# Patient Record
Sex: Female | Born: 1945 | Race: White | Hispanic: No | Smoking: Former smoker
Health system: Southern US, Community
[De-identification: ages and names within clinical notes are randomized; demographics above are authoritative.]

## PROBLEM LIST (undated history)

## (undated) DIAGNOSIS — Z8701 Personal history of pneumonia (recurrent): Secondary | ICD-10-CM

## (undated) DIAGNOSIS — J449 Chronic obstructive pulmonary disease, unspecified: Secondary | ICD-10-CM

## (undated) DIAGNOSIS — K5790 Diverticulosis of intestine, part unspecified, without perforation or abscess without bleeding: Secondary | ICD-10-CM

## (undated) DIAGNOSIS — F419 Anxiety disorder, unspecified: Secondary | ICD-10-CM

## (undated) DIAGNOSIS — K5792 Diverticulitis of intestine, part unspecified, without perforation or abscess without bleeding: Secondary | ICD-10-CM

## (undated) DIAGNOSIS — J439 Emphysema, unspecified: Secondary | ICD-10-CM

## (undated) DIAGNOSIS — K8020 Calculus of gallbladder without cholecystitis without obstruction: Secondary | ICD-10-CM

## (undated) DIAGNOSIS — G4733 Obstructive sleep apnea (adult) (pediatric): Secondary | ICD-10-CM

## (undated) DIAGNOSIS — K635 Polyp of colon: Secondary | ICD-10-CM

## (undated) DIAGNOSIS — G8929 Other chronic pain: Secondary | ICD-10-CM

## (undated) DIAGNOSIS — E7800 Pure hypercholesterolemia, unspecified: Secondary | ICD-10-CM

## (undated) DIAGNOSIS — I10 Essential (primary) hypertension: Secondary | ICD-10-CM

## (undated) DIAGNOSIS — K219 Gastro-esophageal reflux disease without esophagitis: Secondary | ICD-10-CM

## (undated) DIAGNOSIS — J40 Bronchitis, not specified as acute or chronic: Secondary | ICD-10-CM

## (undated) HISTORY — PX: COLONOSCOPY: SHX5424

## (undated) HISTORY — PX: CARPAL TUNNEL RELEASE: SHX101

## (undated) HISTORY — DX: Personal history of pneumonia (recurrent): Z87.01

## (undated) HISTORY — PX: CHOLECYSTECTOMY: SHX55

## (undated) HISTORY — PX: OTHER SURGICAL HISTORY: SHX169

## (undated) HISTORY — PX: APPENDECTOMY: SHX54

## (undated) HISTORY — DX: Diverticulitis of intestine, part unspecified, without perforation or abscess without bleeding: K57.92

## (undated) HISTORY — DX: Emphysema, unspecified: J43.9

## (undated) HISTORY — DX: Pure hypercholesterolemia, unspecified: E78.00

## (undated) HISTORY — DX: Polyp of colon: K63.5

## (undated) HISTORY — PX: ROTATOR CUFF REPAIR: SHX139

## (undated) HISTORY — DX: Calculus of gallbladder without cholecystitis without obstruction: K80.20

## (undated) HISTORY — DX: Obstructive sleep apnea (adult) (pediatric): G47.33

## (undated) HISTORY — DX: Diverticulosis of intestine, part unspecified, without perforation or abscess without bleeding: K57.90

## (undated) HISTORY — PX: FRACTURE SURGERY: SHX138

## (undated) HISTORY — PX: CATARACT EXTRACTION: SUR2

## (undated) HISTORY — PX: ESOPHAGOGASTRODUODENOSCOPY: SHX1529

## (undated) HISTORY — PX: ABDOMINAL HYSTERECTOMY: SHX81

## (undated) HISTORY — DX: Gastro-esophageal reflux disease without esophagitis: K21.9

---

## 1998-06-09 ENCOUNTER — Emergency Department (HOSPITAL_COMMUNITY): Admission: EM | Admit: 1998-06-09 | Discharge: 1998-06-09 | Payer: Self-pay | Admitting: Emergency Medicine

## 1998-08-10 ENCOUNTER — Ambulatory Visit (HOSPITAL_COMMUNITY): Admission: RE | Admit: 1998-08-10 | Discharge: 1998-08-10 | Payer: Self-pay | Admitting: Sports Medicine

## 1998-08-10 ENCOUNTER — Encounter: Payer: Self-pay | Admitting: Sports Medicine

## 1998-12-19 ENCOUNTER — Ambulatory Visit (HOSPITAL_COMMUNITY): Admission: RE | Admit: 1998-12-19 | Discharge: 1998-12-19 | Payer: Self-pay | Admitting: Sports Medicine

## 1998-12-20 ENCOUNTER — Ambulatory Visit (HOSPITAL_COMMUNITY): Admission: RE | Admit: 1998-12-20 | Discharge: 1998-12-20 | Payer: Self-pay | Admitting: Sports Medicine

## 1998-12-20 ENCOUNTER — Encounter: Payer: Self-pay | Admitting: Sports Medicine

## 1999-05-03 ENCOUNTER — Ambulatory Visit (HOSPITAL_BASED_OUTPATIENT_CLINIC_OR_DEPARTMENT_OTHER): Admission: RE | Admit: 1999-05-03 | Discharge: 1999-05-03 | Payer: Self-pay | Admitting: Orthopedic Surgery

## 1999-05-10 ENCOUNTER — Encounter: Admission: RE | Admit: 1999-05-10 | Discharge: 1999-08-08 | Payer: Self-pay | Admitting: Orthopedic Surgery

## 1999-08-05 ENCOUNTER — Encounter: Admission: RE | Admit: 1999-08-05 | Discharge: 1999-11-03 | Payer: Self-pay | Admitting: Orthopedic Surgery

## 1999-08-22 ENCOUNTER — Encounter: Payer: Self-pay | Admitting: Orthopedic Surgery

## 1999-08-22 ENCOUNTER — Ambulatory Visit (HOSPITAL_COMMUNITY): Admission: RE | Admit: 1999-08-22 | Discharge: 1999-08-22 | Payer: Self-pay | Admitting: Orthopedic Surgery

## 1999-11-22 ENCOUNTER — Encounter: Admission: RE | Admit: 1999-11-22 | Discharge: 1999-12-20 | Payer: Self-pay | Admitting: Orthopedic Surgery

## 2000-09-01 ENCOUNTER — Emergency Department (HOSPITAL_COMMUNITY): Admission: EM | Admit: 2000-09-01 | Discharge: 2000-09-01 | Payer: Self-pay | Admitting: Emergency Medicine

## 2000-09-04 ENCOUNTER — Emergency Department (HOSPITAL_COMMUNITY): Admission: EM | Admit: 2000-09-04 | Discharge: 2000-09-04 | Payer: Self-pay | Admitting: Emergency Medicine

## 2002-04-12 ENCOUNTER — Encounter: Payer: Self-pay | Admitting: Emergency Medicine

## 2002-04-12 ENCOUNTER — Inpatient Hospital Stay (HOSPITAL_COMMUNITY): Admission: EM | Admit: 2002-04-12 | Discharge: 2002-04-17 | Payer: Self-pay | Admitting: Emergency Medicine

## 2002-04-15 ENCOUNTER — Encounter: Payer: Self-pay | Admitting: Internal Medicine

## 2002-04-16 ENCOUNTER — Encounter: Payer: Self-pay | Admitting: Internal Medicine

## 2002-04-24 ENCOUNTER — Encounter: Payer: Self-pay | Admitting: Emergency Medicine

## 2002-04-24 ENCOUNTER — Emergency Department (HOSPITAL_COMMUNITY): Admission: EM | Admit: 2002-04-24 | Discharge: 2002-04-25 | Payer: Self-pay | Admitting: Emergency Medicine

## 2008-10-02 DIAGNOSIS — G4733 Obstructive sleep apnea (adult) (pediatric): Secondary | ICD-10-CM | POA: Insufficient documentation

## 2011-12-14 ENCOUNTER — Inpatient Hospital Stay (HOSPITAL_COMMUNITY)
Admission: EM | Admit: 2011-12-14 | Discharge: 2011-12-18 | DRG: 190 | Disposition: A | Discharge status: Home / Self Care | Payer: Medicaid Other | Attending: Internal Medicine | Admitting: Internal Medicine

## 2011-12-14 ENCOUNTER — Emergency Department (HOSPITAL_COMMUNITY): Payer: Medicaid Other

## 2011-12-14 ENCOUNTER — Encounter (HOSPITAL_COMMUNITY): Payer: Self-pay

## 2011-12-14 ENCOUNTER — Other Ambulatory Visit: Payer: Self-pay

## 2011-12-14 DIAGNOSIS — J9600 Acute respiratory failure, unspecified whether with hypoxia or hypercapnia: Secondary | ICD-10-CM | POA: Diagnosis present

## 2011-12-14 DIAGNOSIS — J40 Bronchitis, not specified as acute or chronic: Secondary | ICD-10-CM | POA: Diagnosis present

## 2011-12-14 DIAGNOSIS — Z9660 Presence of unspecified orthopedic joint implant: Secondary | ICD-10-CM

## 2011-12-14 DIAGNOSIS — J441 Chronic obstructive pulmonary disease with (acute) exacerbation: Secondary | ICD-10-CM

## 2011-12-14 DIAGNOSIS — R0902 Hypoxemia: Secondary | ICD-10-CM

## 2011-12-14 DIAGNOSIS — F411 Generalized anxiety disorder: Secondary | ICD-10-CM | POA: Diagnosis present

## 2011-12-14 DIAGNOSIS — I10 Essential (primary) hypertension: Secondary | ICD-10-CM | POA: Diagnosis present

## 2011-12-14 DIAGNOSIS — D72829 Elevated white blood cell count, unspecified: Secondary | ICD-10-CM | POA: Diagnosis present

## 2011-12-14 DIAGNOSIS — E119 Type 2 diabetes mellitus without complications: Secondary | ICD-10-CM | POA: Diagnosis present

## 2011-12-14 DIAGNOSIS — F17200 Nicotine dependence, unspecified, uncomplicated: Secondary | ICD-10-CM | POA: Diagnosis present

## 2011-12-14 DIAGNOSIS — J479 Bronchiectasis, uncomplicated: Secondary | ICD-10-CM | POA: Diagnosis present

## 2011-12-14 DIAGNOSIS — G8929 Other chronic pain: Secondary | ICD-10-CM | POA: Diagnosis present

## 2011-12-14 HISTORY — DX: Other chronic pain: G89.29

## 2011-12-14 HISTORY — DX: Anxiety disorder, unspecified: F41.9

## 2011-12-14 HISTORY — DX: Chronic obstructive pulmonary disease, unspecified: J44.9

## 2011-12-14 HISTORY — DX: Essential (primary) hypertension: I10

## 2011-12-14 LAB — BASIC METABOLIC PANEL
BUN: 11 mg/dL (ref 6–23)
CO2: 27 mEq/L (ref 19–32)
Calcium: 9.1 mg/dL (ref 8.4–10.5)
Creatinine, Ser: 0.81 mg/dL (ref 0.50–1.10)
Glucose, Bld: 100 mg/dL — ABNORMAL HIGH (ref 70–99)

## 2011-12-14 LAB — CBC
MCH: 29.9 pg (ref 26.0–34.0)
MCHC: 33.8 g/dL (ref 30.0–36.0)
MCV: 88.7 fL (ref 78.0–100.0)
Platelets: 201 10*3/uL (ref 150–400)
RBC: 5.31 MIL/uL — ABNORMAL HIGH (ref 3.87–5.11)
RDW: 13 % (ref 11.5–15.5)

## 2011-12-14 LAB — CARDIAC PANEL(CRET KIN+CKTOT+MB+TROPI): Total CK: 57 U/L (ref 7–177)

## 2011-12-14 LAB — DIFFERENTIAL
Basophils Relative: 0 % (ref 0–1)
Eosinophils Absolute: 0.3 10*3/uL (ref 0.0–0.7)
Eosinophils Relative: 2 % (ref 0–5)
Lymphs Abs: 2.6 10*3/uL (ref 0.7–4.0)

## 2011-12-14 MED ORDER — ACETAMINOPHEN 650 MG RE SUPP: 650.0000 mg | Freq: Four times a day (QID) | RECTAL | Status: DC | PRN

## 2011-12-14 MED ORDER — ALBUTEROL SULFATE HFA 108 (90 BASE) MCG/ACT IN AERS
2.0000 | INHALATION_SPRAY | Freq: Four times a day (QID) | RESPIRATORY_TRACT | Status: DC | PRN
  Filled 2011-12-14: qty 6.7

## 2011-12-14 MED ORDER — ONDANSETRON HCL 4 MG/2ML IJ SOLN: 4.0000 mg | Freq: Four times a day (QID) | INTRAMUSCULAR | Status: DC | PRN

## 2011-12-14 MED ORDER — ACETAMINOPHEN 325 MG PO TABS: 650.0000 mg | ORAL_TABLET | Freq: Four times a day (QID) | ORAL | Status: DC | PRN

## 2011-12-14 MED ORDER — ALBUTEROL SULFATE (5 MG/ML) 0.5% IN NEBU
10.0000 mg | INHALATION_SOLUTION | Freq: Once | RESPIRATORY_TRACT | Status: AC
  Administered 2011-12-14: 10 mg via RESPIRATORY_TRACT
  Filled 2011-12-14: qty 2

## 2011-12-14 MED ORDER — SODIUM CHLORIDE 0.9 % IV BOLUS (SEPSIS)
1000.0000 mL | Freq: Once | INTRAVENOUS | Status: AC
  Administered 2011-12-14: 1000 mL via INTRAVENOUS

## 2011-12-14 MED ORDER — SODIUM CHLORIDE 0.9 % IV SOLN
INTRAVENOUS | Status: DC
  Administered 2011-12-14: 19:00:00 via INTRAVENOUS

## 2011-12-14 MED ORDER — PNEUMOCOCCAL VAC POLYVALENT 25 MCG/0.5ML IJ INJ
0.5000 mL | INJECTION | INTRAMUSCULAR | Status: AC
  Administered 2011-12-15: 0.5 mL via INTRAMUSCULAR
  Filled 2011-12-14 (×2): qty 0.5

## 2011-12-14 MED ORDER — INSULIN ASPART 100 UNIT/ML ~~LOC~~ SOLN
0.0000 [IU] | Freq: Three times a day (TID) | SUBCUTANEOUS | Status: DC
  Administered 2011-12-15: 5 [IU] via SUBCUTANEOUS
  Administered 2011-12-15: 8 [IU] via SUBCUTANEOUS
  Administered 2011-12-15: 5 [IU] via SUBCUTANEOUS
  Administered 2011-12-16: 3 [IU] via SUBCUTANEOUS
  Administered 2011-12-16: 5 [IU] via SUBCUTANEOUS
  Administered 2011-12-16: 3 [IU] via SUBCUTANEOUS
  Administered 2011-12-17: 5 [IU] via SUBCUTANEOUS
  Administered 2011-12-17: 12 [IU] via SUBCUTANEOUS
  Administered 2011-12-18: 2 [IU] via SUBCUTANEOUS
  Administered 2011-12-18: 3 [IU] via SUBCUTANEOUS

## 2011-12-14 MED ORDER — HYDROCODONE-ACETAMINOPHEN 5-325 MG PO TABS
1.0000 | ORAL_TABLET | ORAL | Status: DC | PRN
  Administered 2011-12-15 (×2): 2 via ORAL
  Administered 2011-12-15: 1 via ORAL
  Filled 2011-12-14: qty 2
  Filled 2011-12-14: qty 1
  Filled 2011-12-14: qty 2

## 2011-12-14 MED ORDER — HYDROMORPHONE HCL PF 1 MG/ML IJ SOLN
1.0000 mg | Freq: Once | INTRAMUSCULAR | Status: AC
  Administered 2011-12-14: 1 mg via INTRAVENOUS
  Filled 2011-12-14: qty 1

## 2011-12-14 MED ORDER — INFLUENZA VIRUS VACC SPLIT PF IM SUSP
0.5000 mL | INTRAMUSCULAR | Status: AC
  Administered 2011-12-15: 0.5 mL via INTRAMUSCULAR
  Filled 2011-12-14 (×2): qty 0.5

## 2011-12-14 MED ORDER — ONDANSETRON HCL 4 MG/2ML IJ SOLN
4.0000 mg | Freq: Once | INTRAMUSCULAR | Status: AC
  Administered 2011-12-14: 4 mg via INTRAVENOUS
  Filled 2011-12-14: qty 2

## 2011-12-14 MED ORDER — MOXIFLOXACIN HCL IN NACL 400 MG/250ML IV SOLN
400.0000 mg | INTRAVENOUS | Status: DC
  Administered 2011-12-14 – 2011-12-15 (×2): 400 mg via INTRAVENOUS
  Filled 2011-12-14 (×3): qty 250

## 2011-12-14 MED ORDER — LISINOPRIL 20 MG PO TABS
20.0000 mg | ORAL_TABLET | Freq: Every day | ORAL | Status: DC
  Administered 2011-12-14 – 2011-12-18 (×5): 20 mg via ORAL
  Filled 2011-12-14 (×5): qty 1

## 2011-12-14 MED ORDER — ATORVASTATIN CALCIUM 10 MG PO TABS
10.0000 mg | ORAL_TABLET | Freq: Every day | ORAL | Status: DC
  Administered 2011-12-15 – 2011-12-17 (×3): 10 mg via ORAL
  Filled 2011-12-14 (×4): qty 1

## 2011-12-14 MED ORDER — TIOTROPIUM BROMIDE MONOHYDRATE 18 MCG IN CAPS
18.0000 ug | ORAL_CAPSULE | Freq: Every day | RESPIRATORY_TRACT | Status: DC
  Administered 2011-12-15 – 2011-12-17 (×3): 18 ug via RESPIRATORY_TRACT
  Filled 2011-12-14: qty 5

## 2011-12-14 MED ORDER — HEPARIN SODIUM (PORCINE) 5000 UNIT/ML IJ SOLN
5000.0000 [IU] | Freq: Three times a day (TID) | INTRAMUSCULAR | Status: DC
  Administered 2011-12-14 – 2011-12-18 (×9): 5000 [IU] via SUBCUTANEOUS
  Filled 2011-12-14 (×14): qty 1

## 2011-12-14 MED ORDER — METHYLPREDNISOLONE SODIUM SUCC 125 MG IJ SOLR
125.0000 mg | Freq: Once | INTRAMUSCULAR | Status: AC
  Administered 2011-12-14: 125 mg via INTRAVENOUS
  Filled 2011-12-14: qty 2

## 2011-12-14 MED ORDER — ONDANSETRON HCL 4 MG PO TABS: 4.0000 mg | ORAL_TABLET | Freq: Four times a day (QID) | ORAL | Status: DC | PRN

## 2011-12-14 MED ORDER — INSULIN ASPART 100 UNIT/ML ~~LOC~~ SOLN
0.0000 [IU] | Freq: Every day | SUBCUTANEOUS | Status: DC
  Administered 2011-12-14 – 2011-12-16 (×2): 3 [IU] via SUBCUTANEOUS
  Administered 2011-12-17: 4 [IU] via SUBCUTANEOUS

## 2011-12-14 MED ORDER — SODIUM CHLORIDE 0.9 % IV SOLN
INTRAVENOUS | Status: DC
  Administered 2011-12-14 – 2011-12-16 (×3): via INTRAVENOUS

## 2011-12-14 MED ORDER — LISINOPRIL-HYDROCHLOROTHIAZIDE 20-25 MG PO TABS: 1.0000 | ORAL_TABLET | Freq: Every day | ORAL | Status: DC

## 2011-12-14 MED ORDER — ALBUTEROL SULFATE (5 MG/ML) 0.5% IN NEBU
2.5000 mg | INHALATION_SOLUTION | RESPIRATORY_TRACT | Status: DC | PRN
  Administered 2011-12-15: 2.5 mg via RESPIRATORY_TRACT
  Filled 2011-12-14: qty 0.5

## 2011-12-14 MED ORDER — HYDROCHLOROTHIAZIDE 25 MG PO TABS
25.0000 mg | ORAL_TABLET | Freq: Every day | ORAL | Status: DC
  Administered 2011-12-14 – 2011-12-18 (×5): 25 mg via ORAL
  Filled 2011-12-14 (×5): qty 1

## 2011-12-14 MED ORDER — BUDESONIDE-FORMOTEROL FUMARATE 160-4.5 MCG/ACT IN AERO
2.0000 | INHALATION_SPRAY | Freq: Two times a day (BID) | RESPIRATORY_TRACT | Status: DC
  Administered 2011-12-14 – 2011-12-18 (×8): 2 via RESPIRATORY_TRACT
  Filled 2011-12-14: qty 6

## 2011-12-14 MED ORDER — POLYETHYLENE GLYCOL 3350 17 G PO PACK
17.0000 g | PACK | Freq: Every day | ORAL | Status: DC | PRN
  Filled 2011-12-14: qty 1

## 2011-12-14 MED ORDER — INSULIN ASPART 100 UNIT/ML ~~LOC~~ SOLN
4.0000 [IU] | Freq: Three times a day (TID) | SUBCUTANEOUS | Status: DC
  Administered 2011-12-15 – 2011-12-18 (×11): 4 [IU] via SUBCUTANEOUS

## 2011-12-14 MED ORDER — INSULIN GLARGINE 100 UNIT/ML ~~LOC~~ SOLN
10.0000 [IU] | Freq: Every day | SUBCUTANEOUS | Status: DC
  Administered 2011-12-14 – 2011-12-17 (×4): 10 [IU] via SUBCUTANEOUS

## 2011-12-14 MED ORDER — ALPRAZOLAM 0.5 MG PO TABS
0.5000 mg | ORAL_TABLET | Freq: Three times a day (TID) | ORAL | Status: DC | PRN
  Administered 2011-12-14 – 2011-12-17 (×3): 0.5 mg via ORAL
  Filled 2011-12-14 (×3): qty 1

## 2011-12-14 MED ORDER — GUAIFENESIN-DM 100-10 MG/5ML PO SYRP
5.0000 mL | ORAL_SOLUTION | ORAL | Status: DC | PRN
  Filled 2011-12-14: qty 10

## 2011-12-14 MED ORDER — METHYLPREDNISOLONE SODIUM SUCC 125 MG IJ SOLR
80.0000 mg | Freq: Three times a day (TID) | INTRAMUSCULAR | Status: DC
  Administered 2011-12-14 – 2011-12-15 (×3): 80 mg via INTRAVENOUS
  Filled 2011-12-14 (×5): qty 1.28

## 2011-12-14 NOTE — ED Notes (Signed)
EKG done in [place]

## 2011-12-14 NOTE — H&P (Signed)
Hospital Admission Note Date: 12/14/2011  PCP: No primary provider on file.  Chief Complaint: Cough and SOB  History of Present Illness: This is a 66 year old female with past medical history of diabetes, hypertension, COPD and ongoing tobacco abuse that comes in for 1 day of cough and fever. She relates that her grandson was sick which was visiting over the weekend. Then she started feeling short of breath 1-2 days prior to admission. She also started developing a productive cough that is not changing color. She had no coughing up blood. And then developed cough and fever has progressively gotten worse. She did not take her temperature at home. She had to use more blankets than usual. She felt really cold. She denies any chest pain, nausea, vomiting, diarrhea. He in the ED she was satting 90% breathing 32 times per minute on room air so we were asked to admit and further evaluate.  Allergies: Codeine; Penicillins; Prednisone; and Sulfa antibiotics Past Medical History  Diagnosis Date  . Diabetes mellitus   . Hypertension   . COPD (chronic obstructive pulmonary disease)   . Bronchiectasis   . Chronic pain   . Anxiety    Prior to Admission medications   Medication Sig Start Date End Date Taking? Authorizing Provider  albuterol (PROVENTIL HFA;VENTOLIN HFA) 108 (90 BASE) MCG/ACT inhaler Inhale 2 puffs into the lungs every 6 (six) hours as needed. For shortness of breath.   Yes Historical Provider, MD  ALPRAZolam Prudy Feeler) 0.5 MG tablet Take 0.5 mg by mouth 3 (three) times daily as needed. For anxiety.   Yes Historical Provider, MD  budesonide-formoterol (SYMBICORT) 160-4.5 MCG/ACT inhaler Inhale 2 puffs into the lungs 2 (two) times daily.   Yes Historical Provider, MD  lisinopril-hydrochlorothiazide (PRINZIDE,ZESTORETIC) 20-25 MG per tablet Take 1 tablet by mouth daily.   Yes Historical Provider, MD  metFORMIN (GLUCOPHAGE) 500 MG tablet Take 1,000 mg by mouth 2 (two) times daily with a meal.    Yes Historical Provider, MD  rosuvastatin (CRESTOR) 10 MG tablet Take 10 mg by mouth daily.   Yes Historical Provider, MD  tiotropium (SPIRIVA) 18 MCG inhalation capsule Place 18 mcg into inhaler and inhale daily.   Yes Historical Provider, MD   Past Surgical History  Procedure Date  . Appendectomy   . Cholecystectomy   . Abdominal hysterectomy   . Joint replacement   . Fracture surgery    Family History  Problem Relation Age of Onset  . Heart attack Mother    History   Social History  . Marital Status: Divorced    Spouse Name: N/A    Number of Children: N/A  . Years of Education: N/A   Occupational History  . Not on file.   Social History Main Topics  . Smoking status: Current Everyday Smoker -- 1.5 packs/day for 39 years    Types: Cigarettes  . Smokeless tobacco: Never Used  . Alcohol Use: No  . Drug Use: No  . Sexually Active: No   Other Topics Concern  . Not on file   Social History Narrative  . No narrative on file    REVIEW OF SYSTEMS:  Constitutional:  No weight loss, night sweats, Fevers, chills, fatigue.  HEENT:  No headaches, Difficulty swallowing,Tooth/dental problems,Sore throat,  No sneezing, itching, ear ache, nasal congestion, post nasal drip,  Cardio-vascular:  No chest pain, Orthopnea, PND, swelling in lower extremities, anasarca, dizziness, palpitations  GI:  No heartburn, indigestion, abdominal pain, nausea, vomiting, diarrhea, change in bowel habits, loss  of appetite  Resp:  .No change in color of mucus.No wheezing.No chest wall deformity  Skin:  no rash or lesions.  GU:  no dysuria, change in color of urine, no urgency or frequency. No flank pain.  Musculoskeletal:  No joint pain or swelling. No decreased range of motion. No back pain.  Psych:  No change in mood or affect. No depression or anxiety. No memory loss.   Physical Exam: Filed Vitals:   12/14/11 1553 12/14/11 1557 12/14/11 1722  BP: 138/60    Pulse: 91    Temp: 98.3 F  (36.8 C)    Resp: 32    SpO2: 90% 96% 97%   No intake or output data in the 24 hours ending 12/14/11 1822 BP 138/60  Pulse 91  Temp 98.3 F (36.8 C)  Resp 32  SpO2 97%  General Appearance:    Alert, cooperative, no distress, appears stated age  Head:    Normocephalic, without obvious abnormality, atraumatic  Eyes:    PERRL, conjunctiva/corneas clear, EOM's intact, fundi    benign, both eyes  Ears:    Normal TM's and external ear canals, both ears  Nose:   Nares normal, septum midline, mucosa normal, no drainage    or sinus tenderness  Throat:   Lips, mucosa, and tongue normal; teeth and gums normal  Neck:   Supple, symmetrical, trachea midline, no adenopathy;    thyroid:  no enlargement/tenderness/nodules; no carotid   bruit or JVD  Back:     Symmetric, no curvature, ROM normal, no CVA tenderness  Lungs:     poor air movement movement, with bilateral wheezing.   Chest Wall:    No tenderness or deformity   Heart:    Regular rate and rhythm, S1 and S2 normal, no murmur, rub   or gallop     Abdomen:     Soft, non-tender, bowel sounds active all four quadrants,    no masses, no organomegaly        Extremities:   Extremities normal, atraumatic, no cyanosis or edema  Pulses:   2+ and symmetric all extremities  Skin:   Skin color, texture, turgor normal, no rashes or lesions  Lymph nodes:   Cervical, supraclavicular, and axillary nodes normal  Neurologic:   CNII-XII intact, normal strength, sensation and reflexes    throughout   Lab results:  Basename 12/14/11 1650  NA 139  K 4.1  CL 105  CO2 27  GLUCOSE 100*  BUN 11  CREATININE 0.81  CALCIUM 9.1  MG --  PHOS --   No results found for this basename: AST:2,ALT:2,ALKPHOS:2,BILITOT:2,PROT:2,ALBUMIN:2 in the last 72 hours No results found for this basename: LIPASE:2,AMYLASE:2 in the last 72 hours  Basename 12/14/11 1650  WBC 13.3*  NEUTROABS 9.4*  HGB 15.9*  HCT 47.1*  MCV 88.7  PLT 201    Basename 12/14/11 1656    CKTOTAL 57  CKMB 1.9  CKMBINDEX --  TROPONINI <0.30   No components found with this basename: POCBNP:3 No results found for this basename: DDIMER:2 in the last 72 hours No results found for this basename: HGBA1C:2 in the last 72 hours No results found for this basename: CHOL:2,HDL:2,LDLCALC:2,TRIG:2,CHOLHDL:2,LDLDIRECT:2 in the last 72 hours No results found for this basename: TSH,T4TOTAL,FREET3,T3FREE,THYROIDAB in the last 72 hours No results found for this basename: VITAMINB12:2,FOLATE:2,FERRITIN:2,TIBC:2,IRON:2,RETICCTPCT:2 in the last 72 hours Imaging results:  Dg Chest 2 View  12/14/2011  *RADIOLOGY REPORT*  Clinical Data: Cough, shortness of breath.  CHEST - 2 VIEW  Comparison: None.  Findings: There is hyperinflation of the lungs compatible with COPD.  Peribronchial thickening.  Heart is normal size.  No confluent opacities or effusions.  No acute bony abnormality.  IMPRESSION: COPD/chronic bronchitic changes.  Original Report Authenticated By: Cyndie Chime, M.D.   Other results: EKG: normal EKG, normal sinus rhythm.   Patient Active Hospital Problem List: -Respiratory failure, acute (12/14/2011)  we'll continue her on oxygen. Here in the ED after putting her on 4 L of oxygen her respirations slowed down to 16 and her saturation has remained stable above 90%. We'll continue her on oxygen and continue inhalers as tolerated. This probably secondary to her COPD exacerbation.  -COPD with acute exacerbation (12/14/2011)  she has never been intubated, her last exacerbation was more than 2 years ago. She does have a white count. We'll start her on IV Avelox, IV steroids, continue her Spiriva from home, continue her Symbicort in at all. We'll schedule and when necessary. Her chest x-ray does not show any infiltrates. We'll hydrate her with IV fluids and continue to monitor her.   -Leukocytosis (12/14/2011)  is most likely secondary to an infectious process questionable bronchitis as she  does not have an infiltrate on chest x-ray she did report a fever with chills. We'll cover empirically Avelox.  -Diabetes mellitus (12/14/2011)  will DC her metformin and start her on Lantus and sliding scale. Her sugars are going to go up because she is on steroids. We'll also check a hemoglobin A1c.   -Hypertension (12/14/2011)  continue her home medications no changes were made her blood pressures currently well controlled.    Code Status: Full code Family Communication:  Daughter [phone]   Marinda Elk M.D. [facility] [phone] 12/14/2011, 6:22 PM

## 2011-12-14 NOTE — ED Notes (Signed)
Pt c/o increase SOB in the past wk, states has been coughing until she vomits; pts sats 92 on ra

## 2011-12-14 NOTE — Plan of Care (Signed)
Problem: Consults Goal: Nutrition Consult-if indicated Outcome: Not Met (add Reason) Pt given some education on diabetic diet

## 2011-12-14 NOTE — Plan of Care (Signed)
Problem: Consults Goal: General Medical Patient Education See Patient Education Module for specific education. Outcome: Progressing Copd/sob/bronchitis

## 2011-12-14 NOTE — ED Provider Notes (Signed)
History     CSN: [id number]  Arrival date & time 12/14/11  1545   First MD Initiated Contact with Patient 12/14/11 [phone]      Chief Complaint  Patient presents with  . Shortness of Breath  cough, SOB  PCP Misty Buchanan in [place]  (Consider location/radiation/quality/duration/timing/severity/associated sxs/prior treatment) HPI This 66 year old female has a few days of cough nasal congestion and generalized body aches shortness breath and wheezing not responsive to her inhalers at home, she is not on oxygen at home, she does have generalized weakness and generalized body aches, she coughs so hard she has had multiple episodes of posttussive emesis which is nonbloody, she is in no confusion no fever no exertional chest pain his chest pain when she coughs twists her torso, she is no edema no abdominal pain no diarrhea no confusion. Her treatment prior to arrival has been albuterol at home without improvement. She is mildly hypoxic on room arrival at 89%. She states she has not had a COPD exacerbation in over 2 years. Past Medical History  Diagnosis Date  . Diabetes mellitus   . Hypertension   . COPD (chronic obstructive pulmonary disease)   . Bronchiectasis   . Chronic pain   . Anxiety    COPD, no home O2 Past Surgical History  Procedure Date  . Appendectomy   . Cholecystectomy   . Abdominal hysterectomy   . Joint replacement   . Fracture surgery     Family History  Problem Relation Age of Onset  . Heart attack Mother     History  Substance Use Topics  . Smoking status: Current Everyday Smoker -- 1.5 packs/day for 39 years    Types: Cigarettes  . Smokeless tobacco: Never Used  . Alcohol Use: No  still smokes  OB History    Grav Para Term Preterm Abortions TAB SAB Ect Mult Living                  Review of Systems  Constitutional: Positive for fatigue. Negative for fever.       10 Systems reviewed and are negative for acute change except as noted in the HPI.    HENT: Negative for congestion.   Eyes: Negative for discharge and redness.  Respiratory: Positive for cough, shortness of breath and wheezing.   Cardiovascular: Negative for chest pain and leg swelling.  Gastrointestinal: Positive for vomiting. Negative for abdominal pain.  Musculoskeletal: Positive for myalgias and arthralgias. Negative for back pain.  Skin: Negative for rash.  Neurological: Positive for weakness. Negative for syncope, numbness and headaches.  Psychiatric/Behavioral:       No behavior change.    Allergies  Codeine; Penicillins; Prednisone; and Sulfa antibiotics  Home Medications  No current outpatient prescriptions on file.  BP 145/61  Pulse 70  Temp(Src) 98.3 F (36.8 C) (Oral)  Resp 20  Ht 5\' 3"  (1.6 m)  Wt 165 lb 14.4 oz (75.252 kg)  BMI 29.39 kg/m2  SpO2 92%  Physical Exam  Nursing note and vitals reviewed. Constitutional:       Awake, alert, nontoxic appearance.  HENT:  Head: Atraumatic.  Eyes: Pupils are equal, round, and reactive to light. Right eye exhibits no discharge. Left eye exhibits no discharge.  Neck: Neck supple.  Cardiovascular: Normal rate and regular rhythm.   No murmur heard. Pulmonary/Chest: She is in respiratory distress. She has wheezes. She has no rales. She exhibits no tenderness.       Tachypnea, minimal retractions, diffuse wheezes,  no accessory muscle usage, subjective fatigue per the patient with minimal exertion, speaks phrases in short sentences at rest with mild-moderate respiratory distress upon arrival  Abdominal: Soft. There is no tenderness. There is no rebound.  Musculoskeletal: She exhibits no edema and no tenderness.       Baseline ROM, no obvious new focal weakness.  Neurological:       Mental status and motor strength appears baseline for patient and situation.  Skin: No rash noted.  Psychiatric: She has a normal mood and affect.    ED Course  Procedures (including critical care time) Pt stable in ED with  no significant deterioration in condition.Patient / Family / Caregiver informed of clinical course, understand medical decision-making process, and agree with plan. Labs Reviewed  BASIC METABOLIC PANEL - Abnormal; Notable for the following:    Glucose, Bld 100 (*)    GFR calc non Af Amer 75 (*)    GFR calc Af Amer 87 (*)    All other components within normal limits  CBC - Abnormal; Notable for the following:    WBC 13.3 (*)    RBC 5.31 (*)    Hemoglobin 15.9 (*)    HCT 47.1 (*)    All other components within normal limits  DIFFERENTIAL - Abnormal; Notable for the following:    Neutro Abs 9.4 (*)    All other components within normal limits  COMPREHENSIVE METABOLIC PANEL - Abnormal; Notable for the following:    Glucose, Bld 215 (*)    Albumin 3.0 (*)    GFR calc non Af Amer 56 (*)    GFR calc Af Amer 64 (*)    All other components within normal limits  HEMOGLOBIN A1C - Abnormal; Notable for the following:    Hemoglobin A1C 5.7 (*)    Mean Plasma Glucose 117 (*)    All other components within normal limits  GLUCOSE, CAPILLARY - Abnormal; Notable for the following:    Glucose-Capillary 263 (*)    All other components within normal limits  GLUCOSE, CAPILLARY - Abnormal; Notable for the following:    Glucose-Capillary 201 (*)    All other components within normal limits  GLUCOSE, CAPILLARY - Abnormal; Notable for the following:    Glucose-Capillary 203 (*)    All other components within normal limits  GLUCOSE, CAPILLARY - Abnormal; Notable for the following:    Glucose-Capillary 290 (*)    All other components within normal limits  GLUCOSE, CAPILLARY - Abnormal; Notable for the following:    Glucose-Capillary 170 (*)    All other components within normal limits  CARDIAC PANEL(CRET KIN+CKTOT+MB+TROPI)  CBC   Dg Chest 2 View  12/14/2011  *RADIOLOGY REPORT*  Clinical Data: Cough, shortness of breath.  CHEST - 2 VIEW  Comparison: None.  Findings: There is hyperinflation of the  lungs compatible with COPD.  Peribronchial thickening.  Heart is normal size.  No confluent opacities or effusions.  No acute bony abnormality.  IMPRESSION: COPD/chronic bronchitic changes.  Original Report Authenticated By: Cyndie Chime, M.D.     1. COPD exacerbation   2. Hypoxia       MDM  The patient appears reasonably stabilized for admission considering the current resources, flow, and capabilities available in the ED at this time, and I doubt any other [facility] requiring further screening and/or treatment in the ED prior to admission.        Hurman Horn, MD 12/15/11 [phone]

## 2011-12-14 NOTE — ED Notes (Signed)
Pt started having shortness of breath/cough/vomiting yesterday am, chest pain started today "because I have been coughing so much" per the family member.  Pt c/o chest, rt arm and gen pain, pt states that she is "tired of being in pain."

## 2011-12-15 LAB — COMPREHENSIVE METABOLIC PANEL
Albumin: 3 g/dL — ABNORMAL LOW (ref 3.5–5.2)
Alkaline Phosphatase: 65 U/L (ref 39–117)
BUN: 13 mg/dL (ref 6–23)
Potassium: 4 mEq/L (ref 3.5–5.1)
Sodium: 139 mEq/L (ref 135–145)
Total Protein: 6.6 g/dL (ref 6.0–8.3)

## 2011-12-15 LAB — CBC
HCT: 43 % (ref 36.0–46.0)
MCV: 89 fL (ref 78.0–100.0)
RDW: 12.9 % (ref 11.5–15.5)
WBC: 10.3 10*3/uL (ref 4.0–10.5)

## 2011-12-15 LAB — GLUCOSE, CAPILLARY
Glucose-Capillary: 263 mg/dL — ABNORMAL HIGH (ref 70–99)
Glucose-Capillary: 203 mg/dL — ABNORMAL HIGH (ref 70–99)
Glucose-Capillary: 290 mg/dL — ABNORMAL HIGH (ref 70–99)

## 2011-12-15 MED ORDER — ENSURE CLINICAL ST REVIGOR PO LIQD
237.0000 mL | Freq: Two times a day (BID) | ORAL | Status: DC
  Administered 2011-12-15 – 2011-12-18 (×5): 237 mL via ORAL

## 2011-12-15 MED ORDER — PREDNISONE 50 MG PO TABS
50.0000 mg | ORAL_TABLET | Freq: Every day | ORAL | Status: DC
  Administered 2011-12-16 – 2011-12-18 (×3): 50 mg via ORAL
  Filled 2011-12-15 (×3): qty 1

## 2011-12-15 NOTE — Progress Notes (Signed)
INITIAL ADULT NUTRITION ASSESSMENT Date: 12/15/2011   Time: 2:30 PM Reason for Assessment: Nutrition risk   ASSESSMENT: Female 66 y.o.  Dx: Cough and shortness of breath  Food/Nutrition Related Hx: Pt reports that for the past 2 months she has had poor appetite which has been worsening in the past 2 weeks with coughing and vomiting. Pt states that every time she tried to eat or drink anything she would vomit it up within 30 minutes. Pt reports 10 pound unintended weight loss in the past 2 months. Pt also reports history of dysphagia which she has had for years and has been followed by a "digestive doctor" for this problem. Pt states she occasionally gets choked on food or liquids but states she finds it more embarrassing than anything else. Pt states she is not on a special diet for this issue or thickened liquids. Pt states she vomits anytime she eats or drinks anything with artificial sweeteners. Pt states her blood sugars have been good at home. Pt states when she does eat it is 1 small meal at night. Pt states if she eats a large meal then she will have diarrhea the next day. Pt denies any vomiting or diarrhea today. Pt still with cough.   Hx: Past Medical History  Diagnosis Date  . Diabetes mellitus   . Hypertension   . COPD (chronic obstructive pulmonary disease)   . Bronchiectasis   . Chronic pain   . Anxiety    Related Meds:  Scheduled Meds:   . albuterol  10 mg Nebulization Once  . atorvastatin  10 mg Oral q1800  . budesonide-formoterol  2 puff Inhalation BID  . heparin  5,000 Units Subcutaneous Q8H  . lisinopril  20 mg Oral Daily   And  . hydrochlorothiazide  25 mg Oral Daily  .  HYDROmorphone (DILAUDID) injection  1 mg Intravenous Once  . influenza  inactive virus vaccine  0.5 mL Intramuscular Tomorrow-1000  . insulin aspart  0-15 Units Subcutaneous TID WC  . insulin aspart  0-5 Units Subcutaneous QHS  . insulin aspart  4 Units Subcutaneous TID WC  . insulin glargine   10 Units Subcutaneous QHS  . methylPREDNISolone (SOLU-MEDROL) injection  125 mg Intravenous Once  . moxifloxacin  400 mg Intravenous Q24H  . ondansetron (ZOFRAN) IV  4 mg Intravenous Once  . pneumococcal 23 valent vaccine  0.5 mL Intramuscular Tomorrow-1000  . predniSONE  50 mg Oral Q breakfast  . sodium chloride  1,000 mL Intravenous Once  . tiotropium  18 mcg Inhalation Daily  . DISCONTD: lisinopril-hydrochlorothiazide  1 tablet Oral Daily  . DISCONTD: methylPREDNISolone (SOLU-MEDROL) injection  80 mg Intravenous Q8H   Continuous Infusions:   . sodium chloride 100 mL/hr at 12/15/11 0532  . DISCONTD: sodium chloride 125 mL/hr at 12/14/11 1900   PRN Meds:.acetaminophen, acetaminophen, albuterol, albuterol, ALPRAZolam, guaiFENesin-dextromethorphan, HYDROcodone-acetaminophen, ondansetron (ZOFRAN) IV, ondansetron, polyethylene glycol  Ht: 5\' 3"  (160 cm)  Wt: 165 lb 14.4 oz (75.252 kg)  Ideal Wt: 115 lb % Ideal Wt: 143  Usual Wt: 175 lb % Usual Wt: 94  Body mass index is 29.39 kg/(m^2).  Labs:  CMP     Component Value Date/Time   NA 139 12/15/2011 0431   K 4.0 12/15/2011 0431   CL 105 12/15/2011 0431   CO2 24 12/15/2011 0431   GLUCOSE 215* 12/15/2011 0431   BUN 13 12/15/2011 0431   CREATININE 1.04 12/15/2011 0431   CALCIUM 8.6 12/15/2011 0431   PROT 6.6 12/15/2011  0431   ALBUMIN 3.0* 12/15/2011 0431   AST 11 12/15/2011 0431   ALT 14 12/15/2011 0431   ALKPHOS 65 12/15/2011 0431   BILITOT 0.5 12/15/2011 0431   GFRNONAA 56* 12/15/2011 0431   GFRAA 64* 12/15/2011 0431   CBG (last 3)   Basename 12/14/11 2143  GLUCAP 263*   Lab Results  Component Value Date   HGBA1C 5.7* 12/14/2011    Intake/Output Summary (Last 24 hours) at 12/15/11 1438 Last data filed at 12/14/11 2318  Gross per 24 hour  Intake    365 ml  Output    200 ml  Net    165 ml   Last BM -  Likely PTA as none recorded   Diet Order: Carb Control   IVF:    sodium chloride Last Rate: 100 mL/hr at 12/15/11 0532   DISCONTD: sodium chloride Last Rate: 125 mL/hr at 12/14/11 1900    Estimated Nutritional Needs:   Kcal:1900-2250 Protein:90-115g Fluid:1.9-2.2L  NUTRITION DIAGNOSIS: -Inadequate oral intake (NI-2.1).  Status: Ongoing -Pt meets criteria for severe PCM of acute illness AEB 5.7% weight loss in the past 2 months with <50% energy intake in the past 2 weeks and pt verbalized significant decreased muscle strength   RELATED TO: coughing, vomiting, poor appetite  AS EVIDENCE BY: pt statement, weight loss PTA  MONITORING/EVALUATION(Goals): Pt to consume >90% of meal/supplements to prevent further weight loss.   EDUCATION NEEDS: -Education needs addressed - discussed high calorie/protein rich foods and beverages, encouraged small frequent meals.   INTERVENTION: Chocolate Ensure Clinical Strength BID. Will monitor.   Dietitian #: [phone]  DOCUMENTATION CODES Per approved criteria  -Severe malnutrition in the context of acute illness or injury    Marshall Cork 12/15/2011, 2:30 PM

## 2011-12-15 NOTE — Progress Notes (Addendum)
Inpatient Diabetes Program Recommendations  AACE/ADA: New Consensus Statement on Inpatient Glycemic Control (2009)  Target Ranges:  Prepandial:   less than 140 mg/dL      Peak postprandial:   less than 180 mg/dL (1-2 hours)      Critically ill patients:  140 - 180 mg/dL   Reason for visit: Hyperglycemia  Inpatient Diabetes Program Recommendations HgbA1C: A1C 5.7% (12/14/11)- Good control at home Diet: Please change diet to Carbohydrate Modified Medium diet (currently on Regular diet with no carb restrictions)  Noted Lantus started last evening.  Also noted SSI and meal coverage started this morning.  Patient on IV Solumedrol.   Will follow. Ambrose Finland RN, MSN, CDE Diabetes Coordinator Inpatient Diabetes Program [phone]

## 2011-12-15 NOTE — Progress Notes (Signed)
Victoria Bird [id number] is a 66 y.o. female,  Outpatient Primary MD for the patient is No primary provider on file.  Chief Complaint  Patient presents with  . Shortness of Breath        Subjective:   Victoria Bird today has, No headache, No chest pain, No abdominal pain - No Nausea, No new weakness tingling or numbness, much improved Cough - SOB.    Objective:   Filed Vitals:   12/14/11 2146 12/15/11 0501 12/15/11 0546 12/15/11 0953  BP:  132/64    Pulse:  75    Temp:  97.4 F (36.3 C)    TempSrc:  Oral    Resp:  20    Height:      Weight:      SpO2: 92% 95% 94% 94%    Wt Readings from Last 3 Encounters:  12/14/11 75.252 kg (165 lb 14.4 oz)     Intake/Output Summary (Last 24 hours) at 12/15/11 1403 Last data filed at 12/14/11 2318  Gross per 24 hour  Intake    365 ml  Output    200 ml  Net    165 ml    Exam Awake Alert, Oriented *3, No new F.N deficits, Normal affect [place].AT,PERRAL Supple Neck,No JVD, No cervical lymphadenopathy appriciated.  Symmetrical Chest wall movement, Good air movement bilaterally, minimal wheezing RRR,No Gallops,Rubs or new Murmurs, No Parasternal Heave +ve B.Sounds, Abd Soft, Non tender, No organomegaly appriciated, No rebound -guarding or rigidity. No Cyanosis, Clubbing or edema, No new Rash or bruise      Data Review  CBC  Lab 12/15/11 0431 12/14/11 1650  WBC 10.3 13.3*  HGB 14.5 15.9*  HCT 43.0 47.1*  PLT 182 201  MCV 89.0 88.7  MCH 30.0 29.9  MCHC 33.7 33.8  RDW 12.9 13.0  LYMPHSABS -- 2.6  MONOABS -- 1.0  EOSABS -- 0.3  BASOSABS -- 0.0  BANDABS -- --    Chemistries   Lab 12/15/11 0431 12/14/11 1650  NA 139 139  K 4.0 4.1  CL 105 105  CO2 24 27  GLUCOSE 215* 100*  BUN 13 11  CREATININE 1.04 0.81  CALCIUM 8.6 9.1  MG -- --  AST 11 --  ALT 14 --  ALKPHOS 65 --  BILITOT 0.5 --    ------------------------------------------------------------------------------------------------------------------ estimated creatinine clearance is 53.1 ml/min (by C-G formula based on Cr of 1.04). ------------------------------------------------------------------------------------------------------------------  [name] 12/14/11 2102  HGBA1C 5.7*   ------------------------------------------------------------------------------------------------------------------ No results found for this basename: CHOL:2,HDL:2,LDLCALC:2,TRIG:2,CHOLHDL:2,LDLDIRECT:2 in the last 72 hours ------------------------------------------------------------------------------------------------------------------ No results found for this basename: TSH,T4TOTAL,FREET3,T3FREE,THYROIDAB in the last 72 hours ------------------------------------------------------------------------------------------------------------------ No results found for this basename: VITAMINB12:2,FOLATE:2,FERRITIN:2,TIBC:2,IRON:2,RETICCTPCT:2 in the last 72 hours  Coagulation profile No results found for this basename: INR:5,PROTIME:5 in the last 168 hours  No results found for this basename: DDIMER:2 in the last 72 hours  Cardiac Enzymes  Lab 12/14/11 1656  CKMB 1.9  TROPONINI <0.30  MYOGLOBIN --   ------------------------------------------------------------------------------------------------------------------ No components found with this basename: POCBNP:3  Micro Results No results found for this or any previous visit (from the past 240 hour(s)).  Radiology Reports Dg Chest 2 View  12/14/2011  *RADIOLOGY REPORT*  Clinical Data: Cough, shortness of breath.  CHEST - 2 VIEW  Comparison: None.  Findings: There is hyperinflation of the lungs compatible with COPD.  Peribronchial thickening.  Heart is normal size.  No confluent opacities or effusions.  No acute bony abnormality.  IMPRESSION: COPD/chronic bronchitic changes.  Original Report  Authenticated By:  Cyndie Chime, M.D.    Scheduled Meds:   . albuterol  10 mg Nebulization Once  . atorvastatin  10 mg Oral q1800  . budesonide-formoterol  2 puff Inhalation BID  . heparin  5,000 Units Subcutaneous Q8H  . lisinopril  20 mg Oral Daily   And  . hydrochlorothiazide  25 mg Oral Daily  .  HYDROmorphone (DILAUDID) injection  1 mg Intravenous Once  . influenza  inactive virus vaccine  0.5 mL Intramuscular Tomorrow-1000  . insulin aspart  0-15 Units Subcutaneous TID WC  . insulin aspart  0-5 Units Subcutaneous QHS  . insulin aspart  4 Units Subcutaneous TID WC  . insulin glargine  10 Units Subcutaneous QHS  . methylPREDNISolone (SOLU-MEDROL) injection  125 mg Intravenous Once  . moxifloxacin  400 mg Intravenous Q24H  . ondansetron (ZOFRAN) IV  4 mg Intravenous Once  . pneumococcal 23 valent vaccine  0.5 mL Intramuscular Tomorrow-1000  . predniSONE  50 mg Oral Q breakfast  . sodium chloride  1,000 mL Intravenous Once  . tiotropium  18 mcg Inhalation Daily  . DISCONTD: lisinopril-hydrochlorothiazide  1 tablet Oral Daily  . DISCONTD: methylPREDNISolone (SOLU-MEDROL) injection  80 mg Intravenous Q8H   Continuous Infusions:   . sodium chloride 100 mL/hr at 12/15/11 0532  . DISCONTD: sodium chloride 125 mL/hr at 12/14/11 1900   PRN Meds:.acetaminophen, acetaminophen, albuterol, albuterol, ALPRAZolam, guaiFENesin-dextromethorphan, HYDROcodone-acetaminophen, ondansetron (ZOFRAN) IV, ondansetron, polyethylene glycol  Assessment & Plan   -Respiratory failure, acute due to COPD with acute exacerbation (12/14/2011) Pt much improved, will switch to PO steroids, continue PRN Nebs-O2, she is down to 1lit/min, monitor on Avelox.   -Leukocytosis (12/14/2011) Likely mild Bronchitis, resolved.   -Diabetes mellitus (12/14/2011)  a1c 5.7, CBGs high due to IV steroid, steroid changed to PO, change to Carb Mod diet and monitor.  Lab Results  Component Value Date   HGBA1C 5.7*  12/14/2011     CBG (last 3)   Basename 12/14/11 2143  GLUCAP 263*      -Hypertension (12/14/2011) continue her home medications no changes were made her blood pressures currently well controlled.   DVT Prophylaxis   Heparin        Leroy Sea M.D on 12/15/2011 at 2:03 PM  [facility] Office  [phone]

## 2011-12-16 LAB — GLUCOSE, CAPILLARY
Glucose-Capillary: 189 mg/dL — ABNORMAL HIGH (ref 70–99)
Glucose-Capillary: 239 mg/dL — ABNORMAL HIGH (ref 70–99)
Glucose-Capillary: 284 mg/dL — ABNORMAL HIGH (ref 70–99)

## 2011-12-16 LAB — BASIC METABOLIC PANEL
BUN: 19 mg/dL (ref 6–23)
CO2: 27 mEq/L (ref 19–32)
Chloride: 103 mEq/L (ref 96–112)
Creatinine, Ser: 0.96 mg/dL (ref 0.50–1.10)

## 2011-12-16 MED ORDER — POTASSIUM CHLORIDE CRYS ER 20 MEQ PO TBCR: 20.0000 meq | EXTENDED_RELEASE_TABLET | Freq: Once | ORAL | Status: DC

## 2011-12-16 MED ORDER — FUROSEMIDE 10 MG/ML IJ SOLN
20.0000 mg | Freq: Once | INTRAMUSCULAR | Status: AC
  Administered 2011-12-16: 20 mg via INTRAVENOUS
  Filled 2011-12-16 (×2): qty 2

## 2011-12-16 MED ORDER — IBUPROFEN 600 MG PO TABS
600.0000 mg | ORAL_TABLET | Freq: Four times a day (QID) | ORAL | Status: DC | PRN
  Administered 2011-12-16 – 2011-12-17 (×3): 600 mg via ORAL
  Filled 2011-12-16: qty 1

## 2011-12-16 MED ORDER — MOXIFLOXACIN HCL 400 MG PO TABS
400.0000 mg | ORAL_TABLET | Freq: Every day | ORAL | Status: DC
  Administered 2011-12-16 – 2011-12-17 (×2): 400 mg via ORAL
  Filled 2011-12-16 (×3): qty 1

## 2011-12-16 NOTE — Progress Notes (Signed)
Victoria Bird [id number] is a 66 y.o. female,  Outpatient Primary MD for the patient is No primary provider on file.  Chief Complaint  Patient presents with  . Shortness of Breath        Subjective:   Victoria Bird today has, No headache, No chest pain, No abdominal pain - No Nausea, No new weakness tingling or numbness, much improved Cough - SOB.    Objective:   Filed Vitals:   12/15/11 2138 12/15/11 2245 12/16/11 0540 12/16/11 0913  BP:  145/61 112/59   Pulse:  70 67   Temp:  98.3 F (36.8 C) 97.9 F (36.6 C)   TempSrc:  Oral Oral   Resp:  20 20   Height:      Weight:      SpO2: 95% 92% 93% 94%    Wt Readings from Last 3 Encounters:  12/14/11 75.252 kg (165 lb 14.4 oz)     Intake/Output Summary (Last 24 hours) at 12/16/11 1206 Last data filed at 12/16/11 1123  Gross per 24 hour  Intake 3191.8 ml  Output      0 ml  Net 3191.8 ml    Exam Awake Alert, Oriented *3, No new F.N deficits, Normal affect [place].AT,PERRAL Supple Neck,No JVD, No cervical lymphadenopathy appriciated.  Symmetrical Chest wall movement, Good air movement bilaterally, minimal wheezing but +ve rales RRR,No Gallops,Rubs or new Murmurs, No Parasternal Heave +ve B.Sounds, Abd Soft, Non tender, No organomegaly appriciated, No rebound -guarding or rigidity. No Cyanosis, Clubbing , trace edema, No new Rash or bruise      Data Review  CBC  Lab 12/15/11 0431 12/14/11 1650  WBC 10.3 13.3*  HGB 14.5 15.9*  HCT 43.0 47.1*  PLT 182 201  MCV 89.0 88.7  MCH 30.0 29.9  MCHC 33.7 33.8  RDW 12.9 13.0  LYMPHSABS -- 2.6  MONOABS -- 1.0  EOSABS -- 0.3  BASOSABS -- 0.0  BANDABS -- --    Chemistries   Lab 12/15/11 0431 12/14/11 1650  NA 139 139  K 4.0 4.1  CL 105 105  CO2 24 27  GLUCOSE 215* 100*  BUN 13 11  CREATININE 1.04 0.81  CALCIUM 8.6 9.1  MG -- --  AST 11 --  ALT 14 --  ALKPHOS 65 --  BILITOT 0.5 --    ------------------------------------------------------------------------------------------------------------------ estimated creatinine clearance is 53.1 ml/min (by C-G formula based on Cr of 1.04). ------------------------------------------------------------------------------------------------------------------  [name] 12/14/11 2102  HGBA1C 5.7*   ------------------------------------------------------------------------------------------------------------------ No results found for this basename: CHOL:2,HDL:2,LDLCALC:2,TRIG:2,CHOLHDL:2,LDLDIRECT:2 in the last 72 hours ------------------------------------------------------------------------------------------------------------------ No results found for this basename: TSH,T4TOTAL,FREET3,T3FREE,THYROIDAB in the last 72 hours ------------------------------------------------------------------------------------------------------------------ No results found for this basename: VITAMINB12:2,FOLATE:2,FERRITIN:2,TIBC:2,IRON:2,RETICCTPCT:2 in the last 72 hours  Coagulation profile No results found for this basename: INR:5,PROTIME:5 in the last 168 hours  No results found for this basename: DDIMER:2 in the last 72 hours  Cardiac Enzymes  Lab 12/14/11 1656  CKMB 1.9  TROPONINI <0.30  MYOGLOBIN --   ------------------------------------------------------------------------------------------------------------------ No components found with this basename: POCBNP:3  Micro Results No results found for this or any previous visit (from the past 240 hour(s)).  Radiology Reports Dg Chest 2 View  12/14/2011  *RADIOLOGY REPORT*  Clinical Data: Cough, shortness of breath.  CHEST - 2 VIEW  Comparison: None.  Findings: There is hyperinflation of the lungs compatible with COPD.  Peribronchial thickening.  Heart is normal size.  No confluent opacities or effusions.  No acute bony abnormality.  IMPRESSION: COPD/chronic bronchitic changes.  Original Report  Authenticated By: Cyndie Chime, M.D.    Scheduled Meds:    . atorvastatin  10 mg Oral q1800  . budesonide-formoterol  2 puff Inhalation BID  . feeding supplement  237 mL Oral BID BM  . furosemide  20 mg Intravenous Once  . heparin  5,000 Units Subcutaneous Q8H  . lisinopril  20 mg Oral Daily   And  . hydrochlorothiazide  25 mg Oral Daily  . insulin aspart  0-15 Units Subcutaneous TID WC  . insulin aspart  0-5 Units Subcutaneous QHS  . insulin aspart  4 Units Subcutaneous TID WC  . insulin glargine  10 Units Subcutaneous QHS  . moxifloxacin  400 mg Intravenous Q24H  . predniSONE  50 mg Oral Q breakfast  . tiotropium  18 mcg Inhalation Daily  . DISCONTD: methylPREDNISolone (SOLU-MEDROL) injection  80 mg Intravenous Q8H  . DISCONTD: potassium chloride  20 mEq Oral Once   Continuous Infusions:    . DISCONTD: sodium chloride 100 mL/hr at 12/16/11 0102   PRN Meds:.acetaminophen, acetaminophen, albuterol, albuterol, ALPRAZolam, guaiFENesin-dextromethorphan, ibuprofen, ondansetron (ZOFRAN) IV, ondansetron, polyethylene glycol, DISCONTD: HYDROcodone-acetaminophen  Assessment & Plan   -Respiratory failure, acute due to COPD with acute exacerbation (12/14/2011) Pt much improved, will switch to PO steroids, continue PRN Nebs-O2, she is down to 1-2 lit/min, monitor on Avelox PO.  3.3lit +ve on fluids + rales, DC IVF, Lasix gentle, try to titrate off o2, likely am DC if better.   -Leukocytosis (12/14/2011) Likely mild Bronchitis, resolved.   -Diabetes mellitus (12/14/2011)  a1c 5.7, CBGs high due to IV steroid, steroid changed to PO, change to Carb Mod diet and monitor.  Lab Results  Component Value Date   HGBA1C 5.7* 12/14/2011     CBG (last 3)   Basename 12/16/11 1143 12/16/11 0834 12/15/11 2146  GLUCAP 177* 189* 170*      -Hypertension (12/14/2011) continue her home medications no changes were made her blood pressures currently well controlled.   DVT Prophylaxis    Heparin        Leroy Sea M.D on 12/16/2011 at 12:06 PM  [facility] Office  [phone]

## 2011-12-16 NOTE — Progress Notes (Signed)
Patient ambulated in hallway.  O2 sats 94-95%. While walking patient coughing some and O2 sats dropped to 90% but then back up to 95%.

## 2011-12-17 ENCOUNTER — Inpatient Hospital Stay (HOSPITAL_COMMUNITY): Payer: Medicaid Other

## 2011-12-17 LAB — BASIC METABOLIC PANEL
BUN: 20 mg/dL (ref 6–23)
Calcium: 8.9 mg/dL (ref 8.4–10.5)
GFR calc Af Amer: 72 mL/min — ABNORMAL LOW (ref >90)
GFR calc non Af Amer: 62 mL/min — ABNORMAL LOW (ref >90)
Potassium: 4.2 mEq/L (ref 3.5–5.1)

## 2011-12-17 LAB — GLUCOSE, CAPILLARY: Glucose-Capillary: 115 mg/dL — ABNORMAL HIGH (ref 70–99)

## 2011-12-17 MED ORDER — FUROSEMIDE 40 MG PO TABS
40.0000 mg | ORAL_TABLET | Freq: Once | ORAL | Status: AC
  Administered 2011-12-17: 40 mg via ORAL
  Filled 2011-12-17: qty 1

## 2011-12-17 MED ORDER — BENZONATATE 100 MG PO CAPS
100.0000 mg | ORAL_CAPSULE | Freq: Three times a day (TID) | ORAL | Status: DC | PRN
  Administered 2011-12-17 (×2): 100 mg via ORAL
  Filled 2011-12-17: qty 1

## 2011-12-17 NOTE — Progress Notes (Signed)
Victoria Bird:[id number],Victoria Bird:[id number] is a 66 y.o. female,  Outpatient Primary MD for the patient is No primary provider on file.  Chief Complaint  Patient presents with  . Shortness of Breath        Subjective:   Victoria Bird today has, No headache, No chest pain, No abdominal pain - No Nausea, No new weakness tingling or numbness, much improved Cough - SOB.    Objective:   Filed Vitals:   12/16/11 2043 12/16/11 2205 12/17/11 0720 12/17/11 0900  BP:  136/75 143/85   Pulse:  64 54   Temp:  98.4 F (36.9 C) 97.7 F (36.5 C)   TempSrc:  Oral Oral   Resp:  19 20   Height:      Weight:      SpO2: 93% 92% 99% 96%    Wt Readings from Last 3 Encounters:  12/14/11 75.252 kg (165 lb 14.4 oz)     Intake/Output Summary (Last 24 hours) at 12/17/11 1211 Last data filed at 12/17/11 1208  Gross per 24 hour  Intake   1280 ml  Output      0 ml  Net   1280 ml    Exam Awake Alert, Oriented *3, No new F.N deficits, Normal affect [place].AT,PERRAL Supple Neck,No JVD, No cervical lymphadenopathy appriciated.  Symmetrical Chest wall movement, Good air movement bilaterally, minimal wheezing but +ve rales RRR,No Gallops,Rubs or new Murmurs, No Parasternal Heave +ve B.Sounds, Abd Soft, Non tender, No organomegaly appriciated, No rebound -guarding or rigidity. No Cyanosis, Clubbing , trace edema, No new Rash or bruise      Data Review  CBC  Lab 12/15/11 0431 12/14/11 1650  WBC 10.3 13.3*  HGB 14.5 15.9*  HCT 43.0 47.1*  PLT 182 201  MCV 89.0 88.7  MCH 30.0 29.9  MCHC 33.7 33.8  RDW 12.9 13.0  LYMPHSABS -- 2.6  MONOABS -- 1.0  EOSABS -- 0.3  BASOSABS -- 0.0  BANDABS -- --    Chemistries   Lab 12/16/11 1305 12/15/11 0431 12/14/11 1650  NA 140 139 139  K 4.1 4.0 4.1  CL 103 105 105  CO2 27 24 27   GLUCOSE 256* 215* 100*  BUN 19 13 11   CREATININE 0.96 1.04 0.81  CALCIUM 8.7 8.6 9.1  MG -- -- --  AST -- 11 --  ALT -- 14 --  ALKPHOS -- 65 --  BILITOT -- 0.5 --    ------------------------------------------------------------------------------------------------------------------ estimated creatinine clearance is 57.6 ml/min (by C-G formula based on Cr of 0.96). ------------------------------------------------------------------------------------------------------------------  [facility] 12/14/11 2102  HGBA1C 5.7*   ------------------------------------------------------------------------------------------------------------------ No results found for this basename: CHOL:2,HDL:2,LDLCALC:2,TRIG:2,CHOLHDL:2,LDLDIRECT:2 in the last 72 hours ------------------------------------------------------------------------------------------------------------------ No results found for this basename: TSH,T4TOTAL,FREET3,T3FREE,THYROIDAB in the last 72 hours ------------------------------------------------------------------------------------------------------------------ No results found for this basename: VITAMINB12:2,FOLATE:2,FERRITIN:2,TIBC:2,IRON:2,RETICCTPCT:2 in the last 72 hours  Coagulation profile No results found for this basename: INR:5,PROTIME:5 in the last 168 hours  No results found for this basename: DDIMER:2 in the last 72 hours  Cardiac Enzymes  Lab 12/14/11 1656  CKMB 1.9  TROPONINI <0.30  MYOGLOBIN --   ------------------------------------------------------------------------------------------------------------------ No components found with this basename: POCBNP:3  Micro Results No results found for this or any previous visit (from the past 240 hour(s)).  Radiology Reports Dg Chest 2 View  12/14/2011  *RADIOLOGY REPORT*  Clinical Data: Cough, shortness of breath.  CHEST - 2 VIEW  Comparison: None.  Findings: There is hyperinflation of the lungs compatible with COPD.  Peribronchial thickening.  Heart is normal size.  No confluent opacities or effusions.  No acute bony abnormality.  IMPRESSION: COPD/chronic bronchitic changes.  Original Report  Authenticated By: Cyndie Chime, M.D.    Scheduled Meds:    . atorvastatin  10 mg Oral q1800  . budesonide-formoterol  2 puff Inhalation BID  . feeding supplement  237 mL Oral BID BM  . furosemide  40 mg Oral Once  . heparin  5,000 Units Subcutaneous Q8H  . lisinopril  20 mg Oral Victoria   And  . hydrochlorothiazide  25 mg Oral Victoria  . insulin aspart  0-15 Units Subcutaneous TID WC  . insulin aspart  0-5 Units Subcutaneous QHS  . insulin aspart  4 Units Subcutaneous TID WC  . insulin glargine  10 Units Subcutaneous QHS  . moxifloxacin  400 mg Oral q1800  . predniSONE  50 mg Oral Q breakfast  . tiotropium  18 mcg Inhalation Victoria   Continuous Infusions:   PRN Meds:.acetaminophen, acetaminophen, albuterol, albuterol, ALPRAZolam, benzonatate, guaiFENesin-dextromethorphan, ibuprofen, ondansetron (ZOFRAN) IV, ondansetron, polyethylene glycol  Assessment & Plan   -Respiratory failure, acute due to COPD with acute exacerbation (12/14/2011) Pt much improved, will switch to PO steroids, continue PRN Nebs-O2, she is down to 1-2 lit/min, monitor on Avelox PO.  3.3lit +ve on fluids + rales, DC'd IVF, Lasix gentle, try to titrate off o2, CXR repeat as still some SOB and needing o2. Try titrating off o2 if Pox>90%. Counseled to stop smoking.   -Leukocytosis (12/14/2011) Likely mild Bronchitis, resolved.   -Diabetes mellitus (12/14/2011)  a1c 5.7, CBGs high due to IV steroid, steroid changed to PO, change to Carb Mod diet and monitor.  Lab Results  Component Value Date   HGBA1C 5.7* 12/14/2011     CBG (last 3)   Basename 12/17/11 0812 12/16/11 2136 12/16/11 1646  GLUCAP 115* 284* 239*      -Hypertension (12/14/2011) continue her home medications no changes were made her blood pressures currently well controlled.   DVT Prophylaxis   Heparin        Leroy Sea M.D on 12/17/2011 at 12:11 PM  [facility] Office  [phone]

## 2011-12-18 LAB — GLUCOSE, CAPILLARY
Glucose-Capillary: 122 mg/dL — ABNORMAL HIGH (ref 70–99)
Glucose-Capillary: 196 mg/dL — ABNORMAL HIGH (ref 70–99)

## 2011-12-18 MED ORDER — ALBUTEROL SULFATE HFA 108 (90 BASE) MCG/ACT IN AERS: 2.0000 | INHALATION_SPRAY | Freq: Four times a day (QID) | RESPIRATORY_TRACT | Status: DC | PRN

## 2011-12-18 MED ORDER — BENZONATATE 100 MG PO CAPS: 100.0000 mg | ORAL_CAPSULE | Freq: Three times a day (TID) | ORAL | Status: AC | PRN

## 2011-12-18 MED ORDER — PREDNISONE 5 MG PO TABS: 5.0000 mg | ORAL_TABLET | Freq: Every day | ORAL | Status: AC

## 2011-12-18 MED ORDER — MOXIFLOXACIN HCL 400 MG PO TABS: 400.0000 mg | ORAL_TABLET | Freq: Every day | ORAL | Status: DC

## 2011-12-18 NOTE — Discharge Summary (Signed)
Victoria Bird, 65 y.o., DOB 11/18/1945, MRN [id number]. Admission date: 12/14/2011 Discharge Date 12/18/2011 Primary MD No primary provider on file. Admitting Physician Abraham Feliz Ortiz, MD  Admission Diagnosis  Hypoxia [799.02] COPD exacerbation [491.21] SOB  Discharge Diagnosis   Active Problems:  Respiratory failure, acute  COPD with acute exacerbation  Leukocytosis  Diabetes mellitus  Hypertension    Past Medical History  Diagnosis Date  . Diabetes mellitus   . Hypertension   . COPD (chronic obstructive pulmonary disease)   . Bronchiectasis   . Chronic pain   . Anxiety     Past Surgical History  Procedure Date  . Appendectomy   . Cholecystectomy   . Abdominal hysterectomy   . Joint replacement   . Fracture surgery      Hospital Course See H&P, Labs, Consult and Test reports for all details in brief, patient was admitted for     - Acute Respiratory failure, acute due to COPD with acute exacerbation (12/14/2011) Pt much improved, on taper of PO steroids, continue PRN Inhaler along with Spiriva and Symbicort at home, not needing -O2, Pox 90% on ambulation on RA.  will place on 3 more doses of Avelox PO. CXR stable, counseled to stop smoking, exam stable, in no distress, no wheezing.    -Leukocytosis (12/14/2011) Likely mild Bronchitis, resolved.     -Diabetes mellitus (12/14/2011)   a1c 5.7, CBGs better, rapid taper of steroid  PO, DC on DM diet and Glucophage home dose.   Lab Results  Component Value Date   HGBA1C 5.7* 12/14/2011      -Hypertension (12/14/2011) continue her home medications no changes were made her blood pressures currently well controlled.    Low Back pain - on 2nd day of admission -  musculoskeletal, no point tenderness, good ROM in spine, No F.N Deficits, no incontinence, already better, was due to bad posture in Hospital, bed.    Significant Tests:  See full reports for all details     Dg Chest 2 View  12/17/2011   *RADIOLOGY REPORT*  Clinical Data: Shortness of breath.  Back and chest pain.  History of COPD.  CHEST - 2 VIEW  Comparison: 12/14/2011  Findings: There is slight chronic accentuation of the interstitial markings.  The heart size and vascularity are normal.  No infiltrates or effusions.  No acute osseous abnormalities.  Diffuse osteopenia.  IMPRESSION: No acute abnormality.  Chronic accentuation of the interstitial markings.  Markings are less prominent and vascularity is less prominent than on the prior exam.  Original Report Authenticated By: JAMES H. MAXWELL, M.D.   Dg Chest 2 View  12/14/2011  *RADIOLOGY REPORT*  Clinical Data: Cough, shortness of breath.  CHEST - 2 VIEW  Comparison: None.  Findings: There is hyperinflation of the lungs compatible with COPD.  Peribronchial thickening.  Heart is normal size.  No confluent opacities or effusions.  No acute bony abnormality.  IMPRESSION: COPD/chronic bronchitic changes.  Original Report Authenticated By: KEVIN G. DOVER, M.D.     Today   Subjective:   Victoria Bird today has no headache,no chest abdominal pain,no new weakness tingling or numbness, feels much better wants to go home today.    Objective:   Blood pressure 109/61, pulse 73, temperature 97.8 F (36.6 C), temperature source Oral, resp. rate 18, height 5' 3" (1.6 m), weight 75.252 kg (165 lb 14.4 oz), SpO2 90.00%.  Intake/Output Summary (Last 24 hours) at 12/18/11 1327 Last data filed at 12/17/11 1700  Gross per   24 hour  Intake    240 ml  Output      0 ml  Net    240 ml    Exam Awake Alert, Oriented *3, No new F.N deficits, Normal affect [place].AT,PERRAL Supple Neck,No JVD, No cervical lymphadenopathy appriciated.  Symmetrical Chest wall movement, Good air movement bilaterally, CTAB RRR,No Gallops,Rubs or new Murmurs, No Parasternal Heave +ve B.Sounds, Abd Soft, Non tender, No organomegaly appriciated, No rebound -guarding or rigidity. No Cyanosis, Clubbing or edema, No new  Rash or bruise  Data Review      CBC w Diff: Lab Results  Component Value Date   WBC 10.3 12/15/2011   HGB 14.5 12/15/2011   HCT 43.0 12/15/2011   PLT 182 12/15/2011   LYMPHOPCT 20 12/14/2011   MONOPCT 8 12/14/2011   EOSPCT 2 12/14/2011   BASOPCT 0 12/14/2011   CMP: Lab Results  Component Value Date   NA 134* 12/17/2011   K 4.2 12/17/2011   CL 96 12/17/2011   CO2 28 12/17/2011   BUN 20 12/17/2011   CREATININE 0.95 12/17/2011   PROT 6.6 12/15/2011   ALBUMIN 3.0* 12/15/2011   BILITOT 0.5 12/15/2011   ALKPHOS 65 12/15/2011   AST 11 12/15/2011   ALT 14 12/15/2011  .  Micro Results No results found for this or any previous visit (from the past 240 hour(s)).   Discharge Instructions     Follow with Primary MD  in 4 days   Get CBC, CMP, checked 4 days by Primary MD and again as instructed by your Primary MD. Get a 2 view Chest X ray done next visit.  Get Medicines reviewed and adjusted.  Please request your Prim.MD to go over all Hospital Tests and Procedure/Radiological results at the follow up, please get all Hospital records sent to your Prim MD by signing hospital release before you go home.  Follow-up Information    Follow up with Mitsy, [place]. Schedule an appointment as soon as possible for a visit in 4 days.         Activity: Fall precautions use walker/cane & assistance as needed  Diet: Heart Healthy-Diabetic, Aspiration precautions.  For Heart failure patients - Check your Weight same time everyday, if you gain over 2 pounds, or you develop in leg swelling, experience more shortness of breath or chest pain, call your Primary MD immediately. Follow Cardiac Low Salt Diet and 1.8 lit/day fluid restriction.  Disposition Home  If you experience worsening of your admission symptoms, develop shortness of breath, life threatening emergency, suicidal or homicidal thoughts you must seek medical attention immediately by calling 911 or calling your MD immediately  if symptoms less  severe.  You Must read complete instructions/literature along with all the possible adverse reactions/side effects for all the Medicines you take and that have been prescribed to you. Take any new Medicines after you have completely understood and accpet all the possible adverse reactions/side effects.   Do not drive if your were admitted for syncope or siezures until you have seen by Primary MD or a Neurologist and advised to drive.  Do not drive when taking Pain medications.    Do not take more than prescribed Pain, Sleep and Anxiety Medications  Special Instructions: If you have smoked or chewed Tobacco  in the last 2 yrs please stop smoking, stop any regular Alcohol  and or any Recreational drug use.  Wear Seat belts while driving.  Follow-up Information    Follow up with Mitsy, [place]. Schedule   an appointment as soon as possible for a visit in 4 days.         Discharge Medications   Medication List  As of 12/18/2011  1:36 PM   START taking these medications         benzonatate 100 MG capsule   Commonly known as: TESSALON   Take 1 capsule (100 mg total) by mouth 3 (three) times daily as needed for cough.      moxifloxacin 400 MG tablet   Commonly known as: AVELOX   Take 1 tablet (400 mg total) by mouth daily at 6 PM.      predniSONE 5 MG tablet   Commonly known as: DELTASONE   Take 1 tablet (5 mg total) by mouth daily. Label  & dispense according to the schedule below. 8 Pills PO for 3 days, 6 Pills PO for 3 days, 4 Pills PO for 3 days, 2 Pills PO for 3 days, 1 Pills PO for 3 days, 1/2 Pill  PO for 3 days then STOP.         CONTINUE taking these medications         albuterol 108 (90 BASE) MCG/ACT inhaler   Commonly known as: PROVENTIL HFA;VENTOLIN HFA   Inhale 2 puffs into the lungs every 6 (six) hours as needed. For shortness of breath.      ALPRAZolam 0.5 MG tablet   Commonly known as: XANAX      budesonide-formoterol 160-4.5 MCG/ACT inhaler   Commonly known  as: SYMBICORT      lisinopril-hydrochlorothiazide 20-25 MG per tablet   Commonly known as: PRINZIDE,ZESTORETIC      metFORMIN 500 MG tablet   Commonly known as: GLUCOPHAGE      rosuvastatin 10 MG tablet   Commonly known as: CRESTOR      tiotropium 18 MCG inhalation capsule   Commonly known as: SPIRIVA          Where to get your medications    These are the prescriptions that you need to pick up.   You may get these medications from any pharmacy.         albuterol 108 (90 BASE) MCG/ACT inhaler   benzonatate 100 MG capsule   moxifloxacin 400 MG tablet   predniSONE 5 MG tablet            Total Time in preparing paper work, data evaluation and todays exam - 35 minutes  Chetan Mehring K M.D on 12/18/2011 at 1:27 PM  [facility]  [phone]      rosuvastatin 10 MG tablet     Commonly known as: CRESTOR           tiotropium 18 MCG inhalation capsule     Commonly known as: SPIRIVA                 Where to get your medications      These are the prescriptions that you need to pick up.     You may get these medications from any pharmacy.              albuterol 108 (90 BASE) MCG/ACT inhaler     benzonatate 100 MG capsule     moxifloxacin 400 MG tablet     predniSONE 5 MG tablet                    Total Time in preparing paper work, data evaluation and todays exam - 35 minutes   Leroy Sea M.D on 12/18/2011 at 1:27 PM   [facility] Office  [phone]

## 2011-12-18 NOTE — Discharge Instructions (Signed)
Follow with Primary MD  in 4 days   Get CBC, CMP, checked 4 days by Primary MD and again as instructed by your Primary MD. Get a 2 view Chest X ray done next visit.  Get Medicines reviewed and adjusted.  Please request your Prim.MD to go over all Hospital Tests and Procedure/Radiological results at the follow up, please get all Hospital records sent to your Prim MD by signing hospital release before you go home.  Follow-up Information    Follow up with Mitsy, Wynona Neat. Schedule an appointment as soon as possible for a visit in 4 days.         Activity: Fall precautions use walker/cane & assistance as needed  Diet: Heart Healthy-Diabetic, Aspiration precautions.  For Heart failure patients - Check your Weight same time everyday, if you gain over 2 pounds, or you develop in leg swelling, experience more shortness of breath or chest pain, call your Primary MD immediately. Follow Cardiac Low Salt Diet and 1.8 lit/day fluid restriction.  Disposition Home  If you experience worsening of your admission symptoms, develop shortness of breath, life threatening emergency, suicidal or homicidal thoughts you must seek medical attention immediately by calling 911 or calling your MD immediately  if symptoms less severe.  You Must read complete instructions/literature along with all the possible adverse reactions/side effects for all the Medicines you take and that have been prescribed to you. Take any new Medicines after you have completely understood and accpet all the possible adverse reactions/side effects.   Do not drive if your were admitted for syncope or siezures until you have seen by Primary MD or a Neurologist and advised to drive.  Do not drive when taking Pain medications.    Do not take more than prescribed Pain, Sleep and Anxiety Medications  Special Instructions: If you have smoked or chewed Tobacco  in the last 2 yrs please stop smoking, stop any regular Alcohol  and or any  Recreational drug use.  Wear Seat belts while driving.

## 2011-12-18 NOTE — Progress Notes (Addendum)
Victoria Bird, 66 y.o., DOB 1946-07-14, MRN [id number]. Admission date: 12/14/2011 Discharge Date 12/18/2011 Primary MD No primary provider on file. Admitting Physician Marinda Elk, MD  Admission Diagnosis  Hypoxia [799.02] COPD exacerbation [491.21] SOB  Discharge Diagnosis   Active Problems:  Respiratory failure, acute  COPD with acute exacerbation  Leukocytosis  Diabetes mellitus  Hypertension    Past Medical History  Diagnosis Date  . Diabetes mellitus   . Hypertension   . COPD (chronic obstructive pulmonary disease)   . Bronchiectasis   . Chronic pain   . Anxiety     Past Surgical History  Procedure Date  . Appendectomy   . Cholecystectomy   . Abdominal hysterectomy   . Joint replacement   . Fracture surgery      Hospital Course See H&P, Labs, Consult and Test reports for all details in brief, patient was admitted for     - Acute Respiratory failure, acute due to COPD with acute exacerbation (12/14/2011) Pt much improved, on taper of PO steroids, continue PRN Inhaler along with Spiriva and Symbicort at home, not needing -O2, Pox 90% on ambulation on RA.  will place on 3 more doses of Avelox PO. CXR stable, counseled to stop smoking, exam stable, in no distress, no wheezing.    -Leukocytosis (12/14/2011) Likely mild Bronchitis, resolved.     -Diabetes mellitus (12/14/2011)   a1c 5.7, CBGs better, rapid taper of steroid  PO, DC on DM diet and Glucophage home dose.   Lab Results  Component Value Date   HGBA1C 5.7* 12/14/2011      -Hypertension (12/14/2011) continue her home medications no changes were made her blood pressures currently well controlled.    Low Back pain - on 2nd day of admission -  musculoskeletal, no point tenderness, good ROM in spine, No F.N Deficits, no incontinence, already better, was due to bad posture in Hospital, bed.    Significant Tests:  See full reports for all details     Dg Chest 2 View  12/17/2011   *RADIOLOGY REPORT*  Clinical Data: Shortness of breath.  Back and chest pain.  History of COPD.  CHEST - 2 VIEW  Comparison: 12/14/2011  Findings: There is slight chronic accentuation of the interstitial markings.  The heart size and vascularity are normal.  No infiltrates or effusions.  No acute osseous abnormalities.  Diffuse osteopenia.  IMPRESSION: No acute abnormality.  Chronic accentuation of the interstitial markings.  Markings are less prominent and vascularity is less prominent than on the prior exam.  Original Report Authenticated By: Gwynn Burly, M.D.   Dg Chest 2 View  12/14/2011  *RADIOLOGY REPORT*  Clinical Data: Cough, shortness of breath.  CHEST - 2 VIEW  Comparison: None.  Findings: There is hyperinflation of the lungs compatible with COPD.  Peribronchial thickening.  Heart is normal size.  No confluent opacities or effusions.  No acute bony abnormality.  IMPRESSION: COPD/chronic bronchitic changes.  Original Report Authenticated By: Cyndie Chime, M.D.     Today   Subjective:   Victoria Bird today has no headache,no chest abdominal pain,no new weakness tingling or numbness, feels much better wants to go home today.    Objective:   Blood pressure 109/61, pulse 73, temperature 97.8 F (36.6 C), temperature source Oral, resp. rate 18, height 5\' 3"  (1.6 m), weight 75.252 kg (165 lb 14.4 oz), SpO2 90.00%.  Intake/Output Summary (Last 24 hours) at 12/18/11 1327 Last data filed at 12/17/11 1700  Gross per  24 hour  Intake    240 ml  Output      0 ml  Net    240 ml    Exam Awake Alert, Oriented *3, No new F.N deficits, Normal affect [place].AT,PERRAL Supple Neck,No JVD, No cervical lymphadenopathy appriciated.  Symmetrical Chest wall movement, Good air movement bilaterally, CTAB RRR,No Gallops,Rubs or new Murmurs, No Parasternal Heave +ve B.Sounds, Abd Soft, Non tender, No organomegaly appriciated, No rebound -guarding or rigidity. No Cyanosis, Clubbing or edema, No new  Rash or bruise  Data Review      CBC w Diff: Lab Results  Component Value Date   WBC 10.3 12/15/2011   HGB 14.5 12/15/2011   HCT 43.0 12/15/2011   PLT 182 12/15/2011   LYMPHOPCT 20 12/14/2011   MONOPCT 8 12/14/2011   EOSPCT 2 12/14/2011   BASOPCT 0 12/14/2011   CMP: Lab Results  Component Value Date   NA 134* 12/17/2011   K 4.2 12/17/2011   CL 96 12/17/2011   CO2 28 12/17/2011   BUN 20 12/17/2011   CREATININE 0.95 12/17/2011   PROT 6.6 12/15/2011   ALBUMIN 3.0* 12/15/2011   BILITOT 0.5 12/15/2011   ALKPHOS 65 12/15/2011   AST 11 12/15/2011   ALT 14 12/15/2011  .  Micro Results No results found for this or any previous visit (from the past 240 hour(s)).   Discharge Instructions     Follow with Primary MD  in 4 days   Get CBC, CMP, checked 4 days by Primary MD and again as instructed by your Primary MD. Get a 2 view Chest X ray done next visit.  Get Medicines reviewed and adjusted.  Please request your Prim.MD to go over all Hospital Tests and Procedure/Radiological results at the follow up, please get all Hospital records sent to your Prim MD by signing hospital release before you go home.  Follow-up Information    Follow up with Mitsy, Wynona Neat. Schedule an appointment as soon as possible for a visit in 4 days.         Activity: Fall precautions use walker/cane & assistance as needed  Diet: Heart Healthy-Diabetic, Aspiration precautions.  For Heart failure patients - Check your Weight same time everyday, if you gain over 2 pounds, or you develop in leg swelling, experience more shortness of breath or chest pain, call your Primary MD immediately. Follow Cardiac Low Salt Diet and 1.8 lit/day fluid restriction.  Disposition Home  If you experience worsening of your admission symptoms, develop shortness of breath, life threatening emergency, suicidal or homicidal thoughts you must seek medical attention immediately by calling 911 or calling your MD immediately  if symptoms less  severe.  You Must read complete instructions/literature along with all the possible adverse reactions/side effects for all the Medicines you take and that have been prescribed to you. Take any new Medicines after you have completely understood and accpet all the possible adverse reactions/side effects.   Do not drive if your were admitted for syncope or siezures until you have seen by Primary MD or a Neurologist and advised to drive.  Do not drive when taking Pain medications.    Do not take more than prescribed Pain, Sleep and Anxiety Medications  Special Instructions: If you have smoked or chewed Tobacco  in the last 2 yrs please stop smoking, stop any regular Alcohol  and or any Recreational drug use.  Wear Seat belts while driving.  Follow-up Information    Follow up with Mitsy, Wynona Neat. Schedule  an appointment as soon as possible for a visit in 4 days.         Discharge Medications   Medication List  As of 12/18/2011  1:36 PM   START taking these medications         benzonatate 100 MG capsule   Commonly known as: TESSALON   Take 1 capsule (100 mg total) by mouth 3 (three) times daily as needed for cough.      moxifloxacin 400 MG tablet   Commonly known as: AVELOX   Take 1 tablet (400 mg total) by mouth daily at 6 PM.      predniSONE 5 MG tablet   Commonly known as: DELTASONE   Take 1 tablet (5 mg total) by mouth daily. Label  & dispense according to the schedule below. 8 Pills PO for 3 days, 6 Pills PO for 3 days, 4 Pills PO for 3 days, 2 Pills PO for 3 days, 1 Pills PO for 3 days, 1/2 Pill  PO for 3 days then STOP.         CONTINUE taking these medications         albuterol 108 (90 BASE) MCG/ACT inhaler   Commonly known as: PROVENTIL HFA;VENTOLIN HFA   Inhale 2 puffs into the lungs every 6 (six) hours as needed. For shortness of breath.      ALPRAZolam 0.5 MG tablet   Commonly known as: XANAX      budesonide-formoterol 160-4.5 MCG/ACT inhaler   Commonly known  as: SYMBICORT      lisinopril-hydrochlorothiazide 20-25 MG per tablet   Commonly known as: PRINZIDE,ZESTORETIC      metFORMIN 500 MG tablet   Commonly known as: GLUCOPHAGE      rosuvastatin 10 MG tablet   Commonly known as: CRESTOR      tiotropium 18 MCG inhalation capsule   Commonly known as: SPIRIVA          Where to get your medications    These are the prescriptions that you need to pick up.   You may get these medications from any pharmacy.         albuterol 108 (90 BASE) MCG/ACT inhaler   benzonatate 100 MG capsule   moxifloxacin 400 MG tablet   predniSONE 5 MG tablet            Total Time in preparing paper work, data evaluation and todays exam - 35 minutes  Leroy Sea M.D on 12/18/2011 at 1:27 PM  [facility] Office  [phone]

## 2011-12-21 ENCOUNTER — Encounter (HOSPITAL_COMMUNITY): Payer: Self-pay | Admitting: *Deleted

## 2011-12-21 ENCOUNTER — Emergency Department (HOSPITAL_COMMUNITY)
Admission: EM | Admit: 2011-12-21 | Discharge: 2011-12-21 | Discharge status: Against Medical Advice | Payer: Medicaid Other | Attending: Emergency Medicine | Admitting: Emergency Medicine

## 2011-12-21 DIAGNOSIS — D72829 Elevated white blood cell count, unspecified: Secondary | ICD-10-CM | POA: Insufficient documentation

## 2011-12-21 NOTE — ED Notes (Signed)
Pt in s/p being discharged from hospital on Monday afternoon, states she was called and told to return to due elevated WBC, states she has not been taking medication as directed at home, states she has been unable to get all medications

## 2011-12-24 ENCOUNTER — Emergency Department (HOSPITAL_COMMUNITY): Payer: Medicaid Other

## 2011-12-24 ENCOUNTER — Encounter (HOSPITAL_COMMUNITY): Payer: Self-pay | Admitting: *Deleted

## 2011-12-24 ENCOUNTER — Inpatient Hospital Stay (HOSPITAL_COMMUNITY)
Admission: EM | Admit: 2011-12-24 | Discharge: 2012-01-01 | DRG: 191 | Disposition: A | Discharge status: Home / Self Care | Payer: Medicaid Other | Attending: Internal Medicine | Admitting: Internal Medicine

## 2011-12-24 ENCOUNTER — Other Ambulatory Visit: Payer: Self-pay

## 2011-12-24 DIAGNOSIS — J441 Chronic obstructive pulmonary disease with (acute) exacerbation: Principal | ICD-10-CM | POA: Diagnosis present

## 2011-12-24 DIAGNOSIS — I10 Essential (primary) hypertension: Secondary | ICD-10-CM | POA: Diagnosis present

## 2011-12-24 DIAGNOSIS — K219 Gastro-esophageal reflux disease without esophagitis: Secondary | ICD-10-CM

## 2011-12-24 DIAGNOSIS — J40 Bronchitis, not specified as acute or chronic: Secondary | ICD-10-CM | POA: Diagnosis present

## 2011-12-24 DIAGNOSIS — T380X5A Adverse effect of glucocorticoids and synthetic analogues, initial encounter: Secondary | ICD-10-CM | POA: Diagnosis present

## 2011-12-24 DIAGNOSIS — IMO0002 Reserved for concepts with insufficient information to code with codable children: Secondary | ICD-10-CM

## 2011-12-24 DIAGNOSIS — R110 Nausea: Secondary | ICD-10-CM

## 2011-12-24 DIAGNOSIS — K5289 Other specified noninfective gastroenteritis and colitis: Secondary | ICD-10-CM | POA: Diagnosis present

## 2011-12-24 DIAGNOSIS — E860 Dehydration: Secondary | ICD-10-CM

## 2011-12-24 DIAGNOSIS — F17200 Nicotine dependence, unspecified, uncomplicated: Secondary | ICD-10-CM | POA: Diagnosis present

## 2011-12-24 DIAGNOSIS — E871 Hypo-osmolality and hyponatremia: Secondary | ICD-10-CM | POA: Diagnosis present

## 2011-12-24 DIAGNOSIS — E119 Type 2 diabetes mellitus without complications: Secondary | ICD-10-CM | POA: Diagnosis present

## 2011-12-24 DIAGNOSIS — J449 Chronic obstructive pulmonary disease, unspecified: Secondary | ICD-10-CM | POA: Diagnosis present

## 2011-12-24 DIAGNOSIS — R5381 Other malaise: Secondary | ICD-10-CM | POA: Diagnosis present

## 2011-12-24 DIAGNOSIS — J9600 Acute respiratory failure, unspecified whether with hypoxia or hypercapnia: Secondary | ICD-10-CM

## 2011-12-24 DIAGNOSIS — D72829 Elevated white blood cell count, unspecified: Secondary | ICD-10-CM | POA: Diagnosis present

## 2011-12-24 HISTORY — DX: Bronchitis, not specified as acute or chronic: J40

## 2011-12-24 LAB — DIFFERENTIAL
Eosinophils Absolute: 0.2 10*3/uL (ref 0.0–0.7)
Eosinophils Relative: 2 % (ref 0–5)
Lymphocytes Relative: 26 % (ref 12–46)
Lymphs Abs: 3.6 10*3/uL (ref 0.7–4.0)
Monocytes Absolute: 1 10*3/uL (ref 0.1–1.0)

## 2011-12-24 LAB — CBC
HCT: 47.7 % — ABNORMAL HIGH (ref 36.0–46.0)
MCH: 30.2 pg (ref 26.0–34.0)
MCV: 88.8 fL (ref 78.0–100.0)
Platelets: 179 10*3/uL (ref 150–400)
RBC: 5.37 MIL/uL — ABNORMAL HIGH (ref 3.87–5.11)

## 2011-12-24 MED ORDER — IPRATROPIUM BROMIDE 0.02 % IN SOLN
0.5000 mg | Freq: Once | RESPIRATORY_TRACT | Status: AC
  Administered 2011-12-24: 0.5 mg via RESPIRATORY_TRACT
  Filled 2011-12-24: qty 2.5

## 2011-12-24 MED ORDER — ALBUTEROL SULFATE (5 MG/ML) 0.5% IN NEBU
5.0000 mg | INHALATION_SOLUTION | Freq: Once | RESPIRATORY_TRACT | Status: AC
  Administered 2011-12-24: 5 mg via RESPIRATORY_TRACT
  Filled 2011-12-24: qty 1

## 2011-12-24 MED ORDER — METHYLPREDNISOLONE SODIUM SUCC 125 MG IJ SOLR
125.0000 mg | Freq: Once | INTRAMUSCULAR | Status: AC
  Administered 2011-12-25: 125 mg via INTRAVENOUS
  Filled 2011-12-24: qty 2

## 2011-12-24 NOTE — ED Notes (Signed)
Pt unable to get rx filled. Pt was told to return to ED for treatment.

## 2011-12-24 NOTE — ED Provider Notes (Signed)
History     CSN: [id number]  Arrival date & time 12/24/11  2050   First MD Initiated Contact with Patient 12/24/11 2239      Chief Complaint  Patient presents with  . Shortness of Breath    in hospital from 3/14 to 3/18,  for chronic bronchitis, diabetes, lung failure,  released and given medication to fill,  pt unable to fill medications,  . Cough  . Pleurisy    (Consider location/radiation/quality/duration/timing/severity/associated sxs/prior treatment) Patient is a 66 y.o. female presenting with shortness of breath. The history is provided by the patient.  Shortness of Breath  Associated symptoms include chest pain, cough and shortness of breath. Pertinent negatives include no fever.  Pt states she was discharged from the hospital about a week ago where she was treated for COPD/respiratory problems. States she was unable to fill her antibiotic and did not take prednisone. States symptoms have been worsening. States having severe cough, shortness of breath, pain all over. In addition, since discharge, developed nausea, vomiting, diarrhea, which is now improving. Was here on Thursday, but left prior to being seen. Was unable to return due to no ride.  Pt denies fever. Admits to chest pain, mainly with cough, abdominal pain "all over."   Past Medical History  Diagnosis Date  . Diabetes mellitus   . Hypertension   . COPD (chronic obstructive pulmonary disease)   . Bronchiectasis   . Chronic pain   . Anxiety     Past Surgical History  Procedure Date  . Appendectomy   . Cholecystectomy   . Abdominal hysterectomy   . Joint replacement   . Fracture surgery     Family History  Problem Relation Age of Onset  . Heart attack Mother     History  Substance Use Topics  . Smoking status: Current Everyday Smoker -- 1.0 packs/day for 39 years    Types: Cigarettes  . Smokeless tobacco: Never Used  . Alcohol Use: No    OB History    Grav Para Term Preterm Abortions TAB SAB Ect  Mult Living                  Review of Systems  Constitutional: Positive for chills, activity change, appetite change and fatigue. Negative for fever.  HENT: Positive for congestion and neck pain.   Eyes: Negative.   Respiratory: Positive for cough and shortness of breath.   Cardiovascular: Positive for chest pain.  Gastrointestinal: Positive for nausea, vomiting, abdominal pain and diarrhea.  Genitourinary: Negative.   Skin: Negative.   Neurological: Positive for weakness. Negative for dizziness, light-headedness and numbness.  Psychiatric/Behavioral: Negative.     Allergies  Codeine; Penicillins; Prednisone; and Sulfa antibiotics  Home Medications   Current Outpatient Rx  Name Route Sig Dispense Refill  . ALBUTEROL SULFATE HFA 108 (90 BASE) MCG/ACT IN AERS Inhalation Inhale 2 puffs into the lungs every 6 (six) hours as needed. For shortness of breath. 1 Inhaler 1  . ALPRAZOLAM 0.5 MG PO TABS Oral Take 0.5 mg by mouth 3 (three) times daily as needed. For anxiety.    Marland Kitchen BENZONATATE 100 MG PO CAPS Oral Take 1 capsule (100 mg total) by mouth 3 (three) times daily as needed for cough. 20 capsule 0  . BUDESONIDE-FORMOTEROL FUMARATE 160-4.5 MCG/ACT IN AERO Inhalation Inhale 2 puffs into the lungs 2 (two) times daily.    Marland Kitchen LISINOPRIL-HYDROCHLOROTHIAZIDE 20-25 MG PO TABS Oral Take 1 tablet by mouth daily.    Marland Kitchen METFORMIN HCL  500 MG PO TABS Oral Take 1,000 mg by mouth 2 (two) times daily with a meal.    . PREDNISONE 5 MG PO TABS Oral Take 1 tablet (5 mg total) by mouth daily. Label  & dispense according to the schedule below. 8 Pills PO for 3 days, 6 Pills PO for 3 days, 4 Pills PO for 3 days, 2 Pills PO for 3 days, 1 Pills PO for 3 days, 1/2 Pill  PO for 3 days then STOP. 80 tablet 0    Dispense as written.  Marland Kitchen ROSUVASTATIN CALCIUM 10 MG PO TABS Oral Take 10 mg by mouth daily.    Marland Kitchen TIOTROPIUM BROMIDE MONOHYDRATE 18 MCG IN CAPS Inhalation Place 18 mcg into inhaler and inhale daily.      BP  135/58  Pulse 100  Temp(Src) 98.4 F (36.9 C) (Oral)  Resp 20  SpO2 94%  Physical Exam  Nursing note and vitals reviewed. Constitutional: She is oriented to person, place, and time. She appears well-developed and well-nourished. No distress.       Smells of alcohol  HENT:  Head: Normocephalic.       Oral mucosa dry  Eyes: Conjunctivae are normal.  Neck: Neck supple.  Cardiovascular: Normal rate and normal heart sounds.        tachycardic  Pulmonary/Chest: Effort normal. She has wheezes. She has no rales.       Expiratory wheezes bilaterally, decreased air movement bilaterally  Abdominal: Soft. Bowel sounds are normal. She exhibits no distension. There is no tenderness.  Musculoskeletal: Normal range of motion. She exhibits no edema.  Neurological: She is alert and oriented to person, place, and time.  Skin: Skin is warm and dry.  Psychiatric: She has a normal mood and affect.    ED Course  Procedures (including critical care time)   Date: 12/25/2011  Rate: 90  Rhythm: normal sinus rhythm  QRS Axis: normal  Intervals: normal  ST/T Wave abnormalities: normal  Conduction Disutrbances: none  Narrative Interpretation:   Old EKG Reviewed: No significant changes noted  PT just discharted 10 days ago. Not taking prednisone or avelox that was discharged on. Pt is tachycardic on exam, hypoxic, techepnic, coughing. She appears dry. Will start fluids. Nebs. Antibiotics. Labs and x-ray pending.   Results for orders placed during the hospital encounter of 12/24/11  CBC      Component Value Range   WBC 13.6 (*) 4.0 - 10.5 (K/uL)   RBC 5.37 (*) 3.87 - 5.11 (MIL/uL)   Hemoglobin 16.2 (*) 12.0 - 15.0 (g/dL)   HCT 40.9 (*) 81.1 - 46.0 (%)   MCV 88.8  78.0 - 100.0 (fL)   MCH 30.2  26.0 - 34.0 (pg)   MCHC 34.0  30.0 - 36.0 (g/dL)   RDW 91.4  78.2 - 95.6 (%)   Platelets 179  150 - 400 (K/uL)  DIFFERENTIAL      Component Value Range   Neutrophils Relative 65  43 - 77 (%)   Neutro  Abs 8.8 (*) 1.7 - 7.7 (K/uL)   Lymphocytes Relative 26  12 - 46 (%)   Lymphs Abs 3.6  0.7 - 4.0 (K/uL)   Monocytes Relative 7  3 - 12 (%)   Monocytes Absolute 1.0  0.1 - 1.0 (K/uL)   Eosinophils Relative 2  0 - 5 (%)   Eosinophils Absolute 0.2  0.0 - 0.7 (K/uL)   Basophils Relative 0  0 - 1 (%)   Basophils Absolute 0.0  0.0 -  0.1 (K/uL)  BASIC METABOLIC PANEL      Component Value Range   Sodium 134 (*) 135 - 145 (mEq/L)   Potassium 4.2  3.5 - 5.1 (mEq/L)   Chloride 98  96 - 112 (mEq/L)   CO2 27  19 - 32 (mEq/L)   Glucose, Bld 103 (*) 70 - 99 (mg/dL)   BUN 20  6 - 23 (mg/dL)   Creatinine, Ser 5.40  0.50 - 1.10 (mg/dL)   Calcium 9.1  8.4 - 98.1 (mg/dL)   GFR calc non Af Amer 61 (*) >90 (mL/min)   GFR calc Af Amer 71 (*) >90 (mL/min)  URINALYSIS, WITH MICROSCOPIC      Component Value Range   Color, Urine YELLOW  YELLOW    APPearance CLOUDY (*) CLEAR    Specific Gravity, Urine 1.020  1.005 - 1.030    pH 5.5  5.0 - 8.0    Glucose, UA NEGATIVE  NEGATIVE (mg/dL)   Hgb urine dipstick NEGATIVE  NEGATIVE    Bilirubin Urine NEGATIVE  NEGATIVE    Ketones, ur NEGATIVE  NEGATIVE (mg/dL)   Protein, ur NEGATIVE  NEGATIVE (mg/dL)   Urobilinogen, UA 0.2  0.0 - 1.0 (mg/dL)   Nitrite NEGATIVE  NEGATIVE    Leukocytes, UA TRACE (*) NEGATIVE    WBC, UA 0-2  <3 (WBC/hpf)   Bacteria, UA FEW (*) RARE    Squamous Epithelial / LPF FEW (*) RARE   CK TOTAL AND CKMB      Component Value Range   Total CK 51  7 - 177 (U/L)   CK, MB 1.9  0.3 - 4.0 (ng/mL)   Relative Index RELATIVE INDEX IS INVALID  0.0 - 2.5   POCT I-STAT TROPONIN I      Component Value Range   Troponin i, poc 0.01  0.00 - 0.08 (ng/mL)   Comment 3            Dg Chest 2 View  12/24/2011  *RADIOLOGY REPORT*  Clinical Data: 66 year old female with shortness of breath and cough.  CHEST - 2 VIEW  Comparison: 12/17/2011  Findings: The cardiomediastinal silhouette is unremarkable. 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 There is no evidence of focal  airspace disease, pulmonary edema, suspicious pulmonary nodule/mass, pleural effusion, or pneumothorax. No acute bony abnormalities are identified.  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  Original Report Authenticated By: Rosendo Gros, M.D.   1:35 AM Pt continues to have oxygen sat of low 90s on RA, desats when moving around at one pt deSATed to 80%. Pt continues to cough, Dry. Will admit.  Spoke with [facility], will come see pt.      No diagnosis found.    MDM          Lottie Mussel, PA 12/25/11 [phone]

## 2011-12-24 NOTE — ED Notes (Signed)
66 year old female with a history of COPD and recent evaluation at the hospital at which time she was prescribed Avelox and prednisone. She did not get these medications filled for a variety of reasons and over the last week has continued to wax and wane with shortness of breath wheezing and dyspnea on exertion. In addition after discharge from the hospital she developed some watery diarrhea which is gradually improving though she does feel dehydrated as her oral intake has decreased significantly.  Physical exam:  Diffuse expiratory wheezing, prolonged expiratory phase, speaks in short sentences, decreased lung sounds bilaterally. Abdomen soft, heart rate without murmur or arrhythmia, no peripheral edema, no rash.  Assessment:  Patient appears ill with COPD-type exacerbation, chest x-ray reveals no focal infiltrates or pneumothorax, has some hypoxia and will require  bronchodilator therapy, potential admission  Medical screening examination/treatment/procedure(s) were conducted as a shared visit with non-physician practitioner(s) and myself.  I personally evaluated the patient during the encounter   Vida Roller, MD 12/25/11 [phone]

## 2011-12-25 ENCOUNTER — Encounter (HOSPITAL_COMMUNITY): Payer: Self-pay | Admitting: Internal Medicine

## 2011-12-25 DIAGNOSIS — J449 Chronic obstructive pulmonary disease, unspecified: Secondary | ICD-10-CM | POA: Diagnosis present

## 2011-12-25 DIAGNOSIS — E860 Dehydration: Secondary | ICD-10-CM | POA: Diagnosis present

## 2011-12-25 LAB — URINALYSIS, MICROSCOPIC ONLY
Bilirubin Urine: NEGATIVE
Glucose, UA: NEGATIVE mg/dL
Hgb urine dipstick: NEGATIVE
Ketones, ur: NEGATIVE mg/dL
Protein, ur: NEGATIVE mg/dL
Urobilinogen, UA: 0.2 mg/dL (ref 0.0–1.0)

## 2011-12-25 LAB — BASIC METABOLIC PANEL
BUN: 20 mg/dL (ref 6–23)
CO2: 23 mEq/L (ref 19–32)
CO2: 27 mEq/L (ref 19–32)
Calcium: 9.1 mg/dL (ref 8.4–10.5)
Chloride: 101 mEq/L (ref 96–112)
Creatinine, Ser: 0.96 mg/dL (ref 0.50–1.10)
GFR calc non Af Amer: 61 mL/min — ABNORMAL LOW (ref >90)
Glucose, Bld: 103 mg/dL — ABNORMAL HIGH (ref 70–99)
Glucose, Bld: 268 mg/dL — ABNORMAL HIGH (ref 70–99)
Potassium: 4.5 mEq/L (ref 3.5–5.1)
Sodium: 134 mEq/L — ABNORMAL LOW (ref 135–145)
Sodium: 136 mEq/L (ref 135–145)

## 2011-12-25 LAB — CBC
Hemoglobin: 14.4 g/dL (ref 12.0–15.0)
RBC: 4.84 MIL/uL (ref 3.87–5.11)
WBC: 11.7 10*3/uL — ABNORMAL HIGH (ref 4.0–10.5)

## 2011-12-25 LAB — CK TOTAL AND CKMB (NOT AT ARMC)
CK, MB: 1.9 ng/mL (ref 0.3–4.0)
Relative Index: INVALID (ref 0.0–2.5)
Total CK: 51 U/L (ref 7–177)

## 2011-12-25 LAB — GLUCOSE, CAPILLARY
Glucose-Capillary: 174 mg/dL — ABNORMAL HIGH (ref 70–99)
Glucose-Capillary: 285 mg/dL — ABNORMAL HIGH (ref 70–99)

## 2011-12-25 LAB — POCT I-STAT TROPONIN I

## 2011-12-25 MED ORDER — BUDESONIDE-FORMOTEROL FUMARATE 160-4.5 MCG/ACT IN AERO
2.0000 | INHALATION_SPRAY | Freq: Two times a day (BID) | RESPIRATORY_TRACT | Status: DC
  Administered 2011-12-25 – 2012-01-01 (×14): 2 via RESPIRATORY_TRACT
  Filled 2011-12-25: qty 6

## 2011-12-25 MED ORDER — BENZONATATE 100 MG PO CAPS
100.0000 mg | ORAL_CAPSULE | Freq: Three times a day (TID) | ORAL | Status: DC | PRN
  Administered 2011-12-25 – 2011-12-26 (×2): 100 mg via ORAL
  Filled 2011-12-25 (×2): qty 1

## 2011-12-25 MED ORDER — HEPARIN SODIUM (PORCINE) 5000 UNIT/ML IJ SOLN
5000.0000 [IU] | Freq: Three times a day (TID) | INTRAMUSCULAR | Status: DC
  Administered 2011-12-25 – 2012-01-01 (×23): 5000 [IU] via SUBCUTANEOUS
  Filled 2011-12-25 (×28): qty 1

## 2011-12-25 MED ORDER — MOXIFLOXACIN HCL 400 MG PO TABS
400.0000 mg | ORAL_TABLET | Freq: Every day | ORAL | Status: DC
  Administered 2011-12-25 – 2011-12-31 (×7): 400 mg via ORAL
  Filled 2011-12-25 (×9): qty 1

## 2011-12-25 MED ORDER — ATORVASTATIN CALCIUM 20 MG PO TABS
20.0000 mg | ORAL_TABLET | Freq: Every day | ORAL | Status: DC
  Administered 2011-12-25 – 2011-12-31 (×7): 20 mg via ORAL
  Filled 2011-12-25 (×9): qty 1

## 2011-12-25 MED ORDER — ALBUTEROL SULFATE (5 MG/ML) 0.5% IN NEBU
2.5000 mg | INHALATION_SOLUTION | Freq: Once | RESPIRATORY_TRACT | Status: AC
  Administered 2011-12-25: 2.5 mg via RESPIRATORY_TRACT
  Filled 2011-12-25: qty 0.5

## 2011-12-25 MED ORDER — ALBUTEROL SULFATE (5 MG/ML) 0.5% IN NEBU
2.5000 mg | INHALATION_SOLUTION | Freq: Four times a day (QID) | RESPIRATORY_TRACT | Status: DC
  Administered 2011-12-25 (×3): 2.5 mg via RESPIRATORY_TRACT
  Filled 2011-12-25 (×3): qty 0.5

## 2011-12-25 MED ORDER — GUAIFENESIN ER 600 MG PO TB12
600.0000 mg | ORAL_TABLET | Freq: Two times a day (BID) | ORAL | Status: DC
Start: 2011-12-25 — End: 2011-12-26
  Administered 2011-12-25 (×2): 600 mg via ORAL
  Filled 2011-12-25 (×4): qty 1

## 2011-12-25 MED ORDER — SODIUM CHLORIDE 0.9 % IV SOLN
INTRAVENOUS | Status: DC
  Administered 2011-12-25: via INTRAVENOUS

## 2011-12-25 MED ORDER — ALBUTEROL SULFATE (5 MG/ML) 0.5% IN NEBU: 5.0000 mg | INHALATION_SOLUTION | RESPIRATORY_TRACT | Status: DC

## 2011-12-25 MED ORDER — SODIUM CHLORIDE 0.9 % IV SOLN
Freq: Once | INTRAVENOUS | Status: AC
  Administered 2011-12-25: via INTRAVENOUS

## 2011-12-25 MED ORDER — MOXIFLOXACIN HCL IN NACL 400 MG/250ML IV SOLN
400.0000 mg | Freq: Once | INTRAVENOUS | Status: AC
  Administered 2011-12-25: 400 mg via INTRAVENOUS
  Filled 2011-12-25: qty 250

## 2011-12-25 MED ORDER — HYDROCHLOROTHIAZIDE 25 MG PO TABS
25.0000 mg | ORAL_TABLET | Freq: Every day | ORAL | Status: DC
  Administered 2011-12-25 – 2011-12-26 (×2): 25 mg via ORAL
  Filled 2011-12-25 (×4): qty 1

## 2011-12-25 MED ORDER — ONDANSETRON HCL 4 MG PO TABS
4.0000 mg | ORAL_TABLET | Freq: Four times a day (QID) | ORAL | Status: DC | PRN
  Administered 2011-12-26 – 2011-12-29 (×2): 4 mg via ORAL
  Filled 2011-12-25 (×2): qty 1

## 2011-12-25 MED ORDER — ALBUTEROL SULFATE (5 MG/ML) 0.5% IN NEBU: 2.5000 mg | INHALATION_SOLUTION | RESPIRATORY_TRACT | Status: DC | PRN

## 2011-12-25 MED ORDER — ONDANSETRON HCL 4 MG/2ML IJ SOLN: 4.0000 mg | Freq: Three times a day (TID) | INTRAMUSCULAR | Status: DC | PRN

## 2011-12-25 MED ORDER — ACETAMINOPHEN 650 MG RE SUPP: 650.0000 mg | Freq: Four times a day (QID) | RECTAL | Status: DC | PRN

## 2011-12-25 MED ORDER — LISINOPRIL 20 MG PO TABS
20.0000 mg | ORAL_TABLET | Freq: Every day | ORAL | Status: DC
  Administered 2011-12-25 – 2011-12-26 (×2): 20 mg via ORAL
  Filled 2011-12-25 (×4): qty 1

## 2011-12-25 MED ORDER — LISINOPRIL-HYDROCHLOROTHIAZIDE 20-25 MG PO TABS: 1.0000 | ORAL_TABLET | Freq: Every day | ORAL | Status: DC

## 2011-12-25 MED ORDER — SODIUM CHLORIDE 0.9 % IV SOLN
Freq: Once | INTRAVENOUS | Status: AC
  Administered 2011-12-25: 06:00:00 via INTRAVENOUS

## 2011-12-25 MED ORDER — HYDROCOD POLST-CHLORPHEN POLST 10-8 MG/5ML PO LQCR
5.0000 mL | Freq: Once | ORAL | Status: AC
  Administered 2011-12-25: 5 mL via ORAL
  Filled 2011-12-25: qty 5

## 2011-12-25 MED ORDER — ONDANSETRON HCL 4 MG/2ML IJ SOLN: 4.0000 mg | Freq: Four times a day (QID) | INTRAMUSCULAR | Status: DC | PRN

## 2011-12-25 MED ORDER — ACETAMINOPHEN 325 MG PO TABS
650.0000 mg | ORAL_TABLET | Freq: Four times a day (QID) | ORAL | Status: DC | PRN
  Administered 2011-12-25: 650 mg via ORAL
  Filled 2011-12-25: qty 2

## 2011-12-25 MED ORDER — IPRATROPIUM BROMIDE 0.02 % IN SOLN
0.5000 mg | Freq: Four times a day (QID) | RESPIRATORY_TRACT | Status: DC
  Administered 2011-12-25 (×3): 0.5 mg via RESPIRATORY_TRACT
  Filled 2011-12-25 (×3): qty 2.5

## 2011-12-25 NOTE — Evaluation (Signed)
Physical Therapy Evaluation Patient Details Name: Victoria Bird MRN: [id number] DOB: 01-21-1946 Today's Date: 12/25/2011  Problem List:  Patient Active Problem List  Diagnoses  . Respiratory failure, acute  . Leukocytosis  . Diabetes mellitus  . Hypertension  . Dehydration  . COPD (chronic obstructive pulmonary disease)    Past Medical History:  Past Medical History  Diagnosis Date  . Diabetes mellitus   . Hypertension   . COPD (chronic obstructive pulmonary disease)   . Bronchiectasis   . Chronic pain   . Anxiety    Past Surgical History:  Past Surgical History  Procedure Date  . Appendectomy   . Cholecystectomy   . Abdominal hysterectomy   . Joint replacement   . Fracture surgery     PT Assessment/Plan/Recommendation PT Assessment Clinical Impression Statement: Pt presents with dehydration and decreased mobility and endurance.  Pt tolerates ambulation well, however with noted SOB upon ambulation.  O2 sats remained in the 90's throughout ambulation.  Pt will benefit from skilled PT in acute venue to address deficits.  PT recommends HHPT vs no follow up pending pt progress.   PT Recommendation/Assessment: Patient will need skilled PT in the acute care venue PT Problem List: Decreased activity tolerance;Decreased balance;Decreased mobility;Pain;Decreased knowledge of use of DME (May need to trial amb with cane for increased stability. ) PT Therapy Diagnosis : Difficulty walking;Acute pain PT Plan PT Frequency: Min 3X/week PT Treatment/Interventions: Gait training;Stair training;DME instruction;Functional mobility training;Therapeutic activities;Therapeutic exercise;Balance training;Patient/family education PT Recommendation Follow Up Recommendations: Home health PT;No PT follow up Equipment Recommended: None recommended by PT (May need cane, pending progress) PT Goals  Acute Rehab PT Goals PT Goal Formulation: With patient Time For Goal Achievement: 2 weeks Pt  will Ambulate: >150 feet;with modified independence;with least restrictive assistive device;Other (comment) (with O2 sats >90% on room air) PT Goal: Ambulate - Progress: Goal set today Pt will Go Up / Down Stairs: 6-9 stairs;with modified independence;with least restrictive assistive device PT Goal: Up/Down Stairs - Progress: Goal set today Pt will Perform Home Exercise Program: Independently PT Goal: Perform Home Exercise Program - Progress: Goal set today  PT Evaluation Precautions/Restrictions    Prior Functioning  Home Living Lives With: Other (Comment) Lucila Maine, youngest daughter) Dolores Lory Help From: Family Type of Home: Mobile home Home Layout: One level Home Access: Stairs to enter Entrance Stairs-Rails: Right Entrance Stairs-Number of Steps: 7 Home Adaptive Equipment: None Prior Function Level of Independence: Independent with basic ADLs;Independent with gait;Independent with transfers Driving: Yes Vocation: Retired Financial risk analyst Arousal/Alertness: Awake/alert Overall Cognitive Status: Appears within functional limits for tasks assessed Sensation/Coordination Sensation Light Touch: Appears Intact Coordination Gross Motor Movements are Fluid and Coordinated: Yes Extremity Assessment RLE Assessment RLE Assessment: Within Functional Limits LLE Assessment LLE Assessment: Within Functional Limits Mobility (including Balance) Bed Mobility Bed Mobility: Yes Supine to Sit: 6: Modified independent (Device/Increase time) Transfers Transfers: Yes Sit to Stand: 6: Modified independent (Device/Increase time);With upper extremity assist;From chair/3-in-1 Stand to Sit: 6: Modified independent (Device/Increase time);With upper extremity assist;With armrests;To chair/3-in-1 Ambulation/Gait Ambulation/Gait: Yes Ambulation/Gait Assistance: 4: Min assist Ambulation/Gait Assistance Details (indicate cue type and reason): Min/guard for slight unsteadiness.  Noted pt with SOB  while ambulating, attempted to take O2 sats on room air, however sensor not working.  Obtained new sensor and ambulated pt again.  O2 sats remained in 90's throughout ambulation.   Ambulation Distance (Feet): 200 Feet (then another 200') Assistive device: 1 person hand held assist Gait Pattern: Within Functional Limits Gait velocity:  WFL Stairs: No Naval architect Mobility: No    Exercise    End of Session PT - End of Session Activity Tolerance: Patient limited by fatigue Patient left: in chair;with call bell in reach Nurse Communication: Mobility status for transfers;Mobility status for ambulation General Behavior During Session: [facility] for tasks performed Cognition: [facility] for tasks performed  Page, Meribeth Mattes 12/25/2011, 3:03 PM

## 2011-12-25 NOTE — H&P (Signed)
PCP:  PCP in [place] that she has just established with     Chief Complaint:  Nausea, vomiting, diarrhea  HPI: 65yoF with h/o HTN, DM, COPD presents as readmission for recent COPD exacerbation with  inability to fill meds, nausea, vomiting, diarrhea, abd pain.   Pt was last admitted to [facility] on 3/14 with COPD exacerbation, leukocytosis. Started  avelox, IV steroids. CXR did not show any infiltrate. She was discharged 3/18 on steroid  taper and 3 more doses of PO avelox. Now   She went home and took Prednisone for another day, however stopped taking it thereafter because  of "whole body pain" in her "muscles and bones." She also developed Tuesday nausea and  vomiting, and some diarrhea, with associated abdominal pain. However, she also relates her  abdominal pain may be due to coughing so hard, as she notes that she'll eat something and it  will cause her to cough so hard that her abdomen and ribs will start hurting. The diarrhea  persisted through the week but has improved through today. She feels warm and notes an episode  of sweating at night but hasn't measured temp. Overall, her dyspnea doesn't seem to be as bad  and is not as big a concern to her, but the cough is severe and her main complaint. No chest  pain. No sick contacts. She has not smoked since leaving the hospital last.   She presented to the ED because she went to pharmacy to have the avelox filled and wasn't able  to due to Medicaid issues, and tried for a couple days but not able. She got a call from a  nurse in the hospital who referred her to the ED. In the ED labs significant for hypoNa 134,  negative cardiac enzymes. WBC 13.6 up from 10.3 previously. UA negative for infection. CXR with  COPD/emphysema but nothing acute.   ROS as above. No dysuria. Otherwise negative.   Past Medical History  Diagnosis Date  . Diabetes mellitus   . Hypertension   . COPD (chronic obstructive pulmonary disease)   .  Bronchiectasis   . Chronic pain   . Anxiety     Past Surgical History  Procedure Date  . Appendectomy   . Cholecystectomy   . Abdominal hysterectomy   . Joint replacement   . Fracture surgery     Medications:  HOME MEDS: Reconciled by name  Prior to Admission medications   Medication Sig Start Date End Date Taking? Authorizing Provider  albuterol (PROVENTIL HFA;VENTOLIN HFA) 108 (90 BASE) MCG/ACT inhaler Inhale 2 puffs into the lungs every 6 (six) hours as needed. For shortness of breath. 12/18/11  Yes Leroy Sea, MD  ALPRAZolam Prudy Feeler) 0.5 MG tablet Take 0.5 mg by mouth 3 (three) times daily as needed. For anxiety.   Yes Historical Provider, MD  benzonatate (TESSALON) 100 MG capsule Take 1 capsule (100 mg total) by mouth 3 (three) times daily as needed for cough. 12/18/11 12/25/11 Yes Leroy Sea, MD  budesonide-formoterol (SYMBICORT) 160-4.5 MCG/ACT inhaler Inhale 2 puffs into the lungs 2 (two) times daily.   Yes Historical Provider, MD  lisinopril-hydrochlorothiazide (PRINZIDE,ZESTORETIC) 20-25 MG per tablet Take 1 tablet by mouth daily.   Yes Historical Provider, MD  metFORMIN (GLUCOPHAGE) 500 MG tablet Take 1,000 mg by mouth 2 (two) times daily with a meal.   Yes Historical Provider, MD  predniSONE (DELTASONE) 5 MG tablet Take 1 tablet (5 mg total) by mouth daily. Label  & dispense  according to the schedule below. 8 Pills PO for 3 days, 6 Pills PO for 3 days, 4 Pills PO for 3 days, 2 Pills PO for 3 days, 1 Pills PO for 3 days, 1/2 Pill  PO for 3 days then STOP. 12/18/11 12/28/11 Yes Leroy Sea, MD  rosuvastatin (CRESTOR) 10 MG tablet Take 10 mg by mouth daily.   Yes Historical Provider, MD  tiotropium (SPIRIVA) 18 MCG inhalation capsule Place 18 mcg into inhaler and inhale daily.   Yes Historical Provider, MD    Allergies:  Allergies  Allergen Reactions  . Aspartame And Phenylalanine Nausea And Vomiting    Patient says she is allergic to all artificial sweeteners    . Codeine   . Penicillins   . Prednisone   . Sulfa Antibiotics     Social History:   reports that she has been smoking Cigarettes.  She has a 39 pack-year smoking history. She has never used smokeless tobacco. She reports that she does not drink alcohol or use illicit drugs.  Family History: Family History  Problem Relation Age of Onset  . Heart attack Mother     Physical Exam: Filed Vitals:   12/25/11 0116 12/25/11 0130 12/25/11 0145 12/25/11 0225  BP:  111/43  124/69  Pulse:  103 109 108  Temp:    98.3 F (36.8 C)  TempSrc:    Oral  Resp:  25 26 20   Height:    5' 3.5" (1.613 m)  Weight:    75.615 kg (166 lb 11.2 oz)  SpO2: 95% 93% 92% 89%   Blood pressure 124/69, pulse 108, temperature 98.3 F (36.8 C), temperature source Oral, resp. rate 20, height 5' 3.5" (1.613 m), weight 75.615 kg (166 lb 11.2 oz), SpO2 89.00%. Gen: Middle aged appearing F in no distress, no increased WOB, able to speak full sentences,  appears well. Able to relate history well but moderately meandering HEENT: Pupils round, EOMI, sclera clear and normal. Mouth normal appearing  Lungs: Fair but not good air movement, grossly quite clear without typical COPD type sounds Heart: Regular, not tachycardic, no m/g Abd: Overweight a bit but soft, non distended, minimally subjective tender to palpation, but  frank facial grimacing, fairly underwhelming Extrem: Warm, perfusing normally, no BLE edema noted, normal bulk and tone Neuro: Alert, attentive, Cn 2-12 intact, moves extremities on her own, able to sit up on her  own, grossly non-focal   Labs & Imaging Results for orders placed during the hospital encounter of 12/24/11 (from the past 48 hour(s))  CBC     Status: Abnormal   Collection Time   12/24/11 11:31 PM      Component Value Range Comment   WBC 13.6 (*) 4.0 - 10.5 (K/uL)    RBC 5.37 (*) 3.87 - 5.11 (MIL/uL)    Hemoglobin 16.2 (*) 12.0 - 15.0 (g/dL)    HCT 16.1 (*) 09.6 - 46.0 (%)    MCV 88.8   78.0 - 100.0 (fL)    MCH 30.2  26.0 - 34.0 (pg)    MCHC 34.0  30.0 - 36.0 (g/dL)    RDW 04.5  40.9 - 81.1 (%)    Platelets 179  150 - 400 (K/uL)   DIFFERENTIAL     Status: Abnormal   Collection Time   12/24/11 11:31 PM      Component Value Range Comment   Neutrophils Relative 65  43 - 77 (%)    Neutro Abs 8.8 (*) 1.7 - 7.7 (K/uL)  Lymphocytes Relative 26  12 - 46 (%)    Lymphs Abs 3.6  0.7 - 4.0 (K/uL)    Monocytes Relative 7  3 - 12 (%)    Monocytes Absolute 1.0  0.1 - 1.0 (K/uL)    Eosinophils Relative 2  0 - 5 (%)    Eosinophils Absolute 0.2  0.0 - 0.7 (K/uL)    Basophils Relative 0  0 - 1 (%)    Basophils Absolute 0.0  0.0 - 0.1 (K/uL)   BASIC METABOLIC PANEL     Status: Abnormal   Collection Time   12/24/11 11:31 PM      Component Value Range Comment   Sodium 134 (*) 135 - 145 (mEq/L)    Potassium 4.2  3.5 - 5.1 (mEq/L)    Chloride 98  96 - 112 (mEq/L)    CO2 27  19 - 32 (mEq/L)    Glucose, Bld 103 (*) 70 - 99 (mg/dL)    BUN 20  6 - 23 (mg/dL)    Creatinine, Ser 9.60  0.50 - 1.10 (mg/dL)    Calcium 9.1  8.4 - 10.5 (mg/dL)    GFR calc non Af Amer 61 (*) >90 (mL/min)    GFR calc Af Amer 71 (*) >90 (mL/min)   CK TOTAL AND CKMB     Status: Normal   Collection Time   12/24/11 11:31 PM      Component Value Range Comment   Total CK 51  7 - 177 (U/L)    CK, MB 1.9  0.3 - 4.0 (ng/mL)    Relative Index RELATIVE INDEX IS INVALID  0.0 - 2.5    POCT I-STAT TROPONIN I     Status: Normal   Collection Time   12/24/11 11:42 PM      Component Value Range Comment   Troponin i, poc 0.01  0.00 - 0.08 (ng/mL)    Comment 3            URINALYSIS, WITH MICROSCOPIC     Status: Abnormal   Collection Time   12/25/11 12:43 AM      Component Value Range Comment   Color, Urine YELLOW  YELLOW     APPearance CLOUDY (*) CLEAR     Specific Gravity, Urine 1.020  1.005 - 1.030     pH 5.5  5.0 - 8.0     Glucose, UA NEGATIVE  NEGATIVE (mg/dL)    Hgb urine dipstick NEGATIVE  NEGATIVE     Bilirubin  Urine NEGATIVE  NEGATIVE     Ketones, ur NEGATIVE  NEGATIVE (mg/dL)    Protein, ur NEGATIVE  NEGATIVE (mg/dL)    Urobilinogen, UA 0.2  0.0 - 1.0 (mg/dL)    Nitrite NEGATIVE  NEGATIVE     Leukocytes, UA TRACE (*) NEGATIVE     WBC, UA 0-2  <3 (WBC/hpf)    Bacteria, UA FEW (*) RARE     Squamous Epithelial / LPF FEW (*) RARE     Dg Chest 2 View  12/24/2011  *RADIOLOGY REPORT*  Clinical Data: 66 year old female with shortness of breath and cough.  CHEST - 2 VIEW  Comparison: 12/17/2011  Findings: The cardiomediastinal silhouette is unremarkable. 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 There is no evidence of focal airspace disease, pulmonary edema, suspicious pulmonary nodule/mass, pleural effusion, or pneumothorax. No acute bony abnormalities are identified.  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  Original Report Authenticated By: Rosendo Gros, M.D.   ECG: NSR 97 bpm, normal axis, overall  this is a very normal ECG.     Impression Present on Admission:  .Dehydration .COPD with acute exacerbation .COPD (chronic obstructive pulmonary disease) .Diabetes mellitus .Hypertension .Leukocytosis  65yoF with h/o HTN, DM, COPD presents as readmission for recent COPD exacerbation with  inability to fill meds, nausea, vomiting, diarrhea, abd pain.   1. COPD exacerbation: This doesn't seem to be overwhelmingly the reason for her presentation as  her dyspnea seems stable and not worse. Given her reported intolerance to prednisone, I'm  hesitant to restart this and will restart the avelox to finish the course, and give scheduled  aggressive nebs. Her priority seems to be getting the cough under control.  - Nebs, avelox, mucinex, tessalon. Continue home symbicort  2. Nausea, vomiting, diarrhea, abdominal pain: Frankly I'm not sure the etiology of this as her  history is hard to get specifically. Question prednisone side effect given "whole body pain"  with this.  Gastroenteritis possible. She also relates she's having rib and abdomen pain from  coughing so hard and I wonder if her coughing fits are leading to the vomiting.   Her abdomen is benign, but she does have WBC 13, however this was present on her first  admission when she didn't have abdominal pain. Therefore the plan we came up with was to try  and control her cough and monitor for now, try to advance PO intake, and see how she does, and  if does poorly then would likely proceed with CT/contrast of her belly.  - Symptom control, IVF's at maintenance. Clear liquid diet, advance as tolerated   3. Diabetes: Metformin controlled. Controlled at present, will give SSI while admitted in case  need IV contrast. Requests not to get DM diet as she says the artifical sweeteners make her  nauseated.   4. Leukocytosis: UA and CXR negative. Abdomen a potential source, but as above she had WBC 13  previously as well. Could be steroid effect. MOnitoring.   5. HypoNa: Likely dehydration, vomiting/diarrhea. Minimal. Giving IVF's.   6. HTN: continue home lisinopril/HCTZ  SubQ heparin Regular bed, [facility] team 4 Presumed full code   Other plans as per orders.   Matthew Pais 12/25/2011, 3:36 AM

## 2011-12-25 NOTE — ED Provider Notes (Signed)
Medical screening examination/treatment/procedure(s) were conducted as a shared visit with non-physician practitioner(s) and myself.  I personally evaluated the patient during the encounter  Please see my separate respective documentation pertaining to this patient encounter   Vida Roller, MD 12/25/11 [phone]

## 2011-12-25 NOTE — Progress Notes (Signed)
Ms. Budzinski was admitted this morning. She was recently discharged from the hospital with COPD exacerbation. She was unable to get her Avelox. She came in with nausea, vomiting and diarrhea that has since resolved. She states that she feels fine now and will try some soup and sandwich for lunch. She states that the cough medicine is working and she is not coughing as much. No abdominal pain. No chest pain. Will monitor for another 24 hours. If she is able to tolerate her diet and no other symptoms she should be able to be discharged tomorrow. Molli Posey MD. [phone]

## 2011-12-26 LAB — BASIC METABOLIC PANEL
CO2: 27 mEq/L (ref 19–32)
GFR calc non Af Amer: 56 mL/min — ABNORMAL LOW (ref >90)
Glucose, Bld: 258 mg/dL — ABNORMAL HIGH (ref 70–99)
Potassium: 4 mEq/L (ref 3.5–5.1)
Sodium: 137 mEq/L (ref 135–145)

## 2011-12-26 LAB — GLUCOSE, CAPILLARY
Glucose-Capillary: 157 mg/dL — ABNORMAL HIGH (ref 70–99)
Glucose-Capillary: 143 mg/dL — ABNORMAL HIGH (ref 70–99)
Glucose-Capillary: 104 mg/dL — ABNORMAL HIGH (ref 70–99)
Glucose-Capillary: 91 mg/dL (ref 70–99)

## 2011-12-26 LAB — CBC
Hemoglobin: 13.3 g/dL (ref 12.0–15.0)
Platelets: 172 10*3/uL (ref 150–400)
RBC: 4.44 MIL/uL (ref 3.87–5.11)

## 2011-12-26 MED ORDER — METFORMIN HCL 500 MG PO TABS
1000.0000 mg | ORAL_TABLET | Freq: Two times a day (BID) | ORAL | Status: DC
  Administered 2011-12-26 – 2011-12-27 (×3): 1000 mg via ORAL
  Filled 2011-12-26 (×6): qty 2

## 2011-12-26 MED ORDER — HYDROCOD POLST-CHLORPHEN POLST 10-8 MG/5ML PO LQCR
5.0000 mL | Freq: Two times a day (BID) | ORAL | Status: DC
Start: 2011-12-26 — End: 2012-01-01
  Administered 2011-12-27 – 2012-01-01 (×12): 5 mL via ORAL
  Filled 2011-12-26 (×13): qty 5

## 2011-12-26 MED ORDER — OXYCODONE-ACETAMINOPHEN 5-325 MG PO TABS
1.0000 | ORAL_TABLET | ORAL | Status: DC | PRN
  Administered 2011-12-26 – 2011-12-31 (×4): 1 via ORAL
  Administered 2011-12-31 – 2012-01-01 (×2): 2 via ORAL
  Administered 2012-01-01: 1 via ORAL
  Filled 2011-12-26 (×2): qty 2
  Filled 2011-12-26 (×3): qty 1
  Filled 2011-12-26: qty 2
  Filled 2011-12-26: qty 1

## 2011-12-26 MED ORDER — GUAIFENESIN ER 600 MG PO TB12
1200.0000 mg | ORAL_TABLET | Freq: Two times a day (BID) | ORAL | Status: DC
  Administered 2011-12-26 – 2012-01-01 (×13): 1200 mg via ORAL
  Filled 2011-12-26 (×16): qty 2

## 2011-12-26 MED ORDER — ALBUTEROL SULFATE HFA 108 (90 BASE) MCG/ACT IN AERS
2.0000 | INHALATION_SPRAY | Freq: Four times a day (QID) | RESPIRATORY_TRACT | Status: DC
  Administered 2011-12-26 – 2011-12-28 (×8): 2 via RESPIRATORY_TRACT
  Filled 2011-12-26: qty 6.7

## 2011-12-26 MED ORDER — TIOTROPIUM BROMIDE MONOHYDRATE 18 MCG IN CAPS
18.0000 ug | ORAL_CAPSULE | Freq: Every day | RESPIRATORY_TRACT | Status: DC
  Administered 2011-12-26 – 2012-01-01 (×6): 18 ug via RESPIRATORY_TRACT
  Filled 2011-12-26 (×2): qty 5

## 2011-12-26 MED ORDER — BIOTENE DRY MOUTH MT LIQD
15.0000 mL | Freq: Two times a day (BID) | OROMUCOSAL | Status: DC
  Administered 2011-12-26 – 2012-01-01 (×12): 15 mL via OROMUCOSAL

## 2011-12-26 MED ORDER — ALBUTEROL SULFATE HFA 108 (90 BASE) MCG/ACT IN AERS
2.0000 | INHALATION_SPRAY | RESPIRATORY_TRACT | Status: DC | PRN
  Filled 2011-12-26: qty 6.7

## 2011-12-26 MED ORDER — BENZONATATE 100 MG PO CAPS
200.0000 mg | ORAL_CAPSULE | Freq: Three times a day (TID) | ORAL | Status: DC | PRN
  Administered 2011-12-26: 100 mg via ORAL
  Filled 2011-12-26: qty 2

## 2011-12-26 MED ORDER — BENZONATATE 100 MG PO CAPS
200.0000 mg | ORAL_CAPSULE | Freq: Three times a day (TID) | ORAL | Status: DC
Start: 2011-12-26 — End: 2012-01-01
  Administered 2011-12-26 – 2012-01-01 (×20): 200 mg via ORAL
  Filled 2011-12-26 (×24): qty 2

## 2011-12-26 NOTE — Progress Notes (Signed)
Subjective: States cough kept her up all night long and she is exhausted this morning, per nursing cough paroxysm this a.m. denies nausea vomiting, no diarrhea. Objective: Vital signs in last 24 hours: Temp:  [98.1 F (36.7 C)-98.2 F (36.8 C)] 98.2 F (36.8 C) (03/26 0445) Pulse Rate:  [63-73] 63  (03/26 0445) Resp:  [18-20] 18  (03/26 0445) BP: (108-109)/(61-65) 108/65 mmHg (03/26 0445) SpO2:  [91 %-96 %] 91 % (03/26 0819) Last BM Date: 12/24/11 Intake/Output from previous day: 03/25 0701 - 03/26 0700 In: 480 [P.O.:480] Out: 1600 [Urine:1600] Intake/Output this shift:      General Appearance:    Alert, cooperative, coughing continuously, appears stated age  Lungs:     decreased air entry bilaterally, no wheezes. respirations unlabored   Heart:    Regular rate and rhythm, S1 and S2 normal, no murmur, rub   or gallop  Abdomen:     Soft, non-tender, bowel sounds active all four quadrants,    no masses, no organomegaly  Extremities:   Extremities normal, atraumatic, no cyanosis or edema  Neurologic:   CNII-XII intact, normal strength, sensation and reflexes    throughout    Weight change:   Intake/Output Summary (Last 24 hours) at 12/26/11 1034 Last data filed at 12/26/11 0700  Gross per 24 hour  Intake    480 ml  Output   1600 ml  Net  -1120 ml    Lab Results:   Basename 12/26/11 0348 12/25/11 0500  NA 137 136  K 4.0 4.5  CL 102 101  CO2 27 23  GLUCOSE 258* 268*  BUN 29* 22  CREATININE 1.04 0.96  CALCIUM 9.2 8.6    Basename 12/26/11 0348 12/25/11 0500  WBC 13.1* 11.7*  HGB 13.3 14.4  HCT 40.6 43.3  PLT 172 164  MCV 91.4 89.5   PT/INR No results found for this basename: LABPROT:2,INR:2 in the last 72 hours ABG No results found for this basename: PHART:2,PCO2:2,PO2:2,HCO3:2 in the last 72 hours  Micro Results: No results found for this or any previous visit (from the past 240 hour(s)). Studies/Results: Dg Chest 2 View  12/24/2011  *RADIOLOGY  REPORT*  Clinical Data: 66 year old female with shortness of breath and cough.  CHEST - 2 VIEW  Comparison: 12/17/2011  Findings: The cardiomediastinal silhouette is unremarkable. 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 There is no evidence of focal airspace disease, pulmonary edema, suspicious pulmonary nodule/mass, pleural effusion, or pneumothorax. No acute bony abnormalities are identified.  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  Original Report Authenticated By: Rosendo Gros, M.D.   Medications: Scheduled Meds:   . albuterol  2 puff Inhalation Q6H WA  . antiseptic oral rinse  15 mL Mouth Rinse BID  . atorvastatin  20 mg Oral q1800  . benzonatate  200 mg Oral TID  . budesonide-formoterol  2 puff Inhalation BID  . guaiFENesin  1,200 mg Oral BID  . heparin  5,000 Units Subcutaneous Q8H  . hydrochlorothiazide  25 mg Oral Daily  . lisinopril  20 mg Oral Daily  . metFORMIN  1,000 mg Oral BID WC  . moxifloxacin  400 mg Oral q1800  . tiotropium  18 mcg Inhalation Daily  . DISCONTD: sodium chloride   Intravenous STAT  . DISCONTD: albuterol  2.5 mg Nebulization Q6H  . DISCONTD: guaiFENesin  600 mg Oral BID  . DISCONTD: ipratropium  0.5 mg Nebulization Q6H   Continuous Infusions:  PRN Meds:.acetaminophen, acetaminophen, albuterol, albuterol, ondansetron (ZOFRAN) IV, ondansetron, oxyCODONE-acetaminophen, DISCONTD:  benzonatate, DISCONTD: benzonatate Assessment/Plan: Patient Active Hospital Problem List: .COPD exacerbation (chronic obstructive pulmonary disease) -Will continue nebs, antibiotics Mucinex and Tessalon.  -We'll also add Tussionex given her cough paroxysms  -Holding off prednisone as patient reported intolerance -she had been discharged on it prior to this admission.  .Diabetes mellitus (12/14/2011) -Will resume metformin, continue Accu-Cheks.    Hypertension (12/14/2011) -continue outpatient medications.  .Nausea, vomiting, diarrhea, abdominal  pain-likely gastroenteritis -Resolved with supportive care. Leukocytosis -Likely steroid effect from her recent steroid use as noted above.  LOS: 2 days   Victoria Bird C 12/26/2011, 10:34 AM

## 2011-12-27 DIAGNOSIS — R110 Nausea: Secondary | ICD-10-CM | POA: Diagnosis present

## 2011-12-27 LAB — GLUCOSE, CAPILLARY
Glucose-Capillary: 83 mg/dL (ref 70–99)
Glucose-Capillary: 97 mg/dL (ref 70–99)
Glucose-Capillary: 88 mg/dL (ref 70–99)

## 2011-12-27 LAB — CBC
HCT: 43.7 % (ref 36.0–46.0)
Hemoglobin: 14.1 g/dL (ref 12.0–15.0)
MCH: 29.4 pg (ref 26.0–34.0)
MCHC: 32.3 g/dL (ref 30.0–36.0)
RBC: 4.79 MIL/uL (ref 3.87–5.11)

## 2011-12-27 MED ORDER — INSULIN ASPART 100 UNIT/ML ~~LOC~~ SOLN
0.0000 [IU] | Freq: Three times a day (TID) | SUBCUTANEOUS | Status: DC
  Administered 2011-12-28 – 2011-12-30 (×4): 1 [IU] via SUBCUTANEOUS
  Administered 2011-12-30: 2 [IU] via SUBCUTANEOUS
  Administered 2011-12-31: 1 [IU] via SUBCUTANEOUS

## 2011-12-27 MED ORDER — PANTOPRAZOLE SODIUM 40 MG PO TBEC
40.0000 mg | DELAYED_RELEASE_TABLET | Freq: Every day | ORAL | Status: DC
  Administered 2011-12-27 – 2011-12-30 (×4): 40 mg via ORAL
  Filled 2011-12-27 (×5): qty 1

## 2011-12-27 MED ORDER — SODIUM CHLORIDE 0.9 % IV SOLN
INTRAVENOUS | Status: AC
  Administered 2011-12-27: 23:00:00 via INTRAVENOUS
  Administered 2011-12-27: 1000 mL via INTRAVENOUS

## 2011-12-27 NOTE — Progress Notes (Signed)
Subjective: States cough with some improvement from yesterday. Patient also c/o nausea usually when she eats and feels full. Objective: Vital signs in last 24 hours: Temp:  [98.8 F (37.1 C)-99.3 F (37.4 C)] 99 F (37.2 C) (03/27 1425) Pulse Rate:  [70-85] 74  (03/27 1425) Resp:  [20] 20  (03/27 1425) BP: (92-116)/(45-64) 93/53 mmHg (03/27 1425) SpO2:  [84 %-97 %] 88 % (03/27 1449) Weight:  [76.885 kg (169 lb 8 oz)] 76.885 kg (169 lb 8 oz) (03/27 0530) Last BM Date: 12/24/11 Intake/Output from previous day: 03/26 0701 - 03/27 0700 In: 720 [P.O.:720] Out: 2150 [Urine:2150] Intake/Output this shift: Total I/O In: 480 [P.O.:480] Out: -     General Appearance:    Alert, cooperative, coughing continuously, appears stated age  Lungs:     decreased air entry bilaterally, no wheezes. respirations unlabored   Heart:    Regular rate and rhythm, S1 and S2 normal, no murmur, rub   or gallop  Abdomen:     Soft, non-tender, bowel sounds active all four quadrants,    no masses, no organomegaly  Extremities:   Extremities normal, atraumatic, no cyanosis or edema  Neurologic:   CNII-XII intact, normal strength, sensation and reflexes    throughout    Weight change:   Intake/Output Summary (Last 24 hours) at 12/27/11 1726 Last data filed at 12/27/11 1239  Gross per 24 hour  Intake    960 ml  Output   2150 ml  Net  -1190 ml    Lab Results:   Basename 12/26/11 0348 12/25/11 0500  NA 137 136  K 4.0 4.5  CL 102 101  CO2 27 23  GLUCOSE 258* 268*  BUN 29* 22  CREATININE 1.04 0.96  CALCIUM 9.2 8.6    Basename 12/27/11 0350 12/26/11 0348  WBC 10.8* 13.1*  HGB 14.1 13.3  HCT 43.7 40.6  PLT 164 172  MCV 91.2 91.4   PT/INR No results found for this basename: LABPROT:2,INR:2 in the last 72 hours ABG No results found for this basename: PHART:2,PCO2:2,PO2:2,HCO3:2 in the last 72 hours  Micro Results: No results found for this or any previous visit (from the past 240  hour(s)). Studies/Results: No results found. Medications: Scheduled Meds:    . albuterol  2 puff Inhalation Q6H WA  . antiseptic oral rinse  15 mL Mouth Rinse BID  . atorvastatin  20 mg Oral q1800  . benzonatate  200 mg Oral TID  . budesonide-formoterol  2 puff Inhalation BID  . chlorpheniramine-HYDROcodone  5 mL Oral Q12H  . guaiFENesin  1,200 mg Oral BID  . heparin  5,000 Units Subcutaneous Q8H  . moxifloxacin  400 mg Oral q1800  . pantoprazole  40 mg Oral Q1200  . tiotropium  18 mcg Inhalation Daily  . DISCONTD: hydrochlorothiazide  25 mg Oral Daily  . DISCONTD: lisinopril  20 mg Oral Daily  . DISCONTD: metFORMIN  1,000 mg Oral BID WC   Continuous Infusions:    . sodium chloride 1,000 mL (12/27/11 1700)   PRN Meds:.acetaminophen, acetaminophen, albuterol, albuterol, ondansetron (ZOFRAN) IV, ondansetron, oxyCODONE-acetaminophen Assessment/Plan: Patient Active Hospital Problem List: .COPD exacerbation (chronic obstructive pulmonary disease) -Will continue nebs, antibiotics Mucinex and Tessalon.  - Tussionex  Added yesterday, given her cough paroxysms. Will also place on PPI. Follow -Holding off prednisone as patient reported intolerance -she had been discharged on it prior to this admission.  Nausea Associated with meals and feeling full. Patient with hx of DM will get a gastric  emptying study to r/o gastroparesis. Place on PPI. Symptomatic treatment. .Diabetes mellitus (12/14/2011) - d/c  metformin, continue Accu-Cheks. SSI   Hypertension (12/14/2011) - borderline BP likely secondary to dehydration. Will d/c BP meds and place on IVF. Follow. .Nausea, vomiting, diarrhea, abdominal pain-likely gastroenteritis -Resolved with supportive care.check a gastric emptying study to r/o gastroparesis. Leukocytosis -Likely steroid effect from her recent steroid use as noted above.  LOS: 3 days   [facility] 12/27/2011, 5:26 PM

## 2011-12-27 NOTE — Progress Notes (Signed)
PT Cancellation Note  Treatment cancelled today due to patient's refusal to participate.  Pt reports not feeling well today and would like to remain in bed.  Gina Costilla,[name] 12/27/2011, 3:44 PM Pager: [phone]

## 2011-12-28 ENCOUNTER — Inpatient Hospital Stay (HOSPITAL_COMMUNITY): Payer: Medicaid Other

## 2011-12-28 ENCOUNTER — Encounter (HOSPITAL_COMMUNITY): Payer: Self-pay

## 2011-12-28 LAB — BASIC METABOLIC PANEL
CO2: 26 mEq/L (ref 19–32)
Calcium: 8.3 mg/dL — ABNORMAL LOW (ref 8.4–10.5)
Chloride: 105 mEq/L (ref 96–112)
Creatinine, Ser: 0.92 mg/dL (ref 0.50–1.10)
GFR calc Af Amer: 75 mL/min — ABNORMAL LOW (ref >90)
Sodium: 136 mEq/L (ref 135–145)

## 2011-12-28 LAB — HEMOGLOBIN A1C: Mean Plasma Glucose: 131 mg/dL — ABNORMAL HIGH (ref <117)

## 2011-12-28 LAB — CBC
Platelets: 140 10*3/uL — ABNORMAL LOW (ref 150–400)
RBC: 4.53 MIL/uL (ref 3.87–5.11)
RDW: 13.8 % (ref 11.5–15.5)
WBC: 8.3 10*3/uL (ref 4.0–10.5)

## 2011-12-28 MED ORDER — TECHNETIUM TC 99M SULFUR COLLOID
2.1000 | Freq: Once | INTRAVENOUS | Status: AC | PRN
  Administered 2011-12-28: 2.1 via INTRAVENOUS

## 2011-12-28 MED ORDER — SODIUM CHLORIDE 0.9 % IV SOLN
INTRAVENOUS | Status: DC
  Administered 2011-12-28 – 2011-12-29 (×4): via INTRAVENOUS

## 2011-12-28 NOTE — Progress Notes (Signed)
Subjective: States cough with some improvement from yesterday and okay on cough medications.  Objective: Vital signs in last 24 hours: Temp:  [98.4 F (36.9 C)-98.6 F (37 C)] 98.4 F (36.9 C) (03/28 1342) Pulse Rate:  [69-88] 88  (03/28 1342) Resp:  [17-18] 18  (03/28 1342) BP: (102-128)/(53-72) 122/59 mmHg (03/28 1342) SpO2:  [92 %-100 %] 100 % (03/28 1342) Weight:  [77.5 kg (170 lb 13.7 oz)] 77.5 kg (170 lb 13.7 oz) (03/28 0555) Last BM Date: 12/27/11 Intake/Output from previous day: 03/27 0701 - 03/28 0700 In: 1242.9 [P.O.:720; I.V.:522.9] Out: 2200 [Urine:2200] Intake/Output this shift: Total I/O In: 89.6 [I.V.:89.6] Out: -     General Appearance:    Alert, cooperative, coughing continuously, appears stated age  Lungs:     decreased air entry bilaterally, no wheezes. respirations unlabored. Coarse BS   Heart:    Regular rate and rhythm, S1 and S2 normal, no murmur, rub   or gallop  Abdomen:     Soft, non-tender, bowel sounds active all four quadrants,    no masses, no organomegaly  Extremities:   Extremities normal, atraumatic, no cyanosis or edema  Neurologic:   CNII-XII intact, normal strength, sensation and reflexes    throughout    Weight change: 0.615 kg (1 lb 5.7 oz)  Intake/Output Summary (Last 24 hours) at 12/28/11 1510 Last data filed at 12/28/11 1312  Gross per 24 hour  Intake  852.5 ml  Output   2200 ml  Net -1347.5 ml    Lab Results:   Basename 12/28/11 0413 12/26/11 0348  NA 136 137  K 4.5 4.0  CL 105 102  CO2 26 27  GLUCOSE 100* 258*  BUN 23 29*  CREATININE 0.92 1.04  CALCIUM 8.3* 9.2    Basename 12/28/11 0413 12/27/11 0350  WBC 8.3 10.8*  HGB 13.2 14.1  HCT 41.2 43.7  PLT 140* 164  MCV 90.9 91.2   PT/INR No results found for this basename: LABPROT:2,INR:2 in the last 72 hours ABG No results found for this basename: PHART:2,PCO2:2,PO2:2,HCO3:2 in the last 72 hours  Micro Results: No results found for this or any previous  visit (from the past 240 hour(s)). Studies/Results: Nm Gastric Emptying  12/28/2011  *RADIOLOGY REPORT*  Clinical data: Nausea, vomiting, choking with eating, history of diabetes, hiatal hernia  NUCLEAR MEDICINE GASTRIC EMPTYING EXAM:  Radiopharmaceutical:  221 mCi Tc-70m sulfur colloid labeled egg whites  Technique: The patient ingested a standardized meal containing radiolabeled egg whites. Imaging was performed in the anterior projection for 120 minutes. Gastric emptying is calculated from the obtained images.  Findings: Subjectively normal emptying of tracer from stomach during 2 hours of imaging. At 2 hours minimal residual gastric tracer is identified. Quantitative analysis reveals 69% gastric emptying at 1 hour and 96% gastric emptying at 2 hours.  Findings represent normal gastric emptying.  IMPRESSION: Normal exam.  Original Report Authenticated By: Lollie Marrow, M.D.   Medications: Scheduled Meds:    . albuterol  2 puff Inhalation Q6H WA  . antiseptic oral rinse  15 mL Mouth Rinse BID  . atorvastatin  20 mg Oral q1800  . benzonatate  200 mg Oral TID  . budesonide-formoterol  2 puff Inhalation BID  . chlorpheniramine-HYDROcodone  5 mL Oral Q12H  . guaiFENesin  1,200 mg Oral BID  . heparin  5,000 Units Subcutaneous Q8H  . insulin aspart  0-9 Units Subcutaneous TID WC  . moxifloxacin  400 mg Oral q1800  . pantoprazole  40 mg Oral Q1200  . tiotropium  18 mcg Inhalation Daily  . DISCONTD: hydrochlorothiazide  25 mg Oral Daily  . DISCONTD: lisinopril  20 mg Oral Daily  . DISCONTD: metFORMIN  1,000 mg Oral BID WC   Continuous Infusions:    . sodium chloride 125 mL/hr at 12/27/11 2235  . sodium chloride 125 mL/hr at 12/28/11 1229   PRN Meds:.acetaminophen, acetaminophen, albuterol, albuterol, ondansetron (ZOFRAN) IV, ondansetron, oxyCODONE-acetaminophen, technetium sulfur colloid Assessment/Plan: Patient Active Hospital Problem List: .COPD exacerbation (chronic obstructive  pulmonary disease) -Will continue nebs, antibiotics Mucinex and Tessalon.  - Tussionex  Added , given her cough paroxysms. Continue PPI. Follow -Holding off prednisone as patient reported intolerance -she had been discharged on it prior to this admission.  Nausea Associated with meals and feeling full. Patient with hx of DM. Gastric emptying study negative. Continue PPI. Symptomatic treatment. .Diabetes mellitus (12/14/2011) - SSI   Hypertension (12/14/2011) - borderline BP likely secondary to dehydration. IVF. Follow. .Nausea, vomiting, diarrhea, abdominal pain-likely gastroenteritis -Resolved with supportive care.Gastric emptying study negative for  gastroparesis. Leukocytosis -Likely steroid effect from her recent steroid use as noted above.  LOS: 4 days   Victoria Bird 12/28/2011, 3:10 PM

## 2011-12-29 LAB — BASIC METABOLIC PANEL
CO2: 27 mEq/L (ref 19–32)
Calcium: 8.4 mg/dL (ref 8.4–10.5)
Chloride: 106 mEq/L (ref 96–112)
Glucose, Bld: 128 mg/dL — ABNORMAL HIGH (ref 70–99)
Sodium: 137 mEq/L (ref 135–145)

## 2011-12-29 LAB — GLUCOSE, CAPILLARY
Glucose-Capillary: 103 mg/dL — ABNORMAL HIGH (ref 70–99)
Glucose-Capillary: 111 mg/dL — ABNORMAL HIGH (ref 70–99)
Glucose-Capillary: 121 mg/dL — ABNORMAL HIGH (ref 70–99)

## 2011-12-29 LAB — CBC
Hemoglobin: 12.3 g/dL (ref 12.0–15.0)
MCH: 29.6 pg (ref 26.0–34.0)
MCV: 90.6 fL (ref 78.0–100.0)
Platelets: 144 10*3/uL — ABNORMAL LOW (ref 150–400)
RBC: 4.16 MIL/uL (ref 3.87–5.11)
RDW: 13.5 % (ref 11.5–15.5)
WBC: 6.7 10*3/uL (ref 4.0–10.5)

## 2011-12-29 MED ORDER — ALBUTEROL SULFATE HFA 108 (90 BASE) MCG/ACT IN AERS
2.0000 | INHALATION_SPRAY | Freq: Four times a day (QID) | RESPIRATORY_TRACT | Status: DC | PRN
  Filled 2011-12-29: qty 6.7

## 2011-12-29 NOTE — Progress Notes (Signed)
Physical Therapy Treatment Patient Details Name: Victoria Bird MRN: [id number] DOB: September 06, 1946 Today's Date: 12/29/2011  COPD    ? Bronchitis 9:35 - 10:00 1 gt  1 ta   PT Assessment/Plan  PT - Assessment/Plan Comments on Treatment Session: Pt required MAX encouragement to participate.  Assisted pt OOB to BR then amb in hallway using [facility] for trial.  Pt did NOT like it and prefers to hold to walls/rails/furniture.  Amb on RA lowest recorded 80% but quick recovery to 92% with rest and cueing for purse lip breathing. PT Plan: Discharge plan remains appropriate Follow Up Recommendations: Home health PT Equipment Recommended: None recommended by PT;Other (comment) (Pt does not want a cane) PT Goals  Acute Rehab PT Goals PT Goal Formulation: With patient Pt will Ambulate: >150 feet;with modified independence;with least restrictive assistive device PT Goal: Ambulate - Progress: Progressing toward goal  PT Treatment Precautions/Restrictions  Precautions Precautions: Other (comment) (O2) Precaution Comments: Hx COPD currently on 2 lts O2 Required Braces or Orthoses: No Restrictions Weight Bearing Restrictions: No Mobility (including Balance) Bed Mobility Bed Mobility: Yes Supine to Sit: 6: Modified independent (Device/Increase time) Transfers Transfers: Yes Sit to Stand: 6: Modified independent (Device/Increase time) Stand to Sit: 6: Modified independent (Device/Increase time) Ambulation/Gait Ambulation/Gait: Yes Ambulation/Gait Assistance: Other (comment) (MinGuard Assist) Ambulation/Gait Assistance Details (indicate cue type and reason): Amb pt with SPC on RA.  Pt demon 2/4 DOE and lowest RA stats 80% with quick recovery to low 90's after rest.  Pt required cueing for purse lip breathing.  Pt demon unsteady slightly drunken gait and stated she did not like using the cane, she prefers to hold to wall/rail/furniture. Ambulation Distance (Feet): 250 Feet Assistive device: Straight  cane Gait Pattern: Step-through pattern Stairs: No Wheelchair Mobility Wheelchair Mobility: No    Exercise    End of Session PT - End of Session Equipment Utilized During Treatment: Gait belt Activity Tolerance: Patient limited by fatigue Patient left: in bed;with call bell in reach;Other (comment) (back on 2 lts O2) General Behavior During Session: [facility] for tasks performed Cognition: [facility] for tasks performed  Felecia Shelling  PTA [name]  Acute  Rehab Pager     [phone]

## 2011-12-29 NOTE — Progress Notes (Signed)
Subjective: Still with cough with some improvement from yesterday and okay on cough medications.  Objective: Vital signs in last 24 hours: Temp:  [97.5 F (36.4 C)-98.2 F (36.8 C)] 98.1 F (36.7 C) (03/29 1438) Pulse Rate:  [54-63] 63  (03/29 1438) Resp:  [15-16] 16  (03/29 1438) BP: (108-124)/(45-63) 110/45 mmHg (03/29 1438) SpO2:  [94 %-100 %] 99 % (03/29 1438) Weight:  [79.062 kg (174 lb 4.8 oz)] 79.062 kg (174 lb 4.8 oz) (03/29 0515) Last BM Date: 12/28/11 Intake/Output from previous day: 03/28 0701 - 03/29 0700 In: 3079.3 [P.O.:940; I.V.:2139.3] Out: 3000 [Urine:3000] Intake/Output this shift: Total I/O In: 1456.3 [P.O.:600; I.V.:856.3] Out: 800 [Urine:800]    General Appearance:    Alert, cooperative, coughing continuously, appears stated age  Lungs:     decreased air entry bilaterally, no wheezes. respirations unlabored. Coarse BS   Heart:    Regular rate and rhythm, S1 and S2 normal, no murmur, rub   or gallop  Abdomen:     Soft, non-tender, bowel sounds active all four quadrants,    no masses, no organomegaly  Extremities:   Extremities normal, atraumatic, no cyanosis or edema  Neurologic:   CNII-XII intact, normal strength, sensation and reflexes    throughout    Weight change: 1.562 kg (3 lb 7.1 oz)  Intake/Output Summary (Last 24 hours) at 12/29/11 1720 Last data filed at 12/29/11 1300  Gross per 24 hour  Intake   4446 ml  Output   3000 ml  Net   1446 ml    Lab Results:   Basename 12/29/11 0410 12/28/11 0413  NA 137 136  K 4.6 4.5  CL 106 105  CO2 27 26  GLUCOSE 128* 100*  BUN 20 23  CREATININE 0.95 0.92  CALCIUM 8.4 8.3*    Basename 12/29/11 0410 12/28/11 0413  WBC 6.7 8.3  HGB 12.3 13.2  HCT 37.7 41.2  PLT 144* 140*  MCV 90.6 90.9   PT/INR No results found for this basename: LABPROT:2,INR:2 in the last 72 hours ABG No results found for this basename: PHART:2,PCO2:2,PO2:2,HCO3:2 in the last 72 hours  Micro Results: No results  found for this or any previous visit (from the past 240 hour(s)). Studies/Results: Nm Gastric Emptying  12/28/2011  *RADIOLOGY REPORT*  Clinical data: Nausea, vomiting, choking with eating, history of diabetes, hiatal hernia  NUCLEAR MEDICINE GASTRIC EMPTYING EXAM:  Radiopharmaceutical:  221 mCi Tc-86m sulfur colloid labeled egg whites  Technique: The patient ingested a standardized meal containing radiolabeled egg whites. Imaging was performed in the anterior projection for 120 minutes. Gastric emptying is calculated from the obtained images.  Findings: Subjectively normal emptying of tracer from stomach during 2 hours of imaging. At 2 hours minimal residual gastric tracer is identified. Quantitative analysis reveals 69% gastric emptying at 1 hour and 96% gastric emptying at 2 hours.  Findings represent normal gastric emptying.  IMPRESSION: Normal exam.  Original Report Authenticated By: Lollie Marrow, M.D.   Medications: Scheduled Meds:    . antiseptic oral rinse  15 mL Mouth Rinse BID  . atorvastatin  20 mg Oral q1800  . benzonatate  200 mg Oral TID  . budesonide-formoterol  2 puff Inhalation BID  . chlorpheniramine-HYDROcodone  5 mL Oral Q12H  . guaiFENesin  1,200 mg Oral BID  . heparin  5,000 Units Subcutaneous Q8H  . insulin aspart  0-9 Units Subcutaneous TID WC  . moxifloxacin  400 mg Oral q1800  . pantoprazole  40 mg Oral Q1200  .  tiotropium  18 mcg Inhalation Daily  . DISCONTD: albuterol  2 puff Inhalation Q6H WA   Continuous Infusions:    . sodium chloride 125 mL/hr at 12/29/11 1228   PRN Meds:.acetaminophen, acetaminophen, albuterol, albuterol, albuterol, ondansetron (ZOFRAN) IV, ondansetron, oxyCODONE-acetaminophen Assessment/Plan: Patient Active Hospital Problem List: .COPD exacerbation (chronic obstructive pulmonary disease) -Will continue nebs, antibiotics Mucinex and Tessalon.  - Tussionex  Added , given her cough paroxysms. Continue PPI. Follow -Holding off  prednisone as patient reported intolerance -she had been discharged on it prior to this admission.  Nausea Associated with meals and feeling full. Improving. Patient with hx of DM. Gastric emptying study negative. Continue PPI. Symptomatic treatment. .Diabetes mellitus (12/14/2011) - SSI   Hypertension (12/14/2011) - borderline BP likely secondary to dehydration. Improved on  IVF.  NSL IVF.Follow. .Nausea, vomiting, diarrhea, abdominal pain-likely gastroenteritis -Resolved with supportive care.Gastric emptying study negative for  gastroparesis. Leukocytosis -Likely steroid effect from her recent steroid use as noted above.  LOS: 5 days   Victoria Bird 12/29/2011, 5:20 PM

## 2011-12-30 DIAGNOSIS — J441 Chronic obstructive pulmonary disease with (acute) exacerbation: Principal | ICD-10-CM

## 2011-12-30 DIAGNOSIS — J9600 Acute respiratory failure, unspecified whether with hypoxia or hypercapnia: Secondary | ICD-10-CM

## 2011-12-30 DIAGNOSIS — R110 Nausea: Secondary | ICD-10-CM

## 2011-12-30 LAB — GLUCOSE, CAPILLARY
Glucose-Capillary: 102 mg/dL — ABNORMAL HIGH (ref 70–99)
Glucose-Capillary: 177 mg/dL — ABNORMAL HIGH (ref 70–99)
Glucose-Capillary: 88 mg/dL (ref 70–99)

## 2011-12-30 MED ORDER — PANTOPRAZOLE SODIUM 40 MG PO TBEC
40.0000 mg | DELAYED_RELEASE_TABLET | Freq: Two times a day (BID) | ORAL | Status: DC
  Administered 2011-12-30 – 2012-01-01 (×4): 40 mg via ORAL
  Filled 2011-12-30 (×7): qty 1

## 2011-12-30 MED ORDER — BUDESONIDE 0.5 MG/2ML IN SUSP
0.5000 mg | Freq: Two times a day (BID) | RESPIRATORY_TRACT | Status: DC
  Administered 2011-12-30 – 2012-01-01 (×3): 0.5 mg via RESPIRATORY_TRACT
  Filled 2011-12-30 (×7): qty 2

## 2011-12-30 NOTE — Consult Note (Signed)
Reason for Consult: 19 yoF smoker readmitted with exerbation of COPD Referring Physician: [name] is an 65 y.o. female.  HPI: 66 yo F who smoked until recent hosp. 3/14. Admitted then for COPD exacerbation. Discharged on prednisone taper and completion of a course of Avelox. Couldn't afford Avelox. Had diffuse aching that she associates with prednisone withdrawal based on past experience. Hard, nonproductive coughing. No fever, blood or purulent discharge. Feels that she can't eat because stomach feels full. 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 She coughs occasonally w/ meals, but not often any sense of aspiration. No severe nocturnal choke/ strangle to suggest aspiration. Non sense of esophageal hangup or reflux. Came back to this hosp because of cough, malaise, inability to eat.   Past Medical History  Diagnosis Date  . Diabetes mellitus   . Hypertension   . COPD (chronic obstructive pulmonary disease)   . Bronchiectasis   . Chronic pain   . Anxiety     Past Surgical History  Procedure Date  . Appendectomy   . Cholecystectomy   . Abdominal hysterectomy   . Joint replacement   . Fracture surgery     Family History  Problem Relation Age of Onset  . Heart attack Mother     Social History:  reports that she has been smoking Cigarettes.  She has a 39 pack-year smoking history. She has never used smokeless tobacco. She reports that she does not drink alcohol or use illicit drugs.  Allergies:  Allergies  Allergen Reactions  . Aspartame And Phenylalanine Nausea And Vomiting    Patient says she is allergic to all artificial sweeteners  . Codeine   . Penicillins   . Prednisone   . Sulfa Antibiotics     Medications: reviewed  Results for orders placed during the hospital encounter of 12/24/11 (from the past 48 hour(s))  CBC     Status: Abnormal   Collection Time   12/29/11   4:10 AM      Component Value Range Comment   WBC 6.7  4.0 - 10.5 (K/uL)    RBC 4.16  3.87 - 5.11 (MIL/uL)    Hemoglobin 12.3  12.0 - 15.0 (g/dL)    HCT 96.0  45.4 - 09.8 (%)    MCV 90.6  78.0 - 100.0 (fL)    MCH 29.6  26.0 - 34.0 (pg)    MCHC 32.6  30.0 - 36.0 (g/dL)    RDW 11.9  14.7 - 82.9 (%)    Platelets 144 (*) 150 - 400 (K/uL)   BASIC METABOLIC PANEL     Status: Abnormal   Collection Time   12/29/11  4:10 AM      Component Value Range Comment   Sodium 137  135 - 145 (mEq/L)    Potassium 4.6  3.5 - 5.1 (mEq/L)    Chloride 106  96 - 112 (mEq/L)    CO2 27  19 - 32 (mEq/L)    Glucose, Bld 128 (*) 70 - 99 (mg/dL)    BUN 20  6 - 23 (mg/dL)    Creatinine, Ser 5.62  0.50 - 1.10 (mg/dL)    Calcium 8.4  8.4 - 10.5 (mg/dL)    GFR calc non Af Amer 62 (*) >90 (mL/min)    GFR calc Af Amer 72 (*) >90 (mL/min)   GLUCOSE, CAPILLARY     Status: Abnormal   Collection Time  12/29/11  7:15 AM      Component Value Range Comment   Glucose-Capillary 103 (*) 70 - 99 (mg/dL)    Comment 1 Notify RN      Comment 2 Documented in Chart     GLUCOSE, CAPILLARY     Status: Abnormal   Collection Time   12/29/11  5:09 PM      Component Value Range Comment   Glucose-Capillary 111 (*) 70 - 99 (mg/dL)   GLUCOSE, CAPILLARY     Status: Abnormal   Collection Time   12/29/11  9:43 PM      Component Value Range Comment   Glucose-Capillary 121 (*) 70 - 99 (mg/dL)   GLUCOSE, CAPILLARY     Status: Abnormal   Collection Time   12/30/11  7:36 AM      Component Value Range Comment   Glucose-Capillary 102 (*) 70 - 99 (mg/dL)   GLUCOSE, CAPILLARY     Status: Abnormal   Collection Time   12/30/11 11:35 AM      Component Value Range Comment   Glucose-Capillary 127 (*) 70 - 99 (mg/dL)     *RADIOLOGY REPORT- 12/24/11- images reviewed  Clinical Data: 66 year old female with shortness of breath and  cough.  CHEST - 2 VIEW  Comparison: 12/17/2011  Findings: The cardiomediastinal silhouette is unremarkable.    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  There is no evidence of focal airspace disease, pulmonary edema,  suspicious pulmonary nodule/mass, pleural effusion, or  pneumothorax.  No acute bony abnormalities are identified.  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  Original Report Authenticated By: Rosendo Gros, M.D.    ROS-see HPI Constitutional:   No-   weight loss, night sweats, fevers, chills, fatigue, lassitude + malaise HEENT:   +few headaches,  No-difficulty swallowing, tooth/dental problems, sore throat,       No-  sneezing, itching, ear ache, nasal congestion, post nasal drip,  CV:  No-  chest pain, orthopnea, PND, swelling in lower extremities, anasarca,  dizziness, palpitations Resp: +shortness of breath with exertion or at rest.              No-   productive cough,  + non-productive cough,  No- coughing up of blood.              No-   change in color of mucus.  + wheezing.   Skin: No-   rash or lesions. GI:    See HPI GU: No-  dysuria, change in color of urine, no urgency or frequency.  No- flank pain. MS:    Diffuse myalgias     No-  joint pain or swelling.  No- decreased range of motion.  No- back pain. Neuro-     nothing unusual Psych:  No- change in mood or affect. + depression or anxiety.  No memory loss.   PHYS EXAM: Blood pressure 122/71, pulse 52, temperature 97.8 F (36.6 C), temperature source Oral, resp. rate 18, height 5' 3.5" (1.613 m), weight 79.062 kg (174 lb 4.8 oz), SpO2 96.00%. O2 3L OBJ- Physical Exam General- Alert, Oriented, Affect-appropriate, Distress- none acute Skin- rash-none, lesions- none, excoriation- none Lymphadenopathy- none Head- atraumatic            Eyes- Gross vision intact, PERRLA, conjunctivae and secretions clear            Ears- Hearing normal            Nose- Clear, no-Septal dev, mucus, polyps, erosion,  Throat- mucosa clear Neck- flexible , trachea midline, no stridor , thyroid nl,  carotid no bruit Chest - symmetrical excursion , unlabored           Heart/CV- RRR , no murmur , no gallop  , no rub, nl s1 s2                           - JVD- none , edema- none, stasis changes- none, varices- none           Lung- bilateral wheeze, very harsh cough ,   dullness-none, rub- none           Chest wall-  Abd- tender-no, distended-no, bowel sounds-present, HSM- no Br/ Gen/ Rectal- Not done, not indicated Extrem- cyanosis- none, clubbing, none, atrophy- none, strength- nl Neuro- grossly intact to observation   Assessment/Plan: 1) COPD exacerbation-- Agree with staying off systemic steroids.  Rec- Nebulized pulmicort, then a maintenance steroid controller inhaler 2) Suspect GERD Rec- Suggest you look for reflux/ aspiration contributing to chtonic cough- barium swallow and speech therapy swallowing eval. 3) Debilitation Rec- Early PT/OT and consider if rehab candidate. Could she be qualified for funding assistance for Pulmonary Rehab?    , D 12/30/2011, 2:03 PM

## 2011-12-30 NOTE — Progress Notes (Signed)
Subjective: Still with cough with NO SIGNIFICANT IMPROVEMENT. PATIENT STATES DOESNOT FEEL TOO WELL AT THIS TIME. Objective: Vital signs in last 24 hours: Temp:  [97.5 F (36.4 C)-98.2 F (36.8 C)] 98 F (36.7 C) (03/30 0540) Pulse Rate:  [54-63] 59  (03/30 0540) Resp:  [16-18] 18  (03/30 0540) BP: (105-124)/(45-73) 105/66 mmHg (03/30 0540) SpO2:  [95 %-99 %] 96 % (03/30 0815) Last BM Date: 12/28/11 Intake/Output from previous day: 03/29 0701 - 03/30 0700 In: 1456.3 [P.O.:600; I.V.:856.3] Out: 2500 [Urine:2500] Intake/Output this shift:      General Appearance:    Alert, cooperative, coughing continuously, appears stated age  Lungs:     decreased air entry bilaterally, no wheezes. respirations unlabored. Coarse DIFFUSE BS/RHONCHI   Heart:    Regular rate and rhythm, S1 and S2 normal, no murmur, rub   or gallop  Abdomen:     Soft, non-tender, bowel sounds active all four quadrants,    no masses, no organomegaly  Extremities:   Extremities normal, atraumatic, no cyanosis or edema  Neurologic:   CNII-XII intact, normal strength, sensation and reflexes    throughout    Weight change:   Intake/Output Summary (Last 24 hours) at 12/30/11 1110 Last data filed at 12/30/11 0500  Gross per 24 hour  Intake 1216.25 ml  Output   1700 ml  Net -483.75 ml    Lab Results:   Basename 12/29/11 0410 12/28/11 0413  NA 137 136  K 4.6 4.5  CL 106 105  CO2 27 26  GLUCOSE 128* 100*  BUN 20 23  CREATININE 0.95 0.92  CALCIUM 8.4 8.3*    Basename 12/29/11 0410 12/28/11 0413  WBC 6.7 8.3  HGB 12.3 13.2  HCT 37.7 41.2  PLT 144* 140*  MCV 90.6 90.9   PT/INR No results found for this basename: LABPROT:2,INR:2 in the last 72 hours ABG No results found for this basename: PHART:2,PCO2:2,PO2:2,HCO3:2 in the last 72 hours  Micro Results: No results found for this or any previous visit (from the past 240 hour(s)). Studies/Results: No results found. Medications: Scheduled Meds:     . antiseptic oral rinse  15 mL Mouth Rinse BID  . atorvastatin  20 mg Oral q1800  . benzonatate  200 mg Oral TID  . budesonide-formoterol  2 puff Inhalation BID  . chlorpheniramine-HYDROcodone  5 mL Oral Q12H  . guaiFENesin  1,200 mg Oral BID  . heparin  5,000 Units Subcutaneous Q8H  . insulin aspart  0-9 Units Subcutaneous TID WC  . moxifloxacin  400 mg Oral q1800  . pantoprazole  40 mg Oral Q1200  . tiotropium  18 mcg Inhalation Daily   Continuous Infusions:    . DISCONTD: sodium chloride 125 mL/hr at 12/29/11 1228   PRN Meds:.acetaminophen, acetaminophen, albuterol, albuterol, albuterol, ondansetron (ZOFRAN) IV, ondansetron, oxyCODONE-acetaminophen Assessment/Plan: Patient Active Hospital Problem List: .COPD exacerbation (chronic obstructive pulmonary disease) -Will continue nebs, antibiotics Mucinex and Tessalon.  - Tussionex  Added , given her cough paroxysms. Continue PPI. Follow -Holding off prednisone as patient reported intolerance -she had been discharged on it prior to this admission. PATIENT STILL WITH RHONCHORUS PRODUCTIVE COUGH DESPITE ANTIBIOTICS AND COUGH REGIMEN. WILL CONSULT WITH PULMONARY FOR FURTHER EVAL AND RXCS. Nausea Associated with meals and feeling full. Improving. Patient with hx of DM. Gastric emptying study negative. Continue PPI. Symptomatic treatment. .Diabetes mellitus (12/14/2011) - SSI   Hypertension (12/14/2011) - borderline BP likely secondary to dehydration. Improved on  IVF.  NSL IVF.Follow. .Nausea, vomiting, diarrhea, abdominal  pain-likely gastroenteritis -Resolved with supportive care.Gastric emptying study negative for  gastroparesis. Leukocytosis -Likely steroid effect from her recent steroid use as noted above.  LOS: 6 days   Xin Klawitter 12/30/2011, 11:10 AM

## 2011-12-31 LAB — BASIC METABOLIC PANEL
CO2: 30 mEq/L (ref 19–32)
Calcium: 8.8 mg/dL (ref 8.4–10.5)
Creatinine, Ser: 1 mg/dL (ref 0.50–1.10)
GFR calc non Af Amer: 58 mL/min — ABNORMAL LOW (ref >90)
Sodium: 138 mEq/L (ref 135–145)

## 2011-12-31 LAB — CBC
MCH: 29.3 pg (ref 26.0–34.0)
MCHC: 32.9 g/dL (ref 30.0–36.0)
MCV: 89.2 fL (ref 78.0–100.0)
Platelets: 162 10*3/uL (ref 150–400)
RDW: 13.1 % (ref 11.5–15.5)

## 2011-12-31 LAB — GLUCOSE, CAPILLARY: Glucose-Capillary: 115 mg/dL — ABNORMAL HIGH (ref 70–99)

## 2011-12-31 NOTE — Progress Notes (Signed)
PCCM progress note  Subjective: HPI: 66 yo F who smoked until recent hosp. 3/14. Admitted then for COPD exacerbation. Discharged on prednisone taper and completion of a course of Avelox. Couldn't afford Avelox. Had diffuse aching that she associates with prednisone withdrawal based on past experience. Hard, nonproductive coughing. No fever, blood or purulent discharge. Feels that she can't eat because stomach feels full. 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 She coughs occasonally w/ meals, but not often any sense of aspiration. No severe nocturnal choke/ strangle to suggest aspiration. Non sense of esophageal hangup or reflux. Came back to this hosp because of cough, malaise, inability to eat.  Subjective / Today-  Slept poorly, C/O "muscles twitch" possibly related to myalgias she noted with steroid withdrawal. Cough may be a little better.   Objective: Vital signs in last 24 hours: Temp:  [97.8 F (36.6 C)-98.4 F (36.9 C)] 98.4 F (36.9 C) (03/31 0415) Pulse Rate:  [52-72] 72  (03/31 0415) Resp:  [18-19] 18  (03/31 0415) BP: (115-137)/(66-72) 115/66 mmHg (03/31 0415) SpO2:  [94 %-98 %] 98 % (03/31 0832)  PHYS EXAM:  General- Alert, Oriented, Affect-appropriate, Distress- none acute  Skin- rash-none, lesions- none, excoriation- none  Lymphadenopathy- none  Head- atraumatic  Eyes- Gross vision intact, PERRLA, conjunctivae and secretions clear  Ears- Hearing normal  Nose- Clear,  Throat- mucosa clear  Neck- flexible , trachea midline, no stridor , thyroid nl, carotid no bruit  Chest - symmetrical excursion , unlabored  Heart/CV- RRR , no murmur , no gallop , no rub, nl s1 s2  - JVD- none , edema- none, stasis changes- none, varices- none  Lung- less wheeze, still harsh cough ,  Chest wall-  Abd- tender-no,, bowel sounds-present/ faint, HSM- no. Old RUQ scar Br/ Gen/ Rectal- Not done, not indicated    Extrem- cyanosis- none, clubbing, none, atrophy- none, strength- nl  Neuro- grossly intact to observation. No visible tremor   Lab Results:  Basename 12/31/11 0406 12/29/11 0410  WBC 6.6 6.7  HGB 13.0 12.3  HCT 39.5 37.7  PLT 162 144*   BMET  Basename 12/31/11 0406 12/29/11 0410  NA 138 137  K 4.4 4.6  CL 104 106  CO2 30 27  GLUCOSE 110* 128*  BUN 19 20  CREATININE 1.00 0.95  CALCIUM 8.8 8.4    Studies/Results: No results found.  Medications: I have reviewed the patient's current medications.  Assessment/Plan: Assessment/Plan:  1) COPD exacerbation-- Agree with staying off systemic steroids. Tracheobronchitis. Rec- Nebulized pulmicort, then a maintenance steroid controller inhaler  2) Suspect GERD. She doesn't recognize it , but with tracheitis/ cough, need to be suspicious. Rec- Suggest you look for reflux/ aspiration contributing to chronic cough- barium swallow and speech therapy swallowing eval.  3) Debilitation  Rec- Early PT/OT and consider if rehab candidate. Could she be qualified for funding assistance for Pulmonary Rehab? May be able to address c/o muscle jumpiness with low dose BZO.   LOS: 7 days   Curtis Cain D 12/31/2011, 1:01 PM

## 2011-12-31 NOTE — Progress Notes (Signed)
Subjective: Patient states cough not as frequent. Objective: Vital signs in last 24 hours: Temp:  [98.2 F (36.8 C)-98.4 F (36.9 C)] 98.4 F (36.9 C) (03/31 0415) Pulse Rate:  [65-72] 72  (03/31 0415) Resp:  [18-19] 18  (03/31 0415) BP: (115-137)/(66-72) 115/66 mmHg (03/31 0415) SpO2:  [94 %-98 %] 98 % (03/31 0832) Last BM Date: 12/28/11 Intake/Output from previous day: 03/30 0701 - 03/31 0700 In: 1040 [P.O.:1040] Out: 2550 [Urine:2550] Intake/Output this shift: Total I/O In: 120 [P.O.:120] Out: 800 [Urine:800]    General Appearance:    Alert, cooperative, coughing continuously, appears stated age  Lungs:     decreased air entry bilaterally, no wheezes. respirations unlabored. Coarse BS   Heart:    Regular rate and rhythm, S1 and S2 normal, no murmur, rub   or gallop  Abdomen:     Soft, non-tender, bowel sounds active all four quadrants,    no masses, no organomegaly  Extremities:   Extremities normal, atraumatic, no cyanosis or edema  Neurologic:   CNII-XII intact, normal strength, sensation and reflexes    throughout    Weight change:   Intake/Output Summary (Last 24 hours) at 12/31/11 1434 Last data filed at 12/31/11 0930  Gross per 24 hour  Intake    800 ml  Output   2450 ml  Net  -1650 ml    Lab Results:   Basename 12/31/11 0406 12/29/11 0410  NA 138 137  K 4.4 4.6  CL 104 106  CO2 30 27  GLUCOSE 110* 128*  BUN 19 20  CREATININE 1.00 0.95  CALCIUM 8.8 8.4    Basename 12/31/11 0406 12/29/11 0410  WBC 6.6 6.7  HGB 13.0 12.3  HCT 39.5 37.7  PLT 162 144*  MCV 89.2 90.6   PT/INR No results found for this basename: LABPROT:2,INR:2 in the last 72 hours ABG No results found for this basename: PHART:2,PCO2:2,PO2:2,HCO3:2 in the last 72 hours  Micro Results: No results found for this or any previous visit (from the past 240 hour(s)). Studies/Results: No results found. Medications: Scheduled Meds:    . antiseptic oral rinse  15 mL Mouth Rinse  BID  . atorvastatin  20 mg Oral q1800  . benzonatate  200 mg Oral TID  . budesonide  0.5 mg Nebulization BID  . budesonide-formoterol  2 puff Inhalation BID  . chlorpheniramine-HYDROcodone  5 mL Oral Q12H  . guaiFENesin  1,200 mg Oral BID  . heparin  5,000 Units Subcutaneous Q8H  . insulin aspart  0-9 Units Subcutaneous TID WC  . moxifloxacin  400 mg Oral q1800  . pantoprazole  40 mg Oral BID AC  . tiotropium  18 mcg Inhalation Daily  . DISCONTD: pantoprazole  40 mg Oral Q1200   Continuous Infusions:   PRN Meds:.acetaminophen, acetaminophen, albuterol, albuterol, albuterol, ondansetron (ZOFRAN) IV, ondansetron, oxyCODONE-acetaminophen Assessment/Plan: Patient Active Hospital Problem List: .COPD exacerbation (chronic obstructive pulmonary disease) -Will continue nebs, antibiotics Mucinex and Tessalon.  - Tussionex  Added , given her cough paroxysms. Continue PPI. Follow -Holding off prednisone as patient reported intolerance -she had been discharged on it prior to this admission. Patient started on pulmicort nebs and PPI increased to BID per pulm rxcs. ST swallow eval pending. Appreciate pulm input  And rxcs. Nausea Associated with meals and feeling full. Improving. Patient with hx of DM. Gastric emptying study negative. Continue PPI. Symptomatic treatment. .Diabetes mellitus (12/14/2011) - SSI   Hypertension (12/14/2011) - borderline BP likely secondary to dehydration. Improved on  IVF.  NSL IVF.Follow. .Nausea, vomiting, diarrhea, abdominal pain-likely gastroenteritis -Resolved with supportive care.Gastric emptying study negative for  gastroparesis. Leukocytosis -Likely steroid effect from her recent steroid use as noted above.  LOS: 7 days   [facility] 12/31/2011, 2:34 PM

## 2012-01-01 ENCOUNTER — Encounter (HOSPITAL_COMMUNITY): Payer: Self-pay | Admitting: Internal Medicine

## 2012-01-01 DIAGNOSIS — J441 Chronic obstructive pulmonary disease with (acute) exacerbation: Secondary | ICD-10-CM

## 2012-01-01 DIAGNOSIS — R05 Cough: Secondary | ICD-10-CM

## 2012-01-01 DIAGNOSIS — K219 Gastro-esophageal reflux disease without esophagitis: Secondary | ICD-10-CM

## 2012-01-01 DIAGNOSIS — J40 Bronchitis, not specified as acute or chronic: Secondary | ICD-10-CM | POA: Diagnosis present

## 2012-01-01 HISTORY — DX: Bronchitis, not specified as acute or chronic: J40

## 2012-01-01 LAB — BASIC METABOLIC PANEL
BUN: 18 mg/dL (ref 6–23)
CO2: 32 mEq/L (ref 19–32)
Calcium: 9.1 mg/dL (ref 8.4–10.5)
Glucose, Bld: 87 mg/dL (ref 70–99)
Sodium: 139 mEq/L (ref 135–145)

## 2012-01-01 LAB — GLUCOSE, CAPILLARY
Glucose-Capillary: 102 mg/dL — ABNORMAL HIGH (ref 70–99)
Glucose-Capillary: 110 mg/dL — ABNORMAL HIGH (ref 70–99)
Glucose-Capillary: 92 mg/dL (ref 70–99)

## 2012-01-01 MED ORDER — HYDROCOD POLST-CHLORPHEN POLST 10-8 MG/5ML PO LQCR: 5.0000 mL | Freq: Two times a day (BID) | ORAL | Status: DC

## 2012-01-01 MED ORDER — GUAIFENESIN ER 600 MG PO TB12: 1200.0000 mg | ORAL_TABLET | Freq: Two times a day (BID) | ORAL | Status: AC

## 2012-01-01 MED ORDER — OXYCODONE-ACETAMINOPHEN 5-325 MG PO TABS: 1.0000 | ORAL_TABLET | ORAL | Status: AC | PRN

## 2012-01-01 MED ORDER — BUDESONIDE-FORMOTEROL FUMARATE 160-4.5 MCG/ACT IN AERO: 2.0000 | INHALATION_SPRAY | Freq: Two times a day (BID) | RESPIRATORY_TRACT | Status: DC

## 2012-01-01 MED ORDER — BENZONATATE 200 MG PO CAPS: 200.0000 mg | ORAL_CAPSULE | Freq: Three times a day (TID) | ORAL | Status: AC

## 2012-01-01 MED ORDER — TIOTROPIUM BROMIDE MONOHYDRATE 18 MCG IN CAPS: 18.0000 ug | ORAL_CAPSULE | Freq: Every day | RESPIRATORY_TRACT | Status: DC

## 2012-01-01 MED ORDER — PANTOPRAZOLE SODIUM 40 MG PO TBEC: 40.0000 mg | DELAYED_RELEASE_TABLET | Freq: Two times a day (BID) | ORAL | Status: DC

## 2012-01-01 NOTE — Progress Notes (Signed)
Physical Therapy Treatment Patient Details Name: Victoria Bird MRN: [id number] DOB: 08-Oct-1945 Today's Date: 01/01/2012 1135-1151PT Assessment/Plan  PT - Assessment/Plan Comments on Treatment Session: pt tolerated inspite of stating that she was in pain, gait is unsteady, pt holds to objects, rail, bed, is up ad lib in room. PT Plan: Discharge plan remains appropriate PT Frequency: Min 3X/week Follow Up Recommendations: Home health PT Equipment Recommended: None recommended by PT;Other (comment) PT Goals  Acute Rehab PT Goals Pt will Ambulate: >150 feet;with modified independence;with least restrictive assistive device PT Goal: Ambulate - Progress: Progressing toward goal  PT Treatment Precautions/Restrictions  Precautions Precautions: Other (comment) (O2) Precaution Comments: Hx COPD currently on 2 lts O2 Required Braces or Orthoses: No Restrictions Weight Bearing Restrictions: No Mobility (including Balance) Bed Mobility Supine to Sit: 6: Modified independent (Device/Increase time) Sit to Supine: 6: Modified independent (Device/Increase time) Transfers Sit to Stand: 6: Modified independent (Device/Increase time);From bed;From chair/3-in-1 Stand to Sit: To bed Ambulation/Gait Ambulation/Gait: Yes Ambulation/Gait Assistance: 4: Min assist Ambulation/Gait Assistance Details (indicate cue type and reason): min guard for staedyiness while amb in hall.cane did not provide the neccessary support. Ambulation Distance (Feet): 200 Feet Assistive device: Straight cane Gait Pattern: Step-through pattern Gait velocity: WFL    Exercise    End of Session PT - End of Session Activity Tolerance: Patient limited by fatigue Patient left: in bed;with call bell in reach General Behavior During Session: [facility] Hights for tasks performed  Rada Hay 01/01/2012, 12:45 PM

## 2012-01-01 NOTE — Progress Notes (Signed)
   CARE MANAGEMENT NOTE 01/01/2012  Patient:  Victoria Bird, Victoria Bird   Account Number:  [id number]  Date Initiated:  12/25/2011  Documentation initiated by:  PEARSON,COOKIE  Subjective/Objective Assessment:   pt admitted with cco N,V,D     Action/Plan:   from home   Anticipated DC Date:  01/02/2012   Anticipated DC Plan:  HOME W HOME HEALTH SERVICES         Choice offered to / List presented to:             Status of service:  In process, will continue to follow Medicare Important Message given?  NO (If response is "NO", the following Medicare IM given date fields will be blank) Date Medicare IM given:   Date Additional Medicare IM given:    Discharge Disposition:  HOME W HOME HEALTH SERVICES  Per UR Regulation:  Reviewed for med. necessity/level of care/duration of stay  If discussed at Long Length of Stay Meetings, dates discussed:    Comments:  01/01/12 Chloee Tena RN,BSN NCM [phone] PT-HH.ST-THIN LIQ,ORAL CARE BID.  12/25/11 MPearson, RN, BSN Pt plan to dc home with no needs.mp

## 2012-01-01 NOTE — Progress Notes (Signed)
Length of Stay =  8   Subjective: HPI: 66 yo F who smoked until recent hosp. 3/14. Admitted then for COPD exacerbation. Discharged on prednisone taper and completion of a course of Avelox. Couldn't afford Avelox. Had diffuse aching that she associates with prednisone withdrawal based on past experience. Hard, nonproductive coughing. No fever, blood or purulent discharge. Feels that she can't eat because stomach feels full. 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 She coughs occasonally w/ meals, but not often any sense of aspiration. No severe nocturnal choke/ strangle to suggest aspiration. Non sense of esophageal hangup or reflux. Readmit 3/25  because of cough, malaise, inability to eat.  Subjective     Sitting up in bed , +cough and muscle pain    Objective: Vital signs in last 24 hours: Temp:  [97.9 F (36.6 C)-98.1 F (36.7 C)] 98.1 F (36.7 C) (04/01 0420) Pulse Rate:  [53-65] 62  (04/01 0420) Resp:  [18] 18  (04/01 0420) BP: (104-120)/(61-75) 104/61 mmHg (04/01 0420) SpO2:  [90 %-97 %] 97 % (04/01 0756) 2lpm  PHYS EXAM:   GEN: A/Ox3; pleasant , NAD  HEENT:  [place]/AT,  EACs-clear, TMs-wnl, NOSE-clear, THROAT-clear, no lesions, no postnasal drip or exudate noted.   NECK:  Supple w/ fair ROM; no JVD; normal carotid impulses w/o bruits; no thyromegaly or nodules palpated; no lymphadenopathy.  RESP  Coarse BS w/ no wheezing  no accessory muscle use, no dullness to percussion  CARD:  RRR, no m/r/g  , no peripheral edema, pulses intact, no cyanosis or clubbing.  GI:   Soft & nt; nml bowel sounds; no organomegaly or masses detected.  Musco: Warm bil, no deformities or joint swelling noted.   Neuro: alert, no focal deficits noted.    Skin: Warm, no lesions or rashes   Lab Results:  Basename 12/31/11 0406  WBC 6.6  HGB 13.0  HCT 39.5  PLT 162   BMET  Basename 01/01/12 0400 12/31/11 0406  NA 139 138    K 5.1 4.4  CL 102 104  CO2 32 30  GLUCOSE 87 110*  BUN 18 19  CREATININE 1.04 1.00  CALCIUM 9.1 8.8      s.  Assessment/Plan: Assessment/Plan:  1) COPD exacerbation-- Agree with staying off systemic steroids. Tracheobronchitis. Rec- cont on pulmonary regimen. ? Need for both pulmicort and symbicort     2) Suspect GERD. She doesn't recognize it , but with tracheitis/ cough max rx then regroup in office in a couple of weeks (ppi bid and h2hs)   Cont PPI  3) Debilitation  ?  Pulmonary Rehab?     LOS: 8 days   PARRETT,TAMMY NP -C  01/01/2012, 12:16 PM  Pt independently  seen and examined and available cxr's reviewed and I agree with above findings/ imp/ plan   Sandrea Hughs, MD Pulmonary and Critical Care Medicine [facility] [phone]

## 2012-01-01 NOTE — Progress Notes (Signed)
   CARE MANAGEMENT NOTE 01/01/2012  Patient:  Victoria Bird, Victoria Bird   Account Number:  [id number]  Date Initiated:  12/25/2011  Documentation initiated by:  PEARSON,COOKIE  Subjective/Objective Assessment:   pt admitted with cco N,V,D     Action/Plan:   from home   Anticipated DC Date:  01/02/2012   Anticipated DC Plan:  HOME W HOME HEALTH SERVICES         Choice offered to / List presented to:  C-1 Patient        HH arranged  HH-1 RN  HH-2 PT      [facility] agency  [facility]   Status of service:  Completed, signed off Medicare Important Message given?  NO (If response is "NO", the following Medicare IM given date fields will be blank) Date Medicare IM given:   Date Additional Medicare IM given:    Discharge Disposition:  HOME W HOME HEALTH SERVICES  Per UR Regulation:  Reviewed for med. necessity/level of care/duration of stay  If discussed at Long Length of Stay Meetings, dates discussed:    Comments:  01/01/12 Alycia Cooperwood RN,BSN NCM [phone] [facility] HHRN/PT CONTACTED WILL FAX D/C SUMMARY,& ORDERS TO [phone].PCP APPT SET. PT-HH.ST-THIN LIQ,ORAL CARE BID.  12/25/11 MPearson, RN, BSN Pt plan to dc home with no needs.mp
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
## 2012-01-01 NOTE — Discharge Summary (Signed)
Discharge Summary  Victoria Bird MR#: [id number]  DOB:05/01/1946  Date of Admission: 12/24/2011 Date of Discharge: 01/01/2012  Patient's PCP: Nada Boozer  Attending Physician:Miia Blanks  Consults:   #1 pulmonary: Dr. Maple Hudson  Discharge Diagnoses: COPD (chronic obstructive pulmonary disease)/Tracheobronchitis Present on Admission:  .Dehydration .COPD with acute exacerbation .COPD (chronic obstructive pulmonary disease) .Diabetes mellitus .Hypertension .Leukocytosis .Nausea .Tracheobronchitis   Brief Admitting History and Physical 65yoF with h/o HTN, DM, COPD presents as readmission for recent COPD exacerbation with  inability to fill meds, nausea, vomiting, diarrhea, abd pain.  Pt was last admitted to [facility] on 3/14 with COPD exacerbation, leukocytosis. Started  avelox, IV steroids. CXR did not show any infiltrate. She was discharged 3/18 on steroid  taper and 3 more doses of PO avelox. Now  She went home and took Prednisone for another day, however stopped taking it thereafter because  of "whole body pain" in her "muscles and bones." She also developed Tuesday nausea and  vomiting, and some diarrhea, with associated abdominal pain. However, she also relates her  abdominal pain may be due to coughing so hard, as she notes that she'll eat something and it  will cause her to cough so hard that her abdomen and ribs will start hurting. The diarrhea  persisted through the week but has improved through today. She feels warm and notes an episode  of sweating at night but hasn't measured temp. Overall, her dyspnea doesn't seem to be as bad  and is not as big a concern to her, but the cough is severe and her main complaint. No chest  pain. No sick contacts. She has not smoked since leaving the hospital last.  She presented to the ED because she went to pharmacy to have the avelox filled and wasn't able  to due to Medicaid issues, and tried for a couple days but not able. She  got a call from a  nurse in the hospital who referred her to the ED. In the ED labs significant for hypoNa 134,  negative cardiac enzymes. WBC 13.6 up from 10.3 previously. UA negative for infection. CXR with  COPD/emphysema but nothing acute.  ROS as above. No dysuria. Otherwise negative.  For the rest of history and physical see H and P dictated by Dr Kaylyn Layer   Discharge Medications Medication List  As of 01/01/2012  4:33 PM   START taking these medications         chlorpheniramine-HYDROcodone 10-8 MG/5ML Lqcr   Commonly known as: TUSSIONEX   Take 5 mLs by mouth every 12 (twelve) hours. Use for 3 days then as needed.      guaiFENesin 600 MG 12 hr tablet   Commonly known as: MUCINEX   Take 2 tablets (1,200 mg total) by mouth 2 (two) times daily. Use for 3 days  Then use as needed.      oxyCODONE-acetaminophen 5-325 MG per tablet   Commonly known as: PERCOCET   Take 1-2 tablets by mouth every 4 (four) hours as needed.      pantoprazole 40 MG tablet   Commonly known as: PROTONIX   Take 1 tablet (40 mg total) by mouth 2 (two) times daily before a meal.         CHANGE how you take these medications         benzonatate 200 MG capsule   Commonly known as: TESSALON   Take 1 capsule (200 mg total) by mouth 3 (three) times daily. Take for 3 days then use  as needed.   What changed: - medication strength - dose - how often to take the med - reasons to take the med - doctor's instructions         CONTINUE taking these medications         albuterol 108 (90 BASE) MCG/ACT inhaler   Commonly known as: PROVENTIL HFA;VENTOLIN HFA   Inhale 2 puffs into the lungs every 6 (six) hours as needed. For shortness of breath.      ALPRAZolam 0.5 MG tablet   Commonly known as: XANAX      budesonide-formoterol 160-4.5 MCG/ACT inhaler   Commonly known as: SYMBICORT   Inhale 2 puffs into the lungs 2 (two) times daily.      metFORMIN 500 MG tablet   Commonly known as: GLUCOPHAGE       rosuvastatin 10 MG tablet   Commonly known as: CRESTOR      tiotropium 18 MCG inhalation capsule   Commonly known as: SPIRIVA   Place 1 capsule (18 mcg total) into inhaler and inhale daily.         STOP taking these medications         lisinopril-hydrochlorothiazide 20-25 MG per tablet      predniSONE 5 MG tablet          Where to get your medications    These are the prescriptions that you need to pick up.   You may get these medications from any pharmacy.         benzonatate 200 MG capsule   budesonide-formoterol 160-4.5 MCG/ACT inhaler   chlorpheniramine-HYDROcodone 10-8 MG/5ML Lqcr   guaiFENesin 600 MG 12 hr tablet   oxyCODONE-acetaminophen 5-325 MG per tablet   pantoprazole 40 MG tablet   tiotropium 18 MCG inhalation capsule           Hospital Course: COPD (chronic obstructive pulmonary disease) Patient was admitted with acute COPD exacerbation after being recently been discharged. Patient had an intolerance to the prednisone and a such on admission was not placed back on prednisone. Patient was placed on Avelox antibiotics, nebulizer treatments, Symbicort, oxygen and antitussives medications. Patient continued to experience unrelenting cough and a such Tessalon Perles was also added to her cough regimen. Patient was also placed on a PPI. Patient was monitored patient improved slowly did have some occasions of nausea which are related to food. Patient continued to complain of a cough whereby she had coughs so much her body hurt. A gastric emptying study was done to rule out gastroparesis which was negative. Patient was monitored and followed and do too her continued cough a pulmonary consultation was obtained. Patient was seen in consultation by Dr. Maple Hudson of [facility] who  felt patient likely had a component of reflux associated with this. It was recommended to place patient on nebulizer Pulmicort as then a maintenance steroid controlled inhaler. A speech therapy  evaluation was obtained and patient was seen in evaluation by speech therapy who felt that patient's symptoms were likely related to GERD. Patient was pulled on reflux precautions as well as aspiration precautions. Patient was placed on twice daily proton pump inhibitor. Patient cough improved somewhat however continued to still have a chronic cough. Patient remained afebrile throughout the hospitalization. Patient was also seen by physical therapy. Patient will be discharged home in stable condition on cough medication, Symbicort twice daily, Spiriva, proton pump inhibitor. Pulmonary rehabilitation will call patient with appointment for outpatient followup. Patient has been adequately treated with antibiotics and a such  requires no further antibiotic treatment. Patient be discharged home in stable and improved condition will need to followup with her PCP one week post discharge.  #2 hypertension On admission patient was noted to be on antihypertensive medications. However during the hospitalization patient's blood pressure remained in the low 100s to 90s patient had to be placed on IV fluids for hydration and her antihypertensive medications were discontinued. Patient will be discharged home off of her antihypertensive medications and will followup with PCP one week post discharge at which point in time it will be determined per PCP as to whether to resume patient's antihypertensive medications.  The rest of patient's chronic medical issues remained stable throughout the hospitalization the patient be discharged in stable and improved condition.  Present on Admission:  .Dehydration .COPD with acute exacerbation .COPD (chronic obstructive pulmonary disease) .Diabetes mellitus .Hypertension .Leukocytosis .Nausea .Tracheobronchitis   Day of Discharge BP 101/63  Pulse 60  Temp(Src) 98.8 F (37.1 C) (Oral)  Resp 18  Ht 5' 3.5" (1.613 m)  Wt 79.062 kg (174 lb 4.8 oz)  BMI 30.39 kg/m2  SpO2  96% Subjective: Patient states feels okay however still has a cough. Patient is complaining of diffuse pain all over. General: Alert, awake, oriented x3, in no acute distress. Heart: Regular rate and rhythm, without murmurs, rubs, gallops. Lungs: Clear to auscultation bilaterally. Abdomen: Soft, nontender, nondistended, positive bowel sounds. Extremities: No clubbing cyanosis or edema with positive pedal pulses.   Results for orders placed during the hospital encounter of 12/24/11 (from the past 48 hour(s))  GLUCOSE, CAPILLARY     Status: Abnormal   Collection Time   12/30/11  4:59 PM      Component Value Range Comment   Glucose-Capillary 177 (*) 70 - 99 (mg/dL)   GLUCOSE, CAPILLARY     Status: Normal   Collection Time   12/30/11  8:45 PM      Component Value Range Comment   Glucose-Capillary 88  70 - 99 (mg/dL)    Comment 1 Notify RN     CBC     Status: Normal   Collection Time   12/31/11  4:06 AM      Component Value Range Comment   WBC 6.6  4.0 - 10.5 (K/uL)    RBC 4.43  3.87 - 5.11 (MIL/uL)    Hemoglobin 13.0  12.0 - 15.0 (g/dL)    HCT 98.1  19.1 - 47.8 (%)    MCV 89.2  78.0 - 100.0 (fL)    MCH 29.3  26.0 - 34.0 (pg)    MCHC 32.9  30.0 - 36.0 (g/dL)    RDW 29.5  62.1 - 30.8 (%)    Platelets 162  150 - 400 (K/uL)   BASIC METABOLIC PANEL     Status: Abnormal   Collection Time   12/31/11  4:06 AM      Component Value Range Comment   Sodium 138  135 - 145 (mEq/L)    Potassium 4.4  3.5 - 5.1 (mEq/L)    Chloride 104  96 - 112 (mEq/L)    CO2 30  19 - 32 (mEq/L)    Glucose, Bld 110 (*) 70 - 99 (mg/dL)    BUN 19  6 - 23 (mg/dL)    Creatinine, Ser 6.57  0.50 - 1.10 (mg/dL)    Calcium 8.8  8.4 - 10.5 (mg/dL)    GFR calc non Af Amer 58 (*) >90 (mL/min)    GFR calc Af Amer 67 (*) >90 (  mL/min)   GLUCOSE, CAPILLARY     Status: Abnormal   Collection Time   12/31/11 11:51 AM      Component Value Range Comment   Glucose-Capillary 102 (*) 70 - 99 (mg/dL)   GLUCOSE, CAPILLARY      Status: Abnormal   Collection Time   12/31/11  5:11 PM      Component Value Range Comment   Glucose-Capillary 128 (*) 70 - 99 (mg/dL)   GLUCOSE, CAPILLARY     Status: Abnormal   Collection Time   12/31/11  9:01 PM      Component Value Range Comment   Glucose-Capillary 115 (*) 70 - 99 (mg/dL)    Comment 1 Notify RN     BASIC METABOLIC PANEL     Status: Abnormal   Collection Time   01/01/12  4:00 AM      Component Value Range Comment   Sodium 139  135 - 145 (mEq/L)    Potassium 5.1  3.5 - 5.1 (mEq/L)    Chloride 102  96 - 112 (mEq/L)    CO2 32  19 - 32 (mEq/L)    Glucose, Bld 87  70 - 99 (mg/dL)    BUN 18  6 - 23 (mg/dL)    Creatinine, Ser 4.09  0.50 - 1.10 (mg/dL)    Calcium 9.1  8.4 - 10.5 (mg/dL)    GFR calc non Af Amer 56 (*) >90 (mL/min)    GFR calc Af Amer 64 (*) >90 (mL/min)   GLUCOSE, CAPILLARY     Status: Abnormal   Collection Time   01/01/12  7:17 AM      Component Value Range Comment   Glucose-Capillary 102 (*) 70 - 99 (mg/dL)   GLUCOSE, CAPILLARY     Status: Abnormal   Collection Time   01/01/12 11:54 AM      Component Value Range Comment   Glucose-Capillary 110 (*) 70 - 99 (mg/dL)   GLUCOSE, CAPILLARY     Status: Normal   Collection Time   01/01/12  3:57 PM      Component Value Range Comment   Glucose-Capillary 92  70 - 99 (mg/dL)     Dg Chest 2 View  05/12/9146  *RADIOLOGY REPORT*  Clinical Data: 66 year old female with shortness of breath and cough.  CHEST - 2 VIEW  Comparison: 12/17/2011  Findings: The cardiomediastinal silhouette is unremarkable. 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 There is no evidence of focal airspace disease, pulmonary edema, suspicious pulmonary nodule/mass, pleural effusion, or pneumothorax. No acute bony abnormalities are identified.  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  Original Report Authenticated By: Rosendo Gros, M.D.   Dg Chest 2 View  12/17/2011  *RADIOLOGY REPORT*  Clinical Data: Shortness of  breath.  Back and chest pain.  History of COPD.  CHEST - 2 VIEW  Comparison: 12/14/2011  Findings: There is slight chronic accentuation of the interstitial markings.  The heart size and vascularity are normal.  No infiltrates or effusions.  No acute osseous abnormalities.  Diffuse osteopenia.  IMPRESSION: No acute abnormality.  Chronic accentuation of the interstitial markings.  Markings are less prominent and vascularity is less prominent than on the prior exam.  Original Report Authenticated By: Gwynn Burly, M.D.   Dg Chest 2 View  12/14/2011  *RADIOLOGY REPORT*  Clinical Data: Cough, shortness of breath.  CHEST - 2 VIEW  Comparison: None.  Findings: There is hyperinflation of the lungs compatible with COPD.  Peribronchial thickening.  Heart is  normal size.  No confluent opacities or effusions.  No acute bony abnormality.  IMPRESSION: COPD/chronic bronchitic changes.  Original Report Authenticated By: Cyndie Chime, M.D.   Nm Gastric Emptying  12/28/2011  *RADIOLOGY REPORT*  Clinical data: Nausea, vomiting, choking with eating, history of diabetes, hiatal hernia  NUCLEAR MEDICINE GASTRIC EMPTYING EXAM:  Radiopharmaceutical:  221 mCi Tc-76m sulfur colloid labeled egg whites  Technique: The patient ingested a standardized meal containing radiolabeled egg whites. Imaging was performed in the anterior projection for 120 minutes. Gastric emptying is calculated from the obtained images.  Findings: Subjectively normal emptying of tracer from stomach during 2 hours of imaging. At 2 hours minimal residual gastric tracer is identified. Quantitative analysis reveals 69% gastric emptying at 1 hour and 96% gastric emptying at 2 hours.  Findings represent normal gastric emptying.  IMPRESSION: Normal exam.  Original Report Authenticated By: Lollie Marrow, M.D.     Disposition: Home  Diet: Diabetic /carbmodified diet  Activity: Increase activity slowly.   Follow-up Appts: Discharge Orders    Future Orders  Please Complete By Expires   Diet general      Diet Carb Modified      Increase activity slowly      Discharge instructions      Comments:   Follow up with pcp in 1 week.   Increase activity slowly         TESTS THAT NEED FOLLOW-UP   Time spent on discharge, talking to the patient, and coordinating care: 60 mins.   SignedRamiro Harvest 01/01/2012, 4:33 PM

## 2012-01-01 NOTE — Progress Notes (Signed)
Patient in stable condition, discharged home. Patient expressed understanding of current medication regimen, follow-up appointments, and received education. Prescriptions given to patient.

## 2013-01-07 ENCOUNTER — Observation Stay (HOSPITAL_COMMUNITY)
Admission: EM | Admit: 2013-01-07 | Discharge: 2013-01-08 | Disposition: A | Discharge status: Home / Self Care | Payer: Medicare Other | Attending: Internal Medicine | Admitting: Internal Medicine

## 2013-01-07 ENCOUNTER — Emergency Department (HOSPITAL_COMMUNITY): Payer: Medicare Other

## 2013-01-07 ENCOUNTER — Encounter (HOSPITAL_COMMUNITY): Payer: Self-pay | Admitting: *Deleted

## 2013-01-07 DIAGNOSIS — J4489 Other specified chronic obstructive pulmonary disease: Secondary | ICD-10-CM | POA: Insufficient documentation

## 2013-01-07 DIAGNOSIS — R079 Chest pain, unspecified: Secondary | ICD-10-CM

## 2013-01-07 DIAGNOSIS — Z79899 Other long term (current) drug therapy: Secondary | ICD-10-CM | POA: Insufficient documentation

## 2013-01-07 DIAGNOSIS — R0789 Other chest pain: Secondary | ICD-10-CM | POA: Insufficient documentation

## 2013-01-07 DIAGNOSIS — R1011 Right upper quadrant pain: Secondary | ICD-10-CM | POA: Insufficient documentation

## 2013-01-07 DIAGNOSIS — R0602 Shortness of breath: Secondary | ICD-10-CM | POA: Insufficient documentation

## 2013-01-07 DIAGNOSIS — J449 Chronic obstructive pulmonary disease, unspecified: Secondary | ICD-10-CM

## 2013-01-07 DIAGNOSIS — F17200 Nicotine dependence, unspecified, uncomplicated: Secondary | ICD-10-CM | POA: Insufficient documentation

## 2013-01-07 DIAGNOSIS — I10 Essential (primary) hypertension: Secondary | ICD-10-CM | POA: Insufficient documentation

## 2013-01-07 DIAGNOSIS — E119 Type 2 diabetes mellitus without complications: Secondary | ICD-10-CM | POA: Insufficient documentation

## 2013-01-07 DIAGNOSIS — R55 Syncope and collapse: Principal | ICD-10-CM | POA: Insufficient documentation

## 2013-01-07 DIAGNOSIS — Z90710 Acquired absence of both cervix and uterus: Secondary | ICD-10-CM | POA: Insufficient documentation

## 2013-01-07 DIAGNOSIS — R0989 Other specified symptoms and signs involving the circulatory and respiratory systems: Secondary | ICD-10-CM | POA: Insufficient documentation

## 2013-01-07 DIAGNOSIS — E785 Hyperlipidemia, unspecified: Secondary | ICD-10-CM | POA: Insufficient documentation

## 2013-01-07 DIAGNOSIS — R0609 Other forms of dyspnea: Secondary | ICD-10-CM | POA: Insufficient documentation

## 2013-01-07 DIAGNOSIS — Z9089 Acquired absence of other organs: Secondary | ICD-10-CM | POA: Insufficient documentation

## 2013-01-07 LAB — POCT I-STAT TROPONIN I: Troponin i, poc: 0.01 ng/mL (ref 0.00–0.08)

## 2013-01-07 LAB — GLUCOSE, CAPILLARY: Glucose-Capillary: 157 mg/dL — ABNORMAL HIGH (ref 70–99)

## 2013-01-07 LAB — URINALYSIS, ROUTINE W REFLEX MICROSCOPIC
Glucose, UA: NEGATIVE mg/dL
Ketones, ur: NEGATIVE mg/dL
Leukocytes, UA: NEGATIVE
Nitrite: NEGATIVE
Protein, ur: NEGATIVE mg/dL

## 2013-01-07 LAB — COMPREHENSIVE METABOLIC PANEL
AST: 12 U/L (ref 0–37)
Albumin: 3.7 g/dL (ref 3.5–5.2)
Calcium: 9.1 mg/dL (ref 8.4–10.5)
Chloride: 100 mEq/L (ref 96–112)
Creatinine, Ser: 0.98 mg/dL (ref 0.50–1.10)
Total Bilirubin: 0.8 mg/dL (ref 0.3–1.2)
Total Protein: 7.1 g/dL (ref 6.0–8.3)

## 2013-01-07 LAB — CBC WITH DIFFERENTIAL/PLATELET
Basophils Absolute: 0 10*3/uL (ref 0.0–0.1)
Basophils Relative: 0 % (ref 0–1)
HCT: 49.8 % — ABNORMAL HIGH (ref 36.0–46.0)
MCHC: 35.3 g/dL (ref 30.0–36.0)
Monocytes Absolute: 0.5 10*3/uL (ref 0.1–1.0)
Neutro Abs: 4.9 10*3/uL (ref 1.7–7.7)
RDW: 13.4 % (ref 11.5–15.5)

## 2013-01-07 LAB — POCT I-STAT, CHEM 8
Creatinine, Ser: 1.1 mg/dL (ref 0.50–1.10)
HCT: 53 % — ABNORMAL HIGH (ref 36.0–46.0)
Hemoglobin: 18 g/dL — ABNORMAL HIGH (ref 12.0–15.0)
Potassium: 3.8 mEq/L (ref 3.5–5.1)
Sodium: 139 mEq/L (ref 135–145)
TCO2: 28 mmol/L (ref 0–100)

## 2013-01-07 LAB — LIPASE, BLOOD: Lipase: 39 U/L (ref 11–59)

## 2013-01-07 MED ORDER — HYDROCHLOROTHIAZIDE 25 MG PO TABS
25.0000 mg | ORAL_TABLET | Freq: Every day | ORAL | Status: DC
  Administered 2013-01-07 – 2013-01-08 (×2): 25 mg via ORAL
  Filled 2013-01-07 (×2): qty 1

## 2013-01-07 MED ORDER — SODIUM CHLORIDE 0.9 % IV SOLN
INTRAVENOUS | Status: DC
  Administered 2013-01-07 – 2013-01-08 (×2): via INTRAVENOUS

## 2013-01-07 MED ORDER — ONDANSETRON HCL 4 MG PO TABS: 4.0000 mg | ORAL_TABLET | Freq: Four times a day (QID) | ORAL | Status: DC | PRN

## 2013-01-07 MED ORDER — SODIUM CHLORIDE 0.9 % IV BOLUS (SEPSIS)
1000.0000 mL | Freq: Once | INTRAVENOUS | Status: AC
  Administered 2013-01-07: 1000 mL via INTRAVENOUS

## 2013-01-07 MED ORDER — SODIUM CHLORIDE 0.9 % IJ SOLN
3.0000 mL | Freq: Two times a day (BID) | INTRAMUSCULAR | Status: DC
  Administered 2013-01-07: 3 mL via INTRAVENOUS

## 2013-01-07 MED ORDER — BUDESONIDE-FORMOTEROL FUMARATE 160-4.5 MCG/ACT IN AERO
2.0000 | INHALATION_SPRAY | Freq: Two times a day (BID) | RESPIRATORY_TRACT | Status: DC
  Administered 2013-01-07 – 2013-01-08 (×2): 2 via RESPIRATORY_TRACT
  Filled 2013-01-07: qty 6

## 2013-01-07 MED ORDER — HYDROCODONE-ACETAMINOPHEN 5-325 MG PO TABS: 1.0000 | ORAL_TABLET | ORAL | Status: DC | PRN

## 2013-01-07 MED ORDER — AMITRIPTYLINE HCL 25 MG PO TABS
25.0000 mg | ORAL_TABLET | Freq: Every day | ORAL | Status: DC
  Administered 2013-01-07: 25 mg via ORAL
  Filled 2013-01-07 (×2): qty 1

## 2013-01-07 MED ORDER — LISINOPRIL 20 MG PO TABS
20.0000 mg | ORAL_TABLET | Freq: Every day | ORAL | Status: DC
  Administered 2013-01-07 – 2013-01-08 (×2): 20 mg via ORAL
  Filled 2013-01-07 (×2): qty 1

## 2013-01-07 MED ORDER — MORPHINE SULFATE 2 MG/ML IJ SOLN
1.0000 mg | INTRAMUSCULAR | Status: DC | PRN
  Administered 2013-01-07: 1 mg via INTRAVENOUS
  Filled 2013-01-07: qty 1

## 2013-01-07 MED ORDER — ACETAMINOPHEN 650 MG RE SUPP: 650.0000 mg | Freq: Four times a day (QID) | RECTAL | Status: DC | PRN

## 2013-01-07 MED ORDER — TIOTROPIUM BROMIDE MONOHYDRATE 18 MCG IN CAPS
18.0000 ug | ORAL_CAPSULE | Freq: Every day | RESPIRATORY_TRACT | Status: DC
  Administered 2013-01-07 – 2013-01-08 (×2): 18 ug via RESPIRATORY_TRACT
  Filled 2013-01-07: qty 5

## 2013-01-07 MED ORDER — ALBUTEROL SULFATE HFA 108 (90 BASE) MCG/ACT IN AERS
2.0000 | INHALATION_SPRAY | Freq: Four times a day (QID) | RESPIRATORY_TRACT | Status: DC | PRN
  Filled 2013-01-07: qty 6.7

## 2013-01-07 MED ORDER — ATORVASTATIN CALCIUM 20 MG PO TABS
20.0000 mg | ORAL_TABLET | Freq: Every day | ORAL | Status: DC
  Administered 2013-01-07: 20 mg via ORAL
  Filled 2013-01-07 (×2): qty 1

## 2013-01-07 MED ORDER — ONDANSETRON HCL 4 MG/2ML IJ SOLN: 4.0000 mg | Freq: Four times a day (QID) | INTRAMUSCULAR | Status: DC | PRN

## 2013-01-07 MED ORDER — ACETAMINOPHEN 325 MG PO TABS: 650.0000 mg | ORAL_TABLET | Freq: Four times a day (QID) | ORAL | Status: DC | PRN

## 2013-01-07 MED ORDER — LISINOPRIL-HYDROCHLOROTHIAZIDE 20-25 MG PO TABS: 1.0000 | ORAL_TABLET | Freq: Every day | ORAL | Status: DC

## 2013-01-07 MED ORDER — METFORMIN HCL 500 MG PO TABS
500.0000 mg | ORAL_TABLET | Freq: Two times a day (BID) | ORAL | Status: DC
  Administered 2013-01-08: 500 mg via ORAL
  Filled 2013-01-07 (×3): qty 1

## 2013-01-07 NOTE — ED Provider Notes (Signed)
History     CSN: [id number]  Arrival date & time 01/07/13  1313   First MD Initiated Contact with Patient 01/07/13 1420      Chief Complaint  Patient presents with  . Dizziness    (Consider location/radiation/quality/duration/timing/severity/associated sxs/prior treatment) HPI Comments: 67 year old female who is a hypertensive, diabetic, COPD patient presents with a complaint of near syncope and nausea and vomiting that occurred this morning when she tried to sit up on the side of the bed. When she was perfectly still or lays down she does not have symptoms, when she gets to standing or sitting position she becomes severely near syncopal. She denies any syncopal event, denies palpitations or chest pain this morning. She states that she had associated right upper quadrant pain that had been radiating to her back. She reports a history over the last several months of having exertional pain and dyspnea stating that she gets a left of lower sternum pain radiating to her left shoulder when she exerts herself for a period of time that requires her to rest for the pain to go away. She does not have that pain this morning but it has been coming more frequent almost daily. Her right upper quadrant pain is still present, it is mild, not associated with changes in bowel habits or urinary frequency or dysuria. She has a history of cholecystitis in the past status post surgery.  She still smokes cigarettes  The patient also notes that she has been losing her hair over the last several months  The history is provided by the patient.    Past Medical History  Diagnosis Date  . Diabetes mellitus   . Hypertension   . COPD (chronic obstructive pulmonary disease)   . Bronchiectasis   . Chronic pain   . Anxiety   . Tracheobronchitis 01/01/2012    Past Surgical History  Procedure Laterality Date  . Appendectomy    . Cholecystectomy    . Abdominal hysterectomy    . Joint replacement    . Fracture  surgery      Family History  Problem Relation Age of Onset  . Heart attack Mother     History  Substance Use Topics  . Smoking status: Current Every Day Smoker -- 1.00 packs/day for 39 years    Types: Cigarettes  . Smokeless tobacco: Never Used  . Alcohol Use: No    OB History   Grav Para Term Preterm Abortions TAB SAB Ect Mult Living                  Review of Systems  All other systems reviewed and are negative.    Allergies  Aspartame and phenylalanine; Codeine; Penicillins; Prednisone; and Sulfa antibiotics  Home Medications   Current Outpatient Rx  Name  Route  Sig  Dispense  Refill  . albuterol (PROVENTIL HFA;VENTOLIN HFA) 108 (90 BASE) MCG/ACT inhaler   Inhalation   Inhale 2 puffs into the lungs every 6 (six) hours as needed. For shortness of breath.   1 Inhaler   1   . amitriptyline (ELAVIL) 25 MG tablet   Oral   Take 25 mg by mouth at bedtime.         . budesonide-formoterol (SYMBICORT) 160-4.5 MCG/ACT inhaler   Inhalation   Inhale 2 puffs into the lungs 2 (two) times daily.   1 Inhaler   0   . lisinopril-hydrochlorothiazide (PRINZIDE,ZESTORETIC) 20-25 MG per tablet   Oral   Take 1 tablet by mouth daily.         Marland Kitchen  metFORMIN (GLUCOPHAGE) 500 MG tablet   Oral   Take 500 mg by mouth 2 (two) times daily with a meal.          . rosuvastatin (CRESTOR) 10 MG tablet   Oral   Take 10 mg by mouth daily.         Marland Kitchen tiotropium (SPIRIVA) 18 MCG inhalation capsule   Inhalation   Place 1 capsule (18 mcg total) into inhaler and inhale daily.   30 capsule   0     BP 135/55  Pulse 69  Temp(Src) 98.3 F (36.8 C) (Oral)  Resp 16  SpO2 92%  Physical Exam  Nursing note and vitals reviewed. Constitutional: She appears well-developed and well-nourished. No distress.  HENT:  Head: Normocephalic and atraumatic.  Mouth/Throat: Oropharynx is clear and moist. No oropharyngeal exudate.  Eyes: Conjunctivae and EOM are normal. Pupils are equal,  round, and reactive to light. Right eye exhibits no discharge. Left eye exhibits no discharge. No scleral icterus.  Neck: Normal range of motion. Neck supple. No JVD present. No thyromegaly present.  Cardiovascular: Normal rate, regular rhythm, normal heart sounds and intact distal pulses.  Exam reveals no gallop and no friction rub.   No murmur heard. Pulmonary/Chest: Effort normal and breath sounds normal. No respiratory distress. She has no wheezes. She has no rales.  Abdominal: Soft. Bowel sounds are normal. She exhibits no distension and no mass. There is tenderness.  Right upper quadrant and epigastric tenderness to palpation, no guarding, no masses, no peritoneal signs  Musculoskeletal: Normal range of motion. She exhibits no edema and no tenderness.  Lymphadenopathy:    She has no cervical adenopathy.  Neurological: She is alert. Coordination normal.  Skin: Skin is warm and dry. No rash noted. No erythema.  Hair loss on arms and legs, severe thinning of the scalp hair.  Psychiatric: She has a normal mood and affect. Her behavior is normal.    ED Course  Procedures (including critical care time)  Labs Reviewed  CBC WITH DIFFERENTIAL - Abnormal; Notable for the following:    RBC 5.91 (*)    Hemoglobin 17.6 (*)    HCT 49.8 (*)    All other components within normal limits  POCT I-STAT, CHEM 8 - Abnormal; Notable for the following:    Hemoglobin 18.0 (*)    HCT 53.0 (*)    All other components within normal limits  URINALYSIS, ROUTINE W REFLEX MICROSCOPIC  COMPREHENSIVE METABOLIC PANEL  LIPASE, BLOOD  TSH  T4  POCT I-STAT TROPONIN I   Dg Chest Port 1 View  01/07/2013  *RADIOLOGY REPORT*  Clinical Data: Chest pain  PORTABLE CHEST - 1 VIEW  Comparison: 12/24/2011  Findings: Cardiomediastinal silhouette is stable.  No acute infiltrate or pleural effusion.  No pulmonary edema.  Mild hyperinflation again noted.  IMPRESSION: No active disease.  No significant change.   Original  Report Authenticated By: Natasha Mead, M.D.      1. Near syncope   2. Chest pain       MDM  The patient has a history and a story that is concerning for anginal symptoms but her acute exacerbation of symptoms this morning is vague and nonspecific. She has arty had cholecystitis status post removal of the gallbladder, would also consider that this is atypical anginal symptoms, transaminitis or hepatitis, transient arrhythmia, possible vertigo however when I have the patient sit up in bed and move her head from side to side in all directions I cannot  induce any of her prior symptoms. Her vital signs are normal at this time, workup to ensue for chest pain and abdominal pain.  ED ECG REPORT  I personally interpreted this EKG   Date: 01/07/2013   Rate: 64  Rhythm: normal sinus rhythm  QRS Axis: normal  Intervals: normal  ST/T Wave abnormalities: normal  Conduction Disutrbances:none  Narrative Interpretation:   Old EKG Reviewed: Compared with 12/24/2011, rate slowed, no other changes   Patient has negative workup for acute findings, I discussed her care with the [facility] hospitalist will admit for ongoing care and evaluation of the patient's symptoms and her apparent anginal symptoms. There is no evidence of elevated troponin or ischemia on EKG.      Vida Roller, MD 01/07/13 [phone]

## 2013-01-07 NOTE — ED Notes (Signed)
Report called to the floor rm [id number]  Needs zapped nurse will call me back

## 2013-01-07 NOTE — ED Notes (Signed)
Pt's room still not zapped per Floor Unit RN.

## 2013-01-07 NOTE — ED Notes (Signed)
Pt woke this am and felt dizzy and vomited, c/o of right sided abdominal pain that radiates to her back

## 2013-01-07 NOTE — H&P (Signed)
[facility] History and Physical  Victoria Bird UJW:[id number] DOB: 1946-09-13 DOA: 01/07/2013  Referring physician: ER physician PCP: No primary provider on file.   Chief Complaint: near syncope  HPI:  67 year old female with past medical history of HTN, dyslipidemia, DM who has presented to [facility] ED 01/07/13 with near syncopal event while sitting in a chair. This occurred the in the morning of this admission. Patient reported shortness of breath prior to this event and mild chest discomfort which is at this time resolved. She also had right upper quadrant pain that lasted for about 30 minutes and subsequently resolved on its own. She reports that while in ED her abdominal pain started radiating across mid abdomen and it is 2-3/10 in intensity. No reports of vomiting, no diarrhea or constipation. No reports of fever or chills. No loss of consciousness. In ED, evaluation included CXR which was normal. Her CBC and BMP was essentially unremarkable. Her 12-lead EKG showed NSR. The first set of cardic enzymes was WNL. BP remains stable in ED and on the arrival to the floor unit, 135/55.  Assessment and Plan:  Principal Problem:   Near syncope - unclear etiology, possible dehydration - cardiac enzymes x 1 set normal, 12 lead EKG with NSR - we will continue supportive care with IV fluids  - cycle cardiac enzymes and follow up 2 D ECHO results - adm for 24 hours observation Active Problems:   Diabetes mellitus - continue metformin   Hypertension - continue prinizide   COPD (chronic obstructive pulmonary disease) - continue symbicort, Spiriva and albuterol rescue inhaler   Manson Passey [facility] [phone]  Review of Systems:  Constitutional: Negative for fever, chills and malaise/fatigue. Negative for diaphoresis.  HENT: Negative for hearing loss, ear pain, nosebleeds, congestion, sore throat, neck pain, tinnitus and ear discharge.  Eyes: Negative for blurred vision, double vision,  photophobia, pain, discharge and redness.  Respiratory: Negative for cough, hemoptysis, sputum production, mild shortness of breath, no wheezing and stridor.   Cardiovascular: Negative for chest pain, palpitations, orthopnea, claudication and leg swelling.  Gastrointestinal: right upper abdominal pain, nausea but no vomiting Genitourinary: Negative for dysuria, urgency, frequency, hematuria and flank pain.  Musculoskeletal: Negative for myalgias, back pain, joint pain and falls.  Skin: Negative for itching and rash.  Neurological: positive for dizziness and weakness. Negative for tingling, tremors, sensory change, speech change, focal weakness, loss of consciousness and headaches.  Endo/Heme/Allergies: Negative for environmental allergies and polydipsia. Does not bruise/bleed easily.  Psychiatric/Behavioral: Negative for suicidal ideas. The patient is not nervous/anxious.      Past Medical History  Diagnosis Date  . Diabetes mellitus   . Hypertension   . COPD (chronic obstructive pulmonary disease)   . Bronchiectasis   . Chronic pain   . Anxiety   . Tracheobronchitis 01/01/2012   Past Surgical History  Procedure Laterality Date  . Appendectomy    . Cholecystectomy    . Abdominal hysterectomy    . Joint replacement    . Fracture surgery     Social History:  reports that she has been smoking Cigarettes.  She has a 39 pack-year smoking history. She has never used smokeless tobacco. She reports that she does not drink alcohol or use illicit drugs.  Allergies  Allergen Reactions  . Aspartame And Phenylalanine Nausea And Vomiting    Patient says she is allergic to all artificial sweeteners  . Codeine   . Penicillins   . Prednisone   . Sulfa Antibiotics  Family History:  Family History  Problem Relation Age of Onset  . Heart attack Mother      Prior to Admission medications   Medication Sig Start Date End Date Taking? Authorizing Provider  albuterol (PROVENTIL  HFA;VENTOLIN HFA) 108 (90 BASE) MCG/ACT inhaler Inhale 2 puffs into the lungs every 6 (six) hours as needed. For shortness of breath. 12/18/11  Yes Leroy Sea, MD  amitriptyline (ELAVIL) 25 MG tablet Take 25 mg by mouth at bedtime.   Yes Historical Provider, MD  budesonide-formoterol (SYMBICORT) 160-4.5 MCG/ACT inhaler Inhale 2 puffs into the lungs 2 (two) times daily. 01/01/12  Yes Rodolph Bong, MD  lisinopril-hydrochlorothiazide (PRINZIDE,ZESTORETIC) 20-25 MG per tablet Take 1 tablet by mouth daily.   Yes Historical Provider, MD  metFORMIN (GLUCOPHAGE) 500 MG tablet Take 500 mg by mouth 2 (two) times daily with a meal.    Yes Historical Provider, MD  rosuvastatin (CRESTOR) 10 MG tablet Take 10 mg by mouth daily.   Yes Historical Provider, MD  tiotropium (SPIRIVA) 18 MCG inhalation capsule Place 1 capsule (18 mcg total) into inhaler and inhale daily. 01/01/12  Yes Rodolph Bong, MD   Physical Exam: Filed Vitals:   01/07/13 1333  BP: 135/55  Pulse: 69  Temp: 98.3 F (36.8 C)  TempSrc: Oral  Resp: 16  SpO2: 92%    Physical Exam  Constitutional: Appears well-developed and well-nourished. No distress.  HENT: Normocephalic. External right and left ear normal. Oropharynx is clear and moist.  Eyes: Conjunctivae and EOM are normal. PERRLA, no scleral icterus.  Neck: Normal ROM. Neck supple. No JVD. No tracheal deviation. No thyromegaly.  CVS: RRR, S1/S2 +, no murmurs, no gallops, no carotid bruit.  Pulmonary: Effort and breath sounds normal, no stridor, rhonchi, wheezes, rales.  Abdominal: Soft. BS +,  no distension, mild tenderness in mid abdomen but no rebound or guarding.  Musculoskeletal: Normal range of motion. No edema and no tenderness.  Lymphadenopathy: No lymphadenopathy noted, cervical, inguinal. Neuro: Alert. Normal reflexes, muscle tone coordination. No cranial nerve deficit. Skin: Skin is warm and dry. No rash noted. Not diaphoretic. No erythema. No pallor.   Psychiatric: Normal mood and affect. Behavior, judgment, thought content normal.   Labs on Admission:  Basic Metabolic Panel:  Recent Labs Lab 01/07/13 1438 01/07/13 1519  NA 137 139  K 3.9 3.8  CL 100 103  CO2 26  --   GLUCOSE 89 89  BUN 15 16  CREATININE 0.98 1.10  CALCIUM 9.1  --    Liver Function Tests:  Recent Labs Lab 01/07/13 1438  AST 12  ALT 10  ALKPHOS 95  BILITOT 0.8  PROT 7.1  ALBUMIN 3.7    Recent Labs Lab 01/07/13 1438  LIPASE 39   No results found for this basename: AMMONIA,  in the last 168 hours CBC:  Recent Labs Lab 01/07/13 1438 01/07/13 1519  WBC 8.3  --   NEUTROABS 4.9  --   HGB 17.6* 18.0*  HCT 49.8* 53.0*  MCV 84.3  --   PLT 191  --    Cardiac Enzymes: No results found for this basename: CKTOTAL, CKMB, CKMBINDEX, TROPONINI,  in the last 168 hours BNP: No components found with this basename: POCBNP,  CBG: No results found for this basename: GLUCAP,  in the last 168 hours  Radiological Exams on Admission: Dg Chest Port 1 View  01/07/2013  *RADIOLOGY REPORT*  Clinical Data: Chest pain  PORTABLE CHEST - 1 VIEW  Comparison: 12/24/2011  Findings: Cardiomediastinal silhouette is stable.  No acute infiltrate or pleural effusion.  No pulmonary edema.  Mild hyperinflation again noted.  IMPRESSION: No active disease.  No significant change.   Original Report Authenticated By: Natasha Mead, M.D.     EKG: Normal sinus rhythm, no ST/T wave changes  Code Status: Full Family Communication: Pt at bedside Disposition Plan: Admit for further evaluation  Manson Passey, MD  [facility] Pager [phone]  If 7PM-7AM, please contact night-coverage [email or web address] Password [facility] 01/07/2013, 3:59 PM

## 2013-01-08 DIAGNOSIS — R072 Precordial pain: Secondary | ICD-10-CM

## 2013-01-08 DIAGNOSIS — R55 Syncope and collapse: Secondary | ICD-10-CM

## 2013-01-08 DIAGNOSIS — E119 Type 2 diabetes mellitus without complications: Secondary | ICD-10-CM

## 2013-01-08 DIAGNOSIS — I10 Essential (primary) hypertension: Secondary | ICD-10-CM

## 2013-01-08 LAB — COMPREHENSIVE METABOLIC PANEL
AST: 10 U/L (ref 0–37)
BUN: 14 mg/dL (ref 6–23)
CO2: 28 mEq/L (ref 19–32)
Calcium: 8.3 mg/dL — ABNORMAL LOW (ref 8.4–10.5)
Chloride: 105 mEq/L (ref 96–112)
Creatinine, Ser: 1.01 mg/dL (ref 0.50–1.10)
GFR calc Af Amer: 66 mL/min — ABNORMAL LOW (ref >90)
GFR calc non Af Amer: 57 mL/min — ABNORMAL LOW (ref >90)
Glucose, Bld: 80 mg/dL (ref 70–99)
Total Bilirubin: 0.5 mg/dL (ref 0.3–1.2)

## 2013-01-08 LAB — GLUCOSE, CAPILLARY: Glucose-Capillary: 80 mg/dL (ref 70–99)

## 2013-01-08 LAB — TROPONIN I: Troponin I: <0.3 ng/mL (ref <0.30)

## 2013-01-08 LAB — CBC
MCH: 28.7 pg (ref 26.0–34.0)
Platelets: 164 10*3/uL (ref 150–400)
RBC: 5.06 MIL/uL (ref 3.87–5.11)

## 2013-01-08 LAB — T4: T4, Total: 10.4 ug/dL (ref 5.0–12.5)

## 2013-01-08 LAB — TSH: TSH: 2.133 u[IU]/mL (ref 0.350–4.500)

## 2013-01-08 NOTE — Progress Notes (Signed)
  Echocardiogram 2D Echocardiogram has been performed.  Victoria Bird 01/08/2013, 9:00 AM

## 2013-01-08 NOTE — Discharge Summary (Signed)
Physician Discharge Summary  Victoria Bird ZOX:[id number] DOB: [birth year] DOA: 01/07/2013  PCP: No primary provider on file.  Admit date: 01/07/2013 Discharge date: 01/08/2013  Time spent: 35 minutes  Recommendations for Outpatient Follow-up:  1. Follow with PCP as instructed   Discharge Diagnoses:  Principal Problem:   Near syncope Active Problems:   Diabetes mellitus   Hypertension   COPD (chronic obstructive pulmonary disease)  Discharge Condition: stable  Diet recommendation: heart healthy, diabetic.   Filed Weights   01/07/13 2039 01/08/13 0523  Weight: 74.208 kg (163 lb 9.6 oz) 73.664 kg (162 lb 6.4 oz)    History of present illness:  67 year old female with past medical history of HTN, dyslipidemia, DM who has presented to [facility] ED 01/07/13 with near syncopal event while sitting in a chair. This occurred the in the morning of this admission. Patient reported shortness of breath prior to this event and mild chest discomfort which is at this time resolved. She also had right upper quadrant pain that lasted for about 30 minutes and subsequently resolved on its own. She reports that while in ED her abdominal pain started radiating across mid abdomen and it is 2-3/10 in intensity. No reports of vomiting, no diarrhea or constipation. No reports of fever or chills. No loss of consciousness.  In ED, evaluation included CXR which was normal. Her CBC and BMP was essentially unremarkable. Her 12-lead EKG showed NSR. The first set of cardic enzymes was WNL. BP remains stable in ED and on the arrival to the floor unit, 135/55.  Hospital Course:  Near syncope - unclear etiology, possible dehydration, the symptoms completely resolved with IV hydration. Cardiac enzymes normal, 12 lead EKG with NSR and without events on telemetry. 2D echo (read below) showed normal systolic function without wall motion abnormalities. Diabetes mellitus - continue metformin  Hypertension - continue prinizide  COPD  (chronic obstructive pulmonary disease) - continue symbicort, Spiriva and albuterol rescue inhaler  Procedures:  2D echo Study Conclusions - Left ventricle: The cavity size was normal. Wall thickness was normal. Systolic function was normal. The estimated ejection fraction was in the range of 60% to 65%. Wall motion was normal; there were no regional wall motion abnormalities. - Aortic valve: Trivial regurgitation. - Pulmonary arteries: Systolic pressure was mildly increased. PA peak pressure: 43mm Hg (S).  Consultations:  none  Discharge Exam: Filed Vitals:   01/07/13 2125 01/08/13 0523 01/08/13 0846 01/08/13 1025  BP:  129/52  123/102  Pulse: 77 50    Temp:  97.8 F (36.6 C)    TempSrc:  Oral    Resp: 18 20    Height:      Weight:  73.664 kg (162 lb 6.4 oz)    SpO2: 94% 90% 93%     General: NAD Cardiovascular: RRR Respiratory: CTA biL  Discharge Instructions     Medication List    TAKE these medications       albuterol 108 (90 BASE) MCG/ACT inhaler  Commonly known as:  PROVENTIL HFA;VENTOLIN HFA  Inhale 2 puffs into the lungs every 6 (six) hours as needed. For shortness of breath.     amitriptyline 25 MG tablet  Commonly known as:  ELAVIL  Take 25 mg by mouth at bedtime.     budesonide-formoterol 160-4.5 MCG/ACT inhaler  Commonly known as:  SYMBICORT  Inhale 2 puffs into the lungs 2 (two) times daily.     lisinopril-hydrochlorothiazide 20-25 MG per tablet  Commonly known as:  PRINZIDE,ZESTORETIC  Take  1 tablet by mouth daily.     metFORMIN 500 MG tablet  Commonly known as:  GLUCOPHAGE  Take 500 mg by mouth 2 (two) times daily with a meal.     rosuvastatin 10 MG tablet  Commonly known as:  CRESTOR  Take 10 mg by mouth daily.     tiotropium 18 MCG inhalation capsule  Commonly known as:  SPIRIVA  Place 1 capsule (18 mcg total) into inhaler and inhale daily.           Follow-up Information   Follow up with Dr. Wynona Neat. Schedule an  appointment as soon as possible for a visit in 2 weeks. (Follow up with Primary Care Physicians in 1-2 weeks)    Contact information:   [address] [place], [place] [address] [phone]        The results of significant diagnostics from this hospitalization (including imaging, microbiology, ancillary and laboratory) are listed below for reference.    Significant Diagnostic Studies: Dg Chest Port 1 View  01/07/2013  *RADIOLOGY REPORT*  Clinical Data: Chest pain  PORTABLE CHEST - 1 VIEW  Comparison: 12/24/2011  Findings: Cardiomediastinal silhouette is stable.  No acute infiltrate or pleural effusion.  No pulmonary edema.  Mild hyperinflation again noted.  IMPRESSION: No active disease.  No significant change.   Original Report Authenticated By: Natasha Mead, M.D.    Labs: Basic Metabolic Panel:  Recent Labs Lab 01/07/13 1438 01/07/13 1519 01/08/13 0445  NA 137 139 140  K 3.9 3.8 3.6  CL 100 103 105  CO2 26  --  28  GLUCOSE 89 89 80  BUN 15 16 14   CREATININE 0.98 1.10 1.01  CALCIUM 9.1  --  8.3*   Liver Function Tests:  Recent Labs Lab 01/07/13 1438 01/08/13 0445  AST 12 10  ALT 10 7  ALKPHOS 95 71  BILITOT 0.8 0.5  PROT 7.1 5.5*  ALBUMIN 3.7 2.9*    Recent Labs Lab 01/07/13 1438  LIPASE 39   CBC:  Recent Labs Lab 01/07/13 1438 01/07/13 1519 01/08/13 0445  WBC 8.3  --  6.9  NEUTROABS 4.9  --   --   HGB 17.6* 18.0* 14.5  HCT 49.8* 53.0* 43.3  MCV 84.3  --  85.6  PLT 191  --  164   Cardiac Enzymes:  Recent Labs Lab 01/08/13 1145  TROPONINI <0.30   CBG:  Recent Labs Lab 01/07/13 2012 01/08/13 0746  GLUCAP 157* 80   Signed:  GHERGHE, COSTIN  [facility] 01/08/2013, 6:15 PM

## 2013-04-09 ENCOUNTER — Inpatient Hospital Stay (HOSPITAL_COMMUNITY)
Admission: EM | Admit: 2013-04-09 | Discharge: 2013-04-12 | DRG: 193 | Disposition: A | Discharge status: Home / Self Care | Payer: Medicare Other | Attending: Internal Medicine | Admitting: Internal Medicine

## 2013-04-09 ENCOUNTER — Encounter (HOSPITAL_COMMUNITY): Payer: Self-pay | Admitting: Emergency Medicine

## 2013-04-09 ENCOUNTER — Emergency Department (HOSPITAL_COMMUNITY): Payer: Medicare Other

## 2013-04-09 DIAGNOSIS — F17200 Nicotine dependence, unspecified, uncomplicated: Secondary | ICD-10-CM | POA: Diagnosis present

## 2013-04-09 DIAGNOSIS — J441 Chronic obstructive pulmonary disease with (acute) exacerbation: Secondary | ICD-10-CM | POA: Diagnosis present

## 2013-04-09 DIAGNOSIS — N179 Acute kidney failure, unspecified: Secondary | ICD-10-CM | POA: Diagnosis present

## 2013-04-09 DIAGNOSIS — I10 Essential (primary) hypertension: Secondary | ICD-10-CM | POA: Diagnosis present

## 2013-04-09 DIAGNOSIS — J9601 Acute respiratory failure with hypoxia: Secondary | ICD-10-CM

## 2013-04-09 DIAGNOSIS — J9600 Acute respiratory failure, unspecified whether with hypoxia or hypercapnia: Secondary | ICD-10-CM | POA: Diagnosis present

## 2013-04-09 DIAGNOSIS — G8929 Other chronic pain: Secondary | ICD-10-CM | POA: Diagnosis present

## 2013-04-09 DIAGNOSIS — E119 Type 2 diabetes mellitus without complications: Secondary | ICD-10-CM | POA: Diagnosis present

## 2013-04-09 DIAGNOSIS — J189 Pneumonia, unspecified organism: Principal | ICD-10-CM | POA: Diagnosis present

## 2013-04-09 DIAGNOSIS — F411 Generalized anxiety disorder: Secondary | ICD-10-CM | POA: Diagnosis present

## 2013-04-09 LAB — POCT I-STAT TROPONIN I: Troponin i, poc: 0 ng/mL (ref 0.00–0.08)

## 2013-04-09 LAB — CBC
HCT: 47.2 % — ABNORMAL HIGH (ref 36.0–46.0)
MCHC: 33.9 g/dL (ref 30.0–36.0)
Platelets: 133 10*3/uL — ABNORMAL LOW (ref 150–400)
RDW: 13.4 % (ref 11.5–15.5)
WBC: 7.7 10*3/uL (ref 4.0–10.5)

## 2013-04-09 MED ORDER — IPRATROPIUM BROMIDE 0.02 % IN SOLN
0.5000 mg | Freq: Once | RESPIRATORY_TRACT | Status: AC
  Administered 2013-04-09: 0.5 mg via RESPIRATORY_TRACT
  Filled 2013-04-09: qty 2.5

## 2013-04-09 MED ORDER — ALBUTEROL SULFATE (5 MG/ML) 0.5% IN NEBU
5.0000 mg | INHALATION_SOLUTION | Freq: Once | RESPIRATORY_TRACT | Status: AC
  Administered 2013-04-09: 5 mg via RESPIRATORY_TRACT
  Filled 2013-04-09: qty 1

## 2013-04-09 NOTE — ED Notes (Signed)
Pt states that Sunday and Monday she was very tired and laid around in bed all day  Pt states Monday she started to have a cough  Tues and today the cough has been worse  Pt states she is sore from coughing so much and that she just feels very run down  Pt has COPD  Pt has dry nonproductive cough in triage

## 2013-04-09 NOTE — ED Notes (Signed)
Patient transported to X-ray 

## 2013-04-10 ENCOUNTER — Encounter (HOSPITAL_COMMUNITY): Payer: Self-pay | Admitting: Internal Medicine

## 2013-04-10 DIAGNOSIS — J441 Chronic obstructive pulmonary disease with (acute) exacerbation: Secondary | ICD-10-CM | POA: Diagnosis present

## 2013-04-10 DIAGNOSIS — J9600 Acute respiratory failure, unspecified whether with hypoxia or hypercapnia: Secondary | ICD-10-CM

## 2013-04-10 DIAGNOSIS — E119 Type 2 diabetes mellitus without complications: Secondary | ICD-10-CM

## 2013-04-10 DIAGNOSIS — I10 Essential (primary) hypertension: Secondary | ICD-10-CM

## 2013-04-10 DIAGNOSIS — J189 Pneumonia, unspecified organism: Secondary | ICD-10-CM | POA: Diagnosis present

## 2013-04-10 DIAGNOSIS — J9601 Acute respiratory failure with hypoxia: Secondary | ICD-10-CM | POA: Diagnosis present

## 2013-04-10 LAB — BASIC METABOLIC PANEL
BUN: 14 mg/dL (ref 6–23)
BUN: 18 mg/dL (ref 6–23)
Calcium: 9.2 mg/dL (ref 8.4–10.5)
Chloride: 102 mEq/L (ref 96–112)
Creatinine, Ser: 0.93 mg/dL (ref 0.50–1.10)
Creatinine, Ser: 0.95 mg/dL (ref 0.50–1.10)
GFR calc Af Amer: 71 mL/min — ABNORMAL LOW (ref >90)
GFR calc Af Amer: 73 mL/min — ABNORMAL LOW (ref >90)
GFR calc non Af Amer: 63 mL/min — ABNORMAL LOW (ref >90)

## 2013-04-10 LAB — CBC
HCT: 44.9 % (ref 36.0–46.0)
MCHC: 33.2 g/dL (ref 30.0–36.0)
MCV: 86.8 fL (ref 78.0–100.0)
RDW: 13.7 % (ref 11.5–15.5)

## 2013-04-10 LAB — GLUCOSE, CAPILLARY
Glucose-Capillary: 167 mg/dL — ABNORMAL HIGH (ref 70–99)
Glucose-Capillary: 177 mg/dL — ABNORMAL HIGH (ref 70–99)

## 2013-04-10 MED ORDER — IPRATROPIUM BROMIDE 0.02 % IN SOLN
0.5000 mg | RESPIRATORY_TRACT | Status: DC | PRN
  Administered 2013-04-10: 0.5 mg via RESPIRATORY_TRACT
  Filled 2013-04-10: qty 2.5

## 2013-04-10 MED ORDER — ACETAMINOPHEN 325 MG PO TABS
650.0000 mg | ORAL_TABLET | Freq: Four times a day (QID) | ORAL | Status: DC | PRN
  Administered 2013-04-10: 650 mg via ORAL
  Filled 2013-04-10: qty 2

## 2013-04-10 MED ORDER — TIOTROPIUM BROMIDE MONOHYDRATE 18 MCG IN CAPS
18.0000 ug | ORAL_CAPSULE | Freq: Every day | RESPIRATORY_TRACT | Status: DC
  Administered 2013-04-11: 18 ug via RESPIRATORY_TRACT
  Filled 2013-04-10: qty 5

## 2013-04-10 MED ORDER — HYDROMORPHONE HCL PF 1 MG/ML IJ SOLN
1.0000 mg | Freq: Once | INTRAMUSCULAR | Status: AC
  Administered 2013-04-10: 1 mg via INTRAVENOUS
  Filled 2013-04-10: qty 1

## 2013-04-10 MED ORDER — ALBUTEROL SULFATE (5 MG/ML) 0.5% IN NEBU
2.5000 mg | INHALATION_SOLUTION | RESPIRATORY_TRACT | Status: DC
  Administered 2013-04-10 – 2013-04-12 (×6): 2.5 mg via RESPIRATORY_TRACT
  Filled 2013-04-10 (×5): qty 0.5

## 2013-04-10 MED ORDER — BENZONATATE 100 MG PO CAPS
200.0000 mg | ORAL_CAPSULE | Freq: Once | ORAL | Status: AC
  Administered 2013-04-10: 200 mg via ORAL
  Filled 2013-04-10: qty 2

## 2013-04-10 MED ORDER — ALBUTEROL SULFATE (5 MG/ML) 0.5% IN NEBU
2.5000 mg | INHALATION_SOLUTION | RESPIRATORY_TRACT | Status: DC | PRN
  Administered 2013-04-10: 2.5 mg via RESPIRATORY_TRACT
  Filled 2013-04-10 (×3): qty 0.5

## 2013-04-10 MED ORDER — IPRATROPIUM BROMIDE 0.02 % IN SOLN
0.5000 mg | RESPIRATORY_TRACT | Status: DC
  Administered 2013-04-10 (×3): 0.5 mg via RESPIRATORY_TRACT
  Filled 2013-04-10 (×3): qty 2.5

## 2013-04-10 MED ORDER — ONDANSETRON HCL 4 MG/2ML IJ SOLN: 4.0000 mg | Freq: Four times a day (QID) | INTRAMUSCULAR | Status: DC | PRN

## 2013-04-10 MED ORDER — HYDROCHLOROTHIAZIDE 25 MG PO TABS
25.0000 mg | ORAL_TABLET | Freq: Every day | ORAL | Status: DC
  Administered 2013-04-10 – 2013-04-11 (×2): 25 mg via ORAL
  Filled 2013-04-10 (×2): qty 1

## 2013-04-10 MED ORDER — HYDROMORPHONE HCL PF 1 MG/ML IJ SOLN
0.5000 mg | Freq: Four times a day (QID) | INTRAMUSCULAR | Status: DC | PRN
  Administered 2013-04-10: 0.5 mg via INTRAVENOUS
  Administered 2013-04-10 – 2013-04-11 (×2): 1 mg via INTRAVENOUS
  Filled 2013-04-10 (×3): qty 1

## 2013-04-10 MED ORDER — LEVOFLOXACIN IN D5W 750 MG/150ML IV SOLN
750.0000 mg | Freq: Every day | INTRAVENOUS | Status: DC
  Administered 2013-04-10 – 2013-04-12 (×3): 750 mg via INTRAVENOUS
  Filled 2013-04-10 (×3): qty 150

## 2013-04-10 MED ORDER — LISINOPRIL 20 MG PO TABS
20.0000 mg | ORAL_TABLET | Freq: Every day | ORAL | Status: DC
  Administered 2013-04-10 – 2013-04-11 (×2): 20 mg via ORAL
  Filled 2013-04-10 (×2): qty 1

## 2013-04-10 MED ORDER — SODIUM CHLORIDE 0.9 % IJ SOLN
3.0000 mL | Freq: Two times a day (BID) | INTRAMUSCULAR | Status: DC
  Administered 2013-04-10 – 2013-04-12 (×2): 3 mL via INTRAVENOUS

## 2013-04-10 MED ORDER — ENSURE COMPLETE PO LIQD
237.0000 mL | Freq: Two times a day (BID) | ORAL | Status: DC
  Administered 2013-04-10 – 2013-04-12 (×4): 237 mL via ORAL

## 2013-04-10 MED ORDER — ACETAMINOPHEN 650 MG RE SUPP: 650.0000 mg | Freq: Four times a day (QID) | RECTAL | Status: DC | PRN

## 2013-04-10 MED ORDER — BUDESONIDE 0.25 MG/2ML IN SUSP
0.2500 mg | Freq: Two times a day (BID) | RESPIRATORY_TRACT | Status: DC
  Administered 2013-04-10 – 2013-04-11 (×3): 0.25 mg via RESPIRATORY_TRACT
  Filled 2013-04-10 (×7): qty 2

## 2013-04-10 MED ORDER — SODIUM CHLORIDE 0.9 % IJ SOLN
3.0000 mL | Freq: Two times a day (BID) | INTRAMUSCULAR | Status: DC
  Administered 2013-04-10 – 2013-04-12 (×3): 3 mL via INTRAVENOUS

## 2013-04-10 MED ORDER — METHYLPREDNISOLONE SODIUM SUCC 40 MG IJ SOLR
40.0000 mg | Freq: Every day | INTRAMUSCULAR | Status: DC
  Administered 2013-04-10: 40 mg via INTRAVENOUS
  Filled 2013-04-10: qty 1

## 2013-04-10 MED ORDER — GUAIFENESIN ER 600 MG PO TB12
1200.0000 mg | ORAL_TABLET | Freq: Two times a day (BID) | ORAL | Status: DC
  Administered 2013-04-10 – 2013-04-12 (×6): 1200 mg via ORAL
  Filled 2013-04-10 (×7): qty 2

## 2013-04-10 MED ORDER — ALBUTEROL SULFATE (5 MG/ML) 0.5% IN NEBU: 2.5000 mg | INHALATION_SOLUTION | Freq: Four times a day (QID) | RESPIRATORY_TRACT | Status: DC

## 2013-04-10 MED ORDER — LISINOPRIL-HYDROCHLOROTHIAZIDE 20-25 MG PO TABS: 1.0000 | ORAL_TABLET | Freq: Every day | ORAL | Status: DC

## 2013-04-10 MED ORDER — ALPRAZOLAM 0.5 MG PO TABS: 0.5000 mg | ORAL_TABLET | Freq: Every evening | ORAL | Status: DC | PRN

## 2013-04-10 MED ORDER — ALBUTEROL SULFATE (5 MG/ML) 0.5% IN NEBU
5.0000 mg | INHALATION_SOLUTION | RESPIRATORY_TRACT | Status: DC
  Administered 2013-04-10 (×3): 5 mg via RESPIRATORY_TRACT
  Filled 2013-04-10: qty 1
  Filled 2013-04-10 (×3): qty 0.5

## 2013-04-10 MED ORDER — METFORMIN HCL 500 MG PO TABS
500.0000 mg | ORAL_TABLET | Freq: Two times a day (BID) | ORAL | Status: DC
  Administered 2013-04-10 – 2013-04-11 (×3): 500 mg via ORAL
  Filled 2013-04-10 (×5): qty 1

## 2013-04-10 MED ORDER — ONDANSETRON HCL 4 MG PO TABS: 4.0000 mg | ORAL_TABLET | Freq: Four times a day (QID) | ORAL | Status: DC | PRN

## 2013-04-10 MED ORDER — METHYLPREDNISOLONE SODIUM SUCC 125 MG IJ SOLR
60.0000 mg | Freq: Every day | INTRAMUSCULAR | Status: DC
  Filled 2013-04-10: qty 0.96

## 2013-04-10 MED ORDER — INSULIN ASPART 100 UNIT/ML ~~LOC~~ SOLN: 0.0000 [IU] | Freq: Three times a day (TID) | SUBCUTANEOUS | Status: DC

## 2013-04-10 MED ORDER — ENOXAPARIN SODIUM 40 MG/0.4ML ~~LOC~~ SOLN
40.0000 mg | SUBCUTANEOUS | Status: DC
  Administered 2013-04-10 – 2013-04-12 (×3): 40 mg via SUBCUTANEOUS
  Filled 2013-04-10 (×3): qty 0.4

## 2013-04-10 MED ORDER — METHYLPREDNISOLONE SODIUM SUCC 125 MG IJ SOLR
125.0000 mg | Freq: Once | INTRAMUSCULAR | Status: AC
  Administered 2013-04-10: 125 mg via INTRAVENOUS
  Filled 2013-04-10: qty 2

## 2013-04-10 MED ORDER — ALBUTEROL SULFATE (5 MG/ML) 0.5% IN NEBU: 5.0000 mg | INHALATION_SOLUTION | RESPIRATORY_TRACT | Status: DC | PRN

## 2013-04-10 NOTE — H&P (Signed)
[facility] History and Physical  KYNZLEY DOWSON ZOX:[id number] DOB: [birth year] DOA: 04/09/2013  Referring physician: ER physician. PCP: No primary provider on file.   Chief Complaint: Shortness of breath.  HPI: Victoria Bird is a 67 y.o. female with known history of COPD, hypertension, diabetes mellitus type 2 presented with complaints of increasing shortness of breath over the last 3 days. Patient also has been having productive cough but denies any fever chills. Denies any chest pain nausea vomiting abdominal pain. Patient's shortness of breath is present even at rest. In the ER patient was found to be wheezing and was started on IV steroids and nebulizer and has been admitted for COPD exacerbation.  Review of Systems: As presented in the history of presenting illness, rest negative.  Past Medical History  Diagnosis Date  . Diabetes mellitus   . Hypertension   . COPD (chronic obstructive pulmonary disease)   . Bronchiectasis   . Chronic pain   . Anxiety   . Tracheobronchitis 01/01/2012   Past Surgical History  Procedure Laterality Date  . Appendectomy    . Cholecystectomy    . Abdominal hysterectomy    . Joint replacement    . Fracture surgery     Social History:  reports that she has been smoking Cigarettes.  She has a 39 pack-year smoking history. She has never used smokeless tobacco. She reports that she does not drink alcohol or use illicit drugs. Home. where does patient live-- Can do ADLs. Can patient participate in ADLs?  Allergies  Allergen Reactions  . Aspartame And Phenylalanine Nausea And Vomiting    Patient says she is allergic to all artificial sweeteners  . Codeine Itching  . Penicillins Other (See Comments)    unknown  . Prednisone Other (See Comments)    Unknown   . Sulfa Antibiotics Other (See Comments)    Kidney problem     Family History  Problem Relation Age of Onset  . Heart attack Mother       Prior to Admission medications    Medication Sig Start Date End Date Taking? Authorizing Provider  albuterol (PROVENTIL HFA;VENTOLIN HFA) 108 (90 BASE) MCG/ACT inhaler Inhale 2 puffs into the lungs every 6 (six) hours as needed. For shortness of breath. 12/18/11  Yes Leroy Sea, MD  ALPRAZolam Prudy Feeler) 0.5 MG tablet Take 0.5 mg by mouth at bedtime as needed for sleep (sleep).   Yes Historical Provider, MD  budesonide-formoterol (SYMBICORT) 160-4.5 MCG/ACT inhaler Inhale 2 puffs into the lungs 2 (two) times daily. 01/01/12  Yes Rodolph Bong, MD  lisinopril-hydrochlorothiazide (PRINZIDE,ZESTORETIC) 20-25 MG per tablet Take 1 tablet by mouth daily.   Yes Historical Provider, MD  metFORMIN (GLUCOPHAGE) 500 MG tablet Take 500 mg by mouth 2 (two) times daily with a meal.    Yes Historical Provider, MD  tiotropium (SPIRIVA) 18 MCG inhalation capsule Place 1 capsule (18 mcg total) into inhaler and inhale daily. 01/01/12  Yes Rodolph Bong, MD   Physical Exam: Filed Vitals:   04/09/13 2332 04/10/13 0026 04/10/13 0130 04/10/13 0311  BP:   137/45 123/57  Pulse:   105 93  Temp:      TempSrc:      Resp:   27 24  SpO2: 99% 92% 89% 93%     General:  Well-developed and nourished.  Eyes: Anicteric no pallor.  ENT: No discharge from ears eyes nose mouth.  Neck: No mass felt.  Cardiovascular: S1-S2 heard.  Respiratory: Bilateral expiratory wheeze  heard no crepitations.  Abdomen: Soft nontender bowel sounds present.  Skin: No rash.  Musculoskeletal: No edema.  Psychiatric: Appears normal.  Neurologic: Alert awake oriented to time place and person. Moves all extremities.  Labs on Admission:  Basic Metabolic Panel:  Recent Labs Lab 04/09/13 2333  NA 137  K 4.3  CL 101  CO2 27  GLUCOSE 122*  BUN 14  CREATININE 0.93  CALCIUM 9.2   Liver Function Tests: No results found for this basename: AST, ALT, ALKPHOS, BILITOT, PROT, ALBUMIN,  in the last 168 hours No results found for this basename: LIPASE, AMYLASE,   in the last 168 hours No results found for this basename: AMMONIA,  in the last 168 hours CBC:  Recent Labs Lab 04/09/13 2333  WBC 7.7  HGB 16.0*  HCT 47.2*  MCV 86.6  PLT 133*   Cardiac Enzymes: No results found for this basename: CKTOTAL, CKMB, CKMBINDEX, TROPONINI,  in the last 168 hours  BNP (last 3 results) No results found for this basename: PROBNP,  in the last 8760 hours CBG: No results found for this basename: GLUCAP,  in the last 168 hours  Radiological Exams on Admission: Dg Chest 2 View  04/09/2013   *RADIOLOGY REPORT*  Clinical Data: Chest pain, shortness of breath, cough.  CHEST - 2 VIEW  Comparison: 01/07/2013  Findings: There is hyperinflation of the lungs compatible with COPD.  Peribronchial thickening.  Heart is normal size.  Mild chronic interstitial prominence.  No confluent opacities or effusions.  No acute bony abnormality.  IMPRESSION: COPD/chronic changes.  No acute findings.   Original Report Authenticated By: Charlett Nose, M.D.    EKG: Independently reviewed. Sinus tachycardia.  Assessment/Plan Principal Problem:   COPD exacerbation Active Problems:   Diabetes mellitus   Hypertension   1. COPD exacerbation - continue with nebulizer Pulmicort and have placed patient on Levaquin. Patient cannot tolerate prednisone but okay with IV Solu-Medrol. 2. Diabetes mellitus type 2 - patient has been placed on sliding-scale coverage. Since patient is on status closely follow CBGs. Patient has specifically advised not to place her on diabetic diet. 3. Hypertension - continue home medications. 4. Tobacco cessation counseling requested.    Code Status: Full code.  Family Communication: None.  Disposition Plan: Admit to inpatient.    Anniemae Haberkorn N. [facility] Pager [phone].  If 7PM-7AM, please contact night-coverage [email or web address] Password [email or web address] 04/10/2013, 4:06 AM

## 2013-04-10 NOTE — Progress Notes (Signed)
INITIAL NUTRITION ASSESSMENT  DOCUMENTATION CODES Per approved criteria  -Not Applicable   INTERVENTION: - Ensure Complete BID - Assisted pt with ordering meals. Encouraged small frequent meals.  - Will continue to monitor  NUTRITION DIAGNOSIS: Unintended weight loss related to poor appetite, pain when eating large meals as evidenced by pt report.   Goal: Pt to consume >90% of meals/supplements  Monitor:  Weights, labs, intake  Reason for Assessment: Nutrition risk   68 y.o. female  Admitting Dx: COPD exacerbation  ASSESSMENT: Pt c/o having no appetite for a long time. States she eats only 1 meal/day and snacks throughout the rest of the day. Says if she eats a regular sized meal she gets pain in her abdomen and ribs, so she has to eat small frequent meals. Pt reports weighing 218 pounds 1 year ago, now weighs 156 pounds, however weight trend shows pt was weighing only as much as 174 pounds last year. Pt reports history of sometimes choking on food PTA but states she has been tested for this issue in the past and was told they are unsure what is causing this. Assisted pt with ordering lunch.  Height: Ht Readings from Last 1 Encounters:  04/10/13 5\' 3"  (1.6 m)    Weight: Wt Readings from Last 1 Encounters:  04/10/13 156 lb 12 oz (71.1 kg)    Ideal Body Weight: 115 lb  % Ideal Body Weight: 136%  Wt Readings from Last 10 Encounters:  04/10/13 156 lb 12 oz (71.1 kg)  01/08/13 162 lb 6.4 oz (73.664 kg)  12/29/11 174 lb 4.8 oz (79.062 kg)  12/14/11 165 lb 14.4 oz (75.252 kg)    Usual Body Weight: 218 lb last year per pt  % Usual Body Weight: 71%  BMI:  Body mass index is 27.77 kg/(m^2).  Estimated Nutritional Needs: Kcal: 1450-1750 Protein: 70-85g Fluid: 1.4-1.7L/day  Skin: Intact   Diet Order: General  EDUCATION NEEDS: -No education needs identified at this time   Intake/Output Summary (Last 24 hours) at 04/10/13 1232 Last data filed at 04/10/13 0929  Gross per 24 hour  Intake    240 ml  Output      0 ml  Net    240 ml    Last BM: 7/9  Labs:   Recent Labs Lab 04/09/13 2333 04/10/13 0630  NA 137 136  K 4.3 3.9  CL 101 102  CO2 27 24  BUN 14 18  CREATININE 0.93 0.95  CALCIUM 9.2 8.9  GLUCOSE 122* 185*    CBG (last 3)   Recent Labs  04/10/13 0635  GLUCAP 168*    Scheduled Meds: . budesonide (PULMICORT) nebulizer solution  0.25 mg Nebulization BID  . enoxaparin (LOVENOX) injection  40 mg Subcutaneous Q24H  . guaiFENesin  1,200 mg Oral BID  . hydrochlorothiazide  25 mg Oral Daily  . levofloxacin (LEVAQUIN) IV  750 mg Intravenous Q0600  . lisinopril  20 mg Oral Daily  . metFORMIN  500 mg Oral BID WC  . [START ON 04/11/2013] methylPREDNISolone (SOLU-MEDROL) injection  60 mg Intravenous Daily  . sodium chloride  3 mL Intravenous Q12H  . sodium chloride  3 mL Intravenous Q12H    Continuous Infusions:   Past Medical History  Diagnosis Date  . Diabetes mellitus   . Hypertension   . COPD (chronic obstructive pulmonary disease)   . Bronchiectasis   . Chronic pain   . Anxiety   . Tracheobronchitis 01/01/2012    Past Surgical History  Procedure Laterality Date  . Appendectomy    . Cholecystectomy    . Abdominal hysterectomy    . Joint replacement    . Fracture surgery       Levon Hedger MS, RD, LDN [phone] Pager [phone] After Hours Pager

## 2013-04-10 NOTE — Progress Notes (Signed)
ANTIBIOTIC CONSULT NOTE - INITIAL  Pharmacy Consult for levofloxacin Indication: rule out pneumonia  Allergies  Allergen Reactions  . Aspartame And Phenylalanine Nausea And Vomiting    Patient says she is allergic to all artificial sweeteners  . Codeine Itching  . Penicillins Other (See Comments)    unknown  . Prednisone Other (See Comments)    Unknown   . Sulfa Antibiotics Other (See Comments)    Kidney problem     Patient Measurements:   Adjusted Body Weight:   Vital Signs: Temp: 99.3 F (37.4 C) (07/09 2233) Temp src: Oral (07/09 2233) BP: 113/48 mmHg (07/10 0500) Pulse Rate: 86 (07/10 0500) Intake/Output from previous day:   Intake/Output from this shift:    Labs:  Recent Labs  04/09/13 2333  WBC 7.7  HGB 16.0*  PLT 133*  CREATININE 0.93   The CrCl is unknown because both a height and weight (above a minimum accepted value) are required for this calculation. No results found for this basename: VANCOTROUGH, VANCOPEAK, VANCORANDOM, GENTTROUGH, GENTPEAK, GENTRANDOM, TOBRATROUGH, TOBRAPEAK, TOBRARND, AMIKACINPEAK, AMIKACINTROU, AMIKACIN,  in the last 72 hours   Microbiology: No results found for this or any previous visit (from the past 720 hour(s)).  Medical History: Past Medical History  Diagnosis Date  . Diabetes mellitus   . Hypertension   . COPD (chronic obstructive pulmonary disease)   . Bronchiectasis   . Chronic pain   . Anxiety   . Tracheobronchitis 01/01/2012    Medications:  Anti-infectives   Start     Dose/Rate Route Frequency Ordered Stop   04/10/13 0615  levofloxacin (LEVAQUIN) IVPB 750 mg     750 mg 100 mL/hr over 90 Minutes Intravenous Daily 04/10/13 0613       Assessment: Patient with r/o PNA, copd.  Goal of Therapy:  Levofloxacin dosed based on patient weight and renal function     Plan: Levofloxacin 750mg  iv q24hr Follow up culture results  Aleene Davidson Crowford 04/10/2013,6:16 AM

## 2013-04-10 NOTE — ED Notes (Addendum)
Report called to floor RN, Leotis Shames

## 2013-04-10 NOTE — ED Provider Notes (Signed)
History    CSN: [id number] Arrival date & time 04/09/13  2222  First MD Initiated Contact with Patient 04/09/13 2322     Chief Complaint  Patient presents with  . Shortness of Breath  . Cough   HPI patient with history of COPD who continues to smoke cigarettes presents with cough, rib pain, cough is occasionally productive of white to yellow sputum. No hemoptysis. Patient also feels short of breath. Patient's symptoms started Sunday it continued she says she has gotten worse. Subjective fevers. Patient has rib pain underneath the right breast. Patient is occasionally dizzy after coughing and has had post tussive nausea. No history of PE or DVT, no recent immobilization, no history of cancer, no trauma recently.  Past Medical History  Diagnosis Date  . Diabetes mellitus   . Hypertension   . COPD (chronic obstructive pulmonary disease)   . Bronchiectasis   . Chronic pain   . Anxiety   . Tracheobronchitis 01/01/2012   Past Surgical History  Procedure Laterality Date  . Appendectomy    . Cholecystectomy    . Abdominal hysterectomy    . Joint replacement    . Fracture surgery     Family History  Problem Relation Age of Onset  . Heart attack Mother    History  Substance Use Topics  . Smoking status: Current Every Day Smoker -- 1.00 packs/day for 39 years    Types: Cigarettes  . Smokeless tobacco: Never Used  . Alcohol Use: No   OB History   Grav Para Term Preterm Abortions TAB SAB Ect Mult Living                 Review of Systems At least 10pt or greater review of systems completed and are negative except where specified in the HPI.  Allergies  Aspartame and phenylalanine; Codeine; Penicillins; Prednisone; and Sulfa antibiotics  Home Medications   Current Outpatient Rx  Name  Route  Sig  Dispense  Refill  . albuterol (PROVENTIL HFA;VENTOLIN HFA) 108 (90 BASE) MCG/ACT inhaler   Inhalation   Inhale 2 puffs into the lungs every 6 (six) hours as needed. For shortness  of breath.   1 Inhaler   1   . ALPRAZolam (XANAX) 0.5 MG tablet   Oral   Take 0.5 mg by mouth at bedtime as needed for sleep (sleep).         . budesonide-formoterol (SYMBICORT) 160-4.5 MCG/ACT inhaler   Inhalation   Inhale 2 puffs into the lungs 2 (two) times daily.   1 Inhaler   0   . lisinopril-hydrochlorothiazide (PRINZIDE,ZESTORETIC) 20-25 MG per tablet   Oral   Take 1 tablet by mouth daily.         . metFORMIN (GLUCOPHAGE) 500 MG tablet   Oral   Take 500 mg by mouth 2 (two) times daily with a meal.          . tiotropium (SPIRIVA) 18 MCG inhalation capsule   Inhalation   Place 1 capsule (18 mcg total) into inhaler and inhale daily.   30 capsule   0    BP 148/68  Pulse 98  Temp(Src) 99.3 F (37.4 C) (Oral)  Resp 24  SpO2 99% Physical Exam  Nursing notes reviewed.  Electronic medical record reviewed. VITAL SIGNS:   Filed Vitals:   04/10/13 1405 04/10/13 2008 04/10/13 2118 04/11/13 0501  BP: 123/59  148/72 99/37  Pulse: 81  98 88  Temp: 98.1 F (36.7 C)  98 F (  36.7 C) 98 F (36.7 C)  TempSrc: Oral  Oral Oral  Resp: 24  26 22   Height:      Weight:      SpO2: 95% 92% 95% 95%   CONSTITUTIONAL: Awake, oriented, appears non-toxic, appears tired and ill HENT: Atraumatic, normocephalic, oral mucosa pink and moist, airway patent. Nares patent without drainage. External ears normal. EYES: Conjunctiva clear, EOMI, PERRLA NECK: Trachea midline, non-tender, supple CARDIOVASCULAR: Normal heart rate, Normal rhythm, No murmurs, rubs, gallops PULMONARY/CHEST: Tachypneic on exam, diminished breath sounds bilaterally with wheezing bilaterally. Non-tender. ABDOMINAL: Non-distended, soft, non-tender - no rebound or guarding.  BS normal. NEUROLOGIC: Non-focal, moving all four extremities, no gross sensory or motor deficits. EXTREMITIES: No clubbing, cyanosis, or edema SKIN: Warm, Dry, No erythema, No rash  ED Course  Procedures (including critical care time)   Date: 04/11/2013  Rate: 104  Rhythm: Sinus tach  QRS Axis: normal  Intervals: normal  ST/T Wave abnormalities: normal  Conduction Disutrbances: none  Narrative Interpretation: Sinus tachycardia   Labs Reviewed  CBC - Abnormal; Notable for the following:    RBC 5.45 (*)    Hemoglobin 16.0 (*)    HCT 47.2 (*)    Platelets 133 (*)    All other components within normal limits  BASIC METABOLIC PANEL  POCT I-STAT TROPONIN I   Dg Chest 2 View  04/09/2013   *RADIOLOGY REPORT*  Clinical Data: Chest pain, shortness of breath, cough.  CHEST - 2 VIEW  Comparison: 01/07/2013  Findings: There is hyperinflation of the lungs compatible with COPD.  Peribronchial thickening.  Heart is normal size.  Mild chronic interstitial prominence.  No confluent opacities or effusions.  No acute bony abnormality.  IMPRESSION: COPD/chronic changes.  No acute findings.   Original Report Authenticated By: Charlett Nose, M.D.   1. COPD exacerbation   2. Diabetes mellitus   3. Hypertension   4. Acute respiratory failure with hypoxia   5. CAP (community acquired pneumonia)     MDM  Patient presenting with likely COPD exacerbation, no indications for pneumonia on chest x-ray. Patient treated symptomatically in the emergency department with steroids, multiple breathing treatments. Patient has required oxygen in the emergency department to maintain saturations from 93-95%, on ambulation without oxygen, the patient drops her oxygen saturations into the high 80s. Will admit patient for a COPD exacerbation, do not think she needs antibiotics at this time. Patient has a Wells score of 1.5 her tachycardia and therefore low risk for PE, do not think she's got acute coronary syndrome or pulmonary embolism or any other acute intrathoracic emergency at this time. Discussed with Dr. Toniann Fail for admission.  Jones Skene, MD 04/11/13 [phone]

## 2013-04-10 NOTE — Progress Notes (Signed)
[facility] PROGRESS NOTE  Victoria Bird ZOX:[id number] DOB: 07-22-1946 DOA: 04/09/2013 PCP: No primary provider on file.  Brief narrative: Addendum to admission note 67 y.o. female with past medical history of COPD, hypertension, diabetes mellitus type 2 who presented to [facility] ED with worsening shortness of breath over the past few days prior to this admission associated with fever and chills as well as productive cough.  In ED, her O2 saturation was 85% while breathing ambient air. BP was 111/41 and HR 77 - 105, T 98.4 F. Her CXR revealed chronic COPD changes. CBC revealed platelet count of 133. BMP was essentially unremarkable.  Assessment/Plan:  Principal Problem:   Acute respiratory failure with hypoxia - likely due to acute COPD exacerbation and pneumonia - manage COPD with pulmicort nebulizer BID and albuterol and atrovent every 4 hours PRN shortness of breath or wheezing - solumedrol 60 mg IV daily - Levaquin for possible pneumonia - oxygen support via nasal canula to keep O2 saturation above 90% - COPD order set in place   Acute COPD exacerbation - management as above with nebulizer treatments, steroids and antibiotic   CAP (community acquired pneumonia) - continue Levaquin started in ED - please note that the blood cultures were not obtained at the time of the admission to guide the antibiotic treatment regimen. Since pt is already on Levaquin we will obtain blood cultures only if pt spikes fever  Active Problems:   Diabetes mellitus - pt does not want insulin sliding scale and wants to be put on metformin; I have stopped sliding scale and started metformin home dosage 500 mg BID   Hypertension   - continue Hctz and Lisniopril     Code Status: full code Family Communication: no family at the bedside Disposition Plan: home when stable  Manson Passey, MD  [facility] Pager [phone]  If 7PM-7AM, please contact night-coverage [email or web address] Password [facility] 04/10/2013, 5:31  PM   LOS: 1 day   Consultants:  None   Procedures:  None   Antibiotics:  Levaquin 04/10/2013 -->  HPI/Subjective: Feels short of breath but able to complete full sentences.  Objective: Filed Vitals:   04/10/13 0500 04/10/13 0613 04/10/13 0914 04/10/13 1405  BP: 113/48 111/41  123/59  Pulse: 86 77  81  Temp:  98.4 F (36.9 C)  98.1 F (36.7 C)  TempSrc:  Oral  Oral  Resp: 28 24  24   Height:  5\' 3"  (1.6 m)    Weight:  71.1 kg (156 lb 12 oz)    SpO2: 93% 100% 93% 95%    Intake/Output Summary (Last 24 hours) at 04/10/13 1731 Last data filed at 04/10/13 1255  Gross per 24 hour  Intake    480 ml  Output      0 ml  Net    480 ml    Exam:   General:  Pt is alert, follows commands appropriately, not in acute distress  Cardiovascular: Regular rate and rhythm, S1/S2, no murmurs, no rubs, no gallops  Respiratory: wheezing in upper lung lobes, no crackles  Abdomen: Soft, non tender, non distended, bowel sounds present, no guarding  Extremities: No edema, pulses DP and PT palpable bilaterally  Neuro: Grossly nonfocal  Data Reviewed: Basic Metabolic Panel:  Recent Labs Lab 04/09/13 2333 04/10/13 0630  NA 137 136  K 4.3 3.9  CL 101 102  CO2 27 24  GLUCOSE 122* 185*  BUN 14 18  CREATININE 0.93 0.95  CALCIUM 9.2 8.9   Liver Function  Tests: No results found for this basename: AST, ALT, ALKPHOS, BILITOT, PROT, ALBUMIN,  in the last 168 hours No results found for this basename: LIPASE, AMYLASE,  in the last 168 hours No results found for this basename: AMMONIA,  in the last 168 hours CBC:  Recent Labs Lab 04/09/13 2333 04/10/13 0630  WBC 7.7 7.1  HGB 16.0* 14.9  HCT 47.2* 44.9  MCV 86.6 86.8  PLT 133* 139*   Cardiac Enzymes: No results found for this basename: CKTOTAL, CKMB, CKMBINDEX, TROPONINI,  in the last 168 hours BNP: No components found with this basename: POCBNP,  CBG:  Recent Labs Lab 04/10/13 0635 04/10/13 1151 04/10/13 1655   GLUCAP 168* 204* 167*    No results found for this or any previous visit (from the past 240 hour(s)).   Studies: Dg Chest 2 View  04/09/2013   *RADIOLOGY REPORT*  Clinical Data: Chest pain, shortness of breath, cough.  CHEST - 2 VIEW  Comparison: 01/07/2013  Findings: There is hyperinflation of the lungs compatible with COPD.  Peribronchial thickening.  Heart is normal size.  Mild chronic interstitial prominence.  No confluent opacities or effusions.  No acute bony abnormality.  IMPRESSION: COPD/chronic changes.  No acute findings.   Original Report Authenticated By: Charlett Nose, M.D.    Scheduled Meds: . budesonide (PULMICORT) nebulizer solution  0.25 mg Nebulization BID  . enoxaparin (LOVENOX) injection  40 mg Subcutaneous Q24H  . feeding supplement  237 mL Oral BID BM  . guaiFENesin  1,200 mg Oral BID  . hydrochlorothiazide  25 mg Oral Daily  . levofloxacin (LEVAQUIN) IV  750 mg Intravenous Q0600  . lisinopril  20 mg Oral Daily  . metFORMIN  500 mg Oral BID WC  . [START ON 04/11/2013] methylPREDNISolone (SOLU-MEDROL) injection  60 mg Intravenous Daily  . sodium chloride  3 mL Intravenous Q12H  . sodium chloride  3 mL Intravenous Q12H   Continuous Infusions:

## 2013-04-11 DIAGNOSIS — M7989 Other specified soft tissue disorders: Secondary | ICD-10-CM

## 2013-04-11 LAB — BASIC METABOLIC PANEL
CO2: 28 mEq/L (ref 19–32)
Chloride: 99 mEq/L (ref 96–112)
GFR calc Af Amer: 51 mL/min — ABNORMAL LOW (ref >90)
Potassium: 4.1 mEq/L (ref 3.5–5.1)

## 2013-04-11 LAB — CBC
HCT: 44.2 % (ref 36.0–46.0)
Hemoglobin: 14.6 g/dL (ref 12.0–15.0)
MCV: 88 fL (ref 78.0–100.0)
RBC: 5.02 MIL/uL (ref 3.87–5.11)
RDW: 13.8 % (ref 11.5–15.5)
WBC: 11.3 10*3/uL — ABNORMAL HIGH (ref 4.0–10.5)

## 2013-04-11 LAB — GLUCOSE, CAPILLARY
Glucose-Capillary: 116 mg/dL — ABNORMAL HIGH (ref 70–99)
Glucose-Capillary: 150 mg/dL — ABNORMAL HIGH (ref 70–99)

## 2013-04-11 LAB — PRO B NATRIURETIC PEPTIDE: Pro B Natriuretic peptide (BNP): 238.5 pg/mL — ABNORMAL HIGH (ref 0–125)

## 2013-04-11 MED ORDER — ACETAMINOPHEN 500 MG PO TABS
1000.0000 mg | ORAL_TABLET | Freq: Three times a day (TID) | ORAL | Status: AC
  Administered 2013-04-11 – 2013-04-12 (×2): 1000 mg via ORAL
  Filled 2013-04-11 (×2): qty 2

## 2013-04-11 MED ORDER — ALPRAZOLAM 0.5 MG PO TABS
0.5000 mg | ORAL_TABLET | Freq: Two times a day (BID) | ORAL | Status: DC | PRN
  Administered 2013-04-11 – 2013-04-12 (×3): 0.5 mg via ORAL
  Filled 2013-04-11 (×3): qty 1

## 2013-04-11 MED ORDER — METHYLPREDNISOLONE SODIUM SUCC 125 MG IJ SOLR
60.0000 mg | Freq: Two times a day (BID) | INTRAMUSCULAR | Status: DC
  Administered 2013-04-11 – 2013-04-12 (×3): 60 mg via INTRAVENOUS
  Filled 2013-04-11 (×4): qty 0.96

## 2013-04-11 MED ORDER — IPRATROPIUM BROMIDE 0.02 % IN SOLN
0.5000 mg | RESPIRATORY_TRACT | Status: DC
  Administered 2013-04-11 – 2013-04-12 (×3): 0.5 mg via RESPIRATORY_TRACT
  Filled 2013-04-11 (×3): qty 2.5

## 2013-04-11 MED ORDER — SODIUM CHLORIDE 0.9 % IV SOLN
INTRAVENOUS | Status: DC
  Administered 2013-04-11 – 2013-04-12 (×2): via INTRAVENOUS

## 2013-04-11 MED ORDER — HYDROCOD POLST-CHLORPHEN POLST 10-8 MG/5ML PO LQCR
5.0000 mL | Freq: Two times a day (BID) | ORAL | Status: DC | PRN
  Administered 2013-04-12: 5 mL via ORAL
  Filled 2013-04-11: qty 5

## 2013-04-11 MED ORDER — INSULIN ASPART 100 UNIT/ML ~~LOC~~ SOLN: 0.0000 [IU] | Freq: Three times a day (TID) | SUBCUTANEOUS | Status: DC

## 2013-04-11 NOTE — Progress Notes (Signed)
CSW received consult that patient is on COPD Gold Protol - though per charge RN, Ashok Cordia - that is an inappropriate consult as patient has only been admitted to the hospital 2 times in 6 months and does not meet criteria for COPD Gold Protocol. CSW signing off.   Unice Bailey, LCSW [facility] Clinical Social Worker cell #: [phone]

## 2013-04-11 NOTE — Progress Notes (Signed)
OT Cancellation Note  Patient Details Name: Victoria Bird MRN: [id number] DOB: 1946-08-28   Cancelled Treatment:    Reason Eval/Treat Not Completed: Other (comment)  Have checked on pt twice and she is asleep.   Pt isn't feeling well and may do better tomorrow per nursing.   Hasn't been sleeping well so I won't wake her at this time. Pt tolerated a little with PT earlier.    [name] 04/11/2013, 12:42 PM Marica Otter, OTR/L [phone] 04/11/2013

## 2013-04-11 NOTE — Evaluation (Signed)
Physical Therapy Evaluation Patient Details Name: Victoria Bird MRN: [id number] DOB: [birth year] Today's Date: 04/11/2013 Time: 7829-5621 PT Time Calculation (min): 12 min  PT Assessment / Plan / Recommendation History of Present Illness  67 y.o. female with past medical history of COPD, hypertension, diabetes mellitus type 2 who presented to [facility] ED with worsening shortness of breath over the past few days prior to this admission associated with fever and chills as well as productive cough.    Clinical Impression  Limited eval due to pt not feeling well. Pt eventually agreeable to ambulate in room with therapist. Min guard-Min assist for mobility-ambulated ~15 feet without device but pt reaching out for objects in environment to hold onto. Will follow to hopefully better assess pt's mobility on next session.     PT Assessment  Patient needs continued PT services    Follow Up Recommendations  No PT follow up vs Home health PT (depending on progress)    Does the patient have the potential to tolerate intense rehabilitation      Barriers to Discharge        Equipment Recommendations  None recommended by PT    Recommendations for Other Services OT consult   Frequency Min 3X/week    Precautions / Restrictions Precautions Precautions: Fall Restrictions Weight Bearing Restrictions: No   Pertinent Vitals/Pain "all over"-unrated generalized pain from excessive coughing per pt      Mobility  Bed Mobility Bed Mobility: Supine to Sit Supine to Sit: 6: Modified independent (Device/Increase time) Transfers Transfers: Sit to Stand;Stand to Sit Sit to Stand: 4: Min guard;From bed Stand to Sit: 4: Min guard;To bed Ambulation/Gait Ambulation/Gait Assistance: 4: Min guard;4: Min Environmental consultant (Feet): 15 Feet Assistive device: None Ambulation/Gait Assistance Details: pt reaching out for objects/envirnoment to hold onto. fatigues easily.  Gait Pattern: Step-through  pattern;Decreased stride length    Exercises     PT Diagnosis: Difficulty walking;Generalized weakness  PT Problem List: Decreased strength;Decreased activity tolerance;Decreased mobility;Decreased balance;Pain PT Treatment Interventions: Gait training;Stair training;Functional mobility training;Therapeutic activities;Therapeutic exercise;Balance training;Patient/family education     PT Goals(Current goals can be found in the care plan section) Acute Rehab PT Goals Patient Stated Goal: feel better.  PT Goal Formulation: With patient Time For Goal Achievement: 04/25/13 Potential to Achieve Goals: Good  Visit Information  Last PT Received On: 04/11/13 Assistance Needed: +1 History of Present Illness: 67 y.o. female with past medical history of COPD, hypertension, diabetes mellitus type 2 who presented to [facility] ED with worsening shortness of breath over the past few days prior to this admission associated with fever and chills as well as productive cough.         Prior Functioning  Home Living Family/patient expects to be discharged to:: Private residence Living Arrangements: Children Type of Home:  (mobile home) Home Access: Stairs to enter Entrance Stairs-Rails: Right Home Layout: One level Home Equipment: None    Cognition  Cognition Arousal/Alertness: Awake/alert Behavior During Therapy: WFL for tasks assessed/performed Overall Cognitive Status: Within Functional Limits for tasks assessed    Extremity/Trunk Assessment Upper Extremity Assessment Upper Extremity Assessment: Defer to OT evaluation Lower Extremity Assessment Lower Extremity Assessment: Generalized weakness Cervical / Trunk Assessment Cervical / Trunk Assessment: Normal   Balance    End of Session PT - End of Session Activity Tolerance: Patient limited by fatigue Patient left: in bed;with call bell/phone within reach  GP     Rebeca Alert, MPT Pager: [phone]

## 2013-04-11 NOTE — Progress Notes (Signed)
*  PRELIMINARY RESULTS* Vascular Ultrasound Lower extremity venous duplex has been completed.  Preliminary findings: negative for DVT.   Farrel Demark, RDMS, RVT  04/11/2013, 2:58 PM

## 2013-04-11 NOTE — Progress Notes (Signed)
PATIENT DETAILS Name: Victoria Bird Age: 67 y.o. Sex: female Date of Birth: [birth year] Admit Date: 04/09/2013 Admitting Physician Eduard Clos, MD PCP:No primary provider on file.  Subjective: Claims to have minimal improvement-chest pain worse with coughing  Assessment/Plan: Principal Problem:   Acute respiratory failure with hypoxia -2/2 to Acute exac of COPD -c/w O2, nebs, steroids and empiric Levaquin  Active Problems: Acute exac of COPD -c/w O2, nebs, and empiric Levaquin. Change Solumedrol to BID -Incentive spirometry  Chest Pain -pleuritic and reproducible-suspect from heavy coughing-musculo-skeletal -scheduled tylenol for 1 day -repeated Troponin negative today -Echo-preserved EF  ARF -hold Diuretics and Lisinopril -gently hydrate   Diabetes mellitus -CBG's stable -SSI    Hypertension -hold BP meds and monitor  Disposition: Remain inpatient  DVT Prophylaxis: Prophylactic Lovenox   Code Status: Full code   Family Communication None at bedside  Procedures:  None  CONSULTS:  None   MEDICATIONS: Scheduled Meds: . acetaminophen  1,000 mg Oral TID  . albuterol  2.5 mg Nebulization Q4H  . budesonide (PULMICORT) nebulizer solution  0.25 mg Nebulization BID  . enoxaparin (LOVENOX) injection  40 mg Subcutaneous Q24H  . feeding supplement  237 mL Oral BID BM  . guaiFENesin  1,200 mg Oral BID  . ipratropium  0.5 mg Nebulization Q4H  . levofloxacin (LEVAQUIN) IV  750 mg Intravenous Q0600  . lisinopril  20 mg Oral Daily  . methylPREDNISolone (SOLU-MEDROL) injection  60 mg Intravenous BID  . sodium chloride  3 mL Intravenous Q12H  . sodium chloride  3 mL Intravenous Q12H   Continuous Infusions: . sodium chloride     PRN Meds:.acetaminophen, acetaminophen, albuterol, ALPRAZolam, chlorpheniramine-HYDROcodone, HYDROmorphone (DILAUDID) injection, ondansetron (ZOFRAN) IV, ondansetron  Antibiotics: Anti-infectives   Start     Dose/Rate  Route Frequency Ordered Stop   04/10/13 0615  levofloxacin (LEVAQUIN) IVPB 750 mg     750 mg 100 mL/hr over 90 Minutes Intravenous Daily 04/10/13 0613         PHYSICAL EXAM: Vital signs in last 24 hours: Filed Vitals:   04/10/13 2118 04/11/13 0501 04/11/13 0800 04/11/13 1350  BP: 148/72 99/37 105/62 112/51  Pulse: 98 88 82 88  Temp: 98 F (36.7 C) 98 F (36.7 C) 98.4 F (36.9 C) 97.7 F (36.5 C)  TempSrc: Oral Oral Oral Oral  Resp: 26 22 20 20   Height:      Weight:      SpO2: 95% 95%  92%    Weight change:  Filed Weights   04/10/13 0613  Weight: 71.1 kg (156 lb 12 oz)   Body mass index is 27.77 kg/(m^2).   Gen Exam: Awake and alert with clear speech.   Neck: Supple, No JVD.   Chest: Good air entry bilaterally-rhonchi in all lung zones CVS: S1 S2 Regular, no murmurs.  Abdomen: soft, BS +, non tender, non distended.  Extremities: no edema, lower extremities warm to touch. Neurologic: Non Focal.   Skin: No Rash.   Wounds: N/A.   Intake/Output from previous day:  Intake/Output Summary (Last 24 hours) at 04/11/13 1355 Last data filed at 04/11/13 1300  Gross per 24 hour  Intake    420 ml  Output      0 ml  Net    420 ml     LAB RESULTS: CBC  Recent Labs Lab 04/09/13 2333 04/10/13 0630 04/11/13 1040  WBC 7.7 7.1 11.3*  HGB 16.0* 14.9 14.6  HCT 47.2* 44.9 44.2  PLT 133* 139* 153  MCV 86.6 86.8 88.0  MCH 29.4 28.8 29.1  MCHC 33.9 33.2 33.0  RDW 13.4 13.7 13.8    Chemistries   Recent Labs Lab 04/09/13 2333 04/10/13 0630 04/11/13 1040  NA 137 136 135  K 4.3 3.9 4.1  CL 101 102 99  CO2 27 24 28   GLUCOSE 122* 185* 122*  BUN 14 18 31*  CREATININE 0.93 0.95 1.24*  CALCIUM 9.2 8.9 9.2    CBG:  Recent Labs Lab 04/10/13 0635 04/10/13 1151 04/10/13 1655 04/10/13 2115  GLUCAP 168* 204* 167* 177*    GFR Estimated Creatinine Clearance: 42.2 ml/min (by C-G formula based on Cr of 1.24).  Coagulation profile No results found for this  basename: INR, PROTIME,  in the last 168 hours  Cardiac Enzymes  Recent Labs Lab 04/11/13 1040  TROPONINI <0.30    No components found with this basename: POCBNP,  No results found for this basename: DDIMER,  in the last 72 hours No results found for this basename: HGBA1C,  in the last 72 hours No results found for this basename: CHOL, HDL, LDLCALC, TRIG, CHOLHDL, LDLDIRECT,  in the last 72 hours No results found for this basename: TSH, T4TOTAL, FREET3, T3FREE, THYROIDAB,  in the last 72 hours No results found for this basename: VITAMINB12, FOLATE, FERRITIN, TIBC, IRON, RETICCTPCT,  in the last 72 hours No results found for this basename: LIPASE, AMYLASE,  in the last 72 hours  Urine Studies No results found for this basename: UACOL, UAPR, USPG, UPH, UTP, UGL, UKET, UBIL, UHGB, UNIT, UROB, ULEU, UEPI, UWBC, URBC, UBAC, CAST, CRYS, UCOM, BILUA,  in the last 72 hours  MICROBIOLOGY: No results found for this or any previous visit (from the past 240 hour(s)).  RADIOLOGY STUDIES/RESULTS: Dg Chest 2 View  04/09/2013   *RADIOLOGY REPORT*  Clinical Data: Chest pain, shortness of breath, cough.  CHEST - 2 VIEW  Comparison: 01/07/2013  Findings: There is hyperinflation of the lungs compatible with COPD.  Peribronchial thickening.  Heart is normal size.  Mild chronic interstitial prominence.  No confluent opacities or effusions.  No acute bony abnormality.  IMPRESSION: COPD/chronic changes.  No acute findings.   Original Report Authenticated By: Charlett Nose, M.D.    Jeoffrey Massed, MD  [facility] Pager:[phone]  If 7PM-7AM, please contact night-coverage [email or web address] Password [email or web address] 04/11/2013, 1:55 PM   LOS: 2 days

## 2013-04-12 LAB — GLUCOSE, CAPILLARY
Glucose-Capillary: 165 mg/dL — ABNORMAL HIGH (ref 70–99)
Glucose-Capillary: 225 mg/dL — ABNORMAL HIGH (ref 70–99)

## 2013-04-12 MED ORDER — ALPRAZOLAM 0.5 MG PO TABS: 0.5000 mg | ORAL_TABLET | Freq: Every evening | ORAL | Status: DC | PRN

## 2013-04-12 MED ORDER — TIOTROPIUM BROMIDE MONOHYDRATE 18 MCG IN CAPS: 18.0000 ug | ORAL_CAPSULE | Freq: Every day | RESPIRATORY_TRACT | Status: DC

## 2013-04-12 MED ORDER — BUDESONIDE-FORMOTEROL FUMARATE 160-4.5 MCG/ACT IN AERO: 2.0000 | INHALATION_SPRAY | Freq: Two times a day (BID) | RESPIRATORY_TRACT | Status: DC

## 2013-04-12 MED ORDER — LEVOFLOXACIN 500 MG PO TABS: 500.0000 mg | ORAL_TABLET | Freq: Every day | ORAL | Status: DC

## 2013-04-12 MED ORDER — ALBUTEROL SULFATE (5 MG/ML) 0.5% IN NEBU: 2.5000 mg | INHALATION_SOLUTION | RESPIRATORY_TRACT | Status: DC | PRN

## 2013-04-12 MED ORDER — SODIUM CHLORIDE 0.9 % IV SOLN: INTRAVENOUS | Status: AC

## 2013-04-12 MED ORDER — AMLODIPINE BESYLATE 5 MG PO TABS: 5.0000 mg | ORAL_TABLET | Freq: Every day | ORAL | Status: DC

## 2013-04-12 MED ORDER — GUAIFENESIN ER 600 MG PO TB12: 1200.0000 mg | ORAL_TABLET | Freq: Two times a day (BID) | ORAL | Status: DC | PRN

## 2013-04-12 MED ORDER — LEVOFLOXACIN 750 MG PO TABS: 750.0000 mg | ORAL_TABLET | Freq: Every day | ORAL | Status: DC

## 2013-04-12 MED ORDER — HYDROCODONE-ACETAMINOPHEN 5-325 MG PO TABS: 1.0000 | ORAL_TABLET | Freq: Four times a day (QID) | ORAL | Status: DC | PRN

## 2013-04-12 MED ORDER — IPRATROPIUM BROMIDE 0.02 % IN SOLN: 0.5000 mg | RESPIRATORY_TRACT | Status: DC | PRN

## 2013-04-12 NOTE — Discharge Summary (Signed)
Physician Discharge Summary  Victoria Bird:[id number] DOB: 06-Jun-1946 DOA: 04/09/2013  PCP: No primary provider on file.  Admit date: 04/09/2013 Discharge date: 04/12/2013  Recommendations for Outpatient Follow-up:  1. Continue Levaquin for 5 more days after discharge 2. Hold prinizide as discussed due to acute renal failure. You may continue metformin but please follow up with your primary care physician as soon as possible to recheck kidney function. If it is stable you may continue metformin but it is not improving your PCP may switch you to another medication for diabetes. 3. For you blood pressure we have switched you to Norvasc 5 mg daily. 4. Order was placed for DME nebulizer machine so you can refill prescriptions for nebulizer treatments and use it for shortness of breath or wheezing if inhalers are not providing adequate control. 5. As you have mentioned you are not tolerating PO prednisone and since you have been on IV steroids for less than a week we will not prescribe PO steroids. Your respiratory status is stable therefore continue taking you inhalers for COPD control which includes spiriva as well as inhalers and nebulizer treatments as mentioned above 6. Prescription provided for pain med as needed 7. Please follow up with PCP in about 1 week or sooner from discharge  Discharge Diagnoses:  Principal Problem:   Acute respiratory failure with hypoxia Active Problems:   Diabetes mellitus   Hypertension   COPD exacerbation   CAP (community acquired pneumonia)  Discharge Condition: medically stable for discharge home today  Diet recommendation: as tolerated  History of present illness:  67 y.o. female with past medical history of COPD, hypertension, diabetes mellitus type 2 who presented to [facility] ED with worsening shortness of breath over the past few days prior to this admission associated with fever and chills as well as productive cough.  In ED, her O2 saturation was  85% while breathing ambient air. BP was 111/41 and HR 77 - 105, T 98.4 F. Her CXR revealed chronic COPD changes. CBC revealed platelet count of 133. BMP was essentially unremarkable.   Assessment/Plan:   Principal Problem:  Acute respiratory failure with hypoxia  - likely due to acute COPD exacerbation and pneumonia  - we have managed COPD with pulmicort nebulizer BID and albuterol and atrovent every 4 hours PRN shortness of breath or wheezing  - solumedrol 60 mg IV daily since admission  - Levaquin for possible pneumonia  - oxygen support via nasal canula to keep O2 saturation above 90%  - COPD order set was in place  - on the admission this will be the regimen for COPD management: spiriva daily, albuterol and pulmicort inhalers for shortness of breath as needed per prescription below; if this is not providing significant relief than pt can use nebulizer treatments prn as indicated below - pt does not tolerate PO steroids and it is fine we stop solumedrol since it has been less than 1 week of steroids she does not need PO taper Acute COPD exacerbation  - management as above with nebulizer treatments, steroids and antibiotic Levquin CAP (community acquired pneumonia)  - continue Levaquin for 5 days on discahrge  - please note that the blood cultures were not obtained at the time of the admission to guide the antibiotic treatment regimen. No other episodes of fever while in hospital  Active Problems:  Diabetes mellitus  - pt does not want insulin sliding scale and wants to be put on metformin; I have stopped sliding scale and started metformin  home dosage 500 mg BID which she will continue for now. If her renal function fails to improve than her metformin may be switched to other oral hypoglycemics but this can be done on outpatient follow up. This has been discussed with the pt extensively Hypertension  - discontinue Prinizide due to acute renal failure; substitute for Norvasc 5 mg daily;  this was discussed with the pt expensively and she will follow up with PCP in after discharge to recheck renal function  Code Status: full code  Family Communication: no family at the bedside   Manson Passey, MD  [facility]  Pager [phone]   Consultants:  None  Procedures:  None  Antibiotics:  Levaquin 04/10/2013 --> for 5 more days on discahrge   Discharge Exam: Filed Vitals:   04/12/13 0508  BP: 113/65  Pulse: 90  Temp: 98.2 F (36.8 C)  Resp: 18   Filed Vitals:   04/11/13 0800 04/11/13 1350 04/11/13 2159 04/12/13 0508  BP: 105/62 112/51 111/63 113/65  Pulse: 82 88 89 90  Temp: 98.4 F (36.9 C) 97.7 F (36.5 C) 98 F (36.7 C) 98.2 F (36.8 C)  TempSrc: Oral Oral Oral Oral  Resp: 20 20 19 18   Height:      Weight:      SpO2:  92% 98% 98%    General: Pt is alert, follows commands appropriately, not in acute distress Cardiovascular: Regular rate and rhythm, S1/S2 +, no murmurs, no rubs, no gallops Respiratory: Clear to auscultation bilaterally, no wheezing, no crackles, no rhonchi Abdominal: Soft, non tender, non distended, bowel sounds +, no guarding Extremities: no edema, no cyanosis, pulses palpable bilaterally DP and PT Neuro: Grossly nonfocal  Discharge Instructions  Discharge Orders   Future Orders Complete By Expires     Call MD for:  difficulty breathing, headache or visual disturbances  As directed     Call MD for:  persistant dizziness or light-headedness  As directed     Call MD for:  persistant nausea and vomiting  As directed     Call MD for:  severe uncontrolled pain  As directed     Diet - low sodium heart healthy  As directed     Discharge instructions  As directed     Comments:      1. Continue Levaquin for 5 more days after discharge 2. Hold prinizide as discussed due to acute renal failure. You may continue metformin but please follow up with your primary care physician as soon as possible to recheck kidney function. If it is stable you may  continue metformin but it is not improving your PCP may switch you to another medication for diabetes. 3. For you blood pressure we have switched you to Norvasc 5 mg daily. 4. Order was placed for DME nebulizer machine so you can refill prescriptions for nebulizer treatments and use it for shortness of breath or wheezing if inhalers are not providing adequate control. 5. As you have mentioned you are not tolerating PO prednisone and since you have been on IV steroids for less than a week we will not prescribe PO steroids. Your respiratory status is stable therefore continue taking you inhalers for COPD control which includes spiriva as well as inhalers and nebulizer treatments as mentioned above 5. Prescription provided for pain med as needed 6. Please follow up with PCP in about 1 week or sooner from discharge    Increase activity slowly  As directed  Medication List    STOP taking these medications       lisinopril-hydrochlorothiazide 20-25 MG per tablet  Commonly known as:  PRINZIDE,ZESTORETIC      TAKE these medications       albuterol 108 (90 BASE) MCG/ACT inhaler  Commonly known as:  PROVENTIL HFA;VENTOLIN HFA  Inhale 2 puffs into the lungs every 6 (six) hours as needed. For shortness of breath.     albuterol (5 MG/ML) 0.5% nebulizer solution  Commonly known as:  PROVENTIL  Take 0.5 mLs (2.5 mg total) by nebulization every 4 (four) hours as needed for wheezing or shortness of breath.     ALPRAZolam 0.5 MG tablet  Commonly known as:  XANAX  Take 1 tablet (0.5 mg total) by mouth at bedtime as needed for sleep (sleep).     amLODipine 5 MG tablet  Commonly known as:  NORVASC  Take 1 tablet (5 mg total) by mouth daily.     budesonide-formoterol 160-4.5 MCG/ACT inhaler  Commonly known as:  SYMBICORT  Inhale 2 puffs into the lungs 2 (two) times daily.     guaiFENesin 600 MG 12 hr tablet  Commonly known as:  MUCINEX  Take 2 tablets (1,200 mg total) by mouth 2 (two)  times daily as needed for congestion.     HYDROcodone-acetaminophen 5-325 MG per tablet  Commonly known as:  NORCO  Take 1 tablet by mouth every 6 (six) hours as needed for pain.     ipratropium 0.02 % nebulizer solution  Commonly known as:  ATROVENT  Take 2.5 mLs (0.5 mg total) by nebulization every 4 (four) hours as needed for wheezing.     levofloxacin 500 MG tablet  Commonly known as:  LEVAQUIN  Take 1 tablet (500 mg total) by mouth daily.     metFORMIN 500 MG tablet  Commonly known as:  GLUCOPHAGE  Take 500 mg by mouth 2 (two) times daily with a meal.     tiotropium 18 MCG inhalation capsule  Commonly known as:  SPIRIVA  Place 1 capsule (18 mcg total) into inhaler and inhale daily.          The results of significant diagnostics from this hospitalization (including imaging, microbiology, ancillary and laboratory) are listed below for reference.    Significant Diagnostic Studies: Dg Chest 2 View  04/09/2013   *RADIOLOGY REPORT*  Clinical Data: Chest pain, shortness of breath, cough.  CHEST - 2 VIEW  Comparison: 01/07/2013  Findings: There is hyperinflation of the lungs compatible with COPD.  Peribronchial thickening.  Heart is normal size.  Mild chronic interstitial prominence.  No confluent opacities or effusions.  No acute bony abnormality.  IMPRESSION: COPD/chronic changes.  No acute findings.   Original Report Authenticated By: Charlett Nose, M.D.    Microbiology: No results found for this or any previous visit (from the past 240 hour(s)).   Labs: Basic Metabolic Panel:  Recent Labs Lab 04/09/13 2333 04/10/13 0630 04/11/13 1040  NA 137 136 135  K 4.3 3.9 4.1  CL 101 102 99  CO2 27 24 28   GLUCOSE 122* 185* 122*  BUN 14 18 31*  CREATININE 0.93 0.95 1.24*  CALCIUM 9.2 8.9 9.2   Liver Function Tests: No results found for this basename: AST, ALT, ALKPHOS, BILITOT, PROT, ALBUMIN,  in the last 168 hours No results found for this basename: LIPASE, AMYLASE,  in the  last 168 hours No results found for this basename: AMMONIA,  in the last 168 hours CBC:  Recent Labs Lab 04/09/13 2333 04/10/13 0630 04/11/13 1040  WBC 7.7 7.1 11.3*  HGB 16.0* 14.9 14.6  HCT 47.2* 44.9 44.2  MCV 86.6 86.8 88.0  PLT 133* 139* 153   Cardiac Enzymes:  Recent Labs Lab 04/11/13 1040  TROPONINI <0.30   BNP: BNP (last 3 results)  Recent Labs  04/11/13 1040  PROBNP 238.5*   CBG:  Recent Labs Lab 04/10/13 2115 04/11/13 0749 04/11/13 1142 04/11/13 1655 04/11/13 2158  GLUCAP 177* 104* 116* 150* 185*    Time coordinating discharge: Over 30 minutes  Signed:  Manson Passey, MD  [facility]  04/12/2013, 11:28 AM  Pager #: [phone]

## 2013-04-12 NOTE — Care Management Note (Signed)
Cm spoke with patient concerning dc planning. MD orders for nebulizer and home oxygen therapy. [facility] DME rep Jermaine notified of referral. DME delivery scheduled to room prior to discharge. Pt expressed concerns of affording medications. Pt informed ineligible for medication assistance due to government insurance. Pt offered choice of HH agencies for disease management. Pt declined. No other barriers identified.   Victoria Bird [phone]

## 2013-04-12 NOTE — Progress Notes (Signed)
PHARMACIST - PHYSICIAN COMMUNICATION DR:   Geanie Kenning CONCERNING: Antibiotic IV to Oral Route Change Policy  RECOMMENDATION: This patient is on day #3 Levaquin 750mg  IV q24h for CAP vs COPD exacerbation. Based on criteria approved by the Pharmacy and Therapeutics Committee, the antibiotic(s) is/are being converted to the equivalent oral dose form(s).   DESCRIPTION: These criteria include:  Patient being treated for a respiratory tract infection, urinary tract infection, cellulitis or clostridium difficile associated diarrhea if on metronidazole  The patient is not neutropenic and does not exhibit a GI malabsorption state  The patient is eating (either orally or via tube) and/or has been taking other orally administered medications for a least 24 hours  The patient is improving clinically and has a Tmax < 100.5  Note: SCr increased 1.24 yesterday, CrCl ~42 ml/min. Recommended Levaquin dose for CrCl < 97ml/min is 750mg  q48h. No repeat labs done today. Has already received dose this morning, so will f/u SCr tomorrow and adjust interval if needed.  If you have questions about this conversion, please contact the Pharmacy Department [phone]  Loralee Pacas, PharmD, BCPS Pager: [phone] 04/12/2013 9:05 AM

## 2013-04-12 NOTE — Progress Notes (Signed)
SATURATION QUALIFICATIONS: (This note is used to comply with regulatory documentation for home oxygen)  Patient Saturations on Room Air at Rest =86 %  Patient Saturations on Room Air while Ambulating =84%  Patient Saturations on 3 Liters of oxygen while Ambulating = 92%  Please briefly explain why patient needs home oxygen:  Desaturation

## 2013-11-10 DIAGNOSIS — E119 Type 2 diabetes mellitus without complications: Secondary | ICD-10-CM | POA: Diagnosis not present

## 2013-11-10 DIAGNOSIS — I10 Essential (primary) hypertension: Secondary | ICD-10-CM | POA: Diagnosis not present

## 2013-11-10 DIAGNOSIS — E1169 Type 2 diabetes mellitus with other specified complication: Secondary | ICD-10-CM | POA: Diagnosis not present

## 2013-11-10 DIAGNOSIS — E782 Mixed hyperlipidemia: Secondary | ICD-10-CM | POA: Diagnosis not present

## 2013-11-13 DIAGNOSIS — E119 Type 2 diabetes mellitus without complications: Secondary | ICD-10-CM | POA: Diagnosis not present

## 2014-06-13 ENCOUNTER — Encounter (HOSPITAL_COMMUNITY): Payer: Self-pay | Admitting: Emergency Medicine

## 2014-06-13 ENCOUNTER — Emergency Department (HOSPITAL_COMMUNITY): Payer: Medicare Other

## 2014-06-13 ENCOUNTER — Emergency Department (HOSPITAL_COMMUNITY)
Admission: EM | Admit: 2014-06-13 | Discharge: 2014-06-14 | Disposition: A | Discharge status: Home / Self Care | Payer: Medicare Other | Attending: Emergency Medicine | Admitting: Emergency Medicine

## 2014-06-13 DIAGNOSIS — M25519 Pain in unspecified shoulder: Secondary | ICD-10-CM | POA: Diagnosis not present

## 2014-06-13 DIAGNOSIS — F411 Generalized anxiety disorder: Secondary | ICD-10-CM | POA: Diagnosis not present

## 2014-06-13 DIAGNOSIS — W19XXXA Unspecified fall, initial encounter: Secondary | ICD-10-CM

## 2014-06-13 DIAGNOSIS — J449 Chronic obstructive pulmonary disease, unspecified: Secondary | ICD-10-CM | POA: Diagnosis not present

## 2014-06-13 DIAGNOSIS — S199XXA Unspecified injury of neck, initial encounter: Secondary | ICD-10-CM

## 2014-06-13 DIAGNOSIS — S0990XA Unspecified injury of head, initial encounter: Secondary | ICD-10-CM | POA: Diagnosis not present

## 2014-06-13 DIAGNOSIS — Z792 Long term (current) use of antibiotics: Secondary | ICD-10-CM | POA: Insufficient documentation

## 2014-06-13 DIAGNOSIS — G8929 Other chronic pain: Secondary | ICD-10-CM | POA: Insufficient documentation

## 2014-06-13 DIAGNOSIS — S0993XA Unspecified injury of face, initial encounter: Secondary | ICD-10-CM | POA: Diagnosis present

## 2014-06-13 DIAGNOSIS — Y9389 Activity, other specified: Secondary | ICD-10-CM | POA: Diagnosis not present

## 2014-06-13 DIAGNOSIS — S4980XA Other specified injuries of shoulder and upper arm, unspecified arm, initial encounter: Secondary | ICD-10-CM | POA: Diagnosis not present

## 2014-06-13 DIAGNOSIS — Z79899 Other long term (current) drug therapy: Secondary | ICD-10-CM | POA: Diagnosis not present

## 2014-06-13 DIAGNOSIS — R51 Headache: Secondary | ICD-10-CM | POA: Diagnosis not present

## 2014-06-13 DIAGNOSIS — IMO0002 Reserved for concepts with insufficient information to code with codable children: Secondary | ICD-10-CM | POA: Insufficient documentation

## 2014-06-13 DIAGNOSIS — I10 Essential (primary) hypertension: Secondary | ICD-10-CM | POA: Diagnosis not present

## 2014-06-13 DIAGNOSIS — J4489 Other specified chronic obstructive pulmonary disease: Secondary | ICD-10-CM | POA: Insufficient documentation

## 2014-06-13 DIAGNOSIS — F17200 Nicotine dependence, unspecified, uncomplicated: Secondary | ICD-10-CM | POA: Diagnosis not present

## 2014-06-13 DIAGNOSIS — W010XXA Fall on same level from slipping, tripping and stumbling without subsequent striking against object, initial encounter: Secondary | ICD-10-CM | POA: Diagnosis not present

## 2014-06-13 DIAGNOSIS — S0031XA Abrasion of nose, initial encounter: Secondary | ICD-10-CM

## 2014-06-13 DIAGNOSIS — Y92009 Unspecified place in unspecified non-institutional (private) residence as the place of occurrence of the external cause: Secondary | ICD-10-CM | POA: Diagnosis not present

## 2014-06-13 DIAGNOSIS — S46909A Unspecified injury of unspecified muscle, fascia and tendon at shoulder and upper arm level, unspecified arm, initial encounter: Secondary | ICD-10-CM | POA: Insufficient documentation

## 2014-06-13 DIAGNOSIS — E119 Type 2 diabetes mellitus without complications: Secondary | ICD-10-CM | POA: Diagnosis not present

## 2014-06-13 DIAGNOSIS — M542 Cervicalgia: Secondary | ICD-10-CM | POA: Diagnosis not present

## 2014-06-13 DIAGNOSIS — Z880 Allergy status to penicillin: Secondary | ICD-10-CM | POA: Insufficient documentation

## 2014-06-13 NOTE — ED Notes (Signed)
Patient states she tripped over a cord at home several hours ago. Patient states she struck her head. Patient states she is unsteady with ambulation, but was able to ambulate to triage without difficulty. Patient denies LOC, reports being disoriented and nauseated. Patient c/o right shoulder discomfort, states she attempted to catch herself when falling. Patient also c/o neck pain. Denies blood thinners.

## 2014-06-13 NOTE — ED Provider Notes (Signed)
CSN: [id number]     Arrival date & time 06/13/14  2320 History   First MD Initiated Contact with Patient 06/13/14 2331     Chief Complaint  Patient presents with  . Fall  . Neck Pain  . Shoulder Pain    right    (Consider location/radiation/quality/duration/timing/severity/associated sxs/prior Treatment) Patient is a 68 y.o. female presenting with fall, neck pain, and shoulder pain. The history is provided by the patient and medical records.  Fall Associated symptoms include arthralgias, headaches and neck pain.  Neck Pain Associated symptoms: headaches   Shoulder Pain Associated symptoms include arthralgias, headaches and neck pain.   This is a 67 year old female with past medical history significant for hypertension, diabetes, COPD, presenting to the ED after a fall. Patient states she tripped over an electrical cord and fell face first.  She did strike her head against the floor, no LOC reported.  Patient states she had some lightheadedness and nausea upon standing too quickly or moving around initially after fall which seems to be improving.  Denies visual disturbance, tinnitus, changes in speech, confusion, dizziness, numbness, weakness, paresthesias, difficulty walking, or vomiting.  Patient states she now has a generalized headache, some pain in her nose, neck pain, and right shoulder pain.  Patient not currently on any anti-coagulants.  Patient able to ambulate in triage without difficulty.  Hypertensive on arrival.  Past Medical History  Diagnosis Date  . Diabetes mellitus   . Hypertension   . COPD (chronic obstructive pulmonary disease)   . Bronchiectasis   . Chronic pain   . Anxiety   . Tracheobronchitis 01/01/2012   Past Surgical History  Procedure Laterality Date  . Appendectomy    . Cholecystectomy    . Abdominal hysterectomy    . Joint replacement    . Fracture surgery     Family History  Problem Relation Age of Onset  . Heart attack Mother    History   Substance Use Topics  . Smoking status: Current Every Day Smoker -- 0.50 packs/day for 39 years    Types: Cigarettes  . Smokeless tobacco: Never Used  . Alcohol Use: No   OB History   Grav Para Term Preterm Abortions TAB SAB Ect Mult Living                 Review of Systems  Musculoskeletal: Positive for arthralgias and neck pain.  Neurological: Positive for headaches.  All other systems reviewed and are negative.     Allergies  Aspartame and phenylalanine; Codeine; Penicillins; Prednisone; and Sulfa antibiotics  Home Medications   Prior to Admission medications   Medication Sig Start Date End Date Taking? Authorizing Provider  albuterol (PROVENTIL HFA;VENTOLIN HFA) 108 (90 BASE) MCG/ACT inhaler Inhale 2 puffs into the lungs every 6 (six) hours as needed. For shortness of breath. 12/18/11   Thurnell Lose, MD  albuterol (PROVENTIL) (5 MG/ML) 0.5% nebulizer solution Take 0.5 mLs (2.5 mg total) by nebulization every 4 (four) hours as needed for wheezing or shortness of breath. 04/12/13   Robbie Lis, MD  ALPRAZolam Duanne Moron) 0.5 MG tablet Take 1 tablet (0.5 mg total) by mouth at bedtime as needed for sleep (sleep). 04/12/13   Robbie Lis, MD  amLODipine (NORVASC) 5 MG tablet Take 1 tablet (5 mg total) by mouth daily. 04/12/13   Robbie Lis, MD  budesonide-formoterol [facility]) 160-4.5 MCG/ACT inhaler Inhale 2 puffs into the lungs 2 (two) times daily. 04/12/13   Robbie Lis, MD  guaiFENesin (MUCINEX) 600 MG 12 hr tablet Take 2 tablets (1,200 mg total) by mouth 2 (two) times daily as needed for congestion. 04/12/13   Robbie Lis, MD  HYDROcodone-acetaminophen (NORCO) 5-325 MG per tablet Take 1 tablet by mouth every 6 (six) hours as needed for pain. 04/12/13   Robbie Lis, MD  ipratropium (ATROVENT) 0.02 % nebulizer solution Take 2.5 mLs (0.5 mg total) by nebulization every 4 (four) hours as needed for wheezing. 04/12/13   Robbie Lis, MD  levofloxacin (LEVAQUIN) 500 MG tablet  Take 1 tablet (500 mg total) by mouth daily. 04/12/13   Robbie Lis, MD  metFORMIN (GLUCOPHAGE) 500 MG tablet Take 500 mg by mouth 2 (two) times daily with a meal.     Historical Provider, MD  tiotropium (SPIRIVA) 18 MCG inhalation capsule Place 1 capsule (18 mcg total) into inhaler and inhale daily. 04/12/13   Robbie Lis, MD   BP 176/110  Pulse 85  Temp(Src) 97.6 F (36.4 C) (Oral)  Resp 22  Ht 5\' 3"  (1.6 m)  Wt 161 lb (73.029 kg)  BMI 28.53 kg/m2  SpO2 99%  Physical Exam  Nursing note and vitals reviewed. Constitutional: She is oriented to person, place, and time. She appears well-developed and well-nourished. No distress.  HENT:  Head: Normocephalic and atraumatic.  Mouth/Throat: Oropharynx is clear and moist.  Small abrasion noted to bridge of nose; mild TTP; no bruising or gross deformities; no epistaxis, septal hematoma, or deformity noted; mid-face stable  Eyes: Conjunctivae and EOM are normal. Pupils are equal, round, and reactive to light.  Neck: Normal range of motion. Neck supple.  Cardiovascular: Normal rate, regular rhythm and normal heart sounds.   Pulmonary/Chest: Effort normal and breath sounds normal. No respiratory distress. She has no wheezes.  Abdominal: Soft. Bowel sounds are normal.  Musculoskeletal: Normal range of motion. She exhibits no edema.       Right shoulder: Normal.       Cervical back: Normal.  Endorses pain of cervical spine and right shoulder without focal tenderness; no swelling or bony deformities noted; full ROM of CS and right shoulder, mild pain noted; moving all 4 extremities well  Neurological: She is alert and oriented to person, place, and time.  AAOx3, answering questions appropriately; equal strength UE and LE bilaterally; CN grossly intact; moves all extremities appropriately without ataxia; no focal neuro deficits or facial asymmetry appreciated; ambulating unassisted without difficulty; steady gait  Skin: Skin is warm and dry. She is  not diaphoretic.  Psychiatric: She has a normal mood and affect.    ED Course  Procedures (including critical care time) Labs Review Labs Reviewed - No data to display  Imaging Review Dg Shoulder Right  06/14/2014   CLINICAL DATA:  Status post fall; right posterior shoulder pain and limited range of motion.  EXAM: RIGHT SHOULDER - 2+ VIEW  COMPARISON:  None.  FINDINGS: There is no evidence of fracture or dislocation. The right humeral head is seated within the glenoid fossa. The acromioclavicular joint is unremarkable in appearance. No significant soft tissue abnormalities are seen. The visualized portions of the right lung are clear.  IMPRESSION: No evidence of fracture or dislocation.   Electronically Signed   By: Garald Balding M.D.   On: 06/14/2014 00:03   Ct Head Wo Contrast  06/14/2014   CLINICAL DATA:  Status post fall; injury to head and face. Neck pain.  EXAM: CT HEAD WITHOUT CONTRAST  CT MAXILLOFACIAL WITHOUT CONTRAST  CT CERVICAL SPINE WITHOUT CONTRAST  TECHNIQUE: Multidetector CT imaging of the head, cervical spine, and maxillofacial structures were performed using the standard protocol without intravenous contrast. Multiplanar CT image reconstructions of the cervical spine and maxillofacial structures were also generated.  COMPARISON:  None.  FINDINGS: CT HEAD FINDINGS  There is no evidence of acute infarction, mass lesion, or intra- or extra-axial hemorrhage on CT.  Minimal periventricular white matter change likely reflects small vessel ischemic microangiopathy.  The posterior fossa, including the cerebellum, brainstem and fourth ventricle, is within normal limits. The third and lateral ventricles, and basal ganglia are unremarkable in appearance. The cerebral hemispheres are symmetric in appearance, with normal gray-white differentiation. No mass effect or midline shift is seen.  There is no evidence of fracture; visualized osseous structures are unremarkable in appearance. The orbits  are within normal limits. The paranasal sinuses and mastoid air cells are well-aerated. No significant soft tissue abnormalities are seen.  CT MAXILLOFACIAL FINDINGS  There is no evidence of fracture or dislocation. The maxilla and mandible appear intact. The nasal bone is unremarkable in appearance. There is loosening with regard to several of the remaining mandibular teeth.  The orbits are intact bilaterally. The visualized paranasal sinuses and mastoid air cells are well-aerated.  No significant soft tissue abnormalities are seen. The parapharyngeal fat planes are preserved. The nasopharynx, oropharynx and hypopharynx are unremarkable in appearance. The visualized portions of the valleculae and piriform sinuses are grossly unremarkable.  Mild calcification is noted at the carotid bifurcations bilaterally.  The parotid and submandibular glands are within normal limits. No cervical lymphadenopathy is seen.  CT CERVICAL SPINE FINDINGS  There is no evidence of fracture or subluxation. Vertebral bodies demonstrate normal height and alignment. Intervertebral disc spaces are preserved. Prevertebral soft tissues are within normal limits. The visualized neural foramina are grossly unremarkable.  There is diffuse enlargement of the left thyroid lobe, reflecting an underlying poorly characterized hypodense lesion. Associated minimal calcification is seen. The visualized lung apices are clear. No significant soft tissue abnormalities are seen.  IMPRESSION: 1. No evidence of traumatic intracranial injury or fracture. 2. No evidence of fracture or subluxation along the cervical spine. 3. No evidence of fracture or dislocation with regard to the maxillofacial structures. 4. Minimal small vessel ischemic microangiopathy. 5. Mild calcification at the carotid bifurcations bilaterally. 6. Diffuse enlargement of the left thyroid lobe, reflecting an underlying poorly characterized hypodense lesion. Recommend further evaluation with  thyroid ultrasound. If patient is clinically hyperthyroid, consider nuclear medicine thyroid uptake and scan.   Electronically Signed   By: Garald Balding M.D.   On: 06/14/2014 00:22   Ct Cervical Spine Wo Contrast  06/14/2014   CLINICAL DATA:  Status post fall; injury to head and face. Neck pain.  EXAM: CT HEAD WITHOUT CONTRAST  CT MAXILLOFACIAL WITHOUT CONTRAST  CT CERVICAL SPINE WITHOUT CONTRAST  TECHNIQUE: Multidetector CT imaging of the head, cervical spine, and maxillofacial structures were performed using the standard protocol without intravenous contrast. Multiplanar CT image reconstructions of the cervical spine and maxillofacial structures were also generated.  COMPARISON:  None.  FINDINGS: CT HEAD FINDINGS  There is no evidence of acute infarction, mass lesion, or intra- or extra-axial hemorrhage on CT.  Minimal periventricular white matter change likely reflects small vessel ischemic microangiopathy.  The posterior fossa, including the cerebellum, brainstem and fourth ventricle, is within normal limits. The third and lateral ventricles, and basal ganglia are unremarkable in appearance. The cerebral hemispheres are symmetric in appearance, with  normal gray-white differentiation. No mass effect or midline shift is seen.  There is no evidence of fracture; visualized osseous structures are unremarkable in appearance. The orbits are within normal limits. The paranasal sinuses and mastoid air cells are well-aerated. No significant soft tissue abnormalities are seen.  CT MAXILLOFACIAL FINDINGS  There is no evidence of fracture or dislocation. The maxilla and mandible appear intact. The nasal bone is unremarkable in appearance. There is loosening with regard to several of the remaining mandibular teeth.  The orbits are intact bilaterally. The visualized paranasal sinuses and mastoid air cells are well-aerated.  No significant soft tissue abnormalities are seen. The parapharyngeal fat planes are preserved. The  nasopharynx, oropharynx and hypopharynx are unremarkable in appearance. The visualized portions of the valleculae and piriform sinuses are grossly unremarkable.  Mild calcification is noted at the carotid bifurcations bilaterally.  The parotid and submandibular glands are within normal limits. No cervical lymphadenopathy is seen.  CT CERVICAL SPINE FINDINGS  There is no evidence of fracture or subluxation. Vertebral bodies demonstrate normal height and alignment. Intervertebral disc spaces are preserved. Prevertebral soft tissues are within normal limits. The visualized neural foramina are grossly unremarkable.  There is diffuse enlargement of the left thyroid lobe, reflecting an underlying poorly characterized hypodense lesion. Associated minimal calcification is seen. The visualized lung apices are clear. No significant soft tissue abnormalities are seen.  IMPRESSION: 1. No evidence of traumatic intracranial injury or fracture. 2. No evidence of fracture or subluxation along the cervical spine. 3. No evidence of fracture or dislocation with regard to the maxillofacial structures. 4. Minimal small vessel ischemic microangiopathy. 5. Mild calcification at the carotid bifurcations bilaterally. 6. Diffuse enlargement of the left thyroid lobe, reflecting an underlying poorly characterized hypodense lesion. Recommend further evaluation with thyroid ultrasound. If patient is clinically hyperthyroid, consider nuclear medicine thyroid uptake and scan.   Electronically Signed   By: Garald Balding M.D.   On: 06/14/2014 00:22   Ct Maxillofacial Wo Cm  06/14/2014   CLINICAL DATA:  Status post fall; injury to head and face. Neck pain.  EXAM: CT HEAD WITHOUT CONTRAST  CT MAXILLOFACIAL WITHOUT CONTRAST  CT CERVICAL SPINE WITHOUT CONTRAST  TECHNIQUE: Multidetector CT imaging of the head, cervical spine, and maxillofacial structures were performed using the standard protocol without intravenous contrast. Multiplanar CT image  reconstructions of the cervical spine and maxillofacial structures were also generated.  COMPARISON:  None.  FINDINGS: CT HEAD FINDINGS  There is no evidence of acute infarction, mass lesion, or intra- or extra-axial hemorrhage on CT.  Minimal periventricular white matter change likely reflects small vessel ischemic microangiopathy.  The posterior fossa, including the cerebellum, brainstem and fourth ventricle, is within normal limits. The third and lateral ventricles, and basal ganglia are unremarkable in appearance. The cerebral hemispheres are symmetric in appearance, with normal gray-white differentiation. No mass effect or midline shift is seen.  There is no evidence of fracture; visualized osseous structures are unremarkable in appearance. The orbits are within normal limits. The paranasal sinuses and mastoid air cells are well-aerated. No significant soft tissue abnormalities are seen.  CT MAXILLOFACIAL FINDINGS  There is no evidence of fracture or dislocation. The maxilla and mandible appear intact. The nasal bone is unremarkable in appearance. There is loosening with regard to several of the remaining mandibular teeth.  The orbits are intact bilaterally. The visualized paranasal sinuses and mastoid air cells are well-aerated.  No significant soft tissue abnormalities are seen. The parapharyngeal fat planes are preserved.  The nasopharynx, oropharynx and hypopharynx are unremarkable in appearance. The visualized portions of the valleculae and piriform sinuses are grossly unremarkable.  Mild calcification is noted at the carotid bifurcations bilaterally.  The parotid and submandibular glands are within normal limits. No cervical lymphadenopathy is seen.  CT CERVICAL SPINE FINDINGS  There is no evidence of fracture or subluxation. Vertebral bodies demonstrate normal height and alignment. Intervertebral disc spaces are preserved. Prevertebral soft tissues are within normal limits. The visualized neural foramina  are grossly unremarkable.  There is diffuse enlargement of the left thyroid lobe, reflecting an underlying poorly characterized hypodense lesion. Associated minimal calcification is seen. The visualized lung apices are clear. No significant soft tissue abnormalities are seen.  IMPRESSION: 1. No evidence of traumatic intracranial injury or fracture. 2. No evidence of fracture or subluxation along the cervical spine. 3. No evidence of fracture or dislocation with regard to the maxillofacial structures. 4. Minimal small vessel ischemic microangiopathy. 5. Mild calcification at the carotid bifurcations bilaterally. 6. Diffuse enlargement of the left thyroid lobe, reflecting an underlying poorly characterized hypodense lesion. Recommend further evaluation with thyroid ultrasound. If patient is clinically hyperthyroid, consider nuclear medicine thyroid uptake and scan.   Electronically Signed   By: Garald Balding M.D.   On: 06/14/2014 00:22     EKG Interpretation None      MDM   Final diagnoses:  Fall at home, initial encounter  Abrasion of nose, initial encounter   68 year old female with mechanical fall in her home after tripping over an electrical cord, fell onto her face without LOC.  Patient not currently on any anti-coagulants.  Neuro exam non-focal, ambulating unassisted with steady gait- no signs of ataxia.  Imaging negative for acute findings.  Patient has remained neurologically intact in the ED, tolerating PO without difficulty.  Have attempted to give her pain medication, however patient has multiple allergies and is not a good candidate for high dose NSAIDs due to her renal function (last on record from July SrCr was 1.24).  Instructed to FU with PCP.  Discussed plan with patient, he/she acknowledged understanding and agreed with plan of care.  Return precautions given for new or worsening symptoms.    BP has stabilized prior to discharge Filed Vitals:   06/14/14 0140  BP: 144/61  Pulse:  58  Temp: 97.7 F (36.5 C)  Resp: [id number], [place] 06/14/14 [phone]

## 2014-06-14 MED ORDER — TRAMADOL HCL 50 MG PO TABS
100.0000 mg | ORAL_TABLET | Freq: Once | ORAL | Status: DC
  Filled 2014-06-14: qty 2

## 2014-06-14 NOTE — ED Provider Notes (Signed)
Medical screening examination/treatment/procedure(s) were performed by non-physician practitioner and as supervising physician I was immediately available for consultation/collaboration.   EKG Interpretation None        Merryl Hacker, MD 06/14/14 [phone]

## 2014-06-14 NOTE — Discharge Instructions (Signed)
May take tylenol or motrin as needed for pain control. Follow up with your primary care physician. Return to the ED for new concerns.

## 2014-10-08 ENCOUNTER — Inpatient Hospital Stay (HOSPITAL_COMMUNITY)
Admission: EM | Admit: 2014-10-08 | Discharge: 2014-10-12 | DRG: 192 | Disposition: A | Discharge status: Home / Self Care | Payer: Medicare Other | Attending: Internal Medicine | Admitting: Internal Medicine

## 2014-10-08 ENCOUNTER — Encounter (HOSPITAL_COMMUNITY): Payer: Self-pay

## 2014-10-08 ENCOUNTER — Emergency Department (HOSPITAL_COMMUNITY): Payer: Medicare Other

## 2014-10-08 DIAGNOSIS — R059 Cough, unspecified: Secondary | ICD-10-CM

## 2014-10-08 DIAGNOSIS — Z880 Allergy status to penicillin: Secondary | ICD-10-CM

## 2014-10-08 DIAGNOSIS — Z7901 Long term (current) use of anticoagulants: Secondary | ICD-10-CM

## 2014-10-08 DIAGNOSIS — J441 Chronic obstructive pulmonary disease with (acute) exacerbation: Principal | ICD-10-CM | POA: Diagnosis present

## 2014-10-08 DIAGNOSIS — Z885 Allergy status to narcotic agent status: Secondary | ICD-10-CM

## 2014-10-08 DIAGNOSIS — Z90710 Acquired absence of both cervix and uterus: Secondary | ICD-10-CM

## 2014-10-08 DIAGNOSIS — R05 Cough: Secondary | ICD-10-CM | POA: Diagnosis not present

## 2014-10-08 DIAGNOSIS — E1165 Type 2 diabetes mellitus with hyperglycemia: Secondary | ICD-10-CM | POA: Diagnosis not present

## 2014-10-08 DIAGNOSIS — I959 Hypotension, unspecified: Secondary | ICD-10-CM | POA: Diagnosis present

## 2014-10-08 DIAGNOSIS — J40 Bronchitis, not specified as acute or chronic: Secondary | ICD-10-CM | POA: Diagnosis not present

## 2014-10-08 DIAGNOSIS — M549 Dorsalgia, unspecified: Secondary | ICD-10-CM | POA: Diagnosis present

## 2014-10-08 DIAGNOSIS — J984 Other disorders of lung: Secondary | ICD-10-CM | POA: Diagnosis not present

## 2014-10-08 DIAGNOSIS — I10 Essential (primary) hypertension: Secondary | ICD-10-CM | POA: Diagnosis not present

## 2014-10-08 DIAGNOSIS — Z9049 Acquired absence of other specified parts of digestive tract: Secondary | ICD-10-CM | POA: Diagnosis present

## 2014-10-08 DIAGNOSIS — F17210 Nicotine dependence, cigarettes, uncomplicated: Secondary | ICD-10-CM | POA: Diagnosis present

## 2014-10-08 DIAGNOSIS — F419 Anxiety disorder, unspecified: Secondary | ICD-10-CM | POA: Diagnosis not present

## 2014-10-08 DIAGNOSIS — J449 Chronic obstructive pulmonary disease, unspecified: Secondary | ICD-10-CM | POA: Diagnosis present

## 2014-10-08 DIAGNOSIS — G8929 Other chronic pain: Secondary | ICD-10-CM | POA: Diagnosis present

## 2014-10-08 DIAGNOSIS — R0602 Shortness of breath: Secondary | ICD-10-CM | POA: Diagnosis not present

## 2014-10-08 DIAGNOSIS — Z794 Long term (current) use of insulin: Secondary | ICD-10-CM

## 2014-10-08 DIAGNOSIS — Z882 Allergy status to sulfonamides status: Secondary | ICD-10-CM

## 2014-10-08 MED ORDER — SODIUM CHLORIDE 0.9 % IV BOLUS (SEPSIS)
1000.0000 mL | Freq: Once | INTRAVENOUS | Status: AC
  Administered 2014-10-08: 1000 mL via INTRAVENOUS

## 2014-10-08 MED ORDER — ALBUTEROL (5 MG/ML) CONTINUOUS INHALATION SOLN
10.0000 mg/h | INHALATION_SOLUTION | RESPIRATORY_TRACT | Status: DC
  Administered 2014-10-08: 10 mg/h via RESPIRATORY_TRACT
  Filled 2014-10-08: qty 20

## 2014-10-08 MED ORDER — DEXTROSE 5 % IV SOLN
500.0000 mg | Freq: Once | INTRAVENOUS | Status: AC
  Administered 2014-10-08: 500 mg via INTRAVENOUS
  Filled 2014-10-08: qty 500

## 2014-10-08 MED ORDER — BENZONATATE 100 MG PO CAPS
200.0000 mg | ORAL_CAPSULE | Freq: Once | ORAL | Status: AC
  Administered 2014-10-08: 200 mg via ORAL
  Filled 2014-10-08: qty 2

## 2014-10-08 MED ORDER — HYDROCODONE-ACETAMINOPHEN 5-325 MG PO TABS: 1.0000 | ORAL_TABLET | Freq: Once | ORAL | Status: DC

## 2014-10-08 MED ORDER — IPRATROPIUM BROMIDE 0.02 % IN SOLN
1.0000 mg | Freq: Once | RESPIRATORY_TRACT | Status: AC
  Administered 2014-10-08: 1 mg via RESPIRATORY_TRACT
  Filled 2014-10-08: qty 5

## 2014-10-08 MED ORDER — MAGNESIUM SULFATE 2 GM/50ML IV SOLN
2.0000 g | Freq: Once | INTRAVENOUS | Status: AC
  Administered 2014-10-09: 2 g via INTRAVENOUS
  Filled 2014-10-08: qty 50

## 2014-10-08 NOTE — ED Provider Notes (Addendum)
CSN: [id number]     Arrival date & time 10/08/14  2131 History   First MD Initiated Contact with Patient 10/08/14 2259     Chief Complaint  Patient presents with  . Shortness of Breath     (Consider location/radiation/quality/duration/timing/severity/associated sxs/prior Treatment) HPI  Victoria Bird is a 69 y.o. female with past medical history of COPD, hypertension, diabetes presenting today with shortness of breath and coughing. Patient states this has been a prolonged course since Thanksgiving. She describes a productive cough that was initially your white sputum, now is green and yellow sputum. She's had sinus congestion as well. Patient discharged home medications including albuterol, Spiriva, Mucinex, allergy medications without any relief. Her symptoms are made worse with extreme heat or extreme cold weather. She's had subjective fevers, she has sick contacts and all of her family members at home. Patient also describes diffuse body aches, she did not get her flu shot this year. He has associated bilateral rib pain and shortness of breath that is worse with the cough. She has had multiple episodes of pneumonia in the past. Patient has no further complaints.  10 Systems reviewed and are negative for acute change except as noted in the HPI.     Past Medical History  Diagnosis Date  . Diabetes mellitus   . Hypertension   . COPD (chronic obstructive pulmonary disease)   . Bronchiectasis   . Chronic pain   . Anxiety   . Tracheobronchitis 01/01/2012   Past Surgical History  Procedure Laterality Date  . Appendectomy    . Cholecystectomy    . Abdominal hysterectomy    . Joint replacement    . Fracture surgery     Family History  Problem Relation Age of Onset  . Heart attack Mother    History  Substance Use Topics  . Smoking status: Current Every Day Smoker -- 0.50 packs/day for 39 years    Types: Cigarettes  . Smokeless tobacco: Never Used  . Alcohol Use: No   OB  History    No data available     Review of Systems    Allergies  Aspartame and phenylalanine; Tramadol; Codeine; Penicillins; Prednisone; and Sulfa antibiotics  Home Medications   Prior to Admission medications   Medication Sig Start Date End Date Taking? Authorizing Provider  albuterol (PROVENTIL HFA;VENTOLIN HFA) 108 (90 BASE) MCG/ACT inhaler Inhale 2 puffs into the lungs every 6 (six) hours as needed. For shortness of breath. 12/18/11  Yes Thurnell Lose, MD  albuterol (PROVENTIL) (5 MG/ML) 0.5% nebulizer solution Take 0.5 mLs (2.5 mg total) by nebulization every 4 (four) hours as needed for wheezing or shortness of breath. 04/12/13  Yes Robbie Lis, MD  ALPRAZolam Duanne Moron) 0.5 MG tablet Take 0.5 mg by mouth 3 (three) times daily as needed for anxiety.   Yes Historical Provider, MD  amLODipine (NORVASC) 5 MG tablet Take 1 tablet (5 mg total) by mouth daily. 04/12/13  Yes Robbie Lis, MD  Chlorpheniramine-APAP (CORICIDIN) 2-325 MG TABS Take 1 tablet by mouth every 4 (four) hours.   Yes Historical Provider, MD  guaiFENesin (MUCINEX) 600 MG 12 hr tablet Take 600 mg by mouth 2 (two) times daily.   Yes Historical Provider, MD  ipratropium (ATROVENT) 0.02 % nebulizer solution Take 2.5 mLs (0.5 mg total) by nebulization every 4 (four) hours as needed for wheezing. 04/12/13  Yes Robbie Lis, MD  metFORMIN (GLUCOPHAGE) 500 MG tablet Take 500 mg by mouth 2 (two) times daily  with a meal.    Yes Historical Provider, MD  tiotropium (SPIRIVA) 18 MCG inhalation capsule Place 1 capsule (18 mcg total) into inhaler and inhale daily. 04/12/13  Yes Robbie Lis, MD   BP 131/79 mmHg  Pulse 94  Temp(Src) 98.3 F (36.8 C) (Oral)  Resp 18  SpO2 95% Physical Exam  Constitutional: She is oriented to person, place, and time. She appears well-developed and well-nourished. No distress.  Frequent coughing during evaluation.  HENT:  Head: Normocephalic and atraumatic.  Nose: Nose normal.  Mouth/Throat:  Oropharynx is clear and moist. No oropharyngeal exudate.  Eyes: Conjunctivae and EOM are normal. Pupils are equal, round, and reactive to light. No scleral icterus.  Neck: Normal range of motion. Neck supple. No JVD present. No tracheal deviation present. No thyromegaly present.  Cardiovascular: Normal rate, regular rhythm and normal heart sounds.  Exam reveals no gallop and no friction rub.   No murmur heard. Pulmonary/Chest: Effort normal. No respiratory distress. She has wheezes. She exhibits no tenderness.  Decreased breath sounds, expiratory wheezing heard. No respiratory distress or increased worker breathing.  Abdominal: Soft. Bowel sounds are normal. She exhibits no distension and no mass. There is no tenderness. There is no rebound and no guarding.  Musculoskeletal: Normal range of motion. She exhibits no edema or tenderness.  Lymphadenopathy:    She has no cervical adenopathy.  Neurological: She is alert and oriented to person, place, and time. No cranial nerve deficit. She exhibits normal muscle tone.  Skin: Skin is warm and dry. No rash noted. She is not diaphoretic. No erythema. No pallor.  Nursing note and vitals reviewed.   ED Course  Procedures (including critical care time) Labs Review Labs Reviewed - No data to display  Imaging Review Dg Chest 2 View  10/08/2014   CLINICAL DATA:  Cough for over 1 month. Cough becoming worse and productive over the past few days. Shortness of breath tonight.  EXAM: CHEST  2 VIEW  COMPARISON:  04/09/2013  FINDINGS: Pulmonary hyperinflation. Normal heart size and pulmonary vascularity. No focal airspace disease or consolidation in the lungs. No blunting of costophrenic angles. Slight scarring in the right lung base some of the prior study. No pneumothorax. Mediastinal contours appear intact. Surgical clips in the right upper quadrant. Degenerative changes in the thoracic spine.  IMPRESSION: Pulmonary hyperinflation. Scarring in the right lung  base. No evidence of active pulmonary disease.   Electronically Signed   By: Lucienne Capers M.D.   On: 10/08/2014 22:15     EKG Interpretation   Date/Time:  Thursday October 08 2014 21:45:17 EST Ventricular Rate:  89 PR Interval:  233 QRS Duration: 84 QT Interval:  375 QTC Calculation: 456 R Axis:   35 Text Interpretation:  Sinus rhythm No significant change since last  tracing Confirmed by Glynn Octave [id number]) on 10/08/2014 10:58:50 PM      MDM   Final diagnoses:  Cough    Patient since emergency department for worsening shortness of breath and cough over a period of almost 2 months. With her history of COPD and change in sputum production, she'll be treated aggressively. Patient was given hour-long breathing treatment, IV fluids, magnesium, azithromycin. She is satting 95% on room air. Chest x-ray is negative for pneumonia. Patient states she's allergic to all steroids thus this was not given to her.  Upon repeat evaluation the patient, wheezing has ceased. Patient appears much more comfortable laying in bed. He still has intermittent cough however this  is improved as well. She will be discharged with azithromycin course Tessalon Perles as she states this works for her. Advised to follow with her primary care physician within 3 days, her vital signs remain within her normal limits and she is safe for discharge. Patient only has tachycardia to 105, likely secondary to albuterol.   I was called to the room for recurrent wheezing and the patient feeling subjectively short of breath. Upon my repeat evaluation, the patient is indeed wheezing again. Patient does not feel comfortable going home at this time despite my interventions. She'll be admitted to [facility], MedSurg unit for continued management. Laboratory studies are currently pending at the request of Dr. Ernestina Patches.   Everlene Balls, MD 10/09/14 2831  Everlene Balls, MD 10/09/14 [phone]

## 2014-10-08 NOTE — ED Notes (Signed)
Pt complains of being short of breath and a cough since after Thanksgiving, pt has tried several things and intermittently gets better, she has not seen a doctor

## 2014-10-09 DIAGNOSIS — J449 Chronic obstructive pulmonary disease, unspecified: Secondary | ICD-10-CM | POA: Diagnosis present

## 2014-10-09 DIAGNOSIS — J441 Chronic obstructive pulmonary disease with (acute) exacerbation: Secondary | ICD-10-CM | POA: Diagnosis not present

## 2014-10-09 LAB — GLUCOSE, CAPILLARY
GLUCOSE-CAPILLARY: 149 mg/dL — AB (ref 70–99)
GLUCOSE-CAPILLARY: 201 mg/dL — AB (ref 70–99)
Glucose-Capillary: 189 mg/dL — ABNORMAL HIGH (ref 70–99)
Glucose-Capillary: 110 mg/dL — ABNORMAL HIGH (ref 70–99)

## 2014-10-09 LAB — CBC WITH DIFFERENTIAL/PLATELET
BASOS ABS: 0 10*3/uL (ref 0.0–0.1)
Basophils Relative: 0 % (ref 0–1)
EOS ABS: 0.2 10*3/uL (ref 0.0–0.7)
Eosinophils Relative: 2 % (ref 0–5)
HCT: 40.6 % (ref 36.0–46.0)
Hemoglobin: 13.9 g/dL (ref 12.0–15.0)
LYMPHS ABS: 1.7 10*3/uL (ref 0.7–4.0)
LYMPHS PCT: 19 % (ref 12–46)
MCH: 30.5 pg (ref 26.0–34.0)
MCHC: 34.2 g/dL (ref 30.0–36.0)
MCV: 89 fL (ref 78.0–100.0)
MONO ABS: 0.7 10*3/uL (ref 0.1–1.0)
Monocytes Relative: 8 % (ref 3–12)
NEUTROS PCT: 71 % (ref 43–77)
Neutro Abs: 6.6 10*3/uL (ref 1.7–7.7)
Platelets: 151 10*3/uL (ref 150–400)
RBC: 4.56 MIL/uL (ref 3.87–5.11)
RDW: 13.6 % (ref 11.5–15.5)
WBC: 9.2 10*3/uL (ref 4.0–10.5)

## 2014-10-09 LAB — BLOOD GAS, VENOUS
ACID-BASE DEFICIT: 1.6 mmol/L (ref 0.0–2.0)
Bicarbonate: 22.4 mEq/L (ref 20.0–24.0)
FIO2: 0.21 %
O2 SAT: 91.7 %
PATIENT TEMPERATURE: 98.6
PH VEN: 7.394 — AB (ref 7.250–7.300)
TCO2: 19.7 mmol/L (ref 0–100)
pCO2, Ven: 37.4 mmHg — ABNORMAL LOW (ref 45.0–50.0)
pO2, Ven: 59 mmHg — ABNORMAL HIGH (ref 30.0–45.0)

## 2014-10-09 LAB — CBG MONITORING, ED: GLUCOSE-CAPILLARY: 104 mg/dL — AB (ref 70–99)

## 2014-10-09 LAB — COMPREHENSIVE METABOLIC PANEL
ALBUMIN: 3.5 g/dL (ref 3.5–5.2)
ALK PHOS: 68 U/L (ref 39–117)
ALT: 17 U/L (ref 0–35)
AST: 24 U/L (ref 0–37)
Anion gap: 9 (ref 5–15)
BILIRUBIN TOTAL: 0.5 mg/dL (ref 0.3–1.2)
BUN: 8 mg/dL (ref 6–23)
CALCIUM: 8.1 mg/dL — AB (ref 8.4–10.5)
CHLORIDE: 109 meq/L (ref 96–112)
CO2: 23 mmol/L (ref 19–32)
CREATININE: 0.99 mg/dL (ref 0.50–1.10)
GFR calc non Af Amer: 58 mL/min — ABNORMAL LOW (ref >90)
GFR, EST AFRICAN AMERICAN: 67 mL/min — AB (ref >90)
GLUCOSE: 120 mg/dL — AB (ref 70–99)
POTASSIUM: 3.4 mmol/L — AB (ref 3.5–5.1)
Sodium: 141 mmol/L (ref 135–145)
TOTAL PROTEIN: 6.2 g/dL (ref 6.0–8.3)

## 2014-10-09 LAB — HEMOGLOBIN A1C
Hgb A1c MFr Bld: 5.7 % — ABNORMAL HIGH (ref <5.7)
Mean Plasma Glucose: 117 mg/dL — ABNORMAL HIGH (ref <117)

## 2014-10-09 MED ORDER — IPRATROPIUM-ALBUTEROL 0.5-2.5 (3) MG/3ML IN SOLN
3.0000 mL | Freq: Three times a day (TID) | RESPIRATORY_TRACT | Status: DC
  Administered 2014-10-09 – 2014-10-12 (×10): 3 mL via RESPIRATORY_TRACT
  Filled 2014-10-09 (×10): qty 3

## 2014-10-09 MED ORDER — IPRATROPIUM-ALBUTEROL 0.5-2.5 (3) MG/3ML IN SOLN
3.0000 mL | RESPIRATORY_TRACT | Status: DC
  Administered 2014-10-09 (×2): 3 mL via RESPIRATORY_TRACT
  Filled 2014-10-09 (×2): qty 3

## 2014-10-09 MED ORDER — HYDROCODONE-ACETAMINOPHEN 5-325 MG PO TABS
1.0000 | ORAL_TABLET | Freq: Once | ORAL | Status: AC
  Administered 2014-10-09: 1 via ORAL
  Filled 2014-10-09: qty 1

## 2014-10-09 MED ORDER — METHYLPREDNISOLONE SODIUM SUCC 125 MG IJ SOLR
125.0000 mg | Freq: Once | INTRAMUSCULAR | Status: AC
  Administered 2014-10-09: 125 mg via INTRAVENOUS
  Filled 2014-10-09: qty 2

## 2014-10-09 MED ORDER — GLUCERNA SHAKE PO LIQD
237.0000 mL | ORAL | Status: DC
  Filled 2014-10-09 (×4): qty 237

## 2014-10-09 MED ORDER — BENZONATATE 100 MG PO CAPS
100.0000 mg | ORAL_CAPSULE | Freq: Three times a day (TID) | ORAL | Status: DC
  Administered 2014-10-09 – 2014-10-12 (×11): 100 mg via ORAL
  Filled 2014-10-09 (×13): qty 1

## 2014-10-09 MED ORDER — HEPARIN SODIUM (PORCINE) 5000 UNIT/ML IJ SOLN
5000.0000 [IU] | Freq: Three times a day (TID) | INTRAMUSCULAR | Status: DC
  Administered 2014-10-09 (×2): 5000 [IU] via SUBCUTANEOUS
  Filled 2014-10-09 (×13): qty 1

## 2014-10-09 MED ORDER — INSULIN ASPART 100 UNIT/ML ~~LOC~~ SOLN: 0.0000 [IU] | SUBCUTANEOUS | Status: DC

## 2014-10-09 MED ORDER — ALBUTEROL SULFATE HFA 108 (90 BASE) MCG/ACT IN AERS: 2.0000 | INHALATION_SPRAY | Freq: Four times a day (QID) | RESPIRATORY_TRACT | Status: DC | PRN

## 2014-10-09 MED ORDER — METFORMIN HCL 500 MG PO TABS
500.0000 mg | ORAL_TABLET | Freq: Two times a day (BID) | ORAL | Status: DC
  Administered 2014-10-09 – 2014-10-12 (×7): 500 mg via ORAL
  Filled 2014-10-09 (×8): qty 1

## 2014-10-09 MED ORDER — AMLODIPINE BESYLATE 5 MG PO TABS
5.0000 mg | ORAL_TABLET | Freq: Every day | ORAL | Status: DC
  Administered 2014-10-09 – 2014-10-12 (×2): 5 mg via ORAL
  Filled 2014-10-09 (×4): qty 1

## 2014-10-09 MED ORDER — ALPRAZOLAM 0.5 MG PO TABS
0.5000 mg | ORAL_TABLET | Freq: Three times a day (TID) | ORAL | Status: DC | PRN
Start: 2014-10-09 — End: 2014-10-12
  Administered 2014-10-09 – 2014-10-10 (×2): 0.5 mg via ORAL
  Filled 2014-10-09 (×2): qty 1

## 2014-10-09 MED ORDER — BENZONATATE 200 MG PO CAPS
200.0000 mg | ORAL_CAPSULE | Freq: Three times a day (TID) | ORAL | Status: DC | PRN
Start: 2014-10-09 — End: 2015-05-27

## 2014-10-09 MED ORDER — INFLUENZA VAC SPLIT QUAD 0.5 ML IM SUSY
0.5000 mL | PREFILLED_SYRINGE | INTRAMUSCULAR | Status: DC
  Filled 2014-10-09 (×2): qty 0.5

## 2014-10-09 MED ORDER — SODIUM CHLORIDE 0.9 % IV SOLN
INTRAVENOUS | Status: DC
  Administered 2014-10-09 – 2014-10-12 (×3): via INTRAVENOUS

## 2014-10-09 MED ORDER — TIOTROPIUM BROMIDE MONOHYDRATE 18 MCG IN CAPS
18.0000 ug | ORAL_CAPSULE | Freq: Every day | RESPIRATORY_TRACT | Status: DC
  Administered 2014-10-10 – 2014-10-12 (×3): 18 ug via RESPIRATORY_TRACT
  Filled 2014-10-09: qty 5

## 2014-10-09 MED ORDER — LEVOFLOXACIN IN D5W 750 MG/150ML IV SOLN
750.0000 mg | INTRAVENOUS | Status: DC
  Administered 2014-10-09 – 2014-10-11 (×3): 750 mg via INTRAVENOUS
  Filled 2014-10-09 (×5): qty 150

## 2014-10-09 MED ORDER — AZITHROMYCIN 250 MG PO TABS: 250.0000 mg | ORAL_TABLET | Freq: Every day | ORAL | Status: DC

## 2014-10-09 MED ORDER — IPRATROPIUM-ALBUTEROL 0.5-2.5 (3) MG/3ML IN SOLN: 3.0000 mL | RESPIRATORY_TRACT | Status: DC | PRN

## 2014-10-09 MED ORDER — METHYLPREDNISOLONE SODIUM SUCC 125 MG IJ SOLR
125.0000 mg | Freq: Once | INTRAMUSCULAR | Status: AC
  Administered 2014-10-09: 125 mg via INTRAVENOUS
  Filled 2014-10-09 (×2): qty 2

## 2014-10-09 NOTE — Progress Notes (Signed)
Clinical Social Work Department BRIEF PSYCHOSOCIAL ASSESSMENT 10/09/2014  Patient:  Victoria Bird, Victoria Bird     Account Number:  [id number]     [place] date:  10/08/2014  Clinical Social Worker:  Lacie Scotts  Date/Time:  10/09/2014 02:58 PM  Referred by:  Physician  Date Referred:  10/09/2014 Referred for  Other - See comment   Other Referral:   Interview type:  Patient Other interview type:    PSYCHOSOCIAL DATA Living Status:  FAMILY Admitted from facility:   Level of care:   Primary support name:  Edmonia Lynch Primary support relationship to patient:  CHILD, ADULT Degree of support available:   minimal    CURRENT CONCERNS Current Concerns  Financial Resources   Other Concerns:    SOCIAL WORK ASSESSMENT / PLAN Pt is a 69 yr old female living at home. Her adult children and grandchildren live with her. CSW consulted to assist with financial resources. CSW met with pt and resources provided. Pt is attempting to support members of her household and her adult daughter living in [place]. Pt reports that she receives SS check monthly and has not been able to find employment.   Assessment/plan status:  No Further Intervention Required Other assessment/ plan:   Information/referral to community resources:   Building surveyor resources provided. DSS info provided.  [facility]. Provided.   PATIENT'S/FAMILY'S RESPONSE TO PLAN OF CARE: Pt has many financial concerns. Resources and support provided.Pt is motivated to find employment. " I'm a strong person. I won't give up. "    Werner Lean LCSW [phone]

## 2014-10-09 NOTE — ED Notes (Signed)
Pt reports cough that began at the end of November 2015 - states the cough is intermittently productive - pt unsure of any fever at home as she does not have a thermometer. Admits to hx of asthma, COPD and a current every day smoker. Pt speaking complete sentences, continuous coughing during exam.

## 2014-10-09 NOTE — ED Notes (Signed)
MD Onie At bedside to re-evaluate

## 2014-10-09 NOTE — Care Management Note (Signed)
    Page 1 of 1   10/09/2014     3:11:31 PM CARE MANAGEMENT NOTE 10/09/2014  Patient:  Victoria Bird, Victoria Bird   Account Number:  [id number]  Date Initiated:  10/09/2014  Documentation initiated by:  Sunday Spillers  Subjective/Objective Assessment:   69 yo female admitted with COPD. PTA lived at home with daughter.     Action/Plan:   Home when stable   Anticipated DC Date:  10/11/2014   Anticipated DC Plan:  HOME/SELF CARE  In-house referral  Clinical Social Worker  [place] [facility]  Medication Assistance  CM consult      Choice offered to / List presented to:             Status of service:  Completed, signed off Medicare Important Message given?   (If response is "NO", the following Medicare IM given date fields will be blank) Date Medicare IM given:   Medicare IM given by:   Date Additional Medicare IM given:   Additional Medicare IM given by:    Discharge Disposition:  HOME/SELF CARE  Per UR Regulation:  Reviewed for med. necessity/level of care/duration of stay  If discussed at [place] of Stay Meetings, dates discussed:    Comments:  10-09-13 Sunday Spillers RN CM 1400 Spoke with patient at bedside. 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 States she is worried that her power may be terminated at her house while she is in the hospital. She indicated that 5 people currently live with her in addition to maintaining another home with family. She has contacted DSS and SS adminitration who has helped some. When I spoke with her she had already been given resources from [place]. Discussed ways to relieve some of her burden and allow others to manage themselves, she was unwilling at this time to let that go. She was tearful by the end of our conversation. Ask if she would like to speak with the chaplain regarding her stressors and she agreed. Consult to [employer] for spiritual support. Unable to provide medication assistance due to insurance coverage  for meds currently, discussed ways to cut med cost (pharmacy shopping, certain abx free at [employer], generic vs brand, patient assistance programs). Patient was appreciative of assistance and information.

## 2014-10-09 NOTE — ED Notes (Signed)
Pt ambulated to restroom with steady gait.

## 2014-10-09 NOTE — H&P (Signed)
Hospitalist Admission History and Physical  Patient name: Victoria Bird Medical record number: [id number] Date of birth: 01-09-1946 Age: 69 y.o. Gender: female  Primary Care Provider: Linden Dolin, PA-C  Chief Complaint: bronchitis, COPD History of Present Illness:This is a 69 y.o. year old female with significant past medical history of COPD, type 2 DM, HTN presenting with COPD, bronchitis. Pt reports having cough and increased WOB since about thanksgiving. No fevers or chills. Still smoking despite worsening sxs up until 2 days ago. + change in sputum.  Hemodynamically stable on presentation, afebrile. Satting 97% on RA. CXR negative for infiltrate. Given CAT, mag, azithromycin. Still with cough. Not given steroid. Pt states that she is allergic. Reports bone pain w/ medicine. Is will to try one dose as long as pain medication is available. Labs are pending.   Assessment and Plan: Victoria Bird is a 69 y.o. year old female presenting with COPD exacerbation, bronchitis   Active Problems:   COPD exacerbation   COPD (chronic obstructive pulmonary disease)   1- COPD/Bronchitis -IV levaquin -duonebs  -IV solumedrol x1- pt is willing to try despite "allergy" (bone pain) as long as pain medication is available  -supplemental O2 prn  -quit smoking!!! 2- HTN -stable  -cont home regimen   3- Type 2 DM  -hold orals  -SSI  -A1C   FEN/GI: heart healthy/carb modified diet  Prophylaxis: sub q heparin  Disposition: pending further evaluation  Code Status:Full Code   Patient Active Problem List   Diagnosis Date Noted  . COPD (chronic obstructive pulmonary disease) 10/09/2014  . COPD exacerbation 04/10/2013  . Acute respiratory failure with hypoxia 04/10/2013  . CAP (community acquired pneumonia) 04/10/2013  . Diabetes mellitus 12/14/2011  . Hypertension 12/14/2011   Past Medical History: Past Medical History  Diagnosis Date  . Diabetes mellitus   . Hypertension    . COPD (chronic obstructive pulmonary disease)   . Bronchiectasis   . Chronic pain   . Anxiety   . Tracheobronchitis 01/01/2012    Past Surgical History: Past Surgical History  Procedure Laterality Date  . Appendectomy    . Cholecystectomy    . Abdominal hysterectomy    . Joint replacement    . Fracture surgery      Social History: History   Social History  . Marital Status: Divorced    Spouse Name: N/A    Number of Children: N/A  . Years of Education: N/A   Social History Main Topics  . Smoking status: Current Every Day Smoker -- 0.50 packs/day for 39 years    Types: Cigarettes  . Smokeless tobacco: Never Used  . Alcohol Use: No  . Drug Use: No  . Sexual Activity: No   Other Topics Concern  . None   Social History Narrative   Daughter  [phone]          Family History: Family History  Problem Relation Age of Onset  . Heart attack Mother     Allergies: Allergies  Allergen Reactions  . Aspartame And Phenylalanine Nausea And Vomiting    Patient says she is allergic to all artificial sweeteners  . Tramadol Shortness Of Breath and Nausea Only  . Codeine Itching  . Penicillins Other (See Comments)    Rash and joint pain  . Prednisone Other (See Comments)    Unknown   . Sulfa Antibiotics Other (See Comments)    Kidney problem     Current Facility-Administered Medications  Medication Dose Route Frequency Provider  Last Rate Last Dose  . 0.9 %  sodium chloride infusion   Intravenous Continuous [name], MD      . albuterol (PROVENTIL,VENTOLIN) solution continuous neb  10 mg/hr Nebulization Continuous Everlene Balls, MD   Stopped at 10/09/14 0049  . amLODipine (NORVASC) tablet 5 mg  5 mg Oral Daily [name], MD      . heparin injection 5,000 Units  5,000 Units Subcutaneous 3 times per day [name], MD      . insulin aspart (novoLOG) injection 0-9 Units  0-9 Units Subcutaneous 6 times per day [name], MD      . ipratropium-albuterol  (DUONEB) 0.5-2.5 (3) MG/3ML nebulizer solution 3 mL  3 mL Nebulization Q4H [name], MD      . ipratropium-albuterol (DUONEB) 0.5-2.5 (3) MG/3ML nebulizer solution 3 mL  3 mL Nebulization Q2H PRN [name], MD      . levofloxacin (LEVAQUIN) IVPB 750 mg  750 mg Intravenous Q24H [name], MD      . methylPREDNISolone sodium succinate (SOLU-MEDROL) 125 mg/2 mL injection 125 mg  125 mg Intravenous Once [name], MD       Current Outpatient Prescriptions  Medication Sig Dispense Refill  . albuterol (PROVENTIL HFA;VENTOLIN HFA) 108 (90 BASE) MCG/ACT inhaler Inhale 2 puffs into the lungs every 6 (six) hours as needed. For shortness of breath. 1 Inhaler 1  . albuterol (PROVENTIL) (5 MG/ML) 0.5% nebulizer solution Take 0.5 mLs (2.5 mg total) by nebulization every 4 (four) hours as needed for wheezing or shortness of breath. 20 mL 12  . ALPRAZolam (XANAX) 0.5 MG tablet Take 0.5 mg by mouth 3 (three) times daily as needed for anxiety.    Marland Kitchen amLODipine (NORVASC) 5 MG tablet Take 1 tablet (5 mg total) by mouth daily. 30 tablet 0  . Chlorpheniramine-APAP (CORICIDIN) 2-325 MG TABS Take 1 tablet by mouth every 4 (four) hours.    Marland Kitchen guaiFENesin (MUCINEX) 600 MG 12 hr tablet Take 600 mg by mouth 2 (two) times daily.    Marland Kitchen ipratropium (ATROVENT) 0.02 % nebulizer solution Take 2.5 mLs (0.5 mg total) by nebulization every 4 (four) hours as needed for wheezing. 75 mL 12  . metFORMIN (GLUCOPHAGE) 500 MG tablet Take 500 mg by mouth 2 (two) times daily with a meal.     . tiotropium (SPIRIVA) 18 MCG inhalation capsule Place 1 capsule (18 mcg total) into inhaler and inhale daily. 30 capsule 0  . azithromycin (ZITHROMAX) 250 MG tablet Take 1 tablet (250 mg total) by mouth daily. Take first 2 tablets together, then 1 every day until finished. 4 tablet 0  . benzonatate (TESSALON) 200 MG capsule Take 1 capsule (200 mg total) by mouth 3 (three) times daily as needed for cough. 20 capsule 0   Review Of Systems: 12  point ROS negative except as noted above in HPI.  Physical Exam: Filed Vitals:   10/09/14 0250  BP: 183/56  Pulse: 110  Temp:   Resp: 22    General: alert, cooperative and no cough, increased WOB while observed sleeping > 5 mins. + coughing upon waking  HEENT: PERRLA and extra ocular movement intact Heart: S1, S2 normal, no murmur, rub or gallop, regular rate and rhythm Lungs: unlabored breathing and expiratory wheezes Abdomen: abdomen is soft without significant tenderness, masses, organomegaly or guarding Extremities: extremities normal, atraumatic, no cyanosis or edema Skin:no rashes, no ecchymoses Neurology: normal without focal findings  Labs and Imaging:Dg Chest 2 View  10/08/2014  CLINICAL DATA:  Cough for over 1 month. Cough becoming worse and productive over the past few days. Shortness of breath tonight.  EXAM: CHEST  2 VIEW  COMPARISON:  04/09/2013  FINDINGS: Pulmonary hyperinflation. Normal heart size and pulmonary vascularity. No focal airspace disease or consolidation in the lungs. No blunting of costophrenic angles. Slight scarring in the right lung base some of the prior study. No pneumothorax. Mediastinal contours appear intact. Surgical clips in the right upper quadrant. Degenerative changes in the thoracic spine.  IMPRESSION: Pulmonary hyperinflation. Scarring in the right lung base. No evidence of active pulmonary disease.   Electronically Signed   By: Lucienne Capers M.D.   On: 10/08/2014 22:15           [name] MD  Pager: [phone]

## 2014-10-09 NOTE — Discharge Instructions (Signed)
Chronic Obstructive Pulmonary Disease Exacerbation Victoria Bird, you were seen today for COPD.  Continue to take antibiotics for treatment and use your rescue inhaler every 4 hours for the next 2 days.  Then you can use it as needed.  Take cough medicine as prescribed and follow up with your primary physician within 3 days for continued management.  If symptoms worsen, come back to the ED immediately.  Thank you.  Chronic obstructive pulmonary disease (COPD) is a common lung problem. In COPD, the flow of air from the lungs is limited. COPD exacerbations are times that breathing gets worse and you need extra treatment. Without treatment they can be life threatening. If they happen often, your lungs can become more damaged. HOME CARE  Do not smoke.  Avoid tobacco smoke and other things that bother your lungs.  If given, take your antibiotic medicine as told. Finish the medicine even if you start to feel better.  Only take medicines as told by your doctor.  Drink enough fluids to keep your pee (urine) clear or pale yellow (unless your doctor has told you not to).  Use a cool mist machine (vaporizer).  If you use oxygen or a machine that turns liquid medicine into a mist (nebulizer), continue to use them as told.  Keep up with shots (vaccinations) as told by your doctor.  Exercise regularly.  Eat healthy foods.  Keep all doctor visits as told. GET HELP RIGHT AWAY IF:  You are very short of breath and it gets worse.  You have trouble talking.  You have bad chest pain.  You have blood in your spit (sputum).  You have a fever.  You keep throwing up (vomiting).  You feel weak, or you pass out (faint).  You feel confused.  You keep getting worse. MAKE SURE YOU:   Understand these instructions.  Will watch your condition.  Will get help right away if you are not doing well or get worse. Document Released: 09/07/2011 Document Revised: 07/09/2013 Document Reviewed:  05/23/2013 [facility] Patient Information 2015 [place], [place]. This information is not intended to replace advice given to you by your health care provider. Make sure you discuss any questions you have with your health care provider.

## 2014-10-09 NOTE — Progress Notes (Signed)
[facility] PROGRESS NOTE   Assessment/Plan: COPD (chronic obstructive pulmonary disease)/ COPD exacerbation - IV levaquin - duonebs  - IV solumedrol, resume home meds.  UnControlled DM:  - refused insulin. - restart metformin.  Code Status: full Family Communication: non  Disposition Plan: inpatient   Consultants:  none  Procedures:  CXR  Antibiotics:  levaquin  HPI/Subjective: No complains  Objective: Filed Vitals:   10/09/14 0439 10/09/14 0600 10/09/14 0754 10/09/14 1102  BP: 161/49 124/75    Pulse: 92 88    Temp: 99.1 F (37.3 C) 98 F (36.7 C)    TempSrc: Oral Oral    Resp: 20 20    Height: 5\' 4"  (1.626 m)     Weight: 74.1 kg (163 lb 5.8 oz)     SpO2: 96% 99% 92% 92%    Intake/Output Summary (Last 24 hours) at 10/09/14 1151 Last data filed at 10/09/14 1000  Gross per 24 hour  Intake    610 ml  Output    750 ml  Net   -140 ml   Filed Weights   10/09/14 0439  Weight: 74.1 kg (163 lb 5.8 oz)    Exam:  General: Alert, awake, oriented x3, in no acute distress.  HEENT: No bruits, no goiter.  Heart: Regular rate and rhythm, without murmurs, rubs, gallops.  Lungs: Good air movement,wheezing B/L  Abdomen: Soft, nontender, nondistended, positive bowel sounds.    Data Reviewed: Basic Metabolic Panel:  Recent Labs Lab 10/09/14 0356  NA 141  K 3.4*  CL 109  CO2 23  GLUCOSE 120*  BUN 8  CREATININE 0.99  CALCIUM 8.1*   Liver Function Tests:  Recent Labs Lab 10/09/14 0356  AST 24  ALT 17  ALKPHOS 68  BILITOT 0.5  PROT 6.2  ALBUMIN 3.5   No results for input(s): LIPASE, AMYLASE in the last 168 hours. No results for input(s): AMMONIA in the last 168 hours. CBC:  Recent Labs Lab 10/09/14 0356  WBC 9.2  NEUTROABS 6.6  HGB 13.9  HCT 40.6  MCV 89.0  PLT 151   Cardiac Enzymes: No results for input(s): CKTOTAL, CKMB, CKMBINDEX, TROPONINI in the last 168 hours. BNP (last 3 results) No results for input(s):  PROBNP in the last 8760 hours. CBG:  Recent Labs Lab 10/09/14 0414 10/09/14 0825  GLUCAP 104* 189*    No results found for this or any previous visit (from the past 240 hour(s)).   Studies: Dg Chest 2 View  10/08/2014   CLINICAL DATA:  Cough for over 1 month. Cough becoming worse and productive over the past few days. Shortness of breath tonight.  EXAM: CHEST  2 VIEW  COMPARISON:  04/09/2013  FINDINGS: Pulmonary hyperinflation. Normal heart size and pulmonary vascularity. No focal airspace disease or consolidation in the lungs. No blunting of costophrenic angles. Slight scarring in the right lung base some of the prior study. No pneumothorax. Mediastinal contours appear intact. Surgical clips in the right upper quadrant. Degenerative changes in the thoracic spine.  IMPRESSION: Pulmonary hyperinflation. Scarring in the right lung base. No evidence of active pulmonary disease.   Electronically Signed   By: Lucienne Capers M.D.   On: 10/08/2014 22:15    Scheduled Meds: . amLODipine  5 mg Oral Daily  . benzonatate  100 mg Oral TID  . heparin  5,000 Units Subcutaneous 3 times per day  . [START ON 10/10/2014] Influenza vac split quadrivalent PF  0.5 mL Intramuscular Tomorrow-1000  . insulin aspart  0-9 Units Subcutaneous 6 times per day  . ipratropium-albuterol  3 mL Nebulization TID  . levofloxacin (LEVAQUIN) IV  750 mg Intravenous Q24H   Continuous Infusions: . sodium chloride 75 mL/hr at 10/09/14 0416  . albuterol Stopped (10/09/14 0049)     Charlynne Cousins  [facility] Pager [phone]. If 8PM-8AM, please contact night-coverage at [email or web address], password [name] 10/09/2014, 11:51 AM  LOS: 1 day

## 2014-10-09 NOTE — Progress Notes (Signed)
Nutrition Brief Note  Patient identified on the Malnutrition Screening Tool (MST) Report  Pt with no recent weight loss. Pt states that she is eating okay now. Reports controlling her diabetes with diet and is happy to have regular diet ordered. Pt is refusing her insulin.  Pt states she only eats 1 meal/day PTA. Encouraged more consistent meal consumption. Pt would like to receive Glucerna shakes. RD will order daily.  Wt Readings from Last 15 Encounters:  10/09/14 163 lb 5.8 oz (74.1 kg)  06/13/14 161 lb (73.029 kg)  04/10/13 156 lb 12 oz (71.1 kg)  01/08/13 162 lb 6.4 oz (73.664 kg)  12/29/11 174 lb 4.8 oz (79.062 kg)  12/14/11 165 lb 14.4 oz (75.252 kg)    Body mass index is 28.03 kg/(m^2). Patient meets criteria for overweight based on current BMI.   Current diet order is regular diet, patient is consuming approximately 75% of meals at this time. Labs and medications reviewed.   No further nutrition interventions warranted at this time. If further nutrition issues arise, please consult RD.   Clayton Bibles, MS, RD, LDN Pager: [phone] After Hours Pager: [phone]

## 2014-10-09 NOTE — Progress Notes (Signed)
UR completed 

## 2014-10-10 DIAGNOSIS — F17210 Nicotine dependence, cigarettes, uncomplicated: Secondary | ICD-10-CM | POA: Diagnosis present

## 2014-10-10 DIAGNOSIS — Z880 Allergy status to penicillin: Secondary | ICD-10-CM | POA: Diagnosis not present

## 2014-10-10 DIAGNOSIS — J438 Other emphysema: Secondary | ICD-10-CM | POA: Diagnosis not present

## 2014-10-10 DIAGNOSIS — R05 Cough: Secondary | ICD-10-CM | POA: Diagnosis not present

## 2014-10-10 DIAGNOSIS — F419 Anxiety disorder, unspecified: Secondary | ICD-10-CM | POA: Diagnosis present

## 2014-10-10 DIAGNOSIS — I959 Hypotension, unspecified: Secondary | ICD-10-CM | POA: Diagnosis present

## 2014-10-10 DIAGNOSIS — Z90710 Acquired absence of both cervix and uterus: Secondary | ICD-10-CM | POA: Diagnosis not present

## 2014-10-10 DIAGNOSIS — I10 Essential (primary) hypertension: Secondary | ICD-10-CM | POA: Diagnosis present

## 2014-10-10 DIAGNOSIS — E1165 Type 2 diabetes mellitus with hyperglycemia: Secondary | ICD-10-CM | POA: Diagnosis present

## 2014-10-10 DIAGNOSIS — G8929 Other chronic pain: Secondary | ICD-10-CM | POA: Diagnosis present

## 2014-10-10 DIAGNOSIS — Z882 Allergy status to sulfonamides status: Secondary | ICD-10-CM | POA: Diagnosis not present

## 2014-10-10 DIAGNOSIS — M549 Dorsalgia, unspecified: Secondary | ICD-10-CM | POA: Diagnosis present

## 2014-10-10 DIAGNOSIS — Z794 Long term (current) use of insulin: Secondary | ICD-10-CM | POA: Diagnosis not present

## 2014-10-10 DIAGNOSIS — Z7901 Long term (current) use of anticoagulants: Secondary | ICD-10-CM | POA: Diagnosis not present

## 2014-10-10 DIAGNOSIS — J441 Chronic obstructive pulmonary disease with (acute) exacerbation: Principal | ICD-10-CM

## 2014-10-10 DIAGNOSIS — J40 Bronchitis, not specified as acute or chronic: Secondary | ICD-10-CM | POA: Diagnosis present

## 2014-10-10 DIAGNOSIS — Z885 Allergy status to narcotic agent status: Secondary | ICD-10-CM | POA: Diagnosis not present

## 2014-10-10 DIAGNOSIS — Z9049 Acquired absence of other specified parts of digestive tract: Secondary | ICD-10-CM | POA: Diagnosis present

## 2014-10-10 LAB — COMPREHENSIVE METABOLIC PANEL
ALK PHOS: 58 U/L (ref 39–117)
ALT: 15 U/L (ref 0–35)
AST: 20 U/L (ref 0–37)
Albumin: 3.3 g/dL — ABNORMAL LOW (ref 3.5–5.2)
Anion gap: 9 (ref 5–15)
BILIRUBIN TOTAL: 0.6 mg/dL (ref 0.3–1.2)
BUN: 18 mg/dL (ref 6–23)
CHLORIDE: 109 meq/L (ref 96–112)
CO2: 22 mmol/L (ref 19–32)
Calcium: 8.5 mg/dL (ref 8.4–10.5)
Creatinine, Ser: 1.13 mg/dL — ABNORMAL HIGH (ref 0.50–1.10)
GFR calc non Af Amer: 49 mL/min — ABNORMAL LOW (ref >90)
GFR, EST AFRICAN AMERICAN: 57 mL/min — AB (ref >90)
GLUCOSE: 137 mg/dL — AB (ref 70–99)
Potassium: 4.4 mmol/L (ref 3.5–5.1)
Sodium: 140 mmol/L (ref 135–145)
Total Protein: 5.6 g/dL — ABNORMAL LOW (ref 6.0–8.3)

## 2014-10-10 LAB — GLUCOSE, CAPILLARY
GLUCOSE-CAPILLARY: 246 mg/dL — AB (ref 70–99)
GLUCOSE-CAPILLARY: 95 mg/dL (ref 70–99)
GLUCOSE-CAPILLARY: 114 mg/dL — AB (ref 70–99)
Glucose-Capillary: 136 mg/dL — ABNORMAL HIGH (ref 70–99)
Glucose-Capillary: 178 mg/dL — ABNORMAL HIGH (ref 70–99)
Glucose-Capillary: 154 mg/dL — ABNORMAL HIGH (ref 70–99)

## 2014-10-10 MED ORDER — GUAIFENESIN ER 600 MG PO TB12
600.0000 mg | ORAL_TABLET | Freq: Two times a day (BID) | ORAL | Status: DC
  Administered 2014-10-10 – 2014-10-12 (×5): 600 mg via ORAL
  Filled 2014-10-10 (×7): qty 1

## 2014-10-10 MED ORDER — INFLUENZA VAC SPLIT QUAD 0.5 ML IM SUSY: 0.5000 mL | PREFILLED_SYRINGE | INTRAMUSCULAR | Status: DC | PRN

## 2014-10-10 MED ORDER — HYDROCODONE-ACETAMINOPHEN 5-325 MG PO TABS
1.0000 | ORAL_TABLET | ORAL | Status: DC | PRN
  Administered 2014-10-10: 2 via ORAL
  Administered 2014-10-11 – 2014-10-12 (×5): 1 via ORAL
  Filled 2014-10-10 (×2): qty 1
  Filled 2014-10-10: qty 2
  Filled 2014-10-10 (×3): qty 1

## 2014-10-10 MED ORDER — ZOLPIDEM TARTRATE 5 MG PO TABS: 5.0000 mg | ORAL_TABLET | Freq: Every evening | ORAL | Status: DC | PRN

## 2014-10-10 NOTE — Progress Notes (Signed)
Utilization Review completed.  

## 2014-10-10 NOTE — Progress Notes (Signed)
[facility] PROGRESS NOTE   Assessment/Plan: COPD (chronic obstructive pulmonary disease)/ COPD exacerbation - On admission start on IV levaquin, steroids and inhalers. - cont benzoate.  UnControlled DM:  - refused insulin. - restart metformin.  Code Status: full Family Communication: non  Disposition Plan: inpatient   Consultants:  none  Procedures:  CXR  Antibiotics:  levaquin  HPI/Subjective: Persistent cough  Objective: Filed Vitals:   10/09/14 2120 10/10/14 0545 10/10/14 0734 10/10/14 0958  BP: 137/79 143/62  142/74  Pulse: 84 89    Temp: 98.1 F (36.7 C) 97.8 F (36.6 C)    TempSrc: Oral Oral    Resp: 18 16    Height:      Weight:      SpO2: 93% 93% 94%     Intake/Output Summary (Last 24 hours) at 10/10/14 1104 Last data filed at 10/10/14 1019  Gross per 24 hour  Intake   2700 ml  Output   2800 ml  Net   -100 ml   Filed Weights   10/09/14 0439  Weight: 74.1 kg (163 lb 5.8 oz)    Exam:  General: Alert, awake, oriented x3, in no acute distress.  HEENT: No bruits, no goiter.  Heart: Regular rate and rhythm, without murmurs, rubs, gallops.  Lungs: Good air movement,wheezing B/L  Abdomen: Soft, nontender, nondistended, positive bowel sounds.    Data Reviewed: Basic Metabolic Panel:  Recent Labs Lab 10/09/14 0356 10/10/14 0625  NA 141 140  K 3.4* 4.4  CL 109 109  CO2 23 22  GLUCOSE 120* 137*  BUN 8 18  CREATININE 0.99 1.13*  CALCIUM 8.1* 8.5   Liver Function Tests:  Recent Labs Lab 10/09/14 0356 10/10/14 0625  AST 24 20  ALT 17 15  ALKPHOS 68 58  BILITOT 0.5 0.6  PROT 6.2 5.6*  ALBUMIN 3.5 3.3*   No results for input(s): LIPASE, AMYLASE in the last 168 hours. No results for input(s): AMMONIA in the last 168 hours. CBC:  Recent Labs Lab 10/09/14 0356  WBC 9.2  NEUTROABS 6.6  HGB 13.9  HCT 40.6  MCV 89.0  PLT 151   Cardiac Enzymes: No results for input(s): CKTOTAL, CKMB, CKMBINDEX, TROPONINI in  the last 168 hours. BNP (last 3 results) No results for input(s): PROBNP in the last 8760 hours. CBG:  Recent Labs Lab 10/09/14 1626 10/09/14 2019 10/09/14 2321 10/10/14 0355 10/10/14 0752  GLUCAP 246* 201* 110* 136* 178*    No results found for this or any previous visit (from the past 240 hour(s)).   Studies: Dg Chest 2 View  10/08/2014   CLINICAL DATA:  Cough for over 1 month. Cough becoming worse and productive over the past few days. Shortness of breath tonight.  EXAM: CHEST  2 VIEW  COMPARISON:  04/09/2013  FINDINGS: Pulmonary hyperinflation. Normal heart size and pulmonary vascularity. No focal airspace disease or consolidation in the lungs. No blunting of costophrenic angles. Slight scarring in the right lung base some of the prior study. No pneumothorax. Mediastinal contours appear intact. Surgical clips in the right upper quadrant. Degenerative changes in the thoracic spine.  IMPRESSION: Pulmonary hyperinflation. Scarring in the right lung base. No evidence of active pulmonary disease.   Electronically Signed   By: Lucienne Capers M.D.   On: 10/08/2014 22:15    Scheduled Meds: . amLODipine  5 mg Oral Daily  . benzonatate  100 mg Oral TID  . feeding supplement (GLUCERNA SHAKE)  237 mL Oral Q24H  .  heparin  5,000 Units Subcutaneous 3 times per day  . insulin aspart  0-9 Units Subcutaneous 6 times per day  . ipratropium-albuterol  3 mL Nebulization TID  . levofloxacin (LEVAQUIN) IV  750 mg Intravenous Q24H  . metFORMIN  500 mg Oral BID WC  . tiotropium  18 mcg Inhalation Daily   Continuous Infusions: . sodium chloride 75 mL/hr at 10/09/14 0416  . albuterol Stopped (10/09/14 0049)     Charlynne Cousins  [facility] Pager [phone]. If 8PM-8AM, please contact night-coverage at [email or web address], password [name] 10/10/2014, 11:04 AM  LOS: 2 days

## 2014-10-11 LAB — COMPREHENSIVE METABOLIC PANEL
ALT: 17 U/L (ref 0–35)
ANION GAP: 5 (ref 5–15)
AST: 20 U/L (ref 0–37)
Albumin: 3 g/dL — ABNORMAL LOW (ref 3.5–5.2)
Alkaline Phosphatase: 54 U/L (ref 39–117)
BUN: 24 mg/dL — AB (ref 6–23)
CALCIUM: 8 mg/dL — AB (ref 8.4–10.5)
CO2: 26 mmol/L (ref 19–32)
CREATININE: 1.18 mg/dL — AB (ref 0.50–1.10)
Chloride: 109 mEq/L (ref 96–112)
GFR calc Af Amer: 54 mL/min — ABNORMAL LOW (ref >90)
GFR, EST NON AFRICAN AMERICAN: 47 mL/min — AB (ref >90)
GLUCOSE: 138 mg/dL — AB (ref 70–99)
POTASSIUM: 3.6 mmol/L (ref 3.5–5.1)
SODIUM: 140 mmol/L (ref 135–145)
Total Bilirubin: 0.7 mg/dL (ref 0.3–1.2)
Total Protein: 5.5 g/dL — ABNORMAL LOW (ref 6.0–8.3)

## 2014-10-11 LAB — GLUCOSE, CAPILLARY
GLUCOSE-CAPILLARY: 117 mg/dL — AB (ref 70–99)
Glucose-Capillary: 130 mg/dL — ABNORMAL HIGH (ref 70–99)
Glucose-Capillary: 101 mg/dL — ABNORMAL HIGH (ref 70–99)
Glucose-Capillary: 102 mg/dL — ABNORMAL HIGH (ref 70–99)
Glucose-Capillary: 90 mg/dL (ref 70–99)
Glucose-Capillary: 128 mg/dL — ABNORMAL HIGH (ref 70–99)

## 2014-10-11 MED ORDER — PREDNISONE 50 MG PO TABS
50.0000 mg | ORAL_TABLET | Freq: Every day | ORAL | Status: DC
  Filled 2014-10-11 (×2): qty 1

## 2014-10-11 MED ORDER — HYDROCHLOROTHIAZIDE 12.5 MG PO CAPS
12.5000 mg | ORAL_CAPSULE | Freq: Every day | ORAL | Status: DC
  Administered 2014-10-11: 12.5 mg via ORAL
  Filled 2014-10-11 (×2): qty 1

## 2014-10-11 MED ORDER — LOSARTAN POTASSIUM-HCTZ 50-12.5 MG PO TABS
1.0000 | ORAL_TABLET | Freq: Every day | ORAL | Status: DC
Start: 2014-10-11 — End: 2014-10-11

## 2014-10-11 MED ORDER — PREDNISONE 50 MG PO TABS: 50.0000 mg | ORAL_TABLET | Freq: Every day | ORAL | Status: DC

## 2014-10-11 MED ORDER — METHYLPREDNISOLONE 16 MG PO TABS
40.0000 mg | ORAL_TABLET | Freq: Every day | ORAL | Status: DC
  Administered 2014-10-11: 40 mg via ORAL
  Filled 2014-10-11 (×3): qty 3

## 2014-10-11 MED ORDER — LOSARTAN POTASSIUM 50 MG PO TABS
50.0000 mg | ORAL_TABLET | Freq: Every day | ORAL | Status: DC
  Administered 2014-10-11: 50 mg via ORAL
  Filled 2014-10-11 (×2): qty 1

## 2014-10-11 MED ORDER — LEVOFLOXACIN 500 MG PO TABS
500.0000 mg | ORAL_TABLET | Freq: Every day | ORAL | Status: DC
  Administered 2014-10-11 – 2014-10-12 (×2): 500 mg via ORAL
  Filled 2014-10-11 (×2): qty 1

## 2014-10-11 NOTE — Plan of Care (Signed)
Problem: Phase I Progression Outcomes Goal: Flu/PneumoVaccines if indicated Outcome: Progressing Pt up to date with pneumonia vaccine and wants to wait til at least day of discharge to take influenza vaccine.

## 2014-10-11 NOTE — Progress Notes (Signed)
[facility] PROGRESS NOTE   Assessment/Plan: COPD (chronic obstructive pulmonary disease)/ COPD exacerbation - Change steroids and antibiotics to orals. - cont benzoate. - home in am if cont to improved. Wheezing is resolved.  UnControlled DM:  - refused insulin. - restart metformin.  Code Status: full Family Communication: non  Disposition Plan: inpatient   Consultants:  none  Procedures:  CXR  Antibiotics:  levaquin  HPI/Subjective: Persistent cough  Objective: Filed Vitals:   10/10/14 2021 10/10/14 2214 10/11/14 0529 10/11/14 0735  BP:  112/59 150/59   Pulse:  79 80   Temp:  98.4 F (36.9 C) 98.5 F (36.9 C)   TempSrc:  Oral Oral   Resp:  16 16   Height:      Weight:      SpO2: 91% 93% 95% 96%    Intake/Output Summary (Last 24 hours) at 10/11/14 1032 Last data filed at 10/11/14 0951  Gross per 24 hour  Intake   2100 ml  Output   2100 ml  Net      0 ml   Filed Weights   10/09/14 0439  Weight: 74.1 kg (163 lb 5.8 oz)    Exam:  General: Alert, awake, oriented x3, in no acute distress.  HEENT: No bruits, no goiter.  Lungs: Good air movement,clear Abdomen: Soft, nontender, nondistended, positive bowel sounds.    Data Reviewed: Basic Metabolic Panel:  Recent Labs Lab 10/09/14 0356 10/10/14 0625 10/11/14 0545  NA 141 140 140  K 3.4* 4.4 3.6  CL 109 109 109  CO2 23 22 26   GLUCOSE 120* 137* 138*  BUN 8 18 24*  CREATININE 0.99 1.13* 1.18*  CALCIUM 8.1* 8.5 8.0*   Liver Function Tests:  Recent Labs Lab 10/09/14 0356 10/10/14 0625 10/11/14 0545  AST 24 20 20   ALT 17 15 17   ALKPHOS 68 58 54  BILITOT 0.5 0.6 0.7  PROT 6.2 5.6* 5.5*  ALBUMIN 3.5 3.3* 3.0*   No results for input(s): LIPASE, AMYLASE in the last 168 hours. No results for input(s): AMMONIA in the last 168 hours. CBC:  Recent Labs Lab 10/09/14 0356  WBC 9.2  NEUTROABS 6.6  HGB 13.9  HCT 40.6  MCV 89.0  PLT 151   Cardiac Enzymes: No results  for input(s): CKTOTAL, CKMB, CKMBINDEX, TROPONINI in the last 168 hours. BNP (last 3 results) No results for input(s): PROBNP in the last 8760 hours. CBG:  Recent Labs Lab 10/10/14 1632 10/10/14 2006 10/11/14 0001 10/11/14 0425 10/11/14 0826  GLUCAP 154* 114* 130* 102* 101*    No results found for this or any previous visit (from the past 240 hour(s)).   Studies: No results found.  Scheduled Meds: . amLODipine  5 mg Oral Daily  . benzonatate  100 mg Oral TID  . feeding supplement (GLUCERNA SHAKE)  237 mL Oral Q24H  . guaiFENesin  600 mg Oral BID  . heparin  5,000 Units Subcutaneous 3 times per day  . insulin aspart  0-9 Units Subcutaneous 6 times per day  . ipratropium-albuterol  3 mL Nebulization TID  . levofloxacin (LEVAQUIN) IV  750 mg Intravenous Q24H  . losartan-hydrochlorothiazide  1 tablet Oral Daily  . metFORMIN  500 mg Oral BID WC  . predniSONE  50 mg Oral Q breakfast  . tiotropium  18 mcg Inhalation Daily   Continuous Infusions: . sodium chloride 75 mL/hr at 10/10/14 2129  . albuterol Stopped (10/09/14 0049)     Charlynne Cousins  [facility] Pager [phone]. If 8PM-8AM, please contact night-coverage at [email or web address], password [facility] 10/11/2014, 10:32 AM  LOS: 3 days

## 2014-10-12 DIAGNOSIS — J441 Chronic obstructive pulmonary disease with (acute) exacerbation: Secondary | ICD-10-CM | POA: Diagnosis not present

## 2014-10-12 DIAGNOSIS — J438 Other emphysema: Secondary | ICD-10-CM

## 2014-10-12 LAB — COMPREHENSIVE METABOLIC PANEL
ALT: 17 U/L (ref 0–35)
ANION GAP: 8 (ref 5–15)
AST: 17 U/L (ref 0–37)
Albumin: 3 g/dL — ABNORMAL LOW (ref 3.5–5.2)
Alkaline Phosphatase: 55 U/L (ref 39–117)
BILIRUBIN TOTAL: 0.4 mg/dL (ref 0.3–1.2)
BUN: 21 mg/dL (ref 6–23)
CHLORIDE: 107 meq/L (ref 96–112)
CO2: 23 mmol/L (ref 19–32)
Calcium: 8.7 mg/dL (ref 8.4–10.5)
Creatinine, Ser: 1.05 mg/dL (ref 0.50–1.10)
GFR calc non Af Amer: 54 mL/min — ABNORMAL LOW (ref >90)
GFR, EST AFRICAN AMERICAN: 62 mL/min — AB (ref >90)
GLUCOSE: 149 mg/dL — AB (ref 70–99)
POTASSIUM: 4.7 mmol/L (ref 3.5–5.1)
SODIUM: 138 mmol/L (ref 135–145)
TOTAL PROTEIN: 5.6 g/dL — AB (ref 6.0–8.3)

## 2014-10-12 LAB — GLUCOSE, CAPILLARY
GLUCOSE-CAPILLARY: 119 mg/dL — AB (ref 70–99)
Glucose-Capillary: 128 mg/dL — ABNORMAL HIGH (ref 70–99)
Glucose-Capillary: 155 mg/dL — ABNORMAL HIGH (ref 70–99)
Glucose-Capillary: 166 mg/dL — ABNORMAL HIGH (ref 70–99)

## 2014-10-12 MED ORDER — ONDANSETRON 4 MG PO TBDP: 4.0000 mg | ORAL_TABLET | Freq: Three times a day (TID) | ORAL | Status: DC | PRN

## 2014-10-12 MED ORDER — HYDROCODONE-ACETAMINOPHEN 5-325 MG PO TABS: 1.0000 | ORAL_TABLET | ORAL | Status: DC | PRN

## 2014-10-12 MED ORDER — METHYLPREDNISOLONE 8 MG PO TABS: 40.0000 mg | ORAL_TABLET | Freq: Every day | ORAL | Status: DC

## 2014-10-12 MED ORDER — PANTOPRAZOLE SODIUM 40 MG PO TBEC: 40.0000 mg | DELAYED_RELEASE_TABLET | Freq: Two times a day (BID) | ORAL | Status: DC

## 2014-10-12 MED ORDER — PANTOPRAZOLE SODIUM 40 MG PO TBEC
40.0000 mg | DELAYED_RELEASE_TABLET | Freq: Two times a day (BID) | ORAL | Status: DC
Start: 2014-10-12 — End: 2014-10-12
  Administered 2014-10-12: 40 mg via ORAL
  Filled 2014-10-12: qty 1

## 2014-10-12 MED ORDER — ONDANSETRON HCL 4 MG/2ML IJ SOLN
4.0000 mg | Freq: Three times a day (TID) | INTRAMUSCULAR | Status: DC | PRN
Start: 2014-10-12 — End: 2014-10-12

## 2014-10-12 MED ORDER — INFLUENZA VAC SPLIT QUAD 0.5 ML IM SUSY
0.5000 mL | PREFILLED_SYRINGE | Freq: Once | INTRAMUSCULAR | Status: AC
  Administered 2014-10-12: 0.5 mL via INTRAMUSCULAR
  Filled 2014-10-12: qty 0.5

## 2014-10-12 MED ORDER — ONDANSETRON HCL 4 MG/2ML IJ SOLN: 4.0000 mg | Freq: Three times a day (TID) | INTRAMUSCULAR | Status: DC | PRN

## 2014-10-12 MED ORDER — LEVOFLOXACIN 500 MG PO TABS: 500.0000 mg | ORAL_TABLET | Freq: Every day | ORAL | Status: DC

## 2014-10-12 MED ORDER — AMLODIPINE BESYLATE 5 MG PO TABS: 5.0000 mg | ORAL_TABLET | Freq: Every day | ORAL | Status: DC

## 2014-10-12 NOTE — Discharge Summary (Addendum)
Physician Discharge Summary  Victoria Bird JKK:[id number] DOB: [birth year] DOA: 10/08/2014  PCP: Linden Dolin, PA-C  Admit date: 10/08/2014 Discharge date: 10/12/2014  Time spent: 35 minutes  Recommendations for Outpatient Follow-up:  1. Follow up with PCP in 2 weeks.    Discharge Diagnoses:  Active Problems:   COPD exacerbation   COPD (chronic obstructive pulmonary disease)   Discharge Condition: stable  Diet recommendation: heart healrthy  Filed Weights   10/09/14 0439  Weight: 74.1 kg (163 lb 5.8 oz)    History of present illness:  69 y.o. year old female with significant past medical history of COPD, type 2 DM, HTN presenting with COPD, bronchitis. Pt reports having cough and increased WOB since about thanksgiving. No fevers or chills. Still smoking despite worsening sxs up until 2 days ago. + change in sputum.  Hemodynamically stable on presentation, afebrile. Satting 97% on RA. CXR negative for infiltrate. Given CAT, mag, azithromycin. Still with cough. Not given steroid. Pt states that she is allergic. Reports bone pain w/ medicine. Is will to try one dose as long as pain medication is available.   Hospital Course:  COPD (chronic obstructive pulmonary disease)/ COPD exacerbation - Started on IV steroids, inhalers and antibitoics. - she refused prednisone. Was give methylprednisolone orally which she tolerate it. - start on protonix and zofran oral. - cont benzoate.  UnControlled DM: - refused insulin. - restart metformin.   Procedures:  CXR  Consultations:  none  Discharge Exam: Filed Vitals:   10/12/14 1400  BP: 138/74  Pulse: 68  Temp: 98.6 F (37 C)  Resp: 18    General: A&O x3 Cardiovascular: RRR Respiratory: good air movement CTA B/L  Discharge Instructions   Discharge Instructions    Diet - low sodium heart healthy    Complete by:  As directed      Increase activity slowly    Complete by:  As directed           Current  Discharge Medication List    START taking these medications   Details  azithromycin (ZITHROMAX) 250 MG tablet Take 1 tablet (250 mg total) by mouth daily. Take first 2 tablets together, then 1 every day until finished. Qty: 4 tablet, Refills: 0    benzonatate (TESSALON) 200 MG capsule Take 1 capsule (200 mg total) by mouth 3 (three) times daily as needed for cough. Qty: 20 capsule, Refills: 0    HYDROcodone-acetaminophen (NORCO/VICODIN) 5-325 MG per tablet Take 1-2 tablets by mouth every 4 (four) hours as needed for moderate pain. Qty: 30 tablet, Refills: 0    levofloxacin (LEVAQUIN) 500 MG tablet Take 1 tablet (500 mg total) by mouth daily. Qty: 5 tablet, Refills: 0    methylPREDNISolone (MEDROL) 8 MG tablet Take 5 tablets (40 mg total) by mouth daily with breakfast. Qty: 4 tablet, Refills: 0    ondansetron (ZOFRAN ODT) 4 MG disintegrating tablet Take 1 tablet (4 mg total) by mouth every 8 (eight) hours as needed for nausea or vomiting. Qty: 20 tablet, Refills: 0    pantoprazole (PROTONIX) 40 MG tablet Take 1 tablet (40 mg total) by mouth 2 (two) times daily. Qty: 60 tablet, Refills: 0      CONTINUE these medications which have CHANGED   Details  amLODipine (NORVASC) 5 MG tablet Take 1 tablet (5 mg total) by mouth daily. Qty: 30 tablet, Refills: 0      CONTINUE these medications which have NOT CHANGED   Details  albuterol (PROVENTIL HFA;VENTOLIN  HFA) 108 (90 BASE) MCG/ACT inhaler Inhale 2 puffs into the lungs every 6 (six) hours as needed. For shortness of breath. Qty: 1 Inhaler, Refills: 1    albuterol (PROVENTIL) (5 MG/ML) 0.5% nebulizer solution Take 0.5 mLs (2.5 mg total) by nebulization every 4 (four) hours as needed for wheezing or shortness of breath. Qty: 20 mL, Refills: 12    ALPRAZolam (XANAX) 0.5 MG tablet Take 0.5 mg by mouth 3 (three) times daily as needed for anxiety.    Chlorpheniramine-APAP (CORICIDIN) 2-325 MG TABS Take 1 tablet by mouth every 4 (four)  hours.    guaiFENesin (MUCINEX) 600 MG 12 hr tablet Take 600 mg by mouth 2 (two) times daily.    ipratropium (ATROVENT) 0.02 % nebulizer solution Take 2.5 mLs (0.5 mg total) by nebulization every 4 (four) hours as needed for wheezing. Qty: 75 mL, Refills: 12    losartan-hydrochlorothiazide (HYZAAR) 50-12.5 MG per tablet Take 1 tablet by mouth daily.    metFORMIN (GLUCOPHAGE) 500 MG tablet Take 500 mg by mouth 2 (two) times daily with a meal.     tiotropium (SPIRIVA) 18 MCG inhalation capsule Place 1 capsule (18 mcg total) into inhaler and inhale daily. Qty: 30 capsule, Refills: 0       Allergies  Allergen Reactions  . Aspartame And Phenylalanine Nausea And Vomiting    Patient says she is allergic to all artificial sweeteners  . Tramadol Shortness Of Breath and Nausea Only  . Codeine Itching  . Penicillins Other (See Comments)    Rash and joint pain  . Sulfa Antibiotics Other (See Comments)    Kidney problem   . Prednisone Rash    Patient stated she received Prednisone while she was in the hospital and experienced a rash and "extreme pain.'   Follow-up Information    Follow up with Berton Lan A, PA-C. Schedule an appointment as soon as possible for a visit in 3 days.   Specialty:  Physician Assistant   Why:  for close follow up of your COPD and coughing   Contact information:   [address] [place] [address] [phone]        The results of significant diagnostics from this hospitalization (including imaging, microbiology, ancillary and laboratory) are listed below for reference.    Significant Diagnostic Studies: Dg Chest 2 View  10/08/2014   CLINICAL DATA:  Cough for over 1 month. Cough becoming worse and productive over the past few days. Shortness of breath tonight.  EXAM: CHEST  2 VIEW  COMPARISON:  04/09/2013  FINDINGS: Pulmonary hyperinflation. Normal heart size and pulmonary vascularity. No focal airspace disease or consolidation in the lungs. No  blunting of costophrenic angles. Slight scarring in the right lung base some of the prior study. No pneumothorax. Mediastinal contours appear intact. Surgical clips in the right upper quadrant. Degenerative changes in the thoracic spine.  IMPRESSION: Pulmonary hyperinflation. Scarring in the right lung base. No evidence of active pulmonary disease.   Electronically Signed   By: Lucienne Capers M.D.   On: 10/08/2014 22:15    Microbiology: No results found for this or any previous visit (from the past 240 hour(s)).   Labs: Basic Metabolic Panel:  Recent Labs Lab 10/09/14 0356 10/10/14 0625 10/11/14 0545 10/12/14 0605  NA 141 140 140 138  K 3.4* 4.4 3.6 4.7  CL 109 109 109 107  CO2 23 22 26 23   GLUCOSE 120* 137* 138* 149*  BUN 8 18 24* 21  CREATININE 0.99 1.13*  1.18* 1.05  CALCIUM 8.1* 8.5 8.0* 8.7   Liver Function Tests:  Recent Labs Lab 10/09/14 0356 10/10/14 0625 10/11/14 0545 10/12/14 0605  AST 24 20 20 17   ALT 17 15 17 17   ALKPHOS 68 58 54 55  BILITOT 0.5 0.6 0.7 0.4  PROT 6.2 5.6* 5.5* 5.6*  ALBUMIN 3.5 3.3* 3.0* 3.0*   No results for input(s): LIPASE, AMYLASE in the last 168 hours. No results for input(s): AMMONIA in the last 168 hours. CBC:  Recent Labs Lab 10/09/14 0356  WBC 9.2  NEUTROABS 6.6  HGB 13.9  HCT 40.6  MCV 89.0  PLT 151   Cardiac Enzymes: No results for input(s): CKTOTAL, CKMB, CKMBINDEX, TROPONINI in the last 168 hours. BNP: BNP (last 3 results) No results for input(s): PROBNP in the last 8760 hours. CBG:  Recent Labs Lab 10/11/14 1616 10/11/14 1938 10/12/14 0030 10/12/14 0407 10/12/14 0734  GLUCAP 128* 117* 155* 166* 119*       Signed:  Charlynne Cousins  [facility] 10/12/2014, 4:05 PM

## 2014-12-21 DIAGNOSIS — E119 Type 2 diabetes mellitus without complications: Secondary | ICD-10-CM | POA: Diagnosis not present

## 2014-12-21 DIAGNOSIS — E049 Nontoxic goiter, unspecified: Secondary | ICD-10-CM | POA: Diagnosis not present

## 2014-12-21 DIAGNOSIS — I10 Essential (primary) hypertension: Secondary | ICD-10-CM | POA: Diagnosis not present

## 2014-12-21 DIAGNOSIS — F419 Anxiety disorder, unspecified: Secondary | ICD-10-CM | POA: Diagnosis not present

## 2015-01-20 ENCOUNTER — Ambulatory Visit (INDEPENDENT_AMBULATORY_CARE_PROVIDER_SITE_OTHER): Payer: Medicare Other | Admitting: Endocrinology

## 2015-01-20 ENCOUNTER — Encounter: Payer: Self-pay | Admitting: Endocrinology

## 2015-01-20 VITALS — BP 138/86 | HR 82 | Temp 98.4°F | Resp 14 | Ht 63.0 in | Wt 164.6 lb

## 2015-01-20 DIAGNOSIS — E041 Nontoxic single thyroid nodule: Secondary | ICD-10-CM | POA: Insufficient documentation

## 2015-01-20 NOTE — Progress Notes (Signed)
Patient ID: Victoria Bird, female   DOB: Jul 24, 1946, 69 y.o.   MRN: [id number]    Reason for Appointment: Goiter, new consultation    History of Present Illness:   The patient's thyroid enlargement was first discovered in 06/2014 when she was having a CT scan of her neck. She was also told by her PCP recently on exam that she had an enlarged thyroid   Patient thinks that she has had choking when she is eating over the last year or so.  She thinks this is getting more regular and is occurring almost daily.  She is to start choking when she is eating and will cough.  However she can swallow her food without any difficulty Does not feel like she has a pressure or choking sensation in her neck otherwise except when she is lying down she may feel better lying on one side or the other.  Otherwise she may have some wheezing She thinks that sometimes she feels a swelling in her neck also but does not see it visibly  TSH level on 12/21/14 is 3.25  Lab Results  Component Value Date   TSH 2.133 01/07/2013    She has not had a thyroid ultrasound     Medication List       This list is accurate as of: 01/20/15  9:21 PM.  Always use your most recent med list.               albuterol 108 (90 BASE) MCG/ACT inhaler  Commonly known as:  PROVENTIL HFA;VENTOLIN HFA  Inhale 2 puffs into the lungs every 6 (six) hours as needed. For shortness of breath.     albuterol (5 MG/ML) 0.5% nebulizer solution  Commonly known as:  PROVENTIL  Take 0.5 mLs (2.5 mg total) by nebulization every 4 (four) hours as needed for wheezing or shortness of breath.     ALPRAZolam 0.5 MG tablet  Commonly known as:  XANAX  Take 0.5 mg by mouth 3 (three) times daily as needed for anxiety.     amLODipine 5 MG tablet  Commonly known as:  NORVASC  Take 1 tablet (5 mg total) by mouth daily.     benzonatate 200 MG capsule  Commonly known as:  TESSALON  Take 1 capsule (200 mg total) by mouth 3 (three) times  daily as needed for cough.     CORICIDIN 2-325 MG Tabs  Generic drug:  Chlorpheniramine-APAP  Take 1 tablet by mouth every 4 (four) hours.     guaiFENesin 600 MG 12 hr tablet  Commonly known as:  MUCINEX  Take 600 mg by mouth 2 (two) times daily.     HYDROcodone-acetaminophen 5-325 MG per tablet  Commonly known as:  NORCO/VICODIN  Take 1-2 tablets by mouth every 4 (four) hours as needed for moderate pain.     ipratropium 0.02 % nebulizer solution  Commonly known as:  ATROVENT  Take 2.5 mLs (0.5 mg total) by nebulization every 4 (four) hours as needed for wheezing.     losartan-hydrochlorothiazide 50-12.5 MG per tablet  Commonly known as:  HYZAAR  Take 1 tablet by mouth daily.     metFORMIN 500 MG tablet  Commonly known as:  GLUCOPHAGE  Take 500 mg by mouth 2 (two) times daily with a meal.     pantoprazole 40 MG tablet  Commonly known as:  PROTONIX  Take 1 tablet (40 mg total) by mouth 2 (two) times daily.     tiotropium  18 MCG inhalation capsule  Commonly known as:  SPIRIVA  Place 1 capsule (18 mcg total) into inhaler and inhale daily.        Allergies:  Allergies  Allergen Reactions  . Aspartame And Phenylalanine Nausea And Vomiting    Patient says she is allergic to all artificial sweeteners  . Tramadol Shortness Of Breath and Nausea Only  . Codeine Itching  . Penicillins Other (See Comments)    Rash and joint pain  . Sulfa Antibiotics Other (See Comments)    Kidney problem   . Prednisone Rash    Patient stated she received Prednisone while she was in the hospital and experienced a rash and "extreme pain.'    Past Medical History  Diagnosis Date  . Diabetes mellitus   . Hypertension   . COPD (chronic obstructive pulmonary disease)   . Bronchiectasis   . Chronic pain   . Anxiety   . Tracheobronchitis 01/01/2012    Past Surgical History  Procedure Laterality Date  . Appendectomy    . Cholecystectomy    . Abdominal hysterectomy    . Joint replacement     . Fracture surgery      Family History  Problem Relation Age of Onset  . Heart attack Mother   . Diabetes Maternal Grandfather   . Thyroid disease Neg Hx     Social History:  reports that she has been smoking Cigarettes.  She has a 19.5 pack-year smoking history. She has never used smokeless tobacco. She reports that she does not drink alcohol or use illicit drugs.   Review of Systems:  Review of Systems  Respiratory: Positive for shortness of breath.    There is  a history of high blood pressure.              She has a history of Diabetes which has been well controlled  And she is on  Metformin and Levemir insulin.   Lab Results  Component Value Date   HGBA1C 5.7* 10/09/2014   HGBA1C 6.2* 12/27/2011   HGBA1C 5.7* 12/14/2011   Lab Results  Component Value Date   CREATININE 1.05 10/12/2014               Examination:   BP 138/86 mmHg  Pulse 82  Temp(Src) 98.4 F (36.9 C)  Resp 14  Ht 5\' 3"  (1.6 m)  Wt 164 lb 9.6 oz (74.662 kg)  BMI 29.16 kg/m2  SpO2 93%   General Appearance: pleasant,   Well-built and nourished , not unusually anxious        Eyes: No abnormal prominence or eyelid swelling.          Neck: The thyroid is  Difficult to palpate but has a smooth rounded slightly firm left medial lobe palpable on swallowing , difficulty To judge the size of the enlargement. Right lobe was not palpable There is no stridor. Pemberton sign is negative There is no lymphadenopathy .   trachea is mildly deviated to the right Cardiovascular: Normal  heart sounds, no murmur Respiratory:  Lungs clear Neurological: REFLEXES: at biceps are normal.  No tremor present Skin: no rash        Assessment/Plan:   Left-sided goiter, reportedly diffuse on CT scan done in 06/2014.  This is somewhat difficult to palpate Clinically and may be at least partly intrathoracic. Not clear this is large enough to cause her choking symptoms on swallowing    This will need to be evaluated more  completely with  an ultrasound.  She is euthyroid with recent normal TSH and discussed that usually results of thyroid suppression on thyroid size is not successful.  Also discussed that she may have other issues causing her pharyngeal dysfunction on swallowing.    Further management will be discussed with her when the ultrasound report is available   [facility] 01/20/2015

## 2015-01-22 ENCOUNTER — Other Ambulatory Visit: Payer: Medicare Other

## 2015-01-25 ENCOUNTER — Ambulatory Visit
Admission: RE | Admit: 2015-01-25 | Discharge: 2015-01-25 | Disposition: A | Discharge status: Home / Self Care | Payer: Medicare Other | Source: Ambulatory Visit | Attending: Endocrinology | Admitting: Endocrinology

## 2015-01-25 DIAGNOSIS — E042 Nontoxic multinodular goiter: Secondary | ICD-10-CM | POA: Diagnosis not present

## 2015-01-25 DIAGNOSIS — E041 Nontoxic single thyroid nodule: Secondary | ICD-10-CM

## 2015-01-28 ENCOUNTER — Other Ambulatory Visit: Payer: Self-pay | Admitting: Endocrinology

## 2015-01-28 ENCOUNTER — Telehealth: Payer: Self-pay | Admitting: Endocrinology

## 2015-01-28 DIAGNOSIS — E041 Nontoxic single thyroid nodule: Secondary | ICD-10-CM

## 2015-01-28 NOTE — Telephone Encounter (Signed)
Patient would like to know the results of her ultra sound, please advise

## 2015-01-28 NOTE — Telephone Encounter (Signed)
Results given.

## 2015-02-10 ENCOUNTER — Other Ambulatory Visit: Payer: Medicare Other

## 2015-02-24 ENCOUNTER — Inpatient Hospital Stay: Admission: RE | Admit: 2015-02-24 | Payer: Medicare Other | Source: Ambulatory Visit

## 2015-03-11 ENCOUNTER — Ambulatory Visit
Admission: RE | Admit: 2015-03-11 | Discharge: 2015-03-11 | Disposition: A | Discharge status: Home / Self Care | Payer: Medicare Other | Source: Ambulatory Visit | Attending: Endocrinology | Admitting: Endocrinology

## 2015-03-11 ENCOUNTER — Other Ambulatory Visit (HOSPITAL_COMMUNITY)
Admission: RE | Admit: 2015-03-11 | Discharge: 2015-03-11 | Disposition: A | Discharge status: Home / Self Care | Payer: Medicare Other | Source: Ambulatory Visit | Attending: Interventional Radiology | Admitting: Interventional Radiology

## 2015-03-11 DIAGNOSIS — E041 Nontoxic single thyroid nodule: Secondary | ICD-10-CM

## 2015-03-11 DIAGNOSIS — E0789 Other specified disorders of thyroid: Secondary | ICD-10-CM | POA: Diagnosis not present

## 2015-03-12 NOTE — Progress Notes (Signed)
Quick Note:  Please let patient know that the pathology report is not normal but no definite cancer. Will need to have the nodule removed by surgeon to be sure of the diagnosis, can discuss with patient if needed  ______

## 2015-03-15 ENCOUNTER — Other Ambulatory Visit: Payer: Self-pay | Admitting: Endocrinology

## 2015-03-15 DIAGNOSIS — E041 Nontoxic single thyroid nodule: Secondary | ICD-10-CM

## 2015-03-31 ENCOUNTER — Ambulatory Visit: Payer: Self-pay | Admitting: Surgery

## 2015-03-31 DIAGNOSIS — D440 Neoplasm of uncertain behavior of thyroid gland: Secondary | ICD-10-CM | POA: Diagnosis not present

## 2015-03-31 DIAGNOSIS — E042 Nontoxic multinodular goiter: Secondary | ICD-10-CM | POA: Diagnosis not present

## 2015-04-29 DIAGNOSIS — R079 Chest pain, unspecified: Secondary | ICD-10-CM | POA: Diagnosis not present

## 2015-04-29 DIAGNOSIS — R739 Hyperglycemia, unspecified: Secondary | ICD-10-CM | POA: Diagnosis not present

## 2015-04-29 DIAGNOSIS — I10 Essential (primary) hypertension: Secondary | ICD-10-CM | POA: Diagnosis not present

## 2015-04-29 DIAGNOSIS — R0602 Shortness of breath: Secondary | ICD-10-CM | POA: Diagnosis not present

## 2015-05-27 ENCOUNTER — Other Ambulatory Visit (INDEPENDENT_AMBULATORY_CARE_PROVIDER_SITE_OTHER): Payer: Medicare Other

## 2015-05-27 ENCOUNTER — Ambulatory Visit (INDEPENDENT_AMBULATORY_CARE_PROVIDER_SITE_OTHER): Payer: Medicare Other | Admitting: Pulmonary Disease

## 2015-05-27 ENCOUNTER — Encounter: Payer: Self-pay | Admitting: Pulmonary Disease

## 2015-05-27 VITALS — BP 170/78 | HR 85 | Ht 63.0 in | Wt 166.4 lb

## 2015-05-27 DIAGNOSIS — R0600 Dyspnea, unspecified: Secondary | ICD-10-CM | POA: Diagnosis not present

## 2015-05-27 DIAGNOSIS — Z8701 Personal history of pneumonia (recurrent): Secondary | ICD-10-CM | POA: Insufficient documentation

## 2015-05-27 DIAGNOSIS — G4733 Obstructive sleep apnea (adult) (pediatric): Secondary | ICD-10-CM

## 2015-05-27 DIAGNOSIS — E785 Hyperlipidemia, unspecified: Secondary | ICD-10-CM | POA: Insufficient documentation

## 2015-05-27 DIAGNOSIS — G894 Chronic pain syndrome: Secondary | ICD-10-CM | POA: Insufficient documentation

## 2015-05-27 DIAGNOSIS — J449 Chronic obstructive pulmonary disease, unspecified: Secondary | ICD-10-CM

## 2015-05-27 DIAGNOSIS — J9622 Acute and chronic respiratory failure with hypercapnia: Secondary | ICD-10-CM

## 2015-05-27 DIAGNOSIS — F17200 Nicotine dependence, unspecified, uncomplicated: Secondary | ICD-10-CM

## 2015-05-27 DIAGNOSIS — Z720 Tobacco use: Secondary | ICD-10-CM

## 2015-05-27 DIAGNOSIS — J9621 Acute and chronic respiratory failure with hypoxia: Secondary | ICD-10-CM | POA: Insufficient documentation

## 2015-05-27 DIAGNOSIS — I10 Essential (primary) hypertension: Secondary | ICD-10-CM

## 2015-05-27 DIAGNOSIS — R0781 Pleurodynia: Secondary | ICD-10-CM | POA: Insufficient documentation

## 2015-05-27 DIAGNOSIS — K219 Gastro-esophageal reflux disease without esophagitis: Secondary | ICD-10-CM

## 2015-05-27 DIAGNOSIS — R1013 Epigastric pain: Secondary | ICD-10-CM

## 2015-05-27 DIAGNOSIS — J9611 Chronic respiratory failure with hypoxia: Secondary | ICD-10-CM

## 2015-05-27 DIAGNOSIS — R0789 Other chest pain: Secondary | ICD-10-CM

## 2015-05-27 LAB — CBC WITH DIFFERENTIAL/PLATELET
BASOS PCT: 0.4 % (ref 0.0–3.0)
Basophils Absolute: 0 10*3/uL (ref 0.0–0.1)
EOS ABS: 0.3 10*3/uL (ref 0.0–0.7)
EOS PCT: 2.9 % (ref 0.0–5.0)
HCT: 47.2 % — ABNORMAL HIGH (ref 36.0–46.0)
Hemoglobin: 16.2 g/dL — ABNORMAL HIGH (ref 12.0–15.0)
LYMPHS PCT: 33.3 % (ref 12.0–46.0)
Lymphs Abs: 3.4 10*3/uL (ref 0.7–4.0)
MCHC: 34.4 g/dL (ref 30.0–36.0)
MCV: 85.9 fl (ref 78.0–100.0)
MONO ABS: 0.7 10*3/uL (ref 0.1–1.0)
Monocytes Relative: 7 % (ref 3.0–12.0)
Neutro Abs: 5.8 10*3/uL (ref 1.4–7.7)
Neutrophils Relative %: 56.4 % (ref 43.0–77.0)
Platelets: 214 10*3/uL (ref 150.0–400.0)
RBC: 5.49 Mil/uL — AB (ref 3.87–5.11)
RDW: 14.5 % (ref 11.5–15.5)
WBC: 10.4 10*3/uL (ref 4.0–10.5)

## 2015-05-27 MED ORDER — ALBUTEROL SULFATE HFA 108 (90 BASE) MCG/ACT IN AERS: 1.0000 | INHALATION_SPRAY | RESPIRATORY_TRACT | Status: DC | PRN

## 2015-05-27 MED ORDER — TIOTROPIUM BROMIDE MONOHYDRATE 18 MCG IN CAPS: 18.0000 ug | ORAL_CAPSULE | Freq: Every day | RESPIRATORY_TRACT | Status: DC

## 2015-05-27 MED ORDER — RANITIDINE HCL 150 MG PO TABS: 150.0000 mg | ORAL_TABLET | Freq: Every day | ORAL | Status: DC

## 2015-05-27 NOTE — Patient Instructions (Signed)
1. Please take your Spiriva daily basis. We are sending and refills of your Spiriva and Ventolin. 2. I'm checking blood work today to make sure you are not anemic and that she do not have the genetic form of COPD. 3. We are going to do breathing tests and a walking test before your next appointment with me to see how severe your COPD is. 4. I'm starting you on Zantac to be taken before bedtime to help with your abdominal pain which may be caused by reflux. I will defer to your primary care physician on further evaluation of this. 5. You will return to clinic in 2 weeks.

## 2015-05-27 NOTE — Progress Notes (Signed)
Subjective:    Patient ID: Victoria Bird, female    DOB: Nov 16, 1945, 69 y.o.   MRN: [id number]  HPI Reports she was diagnosed with COPD in 2004 and initially had recurrent hospitalizations for bronchitis and COPD exacerbations.  She reports she has previously been prescribed oxygen but due to insurance changes she can no longer afford it.  She reports acute bronchitis/COPD exacerbations only 1-2 times a year at most. The last time she was in the hospital was in January 20`6 and about 1.5 years prior to that. She reports an intermittent cough productive of a clear phlegm. Denies any hemoptysis. She does have occasional wheezing. She reports she is only taking Spiriva intermittently. She is using her Ventolin rescue inhaler 1-2 times a day. She reports this has increased in the last couple of months. She reports she has more fatigue & dyspnea with exertion. She reports she has dyspnea simply walking from her bedroom to kitchen before she has dyspnea (39ft). She reports she has trouble washing dishes due to dyspnea. She reports she has had near syncope with exertion & dyspnea at times. She reports pain in her anterior chest radiating to her left shoulder, neck, and between her shoulder blades. She also has associated headaches & "tingling" in her arm. She describes the pain as a "hard, sharp" pain. She reports the pain is exacerbated with deep inspiration. She reports this occurs at rest as well as with exertion. Reports the pain can also occur after eating. She reports she has awoke at night with pain. She reports her chest pain is at its peak 8/10 and lasts for a couple of hours. She denies any reflux. She denies any dysphagia or odynophagia. She reports she sometimes "gets choked" on food or water but only happens intermittently. She does have some abdominal pain & nausea but no diarrhea or emesis recently. She admits she doesn't eat much. She does have sweats but denies any fever or chills.   Review  of Systems No rashes or bruising. No dysuria or hematuria. A pertinent 14 point review of systems is negative except as per the history of presenting illness.  Allergies  Allergen Reactions  . Aspartame And Phenylalanine Nausea And Vomiting    Patient says she is allergic to all artificial sweeteners  . Tramadol Shortness Of Breath and Nausea Only  . Codeine Itching  . Penicillins Other (See Comments)    Rash and joint pain  . Sulfa Antibiotics Other (See Comments)    Kidney problem   . Prednisone Rash    Patient stated she received Prednisone while she was in the hospital and experienced a rash and "extreme pain.'   Current Outpatient Prescriptions on File Prior to Visit  Medication Sig Dispense Refill  . albuterol (PROVENTIL HFA;VENTOLIN HFA) 108 (90 BASE) MCG/ACT inhaler Inhale 2 puffs into the lungs every 6 (six) hours as needed. For shortness of breath. 1 Inhaler 1  . albuterol (PROVENTIL) (5 MG/ML) 0.5% nebulizer solution Take 0.5 mLs (2.5 mg total) by nebulization every 4 (four) hours as needed for wheezing or shortness of breath. 20 mL 12  . ipratropium (ATROVENT) 0.02 % nebulizer solution Take 2.5 mLs (0.5 mg total) by nebulization every 4 (four) hours as needed for wheezing. 75 mL 12  . metFORMIN (GLUCOPHAGE) 500 MG tablet Take 500 mg by mouth 2 (two) times daily with a meal.     . tiotropium (SPIRIVA) 18 MCG inhalation capsule Place 1 capsule (18 mcg total) into inhaler and  inhale daily. 30 capsule 0   No current facility-administered medications on file prior to visit.   Past Medical History  Diagnosis Date  . Diabetes mellitus   . Hypertension   . COPD (chronic obstructive pulmonary disease)   . Chronic pain   . Anxiety   . Tracheobronchitis 01/01/2012  . High cholesterol   . OSA (obstructive sleep apnea)   . H/O: pneumonia    Past Surgical History  Procedure Laterality Date  . Appendectomy    . Cholecystectomy    . Abdominal hysterectomy    . Fracture surgery      . Rotator cuff repair    . Carpal tunnel release Left    Family History  Problem Relation Age of Onset  . Heart attack Mother   . Diabetes Maternal Grandfather   . Thyroid disease Neg Hx   . Lung disease Neg Hx    Social History   Social History  . Marital Status: Divorced    Spouse Name: N/A  . Number of Children: Y  . Years of Education: N/A   Occupational History  . unemployed    Social History Main Topics  . Smoking status: Current Every Day Smoker -- 0.50 packs/day for 41 years    Types: Cigarettes    Start date: 03/19/1974  . Smokeless tobacco: Never Used     Comment: Peak rate of 1.5ppd  . Alcohol Use: No  . Drug Use: No  . Sexual Activity: No   Other Topics Concern  . None   Social History Narrative   Daughter Edmonia Lynch) [phone]   Originally from [place]. Previously lived in [place]. She has traveled from [facility] to [place] & has also traveled across country to [place]. Multiple visits to [place], [place], [place], [place], [place]. She has a dog at home. No bird, mold, or hot tub exposure. She reports some years ago she did live in a home that had black mold in her closet. 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             Objective:   Physical Exam Blood pressure 170/78, pulse 85, height 5\' 3"  (1.6 m), weight 166 lb 6.4 oz (75.479 kg), SpO2 92 %. General:  Awake. Alert. No acute distress. Mild central obesity. Integument:  Warm & dry. No rash on exposed skin. No bruising. Lymphatics:  No appreciated cervical or supraclavicular lymphadenoapthy. HEENT:  Moist mucus membranes. No oral ulcers. No scleral injection or icterus. Poor dentition. PERRL. Cardiovascular:  Regular rate. No edema. No appreciable JVD.  Pulmonary:  Good aeration & clear to auscultation bilaterally. Symmetric chest wall expansion. No accessory muscle use. Abdomen: Soft. Normal  bowel sounds. Mildly protuberant. Moderately tender to deep palpation in epigastrium. Musculoskeletal:  Normal bulk and tone. Hand grip strength 5/5 bilaterally. No joint deformity or effusion appreciated. Neurological:  CN 2-12 grossly in tact. No meningismus. Moving all 4 extremities equally. Symmetric patellar deep tendon reflexes. Psychiatric:  Mood and affect congruent. Speech normal rhythm, rate & tone.   IMAGING CXR PA/LAT 10/08/14 (personally reviewed by me): Mild persistent linear opacity consistent with scar at the right lung base. Kyphosis noted. Hyperinflation with some flattening of the diaphragm. No pleural effusion or thickening appreciated. No parenchymal opacity or nodule appreciated. Heart normal in size. Mediastinum: Contour.  CT CHEST W/ 04/16/02 (per radiologist): No evidence for pulmonary emboli. No  pathologically enlarged mediastinal or hilar lymph nodes. Enlarged lower pole of left thyroid lobe with a nodular configuration. Mild water density alveolitis with an anterior portion of left upper lobe & medial anterior portion of right upper lobe. No parenchymal nodules.   CARDIAC TTE (01/08/13): LV normal in size. EF 60-65%. Normal wall motion without regional abnormality. LA & RA normal in size. RV normal in size and function. Pulmonary artery systolic pressure 43 mmHg. Trivial aortic regurgitation. No mitral stenosis or regurgitation. No pulmonic or tricuspid regurgitation. No pericardial effusion.  LABS 04/29/15 BMP: 140/4.2/102/24/16/1.01/93/9 LFT: 4/6.7/0.7/67/14/13  10/09/14 VBG (on room air): 7.39/37/59  04/22/13 CBC: 9.9/15.3/45.3/206  03/24/13 ANA: Negative  RF:  <7.0    Assessment & Plan:  69 year old female with ongoing tobacco use and remote diagnosis of COPD as well as chronic hypoxic respiratory failure and sleep apnea. The severity of the patient's underlying COPD is unclear to me at this time without pulmonary function testing. Additionally she seems to be more  symptomatic in the recent months with regards to her COPD using her rescue inhaler more frequently. She has been unable to afford oxygen therapy due to insurance constraints. The patient is currently undergoing preoperative evaluation prior to thyroidectomy for suspicion of an underlying malignancy. I personally reviewed her chest x-ray and prior serum testing which shows no parenchymal abnormality that would be concerning other than hyperinflation that is likely from underlying emphysema. Although, her prior CT scan of the chest per radiology's report shows no evidence of emphysema. I do question whether or not the chest pain she is reporting could be due to esophageal spasm versus reflux versus some sort of intra-abdominal process given her epigastric abdominal pain on exam today. I instructed the patient to contact my office if she had any further questions or concerns as we proceed with working up her multiple medical problems and evaluating her for her perioperative pulmonary risk.  1. Dyspnea: Likely secondary to patient's underlying COPD. Checking serum CBC to rule out anemia. 2. COPD: Unclear severity. Checking full pulmonary function testing with bronchodilator challenge before next appointment. Screening for alpha-1 antitrypsin deficiency. Counseled the patient to continue using Spiriva on a daily basis and use her rescue inhaler as needed. 3. Tobacco use disorder: I spent over 3 minutes counseling the patient on the need to completely quit smoking. However she is honest and reports that she is unable to quit at this time due to multiple psychosocial stressors. 4. Essential hypertension: Patient's blood pressure remains uncontrolled at this time. I rechecked her blood pressure myself in office. Recommended she address this further with her primary care physician. 5. Chronic hypoxic respiratory failure: Checking serum CBC & 6 minute walk test before next appointment. 6. GERD/abdominal pain: Prior  LFTs normal. Starting Zantac 150 milligrams by mouth daily at bedtime. Defer further workup to PCP. 7. OSA: Patient reportedly had polysomnogram in 2010. Plan to repeat polysomnogram after the patient's surgery to determine the severity/presence of underlying sleep apnea. 8. Atypical chest pain: Patient already has appointment with cardiology for perioperative evaluation. I will defer to them on further workup. 9. Follow-up: Patient to return to clinic in 2 weeks.

## 2015-05-31 LAB — ALPHA-1-ANTITRYPSIN: A1 ANTITRYPSIN SER: 164 mg/dL (ref 83–199)

## 2015-06-09 ENCOUNTER — Ambulatory Visit (HOSPITAL_COMMUNITY)
Admission: RE | Admit: 2015-06-09 | Discharge: 2015-06-09 | Disposition: A | Discharge status: Home / Self Care | Payer: Medicare Other | Source: Ambulatory Visit | Attending: Pulmonary Disease | Admitting: Pulmonary Disease

## 2015-06-09 DIAGNOSIS — J449 Chronic obstructive pulmonary disease, unspecified: Secondary | ICD-10-CM | POA: Insufficient documentation

## 2015-06-09 DIAGNOSIS — R0600 Dyspnea, unspecified: Secondary | ICD-10-CM | POA: Diagnosis present

## 2015-06-09 LAB — PULMONARY FUNCTION TEST
DL/VA % PRED: 50 %
DL/VA: 2.4 ml/min/mmHg/L
DLCO COR: 8.7 ml/min/mmHg
DLCO UNC % PRED: 39 %
DLCO UNC: 9.37 ml/min/mmHg
DLCO cor % pred: 36 %
FEF 25-75 Post: 0.39 L/sec
FEF 25-75 Pre: 0.43 L/sec
FEF2575-%CHANGE-POST: -9 %
FEF2575-%PRED-POST: 20 %
FEF2575-%PRED-PRE: 22 %
FEV1-%Change-Post: -4 %
FEV1-%Pred-Post: 45 %
FEV1-%Pred-Pre: 47 %
FEV1-Post: 1.03 L
FEV1-Pre: 1.08 L
FEV1FVC-%Change-Post: -4 %
FEV1FVC-%Pred-Pre: 64 %
FEV6-%CHANGE-POST: -2 %
FEV6-%PRED-POST: 71 %
FEV6-%Pred-Pre: 72 %
FEV6-Post: 2.04 L
FEV6-Pre: 2.09 L
FEV6FVC-%CHANGE-POST: -1 %
FEV6FVC-%PRED-PRE: 98 %
FEV6FVC-%Pred-Post: 96 %
FVC-%Change-Post: 0 %
FVC-%Pred-Post: 73 %
FVC-%Pred-Pre: 73 %
FVC-Post: 2.2 L
FVC-Pre: 2.21 L
POST FEV1/FVC RATIO: 47 %
POST FEV6/FVC RATIO: 93 %
PRE FEV1/FVC RATIO: 49 %
Pre FEV6/FVC Ratio: 94 %
RV % pred: 135 %
RV: 2.88 L
TLC % pred: 105 %
TLC: 5.26 L

## 2015-06-09 MED ORDER — ALBUTEROL SULFATE (2.5 MG/3ML) 0.083% IN NEBU
2.5000 mg | INHALATION_SOLUTION | Freq: Once | RESPIRATORY_TRACT | Status: AC
  Administered 2015-06-09: 2.5 mg via RESPIRATORY_TRACT

## 2015-06-11 ENCOUNTER — Telehealth: Payer: Self-pay | Admitting: *Deleted

## 2015-06-11 NOTE — Telephone Encounter (Signed)
requested ekg from Victoria Bird, she will fax it over.

## 2015-06-15 ENCOUNTER — Encounter: Payer: Self-pay | Admitting: Cardiovascular Disease

## 2015-06-15 ENCOUNTER — Ambulatory Visit (INDEPENDENT_AMBULATORY_CARE_PROVIDER_SITE_OTHER): Payer: Medicare Other | Admitting: Cardiovascular Disease

## 2015-06-15 VITALS — BP 130/60 | HR 88 | Ht 63.0 in | Wt 166.0 lb

## 2015-06-15 DIAGNOSIS — R079 Chest pain, unspecified: Secondary | ICD-10-CM | POA: Diagnosis not present

## 2015-06-15 DIAGNOSIS — J449 Chronic obstructive pulmonary disease, unspecified: Secondary | ICD-10-CM

## 2015-06-15 NOTE — Progress Notes (Signed)
Cardiology Office Note   Date:  06/15/2015   ID:  Victoria Bird, DOB 1946-08-09, MRN [id number]  PCP:  Linden Dolin, PA-C  Cardiologist:   Acie Fredrickson Wonda Cheng, MD   Chief Complaint  Patient presents with  . Chest Pain   Problem list 1. Chest pain 2. COPD 3. Hypertension 4. Diabetes mellitus 5. Possible thyroid cancer    History of Present Illness: Victoria Bird is a 69 y.o. female who presents for evaluation of chest pain. She was recently seen by her doctor for an abnormal thyroid biopsy. She barely any staff thyroidectomy but needs to be cleared from a cardiac standpoint.  Has occasional CP,  Occasionally has back pain and radiates through to the chest . Occasionally has radiation to the neck. Also has chest tightness.   Does not exercise.   She thinks the pains might worsen with exertion but she has pains frequently while at rest.  They last for an hour or so  - occasionally longer .   Associated with sweats and worsening dyspnea .  Still smokes - 1/2 ppd   Past Medical History  Diagnosis Date  . Diabetes mellitus   . Hypertension   . COPD (chronic obstructive pulmonary disease)   . Chronic pain   . Anxiety   . Tracheobronchitis 01/01/2012  . High cholesterol   . OSA (obstructive sleep apnea)   . H/O: pneumonia     Past Surgical History  Procedure Laterality Date  . Appendectomy    . Cholecystectomy    . Abdominal hysterectomy    . Fracture surgery    . Rotator cuff repair    . Carpal tunnel release Left      Current Outpatient Prescriptions  Medication Sig Dispense Refill  . albuterol (PROVENTIL HFA;VENTOLIN HFA) 108 (90 BASE) MCG/ACT inhaler Inhale 1-2 puffs into the lungs every 4 (four) hours as needed. For shortness of breath. 1 Inhaler 3  . albuterol (PROVENTIL) (5 MG/ML) 0.5% nebulizer solution Take 0.5 mLs (2.5 mg total) by nebulization every 4 (four) hours as needed for wheezing or shortness of breath. 20 mL 12  . ipratropium  (ATROVENT) 0.02 % nebulizer solution Take 2.5 mLs (0.5 mg total) by nebulization every 4 (four) hours as needed for wheezing. 75 mL 12  . losartan-hydrochlorothiazide (HYZAAR) 100-12.5 MG per tablet Take 1 tablet by mouth daily.    . metFORMIN (GLUCOPHAGE) 500 MG tablet Take 500 mg by mouth 2 (two) times daily with a meal.     . ranitidine (ZANTAC) 150 MG tablet Take 1 tablet (150 mg total) by mouth at bedtime. 30 tablet 3  . tiotropium (SPIRIVA) 18 MCG inhalation capsule Place 1 capsule (18 mcg total) into inhaler and inhale daily. 30 capsule 3   No current facility-administered medications for this visit.    Allergies:   Aspartame and phenylalanine; Tramadol; Codeine; Penicillins; Sulfa antibiotics; and Prednisone    Social History:  The patient  reports that she has been smoking Cigarettes.  She started smoking about 41 years ago. She has a 20.5 pack-year smoking history. She has never used smokeless tobacco. She reports that she does not drink alcohol or use illicit drugs.   Family History:  The patient's family history includes Diabetes in her maternal grandfather; Heart attack in her maternal grandfather, maternal grandmother, and mother; Hypertension in her maternal grandfather and maternal grandmother; Stroke in her maternal grandfather. There is no history of Thyroid disease or Lung disease.    ROS:  Please see the history of present illness.    Review of Systems: Constitutional:  denies fever, chills, diaphoresis, appetite change and fatigue.  HEENT: denies photophobia, eye pain, redness, hearing loss, ear pain, congestion, sore throat, rhinorrhea, sneezing, neck pain, neck stiffness and tinnitus.  Respiratory: admits to SOB, DOE, cough, chest tightness, and wheezing.  Cardiovascular: admits to chest pain, denies palpitations .  Denies leg swelling.  Gastrointestinal: denies nausea, vomiting, abdominal pain, diarrhea, constipation, blood in stool.  Genitourinary: denies dysuria,  urgency, frequency, hematuria, flank pain and difficulty urinating.  Musculoskeletal: denies  myalgias, back pain, joint swelling, arthralgias and gait problem.   Skin: denies pallor, rash and wound.  Neurological: denies dizziness, seizures, syncope, weakness, light-headedness, numbness and headaches.   Hematological: denies adenopathy, easy bruising, personal or family bleeding history.  Psychiatric/ Behavioral: denies suicidal ideation, mood changes, confusion, nervousness, sleep disturbance and agitation.       All other systems are reviewed and negative.    PHYSICAL EXAM: VS:  BP 130/60 mmHg  Pulse 88  Ht 5\' 3"  (1.6 m)  Wt 75.297 kg (166 lb)  BMI 29.41 kg/m2 , BMI Body mass index is 29.41 kg/(m^2). GEN: Well nourished, well developed, in no acute distress HEENT: normal Neck: no JVD, carotid bruits, or masses Cardiac: RRR; no murmurs, rubs, or gallops,no edema  Respiratory:  clear to auscultation bilaterally, normal work of breathing GI: soft, nontender, nondistended, + BS MS: no deformity or atrophy Skin: warm and dry, no rash Neuro:  Strength and sensation are intact Psych: normal   EKG:  EKG is not ordered today. The ekg ordered today demonstrates    Recent Labs: 10/12/2014: ALT 17; BUN 21; Creatinine, Ser 1.05; Potassium 4.7; Sodium 138 05/27/2015: Hemoglobin 16.2*; Platelets 214.0    Lipid Panel No results found for: CHOL, TRIG, HDL, CHOLHDL, VLDL, LDLCALC, LDLDIRECT    Wt Readings from Last 3 Encounters:  06/15/15 75.297 kg (166 lb)  05/27/15 75.479 kg (166 lb 6.4 oz)  01/20/15 74.662 kg (164 lb 9.6 oz)      Other studies Reviewed: Additional studies/ records that were reviewed today include: . Review of the above records demonstrates:    ASSESSMENT AND PLAN:  1.  Chest discomfort: The patient complains of several types of chest pain. These occur both at rest and exertion. She does not get any exercise. She has a long history of cigarette smoking and  also has a history of diabetes. She certainly has risk factors for coronary artery disease. I like to proceed with a [place] study for further evaluation. If the Myoview study is normal then I would conclude that she is at low risk for upcoming thyroidectomy. If the Myoview study is abnormal then we will need to see her back and discuss cardiac catheterization.  I'll see her on an as-needed basis assuming that the Myoview study is fairly normal.   Current medicines are reviewed at length with the patient today.  The patient does not have concerns regarding medicines.  The following changes have been made:  no change  Labs/ tests ordered today include:  No orders of the defined types were placed in this encounter.     Disposition:   FU with me as needed.      Glendoris Nodarse, Wonda Cheng, MD  06/15/2015 9:09 AM    [facility] [place], [place], [place]  [address] Phone: [phone]; Fax: [phone]   [place]  [address]  [place] [place], [place]  [address] [phone]   Fax [phone]

## 2015-06-15 NOTE — Patient Instructions (Addendum)
Medication Instructions:  Your physician recommends that you continue on your current medications as directed. Please refer to the Current Medication list given to you today.   Labwork: None Ordered  Testing/Procedures: Your physician has requested that you have a lexiscan myoview. For further information please visit [email or web address]. Please follow instruction sheet, as given.    Follow-Up: Your physician recommends that you schedule a follow-up appointment in: as needed with Dr. Nahser     

## 2015-06-16 ENCOUNTER — Emergency Department (HOSPITAL_COMMUNITY): Payer: Medicare Other

## 2015-06-16 ENCOUNTER — Ambulatory Visit (HOSPITAL_COMMUNITY): Payer: Medicare Other

## 2015-06-16 ENCOUNTER — Telehealth (HOSPITAL_COMMUNITY): Payer: Self-pay

## 2015-06-16 ENCOUNTER — Encounter (HOSPITAL_COMMUNITY): Payer: Self-pay

## 2015-06-16 ENCOUNTER — Observation Stay (HOSPITAL_COMMUNITY): Payer: Medicare Other

## 2015-06-16 ENCOUNTER — Observation Stay (HOSPITAL_COMMUNITY)
Admission: AD | Admit: 2015-06-16 | Discharge: 2015-06-18 | Disposition: A | Discharge status: Home / Self Care | Payer: Medicare Other | Attending: Internal Medicine | Admitting: Internal Medicine

## 2015-06-16 DIAGNOSIS — R51 Headache: Secondary | ICD-10-CM | POA: Diagnosis not present

## 2015-06-16 DIAGNOSIS — Z79899 Other long term (current) drug therapy: Secondary | ICD-10-CM | POA: Insufficient documentation

## 2015-06-16 DIAGNOSIS — E780 Pure hypercholesterolemia: Secondary | ICD-10-CM | POA: Insufficient documentation

## 2015-06-16 DIAGNOSIS — G950 Syringomyelia and syringobulbia: Secondary | ICD-10-CM | POA: Insufficient documentation

## 2015-06-16 DIAGNOSIS — I2510 Atherosclerotic heart disease of native coronary artery without angina pectoris: Secondary | ICD-10-CM | POA: Diagnosis not present

## 2015-06-16 DIAGNOSIS — R079 Chest pain, unspecified: Secondary | ICD-10-CM | POA: Insufficient documentation

## 2015-06-16 DIAGNOSIS — R1110 Vomiting, unspecified: Secondary | ICD-10-CM

## 2015-06-16 DIAGNOSIS — K219 Gastro-esophageal reflux disease without esophagitis: Secondary | ICD-10-CM | POA: Diagnosis not present

## 2015-06-16 DIAGNOSIS — Z791 Long term (current) use of non-steroidal anti-inflammatories (NSAID): Secondary | ICD-10-CM | POA: Insufficient documentation

## 2015-06-16 DIAGNOSIS — J9611 Chronic respiratory failure with hypoxia: Secondary | ICD-10-CM | POA: Diagnosis not present

## 2015-06-16 DIAGNOSIS — I10 Essential (primary) hypertension: Secondary | ICD-10-CM | POA: Insufficient documentation

## 2015-06-16 DIAGNOSIS — G4733 Obstructive sleep apnea (adult) (pediatric): Secondary | ICD-10-CM | POA: Insufficient documentation

## 2015-06-16 DIAGNOSIS — F17210 Nicotine dependence, cigarettes, uncomplicated: Secondary | ICD-10-CM | POA: Diagnosis not present

## 2015-06-16 DIAGNOSIS — E041 Nontoxic single thyroid nodule: Secondary | ICD-10-CM | POA: Insufficient documentation

## 2015-06-16 DIAGNOSIS — K5900 Constipation, unspecified: Secondary | ICD-10-CM | POA: Diagnosis not present

## 2015-06-16 DIAGNOSIS — R0781 Pleurodynia: Secondary | ICD-10-CM | POA: Diagnosis present

## 2015-06-16 DIAGNOSIS — R42 Dizziness and giddiness: Secondary | ICD-10-CM | POA: Diagnosis not present

## 2015-06-16 DIAGNOSIS — M6281 Muscle weakness (generalized): Secondary | ICD-10-CM | POA: Diagnosis not present

## 2015-06-16 DIAGNOSIS — J449 Chronic obstructive pulmonary disease, unspecified: Secondary | ICD-10-CM | POA: Diagnosis not present

## 2015-06-16 DIAGNOSIS — R112 Nausea with vomiting, unspecified: Secondary | ICD-10-CM | POA: Diagnosis not present

## 2015-06-16 DIAGNOSIS — E119 Type 2 diabetes mellitus without complications: Secondary | ICD-10-CM | POA: Diagnosis not present

## 2015-06-16 LAB — RAPID URINE DRUG SCREEN, HOSP PERFORMED
Amphetamines: NOT DETECTED
BARBITURATES: NOT DETECTED
Benzodiazepines: NOT DETECTED
Cocaine: NOT DETECTED
Opiates: NOT DETECTED
Tetrahydrocannabinol: NOT DETECTED

## 2015-06-16 LAB — CBC WITH DIFFERENTIAL/PLATELET
Basophils Absolute: 0 10*3/uL (ref 0.0–0.1)
Basophils Relative: 0 %
EOS ABS: 0.2 10*3/uL (ref 0.0–0.7)
EOS PCT: 2 %
HCT: 47.8 % — ABNORMAL HIGH (ref 36.0–46.0)
Hemoglobin: 16.4 g/dL — ABNORMAL HIGH (ref 12.0–15.0)
LYMPHS ABS: 2.9 10*3/uL (ref 0.7–4.0)
LYMPHS PCT: 30 %
MCH: 30.1 pg (ref 26.0–34.0)
MCHC: 34.3 g/dL (ref 30.0–36.0)
MCV: 87.7 fL (ref 78.0–100.0)
MONOS PCT: 6 %
Monocytes Absolute: 0.6 10*3/uL (ref 0.1–1.0)
Neutro Abs: 6.1 10*3/uL (ref 1.7–7.7)
Neutrophils Relative %: 62 %
PLATELETS: 192 10*3/uL (ref 150–400)
RBC: 5.45 MIL/uL — ABNORMAL HIGH (ref 3.87–5.11)
RDW: 13.5 % (ref 11.5–15.5)
WBC: 9.9 10*3/uL (ref 4.0–10.5)

## 2015-06-16 LAB — URINALYSIS, ROUTINE W REFLEX MICROSCOPIC
Bilirubin Urine: NEGATIVE
Glucose, UA: NEGATIVE mg/dL
Hgb urine dipstick: NEGATIVE
Ketones, ur: NEGATIVE mg/dL
LEUKOCYTES UA: NEGATIVE
NITRITE: NEGATIVE
PH: 5.5 (ref 5.0–8.0)
Protein, ur: NEGATIVE mg/dL
SPECIFIC GRAVITY, URINE: 1.013 (ref 1.005–1.030)
Urobilinogen, UA: 1 mg/dL (ref 0.0–1.0)

## 2015-06-16 LAB — COMPREHENSIVE METABOLIC PANEL
ALK PHOS: 65 U/L (ref 38–126)
ALT: 14 U/L (ref 14–54)
ANION GAP: 5 (ref 5–15)
AST: 17 U/L (ref 15–41)
Albumin: 3.9 g/dL (ref 3.5–5.0)
BUN: 13 mg/dL (ref 6–20)
CHLORIDE: 103 mmol/L (ref 101–111)
CO2: 30 mmol/L (ref 22–32)
Calcium: 8.8 mg/dL — ABNORMAL LOW (ref 8.9–10.3)
Creatinine, Ser: 0.98 mg/dL (ref 0.44–1.00)
GFR, EST NON AFRICAN AMERICAN: 58 mL/min — AB (ref >60)
GLUCOSE: 98 mg/dL (ref 65–99)
Potassium: 3.8 mmol/L (ref 3.5–5.1)
SODIUM: 138 mmol/L (ref 135–145)
TOTAL PROTEIN: 7.3 g/dL (ref 6.5–8.1)
Total Bilirubin: 0.7 mg/dL (ref 0.3–1.2)

## 2015-06-16 LAB — TROPONIN I

## 2015-06-16 LAB — LIPASE, BLOOD: Lipase: 26 U/L (ref 22–51)

## 2015-06-16 LAB — I-STAT CG4 LACTIC ACID, ED: Lactic Acid, Venous: 0.7 mmol/L (ref 0.5–2.0)

## 2015-06-16 MED ORDER — ALBUTEROL SULFATE (2.5 MG/3ML) 0.083% IN NEBU: 3.0000 mL | INHALATION_SOLUTION | RESPIRATORY_TRACT | Status: DC | PRN

## 2015-06-16 MED ORDER — TIOTROPIUM BROMIDE MONOHYDRATE 18 MCG IN CAPS
18.0000 ug | ORAL_CAPSULE | Freq: Every day | RESPIRATORY_TRACT | Status: DC
  Administered 2015-06-17 – 2015-06-18 (×2): 18 ug via RESPIRATORY_TRACT
  Filled 2015-06-16: qty 5

## 2015-06-16 MED ORDER — SENNOSIDES-DOCUSATE SODIUM 8.6-50 MG PO TABS: 1.0000 | ORAL_TABLET | Freq: Every evening | ORAL | Status: DC | PRN

## 2015-06-16 MED ORDER — ONDANSETRON HCL 4 MG/2ML IJ SOLN
4.0000 mg | Freq: Once | INTRAMUSCULAR | Status: AC
  Administered 2015-06-16: 4 mg via INTRAVENOUS
  Filled 2015-06-16: qty 2

## 2015-06-16 MED ORDER — SODIUM CHLORIDE 0.9 % IV SOLN
Freq: Once | INTRAVENOUS | Status: AC
  Administered 2015-06-16: 19:00:00 via INTRAVENOUS

## 2015-06-16 MED ORDER — ALBUTEROL SULFATE (2.5 MG/3ML) 0.083% IN NEBU: 2.5000 mg | INHALATION_SOLUTION | RESPIRATORY_TRACT | Status: DC | PRN

## 2015-06-16 MED ORDER — SODIUM CHLORIDE 0.9 % IV BOLUS (SEPSIS)
1000.0000 mL | Freq: Once | INTRAVENOUS | Status: AC
  Administered 2015-06-16: 1000 mL via INTRAVENOUS

## 2015-06-16 MED ORDER — LOSARTAN POTASSIUM 50 MG PO TABS
100.0000 mg | ORAL_TABLET | Freq: Every day | ORAL | Status: DC
  Administered 2015-06-16 – 2015-06-18 (×3): 100 mg via ORAL
  Filled 2015-06-16 (×3): qty 2

## 2015-06-16 MED ORDER — SODIUM CHLORIDE 0.9 % IV SOLN
INTRAVENOUS | Status: DC
  Administered 2015-06-17: 06:00:00 via INTRAVENOUS

## 2015-06-16 MED ORDER — MECLIZINE HCL 25 MG PO TABS
25.0000 mg | ORAL_TABLET | Freq: Once | ORAL | Status: AC
  Administered 2015-06-16: 25 mg via ORAL
  Filled 2015-06-16: qty 1

## 2015-06-16 MED ORDER — ENOXAPARIN SODIUM 40 MG/0.4ML ~~LOC~~ SOLN
40.0000 mg | SUBCUTANEOUS | Status: DC
  Administered 2015-06-16 – 2015-06-17 (×2): 40 mg via SUBCUTANEOUS
  Filled 2015-06-16 (×2): qty 0.4

## 2015-06-16 MED ORDER — ASPIRIN 300 MG RE SUPP
300.0000 mg | Freq: Every day | RECTAL | Status: DC
  Filled 2015-06-16 (×3): qty 1

## 2015-06-16 MED ORDER — ASPIRIN 325 MG PO TABS
325.0000 mg | ORAL_TABLET | Freq: Every day | ORAL | Status: DC
  Administered 2015-06-16 – 2015-06-18 (×3): 325 mg via ORAL
  Filled 2015-06-16 (×3): qty 1

## 2015-06-16 MED ORDER — HYDRALAZINE HCL 20 MG/ML IJ SOLN
10.0000 mg | INTRAMUSCULAR | Status: DC | PRN
  Administered 2015-06-16: 10 mg via INTRAVENOUS
  Filled 2015-06-16: qty 1

## 2015-06-16 MED ORDER — METFORMIN HCL 500 MG PO TABS
500.0000 mg | ORAL_TABLET | Freq: Two times a day (BID) | ORAL | Status: DC
  Administered 2015-06-17: 500 mg via ORAL
  Filled 2015-06-16: qty 1

## 2015-06-16 NOTE — ED Notes (Signed)
Patient transported to MRI 

## 2015-06-16 NOTE — ED Provider Notes (Signed)
CSN: [id number]     Arrival date & time 06/16/15  1235 History   First MD Initiated Contact with Patient 06/16/15 1501     Chief Complaint  Patient presents with  . Emesis     (Consider location/radiation/quality/duration/timing/severity/associated sxs/prior Treatment) HPI Comments: 69 y.o. Female with history of DM, HTN, COPD presents with her daughter for vomiting.  The patient states that she has been sick for the last 3 months and that no one has been able to figure out what is going on.  The patient states that for months she has had intermittent vertigo where she gets very dizzy and her eyes cannot focus.  Since last night she has developed vomiting and worsened vertigo.  She says that she currently does not feel dizzy but that it usually comes on when she goes to stand up.  She says that because she has been vomiting so much she has felt to weak to get up and walk around her house.  She denies diarrhea.  No known fever or chills.  Denies chest pain or shortness of breath.   Past Medical History  Diagnosis Date  . Diabetes mellitus   . Hypertension   . COPD (chronic obstructive pulmonary disease)   . Chronic pain   . Anxiety   . Tracheobronchitis 01/01/2012  . High cholesterol   . OSA (obstructive sleep apnea)   . H/O: pneumonia    Past Surgical History  Procedure Laterality Date  . Appendectomy    . Cholecystectomy    . Abdominal hysterectomy    . Fracture surgery    . Rotator cuff repair    . Carpal tunnel release Left    Family History  Problem Relation Age of Onset  . Heart attack Mother   . Diabetes Maternal Grandfather   . Thyroid disease Neg Hx   . Lung disease Neg Hx   . Heart attack Maternal Grandfather   . Heart attack Maternal Grandmother   . Hypertension Maternal Grandmother   . Hypertension Maternal Grandfather   . Stroke Maternal Grandfather    Social History  Substance Use Topics  . Smoking status: Current Every Day Smoker -- 0.50 packs/day for 41  years    Types: Cigarettes    Start date: 03/19/1974  . Smokeless tobacco: Never Used     Comment: Peak rate of 1.5ppd  . Alcohol Use: No   OB History    No data available     Review of Systems  Constitutional: Positive for activity change, appetite change and fatigue. Negative for fever and chills.  HENT: Negative for congestion, postnasal drip and rhinorrhea.   Eyes: Negative for pain, redness and visual disturbance.  Respiratory: Negative for cough, chest tightness and shortness of breath.   Cardiovascular: Negative for chest pain and palpitations.  Gastrointestinal: Positive for nausea and vomiting. Negative for abdominal pain, diarrhea and constipation.  Genitourinary: Negative for dysuria, urgency and hematuria.  Musculoskeletal: Negative for myalgias and back pain.  Skin: Negative for rash.  Neurological: Positive for dizziness, weakness (generalized), light-headedness and headaches. Negative for syncope and speech difficulty.  Hematological: Does not bruise/bleed easily.      Allergies  Aspartame and phenylalanine; Tramadol; Codeine; Sulfa antibiotics; Penicillins; and Prednisone  Home Medications   Prior to Admission medications   Medication Sig Start Date End Date Taking? Authorizing Provider  albuterol (PROVENTIL HFA;VENTOLIN HFA) 108 (90 BASE) MCG/ACT inhaler Inhale 1-2 puffs into the lungs every 4 (four) hours as needed. For shortness of breath.  05/27/15  Yes Javier Glazier, MD  albuterol (PROVENTIL) (5 MG/ML) 0.5% nebulizer solution Take 0.5 mLs (2.5 mg total) by nebulization every 4 (four) hours as needed for wheezing or shortness of breath. 04/12/13  Yes Robbie Lis, MD  ipratropium (ATROVENT) 0.02 % nebulizer solution Take 2.5 mLs (0.5 mg total) by nebulization every 4 (four) hours as needed for wheezing. 04/12/13  Yes Robbie Lis, MD  losartan-hydrochlorothiazide (HYZAAR) 100-12.5 MG per tablet Take 1 tablet by mouth daily.   Yes Historical Provider, MD   metFORMIN (GLUCOPHAGE) 500 MG tablet Take 500 mg by mouth 2 (two) times daily with a meal.    Yes Historical Provider, MD  naproxen sodium (ANAPROX) 220 MG tablet Take 220 mg by mouth 2 (two) times daily with a meal.   Yes Historical Provider, MD  tiotropium (SPIRIVA) 18 MCG inhalation capsule Place 1 capsule (18 mcg total) into inhaler and inhale daily. 05/27/15  Yes Javier Glazier, MD  ranitidine (ZANTAC) 150 MG tablet Take 1 tablet (150 mg total) by mouth at bedtime. Patient not taking: Reported on 06/16/2015 05/27/15   Javier Glazier, MD   BP 166/70 mmHg  Pulse 67  Temp(Src) 98.1 F (36.7 C) (Oral)  Resp 20  Ht 5\' 5"  (1.651 m)  Wt 168 lb (76.204 kg)  BMI 27.96 kg/m2  SpO2 96% Physical Exam  Constitutional: She is oriented to person, place, and time. She appears well-developed and well-nourished. No distress.  HENT:  Head: Normocephalic and atraumatic.  Right Ear: External ear normal.  Left Ear: External ear normal.  Mouth/Throat: Oropharynx is clear and moist. No oropharyngeal exudate.  Eyes: EOM are normal. Pupils are equal, round, and reactive to light. Right eye exhibits no nystagmus. Left eye exhibits no nystagmus.  Neck: Normal range of motion. Neck supple.  Cardiovascular: Normal rate, regular rhythm, normal heart sounds and intact distal pulses.   No murmur heard. Pulmonary/Chest: Effort normal. No respiratory distress. She has no wheezes. She has no rales.  Abdominal: Soft. She exhibits no distension. There is no tenderness.  Musculoskeletal: Normal range of motion. She exhibits no edema or tenderness.  Neurological: She is alert and oriented to person, place, and time. She has normal strength. No cranial nerve deficit or sensory deficit. She exhibits normal muscle tone. Coordination normal.  Normal finger to nose on examination  Skin: Skin is warm and dry. No rash noted. She is not diaphoretic.  Vitals reviewed.   ED Course  Procedures (including critical care  time) Labs Review Labs Reviewed  CBC WITH DIFFERENTIAL/PLATELET - Abnormal; Notable for the following:    RBC 5.45 (*)    Hemoglobin 16.4 (*)    HCT 47.8 (*)    All other components within normal limits  COMPREHENSIVE METABOLIC PANEL - Abnormal; Notable for the following:    Calcium 8.8 (*)    GFR calc non Af Amer 58 (*)    All other components within normal limits  URINALYSIS, ROUTINE W REFLEX MICROSCOPIC (NOT AT [facility]) - Abnormal; Notable for the following:    APPearance CLOUDY (*)    All other components within normal limits  LIPASE, BLOOD  TROPONIN I  TROPONIN I  URINE RAPID DRUG SCREEN, HOSP PERFORMED  TROPONIN I  TROPONIN I  COMPREHENSIVE METABOLIC PANEL  CBC  I-STAT CG4 LACTIC ACID, ED    Imaging Review Mr Brain Wo Contrast  06/16/2015   CLINICAL DATA:  Intermittent vertigo, nausea, and vomiting. Near syncopal episodes with intra and headaches.  Diplopia during the episodes of vertigo.  EXAM: MRI HEAD WITHOUT CONTRAST  TECHNIQUE: Multiplanar, multiecho pulse sequences of the brain and surrounding structures were obtained without intravenous contrast.  COMPARISON:  CT head without contrast 06/13/2014. Report of MRI cervical spine 08/10/1998  FINDINGS: Mild generalized atrophy is noted. Moderate periventricular and bilateral subcortical T2 hyperintensities are present bilaterally.  No acute infarct, hemorrhage, or mass lesion is present. The ventricles are of normal size.  Dilated perivascular spaces are present in the basal ganglia bilaterally. The brainstem is within normal limits. The internal auditory canals are normal bilaterally. Flow is present in the major intracranial arteries. The globes and orbits are intact. The paranasal sinuses and mastoid air cells are clear.  Skullbase is within normal limits.  A 3.3 mm syrinx is evident at the level of C3. This extends from C2 to at least C3-4. No discrete mass lesion is present in the upper cervical spine. Midline structures are  otherwise within normal limits.  IMPRESSION: 1. No acute intracranial abnormality. No acute or focal lesion to explain the patient's vertigo. 2. Mild atrophy and moderate diffuse white matter disease. This is nonspecific, but likely reflects the sequela of chronic microvascular disease. 3. 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 Recommend MRI of the cervical spine without and with contrast for further evaluation. This was not reported on the previous study.   Electronically Signed   By: San Morelle M.D.   On: 06/16/2015 21:13   Dg Abd Acute W/chest  06/16/2015   CLINICAL DATA:  Chest pain and vomiting since yesterday.  EXAM: DG ABDOMEN ACUTE W/ 1V CHEST  COMPARISON:  October 08, 2014  FINDINGS: There is no evidence of dilated bowel loops or free intraperitoneal air. Extensive bowel content is identified throughout colon. Prior cholecystectomy clips are noted. No radiopaque calculi or other significant radiographic abnormality is otherwise seen. Heart size and mediastinal contours are within normal limits. Both lungs are clear.  IMPRESSION: No bowel obstruction.  Constipation.  No acute cardiopulmonary disease.   Electronically Signed   By: Abelardo Diesel M.D.   On: 06/16/2015 17:49   I have personally reviewed and evaluated these images and lab results as part of my medical decision-making.   EKG Interpretation   Date/Time:  Wednesday June 16 2015 15:50:54 EDT Ventricular Rate:  75 PR Interval:  168 QRS Duration: 79 QT Interval:  406 QTC Calculation: 453 R Axis:   47 Text Interpretation:  Sinus rhythm T wave flattening in I and aVL, V1 and  V2 No significant change since last tracing Confirmed by Devaunte Gasparini  [phone]) on 06/16/2015 4:01:49 PM      MDM  Patient was seen and evaluated in stable condition.  Patient was given IV fluids, zofran, antivert for her symptoms.  Laboratory studies and acute abdominal series were unremarkable.  Patient did have improvement in symptoms but  was unable to hold down fluids.  As patient was not able to tolerate PO and continued to be symptomatic the case was discussed with Dr. Hal Hope who agreed with admission and patient was admitted under his care. Final diagnoses:  Intractable vomiting with nausea, vomiting of unspecified type    1. Acute and intractable nausea and vomiting      Harvel Quale, MD 06/17/15 [phone]

## 2015-06-16 NOTE — H&P (Signed)
[facility] History and Physical  Victoria Bird RXV:[id number] DOB: 30-Dec-1945 DOA: 06/16/2015  Referring physician: Dr.Nguyen. PCP: Linden Dolin, PA-C  Specialists: Dr.Nahser. Cardiologist.  Chief Complaint: Dizziness with nausea vomiting.  HPI: Victoria Bird is a 69 y.o. female with history of diabetes mellitus type 2, COPD, hypertension, presents to the ER because of persistent dizziness with nausea and vomiting. Patient's symptoms started yesterday. Patient feels dizzy on making any moment. Denies any tinnitus or decreased hearing. Dentition patient has been having persistent nausea and vomiting. Abdomen appears benign on exam an acute abdominal series is unremarkable. Patient also has been having chest pain off and on and was already scheduled to have stress test this morning by cardiologist for workup on patient's known thyroid nodule for surgery. On exam patient is appearing nonfocal. Patient initially had some chest pain on arrival which has resolved at this time. His pain is usually retrosternal stabbing in nature presently I sometimes on exertion. Denies any diarrhea. Vomitus has not had any blood. Denies any recent change in medications.  Review of Systems: As presented in the history of presenting illness, rest negative.  Past Medical History  Diagnosis Date  . Diabetes mellitus   . Hypertension   . COPD (chronic obstructive pulmonary disease)   . Chronic pain   . Anxiety   . Tracheobronchitis 01/01/2012  . High cholesterol   . OSA (obstructive sleep apnea)   . H/O: pneumonia    Past Surgical History  Procedure Laterality Date  . Appendectomy    . Cholecystectomy    . Abdominal hysterectomy    . Fracture surgery    . Rotator cuff repair    . Carpal tunnel release Left    Social History:  reports that she has been smoking Cigarettes.  She started smoking about 41 years ago. She has a 20.5 pack-year smoking history. She has never used smokeless  tobacco. She reports that she does not drink alcohol or use illicit drugs. Where does patient live at home. Can patient participate in ADLs? Yes.  Allergies  Allergen Reactions  . Aspartame And Phenylalanine Nausea And Vomiting    Patient says she is allergic to all artificial sweeteners  . Tramadol Shortness Of Breath and Nausea Only  . Codeine Itching  . Sulfa Antibiotics Other (See Comments)    Kidney problem   . Penicillins Rash    Has patient had a PCN reaction causing immediate rash, facial/tongue/throat swelling, SOB or lightheadedness with hypotension: No Has patient had a PCN reaction causing severe rash involving mucus membranes or skin necrosis: No Has patient had a PCN reaction that required hospitalization No Has patient had a PCN reaction occurring within the last 10 years: No If all of the above answers are "NO", then may proceed with Cephalosporin use.  . Prednisone Rash    Patient stated she received Prednisone while she was in the hospital and experienced a rash and "extreme pain.'    Family History:  Family History  Problem Relation Age of Onset  . Heart attack Mother   . Diabetes Maternal Grandfather   . Thyroid disease Neg Hx   . Lung disease Neg Hx   . Heart attack Maternal Grandfather   . Heart attack Maternal Grandmother   . Hypertension Maternal Grandmother   . Hypertension Maternal Grandfather   . Stroke Maternal Grandfather       Prior to Admission medications   Medication Sig Start Date End Date Taking? Authorizing Provider  albuterol (PROVENTIL  HFA;VENTOLIN HFA) 108 (90 BASE) MCG/ACT inhaler Inhale 1-2 puffs into the lungs every 4 (four) hours as needed. For shortness of breath. 05/27/15  Yes Javier Glazier, MD  albuterol (PROVENTIL) (5 MG/ML) 0.5% nebulizer solution Take 0.5 mLs (2.5 mg total) by nebulization every 4 (four) hours as needed for wheezing or shortness of breath. 04/12/13  Yes Robbie Lis, MD  ipratropium (ATROVENT) 0.02 %  nebulizer solution Take 2.5 mLs (0.5 mg total) by nebulization every 4 (four) hours as needed for wheezing. 04/12/13  Yes Robbie Lis, MD  losartan-hydrochlorothiazide (HYZAAR) 100-12.5 MG per tablet Take 1 tablet by mouth daily.   Yes Historical Provider, MD  metFORMIN (GLUCOPHAGE) 500 MG tablet Take 500 mg by mouth 2 (two) times daily with a meal.    Yes Historical Provider, MD  naproxen sodium (ANAPROX) 220 MG tablet Take 220 mg by mouth 2 (two) times daily with a meal.   Yes Historical Provider, MD  tiotropium (SPIRIVA) 18 MCG inhalation capsule Place 1 capsule (18 mcg total) into inhaler and inhale daily. 05/27/15  Yes Javier Glazier, MD  ranitidine (ZANTAC) 150 MG tablet Take 1 tablet (150 mg total) by mouth at bedtime. Patient not taking: Reported on 06/16/2015 05/27/15   Javier Glazier, MD    Physical Exam: Filed Vitals:   06/16/15 1540 06/16/15 1818 06/16/15 1818 06/16/15 2010  BP: 141/66 143/63 130/54 163/69  Pulse: 72 64 67 65  Temp:   97.7 F (36.5 C)   TempSrc:   Oral   Resp: 21 14 23 20   SpO2: 92% 98% 97% 94%     General:  Moderately built and nourished.  Eyes: Anicteric pallor.  ENT: No discharge from the ears eyes nose and mouth.  Neck: No mass felt. No JVD appreciated.  Cardiovascular: S1 and S2 heard.  Respiratory: No rhonchi or crepitations.  Abdomen: Soft nontender bowel sounds present. No guarding or rigidity.  Skin: No rash.  Musculoskeletal: No edema.  Psychiatric: Appears normal.  Neurologic: Alert awake oriented to time place and person. Moves all extremities 5 x 5. No facial asymmetry. Tongue is midline. Mild nystagmus on the to the left side.  Labs on Admission:  Basic Metabolic Panel:  Recent Labs Lab 06/16/15 1548  NA 138  K 3.8  CL 103  CO2 30  GLUCOSE 98  BUN 13  CREATININE 0.98  CALCIUM 8.8*   Liver Function Tests:  Recent Labs Lab 06/16/15 1548  AST 17  ALT 14  ALKPHOS 65  BILITOT 0.7  PROT 7.3  ALBUMIN 3.9     Recent Labs Lab 06/16/15 1548  LIPASE 26   No results for input(s): AMMONIA in the last 168 hours. CBC:  Recent Labs Lab 06/16/15 1548  WBC 9.9  NEUTROABS 6.1  HGB 16.4*  HCT 47.8*  MCV 87.7  PLT 192   Cardiac Enzymes:  Recent Labs Lab 06/16/15 1548  TROPONINI <0.03    BNP (last 3 results) No results for input(s): BNP in the last 8760 hours.  ProBNP (last 3 results) No results for input(s): PROBNP in the last 8760 hours.  CBG: No results for input(s): GLUCAP in the last 168 hours.  Radiological Exams on Admission: Dg Abd Acute W/chest  06/16/2015   CLINICAL DATA:  Chest pain and vomiting since yesterday.  EXAM: DG ABDOMEN ACUTE W/ 1V CHEST  COMPARISON:  October 08, 2014  FINDINGS: There is no evidence of dilated bowel loops or free intraperitoneal air. Extensive bowel content is  identified throughout colon. Prior cholecystectomy clips are noted. No radiopaque calculi or other significant radiographic abnormality is otherwise seen. Heart size and mediastinal contours are within normal limits. Both lungs are clear.  IMPRESSION: No bowel obstruction.  Constipation.  No acute cardiopulmonary disease.   Electronically Signed   By: Abelardo Diesel M.D.   On: 06/16/2015 17:49    EKG: Independently reviewed. Normal sinus rhythm.  Assessment/Plan Principal Problem:   Vertigo Active Problems:   Hypertension   COPD (chronic obstructive pulmonary disease)   Thyroid nodule   Chest pain   Nausea and vomiting   Diabetes mellitus type 2, controlled   1. Dizziness with nausea vomiting most likely secondary to benign positional vertigo - MRI brain is pending. If negative will place patient on meclizine and physical therapy. Patient also states patient get some crampy abdominal pain in the upper quadrants day nausea vomiting which also be secondary to other causes including diabetic gastroparesis. Acute abdominal series is unremarkable. If nausea vomiting persist may need other  studies. Patient had previous history of cholecystectomy and appendectomy. 2. Chest pain - patient has been having chest pain off and on and was originally scheduled to have stres test earlier today. Patient has had brief chest pain when patient arrived to the ER. Presley chest pain-free. We will cycle cardiac markers and keep patient nothing by mouth in a.m. anticipation of possible stress test for which I have requested cardiology consult. 3. Hypertension - continue home medications. 4. Diabetes mellitus type 2 - patient does not want to be on insulin sliding scale as patient states she is antibody donor. Wants to continue metformin. 5. COPD - presently not wheezing continue inhalers. Patient has been following up with pulmonologist for surgical clearance for thyroid noted. 6. Thyroid nodule - workup as outpatient.  I have reviewed patient's old charts are labs. Personally reviewed patient's x-ray and EKG.   DVT Prophylaxis Lovenox.  Code Status: Full code.  Family Communication: Discussed with patient.  Disposition Plan: Admit for observation.    Caty Tessler N. [facility] Pager [phone].  If 7PM-7AM, please contact night-coverage [email or web address] Password [email or web address] 06/16/2015, 8:24 PM

## 2015-06-16 NOTE — ED Notes (Signed)
Pt currently using the restroom with obtain bloodwork when pt returns.

## 2015-06-16 NOTE — Telephone Encounter (Signed)
Patient cancelled [employer] today.The patient is complaining of vomiting all night. The patient's daughter is going to take her to ED for evaluation per patient. The patient will call back to reschedule. Oletta Lamas, Zhane Bluitt A

## 2015-06-16 NOTE — ED Notes (Signed)
She states that she has had occasional near-syncopal episodes with intermittent h/a; chest pains; aches and "vertigo with the room spinning and I have double vision while it's happening".  She states she was recently found to have "spots on my thyroid that were biopsied and found to be abnormal and I'm supposed to have my thyroid out".  She states she was supposed to undergo a chemical stress test today with a local cardiologist; however, she developed "bad vertigo"  And vomiting last night.  Vomiting persists; but the vertigo is somewhat improved.

## 2015-06-16 NOTE — Telephone Encounter (Signed)
Agree with rescheduling the myoview since she has severe nausea and vomitting

## 2015-06-17 ENCOUNTER — Ambulatory Visit: Payer: Medicare Other

## 2015-06-17 ENCOUNTER — Observation Stay (HOSPITAL_COMMUNITY): Payer: Medicare Other

## 2015-06-17 ENCOUNTER — Telehealth: Payer: Self-pay | Admitting: Cardiology

## 2015-06-17 ENCOUNTER — Ambulatory Visit: Payer: Medicare Other | Admitting: Pulmonary Disease

## 2015-06-17 DIAGNOSIS — I10 Essential (primary) hypertension: Secondary | ICD-10-CM

## 2015-06-17 DIAGNOSIS — R079 Chest pain, unspecified: Secondary | ICD-10-CM | POA: Diagnosis not present

## 2015-06-17 DIAGNOSIS — E119 Type 2 diabetes mellitus without complications: Secondary | ICD-10-CM | POA: Diagnosis not present

## 2015-06-17 DIAGNOSIS — J410 Simple chronic bronchitis: Secondary | ICD-10-CM | POA: Diagnosis not present

## 2015-06-17 DIAGNOSIS — R42 Dizziness and giddiness: Secondary | ICD-10-CM | POA: Diagnosis not present

## 2015-06-17 DIAGNOSIS — R55 Syncope and collapse: Secondary | ICD-10-CM | POA: Diagnosis not present

## 2015-06-17 DIAGNOSIS — R112 Nausea with vomiting, unspecified: Secondary | ICD-10-CM

## 2015-06-17 DIAGNOSIS — R51 Headache: Secondary | ICD-10-CM | POA: Diagnosis not present

## 2015-06-17 DIAGNOSIS — E041 Nontoxic single thyroid nodule: Secondary | ICD-10-CM

## 2015-06-17 LAB — CBC
HCT: 45.2 % (ref 36.0–46.0)
Hemoglobin: 15 g/dL (ref 12.0–15.0)
MCH: 29.3 pg (ref 26.0–34.0)
MCHC: 33.2 g/dL (ref 30.0–36.0)
MCV: 88.3 fL (ref 78.0–100.0)
PLATELETS: 156 10*3/uL (ref 150–400)
RBC: 5.12 MIL/uL — ABNORMAL HIGH (ref 3.87–5.11)
RDW: 13.5 % (ref 11.5–15.5)
WBC: 8.7 10*3/uL (ref 4.0–10.5)

## 2015-06-17 LAB — COMPREHENSIVE METABOLIC PANEL
ALBUMIN: 3.2 g/dL — AB (ref 3.5–5.0)
ALK PHOS: 53 U/L (ref 38–126)
ALT: 13 U/L — AB (ref 14–54)
ANION GAP: 5 (ref 5–15)
AST: 17 U/L (ref 15–41)
BILIRUBIN TOTAL: 0.7 mg/dL (ref 0.3–1.2)
BUN: 10 mg/dL (ref 6–20)
CALCIUM: 7.7 mg/dL — AB (ref 8.9–10.3)
CO2: 25 mmol/L (ref 22–32)
CREATININE: 0.88 mg/dL (ref 0.44–1.00)
Chloride: 110 mmol/L (ref 101–111)
GFR calc Af Amer: >60 mL/min (ref >60)
GFR calc non Af Amer: >60 mL/min (ref >60)
GLUCOSE: 98 mg/dL (ref 65–99)
Potassium: 4 mmol/L (ref 3.5–5.1)
Sodium: 140 mmol/L (ref 135–145)
Total Protein: 5.9 g/dL — ABNORMAL LOW (ref 6.5–8.1)

## 2015-06-17 LAB — TROPONIN I
Troponin I: <0.03 ng/mL (ref <0.031)
Troponin I: <0.03 ng/mL (ref <0.031)

## 2015-06-17 LAB — GLUCOSE, CAPILLARY
GLUCOSE-CAPILLARY: 97 mg/dL (ref 65–99)
Glucose-Capillary: 124 mg/dL — ABNORMAL HIGH (ref 65–99)

## 2015-06-17 MED ORDER — OXYCODONE HCL 5 MG PO TABS
5.0000 mg | ORAL_TABLET | Freq: Four times a day (QID) | ORAL | Status: DC | PRN
  Administered 2015-06-17 – 2015-06-18 (×3): 5 mg via ORAL
  Filled 2015-06-17 (×3): qty 1

## 2015-06-17 MED ORDER — INSULIN ASPART 100 UNIT/ML ~~LOC~~ SOLN
0.0000 [IU] | Freq: Three times a day (TID) | SUBCUTANEOUS | Status: DC
  Administered 2015-06-17: 1 [IU] via SUBCUTANEOUS
  Administered 2015-06-18: 3 [IU] via SUBCUTANEOUS

## 2015-06-17 MED ORDER — GADOBENATE DIMEGLUMINE 529 MG/ML IV SOLN
15.0000 mL | Freq: Once | INTRAVENOUS | Status: AC | PRN
  Administered 2015-06-17: 15 mL via INTRAVENOUS

## 2015-06-17 MED ORDER — PANTOPRAZOLE SODIUM 40 MG PO TBEC
40.0000 mg | DELAYED_RELEASE_TABLET | Freq: Every day | ORAL | Status: DC
  Administered 2015-06-17 – 2015-06-18 (×2): 40 mg via ORAL
  Filled 2015-06-17 (×2): qty 1

## 2015-06-17 MED ORDER — ACETAMINOPHEN 325 MG PO TABS
650.0000 mg | ORAL_TABLET | Freq: Four times a day (QID) | ORAL | Status: DC | PRN
  Administered 2015-06-17: 650 mg via ORAL
  Filled 2015-06-17: qty 2

## 2015-06-17 MED ORDER — MECLIZINE HCL 25 MG PO TABS: 25.0000 mg | ORAL_TABLET | Freq: Three times a day (TID) | ORAL | Status: DC | PRN

## 2015-06-17 MED ORDER — LORAZEPAM 0.5 MG PO TABS: 0.5000 mg | ORAL_TABLET | Freq: Four times a day (QID) | ORAL | Status: DC | PRN

## 2015-06-17 MED ORDER — MECLIZINE HCL 25 MG PO TABS
25.0000 mg | ORAL_TABLET | Freq: Three times a day (TID) | ORAL | Status: DC
  Administered 2015-06-17 – 2015-06-18 (×3): 25 mg via ORAL
  Filled 2015-06-17 (×4): qty 1

## 2015-06-17 MED ORDER — ONDANSETRON HCL 4 MG/2ML IJ SOLN
4.0000 mg | Freq: Four times a day (QID) | INTRAMUSCULAR | Status: DC | PRN
  Administered 2015-06-17: 4 mg via INTRAVENOUS
  Filled 2015-06-17: qty 2

## 2015-06-17 NOTE — Telephone Encounter (Signed)
Please make sure patient's Victoria Bird is rescheduled for next Tuesday or Wednesday.  This was cancelled this week due to severe vertigo.

## 2015-06-17 NOTE — Progress Notes (Signed)
2D echo with normal LVF.  Will set up nuclear stress test next Tuesday or Wednesday once vertigo has resolved.  Will sign off.  Call with questions.

## 2015-06-17 NOTE — Progress Notes (Signed)
PT Cancellation Note  Patient Details Name: NIKOLETA DADY MRN: [id number] DOB: [birth year]   Cancelled Treatment:    Reason Eval/Treat Not Completed: Medical issues which prohibited therapy Would prefer to await results of MRI c-spine prior to PT vestibular evaluation (which requires head and neck movement).   Oaklyn Jakubek,[name] 06/17/2015, 2:56 PM [name], PT, DPT 06/17/2015 Pager: [phone]

## 2015-06-17 NOTE — Consult Note (Signed)
Patient ID: Victoria Bird MRN: [id number], DOB/AGE: [birth year]   Admit date: 06/16/2015   Primary Physician: Linden Dolin, PA-C Primary Cardiologist: Dr. Acie Fredrickson  Pt. Profile:  69 y/o female with a newly diagnosed thyroid nodule and PMH of HTN, DM, tobacco abuse but no known coronary disease, being evaluated for chest pain. However, she was primarily admitted for vertigo. She was seen in our office by Dr. Acie Fredrickson, the day prior to arrival, who ordered for her to undergo a Lexiscan Myoview.   Problem List  Past Medical History  Diagnosis Date  . Diabetes mellitus   . Hypertension   . COPD (chronic obstructive pulmonary disease)   . Chronic pain   . Anxiety   . Tracheobronchitis 01/01/2012  . High cholesterol   . OSA (obstructive sleep apnea)   . H/O: pneumonia     Past Surgical History  Procedure Laterality Date  . Appendectomy    . Cholecystectomy    . Abdominal hysterectomy    . Fracture surgery    . Rotator cuff repair    . Carpal tunnel release Left      Allergies  Allergies  Allergen Reactions  . Aspartame And Phenylalanine Nausea And Vomiting    Patient says she is allergic to all artificial sweeteners  . Tramadol Shortness Of Breath and Nausea Only  . Codeine Itching  . Sulfa Antibiotics Other (See Comments)    Kidney problem   . Penicillins Rash    Has patient had a PCN reaction causing immediate rash, facial/tongue/throat swelling, SOB or lightheadedness with hypotension: No Has patient had a PCN reaction causing severe rash involving mucus membranes or skin necrosis: No Has patient had a PCN reaction that required hospitalization No Has patient had a PCN reaction occurring within the last 10 years: No If all of the above answers are "NO", then may proceed with Cephalosporin use.  . Prednisone Rash    Patient stated she received Prednisone while she was in the hospital and experienced a rash and "extreme pain.'    HPI  The patient is a 69  y/o female with no known coronary disease. Her cardiac risk factors include HTN, DM and a long history of tobacco use.   She was recently evaluated in clinic by Dr. Acie Fredrickson on 06/15/15 for complaints of chest pain as well as for surgical clearance for upcomming thyroidectomy (has a thyroid nodule +abnormal biopsy). She was ordered to undergo a Lexiscan Myoview to further evaluate to rule out coronary disease prior to undergoing surgery. The nuclear study was scheduled for 06/16/15 however the patient called to cancel after she had developed severe case of vertigo with emesis.   Her severe case of vertigo prompted her to report to the [name] ED on 9/14. Brain MRI showed no acute of focal lesion to explain her vertigo. She has been placed on meclizine and outpatient physical therapy has been recommend.  Also, on arrival to the ED, she noted some mild recurrent chest discomfort that lasted only a few minutes and she has had no further recurrence. She is a very poor historian but notes history of both sharp and dull substernal pain that often radiates to her back and sometimes her left shoulder. She denies any exertional symptoms and no dyspnea. The duration of her episodes at home have varied but have lasted hrs at a time. She is currently CP free.  Her cardiac enzymes are negative x 3. EKG shows sinus rhythm with mild T wave flattening  in I and aVL, V1 and V2.   Home Medications  Prior to Admission medications   Medication Sig Start Date End Date Taking? Authorizing Provider  albuterol (PROVENTIL HFA;VENTOLIN HFA) 108 (90 BASE) MCG/ACT inhaler Inhale 1-2 puffs into the lungs every 4 (four) hours as needed. For shortness of breath. 05/27/15  Yes Javier Glazier, MD  albuterol (PROVENTIL) (5 MG/ML) 0.5% nebulizer solution Take 0.5 mLs (2.5 mg total) by nebulization every 4 (four) hours as needed for wheezing or shortness of breath. 04/12/13  Yes Robbie Lis, MD  ipratropium (ATROVENT) 0.02 % nebulizer  solution Take 2.5 mLs (0.5 mg total) by nebulization every 4 (four) hours as needed for wheezing. 04/12/13  Yes Robbie Lis, MD  losartan-hydrochlorothiazide (HYZAAR) 100-12.5 MG per tablet Take 1 tablet by mouth daily.   Yes Historical Provider, MD  metFORMIN (GLUCOPHAGE) 500 MG tablet Take 500 mg by mouth 2 (two) times daily with a meal.    Yes Historical Provider, MD  naproxen sodium (ANAPROX) 220 MG tablet Take 220 mg by mouth 2 (two) times daily with a meal.   Yes Historical Provider, MD  tiotropium (SPIRIVA) 18 MCG inhalation capsule Place 1 capsule (18 mcg total) into inhaler and inhale daily. 05/27/15  Yes Javier Glazier, MD  ranitidine (ZANTAC) 150 MG tablet Take 1 tablet (150 mg total) by mouth at bedtime. Patient not taking: Reported on 06/16/2015 05/27/15   Javier Glazier, MD    Family History  Family History  Problem Relation Age of Onset  . Heart attack Mother   . Diabetes Maternal Grandfather   . Thyroid disease Neg Hx   . Lung disease Neg Hx   . Heart attack Maternal Grandfather   . Heart attack Maternal Grandmother   . Hypertension Maternal Grandmother   . Hypertension Maternal Grandfather   . Stroke Maternal Grandfather     Social History  Social History   Social History  . Marital Status: Divorced    Spouse Name: N/A  . Number of Children: Y  . Years of Education: N/A   Occupational History  . unemployed    Social History Main Topics  . Smoking status: Current Every Day Smoker -- 0.50 packs/day for 41 years    Types: Cigarettes    Start date: 03/19/1974  . Smokeless tobacco: Never Used     Comment: Peak rate of 1.5ppd  . Alcohol Use: No  . Drug Use: No  . Sexual Activity: No   Other Topics Concern  . Not on file   Social History Narrative   Daughter Edmonia Lynch) [phone]   Originally from [place]. Previously lived in [place]. She has traveled from [name] to [place] & has also traveled across country to [place]. Multiple visits to [place],  [place], [place], [place], [place]. She has a dog at home. No bird, mold, or hot tub exposure. She reports some years ago she did live in a home that had black mold in her closet. 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            Review of Systems General:  No chills, fever, night sweats or weight changes.  Cardiovascular:  No chest pain, dyspnea on exertion, edema, orthopnea, palpitations, paroxysmal nocturnal dyspnea. Dermatological: No rash, lesions/masses Respiratory: No cough, dyspnea Urologic: No hematuria, dysuria Abdominal:   No nausea, vomiting, diarrhea, bright red blood  per rectum, melena, or hematemesis Neurologic:  No visual changes, wkns, changes in mental status. All other systems reviewed and are otherwise negative except as noted above.  Physical Exam  Blood pressure 133/44, pulse 79, temperature 98 F (36.7 C), temperature source Oral, resp. rate 20, height 5\' 5"  (1.651 m), weight 168 lb (76.204 kg), SpO2 94 %.  General: Pleasant, NAD Psych: Normal affect. Neuro: Alert and oriented X 3. Moves all extremities spontaneously. HEENT: Normal  Neck: Supple without bruits or JVD. Lungs:  Resp regular and unlabored, diffuse expiratory wheezing . Heart: RRR no s3, s4, or murmurs. Abdomen: Soft, non-tender, non-distended, BS + x 4.  Extremities: No clubbing, cyanosis or edema. DP/PT/Radials 2+ and equal bilaterally.  Labs  Troponin (Point of Care Test) No results for input(s): TROPIPOC in the last 72 hours.  Recent Labs  06/16/15 1548 06/16/15 2310 06/17/15 0350  TROPONINI <0.03 <0.03 <0.03   Lab Results  Component Value Date   WBC 8.7 06/17/2015   HGB 15.0 06/17/2015   HCT 45.2 06/17/2015   MCV 88.3 06/17/2015   PLT 156 06/17/2015    Recent Labs Lab 06/17/15 0350  NA 140  K 4.0  CL 110  CO2 25  BUN 10  CREATININE 0.88  CALCIUM 7.7*  PROT 5.9*   BILITOT 0.7  ALKPHOS 53  ALT 13*  AST 17  GLUCOSE 98   No results found for: CHOL, HDL, LDLCALC, TRIG No results found for: DDIMER   Radiology/Studies  Mr Brain Wo Contrast  06/16/2015   CLINICAL DATA:  Intermittent vertigo, nausea, and vomiting. Near syncopal episodes with intra and headaches. Diplopia during the episodes of vertigo.  EXAM: MRI HEAD WITHOUT CONTRAST  TECHNIQUE: Multiplanar, multiecho pulse sequences of the brain and surrounding structures were obtained without intravenous contrast.  COMPARISON:  CT head without contrast 06/13/2014. Report of MRI cervical spine 08/10/1998  FINDINGS: Mild generalized atrophy is noted. Moderate periventricular and bilateral subcortical T2 hyperintensities are present bilaterally.  No acute infarct, hemorrhage, or mass lesion is present. The ventricles are of normal size.  Dilated perivascular spaces are present in the basal ganglia bilaterally. The brainstem is within normal limits. The internal auditory canals are normal bilaterally. Flow is present in the major intracranial arteries. The globes and orbits are intact. The paranasal sinuses and mastoid air cells are clear.  Skullbase is within normal limits.  A 3.3 mm syrinx is evident at the level of C3. This extends from C2 to at least C3-4. No discrete mass lesion is present in the upper cervical spine. Midline structures are otherwise within normal limits.  IMPRESSION: 1. No acute intracranial abnormality. No acute or focal lesion to explain the patient's vertigo. 2. Mild atrophy and moderate diffuse white matter disease. This is nonspecific, but likely reflects the sequela of chronic microvascular disease. 3. 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 Recommend MRI of the cervical spine without and with contrast for further evaluation. This was not reported on the previous study.   Electronically Signed   By: San Morelle M.D.   On: 06/16/2015 21:13   Dg Abd Acute W/chest  06/16/2015    CLINICAL DATA:  Chest pain and vomiting since yesterday.  EXAM: DG ABDOMEN ACUTE W/ 1V CHEST  COMPARISON:  October 08, 2014  FINDINGS: There is no evidence of dilated bowel loops or free intraperitoneal air. Extensive bowel content is identified throughout colon. Prior cholecystectomy clips are noted. No radiopaque calculi or other significant radiographic abnormality is otherwise seen.  Heart size and mediastinal contours are within normal limits. Both lungs are clear.  IMPRESSION: No bowel obstruction.  Constipation.  No acute cardiopulmonary disease.   Electronically Signed   By: Abelardo Diesel M.D.   On: 06/16/2015 17:49    ECG  NSR with mild T wave flattening in I and aVL, V1 and V2   ASSESSMENT AND PLAN  Principal Problem:   Vertigo Active Problems:   Hypertension   COPD (chronic obstructive pulmonary disease)   Thyroid nodule   Chest pain   Nausea and vomiting   Diabetes mellitus type 2, controlled  1. Chest Pain:  Patient is a poor historian and she notes several types of chest pain, some sharp and some dull. Her cardiac enzymes are negative x 3. EKG is not too concerning. However she had multiple risk factors for CAD including a long h/o tobacco abuse, HTN and DM. Agree that she needs a stress test prior to undergoing surgery. However, will delay stress test until tomorrow as she is still having symptoms of vertigo.   2. HTN: BP is controlled.  3. DM: management per primary. On metformin.   4. Tobacco Abuse: smoking cessation advised.  5. Thyroid Nodule: biopsy abnormal. Thyroidectomy after ischemic w/u is complete.   6. Vertigo: still with symptoms. Management per IM.   Signed, Lyda Jester, PA-C 06/17/2015, 7:41 AM

## 2015-06-17 NOTE — Progress Notes (Signed)
PATIENT DETAILS Name: Victoria Bird Age: 69 y.o. Sex: female Date of Birth: 1946-09-01 Admit Date: 06/16/2015 Admitting Physician [name], MD AGT:XMIWOEHO, Misty A, PA-C  Subjective: Still complains of vertigo. No further chest pain.  Assessment/Plan: Principal Problem: Vertigo: MRI brain negative-suspicion for peripheral vertigo. No improvement since admission-continues to have vertigo-mostly on movement. Continue meclizine, a vestibular physical therapy ordered. At as needed lorazepam. Home when vertigo improves and patient able to ambulate.  Active Problems: Nausea with vomiting: Secondary to above. Supportive care. Abdomen  Chest pain: Resolved, cardiac enzymes and negative. Await echocardiogram. Plans are for outpatient new distress status per cardiology.   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  Hypertension: Moderate controlled-continue losartan. Follow and optimize accordingly.  Type 2 diabetes: Hold metformin, add SSI and follow CBG trend.  COPD (chronic obstructive pulmonary disease): Lungs clear-continue Spiriva and as needed bronchodilators.  Thyroid nodule: Completed FNAC as outpatient-patient she is scheduled for a thyroidectomy at some point.  GERD: PPI.  Disposition: Remain inpatient-suspect home with home health services when vertigo much better.  Antimicrobial agents  See below  Anti-infectives    None      DVT Prophylaxis: Prophylactic Lovenox  Code Status: Full code   Family Communication None at bedside  Procedures: None  CONSULTS:  cardiology  Time spent 25 minutes-Greater than 50% of this time was spent in counseling, explanation of diagnosis, planning of further management, and coordination of care.  MEDICATIONS: Scheduled Meds: . aspirin  300 mg Rectal Daily   Or  . aspirin  325 mg Oral Daily  . enoxaparin (LOVENOX) injection  40 mg Subcutaneous Q24H  . losartan  100 mg Oral  Daily  . meclizine  25 mg Oral TID  . metFORMIN  500 mg Oral BID WC  . pantoprazole  40 mg Oral Daily  . tiotropium  18 mcg Inhalation Daily   Continuous Infusions:  PRN Meds:.acetaminophen, albuterol, albuterol, hydrALAZINE, LORazepam, ondansetron (ZOFRAN) IV, oxyCODONE, senna-docusate    PHYSICAL EXAM: Vital signs in last 24 hours: Filed Vitals:   06/16/15 2155 06/17/15 0007 06/17/15 0351 06/17/15 0756  BP: 166/70  133/44   Pulse: 67  79   Temp: 98.1 F (36.7 C)  98 F (36.7 C)   TempSrc: Oral  Oral   Resp:   20   Height:  5\' 5"  (1.651 m)    Weight:  76.204 kg (168 lb)    SpO2: 96%  94% 93%    Weight change:  Filed Weights   06/17/15 0007  Weight: 76.204 kg (168 lb)   Body mass index is 27.96 kg/(m^2).   Gen Exam: Awake and alert with clear speech.   Neck: Supple, No JVD.   Chest: B/L Clear.   CVS: S1 S2 Regular, no murmurs.  Abdomen: soft, BS +, non tender, non distended.  Extremities: no edema, lower extremities warm to touch. Neurologic: Non Focal.   Skin: No Rash.   Wounds: N/A.   Intake/Output from previous day:  Intake/Output Summary (Last 24 hours) at 06/17/15 1408 Last data filed at 06/16/15 2130  Gross per 24 hour  Intake      0 ml  Output    200 ml  Net   -200 ml     LAB RESULTS: CBC  Recent Labs Lab 06/16/15 1548 06/17/15 0350  WBC 9.9 8.7  HGB 16.4* 15.0  HCT 47.8* 45.2  PLT  192 156  MCV 87.7 88.3  MCH 30.1 29.3  MCHC 34.3 33.2  RDW 13.5 13.5  LYMPHSABS 2.9  --   MONOABS 0.6  --   EOSABS 0.2  --   BASOSABS 0.0  --     Chemistries   Recent Labs Lab 06/16/15 1548 06/17/15 0350  NA 138 140  K 3.8 4.0  CL 103 110  CO2 30 25  GLUCOSE 98 98  BUN 13 10  CREATININE 0.98 0.88  CALCIUM 8.8* 7.7*    CBG:  Recent Labs Lab 06/17/15 0843  GLUCAP 97    GFR Estimated Creatinine Clearance: 62.5 mL/min (by C-G formula based on Cr of 0.88).  Coagulation profile No results for input(s): INR, PROTIME in the last 168  hours.  Cardiac Enzymes  Recent Labs Lab 06/16/15 2310 06/17/15 0350 06/17/15 0951  TROPONINI <0.03 <0.03 <0.03    Invalid input(s): POCBNP No results for input(s): DDIMER in the last 72 hours. No results for input(s): HGBA1C in the last 72 hours. No results for input(s): CHOL, HDL, LDLCALC, TRIG, CHOLHDL, LDLDIRECT in the last 72 hours. No results for input(s): TSH, T4TOTAL, T3FREE, THYROIDAB in the last 72 hours.  Invalid input(s): FREET3 No results for input(s): VITAMINB12, FOLATE, FERRITIN, TIBC, IRON, RETICCTPCT in the last 72 hours.  Recent Labs  06/16/15 1548  LIPASE 26    Urine Studies No results for input(s): UHGB, CRYS in the last 72 hours.  Invalid input(s): UACOL, UAPR, USPG, UPH, UTP, UGL, UKET, UBIL, UNIT, UROB, ULEU, UEPI, UWBC, URBC, UBAC, CAST, UCOM, BILUA  MICROBIOLOGY: No results found for this or any previous visit (from the past 240 hour(s)).  RADIOLOGY STUDIES/RESULTS: Mr Herby Abraham Contrast  06/16/2015   CLINICAL DATA:  Intermittent vertigo, nausea, and vomiting. Near syncopal episodes with intra and headaches. Diplopia during the episodes of vertigo.  EXAM: MRI HEAD WITHOUT CONTRAST  TECHNIQUE: Multiplanar, multiecho pulse sequences of the brain and surrounding structures were obtained without intravenous contrast.  COMPARISON:  CT head without contrast 06/13/2014. Report of MRI cervical spine 08/10/1998  FINDINGS: Mild generalized atrophy is noted. Moderate periventricular and bilateral subcortical T2 hyperintensities are present bilaterally.  No acute infarct, hemorrhage, or mass lesion is present. The ventricles are of normal size.  Dilated perivascular spaces are present in the basal ganglia bilaterally. The brainstem is within normal limits. The internal auditory canals are normal bilaterally. Flow is present in the major intracranial arteries. The globes and orbits are intact. The paranasal sinuses and mastoid air cells are clear.  Skullbase is within  normal limits.  A 3.3 mm syrinx is evident at the level of C3. This extends from C2 to at least C3-4. No discrete mass lesion is present in the upper cervical spine. Midline structures are otherwise within normal limits.  IMPRESSION: 1. No acute intracranial abnormality. No acute or focal lesion to explain the patient's vertigo. 2. Mild atrophy and moderate diffuse white matter disease. This is nonspecific, but likely reflects the sequela of chronic microvascular disease. 3. 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 Recommend MRI of the cervical spine without and with contrast for further evaluation. This was not reported on the previous study.   Electronically Signed   By: San Morelle M.D.   On: 06/16/2015 21:13   Dg Abd Acute W/chest  06/16/2015   CLINICAL DATA:  Chest pain and vomiting since yesterday.  EXAM: DG ABDOMEN ACUTE W/ 1V CHEST  COMPARISON:  October 08, 2014  FINDINGS: There is no  evidence of dilated bowel loops or free intraperitoneal air. Extensive bowel content is identified throughout colon. Prior cholecystectomy clips are noted. No radiopaque calculi or other significant radiographic abnormality is otherwise seen. Heart size and mediastinal contours are within normal limits. Both lungs are clear.  IMPRESSION: No bowel obstruction.  Constipation.  No acute cardiopulmonary disease.   Electronically Signed   By: Abelardo Diesel M.D.   On: 06/16/2015 17:49    Oren Binet, MD  [facility] Pager:[phone]  If 7PM-7AM, please contact night-coverage [email or web address] Password [email or web address] 06/17/2015, 2:08 PM

## 2015-06-17 NOTE — Progress Notes (Signed)
  Echocardiogram 2D Echocardiogram has been performed.  Victoria Bird 06/17/2015, 3:54 PM

## 2015-06-17 NOTE — Progress Notes (Signed)
OT Cancellation Note  Patient Details Name: Victoria Bird MRN: [id number] DOB: November 08, 1945   Cancelled Treatment:    Reason Eval/Treat Not Completed: Other (comment) Note plan for MRI cervical spine. Will await results and check on pt another time.  [place], [place] 06/17/2015, 11:23 AM

## 2015-06-18 ENCOUNTER — Observation Stay (HOSPITAL_BASED_OUTPATIENT_CLINIC_OR_DEPARTMENT_OTHER): Payer: Medicare Other

## 2015-06-18 ENCOUNTER — Telehealth: Payer: Self-pay | Admitting: *Deleted

## 2015-06-18 DIAGNOSIS — M7989 Other specified soft tissue disorders: Secondary | ICD-10-CM | POA: Diagnosis not present

## 2015-06-18 DIAGNOSIS — I10 Essential (primary) hypertension: Secondary | ICD-10-CM | POA: Diagnosis not present

## 2015-06-18 DIAGNOSIS — G43A0 Cyclical vomiting, not intractable: Secondary | ICD-10-CM

## 2015-06-18 DIAGNOSIS — R079 Chest pain, unspecified: Secondary | ICD-10-CM | POA: Diagnosis not present

## 2015-06-18 DIAGNOSIS — J410 Simple chronic bronchitis: Secondary | ICD-10-CM | POA: Diagnosis not present

## 2015-06-18 DIAGNOSIS — R42 Dizziness and giddiness: Secondary | ICD-10-CM | POA: Diagnosis not present

## 2015-06-18 LAB — GLUCOSE, CAPILLARY
Glucose-Capillary: 101 mg/dL — ABNORMAL HIGH (ref 65–99)
Glucose-Capillary: 203 mg/dL — ABNORMAL HIGH (ref 65–99)

## 2015-06-18 MED ORDER — PANTOPRAZOLE SODIUM 40 MG PO TBEC: 40.0000 mg | DELAYED_RELEASE_TABLET | Freq: Every day | ORAL | Status: DC

## 2015-06-18 MED ORDER — ASPIRIN 81 MG PO TABS: 81.0000 mg | ORAL_TABLET | Freq: Every day | ORAL | Status: DC

## 2015-06-18 MED ORDER — TIOTROPIUM BROMIDE MONOHYDRATE 18 MCG IN CAPS: 18.0000 ug | ORAL_CAPSULE | Freq: Every day | RESPIRATORY_TRACT | Status: DC

## 2015-06-18 MED ORDER — OXYCODONE HCL 5 MG PO TABS: 5.0000 mg | ORAL_TABLET | Freq: Four times a day (QID) | ORAL | Status: DC | PRN

## 2015-06-18 MED ORDER — MECLIZINE HCL 25 MG PO TABS: 25.0000 mg | ORAL_TABLET | Freq: Three times a day (TID) | ORAL | Status: DC | PRN

## 2015-06-18 NOTE — Progress Notes (Signed)
SATURATION QUALIFICATIONS: (This note is used to comply with regulatory documentation for home oxygen)  Patient Saturations on Room Air at Rest = 90%  Patient Saturations on Room Air while Ambulating = 86%  Patient Saturations on 3 Liters of oxygen while Ambulating = 91%  Please briefly explain why patient needs home oxygen: to maintain oxygen saturations above 88% during physical activity such as ambulation  [name], PT, DPT 06/18/2015 Pager: [phone]

## 2015-06-18 NOTE — Evaluation (Signed)
Occupational Therapy Evaluation Patient Details Name: Victoria Bird MRN: [id number] DOB: Jun 07, 1946 Today's Date: 06/18/2015    History of Present Illness This 69 year old female was admitted with dizziness and nausea vomiting.  She has a PMH significant for DM, HTN, COPD, anxiety and OSA.  Pt states that she has had spells of dizziness since January and she has been using a cane. She has experienced several falls.  She has difficulty describing dizziness; indicates spinning and some double vision.     Clinical Impression   Pt was admitted for the above.  She has had periods of dizziness since January and has had intermittent assistance from her daughter.  Pt currently needs min guard to min A overall.  Goals are for set up for adls and min guard for toilet transfer.    Follow Up Recommendations  Supervision/Assistance - 24 hour    Equipment Recommendations  None recommended by OT    Recommendations for Other Services       Precautions / Restrictions Precautions Precautions: Fall Precaution Comments: monitor saturations Restrictions Weight Bearing Restrictions: No      Mobility Bed Mobility Overal bed mobility: Needs Assistance Bed Mobility: Supine to Sit     Supine to sit: Supervision        Transfers Overall transfer level: Needs assistance Equipment used: Straight cane Transfers: Sit to/from Stand Sit to Stand: Min guard         General transfer comment: for safety    Balance Overall balance assessment: History of Falls;Needs assistance         Standing balance support: Single extremity supported;During functional activity Standing balance-Leahy Scale: Poor Standing balance comment: requires UE support and min assist with ambulation for LOB                            ADL Overall ADL's : Needs assistance/impaired                         Toilet Transfer: Minimal assistance;Ambulation (cane)             General ADL  Comments: Pt can complete UB adls with set up.  She needs min guard assist for LB adls for safety.  Youngest daughter stays with her but has medical issues herself.  When asked about DME, pt states she can't afford it nor 02.  02 sats dropped to 84% on RA; in the 90s on 2-3 liters 02. Present for vestibular eval:  No nystagmus noted but pt reported dizziness on several tests.  Will further assess VOR for dysfunction.  At first pt was focused and HT to R was [facility] then pt had difficulty maintaining focus     Vision Additional Comments: pt has a little ptosis on L eyelid.  cyst-like bubble in corner of eye   Perception     Praxis      Pertinent Vitals/Pain Pain Assessment: Faces Faces Pain Scale: Hurts even more Pain Location: neck and shoulders Pain Descriptors / Indicators: Aching Pain Intervention(s): Limited activity within patient's tolerance;Monitored during session;Repositioned     Hand Dominance     Extremity/Trunk Assessment Upper Extremity Assessment Upper Extremity Assessment: Overall WFL for tasks assessed      Cervical / Trunk Assessment Cervical / Trunk Assessment: Other exceptions Cervical / Trunk Exceptions: able to perform active neck ROM WNL however reports pain with these; rests in shoulder protraction with forward head and  keeps shoulders slightly elevated despite cues to correct   Communication Communication Communication: No difficulties   Cognition Arousal/Alertness: Awake/alert Behavior During Therapy: WFL for tasks assessed/performed Overall Cognitive Status: Impaired/Different from baseline Area of Impairment: Safety/judgement         Safety/Judgement: Decreased awareness of safety     General Comments: pt with h/o falls; got up on own per RN; difficulty with timeline of events--feels bad today   General Comments       Exercises       Shoulder Instructions      Home Living Family/patient expects to be discharged to:: Private  residence Living Arrangements: Children Available Help at Discharge: Family   Home Access: Stairs to enter     Home Layout: One level               Home Equipment: Kasandra Knudsen - single point   Additional Comments: youngest daughter has been staying with pt.  She cannot cook.  Pt has been unable to do what she wants to since January--intermittent dizziness      Prior Functioning/Environment Level of Independence: Needs assistance  Gait / Transfers Assistance Needed: mod I with cane, has had 3 falls ADL's / Homemaking Assistance Needed: supervision basic adls; cannot do IADLS   Comments: for basic ADLs    OT Diagnosis: Generalized weakness;Acute pain   OT Problem List: Decreased strength;Decreased activity tolerance;Impaired balance (sitting and/or standing);Decreased knowledge of use of DME or AE;Pain;Cardiopulmonary status limiting activity   OT Treatment/Interventions: Self-care/ADL training;DME and/or AE instruction;Balance training;Patient/family education    OT Goals(Current goals can be found in the care plan section) Acute Rehab OT Goals Patient Stated Goal: be able to do whatever I want OT Goal Formulation: With patient Time For Goal Achievement: 06/25/15 ADL Goals Pt Will Transfer to Toilet: with min guard assist;ambulating;regular height toilet Additional ADL Goal #1: pt will complete adl with set up only, sit to stand  OT Frequency: Min 2X/week   Barriers to D/C:            Co-evaluation PT/OT/SLP Co-Evaluation/Treatment: Yes   PT goals addressed during session: Mobility/safety with mobility;Balance OT goals addressed during session: ADL's and self-care      End of Session    Activity Tolerance: Patient limited by fatigue;No increased pain Patient left: in bed;with bed alarm set (EOB)   Time: 1002-1040 OT Time Calculation (min): 38 min Charges:  OT General Charges $OT Visit: 1 Procedure OT Evaluation $Initial OT Evaluation Tier I: 1  Procedure G-Codes: OT G-codes **NOT FOR INPATIENT CLASS** Functional Assessment Tool Used: clinical judgment Functional Limitation: Self care Self Care Current Status (W1027): At least 20 percent but less than 40 percent impaired, limited or restricted Self Care Goal Status (O5366): At least 1 percent but less than 20 percent impaired, limited or restricted  SPENCER,MARYELLEN 06/18/2015, 12:18 PM  Lesle Chris, OTR/L [phone] 06/18/2015

## 2015-06-18 NOTE — Telephone Encounter (Signed)
-----   Message from Javier Glazier, MD sent at 06/18/2015 11:20 AM EDT ----- Regarding: Needs Appointment Patient is getting discharged from [facility] today. They are prescribing her home oxygen. She was supposed to have an appointment with me today. Can you reschedule it for sometime in the next week & have her do an oxygen titration walk during her visit. Thanks.

## 2015-06-18 NOTE — Progress Notes (Signed)
Spoke with pt concerning home O2 pt refuse to go home with Home O2.  Pt states, "I have oxygen before and I sent it back.  Medicare will not pay for all of the bill and I kept getting a bill from  [place] before. Medicare will not pay for all of my medications. What do I do eat or buy medications. NO, I do not want to go home with oxygen." There are no programs to assist pt with Home O2 at this time. Pt's co pay is $37.50 a month. Pt has refused home O2.

## 2015-06-18 NOTE — Evaluation (Signed)
Physical Therapy Evaluation Patient Details Name: Victoria Bird MRN: [id number] DOB: [birth year] Today's Date: 06/18/2015   History of Present Illness  This 69 year old female was admitted with dizziness and nausea vomiting.  She has a PMH significant for DM, HTN, COPD, anxiety and OSA.  Pt states that she has had spells of dizziness since January and she has been using a cane. She has experienced several falls.  She has difficulty describing dizziness; indicates spinning and some double vision.    Clinical Impression  Pt admitted with above diagnosis. Pt currently with functional limitations due to the deficits listed below (see PT Problem List). Pt will benefit from skilled PT to increase their independence and safety with mobility to allow discharge to the venue listed below.    Vestibular Exam:  Normal oculomotor exam, unable to focus with head thrust and reports dizziness with all testing (head shaking, head thrust, modified dix hallpike) however no nystagmus observed with any testing.  Pt states dizziness worse with head movement and mobility and alleviated by rest, no movement.  Pt reports dizziness as vertigo/spinning and blurred vision which as been occuring intermittently for the past 3 months.  Pt on oxygen while in room so removed and checked saturations (see previous saturation qualification note) which pt's saturations dropped to 86% room air during gait.  Pt also reports smoking so educated if she does go home with oxygen, important not to smoke while on oxygen.  Pt reports financial concerns about home needs, stating she has had oxygen before but can't afford it, as well as glucose testing strips.  Recommended RW for safety at home due to fall hx and pt again voices financial concerns.     Follow Up Recommendations Home health PT;Supervision for mobility/OOB    Equipment Recommendations  Rolling walker with 5" wheels    Recommendations for Other Services       Precautions /  Restrictions Precautions Precautions: Fall Precaution Comments: monitor saturations Restrictions Weight Bearing Restrictions: No      Mobility  Bed Mobility Overal bed mobility: Needs Assistance Bed Mobility: Supine to Sit     Supine to sit: Supervision        Transfers Overall transfer level: Needs assistance Equipment used: Straight cane Transfers: Sit to/from Stand Sit to Stand: Min guard         General transfer comment: for safety  Ambulation/Gait Ambulation/Gait assistance: Min assist Ambulation Distance (Feet): 240 Feet Assistive device: Straight cane Gait Pattern/deviations: Step-through pattern;Drifts right/left     General Gait Details: occasional assist for LOB, very dizzy however pt unable to describe well, checked SPO2 at 120 feet and 86% on room air so applied O2 [place] and pt required 3L O2 to increase to 91% during gait (see saturation qualification note),  pt gait pattern appeared better with O2 [place] on way back to room however pt reports feeling the same dizziness (no change)  Stairs            Wheelchair Mobility    Modified Rankin (Stroke Patients Only)       Balance Overall balance assessment: History of Falls;Needs assistance         Standing balance support: Single extremity supported;During functional activity Standing balance-Leahy Scale: Poor Standing balance comment: requires UE support and min assist with ambulation for LOB                             Pertinent Vitals/Pain  Pain Assessment: Faces Faces Pain Scale: Hurts even more Pain Location: neck and shoulders Pain Descriptors / Indicators: Aching Pain Intervention(s): Limited activity within patient's tolerance;Monitored during session;Repositioned    Home Living Family/patient expects to be discharged to:: Private residence Living Arrangements: Children Available Help at Discharge: Family   Home Access: Stairs to enter     Home Layout: One level Home  Equipment: Kasandra Knudsen - single point Additional Comments: youngest daughter has been staying with pt.  She cannot cook.  Pt has been unable to do what she wants to since January--intermittent dizziness    Prior Function Level of Independence: Needs assistance   Gait / Transfers Assistance Needed: mod I with cane, has had 3 falls  ADL's / Homemaking Assistance Needed: supervision basic adls; cannot do IADLS  Comments: for basic ADLs     Hand Dominance        Extremity/Trunk Assessment   Upper Extremity Assessment: Overall WFL for tasks assessed           Lower Extremity Assessment: Overall WFL for tasks assessed      Cervical / Trunk Assessment: Other exceptions  Communication   Communication: No difficulties  Cognition Arousal/Alertness: Awake/alert Behavior During Therapy: WFL for tasks assessed/performed Overall Cognitive Status: Impaired/Different from baseline Area of Impairment: Safety/judgement         Safety/Judgement: Decreased awareness of safety     General Comments: pt with h/o falls; got up on own per RN; difficulty with timeline of events--feels bad today    General Comments      Exercises        Assessment/Plan    PT Assessment Patient needs continued PT services  PT Diagnosis Difficulty walking   PT Problem List Decreased strength;Decreased activity tolerance;Decreased balance;Decreased mobility;Cardiopulmonary status limiting activity;Decreased knowledge of use of DME  PT Treatment Interventions DME instruction;Gait training;Functional mobility training;Patient/family education;Therapeutic activities;Therapeutic exercise;Stair training;Balance training   PT Goals (Current goals can be found in the Care Plan section) Acute Rehab PT Goals PT Goal Formulation: With patient Time For Goal Achievement: 06/25/15 Potential to Achieve Goals: Good    Frequency Min 3X/week   Barriers to discharge        Co-evaluation     PT goals addressed  during session: Mobility/safety with mobility;Balance OT goals addressed during session: ADL's and self-care       End of Session Equipment Utilized During Treatment: Gait belt;Oxygen Activity Tolerance: Patient tolerated treatment well Patient left: in bed;with call bell/phone within reach Nurse Communication: Mobility status    Functional Assessment Tool Used: clinical judgement, saturation qualification Functional Limitation: Mobility: Walking and moving around Mobility: Walking and Moving Around Current Status (U7654): At least 20 percent but less than 40 percent impaired, limited or restricted Mobility: Walking and Moving Around Goal Status [phone]): At least 1 percent but less than 20 percent impaired, limited or restricted    Time: 1002-1042 PT Time Calculation (min) (ACUTE ONLY): 40 min   Charges:   PT Evaluation $Initial PT Evaluation Tier I: 1 Procedure PT Treatments $Gait Training: 8-22 mins   PT G Codes:   PT G-Codes **NOT FOR INPATIENT CLASS** Functional Assessment Tool Used: clinical judgement, saturation qualification Functional Limitation: Mobility: Walking and moving around Mobility: Walking and Moving Around Current Status (W6568): At least 20 percent but less than 40 percent impaired, limited or restricted Mobility: Walking and Moving Around Goal Status [phone]): At least 1 percent but less than 20 percent impaired, limited or restricted    Karrington Studnicka,[name] 06/18/2015,  12:14 PM [name], PT, DPT 06/18/2015 Pager: [phone]

## 2015-06-18 NOTE — Discharge Summary (Signed)
PATIENT DETAILS Name: Victoria Bird Age: 69 y.o. Sex: female Date of Birth: 07/10/1946 MRN: [id number]. Admitting Physician: [name], MD YQM:VHQIONGE, Junious Dresser, PA-C  Admit Date: 06/16/2015 Discharge date: 06/18/2015  Recommendations for Outpatient Follow-up:  1. Please ensure follow up with Cardiology (stress test) 2. Please ensure follow up with Neurology (?myoclonus) 3. Please repeat CBC/BMET at next visit   PRIMARY DISCHARGE DIAGNOSIS:  Principal Problem:   Vertigo Active Problems:   Hypertension   COPD (chronic obstructive pulmonary disease)   Thyroid nodule   Chest pain   Nausea and vomiting   Diabetes mellitus type 2, controlled      PAST MEDICAL HISTORY: Past Medical History  Diagnosis Date  . Diabetes mellitus   . Hypertension   . COPD (chronic obstructive pulmonary disease)   . Chronic pain   . Anxiety   . Tracheobronchitis 01/01/2012  . High cholesterol   . OSA (obstructive sleep apnea)   . H/O: pneumonia     DISCHARGE MEDICATIONS: Current Discharge Medication List    START taking these medications   Details  aspirin 81 MG tablet Take 1 tablet (81 mg total) by mouth daily.    meclizine (ANTIVERT) 25 MG tablet Take 1 tablet (25 mg total) by mouth 3 (three) times daily as needed. Qty: 30 tablet, Refills: 0    oxyCODONE (OXY IR/ROXICODONE) 5 MG immediate release tablet Take 1 tablet (5 mg total) by mouth every 6 (six) hours as needed for moderate pain. Qty: 20 tablet, Refills: 0    pantoprazole (PROTONIX) 40 MG tablet Take 1 tablet (40 mg total) by mouth daily. Switch for any other PPI at similar dose and frequency Qty: 30 tablet, Refills: 0      CONTINUE these medications which have CHANGED   Details  tiotropium (SPIRIVA) 18 MCG inhalation capsule Place 1 capsule (18 mcg total) into inhaler and inhale daily. Qty: 30 capsule, Refills: 0      CONTINUE these medications which have NOT CHANGED   Details  albuterol (PROVENTIL  HFA;VENTOLIN HFA) 108 (90 BASE) MCG/ACT inhaler Inhale 1-2 puffs into the lungs every 4 (four) hours as needed. For shortness of breath. Qty: 1 Inhaler, Refills: 3    albuterol (PROVENTIL) (5 MG/ML) 0.5% nebulizer solution Take 0.5 mLs (2.5 mg total) by nebulization every 4 (four) hours as needed for wheezing or shortness of breath. Qty: 20 mL, Refills: 12    ipratropium (ATROVENT) 0.02 % nebulizer solution Take 2.5 mLs (0.5 mg total) by nebulization every 4 (four) hours as needed for wheezing. Qty: 75 mL, Refills: 12    losartan-hydrochlorothiazide (HYZAAR) 100-12.5 MG per tablet Take 1 tablet by mouth daily.    metFORMIN (GLUCOPHAGE) 500 MG tablet Take 500 mg by mouth 2 (two) times daily with a meal.     naproxen sodium (ANAPROX) 220 MG tablet Take 220 mg by mouth 2 (two) times daily with a meal.    ranitidine (ZANTAC) 150 MG tablet Take 1 tablet (150 mg total) by mouth at bedtime. Qty: 30 tablet, Refills: 3        ALLERGIES:   Allergies  Allergen Reactions  . Aspartame And Phenylalanine Nausea And Vomiting    Patient says she is allergic to all artificial sweeteners  . Tramadol Shortness Of Breath and Nausea Only  . Codeine Itching  . Sulfa Antibiotics Other (See Comments)    Kidney problem   . Penicillins Rash    Has patient had a PCN reaction causing immediate rash, facial/tongue/throat  swelling, SOB or lightheadedness with hypotension: No Has patient had a PCN reaction causing severe rash involving mucus membranes or skin necrosis: No Has patient had a PCN reaction that required hospitalization No Has patient had a PCN reaction occurring within the last 10 years: No If all of the above answers are "NO", then may proceed with Cephalosporin use.  . Prednisone Rash    Patient stated she received Prednisone while she was in the hospital and experienced a rash and "extreme pain.'    BRIEF HPI:  See H&P, Labs, Consult and Test reports for all details in brief, patient was  admitted for evaluation of vertigo and chest pain.  CONSULTATIONS:   cardiology  PERTINENT RADIOLOGIC STUDIES: Mr Brain Wo Contrast  06/16/2015   CLINICAL DATA:  Intermittent vertigo, nausea, and vomiting. Near syncopal episodes with intra and headaches. Diplopia during the episodes of vertigo.  EXAM: MRI HEAD WITHOUT CONTRAST  TECHNIQUE: Multiplanar, multiecho pulse sequences of the brain and surrounding structures were obtained without intravenous contrast.  COMPARISON:  CT head without contrast 06/13/2014. Report of MRI cervical spine 08/10/1998  FINDINGS: Mild generalized atrophy is noted. Moderate periventricular and bilateral subcortical T2 hyperintensities are present bilaterally.  No acute infarct, hemorrhage, or mass lesion is present. The ventricles are of normal size.  Dilated perivascular spaces are present in the basal ganglia bilaterally. The brainstem is within normal limits. The internal auditory canals are normal bilaterally. Flow is present in the major intracranial arteries. The globes and orbits are intact. The paranasal sinuses and mastoid air cells are clear.  Skullbase is within normal limits.  A 3.3 mm syrinx is evident at the level of C3. This extends from C2 to at least C3-4. No discrete mass lesion is present in the upper cervical spine. Midline structures are otherwise within normal limits.  IMPRESSION: 1. No acute intracranial abnormality. No acute or focal lesion to explain the patient's vertigo. 2. Mild atrophy and moderate diffuse white matter disease. This is nonspecific, but likely reflects the sequela of chronic microvascular disease. 3. 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 Recommend MRI of the cervical spine without and with contrast for further evaluation. This was not reported on the previous study.   Electronically Signed   By: San Morelle M.D.   On: 06/16/2015 21:13   Mr Cervical Spine W Wo Contrast  06/17/2015   CLINICAL DATA:  Continued surveillance  suspected syrinx. Intermittent vertigo with headaches. Diplopia. Syncopal episodes.  EXAM: MRI CERVICAL SPINE WITHOUT AND WITH CONTRAST  TECHNIQUE: Multiplanar and multiecho pulse sequences of the cervical spine, to include the craniocervical junction and cervicothoracic junction, were obtained according to standard protocol without and with intravenous contrast.  CONTRAST:  56mL MULTIHANCE GADOBENATE DIMEGLUMINE 529 MG/ML IV SOLN  COMPARISON:  MR brain 06/16/2015.  FINDINGS: There is considerable motion degradation. Patient had a difficult time holding still. In addition she was breathing heavily causing additional motion artifact. The fastest sequences possible were used. Overall study is marginally diagnostic.  Normal cervical spine alignment. Normal intervertebral disc spaces. Normal cerebellar tonsils. No intraspinal mass lesion. No abnormal postcontrast enhancement. No definite neck masses. Flow voids are maintained in the BILATERAL vertebral arteries.  There is no definite hydromyelia or syrinx observed on today's study. On a single sagittal T2 image (image 8 series 3) there is a Gibbs artifact superimposed over the cord which appears fairly similar to yesterday's MR and may explain the appearance on 06/16/15. I do not see prominence of the central  canal or a syrinx cavity on the axial images although those series, too, are of fairly poor quality. In the lower cervical and upper thoracic region, where there is less artifact from breathing and motion, the cord is completely normal.  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  Overall the cord appears normal without visible hydromyelia/syrinx, intraspinal mass lesion or postcontrast enhancement.  No T2 hyperintensity of the cord to suggest a mass or inflammation.   Electronically Signed   By: Staci Righter M.D.   On: 06/17/2015 21:36   Dg Abd Acute W/chest  06/16/2015   CLINICAL  DATA:  Chest pain and vomiting since yesterday.  EXAM: DG ABDOMEN ACUTE W/ 1V CHEST  COMPARISON:  October 08, 2014  FINDINGS: There is no evidence of dilated bowel loops or free intraperitoneal air. Extensive bowel content is identified throughout colon. Prior cholecystectomy clips are noted. No radiopaque calculi or other significant radiographic abnormality is otherwise seen. Heart size and mediastinal contours are within normal limits. Both lungs are clear.  IMPRESSION: No bowel obstruction.  Constipation.  No acute cardiopulmonary disease.   Electronically Signed   By: Abelardo Diesel M.D.   On: 06/16/2015 17:49     PERTINENT LAB RESULTS: CBC:  Recent Labs  06/16/15 1548 06/17/15 0350  WBC 9.9 8.7  HGB 16.4* 15.0  HCT 47.8* 45.2  PLT 192 156   CMET CMP     Component Value Date/Time   NA 140 06/17/2015 0350   K 4.0 06/17/2015 0350   CL 110 06/17/2015 0350   CO2 25 06/17/2015 0350   GLUCOSE 98 06/17/2015 0350   BUN 10 06/17/2015 0350   CREATININE 0.88 06/17/2015 0350   CALCIUM 7.7* 06/17/2015 0350   PROT 5.9* 06/17/2015 0350   ALBUMIN 3.2* 06/17/2015 0350   AST 17 06/17/2015 0350   ALT 13* 06/17/2015 0350   ALKPHOS 53 06/17/2015 0350   BILITOT 0.7 06/17/2015 0350   GFRNONAA >60 06/17/2015 0350   GFRAA >60 06/17/2015 0350    GFR Estimated Creatinine Clearance: 62.5 mL/min (by C-G formula based on Cr of 0.88).  Recent Labs  06/16/15 1548  LIPASE 26    Recent Labs  06/16/15 2310 06/17/15 0350 06/17/15 0951  TROPONINI <0.03 <0.03 <0.03   Invalid input(s): POCBNP No results for input(s): DDIMER in the last 72 hours. No results for input(s): HGBA1C in the last 72 hours. No results for input(s): CHOL, HDL, LDLCALC, TRIG, CHOLHDL, LDLDIRECT in the last 72 hours. No results for input(s): TSH, T4TOTAL, T3FREE, THYROIDAB in the last 72 hours.  Invalid input(s): FREET3 No results for input(s): VITAMINB12, FOLATE, FERRITIN, TIBC, IRON, RETICCTPCT in the last 72  hours. Coags: No results for input(s): INR in the last 72 hours.  Invalid input(s): PT Microbiology: No results found for this or any previous visit (from the past 240 hour(s)).   BRIEF HOSPITAL COURSE:  Vertigo: Suspicion for peripheral vertigo. Admitted and provided supportive care, much improved with hardly any vertigo at time of discharge. PT recommending, HHPT. Continue prn Meclizine on discharge. MRI brain negative, but some suspicion for cervial syrinx-MRI C Spine done-negative for syrinx-likely was a artifact.  Active Problems: Nausea with vomiting: Secondary to above. Resolved with supportive care.   Chest pain: Resolved, cardiac enzymes and negative. Echocardiogram shows preserved EF without any wall motion abnormality. Plans are for outpatient new distress status per cardiology.   Hypertension: Moderate controlled-continue losartan. Follow and  optimize accordingly in the outpatient settng.  Type 2 diabetes: Continue metformin.Follow and optimize accordingly in the outpatient settng.  COPD (chronic obstructive pulmonary disease): Lungs clear-continue Spiriva and as needed bronchodilators.  Chronic Hypoxic Resp Failure: spoke with primary Pulmonary MD-Dr Molli Knock severe COPD(recent PFT done)-per patient-she was on O2 previously-arranged for home O2-She again refuses (cant afford copay!). Lower ext dopplers neg, no further investigations required as low suspicion for other causes.   Thyroid nodule: Completed FNAC as outpatient-patient she is scheduled for a thyroidectomy at some point.  GERD: PPI.  ?Myclonus jerks:outpatient referral to Neuro. Per patient this has been ongoing for "months"  TODAY-DAY OF DISCHARGE:  Subjective:   Evalin Shawhan today has no headache,no chest abdominal pain,no new weakness tingling or numbness, feels much better wants to go home today.   Objective:   Blood pressure 144/66, pulse 68, temperature 98 F (36.7 C), temperature source  Oral, resp. rate 18, height 5\' 5"  (1.651 m), weight 76.204 kg (168 lb), SpO2 95 %.  Intake/Output Summary (Last 24 hours) at 06/18/15 1509 Last data filed at 06/18/15 1230  Gross per 24 hour  Intake      0 ml  Output    300 ml  Net   -300 ml   Filed Weights   06/17/15 0007  Weight: 76.204 kg (168 lb)    Exam Awake Alert, Oriented *3, No new F.N deficits, Normal affect [place].AT,PERRAL Supple Neck,No JVD, No cervical lymphadenopathy appriciated.  Symmetrical Chest wall movement, Good air movement bilaterally, CTAB RRR,No Gallops,Rubs or new Murmurs, No Parasternal Heave +ve B.Sounds, Abd Soft, Non tender, No organomegaly appriciated, No rebound -guarding or rigidity. No Cyanosis, Clubbing or edema, No new Rash or bruise  DISCHARGE CONDITION: Stable  DISPOSITION: Home with home health services  DISCHARGE INSTRUCTIONS:    Activity:  As tolerated with Full fall precautions use walker/cane & assistance as needed  Get Medicines reviewed and adjusted: Please take all your medications with you for your next visit with your Primary MD  Please request your Primary MD to go over all hospital tests and procedure/radiological results at the follow up, please ask your Primary MD to get all Hospital records sent to his/her office.  If you experience worsening of your admission symptoms, develop shortness of breath, life threatening emergency, suicidal or homicidal thoughts you must seek medical attention immediately by calling 911 or calling your MD immediately  if symptoms less severe.  You must read complete instructions/literature along with all the possible adverse reactions/side effects for all the Medicines you take and that have been prescribed to you. Take any new Medicines after you have completely understood and accpet all the possible adverse reactions/side effects.   Do not drive when taking Pain medications.   Do not take more than prescribed Pain, Sleep and Anxiety  Medications  Special Instructions: If you have smoked or chewed Tobacco  in the last 2 yrs please stop smoking, stop any regular Alcohol  and or any Recreational drug use.  Wear Seat belts while driving.  Please note  You were cared for by a hospitalist during your hospital stay. Once you are discharged, your primary care physician will handle any further medical issues. Please note that NO REFILLS for any discharge medications will be authorized once you are discharged, as it is imperative that you return to your primary care physician (or establish a relationship with a primary care physician if you do not have one) for your aftercare needs so that they can  reassess your need for medications and monitor your lab values.   Diet recommendation: Diabetic Diet Heart Healthy diet  Discharge Instructions    Ambulatory referral to Neurology    Complete by:  As directed   An appointment is requested in approximately: 2-4 weeks     Call MD for:  persistant nausea and vomiting    Complete by:  As directed      Call MD for:  severe uncontrolled pain    Complete by:  As directed      Call MD for:    Complete by:  As directed   vertigo     Diet - low sodium heart healthy    Complete by:  As directed      Increase activity slowly    Complete by:  As directed            Follow-up Information    Follow up with [facility] On 06/23/2015.   Specialty:  Cardiology   Why:  12:00 PM for stress test   Contact information:   [address] [place] [phone]      Follow up with Berton Lan A, PA-C. Schedule an appointment as soon as possible for a visit in 2 weeks.   Specialty:  Physician Assistant   Contact information:   [address] [place] [id number] [phone]       Follow up with [facility].   Why:  OFFICE WILL CALL FOR APPOINTMENT-IF YOU DO NOT HEAR FROM THEM-PLEASE GIVE THEM A CALL IN A FEW  DAYS   Contact information:   [address] [place] [phone]      Total Time spent on discharge equals 25 minutes.  SignedOren Binet 06/18/2015 3:09 PM

## 2015-06-18 NOTE — Telephone Encounter (Signed)
Patient is scheduled for Wednesday

## 2015-06-18 NOTE — Progress Notes (Signed)
Spoke with pt again concerning [facility] and Home O2.  Pt is still smoking and refuse HH O2 and refused HHPT at this time.

## 2015-06-18 NOTE — Telephone Encounter (Signed)
Called pt and appt is scheduled for Tuesday 9/20 at 1:30. Nothing further needed

## 2015-06-18 NOTE — Telephone Encounter (Signed)
Per JN okay to overbook at 1:30 next week. I will do walk on pt (not 73mw).  Called pt and LMTCB x1

## 2015-06-18 NOTE — Progress Notes (Signed)
*  PRELIMINARY RESULTS* Vascular Ultrasound Lower extremity venous duplex has been completed.  Preliminary findings: No evidence of DVT or baker's cyst.  Landry Mellow, RDMS, RVT  06/18/2015, 1:50 PM

## 2015-06-21 ENCOUNTER — Telehealth (HOSPITAL_COMMUNITY): Payer: Self-pay

## 2015-06-21 NOTE — Telephone Encounter (Signed)
Patient given detailed instructions per Myocardial Perfusion Study Information Sheet for test on 06/23/15 at 12:00. Patient notified to arrive 15 minutes early and that it is imperative to arrive on time for appointment to keep from having the test rescheduled.  If you need to cancel or reschedule your appointment, please call the office within 24 hours of your appointment. Failure to do so may result in a cancellation of your appointment, and a $50 no show fee. Patient verbalized understanding. Saddie Benders

## 2015-06-22 ENCOUNTER — Encounter: Payer: Self-pay | Admitting: Pulmonary Disease

## 2015-06-22 ENCOUNTER — Ambulatory Visit (INDEPENDENT_AMBULATORY_CARE_PROVIDER_SITE_OTHER): Payer: Medicare Other | Admitting: Pulmonary Disease

## 2015-06-22 VITALS — BP 122/66 | HR 84 | Ht 63.0 in | Wt 166.8 lb

## 2015-06-22 DIAGNOSIS — J449 Chronic obstructive pulmonary disease, unspecified: Secondary | ICD-10-CM | POA: Diagnosis not present

## 2015-06-22 DIAGNOSIS — G4733 Obstructive sleep apnea (adult) (pediatric): Secondary | ICD-10-CM | POA: Diagnosis not present

## 2015-06-22 DIAGNOSIS — F17200 Nicotine dependence, unspecified, uncomplicated: Secondary | ICD-10-CM | POA: Insufficient documentation

## 2015-06-22 DIAGNOSIS — Z720 Tobacco use: Secondary | ICD-10-CM | POA: Diagnosis not present

## 2015-06-22 DIAGNOSIS — K219 Gastro-esophageal reflux disease without esophagitis: Secondary | ICD-10-CM | POA: Diagnosis not present

## 2015-06-22 NOTE — Progress Notes (Signed)
Subjective:    Patient ID: Victoria Bird, female    DOB: 01-31-1946, 69 y.o.   MRN: [id number]  C.C.:  Follow-up after recent hospitalization for nausea,emesis, & vertigo with Severe COPD, Chronic Hypoxic Respiratory Failure, OSA, GERD, & Ongoing Tobacco Use.  HPI Severe COPD: Counseled her last appointment to continue using Spiriva. She reports her dyspnea is at baseline. She has intermittent cough productive of a minimal amount of clear phlegm. No hemoptysis. Occasional wheezing mostly with exertion. She reports she is compliant with Spiriva. She uses her rescue inhaler as much as 2-3 times daily. Patient has previously tried Advair which seemed to worsen her breathing and cause chest tightness.  Chronic Hypoxic Respiratory Failure: Patient had hypoxia to 86% while ambulating on room air during recent hospitalization. Required 3 L/m with ambulation to maintain her saturation. Room air at rest 90%. Walk test today showed no oxygen requirement with ambulating 3 laps.  OSA: Last polysomnogram was 2010.  She admits she has troubles falling asleep. On average she is sleeping 4-5 hours a night but only slept for 3 hours last night.  GERD: Started on Zantac 150 mg by mouth daily at bedtime last appointment. She denies any reflux, dyspepsia, or morning brash water taste. She reports she does have some "groggy" sensation with taking Zantac at night.   Ongoing Tobacco Use: Previously patient reports she has been unable to quit due to psychosocial stressors. She continues to smoke anywhere from a "few cigarettes" to 1/2ppd depending on her level of stress.  Review of Systems Continuing to have nausea and difficulty keep "food down". She reports intermittent muscle "spasms". No fever or chills. She continues to have intermittent sweats. Continues to have chest discomfort & has followup with cardiology tomorrow for a stress test. She has had near syncope intermittently with her nausea.  Allergies    Allergen Reactions  . Aspartame And Phenylalanine Nausea And Vomiting    Patient says she is allergic to all artificial sweeteners  . Tramadol Shortness Of Breath and Nausea Only  . Codeine Itching  . Sulfa Antibiotics Other (See Comments)    Kidney problem   . Penicillins Rash    Has patient had a PCN reaction causing immediate rash, facial/tongue/throat swelling, SOB or lightheadedness with hypotension: No Has patient had a PCN reaction causing severe rash involving mucus membranes or skin necrosis: No Has patient had a PCN reaction that required hospitalization No Has patient had a PCN reaction occurring within the last 10 years: No If all of the above answers are "NO", then may proceed with Cephalosporin use.  . Prednisone Rash    Patient stated she received Prednisone while she was in the hospital and experienced a rash and "extreme pain.'   Current Outpatient Prescriptions on File Prior to Visit  Medication Sig Dispense Refill  . albuterol (PROVENTIL HFA;VENTOLIN HFA) 108 (90 BASE) MCG/ACT inhaler Inhale 1-2 puffs into the lungs every 4 (four) hours as needed. For shortness of breath. 1 Inhaler 3  . albuterol (PROVENTIL) (5 MG/ML) 0.5% nebulizer solution Take 0.5 mLs (2.5 mg total) by nebulization every 4 (four) hours as needed for wheezing or shortness of breath. 20 mL 12  . ipratropium (ATROVENT) 0.02 % nebulizer solution Take 2.5 mLs (0.5 mg total) by nebulization every 4 (four) hours as needed for wheezing. 75 mL 12  . losartan-hydrochlorothiazide (HYZAAR) 100-12.5 MG per tablet Take 1 tablet by mouth daily.    . metFORMIN (GLUCOPHAGE) 500 MG tablet Take 500 mg  by mouth 2 (two) times daily with a meal.     . oxyCODONE (OXY IR/ROXICODONE) 5 MG immediate release tablet Take 1 tablet (5 mg total) by mouth every 6 (six) hours as needed for moderate pain. 20 tablet 0  . ranitidine (ZANTAC) 150 MG tablet Take 1 tablet (150 mg total) by mouth at bedtime. 30 tablet 3  . tiotropium  (SPIRIVA) 18 MCG inhalation capsule Place 1 capsule (18 mcg total) into inhaler and inhale daily. 30 capsule 0  . aspirin 81 MG tablet Take 1 tablet (81 mg total) by mouth daily. (Patient not taking: Reported on 06/22/2015)    . meclizine (ANTIVERT) 25 MG tablet Take 1 tablet (25 mg total) by mouth 3 (three) times daily as needed. (Patient not taking: Reported on 06/22/2015) 30 tablet 0  . pantoprazole (PROTONIX) 40 MG tablet Take 1 tablet (40 mg total) by mouth daily. Switch for any other PPI at similar dose and frequency (Patient not taking: Reported on 06/22/2015) 30 tablet 0   No current facility-administered medications on file prior to visit.   Past Medical History  Diagnosis Date  . Diabetes mellitus   . Hypertension   . COPD (chronic obstructive pulmonary disease)   . Chronic pain   . Anxiety   . Tracheobronchitis 01/01/2012  . High cholesterol   . OSA (obstructive sleep apnea)   . H/O: pneumonia    Past Surgical History  Procedure Laterality Date  . Appendectomy    . Cholecystectomy    . Abdominal hysterectomy    . Fracture surgery    . Rotator cuff repair    . Carpal tunnel release Left    Family History  Problem Relation Age of Onset  . Heart attack Mother   . Diabetes Maternal Grandfather   . Thyroid disease Neg Hx   . Lung disease Neg Hx   . Heart attack Maternal Grandfather   . Heart attack Maternal Grandmother   . Hypertension Maternal Grandmother   . Hypertension Maternal Grandfather   . Stroke Maternal Grandfather    Social History   Social History  . Marital Status: Divorced    Spouse Name: N/A  . Number of Children: Y  . Years of Education: N/A   Occupational History  . unemployed    Social History Main Topics  . Smoking status: Current Every Day Smoker -- 0.50 packs/day for 41 years    Types: Cigarettes    Start date: 03/19/1974  . Smokeless tobacco: Never Used     Comment: Peak rate of 1.5ppd  . Alcohol Use: No  . Drug Use: No  . Sexual  Activity: No   Other Topics Concern  . None   Social History Narrative   Daughter Edmonia Lynch) [phone]   Originally from [place]. Previously lived in [place]. She has traveled from [facility] to [place] & has also traveled across country to [place]. Multiple visits to [place], [place], [place], [place], [place]. She has a dog at home. No bird, mold, or hot tub exposure. She reports some years ago she did live in a home that had black mold in her closet. 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             Objective:   Physical Exam Blood pressure 122/66, pulse 84, height 5\' 3"  (1.6 m), weight 166 lb 12.8 oz (75.66 kg), SpO2 93 %.  General:  Awake. Alert. Mild central obesity. Integument:  Warm & dry. No rash on exposed skin. HEENT:  Moist mucus membranes. No oral ulcers. No nasal turbinate swelling. Cardiovascular:  Regular rate & rhythm. No edema.   Pulmonary:  Good aeration & clear to auscultation bilaterally. Normal work of breathing on room air. Abdomen: Soft. Normal bowel sounds. Mildly protuberant.  Musculoskeletal:  Normal bulk and tone. No joint deformity or effusion appreciated.  PFT 06/09/15: FVC 2.21 L (73%) FEV1 1.08 L (47%) FEV1/FVC 0.49 FEF 25-75 0.43 L (22%) no bronchodilator response TLC 5.26 L (105%) RV 135% ERV 87% DLCO corrected 36% (Hgb 16.2)  6MWT 06/22/15:  3 laps around office. Baseline 93% on RA. Nadir Sat 89% on RA. 06/18/15:  Walked in hospital by PT / Baseline Sat 90% on RA / Nadir Sat 86% on RA (required 3 L/m on exertion to maintain)  IMAGING CXR PA/LAT 10/08/14 (previously reviewed by me): Mild persistent linear opacity consistent with scar at the right lung base. Kyphosis noted. Hyperinflation with some flattening of the diaphragm. No pleural effusion or thickening appreciated. No parenchymal opacity or nodule appreciated. Heart normal in size.  Mediastinum: Contour.  CT CHEST W/ 04/16/02 (per radiologist): No evidence for pulmonary emboli. No pathologically enlarged mediastinal or hilar lymph nodes. Enlarged lower pole of left thyroid lobe with a nodular configuration. Mild water density alveolitis with an anterior portion of left upper lobe & medial anterior portion of right upper lobe. No parenchymal nodules.   CARDIAC TTE (01/08/13): LV normal in size. EF 60-65%. Normal wall motion without regional abnormality. LA & RA normal in size. RV normal in size and function. Pulmonary artery systolic pressure 43 mmHg. Trivial aortic regurgitation. No mitral stenosis or regurgitation. No pulmonic or tricuspid regurgitation. No pericardial effusion.  LABS 05/27/15 Alpha-1 antitrypsin: 164  04/29/15 BMP: 140/4.2/102/24/16/1.01/93/9 LFT: 4/6.7/0.7/67/14/13  10/09/14 VBG (on room air): 7.39/37/59  03/24/13 ANA: Negative  RF:  <7.0    Assessment & Plan:  69 year old female with underlying severe COPD. She had a recent hospitalization with nausea and vomiting. In reviewing her pulmonary function testing she does have some air trapping on her lung volumes consistent with her underlying COPD. Despite her substantially decreased carbon dioxide diffusion capacity she has no evidence of oxygen requirement on our brief walking test during her office visit today. We discussed her ongoing tobacco use at length and I did counsel her on the need for complete tobacco cessation. She is extremely concerned about her thyroid surgery and its referral pending further testing. I attempted to reassure her that further cardiac evaluation is reasonable given her constellation of symptoms. She is scheduled for a stress test tomorrow. Symptomatically she seems to be reasonably well-controlled with Spiriva and is having trouble affording inhaler medications. I instructed the patient contact my office if she needed samples as we would try to supply her with medication as it is  available. I instructed the patient to contact my office if she had any new breathing problems before her next appointment.  1. Severe COPD: Continuing patient on Spiriva. Checking alpha-1 antitrypsin phenotype today. 6 minute walk test at next appointment. 2. OSA: Unclear severity. Plan for future polysomnogram testing but the patient seems to have more problems with insomnia and sleep apnea. 3. GERD: Continuing Zantac by mouth daily at bedtime. 4. Ongoing tobacco use: Spelled over 3 minutes counseling the patient on the need for complete tobacco cessation. 5. Hypoxic respiratory failure: Patient has no evidence of oxygen requirement on  ambulation in office today. Repeat 6 minute walk test at next appointment. 6. Planned thyroid surgery: Patient is at moderate to high risk for perioperative pulmonary complications given her severe COPD & underlying OSA. This should not preclude her ability to undergo the surgery however. I would recommend cautious use of narcotics and sedatives around the time of extubation to prevent CO2 retention and the use of nebulized medications as needed for wheezing. 7. Follow-up: Patient to return to clinic in 3 months or sooner if needed.

## 2015-06-22 NOTE — Patient Instructions (Signed)
1. Continue taking her inhalers as prescribed. 2. Continue taking Zantac as prescribed. 3. Please try to work on cutting back & cutting out your cigarette use as much as possible. 4. I will see you back in 3 months or sooner if needed. Please contact my office if you have any questions or new breathing problems before your next appointment.

## 2015-06-23 ENCOUNTER — Ambulatory Visit (HOSPITAL_COMMUNITY): Payer: Medicare Other | Attending: Internal Medicine

## 2015-06-23 DIAGNOSIS — J449 Chronic obstructive pulmonary disease, unspecified: Secondary | ICD-10-CM

## 2015-06-23 DIAGNOSIS — R079 Chest pain, unspecified: Secondary | ICD-10-CM | POA: Diagnosis not present

## 2015-06-23 DIAGNOSIS — I10 Essential (primary) hypertension: Secondary | ICD-10-CM | POA: Insufficient documentation

## 2015-06-23 LAB — MYOCARDIAL PERFUSION IMAGING
CHL CUP NUCLEAR SRS: 1
CHL CUP NUCLEAR SSS: 2
CHL CUP STRESS STAGE 1 DBP: 85 mmHg
CHL CUP STRESS STAGE 1 GRADE: 0 %
CHL CUP STRESS STAGE 1 SBP: 131 mmHg
CHL CUP STRESS STAGE 1 SPEED: 0 mph
CHL CUP STRESS STAGE 2 GRADE: 0 %
CHL CUP STRESS STAGE 2 HR: 64 {beats}/min
CHL CUP STRESS STAGE 4 GRADE: 0 %
CHL CUP STRESS STAGE 4 HR: 103 {beats}/min
CHL CUP STRESS STAGE 4 SPEED: 0 mph
CHL CUP STRESS STAGE 5 DBP: 65 mmHg
CHL CUP STRESS STAGE 5 GRADE: 0 %
CHL CUP STRESS STAGE 5 HR: 103 {beats}/min
CHL CUP STRESS STAGE 5 SBP: 138 mmHg
CHL CUP STRESS STAGE 5 SPEED: 0 mph
CHL CUP STRESS STAGE 6 GRADE: 0 %
CSEPPBP: 145 mmHg
CSEPPMHR: 67 %
Estimated workload: 1 METS
LHR: 0.24
LV dias vol: 68 mL
LVSYSVOL: 20 mL
NUC STRESS TID: 0.88
Peak HR: 103 {beats}/min
Rest HR: 65 {beats}/min
SDS: 2
Stage 1 HR: 64 {beats}/min
Stage 2 Speed: 0 mph
Stage 3 Grade: 0 %
Stage 3 HR: 86 {beats}/min
Stage 3 Speed: 0 mph
Stage 4 DBP: 63 mmHg
Stage 4 SBP: 145 mmHg
Stage 6 HR: 92 {beats}/min
Stage 6 Speed: 0 mph

## 2015-06-23 MED ORDER — TECHNETIUM TC 99M SESTAMIBI GENERIC - CARDIOLITE
32.9000 | Freq: Once | INTRAVENOUS | Status: AC | PRN
  Administered 2015-06-23: 32.9 via INTRAVENOUS

## 2015-06-23 MED ORDER — TECHNETIUM TC 99M SESTAMIBI GENERIC - CARDIOLITE
11.0000 | Freq: Once | INTRAVENOUS | Status: AC | PRN
  Administered 2015-06-23: 11 via INTRAVENOUS

## 2015-06-23 MED ORDER — REGADENOSON 0.4 MG/5ML IV SOLN
0.4000 mg | Freq: Once | INTRAVENOUS | Status: AC
  Administered 2015-06-23: 0.4 mg via INTRAVENOUS

## 2015-07-19 ENCOUNTER — Other Ambulatory Visit: Payer: Medicare Other

## 2015-07-22 ENCOUNTER — Ambulatory Visit: Payer: Medicare Other | Admitting: Endocrinology

## 2015-07-23 ENCOUNTER — Other Ambulatory Visit: Payer: Self-pay | Admitting: *Deleted

## 2015-07-23 ENCOUNTER — Other Ambulatory Visit (INDEPENDENT_AMBULATORY_CARE_PROVIDER_SITE_OTHER): Payer: Medicare Other

## 2015-07-23 DIAGNOSIS — E0800 Diabetes mellitus due to underlying condition with hyperosmolarity without nonketotic hyperglycemic-hyperosmolar coma (NKHHC): Secondary | ICD-10-CM | POA: Diagnosis not present

## 2015-07-23 DIAGNOSIS — I10 Essential (primary) hypertension: Secondary | ICD-10-CM

## 2015-07-23 DIAGNOSIS — E7800 Pure hypercholesterolemia, unspecified: Secondary | ICD-10-CM | POA: Diagnosis not present

## 2015-07-23 LAB — MICROALBUMIN / CREATININE URINE RATIO
CREATININE, U: 78.4 mg/dL
Microalb Creat Ratio: 0.9 mg/g (ref 0.0–30.0)
Microalb, Ur: <0.7 mg/dL (ref 0.0–1.9)

## 2015-07-23 LAB — COMPREHENSIVE METABOLIC PANEL
ALBUMIN: 3.7 g/dL (ref 3.5–5.2)
ALK PHOS: 71 U/L (ref 39–117)
ALT: 10 U/L (ref 0–35)
AST: 12 U/L (ref 0–37)
BUN: 15 mg/dL (ref 6–23)
CALCIUM: 9.1 mg/dL (ref 8.4–10.5)
CHLORIDE: 105 meq/L (ref 96–112)
CO2: 29 mEq/L (ref 19–32)
Creatinine, Ser: 1.03 mg/dL (ref 0.40–1.20)
GFR: 56.58 mL/min — AB (ref >60.00)
Glucose, Bld: 108 mg/dL — ABNORMAL HIGH (ref 70–99)
POTASSIUM: 4 meq/L (ref 3.5–5.1)
SODIUM: 140 meq/L (ref 135–145)
TOTAL PROTEIN: 6.7 g/dL (ref 6.0–8.3)
Total Bilirubin: 0.7 mg/dL (ref 0.2–1.2)

## 2015-07-23 LAB — LDL CHOLESTEROL, DIRECT: Direct LDL: 118 mg/dL

## 2015-07-23 LAB — LIPID PANEL
CHOL/HDL RATIO: 7
CHOLESTEROL: 201 mg/dL — AB (ref 0–200)
HDL: 30 mg/dL — AB (ref >39.00)
NonHDL: 170.82
TRIGLYCERIDES: 243 mg/dL — AB (ref 0.0–149.0)
VLDL: 48.6 mg/dL — AB (ref 0.0–40.0)

## 2015-07-23 LAB — HEMOGLOBIN A1C: Hgb A1c MFr Bld: 5.7 % (ref 4.6–6.5)

## 2015-07-28 ENCOUNTER — Encounter: Payer: Self-pay | Admitting: Endocrinology

## 2015-07-28 ENCOUNTER — Ambulatory Visit (INDEPENDENT_AMBULATORY_CARE_PROVIDER_SITE_OTHER): Payer: Medicare Other | Admitting: Endocrinology

## 2015-07-28 VITALS — BP 116/68 | HR 91 | Temp 98.0°F | Resp 14 | Ht 63.0 in | Wt 166.4 lb

## 2015-07-28 DIAGNOSIS — E119 Type 2 diabetes mellitus without complications: Secondary | ICD-10-CM

## 2015-07-28 DIAGNOSIS — E041 Nontoxic single thyroid nodule: Secondary | ICD-10-CM | POA: Diagnosis not present

## 2015-07-28 DIAGNOSIS — Z23 Encounter for immunization: Secondary | ICD-10-CM | POA: Diagnosis not present

## 2015-07-28 NOTE — Progress Notes (Signed)
Patient ID: Victoria Bird, female   DOB: [birth year], 69 y.o.   MRN: [id number]    Reason for Appointment: Follow-up of thyroid nodule    History of Present Illness:   The patient's thyroid enlargement was first discovered in 06/2014 when she was having a CT scan of her neck. TSH level on 12/21/14 was 3.25  She  had a thyroid ultrasound showing the following:  Dominant nodule is inferior measuring 3.4 cm x 3.4 cm x 4.8 cm.  Aspiration of the nodule showed atypia of undetermined significance/follicular lesion, BETHESDA category 3 but also had abundant colloid  Although she was referred for surgery this has not been scheduled because of needing clearance from pulmonologist and cardiologist and intercurrent problems with vertigo. She is also quite reluctant to consider surgery  DIABETES:   She has a history of mild Diabetes which has been well controlled   Currently she is on  Metformin 500 twice a day  She checks her blood sugars periodically but mostly in the afternoon and usually has near-normal readings, by recall as follows:   PRE-MEAL Fasting Lunch Dinner Bedtime Overall  Glucose range:   90-125 <150   Mean/median:           Lab Results  Component Value Date   HGBA1C 5.7 07/23/2015   HGBA1C 5.7* 10/09/2014   HGBA1C 6.2* 12/27/2011   Lab Results  Component Value Date   MICROALBUR <0.7 07/23/2015   CREATININE 1.03 07/23/2015        Medication List       This list is accurate as of: 07/28/15  3:37 PM.  Always use your most recent med list.               albuterol (5 MG/ML) 0.5% nebulizer solution  Commonly known as:  PROVENTIL  Take 0.5 mLs (2.5 mg total) by nebulization every 4 (four) hours as needed for wheezing or shortness of breath.     albuterol 108 (90 BASE) MCG/ACT inhaler  Commonly known as:  PROVENTIL HFA;VENTOLIN HFA  Inhale 1-2 puffs into the lungs every 4 (four) hours as needed. For shortness of breath.     aspirin 81 MG  tablet  Take 1 tablet (81 mg total) by mouth daily.     ipratropium 0.02 % nebulizer solution  Commonly known as:  ATROVENT  Take 2.5 mLs (0.5 mg total) by nebulization every 4 (four) hours as needed for wheezing.     losartan-hydrochlorothiazide 100-12.5 MG tablet  Commonly known as:  HYZAAR  Take 1 tablet by mouth daily.     meclizine 25 MG tablet  Commonly known as:  ANTIVERT  Take 1 tablet (25 mg total) by mouth 3 (three) times daily as needed.     metFORMIN 500 MG tablet  Commonly known as:  GLUCOPHAGE  Take 500 mg by mouth 2 (two) times daily with a meal.     oxyCODONE 5 MG immediate release tablet  Commonly known as:  Oxy IR/ROXICODONE  Take 1 tablet (5 mg total) by mouth every 6 (six) hours as needed for moderate pain.     pantoprazole 40 MG tablet  Commonly known as:  PROTONIX  Take 1 tablet (40 mg total) by mouth daily. Switch for any other PPI at similar dose and frequency     ranitidine 150 MG tablet  Commonly known as:  ZANTAC  Take 1 tablet (150 mg total) by mouth at bedtime.     tiotropium 18 MCG inhalation capsule  Commonly known as:  SPIRIVA  Place 1 capsule (18 mcg total) into inhaler and inhale daily.        Allergies:  Allergies  Allergen Reactions  . Aspartame And Phenylalanine Nausea And Vomiting    Patient says she is allergic to all artificial sweeteners  . Tramadol Shortness Of Breath and Nausea Only  . Codeine Itching  . Sulfa Antibiotics Other (See Comments)    Kidney problem   . Penicillins Rash    Has patient had a PCN reaction causing immediate rash, facial/tongue/throat swelling, SOB or lightheadedness with hypotension: No Has patient had a PCN reaction causing severe rash involving mucus membranes or skin necrosis: No Has patient had a PCN reaction that required hospitalization No Has patient had a PCN reaction occurring within the last 10 years: No If all of the above answers are "NO", then may proceed with Cephalosporin use.  .  Prednisone Rash    Patient stated she received Prednisone while she was in the hospital and experienced a rash and "extreme pain.'    Past Medical History  Diagnosis Date  . Diabetes mellitus   . Hypertension   . COPD (chronic obstructive pulmonary disease) ([facility])   . Chronic pain   . Anxiety   . Tracheobronchitis 01/01/2012  . High cholesterol   . OSA (obstructive sleep apnea)   . H/O: pneumonia     Past Surgical History  Procedure Laterality Date  . Appendectomy    . Cholecystectomy    . Abdominal hysterectomy    . Fracture surgery    . Rotator cuff repair    . Carpal tunnel release Left     Family History  Problem Relation Age of Onset  . Heart attack Mother   . Diabetes Maternal Grandfather   . Thyroid disease Neg Hx   . Lung disease Neg Hx   . Heart attack Maternal Grandfather   . Heart attack Maternal Grandmother   . Hypertension Maternal Grandmother   . Hypertension Maternal Grandfather   . Stroke Maternal Grandfather     Social History:  reports that she has been smoking Cigarettes.  She started smoking about 41 years ago. She has a 20.5 pack-year smoking history. She has never used smokeless tobacco. She reports that she does not drink alcohol or use illicit drugs.   Review of Systems:  Review of Systems   She has episodes of swimmy headedness including in bed, waiting for neurology consultation            Examination:   BP 116/68 mmHg  Pulse 91  Temp(Src) 98 F (36.7 C)  Resp 14  Ht 5\' 3"  (1.6 m)  Wt 166 lb 6.4 oz (75.479 kg)  BMI 29.48 kg/m2  SpO2 92%            Neck: The thyroid is  Difficult to palpate and no specific thyroid enlargement felt today  Assessment/Plan:   Left-sided 3.4 cm nodule which showed atypia on biopsy However it appears to have colloid also She is still awaiting surgery She appears to have multiple medical problems and recently is having episodes of vertigo  Discussed that it may be reasonable to rebiopsy the  nodule and do cytogenetic analysis with Affirma testing before considering surgery She is agreeable to doing so  High-dose influenza vaccine given  [facility] 07/28/2015

## 2015-07-29 ENCOUNTER — Ambulatory Visit (INDEPENDENT_AMBULATORY_CARE_PROVIDER_SITE_OTHER): Payer: Medicare Other | Admitting: Neurology

## 2015-07-29 ENCOUNTER — Encounter: Payer: Self-pay | Admitting: Neurology

## 2015-07-29 VITALS — BP 168/70 | HR 72 | Ht 63.0 in | Wt 169.0 lb

## 2015-07-29 DIAGNOSIS — G44229 Chronic tension-type headache, not intractable: Secondary | ICD-10-CM | POA: Diagnosis not present

## 2015-07-29 DIAGNOSIS — R42 Dizziness and giddiness: Secondary | ICD-10-CM | POA: Diagnosis not present

## 2015-07-29 NOTE — Progress Notes (Signed)
NEUROLOGY CONSULTATION NOTE  Victoria Bird MRN: [id number] DOB: 09-23-1946  Referring provider: Oren Binet, MD (hospital referral) Primary care provider: Berton Lan, PA-C  Reason for consult:  Vertigo, tremor  HISTORY OF PRESENT ILLNESS: Victoria Bird is a 69 year old left-handed female with hypertension, COPD, tobacco abuse, type 2 diabetes and OSA who presents for myoclonus.  History obtained by patient and hospital notes.  Images of brain and cervical MRI and labs reviewed.  For the past year, she has had multiple somatic complaints, such as headache, dizzy spells, chest pain, and fatigue.  She has chronic pain but this has gotten worse over the past year as well.  Dizzy spells are described as sensation of movement.  She sees double vision of objects that moves around in her visual field.  It is associated with nausea and vomiting.  It occurs spontaneously and lasts anywhere from 10 to 60 minutes.  Movement makes it worse.    She also reports constant headache that starts in back of neck and up the head on either side.  It is usually left sided.  There is no associated symptoms such as nausea, photophobia or visual disturbance.  She has previously used Advil daily for many years but stopped 6 months ago due to stomach upset.  She tried hydrocodone as well.  She says she does not take any pain relievers currently.  She reports no prior history of headaches.  Sometimes, she reports that her body will suddenly start jerking.  She has a mild tremor in the hands, worse on the left, that comes and goes.  She has had a cardiac workup for chest pain.  Lexiscan Myocardial Perfusion was negative.  She has longstanding history of chronic pain all over her body since an accident in the 77s.  She was admitted to [facility] on 06/16/15 for vertigo with nausea and vomiting and chest pain.  Cardiac enzymes were negative.  MRI of the brain was unremarkable.  There was questionable  syrinx but was found to be artifact as MRI of cervical spine was negative.  During her hospitalization, it was noted that she had "jerking" of her body.  Labs showed unremarkable CBC, CMP, lactic acid, and negative urine drug screen.  She is followed for a thyroid nodule.  Aspiration showed atypia.  Recent TSH is still pending.  PAST MEDICAL HISTORY: Past Medical History  Diagnosis Date  . Diabetes mellitus   . Hypertension   . COPD (chronic obstructive pulmonary disease) ([place])   . Chronic pain   . Anxiety   . Tracheobronchitis 01/01/2012  . High cholesterol   . OSA (obstructive sleep apnea)   . H/O: pneumonia     PAST SURGICAL HISTORY: Past Surgical History  Procedure Laterality Date  . Appendectomy    . Cholecystectomy    . Abdominal hysterectomy    . Fracture surgery    . Rotator cuff repair    . Carpal tunnel release Left     MEDICATIONS: Current Outpatient Prescriptions on File Prior to Visit  Medication Sig Dispense Refill  . albuterol (PROVENTIL HFA;VENTOLIN HFA) 108 (90 BASE) MCG/ACT inhaler Inhale 1-2 puffs into the lungs every 4 (four) hours as needed. For shortness of breath. 1 Inhaler 3  . albuterol (PROVENTIL) (5 MG/ML) 0.5% nebulizer solution Take 0.5 mLs (2.5 mg total) by nebulization every 4 (four) hours as needed for wheezing or shortness of breath. 20 mL 12  . ipratropium (ATROVENT) 0.02 % nebulizer solution Take 2.5 mLs (  0.5 mg total) by nebulization every 4 (four) hours as needed for wheezing. 75 mL 12  . losartan-hydrochlorothiazide (HYZAAR) 100-12.5 MG per tablet Take 1 tablet by mouth daily.    . meclizine (ANTIVERT) 25 MG tablet Take 1 tablet (25 mg total) by mouth 3 (three) times daily as needed. 30 tablet 0  . metFORMIN (GLUCOPHAGE) 500 MG tablet Take 500 mg by mouth 2 (two) times daily with a meal.     . tiotropium (SPIRIVA) 18 MCG inhalation capsule Place 1 capsule (18 mcg total) into inhaler and inhale daily. 30 capsule 0   No current  facility-administered medications on file prior to visit.    ALLERGIES: Allergies  Allergen Reactions  . Aspartame And Phenylalanine Nausea And Vomiting    Patient says she is allergic to all artificial sweeteners  . Tramadol Shortness Of Breath and Nausea Only  . Codeine Itching  . Sulfa Antibiotics Other (See Comments)    Kidney problem   . Penicillins Rash    Has patient had a PCN reaction causing immediate rash, facial/tongue/throat swelling, SOB or lightheadedness with hypotension: No Has patient had a PCN reaction causing severe rash involving mucus membranes or skin necrosis: No Has patient had a PCN reaction that required hospitalization No Has patient had a PCN reaction occurring within the last 10 years: No If all of the above answers are "NO", then may proceed with Cephalosporin use.  . Prednisone Rash    Patient stated she received Prednisone while she was in the hospital and experienced a rash and "extreme pain.'    FAMILY HISTORY: Family History  Problem Relation Age of Onset  . Heart attack Mother   . Diabetes Maternal Grandfather   . Thyroid disease Neg Hx   . Lung disease Neg Hx   . Heart attack Maternal Grandfather   . Heart attack Maternal Grandmother   . Hypertension Maternal Grandmother   . Hypertension Maternal Grandfather   . Stroke Maternal Grandfather     SOCIAL HISTORY: Social History   Social History  . Marital Status: Divorced    Spouse Name: N/A  . Number of Children: Y  . Years of Education: N/A   Occupational History  . unemployed    Social History Main Topics  . Smoking status: Current Every Day Smoker -- 0.50 packs/day for 41 years    Types: Cigarettes    Start date: 03/19/1974  . Smokeless tobacco: Never Used     Comment: Peak rate of 1.5ppd  . Alcohol Use: No  . Drug Use: No  . Sexual Activity: No   Other Topics Concern  . Not on file   Social History Narrative   Daughter Edmonia Lynch) [phone]   Originally from  [place]. Previously lived in [place]. She has traveled from [facility] to [place] & has also traveled across country to [place]. Multiple visits to [place], [place], [place], [place], [place]. She has a dog at home. No bird, mold, or hot tub exposure. She reports some years ago she did live in a home that had black mold in her closet. 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       Pt lives with her youngest daughter, lives in a trailer, does have stairs into home. Does have some troubled with that. Highest level of education is GED.     REVIEW OF SYSTEMS: Constitutional: fatigue Eyes: as  above Ear, nose and throat: No hearing loss, ear pain, nasal congestion, sore throat Cardiovascular: as above Respiratory:  Occasional shortness of breath GastrointestinaI: No nausea, vomiting, diarrhea, abdominal pain, fecal incontinence Genitourinary:  No dysuria, urinary retention or frequency Musculoskeletal:  Neck pain, back pain Integumentary: No rash, pruritus, skin lesions Neurological: as above Psychiatric: No depression, insomnia, anxiety Endocrine: No palpitations, fatigue, diaphoresis, mood swings, change in appetite, change in weight, increased thirst Hematologic/Lymphatic:  No anemia, purpura, petechiae. Allergic/Immunologic: no itchy/runny eyes, nasal congestion, recent allergic reactions, rashes  PHYSICAL EXAM: Filed Vitals:   07/29/15 0950  BP: 168/70  Pulse: 72   General: No acute distress.  Head:  Normocephalic/atraumatic Eyes:  fundi unremarkable, without vessel changes, exudates, hemorrhages or papilledema. Neck: supple, paraspinal tenderness, full range of motion Back: paraspinal tenderness Heart: regular rate and rhythm Lungs: Clear to auscultation bilaterally. Vascular: No carotid bruits. Neurological Exam: Mental status: alert and oriented to person, place, and time, recent and  remote memory intact, fund of knowledge intact, attention and concentration intact, speech fluent and not dysarthric, language intact. Cranial nerves: CN I: not tested CN II: pupils equal, round and reactive to light, visual fields intact, fundi unremarkable, without vessel changes, exudates, hemorrhages or papilledema. CN III, IV, VI:  full range of motion, no nystagmus, no ptosis CN V: facial sensation intact CN VII: upper and lower face symmetric CN VIII: hearing intact CN IX, X: gag intact, uvula midline CN XI: sternocleidomastoid and trapezius muscles intact CN XII: tongue midline Bulk & Tone: normal, no fasciculations. Motor:  5/5 throughout  Sensation:  Reduced pinprick sensation in hands and feet.  Reduced vibration sensation in toes. Deep Tendon Reflexes:  2+ throughout, toes downgoing. Finger to nose testing:  Without dysmetria.  She did exhibit a mild postural tremor, worse on the left, that would stop and goe. Heel to shin:  Without dysmetria.  Gait:  Antalgic gait.  Unable to tandem walk.    IMPRESSION: Chronic tension type headaches Episodic vertigo Psychogenic tremor.  Tremor stops and goes.  I have no explanation for the occasional "jerking spells" Chronic pain Hypertension Tobacco abuse  PLAN: 1.  To treat headaches, we can consider gabapentin, however patient would like to not pursue this due to potential side effects such as dizziness 2.  Realistically, the only treatment for the vertigo would be vestibular rehab, however this may unfortunately be a chronic issue 3.  Consider referral to pain specialist 4.  Discussed smoking cessation 5.  Should have BP rechecked with PCP. 6.  Follow up as needed.  Thank you for allowing me to take part in the care of this patient.  Metta Clines, DO  CC:  Berton Lan, PA-C

## 2015-07-29 NOTE — Progress Notes (Signed)
Chart sent to PCP

## 2015-07-29 NOTE — Patient Instructions (Signed)
1.  For headaches, consider gabapentin, although side effects include dizziness 2.  Recommend referral to pain specialist 3.  Follow up as needed

## 2015-08-03 ENCOUNTER — Other Ambulatory Visit: Payer: Self-pay | Admitting: Orthopaedic Surgery

## 2015-08-04 ENCOUNTER — Other Ambulatory Visit: Payer: Self-pay | Admitting: Endocrinology

## 2015-08-04 DIAGNOSIS — E041 Nontoxic single thyroid nodule: Secondary | ICD-10-CM

## 2015-08-06 ENCOUNTER — Ambulatory Visit
Admission: RE | Admit: 2015-08-06 | Discharge: 2015-08-06 | Disposition: A | Discharge status: Home / Self Care | Payer: Medicare Other | Source: Ambulatory Visit | Attending: Endocrinology | Admitting: Endocrinology

## 2015-08-06 DIAGNOSIS — E041 Nontoxic single thyroid nodule: Secondary | ICD-10-CM

## 2015-08-06 DIAGNOSIS — E042 Nontoxic multinodular goiter: Secondary | ICD-10-CM | POA: Diagnosis not present

## 2015-08-12 NOTE — Progress Notes (Signed)
Quick Note:  Please let patient know that the thyroid nodule is not changed since 4/16 and would not want a biopsy this again but check it again in 6 months; however if she is having more difficulty swallowing we will need to consider surgery ______

## 2015-08-31 ENCOUNTER — Telehealth: Payer: Self-pay | Admitting: Endocrinology

## 2015-08-31 NOTE — Telephone Encounter (Signed)
Olin Hauser with [facility] [phone] calling to speak with [name], the pt has had a thyroid ultrasound and it does not show the need for a biopsy, please let her know if this is still needed.

## 2015-08-31 NOTE — Telephone Encounter (Signed)
Message left on her Voice mail letting her know that the patient does not need a biopsy.

## 2015-09-10 ENCOUNTER — Ambulatory Visit: Payer: Medicare Other | Admitting: Pulmonary Disease

## 2015-09-10 ENCOUNTER — Ambulatory Visit: Payer: Medicare Other

## 2015-10-20 ENCOUNTER — Ambulatory Visit (INDEPENDENT_AMBULATORY_CARE_PROVIDER_SITE_OTHER): Payer: Medicare Other | Admitting: Pulmonary Disease

## 2015-10-20 ENCOUNTER — Encounter: Payer: Self-pay | Admitting: Pulmonary Disease

## 2015-10-20 ENCOUNTER — Ambulatory Visit (INDEPENDENT_AMBULATORY_CARE_PROVIDER_SITE_OTHER)
Admission: RE | Admit: 2015-10-20 | Discharge: 2015-10-20 | Disposition: A | Discharge status: Home / Self Care | Payer: Medicare Other | Source: Ambulatory Visit | Attending: Pulmonary Disease | Admitting: Pulmonary Disease

## 2015-10-20 VITALS — BP 124/72 | HR 66 | Ht 63.0 in | Wt 166.2 lb

## 2015-10-20 DIAGNOSIS — R05 Cough: Secondary | ICD-10-CM | POA: Diagnosis not present

## 2015-10-20 DIAGNOSIS — K219 Gastro-esophageal reflux disease without esophagitis: Secondary | ICD-10-CM | POA: Diagnosis not present

## 2015-10-20 DIAGNOSIS — R0600 Dyspnea, unspecified: Secondary | ICD-10-CM | POA: Diagnosis not present

## 2015-10-20 DIAGNOSIS — J441 Chronic obstructive pulmonary disease with (acute) exacerbation: Secondary | ICD-10-CM

## 2015-10-20 DIAGNOSIS — F17200 Nicotine dependence, unspecified, uncomplicated: Secondary | ICD-10-CM

## 2015-10-20 MED ORDER — HYDROCODONE-ACETAMINOPHEN 5-325 MG PO TABS: 0.5000 | ORAL_TABLET | Freq: Four times a day (QID) | ORAL | Status: DC | PRN

## 2015-10-20 MED ORDER — DOXYCYCLINE HYCLATE 100 MG PO TABS: 100.0000 mg | ORAL_TABLET | Freq: Two times a day (BID) | ORAL | Status: DC

## 2015-10-20 MED ORDER — BENZONATATE 100 MG PO CAPS: 100.0000 mg | ORAL_CAPSULE | Freq: Three times a day (TID) | ORAL | Status: DC | PRN

## 2015-10-20 NOTE — Progress Notes (Signed)
RESULT REVIEWED.

## 2015-10-20 NOTE — Progress Notes (Signed)
Subjective:    Patient ID: Victoria Bird, female    DOB: 05/17/1946, 70 y.o.   MRN: [id number]  C.C.:  Follow-up with Severe COPD, Chronic Hypoxic Respiratory Failure, OSA, GERD, & Ongoing Tobacco Use.  HPI Severe COPD: Alpha-1 antitrypsin phenotype ordered at last appointment but not completed. She reports she has had a cough since 12/25. Cough is intermittent & nonproductive. Compliant with Spiriva. She is using her rescue inhaler intermittently throughout the day. Wheezing intermittently. Does wake up at night coughing & wheezing. Patient reports she has tolerated Vicodin in the past.   Chronic Hypoxic Respiratory Failure: Patient again today demonstrated no oxygen requirement with ambulation.   OSA: Last polysomnogram was 2010.  Previously had trouble falling asleep.   GERD:  No reflux or dyspepsia. Continues to take Zantac as prescribed.  Ongoing Tobacco Use: Previously patient reports she has been unable to quit due to psychosocial stressors. She does continue to try to quit smoking. Smoking intermittently still.  Review of Systems She reports significant chest & back pain with her cough. Previously had diarrhea that has resolved. No nausea or emesis. She reports she has had hot & cold chills but no sweats or subjective fever.  Allergies  Allergen Reactions  . Aspartame And Phenylalanine Nausea And Vomiting    Patient says she is allergic to all artificial sweeteners  . Tramadol Shortness Of Breath and Nausea Only  . Codeine Itching  . Sulfa Antibiotics Other (See Comments)    Kidney problem   . Penicillins Rash    Has patient had a PCN reaction causing immediate rash, facial/tongue/throat swelling, SOB or lightheadedness with hypotension: No Has patient had a PCN reaction causing severe rash involving mucus membranes or skin necrosis: No Has patient had a PCN reaction that required hospitalization No Has patient had a PCN reaction occurring within the last 10 years:  No If all of the above answers are "NO", then may proceed with Cephalosporin use.  . Prednisone Rash    Patient stated she received Prednisone while she was in the hospital and experienced a rash and "extreme pain.'   Current Outpatient Prescriptions on File Prior to Visit  Medication Sig Dispense Refill  . albuterol (PROVENTIL HFA;VENTOLIN HFA) 108 (90 BASE) MCG/ACT inhaler Inhale 1-2 puffs into the lungs every 4 (four) hours as needed. For shortness of breath. 1 Inhaler 3  . albuterol (PROVENTIL) (5 MG/ML) 0.5% nebulizer solution Take 0.5 mLs (2.5 mg total) by nebulization every 4 (four) hours as needed for wheezing or shortness of breath. 20 mL 12  . ipratropium (ATROVENT) 0.02 % nebulizer solution Take 2.5 mLs (0.5 mg total) by nebulization every 4 (four) hours as needed for wheezing. 75 mL 12  . losartan-hydrochlorothiazide (HYZAAR) 100-12.5 MG per tablet Take 1 tablet by mouth daily.    . meclizine (ANTIVERT) 25 MG tablet Take 1 tablet (25 mg total) by mouth 3 (three) times daily as needed. 30 tablet 0  . metFORMIN (GLUCOPHAGE) 500 MG tablet Take 500 mg by mouth 2 (two) times daily with a meal.     . ranitidine (ZANTAC) 150 MG tablet Take 150 mg by mouth 2 (two) times daily.    Marland Kitchen tiotropium (SPIRIVA) 18 MCG inhalation capsule Place 1 capsule (18 mcg total) into inhaler and inhale daily. 30 capsule 0   No current facility-administered medications on file prior to visit.   Past Medical History  Diagnosis Date  . Diabetes mellitus   . Hypertension   . COPD (chronic  obstructive pulmonary disease) ([place])   . Chronic pain   . Anxiety   . Tracheobronchitis 01/01/2012  . High cholesterol   . OSA (obstructive sleep apnea)   . H/O: pneumonia    Past Surgical History  Procedure Laterality Date  . Appendectomy    . Cholecystectomy    . Abdominal hysterectomy    . Fracture surgery    . Rotator cuff repair    . Carpal tunnel release Left    Family History  Problem Relation Age of Onset   . Heart attack Mother   . Diabetes Maternal Grandfather   . Thyroid disease Neg Hx   . Lung disease Neg Hx   . Heart attack Maternal Grandfather   . Heart attack Maternal Grandmother   . Hypertension Maternal Grandmother   . Hypertension Maternal Grandfather   . Stroke Maternal Grandfather    Social History   Social History  . Marital Status: Divorced    Spouse Name: N/A  . Number of Children: Y  . Years of Education: N/A   Occupational History  . unemployed    Social History Main Topics  . Smoking status: Current Every Day Smoker -- 0.50 packs/day for 41 years    Types: Cigarettes    Start date: 03/19/1974  . Smokeless tobacco: Never Used     Comment: Peak rate of 1.5ppd  . Alcohol Use: No  . Drug Use: No  . Sexual Activity: No   Other Topics Concern  . None   Social History Narrative   Daughter Edmonia Lynch) [phone]   Originally from [place]. Previously lived in [place]. She has traveled from [place] to [place] & has also traveled across country to [place]. Multiple visits to [place], [place], [place], [place], [place]. She has a dog at home. No bird, mold, or hot tub exposure. She reports some years ago she did live in a home that had black mold in her closet. 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       Pt lives with her youngest daughter, lives in a trailer, does have stairs into home. Does have some troubled with that. Highest level of education is GED.       Objective:   Physical Exam BP 124/72 mmHg  Pulse 66  Ht 5\' 3"  (1.6 m)  Wt 166 lb 3.2 oz (75.388 kg)  BMI 29.45 kg/m2  SpO2 95% General:  Awake. Appears uncomfortable. No distress. Integument:  Warm & dry. No rash on exposed skin. HEENT:  Moist mucus membranes. No scleral icterus. No oral ulcers. Cardiovascular:  Regular rate & rhythm. No edema. Normal S1 & S2. Pulmonary:  Good aeration  bilaterally but slightly decreased in the bases. Speaking in complete sentences. Normal work of breathing. Abdomen: Soft. Normal bowel sounds. Mildly protuberant.  Musculoskeletal:  Normal bulk and tone. No joint deformity or effusion appreciated.  PFT 06/09/15: FVC 2.21 L (73%) FEV1 1.08 L (47%) FEV1/FVC 0.49 FEF 25-75 0.43 L (22%) no bronchodilator response TLC 5.26 L (105%) RV 135% ERV 87% DLCO corrected 36% (Hgb 16.2)  6MWT 10/20/15:  Walked 240 meters / Baseline Sat 94% on RA / Nadir Sat 90% on RA 06/22/15:  3 laps around office. Baseline 93% on RA. Nadir Sat 89% on RA. 06/18/15:  Walked in hospital by PT / Baseline Sat 90% on RA / Nadir Sat 86% on RA (required 3  L/m on exertion to maintain)  IMAGING CXR PA/LAT 10/08/14 (previously reviewed by me): Mild persistent linear opacity consistent with scar at the right lung base. Kyphosis noted. Hyperinflation with some flattening of the diaphragm. No pleural effusion or thickening appreciated. No parenchymal opacity or nodule appreciated. Heart normal in size. Mediastinum: Contour.  CT CHEST W/ 04/16/02 (per radiologist): No evidence for pulmonary emboli. No pathologically enlarged mediastinal or hilar lymph nodes. Enlarged lower pole of left thyroid lobe with a nodular configuration. Mild water density alveolitis with an anterior portion of left upper lobe & medial anterior portion of right upper lobe. No parenchymal nodules.   CARDIAC TTE (01/08/13): LV normal in size. EF 60-65%. Normal wall motion without regional abnormality. LA & RA normal in size. RV normal in size and function. Pulmonary artery systolic pressure 43 mmHg. Trivial aortic regurgitation. No mitral stenosis or regurgitation. No pulmonic or tricuspid regurgitation. No pericardial effusion.  LABS 05/27/15 Alpha-1 antitrypsin: 164  04/29/15 BMP: 140/4.2/102/24/16/1.01/93/9 LFT: 4/6.7/0.7/67/14/13  10/09/14 VBG (on room air): 7.39/37/59  03/24/13 ANA: Negative  RF:  <7.0     Assessment & Plan:  70 year old female with underlying severe COPD and acute exacerbation. I spent over 3 minutes counseling the patient will need for complete tobacco cessation. She does seem to have more of an acute bronchitis that likely was precipitated by a virus given her gastrointestinal complaints. The patient does not appear to be in extremities. I reviewed her extensive medication allergy list and given the absence of wheezing I feel it's reasonable to avoid oral steroid medications at this time. With her subjective symptoms that would suggest bacterial superinfection I will treat with a short course of antibiotics. I reviewed her walk test today which shows excellent effort without evidence of desaturation on room air. I instructed the patient to contact my office if she had any new breathing problems before her next appointment or clinical worsening before then.   1. Severe COPD with exacerbation: Continuing Spiriva. Plan to check alpha-1 antitrypsin phenotype at 3 month follow-up. Repeat spirometry with bronchodilator challenge at next appointment in 3 months. Treated with doxycycline 100 mg by mouth twice a day 7 days & Tessalon Perles for cough suppression. Checking chest x-ray PA/LAT today. 2. Chest wall pain: Secondary to frequent coughing. Patient has tolerated Vicodin in the past. Prescribing a limited supply. 3. Ongoing tobacco use: Spelled over 3 minutes counseling the patient on the need for complete tobacco cessation. She is continuing to actively try to quit. 4. GERD: Controlled with Zantac. No changes. 5. OSA: Unclear severity. Plan to readdress after next appointment. 6. Prior hypoxic respiratory failure: No evidence for hypoxia with ambulation today. 7. Follow-up: Patient will be scheduled for a follow-up appointment in our clinic in 1-2 weeks to ensure clinical improvement. Plan for a follow-up appointment with me in 3 months.

## 2015-10-20 NOTE — Patient Instructions (Signed)
1. Avoid taking your doxycycline with dairy products. Take with a full glass of water and remain upright for 1 hour afterward so that it does not cause use any stomach or chest discomfort. 2. Please call me if she do not notice significant improvement in the next couple of days. 3. You can cancel your follow-up appointment in 1-2 weeks if you have significantly improved. 4. We will do breathing tests at her next appointment in 3 months. 5. We will see you back in 1-2 weeks and I will see you back in 3 months.  TESTS ORDERED: 1. Chest x-ray PA/LAT today. 2. Spirometry with bronchodilator challenge at 3 month follow-up appointment

## 2015-10-22 ENCOUNTER — Telehealth: Payer: Self-pay | Admitting: Pulmonary Disease

## 2015-10-22 MED ORDER — AZITHROMYCIN 250 MG PO TABS: ORAL_TABLET | ORAL | Status: DC

## 2015-10-22 NOTE — Telephone Encounter (Signed)
Spoke with pt. States that [facility] put her on doxy on 10/20/15. Feels like this is causing side effects. Reports nausea, headache and double vision. Has been taking doxy with food and voiding dairy products per JN. Would like this switched to something else.  VS - please advise. Thanks.

## 2015-10-22 NOTE — Telephone Encounter (Signed)
Spoke with pt, aware of recs.  rx sent to preferred pharmacy.  Nothing further needed.  

## 2015-10-22 NOTE — Telephone Encounter (Signed)
Can send zpak

## 2015-11-03 ENCOUNTER — Ambulatory Visit (INDEPENDENT_AMBULATORY_CARE_PROVIDER_SITE_OTHER): Payer: Medicare Other | Admitting: Acute Care

## 2015-11-03 ENCOUNTER — Encounter: Payer: Self-pay | Admitting: Acute Care

## 2015-11-03 ENCOUNTER — Other Ambulatory Visit: Payer: Self-pay | Admitting: Acute Care

## 2015-11-03 VITALS — BP 116/64 | HR 92 | Ht 63.0 in | Wt 167.6 lb

## 2015-11-03 DIAGNOSIS — K219 Gastro-esophageal reflux disease without esophagitis: Secondary | ICD-10-CM

## 2015-11-03 DIAGNOSIS — Z7689 Persons encountering health services in other specified circumstances: Secondary | ICD-10-CM

## 2015-11-03 DIAGNOSIS — Z7189 Other specified counseling: Secondary | ICD-10-CM | POA: Diagnosis not present

## 2015-11-03 DIAGNOSIS — G4733 Obstructive sleep apnea (adult) (pediatric): Secondary | ICD-10-CM

## 2015-11-03 DIAGNOSIS — Z8701 Personal history of pneumonia (recurrent): Secondary | ICD-10-CM

## 2015-11-03 DIAGNOSIS — F17200 Nicotine dependence, unspecified, uncomplicated: Secondary | ICD-10-CM

## 2015-11-03 DIAGNOSIS — R1115 Cyclical vomiting syndrome unrelated to migraine: Secondary | ICD-10-CM

## 2015-11-03 DIAGNOSIS — G43A0 Cyclical vomiting, not intractable: Secondary | ICD-10-CM | POA: Diagnosis not present

## 2015-11-03 MED ORDER — AZITHROMYCIN 250 MG PO TABS: ORAL_TABLET | ORAL | Status: DC

## 2015-11-03 MED ORDER — BENZONATATE 100 MG PO CAPS: 100.0000 mg | ORAL_CAPSULE | Freq: Three times a day (TID) | ORAL | Status: DC | PRN

## 2015-11-03 NOTE — Patient Instructions (Addendum)
It is nice to meet you today. We will refer you to [facility].to establish with them as a patient. Establish yourself with the [facility] primary care . Call [phone] We will phone in prescription for a z-pack. We are sending your prescription for Tessalon Pearls to [facility]. The cash price there is $8.57. Please return in 2 weeks for follow up appointment and chest xray. Follow up with Dr. Ashok Cordia 01/28/15 as scheduled. Please contact office for sooner follow up if symptoms do not improve or worsen or seek emergency care

## 2015-11-03 NOTE — Progress Notes (Signed)
Subjective:    Patient ID: Victoria Bird, female    DOB: 07-11-1946, 70 y.o.   MRN: [id number]  HPI Severe COPD, chronic hypoxic respiratory failure, OSA, GERD: Counseled her last appointment to continue using Spiriva. She reports her dyspnea is at baseline. She has intermittent cough productive of a minimal amount of clear phlegm. No hemoptysis. Occasional wheezing mostly with exertion. She reports she is compliant with Spiriva. She uses her rescue inhaler as much as 2-3 times daily. Patient has previously tried Advair which seemed to worsen her breathing and cause chest tightness. PFT 06/09/15: FVC 2.21 L (73%) FEV1 1.08 L (47%) FEV1/FVC 0.49 FEF 25-75 0.43 L (22%) no bronchodilator response TLC 5.26 L (105%) RV 135% ERV 87% DLCO corrected 36% (Hgb 16.2)  6MWT 06/22/15: 3 laps around office. Baseline 93% on RA. Nadir Sat 89% on RA. 06/18/15: Walked in hospital by PT / Baseline Sat 90% on RA / Nadir Sat 86% on RA (required 3 L/m on exertion to maintain)  IMAGING CXR PA/LAT 10/08/14 (previously reviewed by me): Mild persistent linear opacity consistent with scar at the right lung base. Kyphosis noted. Hyperinflation with some flattening of the diaphragm. No pleural effusion or thickening appreciated. No parenchymal opacity or nodule appreciated. Heart normal in size. Mediastinum: Contour.  CT CHEST W/ 04/16/02 (per radiologist): No evidence for pulmonary emboli. No pathologically enlarged mediastinal or hilar lymph nodes. Enlarged lower pole of left thyroid lobe with a nodular configuration. Mild water density alveolitis with an anterior portion of left upper lobe & medial anterior portion of right upper lobe. No parenchymal nodules.   CXR PA/LAT 10/20/15  1. Borderline cardiomegaly. 2. Mild bilateral pulmonary interstitial prominence. These changes may be related to mild pulmonary interstitial edema and/or pneumonitis. 3. Mild right base subsegmental atelectasis and or infiltrate.  Chest findings are new from prior study of 06/16/2015 .  CARDIAC TTE (01/08/13): LV normal in size. EF 60-65%. Normal wall motion without regional abnormality. LA & RA normal in size. RV normal in size and function. Pulmonary artery systolic pressure 43 mmHg. Trivial aortic regurgitation. No mitral stenosis or regurgitation. No pulmonic or tricuspid regurgitation. No pericardial effusion.  LABS 05/27/15 Alpha-1 antitrypsin: 164  04/29/15 BMP: 140/4.2/102/24/16/1.01/93/9 LFT: 4/6.7/0.7/67/14/13  10/09/14 VBG (on room air): 7.39/37/59  03/24/13 ANA: Negative  RF: <7.0  Current outpatient prescriptions:  .  albuterol (PROVENTIL HFA;VENTOLIN HFA) 108 (90 BASE) MCG/ACT inhaler, Inhale 1-2 puffs into the lungs every 4 (four) hours as needed. For shortness of breath., Disp: 1 Inhaler, Rfl: 3 .  albuterol (PROVENTIL) (5 MG/ML) 0.5% nebulizer solution, Take 0.5 mLs (2.5 mg total) by nebulization every 4 (four) hours as needed for wheezing or shortness of breath., Disp: 20 mL, Rfl: 12 .  HYDROcodone-acetaminophen (NORCO) 5-325 MG tablet, Take 0.5 tablets by mouth every 6 (six) hours as needed for moderate pain., Disp: 14 tablet, Rfl: 0 .  ipratropium (ATROVENT) 0.02 % nebulizer solution, Take 2.5 mLs (0.5 mg total) by nebulization every 4 (four) hours as needed for wheezing., Disp: 75 mL, Rfl: 12 .  losartan-hydrochlorothiazide (HYZAAR) 100-12.5 MG per tablet, Take 1 tablet by mouth daily., Disp: , Rfl:  .  meclizine (ANTIVERT) 25 MG tablet, Take 1 tablet (25 mg total) by mouth 3 (three) times daily as needed., Disp: 30 tablet, Rfl: 0 .  metFORMIN (GLUCOPHAGE) 500 MG tablet, Take 500 mg by mouth 2 (two) times daily with a meal. , Disp: , Rfl:  .  ranitidine (ZANTAC) 150 MG tablet, Take  150 mg by mouth 2 (two) times daily. Reported on 11/03/2015, Disp: , Rfl:  .  tiotropium (SPIRIVA) 18 MCG inhalation capsule, Place 1 capsule (18 mcg total) into inhaler and inhale daily., Disp: 30 capsule, Rfl: 0 .   azithromycin (ZITHROMAX) 250 MG tablet, Take as directed, Disp: 6 tablet, Rfl: 0 .  benzonatate (TESSALON) 100 MG capsule, Take 1 capsule (100 mg total) by mouth 3 (three) times daily as needed for cough., Disp: 30 capsule, Rfl: 0   Past Medical History  Diagnosis Date  . Diabetes mellitus   . Hypertension   . COPD (chronic obstructive pulmonary disease) ([place])   . Chronic pain   . Anxiety   . Tracheobronchitis 01/01/2012  . High cholesterol   . OSA (obstructive sleep apnea)   . H/O: pneumonia    Allergies  Allergen Reactions  . Aspartame And Phenylalanine Nausea And Vomiting    Patient says she is allergic to all artificial sweeteners  . Doxycycline     Nausea, vomiting, HA, double vision  . Tramadol Shortness Of Breath and Nausea Only  . Codeine Itching  . Sulfa Antibiotics Other (See Comments)    Kidney problem   . Penicillins Rash    Has patient had a PCN reaction causing immediate rash, facial/tongue/throat swelling, SOB or lightheadedness with hypotension: No Has patient had a PCN reaction causing severe rash involving mucus membranes or skin necrosis: No Has patient had a PCN reaction that required hospitalization No Has patient had a PCN reaction occurring within the last 10 years: No If all of the above answers are "NO", then may proceed with Cephalosporin use.  . Prednisone Rash    Patient stated she received Prednisone while she was in the hospital and experienced a rash and "extreme pain.'   ROV 11/03/15  Patient returns after treatment with a Z-Pak after having a reaction to the doxycycline originally prescribed on October 20, 2015. She states she is better but still not completely well. States she has continued shortness of breath continued coughing some chest tightness. She states she has some wheezing at night. She denies fever, chills, chest pain or hemoptysis. She did not fill the prescription for Tessalon Perles due to cost. She states she is compliant with her  Spiriva every day and continues to use her Ventolin HFA as her rescue medicine 2-3 times daily. States she is compliant with her Zantac daily. She refuses to use CPAP for her OSA.  Review of Systems Constitutional:   No  weight loss, night sweats,  Fevers, chills, fatigue, or  lassitude.  HEENT:   No headaches,  Difficulty swallowing,  Tooth/dental problems, or  Sore throat,                No sneezing, itching, ear ache, nasal congestion, post nasal drip,   CV:  No chest pain,  Orthopnea, PND, swelling in lower extremities, anasarca, dizziness, palpitations, syncope.   GI  No heartburn, indigestion, abdominal pain, nausea, vomiting, diarrhea, change in bowel habits, loss of appetite, bloody stools.   Resp: + shortness of breath with exertion not at rest.  No excess mucus, no productive cough,  + non-productive cough,  No coughing up of blood.  + change in color of mucus.  + wheezing.  No chest wall deformity  Skin: no rash or lesions.  GU: no dysuria, change in color of urine, no urgency or frequency.  No flank pain, no hematuria   MS:  No joint pain or swelling.  No decreased range of motion.  No back pain.  Psych:  No change in mood or affect. No depression or anxiety.  No memory loss.        Objective:   Physical Exam  BP 116/64 mmHg  Pulse 92  Ht 5\' 3"  (1.6 m)  Wt 167 lb 9.6 oz (76.023 kg)  BMI 29.70 kg/m2  SpO2 94%  Physical Exam:  General- No distress,  A&Ox3, elderly female ENT: No sinus tenderness, TM clear, pale nasal mucosa, no oral exudate,no post nasal drip, no LAN Cardiac: S1, S2, regular rate and rhythm, no murmur Chest: + wheeze/no rales/+ dullness in right lower lobe; no accessory muscle use, no nasal flaring, no sternal retractions Abd.: Soft Non-tender Ext: No edema Neuro:  normal strength Skin: No rashes, warm and dry Psych: normal mood and behavior     Assessment & Plan:

## 2015-11-03 NOTE — Assessment & Plan Note (Signed)
Patient states she has been diagnosed with obstructive sleep apnea. Patient states she will not wear CPAP as treatment.  Plan: Discussed with patient the risks associated with untreated obstructive sleep apnea. I explained that by refusing to wear CPAP device she is putting herself at risk for high blood pressure, stroke, heart failure, irregular heartbeats, and heart attack. I have asked her to reconsider her refusal of CPAP as treatment. She verbalized understanding and stated she would think about it.

## 2015-11-03 NOTE — Assessment & Plan Note (Addendum)
Pt. Has continued to feel bad after treatment with zpack. She refuses to take prednisone  Plan: Z-Pak Return in 2 weeks for follow-up chest x-ray and office appointment. Patient refuses to take any prednisone. Patient refused prescription for Levaquin for continued treatment due to cost Has agreed to a repeat of Z-Pak and reevaluation in 2 weeks. He understands if she does not get better we will escalate treatment to Levaquin.

## 2015-11-03 NOTE — Assessment & Plan Note (Signed)
Patient continues to smoke despite chronic respiratory failure. Plan:  Discussed with patient the necessity of smoking cessation. Discussed that quitting smoking is the single most powerful action that she can take to improve the health of her lungs and her heart, and to decrease her risk of lung cancer. Patient verbalized understanding.

## 2015-11-16 ENCOUNTER — Encounter: Payer: Self-pay | Admitting: Internal Medicine

## 2015-11-17 ENCOUNTER — Ambulatory Visit (INDEPENDENT_AMBULATORY_CARE_PROVIDER_SITE_OTHER): Payer: Medicare Other | Admitting: Acute Care

## 2015-11-17 ENCOUNTER — Ambulatory Visit (INDEPENDENT_AMBULATORY_CARE_PROVIDER_SITE_OTHER)
Admission: RE | Admit: 2015-11-17 | Discharge: 2015-11-17 | Disposition: A | Discharge status: Home / Self Care | Payer: Medicare Other | Source: Ambulatory Visit | Attending: Acute Care | Admitting: Acute Care

## 2015-11-17 ENCOUNTER — Encounter: Payer: Self-pay | Admitting: Acute Care

## 2015-11-17 VITALS — BP 142/86 | HR 73 | Ht 63.0 in | Wt 167.0 lb

## 2015-11-17 DIAGNOSIS — Z8701 Personal history of pneumonia (recurrent): Secondary | ICD-10-CM

## 2015-11-17 DIAGNOSIS — J410 Simple chronic bronchitis: Secondary | ICD-10-CM | POA: Diagnosis not present

## 2015-11-17 DIAGNOSIS — R05 Cough: Secondary | ICD-10-CM | POA: Diagnosis not present

## 2015-11-17 DIAGNOSIS — J302 Other seasonal allergic rhinitis: Secondary | ICD-10-CM | POA: Diagnosis not present

## 2015-11-17 DIAGNOSIS — G4733 Obstructive sleep apnea (adult) (pediatric): Secondary | ICD-10-CM | POA: Diagnosis not present

## 2015-11-17 DIAGNOSIS — J309 Allergic rhinitis, unspecified: Secondary | ICD-10-CM | POA: Insufficient documentation

## 2015-11-17 DIAGNOSIS — F17200 Nicotine dependence, unspecified, uncomplicated: Secondary | ICD-10-CM

## 2015-11-17 DIAGNOSIS — R0602 Shortness of breath: Secondary | ICD-10-CM | POA: Diagnosis not present

## 2015-11-17 NOTE — Progress Notes (Signed)
Subjective:    Patient ID: Victoria Bird, female    DOB: [birth year], 70 y.o.   MRN: [id number]  HPI  70 year old female with Severe COPD, chronic hypoxic respiratory failure, OSA, GERD: Counseled her last appointment to continue using Spiriva.  She reports she is compliant with Spiriva. She uses her rescue inhaler as much as 2-3 times daily. Patient has previously tried Advair which seemed to worsen her breathing and cause chest tightness.  Significant Tests/ Procedures: PFT 06/09/15: FVC 2.21 L (73%) FEV1 1.08 L (47%) FEV1/FVC 0.49 FEF 25-75 0.43 L (22%) no bronchodilator response TLC 5.26 L (105%) RV 135% ERV 87% DLCO corrected 36% (Hgb 16.2)  6MWT 06/22/15: 3 laps around office. Baseline 93% on RA. Nadir Sat 89% on RA. 06/18/15: Walked in hospital by PT / Baseline Sat 90% on RA / Nadir Sat 86% on RA (required 3 L/m on exertion to maintain)  IMAGING CXR PA/LAT 10/08/14 (previously reviewed by me): Mild persistent linear opacity consistent with scar at the right lung base. Kyphosis noted. Hyperinflation with some flattening of the diaphragm. No pleural effusion or thickening appreciated. No parenchymal opacity or nodule appreciated. Heart normal in size. Mediastinum: Contour.  CT CHEST W/ 04/16/02 (per radiologist): No evidence for pulmonary emboli. No pathologically enlarged mediastinal or hilar lymph nodes. Enlarged lower pole of left thyroid lobe with a nodular configuration. Mild water density alveolitis with an anterior portion of left upper lobe & medial anterior portion of right upper lobe. No parenchymal nodules.   CXR PA/LAT 10/20/15  1. Borderline cardiomegaly. 2. Mild bilateral pulmonary interstitial prominence. These changes may be related to mild pulmonary interstitial edema and/or pneumonitis. 3. Mild right base subsegmental atelectasis and or infiltrate. Chest findings are new from prior study of 06/16/2015 .  CXR 11/17/15 :  EXAM: CHEST 2 VIEW  COMPARISON: PA  and lateral chest x-ray of October 20, 2015  FINDINGS: The lungs remain mildly hyperinflated. The interstitial markings are coarse. Infiltrate at the right lung base has cleared. The heart and pulmonary vascularity are normal. The mediastinum is normal in width. There is no pleural effusion or pneumothorax. The bony thorax exhibits no acute abnormality.  IMPRESSION: Chronic bronchitic changes. Interval clearing of infiltrate in the right middle lobe.  CARDIAC TTE (01/08/13): LV normal in size. EF 60-65%. Normal wall motion without regional abnormality. LA & RA normal in size. RV normal in size and function. Pulmonary artery systolic pressure 43 mmHg. Trivial aortic regurgitation. No mitral stenosis or regurgitation. No pulmonic or tricuspid regurgitation. No pericardial effusion.  LABS 05/27/15 Alpha-1 antitrypsin: 164  04/29/15 BMP: 140/4.2/102/24/16/1.01/93/9 LFT: 4/6.7/0.7/67/14/13  10/09/14 VBG (on room air): 7.39/37/59  03/24/13 ANA: Negative  RF: <7.0  11/17/15:Follow up Office Visit:  Patient returns today for follow up of mild right base infiltrate after antibiotic treatment. CXR shows that infiltrate has cleared.She states she is better. No fever, when she coughs she produces clear sputum.She is having some allergic rhinitis symptoms that are new.She states she is compliant with Spiriva use, and is using her rescue inhaler 2-3 times daily.    Current outpatient prescriptions:  .  albuterol (PROVENTIL HFA;VENTOLIN HFA) 108 (90 BASE) MCG/ACT inhaler, Inhale 1-2 puffs into the lungs every 4 (four) hours as needed. For shortness of breath., Disp: 1 Inhaler, Rfl: 3 .  albuterol (PROVENTIL) (5 MG/ML) 0.5% nebulizer solution, Take 0.5 mLs (2.5 mg total) by nebulization every 4 (four) hours as needed for wheezing or shortness of breath., Disp: 20 mL, Rfl:  12 .  benzonatate (TESSALON) 100 MG capsule, Take 1 capsule (100 mg total) by mouth 3 (three) times daily as needed for cough.,  Disp: 30 capsule, Rfl: 0 .  ipratropium (ATROVENT) 0.02 % nebulizer solution, Take 2.5 mLs (0.5 mg total) by nebulization every 4 (four) hours as needed for wheezing., Disp: 75 mL, Rfl: 12 .  losartan-hydrochlorothiazide (HYZAAR) 100-12.5 MG per tablet, Take 1 tablet by mouth daily., Disp: , Rfl:  .  meclizine (ANTIVERT) 25 MG tablet, Take 1 tablet (25 mg total) by mouth 3 (three) times daily as needed., Disp: 30 tablet, Rfl: 0 .  metFORMIN (GLUCOPHAGE) 500 MG tablet, Take 500 mg by mouth 2 (two) times daily with a meal. , Disp: , Rfl:  .  ranitidine (ZANTAC) 150 MG tablet, Take 150 mg by mouth 2 (two) times daily. Reported on 11/03/2015, Disp: , Rfl:  .  tiotropium (SPIRIVA) 18 MCG inhalation capsule, Place 1 capsule (18 mcg total) into inhaler and inhale daily., Disp: 30 capsule, Rfl: 0   Past Medical History  Diagnosis Date  . Diabetes mellitus   . Hypertension   . COPD (chronic obstructive pulmonary disease) ([place])   . Chronic pain   . Anxiety   . Tracheobronchitis 01/01/2012  . High cholesterol   . OSA (obstructive sleep apnea)   . H/O: pneumonia     Allergies  Allergen Reactions  . Aspartame And Phenylalanine Nausea And Vomiting    Patient says she is allergic to all artificial sweeteners  . Doxycycline     Nausea, vomiting, HA, double vision  . Tramadol Shortness Of Breath and Nausea Only  . Codeine Itching  . Sulfa Antibiotics Other (See Comments)    Kidney problem   . Penicillins Rash    Has patient had a PCN reaction causing immediate rash, facial/tongue/throat swelling, SOB or lightheadedness with hypotension: No Has patient had a PCN reaction causing severe rash involving mucus membranes or skin necrosis: No Has patient had a PCN reaction that required hospitalization No Has patient had a PCN reaction occurring within the last 10 years: No If all of the above answers are "NO", then may proceed with Cephalosporin use.  . Prednisone Rash    Patient stated she received  Prednisone while she was in the hospital and experienced a rash and "extreme pain.'    Review of Systems Constitutional:   No  weight loss, night sweats,  Fevers, chills, fatigue, or  lassitude.  HEENT:   No headaches,  Difficulty swallowing,  Tooth/dental problems, or  Sore throat,                No sneezing, itching, ear ache,+ nasal congestion, +post nasal drip,   CV:  No chest pain,  Orthopnea, PND, swelling in lower extremities, anasarca, dizziness, palpitations, syncope.   GI  No heartburn, indigestion, abdominal pain, nausea, vomiting, diarrhea, change in bowel habits, loss of appetite, bloody stools.   Resp:[place] shortness of breath with exertion not  at rest.  No excess mucus, no productive cough,  + non-productive cough,  No coughing up of blood.  No change in color of mucus.  No wheezing.  No chest wall deformity  Skin: no rash or lesions.  GU: no dysuria, change in color of urine, no urgency or frequency.  No flank pain, no hematuria   MS:  + joint pain or swelling.  No decreased range of motion.  No back pain.  Psych:  No change in mood or affect. No depression or  anxiety.  No memory loss.        Objective:   Physical Exam  BP 142/86 mmHg  Pulse 73  Ht 5\' 3"  (1.6 m)  Wt 167 lb (75.751 kg)  BMI 29.59 kg/m2  SpO2 95%  Physical Exam:  General- No distress,  A&Ox3 ENT: No sinus tenderness, TM clear, edematous nasal mucosa, no oral exudate,+ post nasal drip, no LAN Cardiac: S1, S2, regular rate and rhythm, no murmur Chest: No wheeze/ rales/ dullness; no accessory muscle use, no nasal flaring, no sternal retractions Abd.: Soft Non-tender Ext: No clubbing cyanosis, edema Neuro:  normal strength Skin: No rashes, warm and dry Psych: normal mood and behavior     Assessment & Plan:

## 2015-11-17 NOTE — Assessment & Plan Note (Signed)
CXR today indicates resolution of right lung base infiltrate.  Plan: Contact the office for any fever, increase in sputum, worsening shortness of breath

## 2015-11-17 NOTE — Assessment & Plan Note (Signed)
Patient continues to smoke  Plan: Counseled to quit smoking. Reviewed the risks it poses to her health. Pt. Verbalized understanding, and does not want to set a quit date.

## 2015-11-17 NOTE — Patient Instructions (Addendum)
Your chest x ray looks better today. The pneumonia has cleared Lets start Claritin or Zyrtec once daily for your allergies. This is over the counter. You can add flonase if needed.This is  over the counter. Follow the manufacturers dosage directions. You can try some probiotics to see if this will help with your diarrhea.This is also over the counter. Follow up with GI . Your appointment is scheduled to see Dr. Henrene Pastor 01/04/16 at 3 pm. Get established with [facility] 12/20/15. Try to quit smoking. This is the single most powerful action you can take to improve your overall health. Please consider using a CPAP machine for your obstructive sleep apnea.. You would benefit clinically from the treatment, and would feel less tired during the day. Follow up with Dr. Ashok Cordia as is already scheduled for April 28th, 2017 for Pulmonary Function Tests and appointment. Please contact office for sooner follow up if symptoms do not improve or worsen or seek emergency care

## 2015-11-17 NOTE — Assessment & Plan Note (Signed)
Continues to complain of fatigue, and does not sleep well. Asked her to reconsider using a CPAP machine  Plan: Discussed the use of CPAP as a viable option for improved sleep. Discussed the risks associated with untreated sleep apnea again, putting herself at risk for high blood pressure, stroke, heart failure, irregular heart beat and heart attack. She understands she is putting herself at risk. Assess for oral appliance next appointment.

## 2015-11-17 NOTE — Assessment & Plan Note (Signed)
  Plan: Lets start Claritin or Zyrtec once daily for your allergies. This is over the counter. You can add flonase if needed. This is over the counter. Nasal saline as needed. Follow the manufacturers dosage directions.

## 2015-12-12 ENCOUNTER — Inpatient Hospital Stay (HOSPITAL_COMMUNITY)
Admission: EM | Admit: 2015-12-12 | Discharge: 2015-12-18 | DRG: 190 | Disposition: A | Discharge status: Home / Self Care | Payer: Medicare Other | Attending: Internal Medicine | Admitting: Internal Medicine

## 2015-12-12 ENCOUNTER — Emergency Department (HOSPITAL_COMMUNITY): Payer: Medicare Other

## 2015-12-12 ENCOUNTER — Encounter (HOSPITAL_COMMUNITY): Payer: Self-pay

## 2015-12-12 DIAGNOSIS — E860 Dehydration: Secondary | ICD-10-CM | POA: Diagnosis present

## 2015-12-12 DIAGNOSIS — F17200 Nicotine dependence, unspecified, uncomplicated: Secondary | ICD-10-CM | POA: Diagnosis not present

## 2015-12-12 DIAGNOSIS — K219 Gastro-esophageal reflux disease without esophagitis: Secondary | ICD-10-CM | POA: Diagnosis present

## 2015-12-12 DIAGNOSIS — G8929 Other chronic pain: Secondary | ICD-10-CM | POA: Diagnosis present

## 2015-12-12 DIAGNOSIS — Z888 Allergy status to other drugs, medicaments and biological substances status: Secondary | ICD-10-CM

## 2015-12-12 DIAGNOSIS — J9601 Acute respiratory failure with hypoxia: Secondary | ICD-10-CM | POA: Diagnosis present

## 2015-12-12 DIAGNOSIS — R000 Tachycardia, unspecified: Secondary | ICD-10-CM | POA: Diagnosis present

## 2015-12-12 DIAGNOSIS — R509 Fever, unspecified: Secondary | ICD-10-CM | POA: Diagnosis not present

## 2015-12-12 DIAGNOSIS — Z880 Allergy status to penicillin: Secondary | ICD-10-CM

## 2015-12-12 DIAGNOSIS — E119 Type 2 diabetes mellitus without complications: Secondary | ICD-10-CM

## 2015-12-12 DIAGNOSIS — E1165 Type 2 diabetes mellitus with hyperglycemia: Secondary | ICD-10-CM | POA: Diagnosis present

## 2015-12-12 DIAGNOSIS — Z7984 Long term (current) use of oral hypoglycemic drugs: Secondary | ICD-10-CM

## 2015-12-12 DIAGNOSIS — Z882 Allergy status to sulfonamides status: Secondary | ICD-10-CM | POA: Diagnosis not present

## 2015-12-12 DIAGNOSIS — R05 Cough: Secondary | ICD-10-CM | POA: Diagnosis not present

## 2015-12-12 DIAGNOSIS — Z833 Family history of diabetes mellitus: Secondary | ICD-10-CM

## 2015-12-12 DIAGNOSIS — Y92009 Unspecified place in unspecified non-institutional (private) residence as the place of occurrence of the external cause: Secondary | ICD-10-CM | POA: Diagnosis not present

## 2015-12-12 DIAGNOSIS — R0602 Shortness of breath: Secondary | ICD-10-CM | POA: Diagnosis not present

## 2015-12-12 DIAGNOSIS — J479 Bronchiectasis, uncomplicated: Secondary | ICD-10-CM | POA: Diagnosis present

## 2015-12-12 DIAGNOSIS — R531 Weakness: Secondary | ICD-10-CM | POA: Diagnosis not present

## 2015-12-12 DIAGNOSIS — I10 Essential (primary) hypertension: Secondary | ICD-10-CM | POA: Diagnosis present

## 2015-12-12 DIAGNOSIS — W1839XA Other fall on same level, initial encounter: Secondary | ICD-10-CM | POA: Diagnosis present

## 2015-12-12 DIAGNOSIS — Z8249 Family history of ischemic heart disease and other diseases of the circulatory system: Secondary | ICD-10-CM

## 2015-12-12 DIAGNOSIS — Z79899 Other long term (current) drug therapy: Secondary | ICD-10-CM | POA: Diagnosis not present

## 2015-12-12 DIAGNOSIS — E7800 Pure hypercholesterolemia, unspecified: Secondary | ICD-10-CM | POA: Diagnosis present

## 2015-12-12 DIAGNOSIS — J441 Chronic obstructive pulmonary disease with (acute) exacerbation: Secondary | ICD-10-CM | POA: Diagnosis not present

## 2015-12-12 DIAGNOSIS — N179 Acute kidney failure, unspecified: Secondary | ICD-10-CM | POA: Diagnosis present

## 2015-12-12 DIAGNOSIS — G4733 Obstructive sleep apnea (adult) (pediatric): Secondary | ICD-10-CM | POA: Diagnosis present

## 2015-12-12 DIAGNOSIS — F17210 Nicotine dependence, cigarettes, uncomplicated: Secondary | ICD-10-CM | POA: Diagnosis not present

## 2015-12-12 DIAGNOSIS — J9621 Acute and chronic respiratory failure with hypoxia: Secondary | ICD-10-CM | POA: Diagnosis not present

## 2015-12-12 DIAGNOSIS — Z8701 Personal history of pneumonia (recurrent): Secondary | ICD-10-CM | POA: Diagnosis not present

## 2015-12-12 DIAGNOSIS — R259 Unspecified abnormal involuntary movements: Secondary | ICD-10-CM | POA: Diagnosis not present

## 2015-12-12 LAB — CBC
HEMATOCRIT: 45.3 % (ref 36.0–46.0)
HEMOGLOBIN: 15.5 g/dL — AB (ref 12.0–15.0)
MCH: 29.3 pg (ref 26.0–34.0)
MCHC: 34.2 g/dL (ref 30.0–36.0)
MCV: 85.6 fL (ref 78.0–100.0)
PLATELETS: 150 10*3/uL (ref 150–400)
RBC: 5.29 MIL/uL — AB (ref 3.87–5.11)
RDW: 14.3 % (ref 11.5–15.5)
WBC: 7.1 10*3/uL (ref 4.0–10.5)

## 2015-12-12 LAB — BASIC METABOLIC PANEL
Anion gap: 10 (ref 5–15)
BUN: 14 mg/dL (ref 6–20)
CHLORIDE: 105 mmol/L (ref 101–111)
CO2: 25 mmol/L (ref 22–32)
CREATININE: 1.05 mg/dL — AB (ref 0.44–1.00)
Calcium: 8.9 mg/dL (ref 8.9–10.3)
GFR calc non Af Amer: 53 mL/min — ABNORMAL LOW (ref >60)
Glucose, Bld: 127 mg/dL — ABNORMAL HIGH (ref 65–99)
POTASSIUM: 4.1 mmol/L (ref 3.5–5.1)
SODIUM: 140 mmol/L (ref 135–145)

## 2015-12-12 LAB — BLOOD GAS, VENOUS
ACID-BASE DEFICIT: 0.4 mmol/L (ref 0.0–2.0)
Bicarbonate: 25.7 mEq/L — ABNORMAL HIGH (ref 20.0–24.0)
O2 Saturation: 79.4 %
PCO2 VEN: 49.7 mmHg (ref 45.0–50.0)
PH VEN: 7.333 — AB (ref 7.250–7.300)
PO2 VEN: 46.9 mmHg — AB (ref 31.0–45.0)
Patient temperature: 98.6
TCO2: 22.9 mmol/L (ref 0–100)

## 2015-12-12 LAB — INFLUENZA PANEL BY PCR (TYPE A & B)
H1N1FLUPCR: NOT DETECTED
Influenza A By PCR: NEGATIVE
Influenza B By PCR: NEGATIVE

## 2015-12-12 MED ORDER — AZITHROMYCIN 500 MG IV SOLR: 500.0000 mg | Freq: Once | INTRAVENOUS | Status: DC

## 2015-12-12 MED ORDER — IPRATROPIUM-ALBUTEROL 0.5-2.5 (3) MG/3ML IN SOLN
3.0000 mL | RESPIRATORY_TRACT | Status: DC
  Administered 2015-12-12: 3 mL via RESPIRATORY_TRACT

## 2015-12-12 MED ORDER — OXYCODONE HCL 5 MG PO TABS
5.0000 mg | ORAL_TABLET | Freq: Four times a day (QID) | ORAL | Status: DC | PRN
  Administered 2015-12-12 – 2015-12-18 (×12): 5 mg via ORAL
  Filled 2015-12-12 (×13): qty 1

## 2015-12-12 MED ORDER — HYDROCHLOROTHIAZIDE 12.5 MG PO CAPS
12.5000 mg | ORAL_CAPSULE | Freq: Every day | ORAL | Status: DC
  Administered 2015-12-13: 12.5 mg via ORAL
  Filled 2015-12-12 (×2): qty 1

## 2015-12-12 MED ORDER — ACETAMINOPHEN 325 MG PO TABS
650.0000 mg | ORAL_TABLET | Freq: Four times a day (QID) | ORAL | Status: DC | PRN
  Administered 2015-12-12 – 2015-12-13 (×2): 650 mg via ORAL
  Filled 2015-12-12 (×2): qty 2

## 2015-12-12 MED ORDER — BENZONATATE 100 MG PO CAPS
100.0000 mg | ORAL_CAPSULE | Freq: Three times a day (TID) | ORAL | Status: DC | PRN
  Administered 2015-12-12 – 2015-12-13 (×3): 100 mg via ORAL
  Filled 2015-12-12 (×3): qty 1

## 2015-12-12 MED ORDER — METHYLPREDNISOLONE SODIUM SUCC 125 MG IJ SOLR
60.0000 mg | Freq: Every day | INTRAMUSCULAR | Status: DC
  Filled 2015-12-12: qty 0.96

## 2015-12-12 MED ORDER — DEXTROSE 5 % IV SOLN
500.0000 mg | INTRAVENOUS | Status: DC
  Filled 2015-12-12: qty 500

## 2015-12-12 MED ORDER — ONDANSETRON HCL 4 MG PO TABS: 4.0000 mg | ORAL_TABLET | Freq: Four times a day (QID) | ORAL | Status: DC | PRN

## 2015-12-12 MED ORDER — ONDANSETRON HCL 4 MG/2ML IJ SOLN
4.0000 mg | Freq: Four times a day (QID) | INTRAMUSCULAR | Status: DC | PRN
  Administered 2015-12-18: 4 mg via INTRAVENOUS
  Filled 2015-12-12: qty 2

## 2015-12-12 MED ORDER — IPRATROPIUM-ALBUTEROL 0.5-2.5 (3) MG/3ML IN SOLN
3.0000 mL | Freq: Four times a day (QID) | RESPIRATORY_TRACT | Status: DC
  Administered 2015-12-12 – 2015-12-14 (×6): 3 mL via RESPIRATORY_TRACT
  Filled 2015-12-12 (×6): qty 3

## 2015-12-12 MED ORDER — DEXTROSE 5 % IV SOLN
500.0000 mg | INTRAVENOUS | Status: DC
  Administered 2015-12-13 – 2015-12-16 (×4): 500 mg via INTRAVENOUS
  Filled 2015-12-12 (×5): qty 500

## 2015-12-12 MED ORDER — ACETAMINOPHEN 650 MG RE SUPP: 650.0000 mg | Freq: Four times a day (QID) | RECTAL | Status: DC | PRN

## 2015-12-12 MED ORDER — METHYLPREDNISOLONE SODIUM SUCC 125 MG IJ SOLR
125.0000 mg | Freq: Once | INTRAMUSCULAR | Status: AC
  Administered 2015-12-12: 125 mg via INTRAVENOUS
  Filled 2015-12-12: qty 2

## 2015-12-12 MED ORDER — LOSARTAN POTASSIUM-HCTZ 100-12.5 MG PO TABS: 1.0000 | ORAL_TABLET | Freq: Every day | ORAL | Status: DC

## 2015-12-12 MED ORDER — DEXTROSE 5 % IV SOLN
500.0000 mg | Freq: Once | INTRAVENOUS | Status: AC
  Administered 2015-12-12: 500 mg via INTRAVENOUS
  Filled 2015-12-12: qty 500

## 2015-12-12 MED ORDER — ZOLPIDEM TARTRATE 5 MG PO TABS
5.0000 mg | ORAL_TABLET | Freq: Every evening | ORAL | Status: DC | PRN
  Filled 2015-12-12: qty 1

## 2015-12-12 MED ORDER — ALBUTEROL SULFATE (2.5 MG/3ML) 0.083% IN NEBU
5.0000 mg | INHALATION_SOLUTION | Freq: Once | RESPIRATORY_TRACT | Status: AC
  Administered 2015-12-12: 5 mg via RESPIRATORY_TRACT
  Filled 2015-12-12: qty 6

## 2015-12-12 MED ORDER — METFORMIN HCL 500 MG PO TABS
500.0000 mg | ORAL_TABLET | Freq: Two times a day (BID) | ORAL | Status: DC
  Administered 2015-12-12: 500 mg via ORAL
  Filled 2015-12-12 (×4): qty 1

## 2015-12-12 MED ORDER — SODIUM CHLORIDE 0.9 % IV SOLN
INTRAVENOUS | Status: DC
  Administered 2015-12-12 (×2): via INTRAVENOUS
  Administered 2015-12-13: 1000 mL via INTRAVENOUS
  Administered 2015-12-14: 22:00:00 via INTRAVENOUS
  Administered 2015-12-14: 1000 mL via INTRAVENOUS

## 2015-12-12 MED ORDER — ENOXAPARIN SODIUM 40 MG/0.4ML ~~LOC~~ SOLN
40.0000 mg | SUBCUTANEOUS | Status: DC
  Administered 2015-12-12 – 2015-12-17 (×6): 40 mg via SUBCUTANEOUS
  Filled 2015-12-12 (×7): qty 0.4

## 2015-12-12 MED ORDER — ALUM & MAG HYDROXIDE-SIMETH 200-200-20 MG/5ML PO SUSP
30.0000 mL | Freq: Four times a day (QID) | ORAL | Status: DC | PRN
  Filled 2015-12-12: qty 30

## 2015-12-12 MED ORDER — POLYETHYLENE GLYCOL 3350 17 G PO PACK: 17.0000 g | PACK | Freq: Every day | ORAL | Status: DC | PRN

## 2015-12-12 MED ORDER — LOSARTAN POTASSIUM 50 MG PO TABS
100.0000 mg | ORAL_TABLET | Freq: Every day | ORAL | Status: DC
  Administered 2015-12-13: 100 mg via ORAL
  Filled 2015-12-12 (×2): qty 2

## 2015-12-12 MED ORDER — ALBUTEROL (5 MG/ML) CONTINUOUS INHALATION SOLN
10.0000 mg/h | INHALATION_SOLUTION | RESPIRATORY_TRACT | Status: DC
  Administered 2015-12-12: 10 mg/h via RESPIRATORY_TRACT
  Filled 2015-12-12: qty 20

## 2015-12-12 NOTE — ED Notes (Signed)
Bed: WA03 Expected date:  Expected time:  Means of arrival:  Comments: EMS 70F fall, weakness

## 2015-12-12 NOTE — Progress Notes (Signed)
Patient complaining of side pain, due to excessive coughing associated with previous pneumonia and falls *2 today.  Paged MD on call, orders recieved

## 2015-12-12 NOTE — H&P (Signed)
[facility]  History and Physical Victoria Bird L. Victoria Perfect, MD Pager [phone] (if 7P to 7A, page night hospitalist on amion.Victoria Bird JAS:[id number] DOB: [birth year] DOA: 12/12/2015  Referring physician: ED  PCP: Linden Dolin, PA-C   Chief Complaint: fall, weakness   HPI:  Pt presents after last night she had a fall while leaning over for her shoes and another fall while heading to the bathroom . She denies LOC or hitting her head. She just feels weak . She 'does not eat much' but does not note decreased appetite or PO intake . Duration of symptoms is under 24 hours . She states possibly slight fever today but no body aches and really does not complain of any dyspnea , despite being in the 70s on pulse ox when EMS arrived .   In the ED she was given solumedrol , azithromycin , continuous neb . CXR showed NACPD . She was sating 90% on 4L during my exam . She has tachycardia from the neb treatment . Afebrile  Chart Review:  ED notes   Review of Systems:  Falls, weakness   Past Medical History  Diagnosis Date  . Diabetes mellitus   . Hypertension   . COPD (chronic obstructive pulmonary disease) ([place])   . Chronic pain   . Anxiety   . Tracheobronchitis 01/01/2012  . High cholesterol   . OSA (obstructive sleep apnea)   . H/O: pneumonia     Past Surgical History  Procedure Laterality Date  . Appendectomy    . Cholecystectomy    . Abdominal hysterectomy    . Fracture surgery    . Rotator cuff repair    . Carpal tunnel release Left     Social History:  reports that she has been smoking Cigarettes.  She started smoking about 41 years ago. She has a 20.5 pack-year smoking history. She has never used smokeless tobacco. She reports that she does not drink alcohol or use illicit drugs.  Allergies  Allergen Reactions  . Aspartame And Phenylalanine Nausea And Vomiting    Patient says she is allergic to all artificial sweeteners  . Doxycycline Other (See Comments)     Nausea, vomiting, HA, double vision  . Tramadol Shortness Of Breath and Nausea Only  . Codeine Itching    Has not tried benadryl to alleviate side effects  . Sulfa Antibiotics Other (See Comments)    Kidney problem   . Penicillins Rash    Has patient had a PCN reaction causing immediate rash, facial/tongue/throat swelling, SOB or lightheadedness with hypotension: No Has patient had a PCN reaction causing severe rash involving mucus membranes or skin necrosis: No Has patient had a PCN reaction that required hospitalization No Has patient had a PCN reaction occurring within the last 10 years: No If all of the above answers are "NO", then may proceed with Cephalosporin use.  . Prednisone Rash    Patient stated she received Prednisone while she was in the hospital and experienced a rash and "extreme pain.'    Family History  Problem Relation Age of Onset  . Heart attack Mother   . Diabetes Maternal Grandfather   . Thyroid disease Neg Hx   . Lung disease Neg Hx   . Heart attack Maternal Grandfather   . Heart attack Maternal Grandmother   . Hypertension Maternal Grandmother   . Hypertension Maternal Grandfather   . Stroke Maternal Grandfather      Prior to Admission medications   Medication Sig  Start Date End Date Taking? Authorizing Provider  albuterol (PROVENTIL HFA;VENTOLIN HFA) 108 (90 BASE) MCG/ACT inhaler Inhale 1-2 puffs into the lungs every 4 (four) hours as needed. For shortness of breath. 05/27/15  Yes Javier Glazier, MD  albuterol (PROVENTIL) (5 MG/ML) 0.5% nebulizer solution Take 0.5 mLs (2.5 mg total) by nebulization every 4 (four) hours as needed for wheezing or shortness of breath. 04/12/13  Yes Robbie Lis, MD  benzonatate (TESSALON) 100 MG capsule Take 1 capsule (100 mg total) by mouth 3 (three) times daily as needed for cough. 11/03/15  Yes Magdalen Spatz, NP  ipratropium (ATROVENT) 0.02 % nebulizer solution Take 2.5 mLs (0.5 mg total) by nebulization every 4 (four)  hours as needed for wheezing. 04/12/13  Yes Robbie Lis, MD  losartan-hydrochlorothiazide (HYZAAR) 100-12.5 MG per tablet Take 1 tablet by mouth daily.   Yes Historical Provider, MD  meclizine (ANTIVERT) 25 MG tablet Take 1 tablet (25 mg total) by mouth 3 (three) times daily as needed. 06/18/15  Yes Shanker Kristeen Mans, MD  metFORMIN (GLUCOPHAGE) 500 MG tablet Take 500 mg by mouth 2 (two) times daily with a meal.    Yes Historical Provider, MD  tiotropium (SPIRIVA) 18 MCG inhalation capsule Place 1 capsule (18 mcg total) into inhaler and inhale daily. 06/18/15  Yes Shanker Kristeen Mans, MD   Physical Exam: Filed Vitals:   12/12/15 0830 12/12/15 0834 12/12/15 0842 12/12/15 0854  BP:      Pulse: 103  102 104  Temp:      Resp: 27  33 17  SpO2: 91% 93% 92% 94%     General:  WF in mild distress , laying on her side   HEENT: dry MM , anicteric   Cardiovascular: sinus tachy w/ no MRG , no peripheral edema  Respiratory: exp wheezes present diffusely but air movement present throughout   Abdomen: soft, NT , ND   Skin: warm , dry  Musculoskeletal: no focal deficits   Psychiatric: no anxiety or depression  Neurologic: no focal deficits   Wt Readings from Last 3 Encounters:  11/17/15 75.751 kg (167 lb)  11/03/15 76.023 kg (167 lb 9.6 oz)  10/20/15 75.388 kg (166 lb 3.2 oz)    Labs on Admission:  Results for Victoria, Bird (MRN [id number]) as of 12/12/2015 09:59  Ref. Range 06/17/2015 03:50 06/17/2015 09:51 07/23/2015 10:04 12/12/2015 07:45 12/12/2015 08:50  Sample type Unknown     VENOUS  pH, Ven Latest Ref Range: 7.250-7.300      7.333 (H)  pCO2, Ven Latest Ref Range: 45.0-50.0 mmHg     49.7  pO2, Ven Latest Ref Range: 31.0-45.0 mmHg     46.9 (H)  Bicarbonate Latest Ref Range: 20.0-24.0 mEq/L     25.7 (H)  TCO2 Latest Ref Range: 0-100 mmol/L     22.9  Acid-base deficit Latest Ref Range: 0.0-2.0 mmol/L     0.4  O2 Saturation Latest Units: %     79.4  Patient temperature Unknown      98.6  Collection site Unknown     VENOUS  Sodium Latest Ref Range: 135-145 mmol/L 140  140 140   Potassium Latest Ref Range: 3.5-5.1 mmol/L 4.0  4.0 4.1   Chloride Latest Ref Range: 101-111 mmol/L 110  105 105   CO2 Latest Ref Range: 22-32 mmol/L '25  29 25   ' BUN Latest Ref Range: 6-20 mg/dL '10  15 14   ' Creatinine Latest Ref Range: 0.44-1.00 mg/dL 0.88  1.03 1.05 (H)   Calcium Latest Ref Range: 8.9-10.3 mg/dL 7.7 (L)  9.1 8.9   EGFR (Non-African Amer.) Latest Ref Range: >60 mL/min >60   53 (L)   EGFR (African American) Latest Ref Range: >60 mL/min >60   >60   Glucose Latest Ref Range: 65-99 mg/dL 98  108 (H) 127 (H)   Anion gap Latest Ref Range: 5-'15  5   10   ' Alkaline Phosphatase Latest Ref Range: 39-117 U/L 53  71    Albumin Latest Ref Range: 3.5-5.2 g/dL 3.2 (L)  3.7    AST Latest Ref Range: 0-37 U/L 17  12    ALT Latest Ref Range: 0-35 U/L 13 (L)  10    Total Protein Latest Ref Range: 6.0-8.3 g/dL 5.9 (L)  6.7    Total Bilirubin Latest Ref Range: 0.2-1.2 mg/dL 0.7  0.7    GFR Latest Ref Range: >60.00 mL/min   56.58 (L)    Troponin I Latest Ref Range: <0.031 ng/mL <0.03 <0.03     Cholesterol Latest Ref Range: 0-200 mg/dL   201 (H)    Triglycerides Latest Ref Range: 0.0-149.0 mg/dL   243.0 (H)    HDL Cholesterol Latest Ref Range: >39.00 mg/dL   30.00 (L)    Direct LDL Latest Units: mg/dL   118.0    VLDL Latest Ref Range: 0.0-40.0 mg/dL   48.6 (H)    Total CHOL/HDL Ratio Unknown   7    NonHDL Unknown   170.82    WBC Latest Ref Range: 4.0-10.5 K/uL 8.7   7.1   RBC Latest Ref Range: 3.87-5.11 MIL/uL 5.12 (H)   5.29 (H)   Hemoglobin Latest Ref Range: 12.0-15.0 g/dL 15.0   15.5 (H)   HCT Latest Ref Range: 36.0-46.0 % 45.2   45.3   MCV Latest Ref Range: 78.0-100.0 fL 88.3   85.6   MCH Latest Ref Range: 26.0-34.0 pg 29.3   29.3   MCHC Latest Ref Range: 30.0-36.0 g/dL 33.2   34.2   RDW Latest Ref Range: 11.5-15.5 % 13.5   14.3   Platelets Latest Ref Range: 150-400 K/uL 156   150    Hemoglobin A1C Latest Ref Range: 4.6-6.5 %   5.7    Microalb, Ur Latest Ref Range: 0.0-1.9 mg/dL   <0.7    MICROALB/CREAT RATIO Latest Ref Range: 0.0-30.0 mg/g   0.9    Creatinine,U Latest Units: mg/dL   78.4      EKG: Independently reviewed.   Active Problems:   * No active hospital problems. *   Assessment/Plan 1. COPD exacerbation - duoneb q4 hours . O2 as needed . Solumedrol 24m IV daily . azith daily for antibiotic coverage. tamiflu empirically until flu swab returns. Admit to med-surg bed 2. tob abuse - counceled on cessation . Nicotine patch not needed b/c only smoking a few cigarettes per day 3. DM - home meds 4. HTN - home meds  5. dehydration - NS at 100cc/hr overnight   Code Status: full  Family Communication: none Disposition Plan/Anticipated LOS: 3-4 days  Time spent: 417minutes  SVelna Hatchet MD  Internal Medicine Pager [phone] [phone] 7PM-7AM, please contact night-coverage at [email or web address], password [email or web address]3/09/2016, 9:47 AM

## 2015-12-12 NOTE — ED Provider Notes (Signed)
CSN: [id number]     Arrival date & time 12/12/15  [id number] History   First MD Initiated Contact with Patient 12/12/15 [phone]     Chief Complaint  Patient presents with  . Shortness of Breath  . Fall     (Consider location/radiation/quality/duration/timing/severity/associated sxs/prior Treatment) HPI   Patient is a 70 year old female with COPD, chronic pain, ongoing tobacco use, diabetes and hypertension. She's  presenting today with 2 falls and weakness. Patient states she did not strike her head. There is no external evidence of trauma. No loss of consciousness. On arrival here patient has hypoxia to 82% on room air. Patient reports that she recently recovered from pneumonia 3 weeks ago.  Patient says she felt her usual state of health yesterday.    Review of patient's chart, she has extensive history of multiple pneumonias.  Past Medical History  Diagnosis Date  . Diabetes mellitus   . Hypertension   . COPD (chronic obstructive pulmonary disease) ([place])   . Chronic pain   . Anxiety   . Tracheobronchitis 01/01/2012  . High cholesterol   . OSA (obstructive sleep apnea)   . H/O: pneumonia    Past Surgical History  Procedure Laterality Date  . Appendectomy    . Cholecystectomy    . Abdominal hysterectomy    . Fracture surgery    . Rotator cuff repair    . Carpal tunnel release Left    Family History  Problem Relation Age of Onset  . Heart attack Mother   . Diabetes Maternal Grandfather   . Thyroid disease Neg Hx   . Lung disease Neg Hx   . Heart attack Maternal Grandfather   . Heart attack Maternal Grandmother   . Hypertension Maternal Grandmother   . Hypertension Maternal Grandfather   . Stroke Maternal Grandfather    Social History  Substance Use Topics  . Smoking status: Current Every Day Smoker -- 0.50 packs/day for 41 years    Types: Cigarettes    Start date: 03/19/1974  . Smokeless tobacco: Never Used     Comment: Peak rate of 1.5ppd  . Alcohol Use: No   OB  History    No data available     Review of Systems  Constitutional: Positive for fatigue. Negative for fever and activity change.  HENT: Negative for congestion.   Eyes: Negative for discharge.  Respiratory: Positive for cough and shortness of breath. Negative for chest tightness.   Cardiovascular: Negative for chest pain.  Gastrointestinal: Negative for abdominal distention.  Genitourinary: Negative for dysuria.  Musculoskeletal: Negative for joint swelling.  Skin: Negative for rash.  Allergic/Immunologic: Negative for immunocompromised state.  Neurological: Negative for seizures.  Psychiatric/Behavioral: Negative for behavioral problems and agitation.      Allergies  Aspartame and phenylalanine; Doxycycline; Tramadol; Codeine; Sulfa antibiotics; Penicillins; and Prednisone  Home Medications   Prior to Admission medications   Medication Sig Start Date End Date Taking? Authorizing Provider  albuterol (PROVENTIL HFA;VENTOLIN HFA) 108 (90 BASE) MCG/ACT inhaler Inhale 1-2 puffs into the lungs every 4 (four) hours as needed. For shortness of breath. 05/27/15  Yes Javier Glazier, MD  albuterol (PROVENTIL) (5 MG/ML) 0.5% nebulizer solution Take 0.5 mLs (2.5 mg total) by nebulization every 4 (four) hours as needed for wheezing or shortness of breath. 04/12/13  Yes Robbie Lis, MD  benzonatate (TESSALON) 100 MG capsule Take 1 capsule (100 mg total) by mouth 3 (three) times daily as needed for cough. 11/03/15  Yes Magdalen Spatz, NP  ipratropium (ATROVENT) 0.02 % nebulizer solution Take 2.5 mLs (0.5 mg total) by nebulization every 4 (four) hours as needed for wheezing. 04/12/13  Yes Robbie Lis, MD  losartan-hydrochlorothiazide (HYZAAR) 100-12.5 MG per tablet Take 1 tablet by mouth daily.   Yes Historical Provider, MD  meclizine (ANTIVERT) 25 MG tablet Take 1 tablet (25 mg total) by mouth 3 (three) times daily as needed. 06/18/15  Yes Shanker Kristeen Mans, MD  metFORMIN (GLUCOPHAGE) 500 MG  tablet Take 500 mg by mouth 2 (two) times daily with a meal.    Yes Historical Provider, MD  tiotropium (SPIRIVA) 18 MCG inhalation capsule Place 1 capsule (18 mcg total) into inhaler and inhale daily. 06/18/15  Yes Shanker Kristeen Mans, MD   BP 113/25 mmHg  Pulse 80  Temp(Src) 98.5 F (36.9 C) (Oral)  Resp 28  SpO2 98% Physical Exam  Constitutional: She appears well-developed and well-nourished.  HENT:  Head: Normocephalic and atraumatic.  Eyes: Conjunctivae are normal. Right eye exhibits no discharge.  Neck: Neck supple.  Cardiovascular: Regular rhythm and normal heart sounds.   No murmur heard. Tachycardia   Pulmonary/Chest: She has wheezes. She has no rales.  Increased WOB  Abdominal: Soft. She exhibits no distension. There is no tenderness.  Musculoskeletal: Normal range of motion. She exhibits no edema.  Neurological: No cranial nerve deficit.  Skin: Skin is warm and dry. No rash noted. She is not diaphoretic.  Psychiatric: She has a normal mood and affect.  Nursing note and vitals reviewed.   ED Course  Procedures (including critical care time) Labs Review Labs Reviewed  BASIC METABOLIC PANEL - Abnormal; Notable for the following:    Glucose, Bld 127 (*)    Creatinine, Ser 1.05 (*)    GFR calc non Af Amer 53 (*)    All other components within normal limits  CBC - Abnormal; Notable for the following:    RBC 5.29 (*)    Hemoglobin 15.5 (*)    All other components within normal limits  BLOOD GAS, VENOUS - Abnormal; Notable for the following:    pH, Ven 7.333 (*)    pO2, Ven 46.9 (*)    Bicarbonate 25.7 (*)    All other components within normal limits  INFLUENZA PANEL BY PCR (TYPE A & B, H1N1)    Imaging Review Dg Chest 2 View  12/12/2015  CLINICAL DATA:  Shortness of breath, cough, weakness, fever EXAM: CHEST  2 VIEW COMPARISON:  11/17/2015 FINDINGS: Lungs are clear.  No pleural effusion or pneumothorax. The heart is normal in size. Degenerative changes of the  visualized thoracolumbar spine. IMPRESSION: No evidence of acute cardiopulmonary disease. Electronically Signed   By: Julian Hy M.D.   On: 12/12/2015 08:41   I have personally reviewed and evaluated these images and lab results as part of my medical decision-making.   EKG Interpretation None      MDM   Final diagnoses:  None    Patient is a 70 year old female with history of advanced COPD, multiple pneumonias in the last year, current smoker presenting today with multiple falls and fatigue. On arrival here patient is hypoxic to 85% on room air. Patient has wheezing extensively and distant lung sounds. After 1 DuoNeb patient has increasing wheezing on the left lung, right lung still sounds distant. Will start continuous nebs. Patient has increased work of breathing. Patient says she is allergic to prednisone because it "hurts my body all over". We'll give IV methylprednisolone given extensive wheezing and  work of breathing. We'll swab for flu as well as get chest x-ray to rule out a pneumonia.  Patient has no external signs of trauma from fall. Patient did not strike her head or have loss consciousness. I think falls likely from hypoxia and confusion.  Patient does not improve significantly with continuous neb would consider BiPAP.  Patient improved with neb. Will Admit for COPD exacerbation.   CRITICAL CARE Performed by: Gardiner Sleeper Total critical care time: 45 minutes Critical care time was exclusive of separately billable procedures and treating other patients. Critical care was necessary to treat or prevent imminent or life-threatening deterioration. Critical care was time spent personally by me on the following activities: development of treatment plan with patient and/or surrogate as well as nursing, discussions with consultants, evaluation of patient's response to treatment, examination of patient, obtaining history from patient or surrogate, ordering and performing  treatments and interventions, ordering and review of laboratory studies, ordering and review of radiographic studies, pulse oximetry and re-evaluation of patient's condition.      Carely Nappier Julio Alm, MD 12/12/15 1549

## 2015-12-12 NOTE — ED Notes (Signed)
When placed on 2l per [place].  Sats drop to 84-89%.  Placed back on NRB

## 2015-12-12 NOTE — ED Notes (Signed)
Per EMS, pt from home.  Pt fell this morning.  Pt was trying to get to the bathroom.  Pt had recently finished antibiotics for pneumonia.  Pt denies head injury.  Pt landed on hip.  Soreness.  Pt alert and oriented.  Initial sats were 81 %.  Up to 90% on 4l Per [place].  Pt does not wear oxygen at home.  Pt here at [facility] and sats at 88%.  Placed on NRB.  Vitals:  142/92, 100, 90% 4l per [place], resp 22, cbg 122

## 2015-12-13 DIAGNOSIS — E119 Type 2 diabetes mellitus without complications: Secondary | ICD-10-CM

## 2015-12-13 DIAGNOSIS — N179 Acute kidney failure, unspecified: Secondary | ICD-10-CM

## 2015-12-13 DIAGNOSIS — J9621 Acute and chronic respiratory failure with hypoxia: Secondary | ICD-10-CM

## 2015-12-13 LAB — BASIC METABOLIC PANEL
Anion gap: 10 (ref 5–15)
BUN: 26 mg/dL — AB (ref 6–20)
CALCIUM: 8.8 mg/dL — AB (ref 8.9–10.3)
CO2: 26 mmol/L (ref 22–32)
CREATININE: 1.22 mg/dL — AB (ref 0.44–1.00)
Chloride: 106 mmol/L (ref 101–111)
GFR, EST AFRICAN AMERICAN: 52 mL/min — AB (ref >60)
GFR, EST NON AFRICAN AMERICAN: 44 mL/min — AB (ref >60)
Glucose, Bld: 135 mg/dL — ABNORMAL HIGH (ref 65–99)
Potassium: 4.5 mmol/L (ref 3.5–5.1)
SODIUM: 142 mmol/L (ref 135–145)

## 2015-12-13 LAB — CBC
HCT: 42.8 % (ref 36.0–46.0)
Hemoglobin: 13.5 g/dL (ref 12.0–15.0)
MCH: 28.4 pg (ref 26.0–34.0)
MCHC: 31.5 g/dL (ref 30.0–36.0)
MCV: 90.1 fL (ref 78.0–100.0)
PLATELETS: 169 10*3/uL (ref 150–400)
RBC: 4.75 MIL/uL (ref 3.87–5.11)
RDW: 14.3 % (ref 11.5–15.5)
WBC: 7.4 10*3/uL (ref 4.0–10.5)

## 2015-12-13 LAB — GLUCOSE, CAPILLARY
GLUCOSE-CAPILLARY: 125 mg/dL — AB (ref 65–99)
GLUCOSE-CAPILLARY: 139 mg/dL — AB (ref 65–99)
GLUCOSE-CAPILLARY: 156 mg/dL — AB (ref 65–99)
GLUCOSE-CAPILLARY: 219 mg/dL — AB (ref 65–99)
Glucose-Capillary: 132 mg/dL — ABNORMAL HIGH (ref 65–99)

## 2015-12-13 MED ORDER — AMLODIPINE BESYLATE 5 MG PO TABS
5.0000 mg | ORAL_TABLET | Freq: Every day | ORAL | Status: DC
  Administered 2015-12-14 – 2015-12-18 (×5): 5 mg via ORAL
  Filled 2015-12-13 (×5): qty 1

## 2015-12-13 MED ORDER — INSULIN ASPART 100 UNIT/ML ~~LOC~~ SOLN: 0.0000 [IU] | Freq: Three times a day (TID) | SUBCUTANEOUS | Status: DC

## 2015-12-13 MED ORDER — HYDRALAZINE HCL 20 MG/ML IJ SOLN: 10.0000 mg | Freq: Three times a day (TID) | INTRAMUSCULAR | Status: DC | PRN

## 2015-12-13 MED ORDER — BUDESONIDE 0.25 MG/2ML IN SUSP
0.2500 mg | Freq: Two times a day (BID) | RESPIRATORY_TRACT | Status: DC
  Administered 2015-12-13 – 2015-12-18 (×11): 0.25 mg via RESPIRATORY_TRACT
  Filled 2015-12-13 (×11): qty 2

## 2015-12-13 MED ORDER — METFORMIN HCL 500 MG PO TABS
500.0000 mg | ORAL_TABLET | Freq: Two times a day (BID) | ORAL | Status: DC
  Administered 2015-12-13 – 2015-12-18 (×11): 500 mg via ORAL
  Filled 2015-12-13 (×13): qty 1

## 2015-12-13 MED ORDER — INSULIN ASPART 100 UNIT/ML ~~LOC~~ SOLN: 0.0000 [IU] | Freq: Every day | SUBCUTANEOUS | Status: DC

## 2015-12-13 MED ORDER — PANTOPRAZOLE SODIUM 40 MG PO TBEC
40.0000 mg | DELAYED_RELEASE_TABLET | Freq: Every day | ORAL | Status: DC
  Administered 2015-12-14 – 2015-12-18 (×5): 40 mg via ORAL
  Filled 2015-12-13 (×7): qty 1

## 2015-12-13 MED ORDER — METHYLPREDNISOLONE SODIUM SUCC 125 MG IJ SOLR
60.0000 mg | Freq: Two times a day (BID) | INTRAMUSCULAR | Status: DC
  Administered 2015-12-13 – 2015-12-15 (×5): 60 mg via INTRAVENOUS
  Filled 2015-12-13 (×7): qty 0.96

## 2015-12-13 MED ORDER — BENZONATATE 100 MG PO CAPS
200.0000 mg | ORAL_CAPSULE | Freq: Three times a day (TID) | ORAL | Status: DC | PRN
  Administered 2015-12-13 – 2015-12-16 (×5): 200 mg via ORAL
  Filled 2015-12-13 (×5): qty 2

## 2015-12-13 NOTE — Evaluation (Addendum)
Physical Therapy Evaluation Patient Details Name: Victoria Bird MRN: [id number] DOB: 1946-01-03 Today's Date: 12/13/2015   History of Present Illness  70 y.o. female with h/o DM, HTN, COPD, anxiety, OSA admitted with fall x 2 and COPD exacerbation (SaO2 in 70s).   Clinical Impression  Pt admitted with above diagnosis. Pt currently with functional limitations due to the deficits listed below (see PT Problem List). Pt ambulated 30' without an assistive device, distance limited by SOB/fatigue. SaO2 81% on RA with walking, 95% on 4L O2 [place] at rest.  Pt will benefit from skilled PT to increase their independence and safety with mobility to allow discharge to the venue listed below.       Follow Up Recommendations No PT follow up    Equipment Recommendations  Other (comment) (4 wheeled RW, oxygen)    Recommendations for Other Services       Precautions / Restrictions Precautions Precautions: Fall Precaution Comments: 2 falls prior to admission, monitor O2 Restrictions Weight Bearing Restrictions: No      Mobility  Bed Mobility Overal bed mobility: Modified Independent             General bed mobility comments: HOB up 30*  Transfers Overall transfer level: Needs assistance Equipment used: None Transfers: Sit to/from Stand Sit to Stand: Min guard         General transfer comment: min/guard for balance, pt reported feeling "funny and a little nauseous" -RN notified  Ambulation/Gait Ambulation/Gait assistance: Min guard Ambulation Distance (Feet): 30 Feet Assistive device: None Gait Pattern/deviations: Decreased stride length;Step-through pattern   Gait velocity interpretation: Below normal speed for age/gender General Gait Details: SaO2 81% on RA walking, 3/4 dyspnea, 95% on 4L O2 [place] at rest  Stairs            Wheelchair Mobility    Modified Rankin (Stroke Patients Only)       Balance Overall balance assessment: History of Falls                                           Pertinent Vitals/Pain Pain Assessment: No/denies pain    Home Living Family/patient expects to be discharged to:: Private residence Living Arrangements: Children Available Help at Discharge: Family;Available 24 hours/day   Home Access: Stairs to enter Entrance Stairs-Rails: Right Entrance Stairs-Number of Steps: 8 Home Layout: One level Home Equipment: Cane - single point Additional Comments: "a couple" falls last year    Prior Function           Comments: independent with bathing/dressing, uses cane PRN esp when going out     Hand Dominance        Extremity/Trunk Assessment   Upper Extremity Assessment: Overall WFL for tasks assessed           Lower Extremity Assessment: Overall WFL for tasks assessed      Cervical / Trunk Assessment: Normal  Communication   Communication: No difficulties  Cognition Arousal/Alertness: Awake/alert Behavior During Therapy: WFL for tasks assessed/performed Overall Cognitive Status: Within Functional Limits for tasks assessed                      General Comments      Exercises        Assessment/Plan    PT Assessment Patient needs continued PT services  PT Diagnosis Difficulty walking;Generalized weakness   PT Problem List  Decreased activity tolerance;Decreased balance;Decreased mobility;Cardiopulmonary status limiting activity  PT Treatment Interventions Gait training;Functional mobility training;Therapeutic activities;Patient/family education;Stair training;Therapeutic exercise;Balance training   PT Goals (Current goals can be found in the Care Plan section) Acute Rehab PT Goals Patient Stated Goal: pt likes to read, watch tv, flower gardening PT Goal Formulation: With patient Time For Goal Achievement: 12/27/15 Potential to Achieve Goals: Good    Frequency Min 3X/week   Barriers to discharge        Co-evaluation               End of Session  Equipment Utilized During Treatment: Gait belt Activity Tolerance: Patient limited by fatigue Patient left: in chair;with call bell/phone within reach Nurse Communication: Mobility status (O2 sats with ambulation)         Time: [id number] PT Time Calculation (min) (ACUTE ONLY): 15 min   Charges:   PT Evaluation $PT Eval Low Complexity: 1 Procedure     PT G Codes:        [name] 12/13/2015, 12:46 PM [phone]

## 2015-12-13 NOTE — Progress Notes (Signed)
[facility] PROGRESS NOTE  Victoria Bird [id number] DOB: 1945-10-31 DOA: 12/12/2015 PCP: Linden Dolin, PA-C  Assessment/Plan: 1-acute resp failure with hypoxia: due to COPD exacerbation and bronchiectasis  -will continue treatment with steroids, PRN nebulizer and antibiotics -will add pulmicort, flutter valve and continue use of tessalon for coughing spells -O2 supplementation (will wean as tolerated) -patient still SOB, poor air moveemnt and diffuse wheezing -will follow clinical response -no fever and neg Flu by PCR  2-essential HTN: -fairly well controlled -will use PRN hydralazine and norvasc while home agents on hold due to AKI  3-diabetes type 2:  -hyperglycemia expected with use of steroids -patient declining insulin due to being a plasma donor -will continue metformin for now -A1C pending  4-GERD: will continue PPI  5- AKI: due to decrease PO intake and continue use of nephrotoxic agents -will discontinue cozaar and HCTZ -will provide IVF's and follow renal function trend      Code Status: Full Family Communication: no family at bedside  Disposition Plan: remains inpatient, continue steroids, antibiotics, flutter valve and pulmicort. Wean oxygen supplementation as tolerated    Consultants:  None   Procedures:  See below for x-ray reports   Antibiotics:  zithromax   HPI/Subjective: Afebrile, still with SOB and wheezing; unable to speak in full sentences and requiring O2 supplementation.   Objective: Filed Vitals:   12/13/15 1007 12/13/15 1339  BP: 113/55 120/70  Pulse: 82 85  Temp: 98.2 F (36.8 C) 98.3 F (36.8 C)  Resp: 20 22    Intake/Output Summary (Last 24 hours) at 12/13/15 1948 Last data filed at 12/13/15 1800  Gross per 24 hour  Intake 3109.58 ml  Output   1250 ml  Net 1859.58 ml   Filed Weights   12/13/15 1245  Weight: 77.52 kg (170 lb 14.4 oz)    Exam:   General:  Afebrile, complaining of SOB and coughing  spells. Unable to speak in full sentences and with positive wheezing  Cardiovascular: no rubs, no gallops, rate controlled   Respiratory: mild tachypnea, no use of accessory muscles; positive exp wheezing and poor air movement   Abdomen: soft, NT, ND, positive BS  Musculoskeletal: no edema or cyanosis   Data Reviewed: Basic Metabolic Panel:  Recent Labs Lab 12/12/15 0745 12/13/15 0410  NA 140 142  K 4.1 4.5  CL 105 106  CO2 25 26  GLUCOSE 127* 135*  BUN 14 26*  CREATININE 1.05* 1.22*  CALCIUM 8.9 8.8*   CBC:  Recent Labs Lab 12/12/15 0745 12/13/15 0410  WBC 7.1 7.4  HGB 15.5* 13.5  HCT 45.3 42.8  MCV 85.6 90.1  PLT 150 169   CBG:  Recent Labs Lab 12/13/15 0817 12/13/15 1146 12/13/15 1724  GLUCAP 125* 219* 156*   Studies: Dg Chest 2 View  12/12/2015  CLINICAL DATA:  Shortness of breath, cough, weakness, fever EXAM: CHEST  2 VIEW COMPARISON:  11/17/2015 FINDINGS: Lungs are clear.  No pleural effusion or pneumothorax. The heart is normal in size. Degenerative changes of the visualized thoracolumbar spine. IMPRESSION: No evidence of acute cardiopulmonary disease. Electronically Signed   By: Julian Hy M.D.   On: 12/12/2015 08:41    Scheduled Meds: . azithromycin  500 mg Intravenous Q24H  . budesonide (PULMICORT) nebulizer solution  0.25 mg Nebulization BID  . enoxaparin (LOVENOX) injection  40 mg Subcutaneous Q24H  . losartan  100 mg Oral Daily   And  . hydrochlorothiazide  12.5 mg Oral Daily  .  ipratropium-albuterol  3 mL Nebulization QID  . metFORMIN  500 mg Oral BID WC  . methylPREDNISolone (SOLU-MEDROL) injection  60 mg Intravenous Q12H   Continuous Infusions: . sodium chloride 75 mL/hr at 12/13/15 1047    Time spent: 35 minutes    Barton Dubois  [facility] Pager [phone]. If 7PM-7AM, please contact night-coverage at [email or web address], password [facility] 12/13/2015, 7:48 PM  LOS: 1 day

## 2015-12-14 DIAGNOSIS — K219 Gastro-esophageal reflux disease without esophagitis: Secondary | ICD-10-CM

## 2015-12-14 DIAGNOSIS — Z720 Tobacco use: Secondary | ICD-10-CM

## 2015-12-14 LAB — URINALYSIS, ROUTINE W REFLEX MICROSCOPIC
BILIRUBIN URINE: NEGATIVE
GLUCOSE, UA: NEGATIVE mg/dL
Hgb urine dipstick: NEGATIVE
KETONES UR: NEGATIVE mg/dL
LEUKOCYTES UA: NEGATIVE
NITRITE: NEGATIVE
PROTEIN: NEGATIVE mg/dL
Specific Gravity, Urine: 1.015 (ref 1.005–1.030)
pH: 5.5 (ref 5.0–8.0)

## 2015-12-14 LAB — BASIC METABOLIC PANEL
Anion gap: 9 (ref 5–15)
BUN: 31 mg/dL — AB (ref 6–20)
CO2: 23 mmol/L (ref 22–32)
Calcium: 8.8 mg/dL — ABNORMAL LOW (ref 8.9–10.3)
Chloride: 108 mmol/L (ref 101–111)
Creatinine, Ser: 1.2 mg/dL — ABNORMAL HIGH (ref 0.44–1.00)
GFR calc Af Amer: 53 mL/min — ABNORMAL LOW (ref >60)
GFR, EST NON AFRICAN AMERICAN: 45 mL/min — AB (ref >60)
GLUCOSE: 150 mg/dL — AB (ref 65–99)
POTASSIUM: 4.6 mmol/L (ref 3.5–5.1)
Sodium: 140 mmol/L (ref 135–145)

## 2015-12-14 LAB — GLUCOSE, CAPILLARY
GLUCOSE-CAPILLARY: 154 mg/dL — AB (ref 65–99)
GLUCOSE-CAPILLARY: 139 mg/dL — AB (ref 65–99)
GLUCOSE-CAPILLARY: 131 mg/dL — AB (ref 65–99)
Glucose-Capillary: 158 mg/dL — ABNORMAL HIGH (ref 65–99)
Glucose-Capillary: 153 mg/dL — ABNORMAL HIGH (ref 65–99)

## 2015-12-14 LAB — HEMOGLOBIN A1C
HEMOGLOBIN A1C: 5.8 % — AB (ref 4.8–5.6)
Mean Plasma Glucose: 120 mg/dL

## 2015-12-14 MED ORDER — CETYLPYRIDINIUM CHLORIDE 0.05 % MT LIQD
7.0000 mL | Freq: Two times a day (BID) | OROMUCOSAL | Status: DC
  Administered 2015-12-14 – 2015-12-17 (×4): 7 mL via OROMUCOSAL

## 2015-12-14 MED ORDER — IPRATROPIUM-ALBUTEROL 0.5-2.5 (3) MG/3ML IN SOLN
3.0000 mL | Freq: Three times a day (TID) | RESPIRATORY_TRACT | Status: DC
  Administered 2015-12-14 – 2015-12-18 (×10): 3 mL via RESPIRATORY_TRACT
  Filled 2015-12-14 (×11): qty 3

## 2015-12-14 NOTE — Progress Notes (Signed)
12/14/15 1220: Entered pt room and found that she had taken her O2 off. Respirations regular and unlabored. Pt requests to walk and see what her O2 sat is walking in hall without O2. Walking in room headed towards door, O2 sat was 87 with this minimal activity. O2 back on at 3 LPM; O2 sat 94 on 3 LPM. Donne Hazel, RN

## 2015-12-14 NOTE — Plan of Care (Signed)
Problem: Education: Goal: Knowledge of [facility] Education information/materials will improve Outcome: Progressing Discussed blood sugar and IV steroid as to why Coke or orange juice not given for that moment. Family present in room as well

## 2015-12-14 NOTE — Progress Notes (Addendum)
[facility] PROGRESS NOTE  Victoria Bird [id number] DOB: [birth year] DOA: 12/12/2015 PCP: Linden Dolin, PA-C  Assessment/Plan: 1-acute resp failure with hypoxia: due to COPD exacerbation and bronchiectasis  -will continue treatment with steroids, PRN nebulizer and antibiotics -will continue pulmicort, flutter valve and continue use of tessalon (dose adjusted for better control) for coughing spells -Continue O2 supplementation (will wean as tolerated) -patient still SOB, tachypneic, requiring oxygen supplementation due to hypoxia on room air and  with diffuse wheezing -will follow clinical response -no fever and neg Flu by PCR  2-essential HTN: -fairly well controlled -will use PRN hydralazine and norvasc while home agents on hold due to AKI  3-diabetes type 2:  -hyperglycemia expected with use of steroids -patient declining insulin due to being a plasma donor -will continue metformin for now -A1C 5.8  4-GERD: will continue PPI  5- AKI: due to decrease PO intake and continue use of nephrotoxic agents -will discontinue cozaar and HCTZ -will provide IVF's, check urinalysis with microscopy and follow renal function trend  -Cr 1.2 on 3/14   6-tobacco abuse: Cessation counseling has been provided. -Nicotine patch has been offer, patient declined.  Code Status: Full Family Communication: no family at bedside  Disposition Plan: remains inpatient, continue steroids, antibiotics, flutter valve and pulmicort. Wean oxygen supplementation as tolerated    Consultants:  None   Procedures:  See below for x-ray reports   Antibiotics:  zithromax   HPI/Subjective: Afebrile, denies nausea, vomiting and abd pain. Reports son pain in her chest with coughing. Still requiring oxygen supplementation and even reports some improvement is still quite tachypneic and with SOB on minimal exertion.  Objective: Filed Vitals:   12/14/15 0635 12/14/15 1458  BP: 127/61 118/57   Pulse: 62 63  Temp: 98 F (36.7 C) 97.9 F (36.6 C)  Resp: 18 18    Intake/Output Summary (Last 24 hours) at 12/14/15 1759 Last data filed at 12/14/15 1459  Gross per 24 hour  Intake   3300 ml  Output   4200 ml  Net   -900 ml   Filed Weights   12/13/15 1245  Weight: 77.52 kg (170 lb 14.4 oz)    Exam:   General:  Afebrile, Still complaining of SOB and coughing spells. Having difficulty speaking in full sentences and with positive wheezing. But overall reports some improvement.  Cardiovascular: no rubs, no gallops, rate controlled   Respiratory: mild tachypnea, no use of accessory muscles; positive exp wheezing and fair air movement   Abdomen: soft, NT, ND, positive BS  Musculoskeletal: no edema or cyanosis   Data Reviewed: Basic Metabolic Panel:  Recent Labs Lab 12/12/15 0745 12/13/15 0410 12/14/15 0416  NA 140 142 140  K 4.1 4.5 4.6  CL 105 106 108  CO2 25 26 23   GLUCOSE 127* 135* 150*  BUN 14 26* 31*  CREATININE 1.05* 1.22* 1.20*  CALCIUM 8.9 8.8* 8.8*   CBC:  Recent Labs Lab 12/12/15 0745 12/13/15 0410  WBC 7.1 7.4  HGB 15.5* 13.5  HCT 45.3 42.8  MCV 85.6 90.1  PLT 150 169   CBG:  Recent Labs Lab 12/13/15 1959 12/13/15 2358 12/14/15 0359 12/14/15 0755 12/14/15 1218  GLUCAP 132* 139* 154* 139* 131*   Studies: No results found.  Scheduled Meds: . amLODipine  5 mg Oral Daily  . antiseptic oral rinse  7 mL Mouth Rinse BID  . azithromycin  500 mg Intravenous Q24H  . budesonide (PULMICORT) nebulizer solution  0.25 mg Nebulization BID  .  enoxaparin (LOVENOX) injection  40 mg Subcutaneous Q24H  . ipratropium-albuterol  3 mL Nebulization TID  . metFORMIN  500 mg Oral BID WC  . methylPREDNISolone (SOLU-MEDROL) injection  60 mg Intravenous Q12H  . pantoprazole  40 mg Oral Q1200   Continuous Infusions: . sodium chloride 1,000 mL (12/14/15 1211)    Time spent: 30 minutes    Barton Dubois  [facility] Pager [phone]. If  7PM-7AM, please contact night-coverage at [email or web address], password [facility] 12/14/2015, 5:59 PM  LOS: 2 days

## 2015-12-14 NOTE — Plan of Care (Signed)
Problem: Skin Integrity: Goal: Risk for impaired skin integrity will decrease Outcome: Progressing Patient commented on her dry, flaky skin and slight itching.  Patients arms, legs and back wiped and moisture cream applied with relief.

## 2015-12-15 DIAGNOSIS — J441 Chronic obstructive pulmonary disease with (acute) exacerbation: Principal | ICD-10-CM

## 2015-12-15 LAB — GLUCOSE, CAPILLARY
GLUCOSE-CAPILLARY: 122 mg/dL — AB (ref 65–99)
GLUCOSE-CAPILLARY: 138 mg/dL — AB (ref 65–99)
Glucose-Capillary: 112 mg/dL — ABNORMAL HIGH (ref 65–99)
Glucose-Capillary: 136 mg/dL — ABNORMAL HIGH (ref 65–99)
Glucose-Capillary: 152 mg/dL — ABNORMAL HIGH (ref 65–99)
Glucose-Capillary: 183 mg/dL — ABNORMAL HIGH (ref 65–99)
Glucose-Capillary: 185 mg/dL — ABNORMAL HIGH (ref 65–99)

## 2015-12-15 LAB — BASIC METABOLIC PANEL
Anion gap: 8 (ref 5–15)
BUN: 27 mg/dL — ABNORMAL HIGH (ref 6–20)
CALCIUM: 8.9 mg/dL (ref 8.9–10.3)
CO2: 25 mmol/L (ref 22–32)
CREATININE: 1.18 mg/dL — AB (ref 0.44–1.00)
Chloride: 108 mmol/L (ref 101–111)
GFR, EST AFRICAN AMERICAN: 54 mL/min — AB (ref >60)
GFR, EST NON AFRICAN AMERICAN: 46 mL/min — AB (ref >60)
Glucose, Bld: 151 mg/dL — ABNORMAL HIGH (ref 65–99)
Potassium: 4.6 mmol/L (ref 3.5–5.1)
SODIUM: 141 mmol/L (ref 135–145)

## 2015-12-15 MED ORDER — DIPHENHYDRAMINE HCL 50 MG/ML IJ SOLN
25.0000 mg | Freq: Once | INTRAMUSCULAR | Status: AC
  Administered 2015-12-15: 25 mg via INTRAVENOUS
  Filled 2015-12-15: qty 1

## 2015-12-15 MED ORDER — LOSARTAN POTASSIUM 50 MG PO TABS
50.0000 mg | ORAL_TABLET | Freq: Every day | ORAL | Status: DC
  Administered 2015-12-15 – 2015-12-18 (×4): 50 mg via ORAL
  Filled 2015-12-15 (×4): qty 1

## 2015-12-15 MED ORDER — METHYLPREDNISOLONE SODIUM SUCC 125 MG IJ SOLR
60.0000 mg | Freq: Four times a day (QID) | INTRAMUSCULAR | Status: DC
  Administered 2015-12-15: 60 mg via INTRAVENOUS
  Filled 2015-12-15 (×8): qty 0.96

## 2015-12-15 NOTE — Care Management Important Message (Signed)
Important Message  Patient Details  Name: Victoria Bird MRN: [id number] Date of Birth: [birth year]   Medicare Important Message Given:  Yes    Camillo Flaming 12/15/2015, 9:01 AMImportant Message  Patient Details  Name: Victoria Bird MRN: [id number] Date of Birth: 1945-10-29   Medicare Important Message Given:  Yes    Camillo Flaming 12/15/2015, 9:00 AM

## 2015-12-15 NOTE — Progress Notes (Signed)
Received order to give pt IV Benadryl and observe if it made her less SOB.  Will continue to monitor.

## 2015-12-15 NOTE — Progress Notes (Signed)
Pt appeared to be less SOB when I reassessed her.  She reported that she felt better, and that she had less swelling in her feet. I notified provider on call.  Pt was afraid to take her 2100 dose of Solumedrol.  I made provider aware, and she instructed me to ask pt is she would be willing to take her 0300 dose and let us premedicate her with IV Benadryl.  Pt agreed.  Will continue to monitor.

## 2015-12-15 NOTE — Progress Notes (Signed)
Pt c/o  increasing SOB, wheezing, and swelling in her face and feet, and alternating throbbing and sharp pain in her lower back. since she received her IV solumedrol about 1500 this pm.   She states she was having these symptoms before the increase this pm, but now it is worse.  Her sats walking with PT after the dose decreased also.  Will notify MD on call.

## 2015-12-15 NOTE — Progress Notes (Addendum)
[facility] PROGRESS NOTE  Victoria Bird [id number] DOB: 29-Oct-1945 DOA: 12/12/2015 PCP: Linden Dolin, PA-C  Assessment/Plan: 1-Acute resp failure with hypoxia: due to COPD exacerbation and bronchiectasis  -will continue treatment with steroids, PRN nebulizer and antibiotics -will continue pulmicort, flutter valve and continue use of tessalon (dose adjusted for better control) for coughing spells -Continue O2 supplementation (will wean as tolerated) -Patient still SOB, tachypneic, requiring oxygen supplementation due to hypoxia on room air and  with diffuse wheezing. Plan to continue Solu-Medrol 60 mg IV every 6 hours with scheduled duo nebs. -Showing slow clinical improvement  2-Essential HTN: -Blood pressures elevated with systolic blood pressures in the 160s today, will restart Cozaar at 50 mg by mouth daily. Continue Norvasc 5 mg by mouth daily -Continue to monitor blood pressures.  3-Diabetes type 2:  -hyperglycemia expected with use of steroids -patient declining insulin due to being a plasma donor -Continue metformin at 500 mg by mouth twice a day -A1C 5.8  4-GERD: will continue PPI  5- AKI: due to decrease PO intake and continue use of nephrotoxic agents -On 12/15/2015 creatinine improved to 1.18 with BUN of 27 after IV fluid resuscitation.  6-Tobacco abuse: Cessation counseling has been provided. -Nicotine patch has been offer, patient declined.  Code Status: Full Family Communication: no family at bedside  Disposition Plan: Remains inpatient, continue steroids, antibiotics, flutter valve and pulmicort. Wean oxygen supplementation as tolerated. Plan to ambulate her today.   Consultants:  None   Procedures:  See below for x-ray reports   Antibiotics:  zithromax   HPI/Subjective: She reports having ongoing cough, sputum production, shortness of breath, little change compared to yesterday  Objective: Filed Vitals:   12/14/15 2150 12/15/15  0542  BP: 147/66 166/69  Pulse: 72 60  Temp: 97.9 F (36.6 C) 98.3 F (36.8 C)  Resp: 18 18    Intake/Output Summary (Last 24 hours) at 12/15/15 1340 Last data filed at 12/15/15 1043  Gross per 24 hour  Intake 2967.5 ml  Output   2450 ml  Net  517.5 ml   Filed Weights   12/13/15 1245  Weight: 77.52 kg (170 lb 14.4 oz)    Exam:   General:  Afebrile, Still complaining of SOB and coughing spells. Having difficulty speaking in full sentences and with positive wheezing.   Cardiovascular: no rubs, no gallops, rate controlled   Respiratory: mild tachypnea, no use of accessory muscles; positive exp wheezing and fair air movement   Abdomen: soft, NT, ND, positive BS  Musculoskeletal: no edema or cyanosis   Data Reviewed: Basic Metabolic Panel:  Recent Labs Lab 12/12/15 0745 12/13/15 0410 12/14/15 0416 12/15/15 0450  NA 140 142 140 141  K 4.1 4.5 4.6 4.6  CL 105 106 108 108  CO2 25 26 23 25   GLUCOSE 127* 135* 150* 151*  BUN 14 26* 31* 27*  CREATININE 1.05* 1.22* 1.20* 1.18*  CALCIUM 8.9 8.8* 8.8* 8.9   CBC:  Recent Labs Lab 12/12/15 0745 12/13/15 0410  WBC 7.1 7.4  HGB 15.5* 13.5  HCT 45.3 42.8  MCV 85.6 90.1  PLT 150 169   CBG:  Recent Labs Lab 12/14/15 1957 12/15/15 0008 12/15/15 0406 12/15/15 0800 12/15/15 1205  GLUCAP 153* 122* 138* 112* 136*   Studies: No results found.  Scheduled Meds: . amLODipine  5 mg Oral Daily  . antiseptic oral rinse  7 mL Mouth Rinse BID  . azithromycin  500 mg Intravenous Q24H  . budesonide (PULMICORT) nebulizer  solution  0.25 mg Nebulization BID  . enoxaparin (LOVENOX) injection  40 mg Subcutaneous Q24H  . ipratropium-albuterol  3 mL Nebulization TID  . losartan  50 mg Oral Daily  . metFORMIN  500 mg Oral BID WC  . methylPREDNISolone (SOLU-MEDROL) injection  60 mg Intravenous Q6H  . pantoprazole  40 mg Oral Q1200   Continuous Infusions:    Time spent: 30 minutes    Kelvin Cellar  [facility] Pager [phone]. If 7PM-7AM, please contact night-coverage at [email or web address], password [facility] 12/15/2015, 1:40 PM  LOS: 3 days

## 2015-12-15 NOTE — Progress Notes (Signed)
Physical Therapy Treatment Patient Details Name: Victoria Bird MRN: [id number] DOB: 11-07-1945 Today's Date: 12/15/2015    History of Present Illness 70 y.o. female with h/o DM, HTN, COPD, anxiety, OSA admitted with fall x 2 and COPD exacerbation (SaO2 in 70s).     PT Comments    Marked improvement in activity tolerance but pt states "I can't walk without oxygen".  Pt ambulated 800' with O2 @ 2L but required cues to slow pace and rest as needed with sats falling to 85% and HR elevating to 91 bpm but recovery to 92% and 75 bpm within 1 min of stopping activity.  Follow Up Recommendations  No PT follow up     Equipment Recommendations  Other (comment)    Recommendations for Other Services       Precautions / Restrictions Precautions Precautions: Fall Precaution Comments: 2 falls prior to admission, monitor O2 Restrictions Weight Bearing Restrictions: No    Mobility  Bed Mobility Overal bed mobility: Modified Independent                Transfers Overall transfer level: Needs assistance Equipment used: None Transfers: Sit to/from Stand Sit to Stand: Supervision         General transfer comment: min cues for use of UEs to self assist  Ambulation/Gait Ambulation/Gait assistance: Min guard Ambulation Distance (Feet): 800 Feet Assistive device: None Gait Pattern/deviations: Step-through pattern;Shuffle;Trunk flexed;Wide base of support Gait velocity: Cues to slow pace.  Pt states, "I always go like I'm in a hurry"   General Gait Details: Cues to slow pace and rest as needed.  Pt on O2@2L    Stairs            Wheelchair Mobility    Modified Rankin (Stroke Patients Only)       Balance Overall balance assessment: Needs assistance Sitting-balance support: No upper extremity supported;Feet supported Sitting balance-Leahy Scale: Good     Standing balance support: No upper extremity supported Standing balance-Leahy Scale: Fair                      Cognition Arousal/Alertness: Awake/alert Behavior During Therapy: WFL for tasks assessed/performed Overall Cognitive Status: Within Functional Limits for tasks assessed                      Exercises      General Comments        Pertinent Vitals/Pain Pain Assessment: No/denies pain    Home Living                      Prior Function            PT Goals (current goals can now be found in the care plan section) Acute Rehab PT Goals Patient Stated Goal: pt likes to read, watch tv, flower gardening PT Goal Formulation: With patient Time For Goal Achievement: 12/27/15 Potential to Achieve Goals: Good Progress towards PT goals: Progressing toward goals    Frequency  Min 3X/week    PT Plan Current plan remains appropriate    Co-evaluation             End of Session Equipment Utilized During Treatment: Gait belt Activity Tolerance: Patient tolerated treatment well Patient left: in bed;with call bell/phone within reach     Time: 1351-1418 PT Time Calculation (min) (ACUTE ONLY): 27 min  Charges:  $Gait Training: 23-37 mins  G Codes:      Nadeen Shipman 2015/12/23, 4:37 PM

## 2015-12-16 DIAGNOSIS — F17200 Nicotine dependence, unspecified, uncomplicated: Secondary | ICD-10-CM

## 2015-12-16 LAB — GLUCOSE, CAPILLARY
GLUCOSE-CAPILLARY: 125 mg/dL — AB (ref 65–99)
GLUCOSE-CAPILLARY: 157 mg/dL — AB (ref 65–99)
Glucose-Capillary: 92 mg/dL (ref 65–99)
Glucose-Capillary: 138 mg/dL — ABNORMAL HIGH (ref 65–99)
Glucose-Capillary: 127 mg/dL — ABNORMAL HIGH (ref 65–99)

## 2015-12-16 MED ORDER — METHYLPREDNISOLONE SODIUM SUCC 40 MG IJ SOLR
40.0000 mg | Freq: Two times a day (BID) | INTRAMUSCULAR | Status: DC
  Administered 2015-12-16 – 2015-12-18 (×4): 40 mg via INTRAVENOUS
  Filled 2015-12-16 (×6): qty 1

## 2015-12-16 MED ORDER — DIPHENHYDRAMINE HCL 50 MG/ML IJ SOLN
25.0000 mg | Freq: Once | INTRAMUSCULAR | Status: DC
  Filled 2015-12-16: qty 1

## 2015-12-16 NOTE — Progress Notes (Signed)
Pt refused to take the Benadryl and the scheduled Solumedrol.  She became emotional, saying that nobody is listening to her, and that she needs twice the dose of Metformin she is getting here, and steroids are making her blood sugar be high.  She is not happy with the care she is receiving because no one is listening to her.  I told her she needed to discuss her concerns with her physician when they round on her today. As a side note, pt does not appear to be as SOB as she was earlier before I gave her the Benadryl.

## 2015-12-16 NOTE — Progress Notes (Signed)
[facility] PROGRESS NOTE  Victoria Bird [id number] DOB: 05/25/1946 DOA: 12/12/2015 PCP: Linden Dolin, PA-C  Assessment/Plan: 1-Acute resp failure with hypoxia: due to COPD exacerbation and bronchiectasis  -will continue treatment with steroids, PRN nebulizer and antibiotics -will continue pulmicort, flutter valve and continue use of tessalon (dose adjusted for better control) for coughing spells -Continue O2 supplementation (will wean as tolerated) -By 12/16/2015 showing clinical improvement. She expressed concerns for continuing IV steroids every 6 hours. We discussed transitioning to oral prednisone on 12/16/2015, however, she states having a prednisone allergy (despite tolerating Solu-Medrol). She requested Solu-Medrol IV twice a day dosing. -Plan for 1 more day of IV steroids. If she continues to show improvement we'll discharge her in a.m. She is refusing oral steroids.   2-Essential HTN: -Blood pressures elevated with systolic blood pressures in the 160s today, will restart Cozaar at 50 mg by mouth daily. Continue Norvasc 5 mg by mouth daily -Continue to monitor blood pressures.  3-Diabetes type 2:  -Blood sugar stable -Continue metformin at 500 mg by mouth twice a day -A1C 5.8  4-GERD: will continue PPI  5- AKI: due to decrease PO intake and continue use of nephrotoxic agents -On 12/15/2015 creatinine improved to 1.18 with BUN of 27 after IV fluid resuscitation.  6-Tobacco abuse: Cessation counseling has been provided. -Nicotine patch has been offer, patient declined.  Code Status: Full Family Communication: no family at bedside  Disposition Plan: Remains inpatient, continue steroids, antibiotics, flutter valve and pulmicort. Wean oxygen supplementation as tolerated. Plan to ambulate her today. Anticipate discharge in the next 24 hours   Consultants:  None   Procedures:  See below for x-ray reports   Antibiotics:  zithromax    HPI/Subjective: Patient expresses concerns for current frequency of IV steroids and wants to go back to BID dosing. Reports prednisone allergy when I discussed transition to oral steroids.   Objective: Filed Vitals:   12/16/15 0514 12/16/15 1357  BP: 152/59 108/48  Pulse: 61 66  Temp: 97.7 F (36.5 C) 98.2 F (36.8 C)  Resp: 20 20    Intake/Output Summary (Last 24 hours) at 12/16/15 1715 Last data filed at 12/16/15 1358  Gross per 24 hour  Intake    120 ml  Output   2800 ml  Net  -2680 ml   Filed Weights   12/13/15 1245  Weight: 77.52 kg (170 lb 14.4 oz)    Exam:   General:  Looks better today, not as dyspneic as she was yesterday.   Cardiovascular: no rubs, no gallops, rate controlled   Respiratory: mild tachypnea, no use of accessory muscles; positive exp wheezing and fair air movement   Abdomen: soft, NT, ND, positive BS  Musculoskeletal: no edema or cyanosis   Data Reviewed: Basic Metabolic Panel:  Recent Labs Lab 12/12/15 0745 12/13/15 0410 12/14/15 0416 12/15/15 0450  NA 140 142 140 141  K 4.1 4.5 4.6 4.6  CL 105 106 108 108  CO2 25 26 23 25   GLUCOSE 127* 135* 150* 151*  BUN 14 26* 31* 27*  CREATININE 1.05* 1.22* 1.20* 1.18*  CALCIUM 8.9 8.8* 8.8* 8.9   CBC:  Recent Labs Lab 12/12/15 0745 12/13/15 0410  WBC 7.1 7.4  HGB 15.5* 13.5  HCT 45.3 42.8  MCV 85.6 90.1  PLT 150 169   CBG:  Recent Labs Lab 12/15/15 2359 12/16/15 0352 12/16/15 0743 12/16/15 1206 12/16/15 1628  GLUCAP 183* 125* 92 138* 127*   Studies: No results found.  Scheduled Meds: . amLODipine  5 mg Oral Daily  . antiseptic oral rinse  7 mL Mouth Rinse BID  . azithromycin  500 mg Intravenous Q24H  . budesonide (PULMICORT) nebulizer solution  0.25 mg Nebulization BID  . diphenhydrAMINE  25 mg Intravenous Once  . enoxaparin (LOVENOX) injection  40 mg Subcutaneous Q24H  . ipratropium-albuterol  3 mL Nebulization TID  . losartan  50 mg Oral Daily  . metFORMIN   500 mg Oral BID WC  . methylPREDNISolone (SOLU-MEDROL) injection  40 mg Intravenous Q12H  . pantoprazole  40 mg Oral Q1200   Continuous Infusions:    Time spent: 25 minutes    Kelvin Cellar  [facility] Pager [phone]. If 7PM-7AM, please contact night-coverage at [email or web address], password [facility] 12/16/2015, 5:15 PM  LOS: 4 days

## 2015-12-17 LAB — GLUCOSE, CAPILLARY
GLUCOSE-CAPILLARY: 157 mg/dL — AB (ref 65–99)
GLUCOSE-CAPILLARY: 131 mg/dL — AB (ref 65–99)
Glucose-Capillary: 136 mg/dL — ABNORMAL HIGH (ref 65–99)
Glucose-Capillary: 185 mg/dL — ABNORMAL HIGH (ref 65–99)

## 2015-12-17 MED ORDER — GUAIFENESIN ER 600 MG PO TB12
1200.0000 mg | ORAL_TABLET | Freq: Two times a day (BID) | ORAL | Status: DC
Start: 2015-12-17 — End: 2015-12-18
  Administered 2015-12-17 – 2015-12-18 (×3): 1200 mg via ORAL
  Filled 2015-12-17 (×4): qty 2

## 2015-12-17 MED ORDER — AZITHROMYCIN 500 MG PO TABS
500.0000 mg | ORAL_TABLET | Freq: Every day | ORAL | Status: DC
  Administered 2015-12-17 – 2015-12-18 (×2): 500 mg via ORAL
  Filled 2015-12-17 (×3): qty 1

## 2015-12-17 NOTE — Progress Notes (Signed)
[facility] PROGRESS NOTE  Victoria Bird [id number] DOB: 08/29/1946 DOA: 12/12/2015 PCP: Linden Dolin, PA-C  Assessment/Plan: 1-Acute resp failure with hypoxia: due to COPD exacerbation and bronchiectasis  -will continue treatment with steroids, PRN nebulizer and antibiotics -will continue pulmicort, flutter valve and continue use of tessalon (dose adjusted for better control) for coughing spells -Continue O2 supplementation (will wean as tolerated) -By 12/16/2015 showing clinical improvement. She expressed concerns for continuing IV steroids every 6 hours. We discussed transitioning to oral prednisone on 12/16/2015, however, she states having a prednisone allergy (despite tolerating Solu-Medrol). She requested Solu-Medrol IV twice a day dosing. -Appears that room air saturations falling to 87% on ambulation. I did speak to her about my concerns for her desaturations and the possible need for home oxygen. She declines home oxygen despite explaining the consequences of hypoxemia. -Plan to keep her one more day on IV steroids, as mentioned above she reports intolerance to prednisone and refuses oral steroids.  2-Essential HTN: -Blood pressures elevated with systolic blood pressures in the 160s today, will restart Cozaar at 50 mg by mouth daily. Continue Norvasc 5 mg by mouth daily -Continue to monitor blood pressures.  3-Diabetes type 2:  -Blood sugar stable -Continue metformin at 500 mg by mouth twice a day -A1C 5.8  4-GERD: will continue PPI  5- AKI: due to decrease PO intake and continue use of nephrotoxic agents -On 12/15/2015 creatinine improved to 1.18 with BUN of 27 after IV fluid resuscitation.  6-Tobacco abuse: Cessation counseling has been provided. -Nicotine patch has been offer, patient declined.  Code Status: Full Family Communication: no family at bedside  Disposition Plan: Anticipate discharge in the next 24 hours home.   Consultants:  None    Procedures:  See below for x-ray reports   Antibiotics:  zithromax   HPI/Subjective: She states feeling better although not quite at her baseline yet. Continues to have some shortness of breath particularly with exertion.  Objective: Filed Vitals:   12/17/15 0513 12/17/15 1418  BP: 174/72 112/48  Pulse: 61 69  Temp: 97.7 F (36.5 C) 97.8 F (36.6 C)  Resp: 18 18    Intake/Output Summary (Last 24 hours) at 12/17/15 1734 Last data filed at 12/17/15 1419  Gross per 24 hour  Intake   1340 ml  Output   2575 ml  Net  -1235 ml   Filed Weights   12/13/15 1245  Weight: 77.52 kg (170 lb 14.4 oz)    Exam:   General:  He continues to do better each day  Cardiovascular: no rubs, no gallops, rate controlled   Respiratory: Continues to have diminished breath tones bilaterally with respiratory wheezes although lung exam improved compared to yesterday's evaluation  Abdomen: soft, NT, ND, positive BS  Musculoskeletal: no edema or cyanosis   Data Reviewed: Basic Metabolic Panel:  Recent Labs Lab 12/12/15 0745 12/13/15 0410 12/14/15 0416 12/15/15 0450  NA 140 142 140 141  K 4.1 4.5 4.6 4.6  CL 105 106 108 108  CO2 25 26 23 25   GLUCOSE 127* 135* 150* 151*  BUN 14 26* 31* 27*  CREATININE 1.05* 1.22* 1.20* 1.18*  CALCIUM 8.9 8.8* 8.8* 8.9   CBC:  Recent Labs Lab 12/12/15 0745 12/13/15 0410  WBC 7.1 7.4  HGB 15.5* 13.5  HCT 45.3 42.8  MCV 85.6 90.1  PLT 150 169   CBG:  Recent Labs Lab 12/16/15 1628 12/16/15 2135 12/17/15 0733 12/17/15 1145 12/17/15 1641  GLUCAP 127* 157* 136* 157*  131*   Studies: No results found.  Scheduled Meds: . amLODipine  5 mg Oral Daily  . antiseptic oral rinse  7 mL Mouth Rinse BID  . azithromycin  500 mg Oral Daily  . budesonide (PULMICORT) nebulizer solution  0.25 mg Nebulization BID  . enoxaparin (LOVENOX) injection  40 mg Subcutaneous Q24H  . guaiFENesin  1,200 mg Oral BID  . ipratropium-albuterol  3 mL  Nebulization TID  . losartan  50 mg Oral Daily  . metFORMIN  500 mg Oral BID WC  . methylPREDNISolone (SOLU-MEDROL) injection  40 mg Intravenous Q12H  . pantoprazole  40 mg Oral Q1200   Continuous Infusions:    Time spent: 25 minutes    Kelvin Cellar  [facility] Pager [phone]. If 7PM-7AM, please contact night-coverage at [email or web address], password [facility] 12/17/2015, 5:34 PM  LOS: 5 days

## 2015-12-17 NOTE — Progress Notes (Signed)
SATURATION QUALIFICATIONS: (This note is used to comply with regulatory documentation for home oxygen)  Patient Saturations on Room Air at Rest = 94%  Patient Saturations on Room Air while Ambulating = 87%  After amb > 250 feet   Patient Saturations on 1 Liters of oxygen while Ambulating = 90%  Please briefly explain why patient needs home oxygen:  Only during extreme activity   Rica Koyanagi  PTA [facility]  Acute  Rehab Pager      [phone]

## 2015-12-17 NOTE — Progress Notes (Signed)
Physical Therapy Treatment Patient Details Name: Victoria Bird MRN: [id number] DOB: [birth year] Today's Date: 12/17/2015    History of Present Illness 70 y.o. female with h/o DM, HTN, COPD, anxiety, OSA admitted with fall x 2 and COPD exacerbation (SaO2 in 70s).     PT Comments    Pt feeling better and eager to go home.  Sitting EOB Indep on RA resting sats at 94%.  Assisted with amb a great distance in hallway on RA lowest sat was 87%.  Quick recovery to 92% with rest and purse lip breathing.  Pt stated, she has no intention to go home on oxygen.    Follow Up Recommendations  No PT follow up     Equipment Recommendations  None recommended by PT    Recommendations for Other Services       Precautions / Restrictions Precautions Precautions: Fall Precaution Comments: 2 falls prior to admission, monitor O2 Restrictions Weight Bearing Restrictions: No    Mobility  Bed Mobility Overal bed mobility: Modified Independent                Transfers Overall transfer level: Modified independent Equipment used: None             General transfer comment: good safety cognition and use of hands to staedy self  Ambulation/Gait Ambulation/Gait assistance: Supervision Ambulation Distance (Feet): 400 Feet Assistive device: None Gait Pattern/deviations: Step-through pattern Gait velocity: WFL   General Gait Details: amb on RA lowest sat 87%.     Stairs            Wheelchair Mobility    Modified Rankin (Stroke Patients Only)       Balance                                    Cognition Arousal/Alertness: Awake/alert Behavior During Therapy: WFL for tasks assessed/performed Overall Cognitive Status: Within Functional Limits for tasks assessed                      Exercises      General Comments        Pertinent Vitals/Pain Pain Assessment: No/denies pain    Home Living                      Prior Function            PT Goals (current goals can now be found in the care plan section) Progress towards PT goals: Progressing toward goals    Frequency  Min 3X/week    PT Plan Current plan remains appropriate    Co-evaluation             End of Session Equipment Utilized During Treatment: Gait belt Activity Tolerance: Patient tolerated treatment well Patient left: in bed;with call bell/phone within reach     Time: 1015-1030 PT Time Calculation (min) (ACUTE ONLY): 15 min  Charges:  $Gait Training: 8-22 mins                    G Codes:      Rica Koyanagi  PTA [facility]  Acute  Rehab Pager      [phone]

## 2015-12-17 NOTE — Progress Notes (Signed)
PHARMACIST - PHYSICIAN COMMUNICATION CONCERNING: Antibiotic IV to Oral Route Change Policy  RECOMMENDATION: This patient is receiving Azithromycin by the intravenous route.  Based on criteria approved by the Pharmacy and Therapeutics Committee, the antibiotic(s) is/are being converted to the equivalent oral dose form(s).   DESCRIPTION: These criteria include:  Patient being treated for a respiratory tract infection, urinary tract infection, cellulitis or clostridium difficile associated diarrhea if on metronidazole  The patient is not neutropenic and does not exhibit a GI malabsorption state  The patient is eating (either orally or via tube) and/or has been taking other orally administered medications for a least 24 hours  The patient is improving clinically and has a Tmax < 100.5  If you have questions about this conversion, please contact the Pharmacy Department  []  ( [phone] )  [place] []  ( [phone] )  [facility] []  ( [phone]  [place] []  ( [phone] )  [facility] [x]  ( [phone] )  [facility]   

## 2015-12-18 DIAGNOSIS — I10 Essential (primary) hypertension: Secondary | ICD-10-CM

## 2015-12-18 LAB — GLUCOSE, CAPILLARY
GLUCOSE-CAPILLARY: 149 mg/dL — AB (ref 65–99)
Glucose-Capillary: 120 mg/dL — ABNORMAL HIGH (ref 65–99)

## 2015-12-18 MED ORDER — ALPRAZOLAM 0.5 MG PO TABS: 0.5000 mg | ORAL_TABLET | Freq: Every evening | ORAL | Status: DC | PRN

## 2015-12-18 MED ORDER — GUAIFENESIN ER 600 MG PO TB12: 1200.0000 mg | ORAL_TABLET | Freq: Two times a day (BID) | ORAL | Status: DC

## 2015-12-18 MED ORDER — TIOTROPIUM BROMIDE MONOHYDRATE 18 MCG IN CAPS: 18.0000 ug | ORAL_CAPSULE | Freq: Every day | RESPIRATORY_TRACT | Status: DC

## 2015-12-18 MED ORDER — MECLIZINE HCL 25 MG PO TABS: 25.0000 mg | ORAL_TABLET | Freq: Three times a day (TID) | ORAL | Status: DC | PRN

## 2015-12-18 MED ORDER — IPRATROPIUM-ALBUTEROL 0.5-2.5 (3) MG/3ML IN SOLN: 3.0000 mL | Freq: Two times a day (BID) | RESPIRATORY_TRACT | Status: DC

## 2015-12-18 NOTE — Care Management Important Message (Signed)
Important Message  Patient Details  Name: Victoria Bird MRN: [id number] Date of Birth: April 28, 1946   Medicare Important Message Given:  Yes    [name], RN 12/18/2015, 12:33 PM

## 2015-12-18 NOTE — Care Management Note (Signed)
Case Management Note  Patient Details  Name: ALVEENA DEUBLER MRN: [id number] Date of Birth: 1946-03-10  Subjective/Objective:    COPD exacerbation                 Action/Plan: Discharge Planning: AVS reviewed:  NCM spoke to pt at bedside. Pt states walking long distances she will get tired or knees will give out. Requesting RW with seat. NCM contacted attending for DME for home. Contacted [facility] DME rep, Merry Proud for RW with seat. Will deliver to room prior to dc. Pt lives at home with dtr, Edmonia Lynch. Pt can afford her medications. States her inhalers are expensive. Will provided pt with patient assistance application with [employer]. Has neb solutions and machine provided by [facility]. Pt states her neb solution is as the same as Rx provided. Pt states she does not want oxygen for home. States she does not have room for tank in her home. Attending aware. NCM explained importance of oxygen at home. Pt declines at this time. Encouraged her to follow up with PCP or Pulmonologist, Dr Milinda Hirschfeld if she wants oxygen. Pt verbalized understanding.    PCP-Dr Pricilla Holm    Expected Discharge Date:  12/18/2015             Expected Discharge Plan:  Home/Self Care  In-House Referral:  NA  Discharge planning Services  CM Consult  Post Acute Care Choice:  NA Choice offered to:  NA  DME Arranged:  N/A DME Agency:  NA  HH Arranged:  NA HH Agency:  NA  Status of Service:  Completed, signed off  Medicare Important Message Given:  Yes Date Medicare IM Given:    Medicare IM give by:    Date Additional Medicare IM Given:    Additional Medicare Important Message give by:     If discussed at [place] of Stay Meetings, dates discussed:    Additional Comments:  [name], RN 12/18/2015, 12:34 PM

## 2015-12-18 NOTE — Progress Notes (Signed)
Discussed with patient discharged instructions, she verbalized agreement and understanding.  Patient's IV was discontinued with no complications.  Patient to go down in wheelchair with all belongings to go home in private vehicle.

## 2015-12-18 NOTE — Discharge Summary (Signed)
Physician Discharge Summary  Victoria Bird [id number] DOB: 08-02-1946 DOA: 12/12/2015  PCP: Linden Dolin, PA-C  Admit date: 12/12/2015 Discharge date: 12/18/2015  Time spent: 35 minutes  Recommendations for Outpatient Follow-up:  1. Please follow up on Victoria Bird's respiratory status, she was treated for COPD exacerbation 2. I had offered home health services prior to discharge, she declined these services. 3. I offered home oxygen which she unfortunately declined as well. She de-satted during this hospitalization with ambulating > 250 feet.    Discharge Diagnoses:  Principal Problem:   COPD exacerbation ([place]) Active Problems:   Diabetes mellitus ([place])   Hypertension   Tobacco use disorder   Discharge Condition: Stable  Diet recommendation: Heart Healthy  Filed Weights   12/13/15 1245  Weight: 77.52 kg (170 lb 14.4 oz)    History of present illness:  Pt presents after last night she had a fall while leaning over for her shoes and another fall while heading to the bathroom . She denies LOC or hitting her head. She just feels weak . She 'does not eat much' but does not note decreased appetite or PO intake . Duration of symptoms is under 24 hours . She states possibly slight fever today but no body aches and really does not complain of any dyspnea , despite being in the 70s on pulse ox when EMS arrived .   Hospital Course:  1-Acute resp failure with hypoxia: due to COPD exacerbation and bronchiectasis  -will continue treatment with steroids, PRN nebulizer and antibiotics -will continue pulmicort, flutter valve and continue use of tessalon (dose adjusted for better control) for coughing spells -By 12/16/2015 showing clinical improvement. She expressed concerns for continuing IV steroids every 6 hours. We discussed transitioning to oral prednisone on 12/16/2015, however, she states having a prednisone allergy (despite tolerating Solu-Medrol). She requested Solu-Medrol IV  twice a day dosing. -Appears that room air saturations falling to 87% on ambulation >250 feet. I did speak to her about my concerns for her desaturations and the possible need for home oxygen. She declines home oxygen despite explaining the consequences of hypoxemia. -Given clinical stability she was discharged home on 12/18/2015  2-Essential HTN: -Blood pressures stable, was continued on Hyzaar 100/12.5  3-Diabetes type 2:  -Blood sugar stable -Continue metformin at 500 mg by mouth twice a day -A1C 5.8  4-GERD: will continue PPI  5- AKI: due to decrease PO intake and continue use of nephrotoxic agents -On 12/15/2015 creatinine improved to 1.18 with BUN of 27 after IV fluid resuscitation   Consultations:  Physical Therapy  Discharge Exam: Filed Vitals:   12/17/15 2119 12/18/15 0524  BP: 141/64 121/64  Pulse: 65 64  Temp: 97.7 F (36.5 C) 98.7 F (37.1 C)  Resp: 18 19     General: Breathing comfortably on room air. Looks better overall   Cardiovascular: no rubs, no gallops, rate controlled   Respiratory: Improved lung sounds, decreased rhonchi and wheezes.   Abdomen: soft, NT, ND, positive BS  Musculoskeletal: no edema or cyanosis  Discharge Instructions   Discharge Instructions    Call MD for:  difficulty breathing, headache or visual disturbances    Complete by:  As directed      Call MD for:  extreme fatigue    Complete by:  As directed      Call MD for:  hives    Complete by:  As directed      Call MD for:  persistant dizziness or light-headedness  Complete by:  As directed      Call MD for:  persistant nausea and vomiting    Complete by:  As directed      Call MD for:  redness, tenderness, or signs of infection (pain, swelling, redness, odor or green/yellow discharge around incision site)    Complete by:  As directed      Call MD for:  severe uncontrolled pain    Complete by:  As directed      Call MD for:  temperature >100.4    Complete by:  As  directed      Call MD for:    Complete by:  As directed      Diet - low sodium heart healthy    Complete by:  As directed      Increase activity slowly    Complete by:  As directed           Current Discharge Medication List    START taking these medications   Details  ALPRAZolam (XANAX) 0.5 MG tablet Take 1 tablet (0.5 mg total) by mouth at bedtime as needed for anxiety. Qty: 30 tablet, Refills: 0    guaiFENesin (MUCINEX) 600 MG 12 hr tablet Take 2 tablets (1,200 mg total) by mouth 2 (two) times daily. Qty: 60 tablet, Refills: 0    ipratropium-albuterol (DUONEB) 0.5-2.5 (3) MG/3ML SOLN Take 3 mLs by nebulization 2 (two) times daily. Qty: 360 mL, Refills: 1      CONTINUE these medications which have CHANGED   Details  meclizine (ANTIVERT) 25 MG tablet Take 1 tablet (25 mg total) by mouth 3 (three) times daily as needed. Qty: 30 tablet, Refills: 0    tiotropium (SPIRIVA) 18 MCG inhalation capsule Place 1 capsule (18 mcg total) into inhaler and inhale daily. Qty: 30 capsule, Refills: 0      CONTINUE these medications which have NOT CHANGED   Details  albuterol (PROVENTIL HFA;VENTOLIN HFA) 108 (90 BASE) MCG/ACT inhaler Inhale 1-2 puffs into the lungs every 4 (four) hours as needed. For shortness of breath. Qty: 1 Inhaler, Refills: 3    benzonatate (TESSALON) 100 MG capsule Take 1 capsule (100 mg total) by mouth 3 (three) times daily as needed for cough. Qty: 30 capsule, Refills: 0    ipratropium (ATROVENT) 0.02 % nebulizer solution Take 2.5 mLs (0.5 mg total) by nebulization every 4 (four) hours as needed for wheezing. Qty: 75 mL, Refills: 12    losartan-hydrochlorothiazide (HYZAAR) 100-12.5 MG per tablet Take 1 tablet by mouth daily.    metFORMIN (GLUCOPHAGE) 500 MG tablet Take 500 mg by mouth 2 (two) times daily with a meal.       STOP taking these medications     albuterol (PROVENTIL) (5 MG/ML) 0.5% nebulizer solution        Allergies  Allergen Reactions  .  Aspartame And Phenylalanine Nausea And Vomiting    Patient says she is allergic to all artificial sweeteners  . Doxycycline Other (See Comments)    Nausea, vomiting, HA, double vision  . Tramadol Shortness Of Breath and Nausea Only  . Codeine Itching    Has not tried benadryl to alleviate side effects  . Sulfa Antibiotics Other (See Comments)    Kidney problem   . Penicillins Rash    Has patient had a PCN reaction causing immediate rash, facial/tongue/throat swelling, SOB or lightheadedness with hypotension: No Has patient had a PCN reaction causing severe rash involving mucus membranes or skin necrosis: No Has patient had  a PCN reaction that required hospitalization No Has patient had a PCN reaction occurring within the last 10 years: No If all of the above answers are "NO", then may proceed with Cephalosporin use.  . Prednisone Rash    Patient stated she received Prednisone while she was in the hospital and experienced a rash and "extreme pain.'   Follow-up Information    Follow up with Hoyt Koch, MD In 1 week.   Specialty:  Internal Medicine   Contact information:   [place] [id number] [phone]        The results of significant diagnostics from this hospitalization (including imaging, microbiology, ancillary and laboratory) are listed below for reference.    Significant Diagnostic Studies: Dg Chest 2 View  12/12/2015  CLINICAL DATA:  Shortness of breath, cough, weakness, fever EXAM: CHEST  2 VIEW COMPARISON:  11/17/2015 FINDINGS: Lungs are clear.  No pleural effusion or pneumothorax. The heart is normal in size. Degenerative changes of the visualized thoracolumbar spine. IMPRESSION: No evidence of acute cardiopulmonary disease. Electronically Signed   By: Julian Hy M.D.   On: 12/12/2015 08:41    Microbiology: No results found for this or any previous visit (from the past 240 hour(s)).   Labs: Basic Metabolic Panel:  Recent  Labs Lab 12/12/15 0745 12/13/15 0410 12/14/15 0416 12/15/15 0450  NA 140 142 140 141  K 4.1 4.5 4.6 4.6  CL 105 106 108 108  CO2 25 26 23 25   GLUCOSE 127* 135* 150* 151*  BUN 14 26* 31* 27*  CREATININE 1.05* 1.22* 1.20* 1.18*  CALCIUM 8.9 8.8* 8.8* 8.9   Liver Function Tests: No results for input(s): AST, ALT, ALKPHOS, BILITOT, PROT, ALBUMIN in the last 168 hours. No results for input(s): LIPASE, AMYLASE in the last 168 hours. No results for input(s): AMMONIA in the last 168 hours. CBC:  Recent Labs Lab 12/12/15 0745 12/13/15 0410  WBC 7.1 7.4  HGB 15.5* 13.5  HCT 45.3 42.8  MCV 85.6 90.1  PLT 150 169   Cardiac Enzymes: No results for input(s): CKTOTAL, CKMB, CKMBINDEX, TROPONINI in the last 168 hours. BNP: BNP (last 3 results) No results for input(s): BNP in the last 8760 hours.  ProBNP (last 3 results) No results for input(s): PROBNP in the last 8760 hours.  CBG:  Recent Labs Lab 12/17/15 1145 12/17/15 1641 12/17/15 2125 12/18/15 0133 12/18/15 0738  GLUCAP 157* 131* 185* 120* 149*       Signed:  Kelvin Cellar MD.  [facility] 12/18/2015, 11:21 AM

## 2015-12-20 ENCOUNTER — Ambulatory Visit: Payer: Medicare Other | Admitting: Internal Medicine

## 2015-12-24 ENCOUNTER — Inpatient Hospital Stay (HOSPITAL_COMMUNITY)
Admission: EM | Admit: 2015-12-24 | Discharge: 2015-12-31 | DRG: 392 | Disposition: A | Discharge status: Home / Self Care | Payer: Medicare Other | Attending: Internal Medicine | Admitting: Internal Medicine

## 2015-12-24 ENCOUNTER — Encounter (HOSPITAL_COMMUNITY): Payer: Self-pay

## 2015-12-24 DIAGNOSIS — R109 Unspecified abdominal pain: Secondary | ICD-10-CM | POA: Diagnosis not present

## 2015-12-24 DIAGNOSIS — I10 Essential (primary) hypertension: Secondary | ICD-10-CM | POA: Diagnosis not present

## 2015-12-24 DIAGNOSIS — Z86010 Personal history of colonic polyps: Secondary | ICD-10-CM

## 2015-12-24 DIAGNOSIS — Z79899 Other long term (current) drug therapy: Secondary | ICD-10-CM

## 2015-12-24 DIAGNOSIS — E119 Type 2 diabetes mellitus without complications: Secondary | ICD-10-CM

## 2015-12-24 DIAGNOSIS — Z880 Allergy status to penicillin: Secondary | ICD-10-CM

## 2015-12-24 DIAGNOSIS — G8929 Other chronic pain: Secondary | ICD-10-CM | POA: Diagnosis present

## 2015-12-24 DIAGNOSIS — K5732 Diverticulitis of large intestine without perforation or abscess without bleeding: Secondary | ICD-10-CM | POA: Diagnosis not present

## 2015-12-24 DIAGNOSIS — F17210 Nicotine dependence, cigarettes, uncomplicated: Secondary | ICD-10-CM | POA: Diagnosis present

## 2015-12-24 DIAGNOSIS — I952 Hypotension due to drugs: Secondary | ICD-10-CM | POA: Diagnosis not present

## 2015-12-24 DIAGNOSIS — Z7951 Long term (current) use of inhaled steroids: Secondary | ICD-10-CM

## 2015-12-24 DIAGNOSIS — J449 Chronic obstructive pulmonary disease, unspecified: Secondary | ICD-10-CM | POA: Diagnosis present

## 2015-12-24 DIAGNOSIS — Z8249 Family history of ischemic heart disease and other diseases of the circulatory system: Secondary | ICD-10-CM

## 2015-12-24 DIAGNOSIS — Z833 Family history of diabetes mellitus: Secondary | ICD-10-CM

## 2015-12-24 DIAGNOSIS — F17200 Nicotine dependence, unspecified, uncomplicated: Secondary | ICD-10-CM | POA: Diagnosis present

## 2015-12-24 DIAGNOSIS — Z7984 Long term (current) use of oral hypoglycemic drugs: Secondary | ICD-10-CM

## 2015-12-24 DIAGNOSIS — Z888 Allergy status to other drugs, medicaments and biological substances status: Secondary | ICD-10-CM

## 2015-12-24 DIAGNOSIS — K5900 Constipation, unspecified: Secondary | ICD-10-CM | POA: Diagnosis present

## 2015-12-24 DIAGNOSIS — G4733 Obstructive sleep apnea (adult) (pediatric): Secondary | ICD-10-CM | POA: Diagnosis present

## 2015-12-24 DIAGNOSIS — Z823 Family history of stroke: Secondary | ICD-10-CM

## 2015-12-24 DIAGNOSIS — Z882 Allergy status to sulfonamides status: Secondary | ICD-10-CM

## 2015-12-24 DIAGNOSIS — E785 Hyperlipidemia, unspecified: Secondary | ICD-10-CM | POA: Diagnosis present

## 2015-12-24 DIAGNOSIS — K219 Gastro-esophageal reflux disease without esophagitis: Secondary | ICD-10-CM | POA: Diagnosis present

## 2015-12-24 DIAGNOSIS — K529 Noninfective gastroenteritis and colitis, unspecified: Secondary | ICD-10-CM | POA: Diagnosis present

## 2015-12-24 DIAGNOSIS — R1032 Left lower quadrant pain: Secondary | ICD-10-CM | POA: Diagnosis not present

## 2015-12-24 DIAGNOSIS — R52 Pain, unspecified: Secondary | ICD-10-CM

## 2015-12-24 DIAGNOSIS — Z881 Allergy status to other antibiotic agents status: Secondary | ICD-10-CM

## 2015-12-24 LAB — URINALYSIS, ROUTINE W REFLEX MICROSCOPIC
BILIRUBIN URINE: NEGATIVE
Glucose, UA: NEGATIVE mg/dL
HGB URINE DIPSTICK: NEGATIVE
KETONES UR: NEGATIVE mg/dL
Nitrite: NEGATIVE
PROTEIN: NEGATIVE mg/dL
Specific Gravity, Urine: 1.013 (ref 1.005–1.030)
pH: 5 (ref 5.0–8.0)

## 2015-12-24 LAB — COMPREHENSIVE METABOLIC PANEL
ALBUMIN: 3.7 g/dL (ref 3.5–5.0)
ALK PHOS: 69 U/L (ref 38–126)
ALT: 43 U/L (ref 14–54)
AST: 28 U/L (ref 15–41)
Anion gap: 10 (ref 5–15)
BILIRUBIN TOTAL: 1.1 mg/dL (ref 0.3–1.2)
BUN: 17 mg/dL (ref 6–20)
CALCIUM: 9.1 mg/dL (ref 8.9–10.3)
CO2: 29 mmol/L (ref 22–32)
Chloride: 102 mmol/L (ref 101–111)
Creatinine, Ser: 1.27 mg/dL — ABNORMAL HIGH (ref 0.44–1.00)
GFR calc Af Amer: 49 mL/min — ABNORMAL LOW (ref >60)
GFR calc non Af Amer: 42 mL/min — ABNORMAL LOW (ref >60)
GLUCOSE: 141 mg/dL — AB (ref 65–99)
POTASSIUM: 4.3 mmol/L (ref 3.5–5.1)
Sodium: 141 mmol/L (ref 135–145)
TOTAL PROTEIN: 7.4 g/dL (ref 6.5–8.1)

## 2015-12-24 LAB — URINE MICROSCOPIC-ADD ON

## 2015-12-24 LAB — CBC
HEMATOCRIT: 45.4 % (ref 36.0–46.0)
Hemoglobin: 14.7 g/dL (ref 12.0–15.0)
MCH: 28.4 pg (ref 26.0–34.0)
MCHC: 32.4 g/dL (ref 30.0–36.0)
MCV: 87.6 fL (ref 78.0–100.0)
PLATELETS: 211 10*3/uL (ref 150–400)
RBC: 5.18 MIL/uL — ABNORMAL HIGH (ref 3.87–5.11)
RDW: 14.1 % (ref 11.5–15.5)
WBC: 11.6 10*3/uL — ABNORMAL HIGH (ref 4.0–10.5)

## 2015-12-24 LAB — LIPASE, BLOOD: Lipase: 34 U/L (ref 11–51)

## 2015-12-24 MED ORDER — HYDROMORPHONE HCL 1 MG/ML IJ SOLN
1.0000 mg | Freq: Once | INTRAMUSCULAR | Status: AC
  Administered 2015-12-25: 1 mg via INTRAVENOUS
  Filled 2015-12-24: qty 1

## 2015-12-24 MED ORDER — SODIUM CHLORIDE 0.9 % IV BOLUS (SEPSIS)
1000.0000 mL | Freq: Once | INTRAVENOUS | Status: AC
Start: 2015-12-24 — End: 2015-12-25
  Administered 2015-12-25: 1000 mL via INTRAVENOUS

## 2015-12-24 MED ORDER — ONDANSETRON HCL 4 MG/2ML IJ SOLN
4.0000 mg | Freq: Once | INTRAMUSCULAR | Status: AC
  Administered 2015-12-25: 4 mg via INTRAVENOUS
  Filled 2015-12-24: qty 2

## 2015-12-24 NOTE — ED Notes (Signed)
MD at bedside. 

## 2015-12-24 NOTE — ED Provider Notes (Signed)
CSN: [id number]     Arrival date & time 12/24/15  2131 History  By signing my name below, I, [name], attest that this documentation has been prepared under the direction and in the presence of Virgel Manifold, MD. Electronically Signed: Virgel Bouquet, ED Scribe. 12/25/2015. 2:46 AM.   Chief Complaint  Patient presents with  . Abdominal Pain    LOWER X1 WEEK  . Fatigue    X1 WEEK   The history is provided by the patient. No language interpreter was used.   HPI Comments: Victoria Bird is a 70 y.o. female with an hx of DM, COPD, chronic pain, GERD, appendectomy, and cholecystectomy who presents to the Emergency Department complaining of moderate, waxing and waning abdominal pain, improved after a BM. Patient reports that she was released from the hospital 1 week ago and has diarrhea since that time that occurs 4-5 times a day. She endorses associated nausea, back pain, BUE pain, fatigue, intermittent fevers, and night diaphoresis. She states that she received antibiotics, steroids in the hospital BID. She took Xanax once yesterday without relief. Denies SOB, wheezing, vomiting.  Past Medical History  Diagnosis Date  . Diabetes mellitus   . Hypertension   . COPD (chronic obstructive pulmonary disease) ([place])   . Chronic pain   . Anxiety   . Tracheobronchitis 01/01/2012  . High cholesterol   . OSA (obstructive sleep apnea)   . H/O: pneumonia   . GERD (gastroesophageal reflux disease)   . Colon polyps     adenomatous   Past Surgical History  Procedure Laterality Date  . Appendectomy    . Cholecystectomy    . Abdominal hysterectomy    . Fracture surgery    . Rotator cuff repair    . Carpal tunnel release Left    Family History  Problem Relation Age of Onset  . Heart attack Mother   . Diabetes Maternal Grandfather   . Thyroid disease Neg Hx   . Lung disease Neg Hx   . Heart attack Maternal Grandfather   . Heart attack Maternal Grandmother   . Hypertension  Maternal Grandmother   . Hypertension Maternal Grandfather   . Stroke Maternal Grandfather    Social History  Substance Use Topics  . Smoking status: Current Every Day Smoker -- 0.50 packs/day for 41 years    Types: Cigarettes    Start date: 03/19/1974  . Smokeless tobacco: Never Used     Comment: Peak rate of 1.5ppd  . Alcohol Use: No   OB History    No data available     Review of Systems  Constitutional: Positive for fever and diaphoresis.  Respiratory: Negative for shortness of breath and wheezing.   Gastrointestinal: Positive for nausea, abdominal pain and diarrhea. Negative for vomiting.  Musculoskeletal: Positive for myalgias (BUE) and back pain.  All other systems reviewed and are negative.     Allergies  Aspartame and phenylalanine; Doxycycline; Tramadol; Codeine; Sulfa antibiotics; Penicillins; and Prednisone  Home Medications   Prior to Admission medications   Medication Sig Start Date End Date Taking? Authorizing Provider  albuterol (PROVENTIL HFA;VENTOLIN HFA) 108 (90 BASE) MCG/ACT inhaler Inhale 1-2 puffs into the lungs every 4 (four) hours as needed. For shortness of breath. 05/27/15  Yes Javier Glazier, MD  ALPRAZolam Duanne Moron) 0.5 MG tablet Take 1 tablet (0.5 mg total) by mouth at bedtime as needed for anxiety. 12/18/15  Yes Kelvin Cellar, MD  benzonatate (TESSALON) 100 MG capsule Take 1 capsule (100 mg  total) by mouth 3 (three) times daily as needed for cough. 11/03/15  Yes Magdalen Spatz, NP  guaiFENesin (MUCINEX) 600 MG 12 hr tablet Take 2 tablets (1,200 mg total) by mouth 2 (two) times daily. 12/18/15  Yes Kelvin Cellar, MD  ipratropium-albuterol (DUONEB) 0.5-2.5 (3) MG/3ML SOLN Take 3 mLs by nebulization 2 (two) times daily. 12/18/15  Yes Kelvin Cellar, MD  losartan-hydrochlorothiazide (HYZAAR) 100-12.5 MG per tablet Take 1 tablet by mouth daily.   Yes Historical Provider, MD  metFORMIN (GLUCOPHAGE) 500 MG tablet Take 500 mg by mouth 2 (two) times daily  with a meal.    Yes Historical Provider, MD  tiotropium (SPIRIVA) 18 MCG inhalation capsule Place 1 capsule (18 mcg total) into inhaler and inhale daily. 12/18/15  Yes Kelvin Cellar, MD  meclizine (ANTIVERT) 25 MG tablet Take 1 tablet (25 mg total) by mouth 3 (three) times daily as needed. Patient not taking: Reported on 12/24/2015 12/18/15   Kelvin Cellar, MD   BP 122/52 mmHg  Pulse 83  Temp(Src) 98.7 F (37.1 C) (Oral)  Resp 16  Ht 5\' 3"  (1.6 m)  Wt 165 lb 9.6 oz (75.116 kg)  BMI 29.34 kg/m2  SpO2 97% Physical Exam  Constitutional: She is oriented to person, place, and time. She appears well-developed and well-nourished. No distress.  Tired appearing.  HENT:  Head: Normocephalic and atraumatic.  Eyes: Conjunctivae and EOM are normal.  Neck: Neck supple. No tracheal deviation present.  Cardiovascular: Normal rate.   Pulmonary/Chest: Effort normal. No respiratory distress.  Abdominal: There is tenderness.  LLQ tenderness.  Musculoskeletal: Normal range of motion.  Neurological: She is alert and oriented to person, place, and time.  Skin: Skin is warm and dry.  Psychiatric: She has a normal mood and affect. Her behavior is normal.  Nursing note and vitals reviewed.   ED Course  Procedures   DIAGNOSTIC STUDIES: Oxygen Saturation is 94% on 2 L/min, adequate by my interpretation.    COORDINATION OF CARE: 11:41 PM Will order IV fluids, Zofran, Dilaudid, and C difficle lab. Discussed treatment plan with pt at bedside and pt agreed to plan.  2:34 AM Returned to recheck on pt and pt has improved slightly. Discussed options with pt and pt and myself agreed to admittance to the hospital. Will consult with case worker about physical therapy and rehabilitation outpatient stay.  Labs Review Labs Reviewed  COMPREHENSIVE METABOLIC PANEL - Abnormal; Notable for the following:    Glucose, Bld 141 (*)    Creatinine, Ser 1.27 (*)    GFR calc non Af Amer 42 (*)    GFR calc Af Amer 49  (*)    All other components within normal limits  CBC - Abnormal; Notable for the following:    WBC 11.6 (*)    RBC 5.18 (*)    All other components within normal limits  URINALYSIS, ROUTINE W REFLEX MICROSCOPIC (NOT AT [facility]) - Abnormal; Notable for the following:    APPearance CLOUDY (*)    Leukocytes, UA TRACE (*)    All other components within normal limits  URINE MICROSCOPIC-ADD ON - Abnormal; Notable for the following:    Squamous Epithelial / LPF 6-30 (*)    Bacteria, UA MANY (*)    All other components within normal limits  GLUCOSE, CAPILLARY - Abnormal; Notable for the following:    Glucose-Capillary 135 (*)    All other components within normal limits  CBC WITH DIFFERENTIAL/PLATELET - Abnormal; Notable for the following:  WBC 11.7 (*)    Neutro Abs 8.8 (*)    Monocytes Absolute 1.2 (*)    All other components within normal limits  COMPREHENSIVE METABOLIC PANEL - Abnormal; Notable for the following:    Glucose, Bld 103 (*)    Creatinine, Ser 1.01 (*)    Calcium 8.2 (*)    Total Protein 5.9 (*)    Albumin 2.8 (*)    GFR calc non Af Amer 56 (*)    All other components within normal limits  GLUCOSE, CAPILLARY - Abnormal; Notable for the following:    Glucose-Capillary 128 (*)    All other components within normal limits  GLUCOSE, CAPILLARY - Abnormal; Notable for the following:    Glucose-Capillary 116 (*)    All other components within normal limits  COMPREHENSIVE METABOLIC PANEL - Abnormal; Notable for the following:    Glucose, Bld 107 (*)    Creatinine, Ser 1.19 (*)    Calcium 8.3 (*)    Total Protein 5.9 (*)    Albumin 2.8 (*)    AST 14 (*)    GFR calc non Af Amer 46 (*)    GFR calc Af Amer 53 (*)    All other components within normal limits  GLUCOSE, CAPILLARY - Abnormal; Notable for the following:    Glucose-Capillary 101 (*)    All other components within normal limits  GLUCOSE, CAPILLARY - Abnormal; Notable for the following:    Glucose-Capillary 115  (*)    All other components within normal limits  GLUCOSE, CAPILLARY - Abnormal; Notable for the following:    Glucose-Capillary 133 (*)    All other components within normal limits  CBC - Abnormal; Notable for the following:    Hemoglobin 11.3 (*)    HCT 34.5 (*)    All other components within normal limits  BASIC METABOLIC PANEL - Abnormal; Notable for the following:    Creatinine, Ser 1.05 (*)    Calcium 8.4 (*)    GFR calc non Af Amer 53 (*)    All other components within normal limits  GLUCOSE, CAPILLARY - Abnormal; Notable for the following:    Glucose-Capillary 147 (*)    All other components within normal limits  URINE CULTURE  LIPASE, BLOOD  GLUCOSE, CAPILLARY  GLUCOSE, CAPILLARY  GLUCOSE, CAPILLARY  CBC WITH DIFFERENTIAL/PLATELET  GLUCOSE, CAPILLARY  GLUCOSE, CAPILLARY    Imaging Review No results found. I have personally reviewed and evaluated these images and lab results as part of my medical decision-making.    MDM   Final diagnoses:  Severe pain  Abdominal pain    cC-year-old female with abdominal pain and generalized weakness after recent hospitalization. Reports ongoing diarrhea. Will check for C. Difficile given recent hospitalization antibiotic usage. Her abdominal exam is fairly benign. Patient does not feel significantly better after treating symptoms the emergency room. Will admit for ongoing treatment and evaluation.  I personally preformed the services scribed in my presence. The recorded information has been reviewed is accurate. Virgel Manifold, MD.   Virgel Manifold, MD 12/28/15 [phone]

## 2015-12-24 NOTE — ED Notes (Signed)
PT C/O LOWER ABDOMINAL PAIN WITH NAUSEA AND DIARRHEA X1 WEEK. PT ALSO C/O GENERALIZED WEAKNESS X1 WEEK. PT STATES SHE WAS TREATED HERE A WEEK AGO FOR KIDNEY PROBLEMS.

## 2015-12-25 ENCOUNTER — Observation Stay (HOSPITAL_COMMUNITY): Payer: Medicare Other

## 2015-12-25 ENCOUNTER — Encounter (HOSPITAL_COMMUNITY): Payer: Self-pay | Admitting: Internal Medicine

## 2015-12-25 DIAGNOSIS — Z888 Allergy status to other drugs, medicaments and biological substances status: Secondary | ICD-10-CM | POA: Diagnosis not present

## 2015-12-25 DIAGNOSIS — K219 Gastro-esophageal reflux disease without esophagitis: Secondary | ICD-10-CM | POA: Diagnosis present

## 2015-12-25 DIAGNOSIS — Z880 Allergy status to penicillin: Secondary | ICD-10-CM | POA: Diagnosis not present

## 2015-12-25 DIAGNOSIS — Z882 Allergy status to sulfonamides status: Secondary | ICD-10-CM | POA: Diagnosis not present

## 2015-12-25 DIAGNOSIS — G8929 Other chronic pain: Secondary | ICD-10-CM | POA: Diagnosis present

## 2015-12-25 DIAGNOSIS — Z7951 Long term (current) use of inhaled steroids: Secondary | ICD-10-CM | POA: Diagnosis not present

## 2015-12-25 DIAGNOSIS — Z86010 Personal history of colonic polyps: Secondary | ICD-10-CM | POA: Diagnosis not present

## 2015-12-25 DIAGNOSIS — R1084 Generalized abdominal pain: Secondary | ICD-10-CM | POA: Diagnosis not present

## 2015-12-25 DIAGNOSIS — Z79899 Other long term (current) drug therapy: Secondary | ICD-10-CM | POA: Diagnosis not present

## 2015-12-25 DIAGNOSIS — J449 Chronic obstructive pulmonary disease, unspecified: Secondary | ICD-10-CM | POA: Diagnosis present

## 2015-12-25 DIAGNOSIS — F17200 Nicotine dependence, unspecified, uncomplicated: Secondary | ICD-10-CM | POA: Diagnosis not present

## 2015-12-25 DIAGNOSIS — R109 Unspecified abdominal pain: Secondary | ICD-10-CM | POA: Diagnosis present

## 2015-12-25 DIAGNOSIS — F17210 Nicotine dependence, cigarettes, uncomplicated: Secondary | ICD-10-CM | POA: Diagnosis present

## 2015-12-25 DIAGNOSIS — Z833 Family history of diabetes mellitus: Secondary | ICD-10-CM | POA: Diagnosis not present

## 2015-12-25 DIAGNOSIS — E0800 Diabetes mellitus due to underlying condition with hyperosmolarity without nonketotic hyperglycemic-hyperosmolar coma (NKHHC): Secondary | ICD-10-CM | POA: Diagnosis not present

## 2015-12-25 DIAGNOSIS — Z881 Allergy status to other antibiotic agents status: Secondary | ICD-10-CM | POA: Diagnosis not present

## 2015-12-25 DIAGNOSIS — I10 Essential (primary) hypertension: Secondary | ICD-10-CM | POA: Diagnosis present

## 2015-12-25 DIAGNOSIS — K529 Noninfective gastroenteritis and colitis, unspecified: Secondary | ICD-10-CM | POA: Diagnosis present

## 2015-12-25 DIAGNOSIS — E119 Type 2 diabetes mellitus without complications: Secondary | ICD-10-CM | POA: Diagnosis present

## 2015-12-25 DIAGNOSIS — I952 Hypotension due to drugs: Secondary | ICD-10-CM | POA: Diagnosis not present

## 2015-12-25 DIAGNOSIS — K5732 Diverticulitis of large intestine without perforation or abscess without bleeding: Secondary | ICD-10-CM | POA: Diagnosis present

## 2015-12-25 DIAGNOSIS — E785 Hyperlipidemia, unspecified: Secondary | ICD-10-CM | POA: Diagnosis present

## 2015-12-25 DIAGNOSIS — Z823 Family history of stroke: Secondary | ICD-10-CM | POA: Diagnosis not present

## 2015-12-25 DIAGNOSIS — G4733 Obstructive sleep apnea (adult) (pediatric): Secondary | ICD-10-CM | POA: Diagnosis present

## 2015-12-25 DIAGNOSIS — K5900 Constipation, unspecified: Secondary | ICD-10-CM | POA: Diagnosis present

## 2015-12-25 DIAGNOSIS — Z7984 Long term (current) use of oral hypoglycemic drugs: Secondary | ICD-10-CM | POA: Diagnosis not present

## 2015-12-25 DIAGNOSIS — J410 Simple chronic bronchitis: Secondary | ICD-10-CM | POA: Diagnosis not present

## 2015-12-25 DIAGNOSIS — Z8249 Family history of ischemic heart disease and other diseases of the circulatory system: Secondary | ICD-10-CM | POA: Diagnosis not present

## 2015-12-25 LAB — GLUCOSE, CAPILLARY
GLUCOSE-CAPILLARY: 135 mg/dL — AB (ref 65–99)
GLUCOSE-CAPILLARY: 89 mg/dL (ref 65–99)
Glucose-Capillary: 90 mg/dL (ref 65–99)
Glucose-Capillary: 128 mg/dL — ABNORMAL HIGH (ref 65–99)

## 2015-12-25 MED ORDER — BENZONATATE 100 MG PO CAPS: 100.0000 mg | ORAL_CAPSULE | Freq: Three times a day (TID) | ORAL | Status: DC | PRN

## 2015-12-25 MED ORDER — ENOXAPARIN SODIUM 40 MG/0.4ML ~~LOC~~ SOLN
40.0000 mg | SUBCUTANEOUS | Status: DC
  Administered 2015-12-25 – 2015-12-31 (×7): 40 mg via SUBCUTANEOUS
  Filled 2015-12-25 (×7): qty 0.4

## 2015-12-25 MED ORDER — ONDANSETRON HCL 4 MG/2ML IJ SOLN
4.0000 mg | Freq: Four times a day (QID) | INTRAMUSCULAR | Status: DC | PRN
  Administered 2015-12-27 – 2015-12-30 (×3): 4 mg via INTRAVENOUS
  Filled 2015-12-25 (×3): qty 2

## 2015-12-25 MED ORDER — ALPRAZOLAM 0.5 MG PO TABS
0.5000 mg | ORAL_TABLET | Freq: Every evening | ORAL | Status: DC | PRN
  Administered 2015-12-31: 0.5 mg via ORAL
  Filled 2015-12-25 (×2): qty 1

## 2015-12-25 MED ORDER — ONDANSETRON HCL 4 MG PO TABS
4.0000 mg | ORAL_TABLET | Freq: Four times a day (QID) | ORAL | Status: DC | PRN
  Administered 2015-12-28 – 2015-12-30 (×2): 4 mg via ORAL
  Filled 2015-12-25 (×2): qty 1

## 2015-12-25 MED ORDER — GUAIFENESIN ER 600 MG PO TB12
1200.0000 mg | ORAL_TABLET | Freq: Two times a day (BID) | ORAL | Status: DC
  Administered 2015-12-25 – 2015-12-31 (×13): 1200 mg via ORAL
  Filled 2015-12-25 (×15): qty 2

## 2015-12-25 MED ORDER — PANTOPRAZOLE SODIUM 40 MG IV SOLR
40.0000 mg | INTRAVENOUS | Status: DC
  Administered 2015-12-25 – 2015-12-27 (×3): 40 mg via INTRAVENOUS
  Filled 2015-12-25 (×4): qty 40

## 2015-12-25 MED ORDER — LOSARTAN POTASSIUM 50 MG PO TABS
100.0000 mg | ORAL_TABLET | Freq: Every day | ORAL | Status: DC
Start: 2015-12-25 — End: 2015-12-27
  Administered 2015-12-25 – 2015-12-26 (×2): 100 mg via ORAL
  Filled 2015-12-25 (×3): qty 2

## 2015-12-25 MED ORDER — TIOTROPIUM BROMIDE MONOHYDRATE 18 MCG IN CAPS
18.0000 ug | ORAL_CAPSULE | Freq: Every day | RESPIRATORY_TRACT | Status: DC
Start: 2015-12-25 — End: 2015-12-31
  Administered 2015-12-25 – 2015-12-31 (×6): 18 ug via RESPIRATORY_TRACT
  Filled 2015-12-25 (×3): qty 5

## 2015-12-25 MED ORDER — HYDROMORPHONE HCL 1 MG/ML IJ SOLN: 1.0000 mg | INTRAMUSCULAR | Status: DC | PRN

## 2015-12-25 MED ORDER — LOSARTAN POTASSIUM-HCTZ 100-12.5 MG PO TABS
1.0000 | ORAL_TABLET | Freq: Every day | ORAL | Status: DC
Start: 2015-12-25 — End: 2015-12-25

## 2015-12-25 MED ORDER — METRONIDAZOLE IN NACL 5-0.79 MG/ML-% IV SOLN
500.0000 mg | Freq: Three times a day (TID) | INTRAVENOUS | Status: DC
  Administered 2015-12-25 – 2015-12-31 (×19): 500 mg via INTRAVENOUS
  Filled 2015-12-25 (×22): qty 100

## 2015-12-25 MED ORDER — IPRATROPIUM-ALBUTEROL 0.5-2.5 (3) MG/3ML IN SOLN
3.0000 mL | Freq: Two times a day (BID) | RESPIRATORY_TRACT | Status: DC
Start: 2015-12-25 — End: 2015-12-31
  Administered 2015-12-25 – 2015-12-31 (×11): 3 mL via RESPIRATORY_TRACT
  Filled 2015-12-25 (×12): qty 3

## 2015-12-25 MED ORDER — HYDROMORPHONE HCL 1 MG/ML IJ SOLN
0.5000 mg | INTRAMUSCULAR | Status: DC | PRN
  Administered 2015-12-25 – 2015-12-30 (×14): 0.5 mg via INTRAVENOUS
  Filled 2015-12-25 (×14): qty 1

## 2015-12-25 MED ORDER — SODIUM CHLORIDE 0.9 % IV SOLN
INTRAVENOUS | Status: AC
  Administered 2015-12-25 (×2): via INTRAVENOUS

## 2015-12-25 MED ORDER — HYDROCHLOROTHIAZIDE 12.5 MG PO CAPS
12.5000 mg | ORAL_CAPSULE | Freq: Every day | ORAL | Status: DC
  Administered 2015-12-25 – 2015-12-26 (×2): 12.5 mg via ORAL
  Filled 2015-12-25 (×3): qty 1

## 2015-12-25 MED ORDER — CIPROFLOXACIN IN D5W 400 MG/200ML IV SOLN
400.0000 mg | Freq: Two times a day (BID) | INTRAVENOUS | Status: DC
  Administered 2015-12-25 – 2015-12-31 (×13): 400 mg via INTRAVENOUS
  Filled 2015-12-25 (×14): qty 200

## 2015-12-25 MED ORDER — INSULIN ASPART 100 UNIT/ML ~~LOC~~ SOLN
0.0000 [IU] | Freq: Three times a day (TID) | SUBCUTANEOUS | Status: DC
Start: 2015-12-25 — End: 2015-12-31

## 2015-12-25 MED ORDER — METFORMIN HCL 500 MG PO TABS
500.0000 mg | ORAL_TABLET | Freq: Two times a day (BID) | ORAL | Status: DC
Start: 2015-12-25 — End: 2015-12-25
  Filled 2015-12-25 (×3): qty 1

## 2015-12-25 NOTE — Care Management Note (Addendum)
Case Management Note  Patient Details  Name: SOSHA EMSLEY MRN: [id number] Date of Birth: 1945-12-30  Subjective/Objective:                  Abdominal pain  Action/Plan: CM spoke with patient at the bedside. Reports she has no needs for assistance at home. Her daughter lives with her. Agrees to inform her nurse if she has any needs requiring CM assistance.   Expected Discharge Date:  12/27/15               Expected Discharge Plan:  Home/Self Care  In-House Referral:     Discharge planning Services  CM Consult  Post Acute Care Choice:    Choice offered to:     DME Arranged:  N/A DME Agency:  NA  HH Arranged:  NA HH Agency:  NA  Status of Service:  Completed, signed off  Medicare Important Message Given:    Date Medicare IM Given:    Medicare IM give by:    Date Additional Medicare IM Given:    Additional Medicare Important Message give by:     If discussed at [facility] of Stay Meetings, dates discussed:    Additional Comments:  Apolonio Schneiders, RN 12/25/2015, 11:50 AM

## 2015-12-25 NOTE — Care Management Obs Status (Signed)
[place] NOTIFICATION   Patient Details  Name: Victoria Bird MRN: [id number] Date of Birth: 12/18/1945   Medicare Observation Status Notification Given:  Yes  Explained Observation Status to patient. Patient has no questions.   Apolonio Schneiders, RN 12/25/2015, 11:38 AM

## 2015-12-25 NOTE — ED Notes (Signed)
PT instructed on need for stool sample.

## 2015-12-25 NOTE — ED Notes (Signed)
MD notified of pt decreased oxygen saturation. Pt was at 78% on room air and was placed on 2L via [place] and increased up to 93%

## 2015-12-25 NOTE — H&P (Signed)
[facility] History and Physical  Victoria Bird [id number] DOB: 1945-11-12 DOA: 12/24/2015  Referring physician: Virgel Manifold, MD PCP: Hoyt Koch, MD   Chief Complaint: Abdominal pain.  HPI: Victoria Bird is a 70 y.o. female with a past medical history of type 2 diabetes, hypertension, COPD, chronic pain, anxiety, hyperlipidemia, obstructive sleep apnea, GERD who was recently admitted and discharged on 12/18/2015 for COPD exacerbation and returns now to the emergency department with a week history of lower abdominal pain.  Per patient, she has been having generalized weakness, loss of appetite, abdominal pain associated with nausea and multiple episodes of diarrhea daily for about a week. She states that the weakness has gotten so bad that she is unable to walk from her room to her kitchen without significant fatigue and weakness.   When seen in the emergency department, the patient stated that her pain was doing better after she was given analgesics. She was in no acute distress. Workup in the emergency department shows mild leukocytosis, mild elevation in creatinine and mildly abnormal urinalysis.  Review of Systems:  Constitutional:  Positive night sweats, Fevers, chills, fatigue.  HEENT:  No headaches, Difficulty swallowing,Tooth/dental problems,Sore throat. No sneezing, itching, ear ache, nasal congestion, post nasal drip,  Cardio-vascular:  No chest pain, Orthopnea, PND, swelling in lower extremities, anasarca, dizziness, palpitations  GI:  Positive indigestion, abdominal pain, nausea, diarrheal, loss of appetite  No heartburn, vomiting, change in bowel habits,  Resp:  Occasional dyspnea and wheezing. Usually resolved by bronchodilators. Skin:  no rash or lesions.  GU:  no dysuria, change in color of urine, no urgency or frequency. No flank pain.  Musculoskeletal:  Generalized weakness. Positive arthralgias, myalgias and occasional back  pain. Psych:  No change in mood or affect. No depression or anxiety. No memory loss.   Past Medical History  Diagnosis Date  . Diabetes mellitus   . Hypertension   . COPD (chronic obstructive pulmonary disease) ([place])   . Chronic pain   . Anxiety   . Tracheobronchitis 01/01/2012  . High cholesterol   . OSA (obstructive sleep apnea)   . H/O: pneumonia   . GERD (gastroesophageal reflux disease)   . Colon polyps     adenomatous   Past Surgical History  Procedure Laterality Date  . Appendectomy    . Cholecystectomy    . Abdominal hysterectomy    . Fracture surgery    . Rotator cuff repair    . Carpal tunnel release Left    Social History:  reports that she has been smoking Cigarettes.  She started smoking about 41 years ago. She has a 20.5 pack-year smoking history. She has never used smokeless tobacco. She reports that she does not drink alcohol or use illicit drugs.  Allergies  Allergen Reactions  . Aspartame And Phenylalanine Nausea And Vomiting    Patient says she is allergic to all artificial sweeteners  . Doxycycline Other (See Comments)    Nausea, vomiting, HA, double vision  . Tramadol Shortness Of Breath and Nausea Only  . Codeine Itching    Has not tried benadryl to alleviate side effects  . Sulfa Antibiotics Other (See Comments)    Kidney problem   . Penicillins Rash    Has patient had a PCN reaction causing immediate rash, facial/tongue/throat swelling, SOB or lightheadedness with hypotension: No Has patient had a PCN reaction causing severe rash involving mucus membranes or skin necrosis: No Has patient had a PCN reaction that required hospitalization  No Has patient had a PCN reaction occurring within the last 10 years: No If all of the above answers are "NO", then may proceed with Cephalosporin use.  . Prednisone Rash    Patient stated she received Prednisone while she was in the hospital and experienced a rash and "extreme pain.'    Family History  Problem  Relation Age of Onset  . Heart attack Mother   . Diabetes Maternal Grandfather   . Thyroid disease Neg Hx   . Lung disease Neg Hx   . Heart attack Maternal Grandfather   . Heart attack Maternal Grandmother   . Hypertension Maternal Grandmother   . Hypertension Maternal Grandfather   . Stroke Maternal Grandfather     Prior to Admission medications   Medication Sig Start Date End Date Taking? Authorizing Provider  albuterol (PROVENTIL HFA;VENTOLIN HFA) 108 (90 BASE) MCG/ACT inhaler Inhale 1-2 puffs into the lungs every 4 (four) hours as needed. For shortness of breath. 05/27/15  Yes Javier Glazier, MD  ALPRAZolam Duanne Moron) 0.5 MG tablet Take 1 tablet (0.5 mg total) by mouth at bedtime as needed for anxiety. 12/18/15  Yes Kelvin Cellar, MD  benzonatate (TESSALON) 100 MG capsule Take 1 capsule (100 mg total) by mouth 3 (three) times daily as needed for cough. 11/03/15  Yes Magdalen Spatz, NP  guaiFENesin (MUCINEX) 600 MG 12 hr tablet Take 2 tablets (1,200 mg total) by mouth 2 (two) times daily. 12/18/15  Yes Kelvin Cellar, MD  ipratropium-albuterol (DUONEB) 0.5-2.5 (3) MG/3ML SOLN Take 3 mLs by nebulization 2 (two) times daily. 12/18/15  Yes Kelvin Cellar, MD  losartan-hydrochlorothiazide (HYZAAR) 100-12.5 MG per tablet Take 1 tablet by mouth daily.   Yes Historical Provider, MD  metFORMIN (GLUCOPHAGE) 500 MG tablet Take 500 mg by mouth 2 (two) times daily with a meal.    Yes Historical Provider, MD  tiotropium (SPIRIVA) 18 MCG inhalation capsule Place 1 capsule (18 mcg total) into inhaler and inhale daily. 12/18/15  Yes Kelvin Cellar, MD  meclizine (ANTIVERT) 25 MG tablet Take 1 tablet (25 mg total) by mouth 3 (three) times daily as needed. Patient not taking: Reported on 12/24/2015 12/18/15   Kelvin Cellar, MD   Physical Exam: Filed Vitals:   12/24/15 2225 12/25/15 0031 12/25/15 0220  BP: 123/74 127/59 122/52  Pulse: 96 84 83  Temp: 98.7 F (37.1 C)    TempSrc: Oral    Resp: 15 16 16     Height: 5\' 3"  (1.6 m)    Weight: 75.116 kg (165 lb 9.6 oz)    SpO2: 94% 91% 97%    Wt Readings from Last 3 Encounters:  12/24/15 75.116 kg (165 lb 9.6 oz)  12/13/15 77.52 kg (170 lb 14.4 oz)  11/17/15 75.751 kg (167 lb)    General:  Appears calm and comfortable Eyes: PERRL, normal lids, irises & conjunctiva ENT: grossly normal hearing, lips & tongue. Oral mucosa is mildly dry, dentures. Neck: no LAD, masses or thyromegaly Cardiovascular: RRR, no m/r/g. No LE edema. Telemetry: SR, no arrhythmias  Respiratory: CTA bilaterally, no w/r/r. Normal respiratory effort. Abdomen: No distention, bowel sounds +, soft, LLQ tenderness, no guarding, no rebound. Skin: no rash or induration seen on limited exam Musculoskeletal: grossly normal tone BUE/BLE Psychiatric: grossly normal mood and affect, speech fluent and appropriate Neurologic: Awake, alert, oriented 4, grossly non-focal.          Labs on Admission:  Basic Metabolic Panel:  Recent Labs Lab 12/24/15 2242  NA 141  K 4.3  CL 102  CO2 29  GLUCOSE 141*  BUN 17  CREATININE 1.27*  CALCIUM 9.1   Liver Function Tests:  Recent Labs Lab 12/24/15 2242  AST 28  ALT 43  ALKPHOS 69  BILITOT 1.1  PROT 7.4  ALBUMIN 3.7    Recent Labs Lab 12/24/15 2242  LIPASE 34   CBC:  Recent Labs Lab 12/24/15 2242  WBC 11.6*  HGB 14.7  HCT 45.4  MCV 87.6  PLT 211    CBG:  Recent Labs Lab 12/18/15 0738  GLUCAP 149*    Radiological Exams on Admission: No results found.  EKG: Independently reviewed.  Assessment/Plan Principal Problem:   Enteritis Admit to MedSurg/observation. Continue IV fluids. Analgesics and antiemetics as needed Ciprofloxacin 400 mg IVPB every 12 hours. Start Flagyl empirically while waiting for stool C. difficile toxin.  Active Problems:   Diabetes mellitus ([name]) CBG monitoring with regular insulin sliding scale while receiving glucocorticoids.. Since the patient is having diarrhea, I  will hold metformin.    Hypertension Continue losartan 100 mg by mouth daily. Continue hydrochlorothiazide 12.5 mg by mouth daily. Monitor blood pressure periodically.    COPD (chronic obstructive pulmonary disease) ([name]) Bronchodilators as needed. His smoking cessation advice.    GERD (gastroesophageal reflux disease) Pantoprazole 40 mg by mouth daily.    OSA (obstructive sleep apnea) Per patient, she declined CPAP nocturnal instillation because she is unable to tolerate any thing on her face, like a mask. Agreed to keep nasal cannula oxygen.    Tobacco use disorder Declined nicotine replacement therapy.   Code Status: Full code. DVT Prophylaxis: Lovenox SQ. Family Communication:  Disposition Plan:    Time spent: Over 70 minutes were spent in the process this admission.  [name], MD [facility] Pager [phone].

## 2015-12-25 NOTE — Evaluation (Signed)
Physical Therapy Evaluation Patient Details Name: SHARADA SCHILTZ MRN: [id number] DOB: [birth year] Today's Date: 12/25/2015   History of Present Illness  70 yo female admitted with enteritis. Hx of DM, HTN, COPD, chronic pain, anxiety, OSA. Recent d/c 3/18.   Clinical Impression  On eval, pt was Min guard assist for mobility-walked ~15'x2 (to and from bathroom). O2 sats 89% with short walk. Pt was drowsy during session so deferred hallway ambulation. Recommend daily ambulation with nursing supervision.     Follow Up Recommendations No PT follow up;Supervision for mobility/OOB    Equipment Recommendations  None recommended by PT    Recommendations for Other Services       Precautions / Restrictions Precautions Precautions: Fall Precaution Comments: 2 falls prior to admission, monitor O2 Restrictions Weight Bearing Restrictions: No      Mobility  Bed Mobility Overal bed mobility: Modified Independent                Transfers     Transfers: Sit to/from Stand Sit to Stand: Supervision         General transfer comment: for safety  Ambulation/Gait Ambulation/Gait assistance: Min guard Ambulation Distance (Feet): 15 Feet (x2) Assistive device: None Gait Pattern/deviations: Step-through pattern;Decreased stride length     General Gait Details: close guard for safety. O2 sats 89% on RA while walking short distance in room. Pt was drowsy so deferred further ambulation.  Stairs            Wheelchair Mobility    Modified Rankin (Stroke Patients Only)       Balance Overall balance assessment: Needs assistance         Standing balance support: Single extremity supported;During functional activity                                 Pertinent Vitals/Pain Pain Assessment: No/denies pain    Home Living Family/patient expects to be discharged to:: Private residence Living Arrangements: Children Available Help at Discharge:  Family;Available 24 hours/day Type of Home:  (mobile home) Home Access: Stairs to enter Entrance Stairs-Rails: Right Entrance Stairs-Number of Steps: 8 Home Layout: One level Home Equipment: Walker - 2 wheels;Cane - single point      Prior Function Level of Independence: Independent with assistive device(s)         Comments: uses cane in home. sometimes uses walker.      Hand Dominance        Extremity/Trunk Assessment   Upper Extremity Assessment: Overall WFL for tasks assessed           Lower Extremity Assessment: Overall WFL for tasks assessed      Cervical / Trunk Assessment: Normal  Communication   Communication: No difficulties  Cognition Arousal/Alertness: Awake/alert (drowsy) Behavior During Therapy: WFL for tasks assessed/performed Overall Cognitive Status: Within Functional Limits for tasks assessed                      General Comments      Exercises        Assessment/Plan    PT Assessment Patient needs continued PT services  PT Diagnosis Difficulty walking   PT Problem List Decreased activity tolerance;Decreased mobility  PT Treatment Interventions Gait training;Functional mobility training;Therapeutic activities;Patient/family education;Balance training;Therapeutic exercise   PT Goals (Current goals can be found in the Care Plan section) Acute Rehab PT Goals Patient Stated Goal: none stated PT Goal Formulation: With patient  Time For Goal Achievement: 01/08/16 Potential to Achieve Goals: Good    Frequency Min 3X/week   Barriers to discharge        Co-evaluation               End of Session   Activity Tolerance: Patient tolerated treatment well Patient left: in bed;with call bell/phone within reach      Functional Assessment Tool Used: clinical judgement Functional Limitation: Mobility: Walking and moving around Mobility: Walking and Moving Around Current Status [id number]): At least 1 percent but less than 20  percent impaired, limited or restricted Mobility: Walking and Moving Around Goal Status [phone]): At least 1 percent but less than 20 percent impaired, limited or restricted    Time: 1146-1201 PT Time Calculation (min) (ACUTE ONLY): 15 min   Charges:   PT Evaluation $PT Eval Low Complexity: 1 Procedure     PT G Codes:   PT G-Codes **NOT FOR INPATIENT CLASS** Functional Assessment Tool Used: clinical judgement Functional Limitation: Mobility: Walking and moving around Mobility: Walking and Moving Around Current Status [id number]): At least 1 percent but less than 20 percent impaired, limited or restricted Mobility: Walking and Moving Around Goal Status [phone]): At least 1 percent but less than 20 percent impaired, limited or restricted    Weston Anna, MPT Pager: [phone]

## 2015-12-25 NOTE — Progress Notes (Signed)
Patient seen and examined by me.  Sleepy this AM-- thinks diarrhea has stopped but not sure X ray shows mod stool in right colon.  Patient needed home health and O2 at previous d/c but refused.  Will offer again- suspect hypoxia is contributing to her weakness  Eulogio Bear DO

## 2015-12-26 ENCOUNTER — Inpatient Hospital Stay (HOSPITAL_COMMUNITY): Payer: Medicare Other

## 2015-12-26 DIAGNOSIS — R1084 Generalized abdominal pain: Secondary | ICD-10-CM

## 2015-12-26 LAB — COMPREHENSIVE METABOLIC PANEL
ALT: 35 U/L (ref 14–54)
AST: 17 U/L (ref 15–41)
Albumin: 2.8 g/dL — ABNORMAL LOW (ref 3.5–5.0)
Alkaline Phosphatase: 55 U/L (ref 38–126)
Anion gap: 8 (ref 5–15)
BILIRUBIN TOTAL: 1 mg/dL (ref 0.3–1.2)
BUN: 11 mg/dL (ref 6–20)
CO2: 25 mmol/L (ref 22–32)
Calcium: 8.2 mg/dL — ABNORMAL LOW (ref 8.9–10.3)
Chloride: 107 mmol/L (ref 101–111)
Creatinine, Ser: 1.01 mg/dL — ABNORMAL HIGH (ref 0.44–1.00)
GFR calc Af Amer: >60 mL/min (ref >60)
GFR, EST NON AFRICAN AMERICAN: 56 mL/min — AB (ref >60)
Glucose, Bld: 103 mg/dL — ABNORMAL HIGH (ref 65–99)
POTASSIUM: 4 mmol/L (ref 3.5–5.1)
Sodium: 140 mmol/L (ref 135–145)
TOTAL PROTEIN: 5.9 g/dL — AB (ref 6.5–8.1)

## 2015-12-26 LAB — CBC WITH DIFFERENTIAL/PLATELET
BASOS ABS: 0 10*3/uL (ref 0.0–0.1)
Basophils Relative: 0 %
Eosinophils Absolute: 0.1 10*3/uL (ref 0.0–0.7)
Eosinophils Relative: 1 %
HEMATOCRIT: 37.5 % (ref 36.0–46.0)
Hemoglobin: 12.4 g/dL (ref 12.0–15.0)
LYMPHS ABS: 1.6 10*3/uL (ref 0.7–4.0)
LYMPHS PCT: 14 %
MCH: 29.1 pg (ref 26.0–34.0)
MCHC: 33.1 g/dL (ref 30.0–36.0)
MCV: 88 fL (ref 78.0–100.0)
MONO ABS: 1.2 10*3/uL — AB (ref 0.1–1.0)
MONOS PCT: 10 %
NEUTROS ABS: 8.8 10*3/uL — AB (ref 1.7–7.7)
Neutrophils Relative %: 75 %
Platelets: 190 10*3/uL (ref 150–400)
RBC: 4.26 MIL/uL (ref 3.87–5.11)
RDW: 14.3 % (ref 11.5–15.5)
WBC: 11.7 10*3/uL — ABNORMAL HIGH (ref 4.0–10.5)

## 2015-12-26 LAB — GLUCOSE, CAPILLARY
GLUCOSE-CAPILLARY: 101 mg/dL — AB (ref 65–99)
Glucose-Capillary: 99 mg/dL (ref 65–99)
Glucose-Capillary: 116 mg/dL — ABNORMAL HIGH (ref 65–99)

## 2015-12-26 MED ORDER — IOPAMIDOL (ISOVUE-300) INJECTION 61%
100.0000 mL | Freq: Once | INTRAVENOUS | Status: AC | PRN
  Administered 2015-12-26: 100 mL via INTRAVENOUS

## 2015-12-26 MED ORDER — BISACODYL 10 MG RE SUPP
10.0000 mg | Freq: Once | RECTAL | Status: AC
  Administered 2015-12-26: 10 mg via RECTAL
  Filled 2015-12-26: qty 1

## 2015-12-26 MED ORDER — IOPAMIDOL (ISOVUE-300) INJECTION 61%
25.0000 mL | Freq: Once | INTRAVENOUS | Status: AC | PRN
  Administered 2015-12-26: 25 mL via ORAL

## 2015-12-26 NOTE — Progress Notes (Signed)
PROGRESS NOTE  Victoria Bird [id number] DOB: 1945/10/22 DOA: 12/24/2015 PCP: Hoyt Koch, MD  Assessment/Plan: Abdominal pain Analgesics and antiemetics as needed Ciprofloxacin 400 mg IVPB every 12 hours. Start Flagyl empirically while waiting for stool C. difficile toxin. X ray show moderate stool-- give suppository if not improved then CT Scan of abd/pelvis -no further diarrhea-- d/c c diff   Diabetes mellitus ([name]) CBG monitoring with regular insulin sliding scale while receiving glucocorticoids..   Hypertension Continue losartan 100 mg by mouth daily. Continue hydrochlorothiazide 12.5 mg by mouth daily. Monitor blood pressure periodically.   COPD (chronic obstructive pulmonary disease) ([name]) Bronchodilators as needed. smoking cessation advice -last admission qualified for O2 but declined- requiring O2 here now-- recheck and order if needed   GERD (gastroesophageal reflux disease) Pantoprazole 40 mg by mouth daily.   OSA (obstructive sleep apnea) Per patient, she declined CPAP nocturnal instillation because she is unable to tolerate any thing on her face, like a mask. Agreed to keep nasal cannula oxygen.   Tobacco use disorder Declined nicotine replacement therapy.  Code Status: full Family Communication: patient Disposition Plan:    Consultants:    Procedures:  HPI/Subjective: Still with abd pain  Objective: Filed Vitals:   12/25/15 2021 12/26/15 0450  BP: 137/49 107/41  Pulse: 72 77  Temp: 98.2 F (36.8 C) 99.6 F (37.6 C)  Resp: 15 16    Intake/Output Summary (Last 24 hours) at 12/26/15 0916 Last data filed at 12/26/15 0449  Gross per 24 hour  Intake   1200 ml  Output   2050 ml  Net   -850 ml   Filed Weights   12/24/15 2225 12/25/15 0455  Weight: 75.116 kg (165 lb 9.6 oz) 75.438 kg (166 lb 5 oz)    Exam:   General:  Awake, NAD- 2L O2  Cardiovascular: rrr  Respiratory: no wheezing  Abdomen: diminished bowel  sounds, soft  Musculoskeletal:  No edema  Data Reviewed: Basic Metabolic Panel:  Recent Labs Lab 12/24/15 2242 12/26/15 0406  NA 141 140  K 4.3 4.0  CL 102 107  CO2 29 25  GLUCOSE 141* 103*  BUN 17 11  CREATININE 1.27* 1.01*  CALCIUM 9.1 8.2*   Liver Function Tests:  Recent Labs Lab 12/24/15 2242 12/26/15 0406  AST 28 17  ALT 43 35  ALKPHOS 69 55  BILITOT 1.1 1.0  PROT 7.4 5.9*  ALBUMIN 3.7 2.8*    Recent Labs Lab 12/24/15 2242  LIPASE 34   No results for input(s): AMMONIA in the last 168 hours. CBC:  Recent Labs Lab 12/24/15 2242 12/26/15 0406  WBC 11.6* 11.7*  NEUTROABS  --  8.8*  HGB 14.7 12.4  HCT 45.4 37.5  MCV 87.6 88.0  PLT 211 190   Cardiac Enzymes: No results for input(s): CKTOTAL, CKMB, CKMBINDEX, TROPONINI in the last 168 hours. BNP (last 3 results) No results for input(s): BNP in the last 8760 hours.  ProBNP (last 3 results) No results for input(s): PROBNP in the last 8760 hours.  CBG:  Recent Labs Lab 12/25/15 0754 12/25/15 1215 12/25/15 1639 12/25/15 2017 12/26/15 0807  GLUCAP 135* 90 89 128* 99    No results found for this or any previous visit (from the past 240 hour(s)).   Studies: Dg Abd Portable 1v  12/25/2015  CLINICAL DATA:  Abdominal pain, nausea and vomiting EXAM: PORTABLE ABDOMEN - 1 VIEW COMPARISON:  None. FINDINGS: There is normal small bowel gas pattern. Moderate stool noted in right  colon. Moderate gas noted within transverse colon. No small bowel air-fluid levels. IMPRESSION: Normal small bowel gas pattern. Moderate stool in right colon. Moderate colonic gas in transverse colon. Electronically Signed   By: Lahoma Crocker M.D.   On: 12/25/2015 11:23    Scheduled Meds: . bisacodyl  10 mg Rectal Once  . ciprofloxacin  400 mg Intravenous Q12H  . enoxaparin (LOVENOX) injection  40 mg Subcutaneous Q24H  . guaiFENesin  1,200 mg Oral BID  . losartan  100 mg Oral Daily   And  . hydrochlorothiazide  12.5 mg Oral  Daily  . insulin aspart  0-15 Units Subcutaneous TID WC  . ipratropium-albuterol  3 mL Nebulization BID  . metronidazole  500 mg Intravenous Q8H  . pantoprazole (PROTONIX) IV  40 mg Intravenous Q24H  . tiotropium  18 mcg Inhalation Daily   Continuous Infusions:  Antibiotics Given (last 72 hours)    Date/Time Action Medication Dose Rate   12/25/15 0825 Given   metroNIDAZOLE (FLAGYL) IVPB 500 mg 500 mg 100 mL/hr   12/25/15 0827 Given   ciprofloxacin (CIPRO) IVPB 400 mg 400 mg 200 mL/hr   12/25/15 1657 Given   metroNIDAZOLE (FLAGYL) IVPB 500 mg 500 mg 100 mL/hr   12/25/15 1953 Given   ciprofloxacin (CIPRO) IVPB 400 mg 400 mg 200 mL/hr   12/25/15 2328 Given   metroNIDAZOLE (FLAGYL) IVPB 500 mg 500 mg 100 mL/hr   12/26/15 [id number] Given   metroNIDAZOLE (FLAGYL) IVPB 500 mg 500 mg 100 mL/hr   12/26/15 [id number] Given   ciprofloxacin (CIPRO) IVPB 400 mg 400 mg 200 mL/hr      Principal Problem:   Enteritis Active Problems:   Diabetes mellitus ([place])   Hypertension   COPD (chronic obstructive pulmonary disease) ([place])   GERD (gastroesophageal reflux disease)   OSA (obstructive sleep apnea)   Hyperlipidemia   Diabetes mellitus type 2, controlled ([place])   Tobacco use disorder    Time spent: 25 min    [place] [facility] Pager [phone]. If 7PM-7AM, please contact night-coverage at [email or web address], password [facility] 12/26/2015, 9:16 AM  LOS: 1 day

## 2015-12-26 NOTE — Plan of Care (Signed)
Problem: Safety: Goal: Ability to remain free from injury will improve Outcome: Not Progressing Pt refuses bed alarm. Has asked for assist to bathroom.

## 2015-12-26 NOTE — Plan of Care (Signed)
Problem: Health Behavior/Discharge Planning: Goal: Ability to manage health-related needs will improve Outcome: Progressing Ongoing efforts to encourage compliance with home O2

## 2015-12-27 LAB — GLUCOSE, CAPILLARY
GLUCOSE-CAPILLARY: 115 mg/dL — AB (ref 65–99)
GLUCOSE-CAPILLARY: 133 mg/dL — AB (ref 65–99)
Glucose-Capillary: 98 mg/dL (ref 65–99)
Glucose-Capillary: 147 mg/dL — ABNORMAL HIGH (ref 65–99)

## 2015-12-27 LAB — CBC WITH DIFFERENTIAL/PLATELET
BASOS ABS: 0 10*3/uL (ref 0.0–0.1)
BASOS PCT: 0 %
EOS ABS: 0.1 10*3/uL (ref 0.0–0.7)
Eosinophils Relative: 1 %
HEMATOCRIT: 37 % (ref 36.0–46.0)
HEMOGLOBIN: 12.1 g/dL (ref 12.0–15.0)
Lymphocytes Relative: 14 %
Lymphs Abs: 1.3 10*3/uL (ref 0.7–4.0)
MCH: 28.8 pg (ref 26.0–34.0)
MCHC: 32.7 g/dL (ref 30.0–36.0)
MCV: 88.1 fL (ref 78.0–100.0)
MONOS PCT: 10 %
Monocytes Absolute: 0.9 10*3/uL (ref 0.1–1.0)
NEUTROS ABS: 7.4 10*3/uL (ref 1.7–7.7)
NEUTROS PCT: 75 %
Platelets: 193 10*3/uL (ref 150–400)
RBC: 4.2 MIL/uL (ref 3.87–5.11)
RDW: 14.1 % (ref 11.5–15.5)
WBC: 9.7 10*3/uL (ref 4.0–10.5)

## 2015-12-27 LAB — COMPREHENSIVE METABOLIC PANEL
ALBUMIN: 2.8 g/dL — AB (ref 3.5–5.0)
ALK PHOS: 55 U/L (ref 38–126)
ALT: 26 U/L (ref 14–54)
ANION GAP: 8 (ref 5–15)
AST: 14 U/L — AB (ref 15–41)
BILIRUBIN TOTAL: 0.8 mg/dL (ref 0.3–1.2)
BUN: 13 mg/dL (ref 6–20)
CHLORIDE: 103 mmol/L (ref 101–111)
CO2: 25 mmol/L (ref 22–32)
CREATININE: 1.19 mg/dL — AB (ref 0.44–1.00)
Calcium: 8.3 mg/dL — ABNORMAL LOW (ref 8.9–10.3)
GFR, EST AFRICAN AMERICAN: 53 mL/min — AB (ref >60)
GFR, EST NON AFRICAN AMERICAN: 46 mL/min — AB (ref >60)
Glucose, Bld: 107 mg/dL — ABNORMAL HIGH (ref 65–99)
POTASSIUM: 4 mmol/L (ref 3.5–5.1)
SODIUM: 136 mmol/L (ref 135–145)
Total Protein: 5.9 g/dL — ABNORMAL LOW (ref 6.5–8.1)

## 2015-12-27 LAB — URINE CULTURE

## 2015-12-27 MED ORDER — ACETAMINOPHEN 325 MG PO TABS
650.0000 mg | ORAL_TABLET | ORAL | Status: DC | PRN
  Administered 2015-12-27 – 2015-12-29 (×3): 650 mg via ORAL
  Filled 2015-12-27 (×3): qty 2

## 2015-12-27 MED ORDER — SODIUM CHLORIDE 0.9 % IV SOLN
INTRAVENOUS | Status: DC
  Administered 2015-12-27 – 2015-12-29 (×4): via INTRAVENOUS
  Administered 2015-12-30: 75 mL/h via INTRAVENOUS

## 2015-12-27 MED ORDER — SODIUM CHLORIDE 0.9 % IV BOLUS (SEPSIS)
500.0000 mL | Freq: Once | INTRAVENOUS | Status: AC
  Administered 2015-12-27: 500 mL via INTRAVENOUS

## 2015-12-27 MED ORDER — PANTOPRAZOLE SODIUM 40 MG PO TBEC
40.0000 mg | DELAYED_RELEASE_TABLET | Freq: Every day | ORAL | Status: DC
  Administered 2015-12-28 – 2015-12-31 (×4): 40 mg via ORAL
  Filled 2015-12-27 (×4): qty 1

## 2015-12-27 NOTE — Progress Notes (Signed)
Physical Therapy Treatment Patient Details Name: Victoria Bird MRN: [id number] DOB: [birth year] Today's Date: 12/27/2015    History of Present Illness 70 yo female admitted with enteritis. Hx of DM, HTN, COPD, chronic pain, anxiety, OSA. Recent d/c 3/18.     PT Comments    Assisted pt OOB to bathroom then amb a greater distance in hallway holding to IV pole.  Min c/o ABd pain (sharp/deep)  RA lowest 88% with HR 91%  Follow Up Recommendations  No PT follow up;Supervision for mobility/OOB     Equipment Recommendations  None recommended by PT    Recommendations for Other Services       Precautions / Restrictions Precautions Precautions: Fall Precaution Comments: 2 falls prior to admission, monitor O2 Restrictions Weight Bearing Restrictions: No    Mobility  Bed Mobility Overal bed mobility: Modified Independent             General bed mobility comments: HOB up 30*  Transfers Overall transfer level: Modified independent Equipment used: None Transfers: Sit to/from [facility]   Stand pivot transfers: Independent       General transfer comment: assisted OOB and to bathroom.  Good use of hands to steady self.   Ambulation/Gait Ambulation/Gait assistance: Min guard Ambulation Distance (Feet): 85 Feet Assistive device: None (holding to IV pole) Gait Pattern/deviations: Step-through pattern;Decreased stride length Gait velocity: WFL   General Gait Details: close guard for safety. RA O2 sats 88%  while walking a great distance in hallway.     Stairs            Wheelchair Mobility    Modified Rankin (Stroke Patients Only)       Balance                                    Cognition Arousal/Alertness: Awake/alert Behavior During Therapy: WFL for tasks assessed/performed Overall Cognitive Status: Within Functional Limits for tasks assessed                      Exercises      General Comments         Pertinent Vitals/Pain      Home Living                      Prior Function            PT Goals (current goals can now be found in the care plan section) Progress towards PT goals: Progressing toward goals    Frequency  Min 3X/week    PT Plan Current plan remains appropriate    Co-evaluation             End of Session Equipment Utilized During Treatment: Gait belt Activity Tolerance: Patient tolerated treatment well Patient left: in bed;with call bell/phone within reach     Time: 1055-1115 PT Time Calculation (min) (ACUTE ONLY): 20 min  Charges:  $Gait Training: 8-22 mins                    G Codes:      Rica Koyanagi  PTA [facility]  Acute  Rehab Pager      [phone]

## 2015-12-27 NOTE — Progress Notes (Signed)
PHARMACIST - PHYSICIAN COMMUNICATION  CONCERNING: IV to Oral Route Change Policy  RECOMMENDATION: This patient is receiving pantoprazole by the intravenous route.  Based on criteria approved by the Pharmacy and Therapeutics Committee, the intravenous medication(s) is/are being converted to the equivalent oral dose form(s).   DESCRIPTION: These criteria include:  The patient is eating (either orally or via tube) and/or has been taking other orally administered medications for a least 24 hours  The patient has no evidence of active gastrointestinal bleeding or impaired GI absorption (gastrectomy, short bowel, patient on TNA or NPO).  If you have questions about this conversion, please contact the Pharmacy Department  []   [phone] )  [facility] []   [phone] )  [name] []   [phone] )  [facility] []   [phone] )  [facility] [x]   [phone] )  [place], PharmD candidate 12/27/2015 8:19 AM

## 2015-12-27 NOTE — Progress Notes (Signed)
Pt called out for N/V. Pt was given antiemetic, Zofran as ordered. She stated that she felt dizzy when she came back from the bathroom and began to dry heave.  Pt vital signs were: P:82, RR: 20 BP: 88/57(62) SpO2: 89 RA ( 95%2L). Neuro assessment WNL, other than weakness in rt leg from previous surgery, pt BUE and BLE motor strength was WNL.  Pt encouraged to call before she proceeds to the bathroom again. Refused to allow bed alarm to be set even though she understands risk for fall and admitted to having a fall prior to admission.  Pt states she will use call light when she needs help with getting out of bed.

## 2015-12-27 NOTE — Progress Notes (Addendum)
PROGRESS NOTE  Victoria Bird [id number] DOB: 1945-11-25 DOA: 12/24/2015 PCP: Hoyt Koch, MD  Victoria Bird is a 70 y.o. female with a past medical history of type 2 diabetes, hypertension, COPD, chronic pain, anxiety, hyperlipidemia, obstructive sleep apnea, GERD who was recently admitted and discharged on 12/18/2015 for COPD exacerbation (refused O2 at that time despite needing it) and returns now to the emergency department with a week history of lower abdominal pain.  She was found to have diverticulitis on CT scan.     Assessment/Plan: Abdominal pain due to diverticulitis Analgesics and antiemetics as needed Cipro/flagyl X ray show moderate stool CT scan done as patient's pain continued and diverticulitis was found   Diabetes mellitus ([name]) CBG monitoring with regular insulin sliding scale   Hypertension Hold BP meds due to hypotension   COPD (chronic obstructive pulmonary disease) ([name]) Bronchodilators as needed. smoking cessation advice -last admission qualified for O2 but declined- requiring O2 here now-- recheck and order if needed   GERD (gastroesophageal reflux disease) Pantoprazole 40 mg by mouth daily.   OSA (obstructive sleep apnea) Per patient, she declined CPAP nocturnal instillation because she is unable to tolerate any thing on her face, like a mask. Agreed to keep nasal cannula oxygen.   Tobacco use disorder Declined nicotine replacement therapy.  Code Status: full Family Communication: patient Disposition Plan:    Consultants:    Procedures:  HPI/Subjective: Pain improved if patient does not move  Objective: Filed Vitals:   12/27/15 0345 12/27/15 0530  BP: 88/57 98/55  Pulse: 82 74  Temp:  99.2 F (37.3 C)  Resp: 20 16    Intake/Output Summary (Last 24 hours) at 12/27/15 0831 Last data filed at 12/27/15 0531  Gross per 24 hour  Intake    720 ml  Output   2850 ml  Net  -2130 ml   Filed Weights   12/24/15  2225 12/25/15 0455  Weight: 75.116 kg (165 lb 9.6 oz) 75.438 kg (166 lb 5 oz)    Exam:   General:  Awake, NAD- 2L O2  Cardiovascular: rrr  Respiratory: no wheezing  Abdomen: tender in multiple quadrants  Musculoskeletal:  No edema  Data Reviewed: Basic Metabolic Panel:  Recent Labs Lab 12/24/15 2242 12/26/15 0406 12/27/15 0442  NA 141 140 136  K 4.3 4.0 4.0  CL 102 107 103  CO2 29 25 25   GLUCOSE 141* 103* 107*  BUN 17 11 13   CREATININE 1.27* 1.01* 1.19*  CALCIUM 9.1 8.2* 8.3*   Liver Function Tests:  Recent Labs Lab 12/24/15 2242 12/26/15 0406 12/27/15 0442  AST 28 17 14*  ALT 43 35 26  ALKPHOS 69 55 55  BILITOT 1.1 1.0 0.8  PROT 7.4 5.9* 5.9*  ALBUMIN 3.7 2.8* 2.8*    Recent Labs Lab 12/24/15 2242  LIPASE 34   No results for input(s): AMMONIA in the last 168 hours. CBC:  Recent Labs Lab 12/24/15 2242 12/26/15 0406 12/27/15 0442  WBC 11.6* 11.7* 9.7  NEUTROABS  --  8.8* 7.4  HGB 14.7 12.4 12.1  HCT 45.4 37.5 37.0  MCV 87.6 88.0 88.1  PLT 211 190 193   Cardiac Enzymes: No results for input(s): CKTOTAL, CKMB, CKMBINDEX, TROPONINI in the last 168 hours. BNP (last 3 results) No results for input(s): BNP in the last 8760 hours.  ProBNP (last 3 results) No results for input(s): PROBNP in the last 8760 hours.  CBG:  Recent Labs Lab 12/25/15 2017 12/26/15 0807 12/26/15 1128  12/26/15 1752 12/27/15 0758  GLUCAP 128* 99 116* 101* 115*    Recent Results (from the past 240 hour(s))  Urine culture     Status: None (Preliminary result)   Collection Time: 12/24/15 10:57 PM  Result Value Ref Range Status   Specimen Description URINE, CLEAN CATCH  Final   Special Requests NONE  Final   Culture   Final    TOO YOUNG TO READ Performed at [facility]    Report Status PENDING  Incomplete     Studies: Ct Abdomen Pelvis W Contrast  12/27/2015  CLINICAL DATA:  70 year old female with abdominal pain. EXAM: CT ABDOMEN AND PELVIS WITH  CONTRAST TECHNIQUE: Multidetector CT imaging of the abdomen and pelvis was performed using the standard protocol following bolus administration of intravenous contrast. CONTRAST:  44mL ISOVUE-300 IOPAMIDOL (ISOVUE-300) INJECTION 61%, 141mL ISOVUE-300 IOPAMIDOL (ISOVUE-300) INJECTION 61% COMPARISON:  Radiograph dated 12/25/2015 FINDINGS: The visualized lung bases are clear. No intra-abdominal free air or free fluid. Cholecystectomy. The liver, pancreas, spleen, adrenal glands appear unremarkable. There is mild bilateral renal atrophy. There is minimal fullness of the right renal collecting system. There is no hydronephrosis on either side. The urinary bladder is distended. Hysterectomy. There is sigmoid diverticulosis. There is focal inflammatory changes of the sigmoid diverticula compatible with diverticulitis. No extraluminal air or evidence of diverticular abscess. There are scattered colonic diverticula. There is no evidence of bowel obstruction. Appendectomy. There is aortoiliac atherosclerotic disease. There is a 3.8 cm infrarenal abdominal aortic aneurysm with the penetrating ulcer. The origins of the celiac axis, SMA, IMA as well as the origins of the renal arteries are patent. No portal venous gas identified. There is no adenopathy. The abdominal wall soft tissues appear unremarkable. There is osteopenia with degenerative changes of the spine. No acute fracture. IMPRESSION: Sigmoid diverticulitis.  No abscess or perforation. Extensive atheromatous plaque along the aorta with a 3.8 cm infrarenal abdominal aortic aneurysm. Recommend followup by ultrasound in 2 years. This recommendation follows ACR consensus guidelines: White Paper of the ACR Incidental Findings Committee II on Vascular Findings. J Am Coll Radiol 2013; 10:789-794. Electronically Signed   By: Anner Crete M.D.   On: 12/27/2015 01:12   Dg Abd Portable 1v  12/25/2015  CLINICAL DATA:  Abdominal pain, nausea and vomiting EXAM: PORTABLE  ABDOMEN - 1 VIEW COMPARISON:  None. FINDINGS: There is normal small bowel gas pattern. Moderate stool noted in right colon. Moderate gas noted within transverse colon. No small bowel air-fluid levels. IMPRESSION: Normal small bowel gas pattern. Moderate stool in right colon. Moderate colonic gas in transverse colon. Electronically Signed   By: Lahoma Crocker M.D.   On: 12/25/2015 11:23    Scheduled Meds: . ciprofloxacin  400 mg Intravenous Q12H  . enoxaparin (LOVENOX) injection  40 mg Subcutaneous Q24H  . guaiFENesin  1,200 mg Oral BID  . insulin aspart  0-15 Units Subcutaneous TID WC  . ipratropium-albuterol  3 mL Nebulization BID  . metronidazole  500 mg Intravenous Q8H  . pantoprazole (PROTONIX) IV  40 mg Intravenous Q24H  . tiotropium  18 mcg Inhalation Daily   Continuous Infusions: . sodium chloride     Antibiotics Given (last 72 hours)    Date/Time Action Medication Dose Rate   12/25/15 0825 Given   metroNIDAZOLE (FLAGYL) IVPB 500 mg 500 mg 100 mL/hr   12/25/15 0827 Given   ciprofloxacin (CIPRO) IVPB 400 mg 400 mg 200 mL/hr   12/25/15 1657 Given   metroNIDAZOLE (FLAGYL) IVPB  500 mg 500 mg 100 mL/hr   12/25/15 1953 Given   ciprofloxacin (CIPRO) IVPB 400 mg 400 mg 200 mL/hr   12/25/15 2328 Given   metroNIDAZOLE (FLAGYL) IVPB 500 mg 500 mg 100 mL/hr   12/26/15 [id number] Given   metroNIDAZOLE (FLAGYL) IVPB 500 mg 500 mg 100 mL/hr   12/26/15 [id number] Given   ciprofloxacin (CIPRO) IVPB 400 mg 400 mg 200 mL/hr   12/26/15 1528 Given   metroNIDAZOLE (FLAGYL) IVPB 500 mg 500 mg 100 mL/hr   12/26/15 2002 Given   ciprofloxacin (CIPRO) IVPB 400 mg 400 mg 200 mL/hr   12/26/15 2339 Given   metroNIDAZOLE (FLAGYL) IVPB 500 mg 500 mg 100 mL/hr   12/27/15 0744 Given   metroNIDAZOLE (FLAGYL) IVPB 500 mg 500 mg 100 mL/hr      Principal Problem:   Enteritis Active Problems:   Diabetes mellitus ([name])   Hypertension   COPD (chronic obstructive pulmonary disease) ([name])   GERD (gastroesophageal  reflux disease)   OSA (obstructive sleep apnea)   Hyperlipidemia   Diabetes mellitus type 2, controlled ([place])   Tobacco use disorder    Time spent: 25 min    [facility] Pager [phone]. If 7PM-7AM, please contact night-coverage at [email or web address], password [facility] 12/27/2015, 8:31 AM  LOS: 2 days

## 2015-12-28 LAB — CBC
HEMATOCRIT: 34.5 % — AB (ref 36.0–46.0)
Hemoglobin: 11.3 g/dL — ABNORMAL LOW (ref 12.0–15.0)
MCH: 29 pg (ref 26.0–34.0)
MCHC: 32.8 g/dL (ref 30.0–36.0)
MCV: 88.7 fL (ref 78.0–100.0)
Platelets: 196 10*3/uL (ref 150–400)
RBC: 3.89 MIL/uL (ref 3.87–5.11)
RDW: 14.2 % (ref 11.5–15.5)
WBC: 6.7 10*3/uL (ref 4.0–10.5)

## 2015-12-28 LAB — BASIC METABOLIC PANEL
Anion gap: 8 (ref 5–15)
BUN: 10 mg/dL (ref 6–20)
CO2: 27 mmol/L (ref 22–32)
CREATININE: 1.05 mg/dL — AB (ref 0.44–1.00)
Calcium: 8.4 mg/dL — ABNORMAL LOW (ref 8.9–10.3)
Chloride: 107 mmol/L (ref 101–111)
GFR calc non Af Amer: 53 mL/min — ABNORMAL LOW (ref >60)
GLUCOSE: 93 mg/dL (ref 65–99)
Potassium: 3.9 mmol/L (ref 3.5–5.1)
Sodium: 142 mmol/L (ref 135–145)

## 2015-12-28 LAB — GLUCOSE, CAPILLARY
GLUCOSE-CAPILLARY: 177 mg/dL — AB (ref 65–99)
Glucose-Capillary: 93 mg/dL (ref 65–99)
Glucose-Capillary: 140 mg/dL — ABNORMAL HIGH (ref 65–99)
Glucose-Capillary: 174 mg/dL — ABNORMAL HIGH (ref 65–99)

## 2015-12-28 NOTE — Progress Notes (Signed)
PROGRESS NOTE  Victoria Bird [id number] DOB: [birth year] DOA: 12/24/2015 PCP: Hoyt Koch, MD  Victoria Bird is a 70 y.o. female with a past medical history of type 2 diabetes, hypertension, COPD, chronic pain, anxiety, hyperlipidemia, obstructive sleep apnea, GERD who was recently admitted and discharged on 12/18/2015 for COPD exacerbation (refused O2 at that time despite needing it) and returns now to the emergency department with a week history of lower abdominal pain.  She was found to have diverticulitis on CT scan.     Assessment/Plan: Abdominal pain due to diverticulitis Analgesics and antiemetics as needed Cipro/flagyl X ray show moderate stool CT scan done as patient's pain continued and diverticulitis was found   Diabetes mellitus ([name]) CBG monitoring with regular insulin sliding scale   Hypertension Hold BP meds due to hypotension   COPD (chronic obstructive pulmonary disease) ([name]) Bronchodilators as needed. smoking cessation advice -last admission qualified for O2 but declined- but sats did not go < 88% upon ambulation   GERD (gastroesophageal reflux disease) Pantoprazole 40 mg by mouth daily.   OSA (obstructive sleep apnea) Per patient, she declined CPAP nocturnal instillation because she is unable to tolerate any thing on her face, like a mask. Agreed to keep nasal cannula oxygen.   Tobacco use disorder Declined nicotine replacement therapy.  Code Status: full Family Communication: patient Disposition Plan:    Consultants:    Procedures:  HPI/Subjective: Eating more Pain decreasing   Objective: Filed Vitals:   12/27/15 2138 12/28/15 0534  BP: 101/49 107/47  Pulse: 55 58  Temp: 98.3 F (36.8 C) 98.1 F (36.7 C)  Resp: 16 16    Intake/Output Summary (Last 24 hours) at 12/28/15 1140 Last data filed at 12/28/15 1054  Gross per 24 hour  Intake 2782.5 ml  Output   1900 ml  Net  882.5 ml   Filed Weights   12/24/15 2225 12/25/15 0455  Weight: 75.116 kg (165 lb 9.6 oz) 75.438 kg (166 lb 5 oz)    Exam:   General:  Awake, NAD- 2L O2  Cardiovascular: rrr  Respiratory: no wheezing  Abdomen: tender in multiple quadrants  Musculoskeletal:  No edema  Data Reviewed: Basic Metabolic Panel:  Recent Labs Lab 12/24/15 2242 12/26/15 0406 12/27/15 0442 12/28/15 0439  NA 141 140 136 142  K 4.3 4.0 4.0 3.9  CL 102 107 103 107  CO2 29 25 25 27   GLUCOSE 141* 103* 107* 93  BUN 17 11 13 10   CREATININE 1.27* 1.01* 1.19* 1.05*  CALCIUM 9.1 8.2* 8.3* 8.4*   Liver Function Tests:  Recent Labs Lab 12/24/15 2242 12/26/15 0406 12/27/15 0442  AST 28 17 14*  ALT 43 35 26  ALKPHOS 69 55 55  BILITOT 1.1 1.0 0.8  PROT 7.4 5.9* 5.9*  ALBUMIN 3.7 2.8* 2.8*    Recent Labs Lab 12/24/15 2242  LIPASE 34   No results for input(s): AMMONIA in the last 168 hours. CBC:  Recent Labs Lab 12/24/15 2242 12/26/15 0406 12/27/15 0442 12/28/15 0439  WBC 11.6* 11.7* 9.7 6.7  NEUTROABS  --  8.8* 7.4  --   HGB 14.7 12.4 12.1 11.3*  HCT 45.4 37.5 37.0 34.5*  MCV 87.6 88.0 88.1 88.7  PLT 211 190 193 196   Cardiac Enzymes: No results for input(s): CKTOTAL, CKMB, CKMBINDEX, TROPONINI in the last 168 hours. BNP (last 3 results) No results for input(s): BNP in the last 8760 hours.  ProBNP (last 3 results) No results for input(s): PROBNP  in the last 8760 hours.  CBG:  Recent Labs Lab 12/27/15 0758 12/27/15 1147 12/27/15 1631 12/27/15 2138 12/28/15 0757  GLUCAP 115* 133* 98 147* 93    Recent Results (from the past 240 hour(s))  Urine culture     Status: None   Collection Time: 12/24/15 10:57 PM  Result Value Ref Range Status   Specimen Description URINE, CLEAN CATCH  Final   Special Requests NONE  Final   Culture   Final    MULTIPLE SPECIES PRESENT, SUGGEST RECOLLECTION Performed at [facility]    Report Status 12/27/2015 FINAL  Final     Studies: Ct Abdomen Pelvis W  Contrast  12/27/2015  CLINICAL DATA:  70 year old female with abdominal pain. EXAM: CT ABDOMEN AND PELVIS WITH CONTRAST TECHNIQUE: Multidetector CT imaging of the abdomen and pelvis was performed using the standard protocol following bolus administration of intravenous contrast. CONTRAST:  51mL ISOVUE-300 IOPAMIDOL (ISOVUE-300) INJECTION 61%, 128mL ISOVUE-300 IOPAMIDOL (ISOVUE-300) INJECTION 61% COMPARISON:  Radiograph dated 12/25/2015 FINDINGS: The visualized lung bases are clear. No intra-abdominal free air or free fluid. Cholecystectomy. The liver, pancreas, spleen, adrenal glands appear unremarkable. There is mild bilateral renal atrophy. There is minimal fullness of the right renal collecting system. There is no hydronephrosis on either side. The urinary bladder is distended. Hysterectomy. There is sigmoid diverticulosis. There is focal inflammatory changes of the sigmoid diverticula compatible with diverticulitis. No extraluminal air or evidence of diverticular abscess. There are scattered colonic diverticula. There is no evidence of bowel obstruction. Appendectomy. There is aortoiliac atherosclerotic disease. There is a 3.8 cm infrarenal abdominal aortic aneurysm with the penetrating ulcer. The origins of the celiac axis, SMA, IMA as well as the origins of the renal arteries are patent. No portal venous gas identified. There is no adenopathy. The abdominal wall soft tissues appear unremarkable. There is osteopenia with degenerative changes of the spine. No acute fracture. IMPRESSION: Sigmoid diverticulitis.  No abscess or perforation. Extensive atheromatous plaque along the aorta with a 3.8 cm infrarenal abdominal aortic aneurysm. Recommend followup by ultrasound in 2 years. This recommendation follows ACR consensus guidelines: White Paper of the ACR Incidental Findings Committee II on Vascular Findings. J Am Coll Radiol 2013; 10:789-794. Electronically Signed   By: Anner Crete M.D.   On: 12/27/2015  01:12    Scheduled Meds: . ciprofloxacin  400 mg Intravenous Q12H  . enoxaparin (LOVENOX) injection  40 mg Subcutaneous Q24H  . guaiFENesin  1,200 mg Oral BID  . insulin aspart  0-15 Units Subcutaneous TID WC  . ipratropium-albuterol  3 mL Nebulization BID  . metronidazole  500 mg Intravenous Q8H  . pantoprazole  40 mg Oral Daily  . tiotropium  18 mcg Inhalation Daily   Continuous Infusions: . sodium chloride 75 mL/hr at 12/28/15 0107   Antibiotics Given (last 72 hours)    Date/Time Action Medication Dose Rate   12/25/15 1657 Given   metroNIDAZOLE (FLAGYL) IVPB 500 mg 500 mg 100 mL/hr   12/25/15 1953 Given   ciprofloxacin (CIPRO) IVPB 400 mg 400 mg 200 mL/hr   12/25/15 2328 Given   metroNIDAZOLE (FLAGYL) IVPB 500 mg 500 mg 100 mL/hr   12/26/15 [id number] Given   metroNIDAZOLE (FLAGYL) IVPB 500 mg 500 mg 100 mL/hr   12/26/15 [id number] Given   ciprofloxacin (CIPRO) IVPB 400 mg 400 mg 200 mL/hr   12/26/15 1528 Given   metroNIDAZOLE (FLAGYL) IVPB 500 mg 500 mg 100 mL/hr   12/26/15 2002 Given   ciprofloxacin (CIPRO) IVPB  400 mg 400 mg 200 mL/hr   12/26/15 2339 Given   metroNIDAZOLE (FLAGYL) IVPB 500 mg 500 mg 100 mL/hr   12/27/15 0744 Given   metroNIDAZOLE (FLAGYL) IVPB 500 mg 500 mg 100 mL/hr   12/27/15 0800 Given   ciprofloxacin (CIPRO) IVPB 400 mg 400 mg 200 mL/hr   12/27/15 1553 Given   metroNIDAZOLE (FLAGYL) IVPB 500 mg 500 mg 100 mL/hr   12/27/15 1908 Given   ciprofloxacin (CIPRO) IVPB 400 mg 400 mg 200 mL/hr   12/28/15 0106 Given   metroNIDAZOLE (FLAGYL) IVPB 500 mg 500 mg 100 mL/hr   12/28/15 0820 Given   metroNIDAZOLE (FLAGYL) IVPB 500 mg 500 mg 100 mL/hr   12/28/15 0820 Given   ciprofloxacin (CIPRO) IVPB 400 mg 400 mg 200 mL/hr      Principal Problem:   Enteritis Active Problems:   Diabetes mellitus ([place])   Hypertension   COPD (chronic obstructive pulmonary disease) ([place])   GERD (gastroesophageal reflux disease)   OSA (obstructive sleep apnea)   Hyperlipidemia    Diabetes mellitus type 2, controlled ([place])   Tobacco use disorder    Time spent: 25 min    [facility] Pager [phone]. If 7PM-7AM, please contact night-coverage at [email or web address], password [facility] 12/28/2015, 11:40 AM  LOS: 3 days

## 2015-12-28 NOTE — Care Management Important Message (Signed)
Important Message  Patient Details  Name: Victoria Bird MRN: [id number] Date of Birth: 18-Feb-1946   Medicare Important Message Given:  Yes    Camillo Flaming 12/28/2015, 11:09 AMImportant Message  Patient Details  Name: Victoria Bird MRN: [id number] Date of Birth: 10/16/1945   Medicare Important Message Given:  Yes    Camillo Flaming 12/28/2015, 11:09 AM

## 2015-12-29 ENCOUNTER — Inpatient Hospital Stay (HOSPITAL_COMMUNITY): Payer: Medicare Other

## 2015-12-29 DIAGNOSIS — G4733 Obstructive sleep apnea (adult) (pediatric): Secondary | ICD-10-CM

## 2015-12-29 DIAGNOSIS — E0800 Diabetes mellitus due to underlying condition with hyperosmolarity without nonketotic hyperglycemic-hyperosmolar coma (NKHHC): Secondary | ICD-10-CM

## 2015-12-29 DIAGNOSIS — J410 Simple chronic bronchitis: Secondary | ICD-10-CM

## 2015-12-29 DIAGNOSIS — K529 Noninfective gastroenteritis and colitis, unspecified: Secondary | ICD-10-CM

## 2015-12-29 DIAGNOSIS — K219 Gastro-esophageal reflux disease without esophagitis: Secondary | ICD-10-CM

## 2015-12-29 LAB — GLUCOSE, CAPILLARY
GLUCOSE-CAPILLARY: 97 mg/dL (ref 65–99)
GLUCOSE-CAPILLARY: 110 mg/dL — AB (ref 65–99)
Glucose-Capillary: 158 mg/dL — ABNORMAL HIGH (ref 65–99)
Glucose-Capillary: 152 mg/dL — ABNORMAL HIGH (ref 65–99)

## 2015-12-29 MED ORDER — SENNOSIDES-DOCUSATE SODIUM 8.6-50 MG PO TABS
2.0000 | ORAL_TABLET | Freq: Two times a day (BID) | ORAL | Status: DC
  Administered 2015-12-29 – 2015-12-30 (×3): 2 via ORAL
  Filled 2015-12-29 (×7): qty 2

## 2015-12-29 MED ORDER — POLYETHYLENE GLYCOL 3350 17 G PO PACK
17.0000 g | PACK | Freq: Two times a day (BID) | ORAL | Status: DC
  Administered 2015-12-29: 17 g via ORAL
  Filled 2015-12-29 (×4): qty 1

## 2015-12-29 NOTE — Progress Notes (Signed)
Physical Therapy Treatment Patient Details Name: Victoria Bird MRN: [id number] DOB: 1945/11/09 Today's Date: 12/29/2015    History of Present Illness 70 yo female admitted with enteritis. Hx of DM, HTN, COPD, chronic pain, anxiety, OSA. Recent d/c 3/18.     PT Comments    Assisted OOB to amb to bathroom then a greater distance in hallway.  Follow Up Recommendations  No PT follow up;Supervision for mobility/OOB     Equipment Recommendations       Recommendations for Other Services       Precautions / Restrictions Precautions Precautions: Fall    Mobility  Bed Mobility Overal bed mobility: Modified Independent             General bed mobility comments: HOB up 30*  Transfers Overall transfer level: Modified independent Equipment used: None Transfers: Sit to/from Stand Sit to Stand: Supervision         General transfer comment: assisted OOB and to bathroom.  Good use of hands to steady self.   Ambulation/Gait Ambulation/Gait assistance: Supervision Ambulation Distance (Feet): 185 Feet Assistive device: None (IV pole) Gait Pattern/deviations: Step-through pattern     General Gait Details: pushing IV pole   Stairs            Wheelchair Mobility    Modified Rankin (Stroke Patients Only)       Balance                                    Cognition                            Exercises      General Comments        Pertinent Vitals/Pain      Home Living                      Prior Function            PT Goals (current goals can now be found in the care plan section) Progress towards PT goals: Progressing toward goals    Frequency  Min 3X/week    PT Plan Current plan remains appropriate    Co-evaluation             End of Session Equipment Utilized During Treatment: Gait belt Activity Tolerance: Patient tolerated treatment well Patient left: in bed;with call bell/phone within  reach     Time: 1347-1403 PT Time Calculation (min) (ACUTE ONLY): 16 min  Charges:  $Gait Training: 8-22 mins                    G Codes:      Rica Koyanagi  PTA [facility]  Acute  Rehab Pager      [phone]

## 2015-12-29 NOTE — Progress Notes (Signed)
PROGRESS NOTE  RICKEA SCHUTZ [id number] DOB: July 21, 1946 DOA: 12/24/2015 PCP: Hoyt Koch, MD  Victoria Bird is a 70 y.o. female with a past medical history of type 2 diabetes, hypertension, COPD, chronic pain, anxiety, hyperlipidemia, obstructive sleep apnea, GERD who was recently admitted and discharged on 12/18/2015 for COPD exacerbation (refused O2 at that time despite needing it) and returns now to the emergency department with a week history of lower abdominal pain.  She was found to have diverticulitis on CT scan.     Assessment/Plan: Abdominal pain due to diverticulitis Analgesics and antiemetics as needed Cipro/flagyl X ray show moderate stool CT scan done as patient's pain continued and diverticulitis was found Patient  was advanced to soft diet, this a.m. she complains of abdominal pain, KUB with findings of constipation, skilled back her diet to full liquid diet, will observe today will assist in a.m.   Diabetes mellitus ([name]) CBG monitoring with regular insulin sliding scale   Hypertension Hold BP meds due to hypotension   COPD (chronic obstructive pulmonary disease) ([name]) Bronchodilators as needed. smoking cessation advice -last admission qualified for O2 but declined- but sats did not go < 88% upon ambulation   GERD (gastroesophageal reflux disease) Pantoprazole 40 mg by mouth daily.   OSA (obstructive sleep apnea) Per patient, she declined CPAP nocturnal instillation because she is unable to tolerate any thing on her face, like a mask. Agreed to keep nasal cannula oxygen.   Tobacco use disorder Declined nicotine replacement therapy.  Code Status: full Family Communication: patient Disposition Plan:     Consultants:    Procedures:  HPI/Subjective: Eating more Pain decreasing   Objective: Filed Vitals:   12/29/15 0545 12/29/15 0923  BP: 102/51   Pulse: 63 66  Temp: 98.5 F (36.9 C)   Resp: 16 16    Intake/Output  Summary (Last 24 hours) at 12/29/15 1329 Last data filed at 12/29/15 0600  Gross per 24 hour  Intake   1560 ml  Output   1500 ml  Net     60 ml   Filed Weights   12/24/15 2225 12/25/15 0455  Weight: 75.116 kg (165 lb 9.6 oz) 75.438 kg (166 lb 5 oz)    Exam:   General:  Awake, NAD- 2L O2  Cardiovascular: rrr  Respiratory: no wheezing  Abdomen: tender in multiple quadrants  Musculoskeletal:  No edema  Data Reviewed: Basic Metabolic Panel:  Recent Labs Lab 12/24/15 2242 12/26/15 0406 12/27/15 0442 12/28/15 0439  NA 141 140 136 142  K 4.3 4.0 4.0 3.9  CL 102 107 103 107  CO2 29 25 25 27   GLUCOSE 141* 103* 107* 93  BUN 17 11 13 10   CREATININE 1.27* 1.01* 1.19* 1.05*  CALCIUM 9.1 8.2* 8.3* 8.4*   Liver Function Tests:  Recent Labs Lab 12/24/15 2242 12/26/15 0406 12/27/15 0442  AST 28 17 14*  ALT 43 35 26  ALKPHOS 69 55 55  BILITOT 1.1 1.0 0.8  PROT 7.4 5.9* 5.9*  ALBUMIN 3.7 2.8* 2.8*    Recent Labs Lab 12/24/15 2242  LIPASE 34   No results for input(s): AMMONIA in the last 168 hours. CBC:  Recent Labs Lab 12/24/15 2242 12/26/15 0406 12/27/15 0442 12/28/15 0439  WBC 11.6* 11.7* 9.7 6.7  NEUTROABS  --  8.8* 7.4  --   HGB 14.7 12.4 12.1 11.3*  HCT 45.4 37.5 37.0 34.5*  MCV 87.6 88.0 88.1 88.7  PLT 211 190 193 196   Cardiac  Enzymes: No results for input(s): CKTOTAL, CKMB, CKMBINDEX, TROPONINI in the last 168 hours. BNP (last 3 results) No results for input(s): BNP in the last 8760 hours.  ProBNP (last 3 results) No results for input(s): PROBNP in the last 8760 hours.  CBG:  Recent Labs Lab 12/28/15 0757 12/28/15 1257 12/28/15 1604 12/28/15 2143 12/29/15 0757  GLUCAP 93 177* 140* 174* 97    Recent Results (from the past 240 hour(s))  Urine culture     Status: None   Collection Time: 12/24/15 10:57 PM  Result Value Ref Range Status   Specimen Description URINE, CLEAN CATCH  Final   Special Requests NONE  Final   Culture    Final    MULTIPLE SPECIES PRESENT, SUGGEST RECOLLECTION Performed at [facility]    Report Status 12/27/2015 FINAL  Final     Studies: Dg Abd 1 View  12/29/2015  CLINICAL DATA:  Constipation for 2-3 days EXAM: ABDOMEN - 1 VIEW COMPARISON:  None. FINDINGS: Contrast material is noted within the colon from recent CT examination. Fecal material is noted throughout the colon consistent with the given clinical history. No true obstructive changes are seen. No bony abnormality is noted. IMPRESSION: Mild constipation. Electronically Signed   By: Inez Catalina M.D.   On: 12/29/2015 11:22    Scheduled Meds: . ciprofloxacin  400 mg Intravenous Q12H  . enoxaparin (LOVENOX) injection  40 mg Subcutaneous Q24H  . guaiFENesin  1,200 mg Oral BID  . insulin aspart  0-15 Units Subcutaneous TID WC  . ipratropium-albuterol  3 mL Nebulization BID  . metronidazole  500 mg Intravenous Q8H  . pantoprazole  40 mg Oral Daily  . polyethylene glycol  17 g Oral BID  . senna-docusate  2 tablet Oral BID  . tiotropium  18 mcg Inhalation Daily   Continuous Infusions: . sodium chloride 75 mL/hr at 12/28/15 2053   Antibiotics Given (last 72 hours)    Date/Time Action Medication Dose Rate   12/26/15 1528 Given   metroNIDAZOLE (FLAGYL) IVPB 500 mg 500 mg 100 mL/hr   12/26/15 2002 Given   ciprofloxacin (CIPRO) IVPB 400 mg 400 mg 200 mL/hr   12/26/15 2339 Given   metroNIDAZOLE (FLAGYL) IVPB 500 mg 500 mg 100 mL/hr   12/27/15 0744 Given   metroNIDAZOLE (FLAGYL) IVPB 500 mg 500 mg 100 mL/hr   12/27/15 0800 Given   ciprofloxacin (CIPRO) IVPB 400 mg 400 mg 200 mL/hr   12/27/15 1553 Given   metroNIDAZOLE (FLAGYL) IVPB 500 mg 500 mg 100 mL/hr   12/27/15 1908 Given   ciprofloxacin (CIPRO) IVPB 400 mg 400 mg 200 mL/hr   12/28/15 0106 Given   metroNIDAZOLE (FLAGYL) IVPB 500 mg 500 mg 100 mL/hr   12/28/15 0820 Given   metroNIDAZOLE (FLAGYL) IVPB 500 mg 500 mg 100 mL/hr   12/28/15 0820 Given   ciprofloxacin  (CIPRO) IVPB 400 mg 400 mg 200 mL/hr   12/28/15 1546 Given   metroNIDAZOLE (FLAGYL) IVPB 500 mg 500 mg 100 mL/hr   12/28/15 2053 Given   ciprofloxacin (CIPRO) IVPB 400 mg 400 mg 200 mL/hr   12/29/15 0022 Given   metroNIDAZOLE (FLAGYL) IVPB 500 mg 500 mg 100 mL/hr   12/29/15 0840 Given   metroNIDAZOLE (FLAGYL) IVPB 500 mg 500 mg 100 mL/hr   12/29/15 0840 Given   ciprofloxacin (CIPRO) IVPB 400 mg 400 mg 200 mL/hr      Principal Problem:   Enteritis Active Problems:   Diabetes mellitus ([place])   Hypertension  COPD (chronic obstructive pulmonary disease) ([name])   GERD (gastroesophageal reflux disease)   OSA (obstructive sleep apnea)   Hyperlipidemia   Diabetes mellitus type 2, controlled ([place])   Tobacco use disorder    Time spent: 25 min    [facility], Shirleyann Montero  [facility] Pager [phone]. If 7PM-7AM, please contact night-coverage at [email or web address], password [facility] 12/29/2015, 1:29 PM  LOS: 4 days

## 2015-12-30 DIAGNOSIS — E119 Type 2 diabetes mellitus without complications: Secondary | ICD-10-CM

## 2015-12-30 DIAGNOSIS — K5792 Diverticulitis of intestine, part unspecified, without perforation or abscess without bleeding: Secondary | ICD-10-CM

## 2015-12-30 DIAGNOSIS — F17200 Nicotine dependence, unspecified, uncomplicated: Secondary | ICD-10-CM

## 2015-12-30 LAB — CBC
HCT: 35.6 % — ABNORMAL LOW (ref 36.0–46.0)
Hemoglobin: 11.7 g/dL — ABNORMAL LOW (ref 12.0–15.0)
MCH: 28.6 pg (ref 26.0–34.0)
MCHC: 32.9 g/dL (ref 30.0–36.0)
MCV: 87 fL (ref 78.0–100.0)
PLATELETS: 193 10*3/uL (ref 150–400)
RBC: 4.09 MIL/uL (ref 3.87–5.11)
RDW: 13.8 % (ref 11.5–15.5)
WBC: 5.3 10*3/uL (ref 4.0–10.5)

## 2015-12-30 LAB — BASIC METABOLIC PANEL
Anion gap: 7 (ref 5–15)
BUN: 5 mg/dL — AB (ref 6–20)
CO2: 26 mmol/L (ref 22–32)
CREATININE: 0.89 mg/dL (ref 0.44–1.00)
Calcium: 8.4 mg/dL — ABNORMAL LOW (ref 8.9–10.3)
Chloride: 110 mmol/L (ref 101–111)
Glucose, Bld: 105 mg/dL — ABNORMAL HIGH (ref 65–99)
Potassium: 3.9 mmol/L (ref 3.5–5.1)
SODIUM: 143 mmol/L (ref 135–145)

## 2015-12-30 LAB — GLUCOSE, CAPILLARY
GLUCOSE-CAPILLARY: 88 mg/dL (ref 65–99)
GLUCOSE-CAPILLARY: 216 mg/dL — AB (ref 65–99)
Glucose-Capillary: 157 mg/dL — ABNORMAL HIGH (ref 65–99)
Glucose-Capillary: 98 mg/dL (ref 65–99)

## 2015-12-30 MED ORDER — OXYCODONE-ACETAMINOPHEN 5-325 MG PO TABS
1.0000 | ORAL_TABLET | ORAL | Status: DC | PRN
  Administered 2015-12-30 – 2015-12-31 (×3): 1 via ORAL
  Filled 2015-12-30 (×3): qty 1

## 2015-12-30 MED ORDER — POLYETHYLENE GLYCOL 3350 17 G PO PACK
34.0000 g | PACK | Freq: Every day | ORAL | Status: DC
  Administered 2015-12-30: 34 g via ORAL
  Filled 2015-12-30: qty 2

## 2015-12-30 MED ORDER — SORBITOL 70 % SOLN
60.0000 mL | Freq: Once | Status: AC
  Administered 2015-12-30: 60 mL via ORAL
  Filled 2015-12-30: qty 60

## 2015-12-30 NOTE — Progress Notes (Signed)
PROGRESS NOTE  Victoria Bird [id number] DOB: 07-14-1946 DOA: 12/24/2015 PCP: Hoyt Koch, MD  Victoria Bird is a 70 y.o. female with a past medical history of type 2 diabetes, hypertension, COPD, chronic pain, anxiety, hyperlipidemia, obstructive sleep apnea, GERD who was recently admitted and discharged on 12/18/2015 for COPD exacerbation (refused O2 at that time despite needing it) and returns now to the emergency department with a week history of lower abdominal pain.  She was found to have diverticulitis on CT scan.     Assessment/Plan: Abdominal pain due to diverticulitis Analgesics and antiemetics as needed Cipro/flagyl X ray show moderate stool CT scan done as patient's pain continued and diverticulitis was found Patient could not tolerate soft diet yesterday , and complained  of abdominal pain, KUB with findings of constipation, was on full liquid diet yesterday, advance to date soft diet, reports some nausea after eating, I think her symptoms more likely related to constipation, she reports no bowel movement since admission, will continue to monitor today on soft diet, continue with nausea medication.   Diabetes mellitus ([name]) CBG monitoring with regular insulin sliding scale   Hypertension Hold BP meds due to hypotension   COPD (chronic obstructive pulmonary disease) ([name]) Bronchodilators as needed. smoking cessation advice -last admission qualified for O2 but declined- but sats did not go < 88% upon ambulation   GERD (gastroesophageal reflux disease) Pantoprazole 40 mg by mouth daily.   OSA (obstructive sleep apnea) Per patient, she declined CPAP nocturnal instillation because she is unable to tolerate any thing on her face, like a mask. Agreed to keep nasal cannula oxygen.   Tobacco use disorder Declined nicotine replacement therapy.  Constipation - Encouraged to ambulate, started on laxatives.  Code Status: full Family Communication:  patient Disposition Plan:     Consultants:    Procedures:  HPI/Subjective: Reports nausea after lunch, only finished less than 50%, reports no bowel movement yet.   Objective: Filed Vitals:   12/29/15 2146 12/30/15 0400  BP: 144/65 142/70  Pulse: 68 54  Temp: 98.4 F (36.9 C) 98.5 F (36.9 C)  Resp: 16 16    Intake/Output Summary (Last 24 hours) at 12/30/15 1529 Last data filed at 12/30/15 1000  Gross per 24 hour  Intake    600 ml  Output   2400 ml  Net  -1800 ml   Filed Weights   12/24/15 2225 12/25/15 0455  Weight: 75.116 kg (165 lb 9.6 oz) 75.438 kg (166 lb 5 oz)    Exam:   General:  Awake, NAD-   Cardiovascular: rrr  Respiratory: no wheezing  Abdomen: No tenderness, bowel sounds present  Musculoskeletal:  No edema  Data Reviewed: Basic Metabolic Panel:  Recent Labs Lab 12/24/15 2242 12/26/15 0406 12/27/15 0442 12/28/15 0439 12/30/15 0431  NA 141 140 136 142 143  K 4.3 4.0 4.0 3.9 3.9  CL 102 107 103 107 110  CO2 29 25 25 27 26   GLUCOSE 141* 103* 107* 93 105*  BUN 17 11 13 10  5*  CREATININE 1.27* 1.01* 1.19* 1.05* 0.89  CALCIUM 9.1 8.2* 8.3* 8.4* 8.4*   Liver Function Tests:  Recent Labs Lab 12/24/15 2242 12/26/15 0406 12/27/15 0442  AST 28 17 14*  ALT 43 35 26  ALKPHOS 69 55 55  BILITOT 1.1 1.0 0.8  PROT 7.4 5.9* 5.9*  ALBUMIN 3.7 2.8* 2.8*    Recent Labs Lab 12/24/15 2242  LIPASE 34   No results for input(s): AMMONIA in the  last 168 hours. CBC:  Recent Labs Lab 12/24/15 2242 12/26/15 0406 12/27/15 0442 12/28/15 0439 12/30/15 0431  WBC 11.6* 11.7* 9.7 6.7 5.3  NEUTROABS  --  8.8* 7.4  --   --   HGB 14.7 12.4 12.1 11.3* 11.7*  HCT 45.4 37.5 37.0 34.5* 35.6*  MCV 87.6 88.0 88.1 88.7 87.0  PLT 211 190 193 196 193   Cardiac Enzymes: No results for input(s): CKTOTAL, CKMB, CKMBINDEX, TROPONINI in the last 168 hours. BNP (last 3 results) No results for input(s): BNP in the last 8760 hours.  ProBNP (last 3  results) No results for input(s): PROBNP in the last 8760 hours.  CBG:  Recent Labs Lab 12/29/15 1200 12/29/15 1625 12/29/15 2140 12/30/15 0738 12/30/15 1155  GLUCAP 158* 110* 152* 88 157*    Recent Results (from the past 240 hour(s))  Urine culture     Status: None   Collection Time: 12/24/15 10:57 PM  Result Value Ref Range Status   Specimen Description URINE, CLEAN CATCH  Final   Special Requests NONE  Final   Culture   Final    MULTIPLE SPECIES PRESENT, SUGGEST RECOLLECTION Performed at [facility]    Report Status 12/27/2015 FINAL  Final     Studies: Dg Abd 1 View  12/29/2015  CLINICAL DATA:  Constipation for 2-3 days EXAM: ABDOMEN - 1 VIEW COMPARISON:  None. FINDINGS: Contrast material is noted within the colon from recent CT examination. Fecal material is noted throughout the colon consistent with the given clinical history. No true obstructive changes are seen. No bony abnormality is noted. IMPRESSION: Mild constipation. Electronically Signed   By: Inez Catalina M.D.   On: 12/29/2015 11:22    Scheduled Meds: . ciprofloxacin  400 mg Intravenous Q12H  . enoxaparin (LOVENOX) injection  40 mg Subcutaneous Q24H  . guaiFENesin  1,200 mg Oral BID  . insulin aspart  0-15 Units Subcutaneous TID WC  . ipratropium-albuterol  3 mL Nebulization BID  . metronidazole  500 mg Intravenous Q8H  . pantoprazole  40 mg Oral Daily  . polyethylene glycol  34 g Oral Daily  . senna-docusate  2 tablet Oral BID  . sorbitol  60 mL Oral Once  . tiotropium  18 mcg Inhalation Daily   Continuous Infusions: . sodium chloride 75 mL/hr (12/30/15 1156)   Antibiotics Given (last 72 hours)    Date/Time Action Medication Dose Rate   12/27/15 1553 Given   metroNIDAZOLE (FLAGYL) IVPB 500 mg 500 mg 100 mL/hr   12/27/15 1908 Given   ciprofloxacin (CIPRO) IVPB 400 mg 400 mg 200 mL/hr   12/28/15 0106 Given   metroNIDAZOLE (FLAGYL) IVPB 500 mg 500 mg 100 mL/hr   12/28/15 0820 Given    metroNIDAZOLE (FLAGYL) IVPB 500 mg 500 mg 100 mL/hr   12/28/15 0820 Given   ciprofloxacin (CIPRO) IVPB 400 mg 400 mg 200 mL/hr   12/28/15 1546 Given   metroNIDAZOLE (FLAGYL) IVPB 500 mg 500 mg 100 mL/hr   12/28/15 2053 Given   ciprofloxacin (CIPRO) IVPB 400 mg 400 mg 200 mL/hr   12/29/15 0022 Given   metroNIDAZOLE (FLAGYL) IVPB 500 mg 500 mg 100 mL/hr   12/29/15 0840 Given   metroNIDAZOLE (FLAGYL) IVPB 500 mg 500 mg 100 mL/hr   12/29/15 0840 Given   ciprofloxacin (CIPRO) IVPB 400 mg 400 mg 200 mL/hr   12/29/15 1659 Given   metroNIDAZOLE (FLAGYL) IVPB 500 mg 500 mg 100 mL/hr   12/29/15 2057 Given   ciprofloxacin (  CIPRO) IVPB 400 mg 400 mg 200 mL/hr   12/30/15 0010 Given   metroNIDAZOLE (FLAGYL) IVPB 500 mg 500 mg 100 mL/hr   12/30/15 [id number] Given   ciprofloxacin (CIPRO) IVPB 400 mg 400 mg 200 mL/hr   12/30/15 [id number] Given   metroNIDAZOLE (FLAGYL) IVPB 500 mg 500 mg 100 mL/hr      Principal Problem:   Enteritis Active Problems:   Diabetes mellitus ([name])   Hypertension   COPD (chronic obstructive pulmonary disease) ([name])   GERD (gastroesophageal reflux disease)   OSA (obstructive sleep apnea)   Hyperlipidemia   Diabetes mellitus type 2, controlled ([place])   Tobacco use disorder    Time spent: 25 min    Mardene Lessig  [facility] Pager [phone]. If 7PM-7AM, please contact night-coverage at [email or web address], password [facility] 12/30/2015, 3:29 PM  LOS: 5 days

## 2015-12-31 DIAGNOSIS — I10 Essential (primary) hypertension: Secondary | ICD-10-CM

## 2015-12-31 LAB — GLUCOSE, CAPILLARY
Glucose-Capillary: 93 mg/dL (ref 65–99)
Glucose-Capillary: 144 mg/dL — ABNORMAL HIGH (ref 65–99)

## 2015-12-31 MED ORDER — ACETAMINOPHEN 325 MG PO TABS: 650.0000 mg | ORAL_TABLET | ORAL | Status: DC | PRN

## 2015-12-31 MED ORDER — OXYCODONE HCL 5 MG PO TABS: 5.0000 mg | ORAL_TABLET | Freq: Four times a day (QID) | ORAL | Status: DC | PRN

## 2015-12-31 MED ORDER — SACCHAROMYCES BOULARDII 250 MG PO CAPS: 250.0000 mg | ORAL_CAPSULE | Freq: Two times a day (BID) | ORAL | Status: DC

## 2015-12-31 MED ORDER — METRONIDAZOLE 500 MG PO TABS: 500.0000 mg | ORAL_TABLET | Freq: Three times a day (TID) | ORAL | Status: DC

## 2015-12-31 MED ORDER — PANTOPRAZOLE SODIUM 40 MG PO TBEC: 40.0000 mg | DELAYED_RELEASE_TABLET | Freq: Every day | ORAL | Status: DC

## 2015-12-31 MED ORDER — CIPROFLOXACIN HCL 500 MG PO TABS: 500.0000 mg | ORAL_TABLET | Freq: Two times a day (BID) | ORAL | Status: DC

## 2015-12-31 MED ORDER — SENNOSIDES-DOCUSATE SODIUM 8.6-50 MG PO TABS: 1.0000 | ORAL_TABLET | Freq: Two times a day (BID) | ORAL | Status: DC

## 2015-12-31 MED ORDER — POLYETHYLENE GLYCOL 3350 17 G PO PACK: 17.0000 g | PACK | Freq: Every day | ORAL | Status: DC

## 2015-12-31 NOTE — Discharge Summary (Signed)
Victoria Bird, is a 70 y.o. female  DOB 03/27/1946  MRN OF:[id number].  Admission date:  12/24/2015  Admitting Physician  [name], MD  Discharge Date:  12/31/2015   Primary MD  Hoyt Koch, MD  Recommendations for primary care physician for things to follow:  - Please check CBC, BMP during next visit   Admission Diagnosis  abdominal pain   Discharge Diagnosis  abdominal pain    Principal Problem:   Enteritis Active Problems:   Diabetes mellitus ([place])   Hypertension   COPD (chronic obstructive pulmonary disease) ([name])   GERD (gastroesophageal reflux disease)   OSA (obstructive sleep apnea)   Hyperlipidemia   Diabetes mellitus type 2, controlled ([place])   Tobacco use disorder      Past Medical History  Diagnosis Date  . Diabetes mellitus   . Hypertension   . COPD (chronic obstructive pulmonary disease) ([place])   . Chronic pain   . Anxiety   . Tracheobronchitis 01/01/2012  . High cholesterol   . OSA (obstructive sleep apnea)   . H/O: pneumonia   . GERD (gastroesophageal reflux disease)   . Colon polyps     adenomatous    Past Surgical History  Procedure Laterality Date  . Appendectomy    . Cholecystectomy    . Abdominal hysterectomy    . Fracture surgery    . Rotator cuff repair    . Carpal tunnel release Left        History of present illness and  Hospital Course:     Kindly see H&P for history of present illness and admission details, please review complete Labs, Consult reports and Test reports for all details in brief  HPI  from the history and physical done on the day of admission 12/25/2015 HPI: Victoria Bird is a 70 y.o. female with a past medical history of type 2 diabetes, hypertension, COPD, chronic pain, anxiety, hyperlipidemia, obstructive sleep apnea, GERD who was recently admitted and discharged on 12/18/2015 for COPD exacerbation and  returns now to the emergency department with a week history of lower abdominal pain.  Per patient, she has been having generalized weakness, loss of appetite, abdominal pain associated with nausea and multiple episodes of diarrhea daily for about a week. She states that the weakness has gotten so bad that she is unable to walk from her room to her kitchen without significant fatigue and weakness.   When seen in the emergency department, the patient stated that her pain was doing better after she was given analgesics. She was in no acute distress. Workup in the emergency department shows mild leukocytosis, mild elevation in creatinine and mildly abnormal urinalysis.   Hospital Course  Victoria Bird is a 70 y.o. female with a past medical history of type 2 diabetes, hypertension, COPD, chronic pain, anxiety, hyperlipidemia, obstructive sleep apnea, GERD who was recently admitted and discharged on 12/18/2015 for COPD exacerbation (refused O2 at that time despite needing it) and returns now to the emergency department with  a week history of lower abdominal pain. She was found to have diverticulitis on CT scan  Abdominal pain due to diverticulitis - CT scan done as patient's pain continued and diverticulitis was found, she was treated with IV ciprofloxacin and azithromycin, afebrile, no leukocytosis, abdominal pain resolved, and diet advanced gradually, has been tolerating soft diet for last 24 hours, she had couple occasions with abdominal pain after advancing diet, but I think this was more related to her constipation, has significant improvement after bowel movement with laxative.    Diabetes mellitus ([place]) - Discharged on home medication   Hypertension - Discharged on home medication   COPD (chronic obstructive pulmonary disease) ([name]) Bronchodilators as needed. smoking cessation advice -last admission qualified for O2 but declined- but sats did not go < 88% upon ambulation   GERD  (gastroesophageal reflux disease) Pantoprazole 40 mg by mouth daily.   OSA (obstructive sleep apnea) Per patient, she declined CPAP nocturnal instillation because she is unable to tolerate any thing on her face, like a mask. Agreed to keep nasal cannula oxygen.   Tobacco use disorder Declined nicotine replacement therapy.  Constipation - Encouraged to ambulate, started on laxatives. - Agent with few bowel movements yesterday   Discharge Condition:   Stable  Follow UP  Follow-up Information    Follow up with Hoyt Koch, MD. Schedule an appointment as soon as possible for a visit in 1 week.   Specialty:  Internal Medicine   Why:  Posthospitalization follow-up   Contact information:   [place]  [phone]         Discharge Instructions  and  Discharge Medications     Discharge Instructions    Discharge instructions    Complete by:  As directed   Follow with Primary MD Hoyt Koch, MD in 7 days   Get CBC, CMP, 2 view Chest X ray checked  by Primary MD next visit.    Activity: As tolerated with Full fall precautions use walker/cane & assistance as needed   Disposition Home    Diet: Heart Healthy , carbohydrate modified, salt diet , with feeding assistance and aspiration precautions.  For Heart failure patients - Check your Weight same time everyday, if you gain over 2 pounds, or you develop in leg swelling, experience more shortness of breath or chest pain, call your Primary MD immediately. Follow Cardiac Low Salt Diet and 1.5 lit/day fluid restriction.   On your next visit with your primary care physician please Get Medicines reviewed and adjusted.   Please request your Prim.MD to go over all Hospital Tests and Procedure/Radiological results at the follow up, please get all Hospital records sent to your Prim MD by signing hospital release before you go home.   If you experience worsening of your admission  symptoms, develop shortness of breath, life threatening emergency, suicidal or homicidal thoughts you must seek medical attention immediately by calling 911 or calling your MD immediately  if symptoms less severe.  You Must read complete instructions/literature along with all the possible adverse reactions/side effects for all the Medicines you take and that have been prescribed to you. Take any new Medicines after you have completely understood and accpet all the possible adverse reactions/side effects.   Do not drive, operating heavy machinery, perform activities at heights, swimming or participation in water activities or provide baby sitting services if your were admitted for syncope or siezures until you have seen by Primary MD or a Neurologist  and advised to do so again.  Do not drive when taking Pain medications.    Do not take more than prescribed Pain, Sleep and Anxiety Medications  Special Instructions: If you have smoked or chewed Tobacco  in the last 2 yrs please stop smoking, stop any regular Alcohol  and or any Recreational drug use.  Wear Seat belts while driving.   Please note  You were cared for by a hospitalist during your hospital stay. If you have any questions about your discharge medications or the care you received while you were in the hospital after you are discharged, you can call the unit and asked to speak with the hospitalist on call if the hospitalist that took care of you is not available. Once you are discharged, your primary care physician will handle any further medical issues. Please note that NO REFILLS for any discharge medications will be authorized once you are discharged, as it is imperative that you return to your primary care physician (or establish a relationship with a primary care physician if you do not have one) for your aftercare needs so that they can reassess your need for medications and monitor your lab values.     Increase activity slowly     Complete by:  As directed             Medication List    TAKE these medications        acetaminophen 325 MG tablet  Commonly known as:  TYLENOL  Take 2 tablets (650 mg total) by mouth every 4 (four) hours as needed for mild pain, fever or headache.     albuterol 108 (90 Base) MCG/ACT inhaler  Commonly known as:  PROVENTIL HFA;VENTOLIN HFA  Inhale 1-2 puffs into the lungs every 4 (four) hours as needed. For shortness of breath.     ALPRAZolam 0.5 MG tablet  Commonly known as:  XANAX  Take 1 tablet (0.5 mg total) by mouth at bedtime as needed for anxiety.     benzonatate 100 MG capsule  Commonly known as:  TESSALON  Take 1 capsule (100 mg total) by mouth 3 (three) times daily as needed for cough.     ciprofloxacin 500 MG tablet  Commonly known as:  CIPRO  Take 1 tablet (500 mg total) by mouth 2 (two) times daily.     guaiFENesin 600 MG 12 hr tablet  Commonly known as:  MUCINEX  Take 2 tablets (1,200 mg total) by mouth 2 (two) times daily.     ipratropium-albuterol 0.5-2.5 (3) MG/3ML Soln  Commonly known as:  DUONEB  Take 3 mLs by nebulization 2 (two) times daily.     losartan-hydrochlorothiazide 100-12.5 MG tablet  Commonly known as:  HYZAAR  Take 1 tablet by mouth daily.     metFORMIN 500 MG tablet  Commonly known as:  GLUCOPHAGE  Take 500 mg by mouth 2 (two) times daily with a meal.     metroNIDAZOLE 500 MG tablet  Commonly known as:  FLAGYL  Take 1 tablet (500 mg total) by mouth 3 (three) times daily.     oxyCODONE 5 MG immediate release tablet  Commonly known as:  ROXICODONE  Take 1 tablet (5 mg total) by mouth every 6 (six) hours as needed for severe pain.     pantoprazole 40 MG tablet  Commonly known as:  PROTONIX  Take 1 tablet (40 mg total) by mouth daily.     polyethylene glycol packet  Commonly known as:  MIRALAX /  GLYCOLAX  Take 17 g by mouth daily.     saccharomyces boulardii 250 MG capsule  Commonly known as:  FLORASTOR  Take 1 capsule (250  mg total) by mouth 2 (two) times daily.     senna-docusate 8.6-50 MG tablet  Commonly known as:  Senokot-S  Take 1 tablet by mouth 2 (two) times daily.     tiotropium 18 MCG inhalation capsule  Commonly known as:  SPIRIVA  Place 1 capsule (18 mcg total) into inhaler and inhale daily.      ASK your doctor about these medications        meclizine 25 MG tablet  Commonly known as:  ANTIVERT  Take 1 tablet (25 mg total) by mouth 3 (three) times daily as needed.          Diet and Activity recommendation: See Discharge Instructions above   Consults obtained -  none   Major procedures and Radiology Reports - PLEASE review detailed and final reports for all details, in brief -      Dg Chest 2 View  12/12/2015  CLINICAL DATA:  Shortness of breath, cough, weakness, fever EXAM: CHEST  2 VIEW COMPARISON:  11/17/2015 FINDINGS: Lungs are clear.  No pleural effusion or pneumothorax. The heart is normal in size. Degenerative changes of the visualized thoracolumbar spine. IMPRESSION: No evidence of acute cardiopulmonary disease. Electronically Signed   By: Julian Hy M.D.   On: 12/12/2015 08:41   Dg Abd 1 View  12/29/2015  CLINICAL DATA:  Constipation for 2-3 days EXAM: ABDOMEN - 1 VIEW COMPARISON:  None. FINDINGS: Contrast material is noted within the colon from recent CT examination. Fecal material is noted throughout the colon consistent with the given clinical history. No true obstructive changes are seen. No bony abnormality is noted. IMPRESSION: Mild constipation. Electronically Signed   By: Inez Catalina M.D.   On: 12/29/2015 11:22   Ct Abdomen Pelvis W Contrast  12/27/2015  CLINICAL DATA:  70 year old female with abdominal pain. EXAM: CT ABDOMEN AND PELVIS WITH CONTRAST TECHNIQUE: Multidetector CT imaging of the abdomen and pelvis was performed using the standard protocol following bolus administration of intravenous contrast. CONTRAST:  58mL ISOVUE-300 IOPAMIDOL (ISOVUE-300)  INJECTION 61%, 113mL ISOVUE-300 IOPAMIDOL (ISOVUE-300) INJECTION 61% COMPARISON:  Radiograph dated 12/25/2015 FINDINGS: The visualized lung bases are clear. No intra-abdominal free air or free fluid. Cholecystectomy. The liver, pancreas, spleen, adrenal glands appear unremarkable. There is mild bilateral renal atrophy. There is minimal fullness of the right renal collecting system. There is no hydronephrosis on either side. The urinary bladder is distended. Hysterectomy. There is sigmoid diverticulosis. There is focal inflammatory changes of the sigmoid diverticula compatible with diverticulitis. No extraluminal air or evidence of diverticular abscess. There are scattered colonic diverticula. There is no evidence of bowel obstruction. Appendectomy. There is aortoiliac atherosclerotic disease. There is a 3.8 cm infrarenal abdominal aortic aneurysm with the penetrating ulcer. The origins of the celiac axis, SMA, IMA as well as the origins of the renal arteries are patent. No portal venous gas identified. There is no adenopathy. The abdominal wall soft tissues appear unremarkable. There is osteopenia with degenerative changes of the spine. No acute fracture. IMPRESSION: Sigmoid diverticulitis.  No abscess or perforation. Extensive atheromatous plaque along the aorta with a 3.8 cm infrarenal abdominal aortic aneurysm. Recommend followup by ultrasound in 2 years. This recommendation follows ACR consensus guidelines: White Paper of the ACR Incidental Findings Committee II on Vascular Findings. J Am Coll Radiol 2013; 10:789-794. Electronically Signed  By: Anner Crete M.D.   On: 12/27/2015 01:12   Dg Abd Portable 1v  12/25/2015  CLINICAL DATA:  Abdominal pain, nausea and vomiting EXAM: PORTABLE ABDOMEN - 1 VIEW COMPARISON:  None. FINDINGS: There is normal small bowel gas pattern. Moderate stool noted in right colon. Moderate gas noted within transverse colon. No small bowel air-fluid levels. IMPRESSION: Normal  small bowel gas pattern. Moderate stool in right colon. Moderate colonic gas in transverse colon. Electronically Signed   By: Lahoma Crocker M.D.   On: 12/25/2015 11:23    Micro Results    Recent Results (from the past 240 hour(s))  Urine culture     Status: None   Collection Time: 12/24/15 10:57 PM  Result Value Ref Range Status   Specimen Description URINE, CLEAN CATCH  Final   Special Requests NONE  Final   Culture   Final    MULTIPLE SPECIES PRESENT, SUGGEST RECOLLECTION Performed at [facility]    Report Status 12/27/2015 FINAL  Final       Today   Subjective:   Victoria Bird today has no headache,no chest abdominal pain,no new weakness tingling or numbness, feels much better today.  Objective:   Blood pressure 145/48, pulse 57, temperature 97.7 F (36.5 C), temperature source Oral, resp. rate 16, height 5\' 3"  (1.6 m), weight 75.438 kg (166 lb 5 oz), SpO2 92 %.   Intake/Output Summary (Last 24 hours) at 12/31/15 1144 Last data filed at 12/31/15 1000  Gross per 24 hour  Intake 5076.25 ml  Output   3300 ml  Net 1776.25 ml    Exam Awake Alert, Oriented x 3, No new F.N deficits, Normal affect [place].AT,PERRAL Supple Neck,No JVD, No cervical lymphadenopathy appriciated.  Symmetrical Chest wall movement, Good air movement bilaterally, CTAB RRR,No Gallops,Rubs or new Murmurs, No Parasternal Heave +ve B.Sounds, Abd Soft, Non tender, No organomegaly appriciated, No rebound -guarding or rigidity. No Cyanosis, Clubbing or edema, No new Rash or bruise  Data Review   CBC w Diff: Lab Results  Component Value Date   WBC 5.3 12/30/2015   HGB 11.7* 12/30/2015   HCT 35.6* 12/30/2015   PLT 193 12/30/2015   LYMPHOPCT 14 12/27/2015   MONOPCT 10 12/27/2015   EOSPCT 1 12/27/2015   BASOPCT 0 12/27/2015    CMP: Lab Results  Component Value Date   NA 143 12/30/2015   K 3.9 12/30/2015   CL 110 12/30/2015   CO2 26 12/30/2015   BUN 5* 12/30/2015   CREATININE 0.89  12/30/2015   PROT 5.9* 12/27/2015   ALBUMIN 2.8* 12/27/2015   BILITOT 0.8 12/27/2015   ALKPHOS 55 12/27/2015   AST 14* 12/27/2015   ALT 26 12/27/2015  .   Total Time in preparing paper work, data evaluation and todays exam - 35 minutes  ELGERGAWY, DAWOOD M.D on 12/31/2015 at 11:44 AM  [facility]   Office  [phone]

## 2015-12-31 NOTE — Progress Notes (Signed)
Vitals WNL.  Diet tolerated, Pain under control.  Discharge instructions discussed with patient, patient discharged with prescriptions. All concerns addressed

## 2015-12-31 NOTE — Discharge Instructions (Signed)
Follow with Primary MD Hoyt Koch, MD in 7 days   Get CBC, CMP, 2 view Chest X ray checked  by Primary MD next visit.    Activity: As tolerated with Full fall precautions use walker/cane & assistance as needed   Disposition Home    Diet: Heart Healthy , carbohydrate modified, salt diet , with feeding assistance and aspiration precautions.  For Heart failure patients - Check your Weight same time everyday, if you gain over 2 pounds, or you develop in leg swelling, experience more shortness of breath or chest pain, call your Primary MD immediately. Follow Cardiac Low Salt Diet and 1.5 lit/day fluid restriction.   On your next visit with your primary care physician please Get Medicines reviewed and adjusted.   Please request your Prim.MD to go over all Hospital Tests and Procedure/Radiological results at the follow up, please get all Hospital records sent to your Prim MD by signing hospital release before you go home.   If you experience worsening of your admission symptoms, develop shortness of breath, life threatening emergency, suicidal or homicidal thoughts you must seek medical attention immediately by calling 911 or calling your MD immediately  if symptoms less severe.  You Must read complete instructions/literature along with all the possible adverse reactions/side effects for all the Medicines you take and that have been prescribed to you. Take any new Medicines after you have completely understood and accpet all the possible adverse reactions/side effects.   Do not drive, operating heavy machinery, perform activities at heights, swimming or participation in water activities or provide baby sitting services if your were admitted for syncope or siezures until you have seen by Primary MD or a Neurologist and advised to do so again.  Do not drive when taking Pain medications.    Do not take more than prescribed Pain, Sleep and Anxiety Medications  Special Instructions: If  you have smoked or chewed Tobacco  in the last 2 yrs please stop smoking, stop any regular Alcohol  and or any Recreational drug use.  Wear Seat belts while driving.   Please note  You were cared for by a hospitalist during your hospital stay. If you have any questions about your discharge medications or the care you received while you were in the hospital after you are discharged, you can call the unit and asked to speak with the hospitalist on call if the hospitalist that took care of you is not available. Once you are discharged, your primary care physician will handle any further medical issues. Please note that NO REFILLS for any discharge medications will be authorized once you are discharged, as it is imperative that you return to your primary care physician (or establish a relationship with a primary care physician if you do not have one) for your aftercare needs so that they can reassess your need for medications and monitor your lab values.

## 2016-01-04 ENCOUNTER — Ambulatory Visit: Payer: Medicare Other | Admitting: Internal Medicine

## 2016-01-18 ENCOUNTER — Telehealth: Payer: Self-pay | Admitting: Pulmonary Disease

## 2016-01-18 MED ORDER — LOSARTAN POTASSIUM-HCTZ 100-12.5 MG PO TABS: 1.0000 | ORAL_TABLET | Freq: Every day | ORAL | Status: DC

## 2016-01-18 NOTE — Telephone Encounter (Signed)
Can refill x 1 month . Marland Kitchen Need follow up with PCP to get future refills

## 2016-01-18 NOTE — Telephone Encounter (Signed)
Patient calling to get refill on Losartan/HCTZ 100/12.5mg  QD.  Patient has been in the hospital off and on for 2 weeks and is in between doctors.  She had an appointment with Dr. Sharlet Salina, but was cancelled since she was in hospital.  She has not seen Dr. Sharlet Salina yet.  Wanted to know if Dr. Ashok Cordia would refill this medication for her.  Advised her that Dr. Ashok Cordia is off until next week, she said that she is completely out of medication and has been out of medicine for 2 days now.   Pharmacy: [facility] - please advise in JN's absence.

## 2016-01-18 NOTE — Telephone Encounter (Signed)
Rx sent to [name].  Pt aware and is aware future refills will need to come from PCP.  She verbalized understanding, was very appreciative, and voiced no further questions or concerns at this time.

## 2016-01-21 ENCOUNTER — Other Ambulatory Visit (INDEPENDENT_AMBULATORY_CARE_PROVIDER_SITE_OTHER): Payer: Medicare Other

## 2016-01-21 ENCOUNTER — Telehealth: Payer: Self-pay | Admitting: *Deleted

## 2016-01-21 DIAGNOSIS — E041 Nontoxic single thyroid nodule: Secondary | ICD-10-CM | POA: Diagnosis not present

## 2016-01-21 LAB — TSH: TSH: 2.21 u[IU]/mL (ref 0.35–4.50)

## 2016-01-21 NOTE — Telephone Encounter (Signed)
Dr. Dwyane Dee does not see her for her Diabetes, he only sees her for her Thyroid Nodule, we have never filled her metformin rx and are unable to send it in now.

## 2016-01-21 NOTE — Telephone Encounter (Signed)
Dr Sharlet Salina this pt has schedule a new appt with you 02/25/16 she is needing a refill on her metformin. Not sure who rx medication for her, and she is requesting med to be refill...Johny Chess

## 2016-01-21 NOTE — Telephone Encounter (Signed)
Notified pt with MD response. Pt state her old PCP cancel all her refills and will not refill. She had appt w/dr. Sharlet Salina but had to cancel because she was in the hospital. She stastes she only have 3 days left, and if don't want to ended back up in the hospital. Inform pt we can make her an appt just for a med refill w/NP until she is able to see dr. Sharlet Salina. Made appt for 01/25/16...Johny Chess

## 2016-01-21 NOTE — Telephone Encounter (Signed)
Receive call pt states she need refill on her Metformin. She is schedule to see Dr. Sharlet Salina 02/25/16, but needing refill. Since pt hasn't establish w/Dr. Sharlet Salina yet pt must ger diabetic medications from Dr. Dwyane Dee since he is her specialist. Forwarding msg to Endo...Johny Chess

## 2016-01-21 NOTE — Telephone Encounter (Signed)
Since she has never seen anyone at our office we cannot send in any medicines until visit. She would need to contact her previous provider or be seen at an urgent care for refills until visit.

## 2016-01-21 NOTE — Telephone Encounter (Signed)
She also should not be listed as me as PCP until she is actually seen for new patient visit. Will remove for now.

## 2016-01-25 ENCOUNTER — Encounter: Payer: Self-pay | Admitting: Family

## 2016-01-25 ENCOUNTER — Ambulatory Visit (INDEPENDENT_AMBULATORY_CARE_PROVIDER_SITE_OTHER): Payer: Medicare Other | Admitting: Family

## 2016-01-25 VITALS — BP 148/86 | HR 86 | Temp 97.4°F | Resp 18 | Ht 63.0 in | Wt 169.0 lb

## 2016-01-25 DIAGNOSIS — F17200 Nicotine dependence, unspecified, uncomplicated: Secondary | ICD-10-CM

## 2016-01-25 DIAGNOSIS — J441 Chronic obstructive pulmonary disease with (acute) exacerbation: Secondary | ICD-10-CM

## 2016-01-25 DIAGNOSIS — J410 Simple chronic bronchitis: Secondary | ICD-10-CM

## 2016-01-25 DIAGNOSIS — E663 Overweight: Secondary | ICD-10-CM

## 2016-01-25 DIAGNOSIS — E0800 Diabetes mellitus due to underlying condition with hyperosmolarity without nonketotic hyperglycemic-hyperosmolar coma (NKHHC): Secondary | ICD-10-CM | POA: Diagnosis not present

## 2016-01-25 DIAGNOSIS — I10 Essential (primary) hypertension: Secondary | ICD-10-CM

## 2016-01-25 MED ORDER — METFORMIN HCL 500 MG PO TABS: 500.0000 mg | ORAL_TABLET | Freq: Two times a day (BID) | ORAL | Status: DC

## 2016-01-25 MED ORDER — ALBUTEROL SULFATE HFA 108 (90 BASE) MCG/ACT IN AERS: 1.0000 | INHALATION_SPRAY | RESPIRATORY_TRACT | Status: DC | PRN

## 2016-01-25 NOTE — Assessment & Plan Note (Signed)
Diabetes appears well controlled with current regimen of metformin. Diabetic foot exam completed. Maintained on losartan for CAD risk reduction. Encouraged to complete her diabetic eye exam. Continue current dosage of metformin. Follow up with Dr. Dwyane Dee as scheduled.

## 2016-01-25 NOTE — Assessment & Plan Note (Signed)
COPD appears adequately controlled with the current regimen. Emphasized the importance of tobacco cessation to slow progression. Indicates that she is contemplating tobacco cessation. Continue current dosage of albuterol, Duoneb, and tiotropium as prescribed. Follow and changes per Pulmonology as needed/scheduled.

## 2016-01-25 NOTE — Assessment & Plan Note (Signed)
Blood pressure slightly elevated above goal of 140/90 with current regimen. May require additional medication to lower blood pressure below goal for CAD risk reduction. Continue current losartan-hydrochlorothiazide. Encouraged to monitor blood pressure at home, reduce sodium intake and increase nutrient dense foods. Follow up with new PCP and cardiology as scheduled.

## 2016-01-25 NOTE — Patient Instructions (Signed)
Thank you for choosing [facility].  Summary/Instructions:  Please continue to take your medications as prescribed.   Follow up with Dr. Sharlet Salina to establish care.   Your prescription(s) have been submitted to your pharmacy or been printed and provided for you. Please take as directed and contact our office if you believe you are having problem(s) with the medication(s) or have any questions.  If your symptoms worsen or fail to improve, please contact our office for further instruction, or in case of emergency go directly to the emergency room at the closest medical facility.

## 2016-01-25 NOTE — Assessment & Plan Note (Signed)
Emphasize importance of tobacco cessation to assist in disease reduction and progression of her COPD and possible cardiovascular and additional respiratory pathology in the future.

## 2016-01-25 NOTE — Progress Notes (Signed)
Pre visit review using our clinic review tool, if applicable. No additional management support is needed unless otherwise documented below in the visit note. 

## 2016-01-25 NOTE — Progress Notes (Signed)
Subjective:    Patient ID: Victoria Bird, female    DOB: 17-Nov-1945, 70 y.o.   MRN: [id number]  Chief Complaint  Patient presents with  . Medication Refill    HPI:  Victoria Bird is a 70 y.o. female who  has a past medical history of Diabetes mellitus; Hypertension; COPD (chronic obstructive pulmonary disease) ([place]); Chronic pain; Anxiety; Tracheobronchitis (01/01/2012); High cholesterol; OSA (obstructive sleep apnea); H/O: pneumonia; GERD (gastroesophageal reflux disease); and Colon polyps. and presents today for an office visit.   1.) Diabetes - Currently maintained on metformin. Reports taking the medication as prescribed and denies adverse side effects. Previous A1c noted to be 5.8 in March 2017. Occasional blurred vision. No other symptoms of end organ damage. Due for an eye exam and foot exam. Diabetes has been managed by Dr. Dwyane Dee of [facility].  Lab Results  Component Value Date   HGBA1C 5.8* 12/13/2015    2.) COPD - Currently maintained on albuterol and tiotropium as well as Duoneb nebulizer as needed. Reports taking the medication as prescribed and denies adverse side effects. She is currently managed through [facility]. Notes that her symptoms are adequately controlled with the current regimen. She does continue her tobacco use.   3.) Hypertension - Currently maintained on losartan-hydrochlorothiazide. Reports taking the medication as prescribed and denies adverse side effects. Does have blurred vision and is due for an eye exam.   Allergies  Allergen Reactions  . Aspartame And Phenylalanine Nausea And Vomiting    Patient says she is allergic to all artificial sweeteners  . Doxycycline Other (See Comments)    Nausea, vomiting, HA, double vision  . Tramadol Shortness Of Breath and Nausea Only  . Codeine Itching    Has not tried benadryl to alleviate side effects  . Sulfa Antibiotics Other (See Comments)    Kidney problem   . Penicillins Rash      Has patient had a PCN reaction causing immediate rash, facial/tongue/throat swelling, SOB or lightheadedness with hypotension: No Has patient had a PCN reaction causing severe rash involving mucus membranes or skin necrosis: No Has patient had a PCN reaction that required hospitalization No Has patient had a PCN reaction occurring within the last 10 years: No If all of the above answers are "NO", then may proceed with Cephalosporin use.  . Prednisone Rash    Patient stated she received Prednisone while she was in the hospital and experienced a rash and "extreme pain.'     Outpatient Prescriptions Prior to Visit  Medication Sig Dispense Refill  . ALPRAZolam (XANAX) 0.5 MG tablet Take 1 tablet (0.5 mg total) by mouth at bedtime as needed for anxiety. 30 tablet 0  . benzonatate (TESSALON) 100 MG capsule Take 1 capsule (100 mg total) by mouth 3 (three) times daily as needed for cough. 30 capsule 0  . ipratropium-albuterol (DUONEB) 0.5-2.5 (3) MG/3ML SOLN Take 3 mLs by nebulization 2 (two) times daily. 360 mL 1  . losartan-hydrochlorothiazide (HYZAAR) 100-12.5 MG tablet Take 1 tablet by mouth daily. Please defer future refills to PCP 30 tablet 0  . oxyCODONE (ROXICODONE) 5 MG immediate release tablet Take 1 tablet (5 mg total) by mouth every 6 (six) hours as needed for severe pain. 10 tablet 0  . pantoprazole (PROTONIX) 40 MG tablet Take 1 tablet (40 mg total) by mouth daily. 30 tablet 0  . polyethylene glycol (MIRALAX / GLYCOLAX) packet Take 17 g by mouth daily. 14 each 0  . senna-docusate (SENOKOT-S)  8.6-50 MG tablet Take 1 tablet by mouth 2 (two) times daily. 30 tablet 0  . tiotropium (SPIRIVA) 18 MCG inhalation capsule Place 1 capsule (18 mcg total) into inhaler and inhale daily. 30 capsule 0  . acetaminophen (TYLENOL) 325 MG tablet Take 2 tablets (650 mg total) by mouth every 4 (four) hours as needed for mild pain, fever or headache.    . albuterol (PROVENTIL HFA;VENTOLIN HFA) 108 (90 BASE)  MCG/ACT inhaler Inhale 1-2 puffs into the lungs every 4 (four) hours as needed. For shortness of breath. 1 Inhaler 3  . ciprofloxacin (CIPRO) 500 MG tablet Take 1 tablet (500 mg total) by mouth 2 (two) times daily. 8 tablet 0  . guaiFENesin (MUCINEX) 600 MG 12 hr tablet Take 2 tablets (1,200 mg total) by mouth 2 (two) times daily. 60 tablet 0  . metFORMIN (GLUCOPHAGE) 500 MG tablet Take 500 mg by mouth 2 (two) times daily with a meal.     . metroNIDAZOLE (FLAGYL) 500 MG tablet Take 1 tablet (500 mg total) by mouth 3 (three) times daily. 12 tablet 0  . saccharomyces boulardii (FLORASTOR) 250 MG capsule Take 1 capsule (250 mg total) by mouth 2 (two) times daily. 30 capsule 0   No facility-administered medications prior to visit.     Past Medical History  Diagnosis Date  . Diabetes mellitus   . Hypertension   . COPD (chronic obstructive pulmonary disease) ([place])   . Chronic pain   . Anxiety   . Tracheobronchitis 01/01/2012  . High cholesterol   . OSA (obstructive sleep apnea)   . H/O: pneumonia   . GERD (gastroesophageal reflux disease)   . Colon polyps     adenomatous     Past Surgical History  Procedure Laterality Date  . Appendectomy    . Cholecystectomy    . Abdominal hysterectomy    . Rotator cuff repair    . Carpal tunnel release Left   . Fracture surgery    . Bilateral shoulder surgery    . Right knee surgery       Family History  Problem Relation Age of Onset  . Heart attack Mother   . Diabetes Maternal Grandfather   . Heart attack Maternal Grandfather   . Hypertension Maternal Grandfather   . Stroke Maternal Grandfather   . Thyroid disease Neg Hx   . Lung disease Neg Hx   . Heart attack Maternal Grandmother   . Hypertension Maternal Grandmother   . Alcohol abuse Father      Social History   Social History  . Marital Status: Divorced    Spouse Name: N/A  . Number of Children: 2  . Years of Education: GED   Occupational History  . Disability     Social History Main Topics  . Smoking status: Current Every Day Smoker -- 0.50 packs/day for 41 years    Types: Cigarettes    Start date: 03/19/1974  . Smokeless tobacco: Never Used     Comment: Peak rate of 1.5ppd  . Alcohol Use: No  . Drug Use: No  . Sexual Activity: No   Other Topics Concern  . Not on file   Social History Narrative   Daughter Edmonia Lynch) [phone]   Originally from [place]. Previously lived in [place]. She has traveled from [facility] to [place] & has also traveled across country to [place]. Multiple visits to [place], [place], [place], [place], [place]. She has a dog at home. No bird, mold, or hot tub exposure.  She reports some years ago she did live in a home that had black mold in her closet. 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       Pt lives with her youngest daughter, lives in a trailer, does have stairs into home. Does have some troubled with that. Highest level of education is GED.      Review of Systems  Constitutional: Negative for fever and chills.  Eyes:       Describes some blurred vision.  Respiratory: Negative for cough, chest tightness and wheezing.   Cardiovascular: Negative for chest pain, palpitations and leg swelling.  Endocrine: Negative for polydipsia, polyphagia and polyuria.  Neurological: Negative for dizziness, weakness, light-headedness and numbness.      Objective:    BP 148/86 mmHg  Pulse 86  Temp(Src) 97.4 F (36.3 C) (Oral)  Resp 18  Ht 5\' 3"  (1.6 m)  Wt 169 lb (76.658 kg)  BMI 29.94 kg/m2  SpO2 96% Nursing note and vital signs reviewed.  Physical Exam  Constitutional: She is oriented to person, place, and time. She appears well-developed and well-nourished. No distress.  Cardiovascular: Normal rate, regular rhythm, normal heart sounds and intact distal pulses.   Pulmonary/Chest: Effort normal and breath  sounds normal.  Neurological: She is alert and oriented to person, place, and time.  Diabetic foot exam - bilateral feet are free from skin breakdown, cuts, and abrasions. Toenails are neatly trimmed. Pulses are intact and appropriate. Sensation is intact to monofilament bilaterally. There is numbness and tingling diffusely throughout her feet.   Skin: Skin is warm and dry.  Psychiatric: She has a normal mood and affect. Her behavior is normal. Judgment and thought content normal.       Assessment & Plan:   Problem List Items Addressed This Visit      Cardiovascular and Mediastinum   Hypertension    Blood pressure slightly elevated above goal of 140/90 with current regimen. May require additional medication to lower blood pressure below goal for CAD risk reduction. Continue current losartan-hydrochlorothiazide. Encouraged to monitor blood pressure at home, reduce sodium intake and increase nutrient dense foods. Follow up with new PCP and cardiology as scheduled.         Respiratory   COPD (chronic obstructive pulmonary disease) ([name])    COPD appears adequately controlled with the current regimen. Emphasized the importance of tobacco cessation to slow progression. Indicates that she is contemplating tobacco cessation. Continue current dosage of albuterol, Duoneb, and tiotropium as prescribed. Follow and changes per Pulmonology as needed/scheduled.       Relevant Medications   albuterol (PROVENTIL HFA;VENTOLIN HFA) 108 (90 Base) MCG/ACT inhaler   COPD exacerbation ([name])   Relevant Medications   albuterol (PROVENTIL HFA;VENTOLIN HFA) 108 (90 Base) MCG/ACT inhaler     Endocrine   Diabetes mellitus ([place]) - Primary    Diabetes appears well controlled with current regimen of metformin. Diabetic foot exam completed. Maintained on losartan for CAD risk reduction. Encouraged to complete her diabetic eye exam. Continue current dosage of metformin. Follow up with Dr. Dwyane Dee as scheduled.        Relevant Medications   metFORMIN (GLUCOPHAGE) 500 MG tablet     Other   Tobacco use disorder    Emphasize importance of tobacco cessation to assist in disease reduction and progression of her COPD and possible cardiovascular and additional respiratory pathology  in the future.      Overweight (BMI 25.0-29.9)    Recommend increasing physical activity to 30 minutes of moderate level activity daily. Encourage nutritional intake that focuses on nutrient dense foods and is moderate, varied, and balanced and is low in saturated fats and processed/sugary foods. Goal weight loss of approximately 5-10% of current body weight.           I have discontinued Ms. Goughnour's guaiFENesin, acetaminophen, ciprofloxacin, metroNIDAZOLE, and saccharomyces boulardii. I have also changed her metFORMIN. Additionally, I am having her maintain her benzonatate, ipratropium-albuterol, tiotropium, ALPRAZolam, pantoprazole, polyethylene glycol, senna-docusate, oxyCODONE, losartan-hydrochlorothiazide, and albuterol.   Meds ordered this encounter  Medications  . metFORMIN (GLUCOPHAGE) 500 MG tablet    Sig: Take 1 tablet (500 mg total) by mouth 2 (two) times daily with a meal.    Dispense:  60 tablet    Refill:  2  . albuterol (PROVENTIL HFA;VENTOLIN HFA) 108 (90 Base) MCG/ACT inhaler    Sig: Inhale 1-2 puffs into the lungs every 4 (four) hours as needed. For shortness of breath.    Dispense:  1 Inhaler    Refill:  3     Follow-up: Return in about 1 month (around 02/24/2016).  Mauricio Po, FNP

## 2016-01-25 NOTE — Assessment & Plan Note (Signed)
Recommend increasing physical activity to 30 minutes of moderate level activity daily. Encourage nutritional intake that focuses on nutrient dense foods and is moderate, varied, and balanced and is low in saturated fats and processed/sugary foods. Goal weight loss of approximately 5-10% of current body weight.

## 2016-01-26 ENCOUNTER — Ambulatory Visit (INDEPENDENT_AMBULATORY_CARE_PROVIDER_SITE_OTHER): Payer: Medicare Other | Admitting: Endocrinology

## 2016-01-26 ENCOUNTER — Encounter: Payer: Self-pay | Admitting: Endocrinology

## 2016-01-26 VITALS — BP 128/84 | HR 85 | Temp 97.7°F | Resp 14 | Ht 63.0 in | Wt 170.0 lb

## 2016-01-26 DIAGNOSIS — E041 Nontoxic single thyroid nodule: Secondary | ICD-10-CM

## 2016-01-26 NOTE — Progress Notes (Signed)
Patient ID: Victoria Bird, female   DOB: Jan 25, 1946, 70 y.o.   MRN: [id number]    Reason for Appointment: Follow-up of thyroid nodule    History of Present Illness:   The patient's thyroid enlargement was first discovered in 06/2014 when she was having a CT scan of her neck.  She  had a thyroid ultrasound showing the following: Dominant left nodule is inferior measuring 3.4 cm x 3.4 cm x 4.8 cm.  Aspiration biopsy of the nodule in 6/16 showed atypia of undetermined significance/follicular lesion, BETHESDA category 3 but also had abundant colloid Although she was referred for surgery this appointment was delayed significantly and she was reluctant to have surgery also  She did however follow-up several months later and it was decided to recheck her ultrasound rather than repeat biopsy; this showed the following: Similar findings of multi nodular goiter. No new or enlarging thyroid nodules. Specifically, the dominant previously biopsied approximately 4.8 cm nodule / mass within the inferior aspect of the left lobe of the thyroid is unchanged since the 01/2015 examination.  Has had normal thyroid levels also  Lab Results  Component Value Date   TSH 2.21 01/21/2016   TSH 2.133 01/07/2013      DIABETES:   She has a history of mild Diabetes which has been well controlled and followed by PCP   Currently she is on  Metformin 500 twice a day Her last A1c was checked when she had diverticulitis and was admitted Is still pending appointment with her new PCP   Lab Results  Component Value Date   HGBA1C 5.8* 12/13/2015   HGBA1C 5.7 07/23/2015   HGBA1C 5.7* 10/09/2014   Lab Results  Component Value Date   MICROALBUR <0.7 07/23/2015   CREATININE 0.89 12/30/2015        Medication List       This list is accurate as of: 01/26/16  9:45 PM.  Always use your most recent med list.               albuterol (2.5 MG/3ML) 0.083% nebulizer solution  Commonly known  as:  PROVENTIL     albuterol 108 (90 Base) MCG/ACT inhaler  Commonly known as:  PROVENTIL HFA;VENTOLIN HFA  Inhale 1-2 puffs into the lungs every 4 (four) hours as needed. For shortness of breath.     ALPRAZolam 0.5 MG tablet  Commonly known as:  XANAX  Take 1 tablet (0.5 mg total) by mouth at bedtime as needed for anxiety.     benzonatate 100 MG capsule  Commonly known as:  TESSALON  Take 1 capsule (100 mg total) by mouth 3 (three) times daily as needed for cough.     ipratropium 0.02 % nebulizer solution  Commonly known as:  ATROVENT     ipratropium-albuterol 0.5-2.5 (3) MG/3ML Soln  Commonly known as:  DUONEB  Take 3 mLs by nebulization 2 (two) times daily.     losartan-hydrochlorothiazide 100-12.5 MG tablet  Commonly known as:  HYZAAR  Take 1 tablet by mouth daily. Please defer future refills to PCP     metFORMIN 500 MG tablet  Commonly known as:  GLUCOPHAGE  Take 1 tablet (500 mg total) by mouth 2 (two) times daily with a meal.     oxyCODONE 5 MG immediate release tablet  Commonly known as:  ROXICODONE  Take 1 tablet (5 mg total) by mouth every 6 (six) hours as needed for severe pain.     pantoprazole 40 MG tablet  Commonly known as:  PROTONIX  Take 1 tablet (40 mg total) by mouth daily.     polyethylene glycol packet  Commonly known as:  MIRALAX / GLYCOLAX  Take 17 g by mouth daily.     senna-docusate 8.6-50 MG tablet  Commonly known as:  Senokot-S  Take 1 tablet by mouth 2 (two) times daily.     tiotropium 18 MCG inhalation capsule  Commonly known as:  SPIRIVA  Place 1 capsule (18 mcg total) into inhaler and inhale daily.        Allergies:  Allergies  Allergen Reactions  . Aspartame And Phenylalanine Nausea And Vomiting    Patient says she is allergic to all artificial sweeteners  . Doxycycline Other (See Comments)    Nausea, vomiting, HA, double vision  . Tramadol Shortness Of Breath and Nausea Only  . Codeine Itching    Has not tried benadryl to  alleviate side effects  . Sulfa Antibiotics Other (See Comments)    Kidney problem   . Penicillins Rash    Has patient had a PCN reaction causing immediate rash, facial/tongue/throat swelling, SOB or lightheadedness with hypotension: No Has patient had a PCN reaction causing severe rash involving mucus membranes or skin necrosis: No Has patient had a PCN reaction that required hospitalization No Has patient had a PCN reaction occurring within the last 10 years: No If all of the above answers are "NO", then may proceed with Cephalosporin use.  . Prednisone Rash    Patient stated she received Prednisone while she was in the hospital and experienced a rash and "extreme pain.'    Past Medical History  Diagnosis Date  . Diabetes mellitus   . Hypertension   . COPD (chronic obstructive pulmonary disease) ([place])   . Chronic pain   . Anxiety   . Tracheobronchitis 01/01/2012  . High cholesterol   . OSA (obstructive sleep apnea)   . H/O: pneumonia   . GERD (gastroesophageal reflux disease)   . Colon polyps     adenomatous    Past Surgical History  Procedure Laterality Date  . Appendectomy    . Cholecystectomy    . Abdominal hysterectomy    . Rotator cuff repair    . Carpal tunnel release Left   . Fracture surgery    . Bilateral shoulder surgery    . Right knee surgery      Family History  Problem Relation Age of Onset  . Heart attack Mother   . Diabetes Maternal Grandfather   . Heart attack Maternal Grandfather   . Hypertension Maternal Grandfather   . Stroke Maternal Grandfather   . Thyroid disease Neg Hx   . Lung disease Neg Hx   . Heart attack Maternal Grandmother   . Hypertension Maternal Grandmother   . Alcohol abuse Father     Social History:  reports that she has been smoking Cigarettes.  She started smoking about 41 years ago. She has a 20.5 pack-year smoking history. She has never used smokeless tobacco. She reports that she does not drink alcohol or use illicit  drugs.    ROS            Examination:   BP 128/84 mmHg  Pulse 85  Temp(Src) 97.7 F (36.5 C)  Resp 14  Ht 5\' 3"  (1.6 m)  Wt 170 lb (77.111 kg)  BMI 30.12 kg/m2  SpO2 94%            The thyroid is not clearly enlarged, neither  side shows a palpable nodule even on swallowing No lymphadenopathy in the neck  Assessment/Plan:   Left-sided 3.4 cm nodule which showed atypia on biopsy in 6/16 Follow-up ultrasound showed no change in her nodule over about 7 months However her nodule is difficult to palpate clinically and currently not palpable again  Discussed that since she had no change in her nodule size it is very likely to be benign, and initially had been diagnosed in 2015 Will have her come back in 6 months with another ultrasound and if this is again in change will not need further follow-up unless she is symptomatic   [facility] 01/26/2016

## 2016-01-28 ENCOUNTER — Encounter: Payer: Self-pay | Admitting: Pulmonary Disease

## 2016-01-28 ENCOUNTER — Ambulatory Visit (INDEPENDENT_AMBULATORY_CARE_PROVIDER_SITE_OTHER): Payer: Medicare Other | Admitting: Pulmonary Disease

## 2016-01-28 VITALS — BP 134/76 | HR 84 | Ht 63.0 in | Wt 165.0 lb

## 2016-01-28 DIAGNOSIS — J449 Chronic obstructive pulmonary disease, unspecified: Secondary | ICD-10-CM | POA: Diagnosis not present

## 2016-01-28 DIAGNOSIS — J441 Chronic obstructive pulmonary disease with (acute) exacerbation: Secondary | ICD-10-CM

## 2016-01-28 DIAGNOSIS — K219 Gastro-esophageal reflux disease without esophagitis: Secondary | ICD-10-CM | POA: Diagnosis not present

## 2016-01-28 DIAGNOSIS — G4733 Obstructive sleep apnea (adult) (pediatric): Secondary | ICD-10-CM

## 2016-01-28 DIAGNOSIS — F17200 Nicotine dependence, unspecified, uncomplicated: Secondary | ICD-10-CM

## 2016-01-28 LAB — PULMONARY FUNCTION TEST
FEF 25-75 Post: 0.43 L/sec
FEF 25-75 Pre: 0.47 L/sec
FEF2575-%CHANGE-POST: -7 %
FEF2575-%PRED-POST: 22 %
FEF2575-%Pred-Pre: 24 %
FEV1-%CHANGE-POST: -4 %
FEV1-%Pred-Post: 49 %
FEV1-%Pred-Pre: 51 %
FEV1-Post: 1.12 L
FEV1-Pre: 1.16 L
FEV1FVC-%CHANGE-POST: 0 %
FEV1FVC-%Pred-Pre: 66 %
FEV6-%Change-Post: -2 %
FEV6-%Pred-Post: 74 %
FEV6-%Pred-Pre: 77 %
FEV6-PRE: 2.2 L
FEV6-Post: 2.13 L
FEV6FVC-%Change-Post: 0 %
FEV6FVC-%PRED-PRE: 98 %
FEV6FVC-%Pred-Post: 99 %
FVC-%CHANGE-POST: -3 %
FVC-%PRED-POST: 75 %
FVC-%PRED-PRE: 77 %
FVC-PRE: 2.31 L
FVC-Post: 2.23 L
POST FEV1/FVC RATIO: 50 %
PRE FEV6/FVC RATIO: 95 %
Post FEV6/FVC ratio: 96 %
Pre FEV1/FVC ratio: 50 %

## 2016-01-28 NOTE — Progress Notes (Signed)
Subjective:    Patient ID: Victoria Bird, female    DOB: Nov 26, 1945, 70 y.o.   MRN: [id number]  C.C.:  Follow-up with Severe COPD, Chronic Hypoxic Respiratory Failure, OSA, GERD, & Ongoing Tobacco Use.  HPI Severe COPD: Alpha-1 antitrypsin phenotype ordered at last appointment but still not completed. Started on antihistamine at last appointment for allergies in February. Denies any coughing or wheezing except rarely. She does report dyspnea when she eats too much. Reports she is compliant with Spiriva. She uses a rescue inhaler after she eats and it seems to help with her breathing.   OSA: Last polysomnogram was 2010.  Previously had trouble falling asleep. Patient does have significant claustrophobia.  GERD:  Previously prescribed Zantac. Now switched to Protonix. She reports intermittent diarrhea and constipation. Denies any recent reflux. She does have follow-up with GI in May.  Ongoing Tobacco Use: Previously patient reports she has been unable to quit due to psychosocial stressors. She didn't smoke her entire hospitalization. Currently smokes intermittently.   Review of Systems She denies any frank chest pain or pressure but is confused on whether it is epigastric discomfort from her stomach. She reports intermittent, low-grade fever. No chills. Does have sweats at times. She does report increasing pain in her knee and hip. She has also had falls with a walker prescribed for her after her recent hospitalization.   Allergies  Allergen Reactions  . Aspartame And Phenylalanine Nausea And Vomiting    Patient says she is allergic to all artificial sweeteners  . Doxycycline Other (See Comments)    Nausea, vomiting, HA, double vision  . Tramadol Shortness Of Breath and Nausea Only  . Codeine Itching    Has not tried benadryl to alleviate side effects  . Sulfa Antibiotics Other (See Comments)    Kidney problem   . Penicillins Rash    Has patient had a PCN reaction causing immediate  rash, facial/tongue/throat swelling, SOB or lightheadedness with hypotension: No Has patient had a PCN reaction causing severe rash involving mucus membranes or skin necrosis: No Has patient had a PCN reaction that required hospitalization No Has patient had a PCN reaction occurring within the last 10 years: No If all of the above answers are "NO", then may proceed with Cephalosporin use.  . Prednisone Rash    Patient stated she received Prednisone while she was in the hospital and experienced a rash and "extreme pain.'   Current Outpatient Prescriptions on File Prior to Visit  Medication Sig Dispense Refill  . albuterol (PROVENTIL HFA;VENTOLIN HFA) 108 (90 Base) MCG/ACT inhaler Inhale 1-2 puffs into the lungs every 4 (four) hours as needed. For shortness of breath. 1 Inhaler 3  . albuterol (PROVENTIL) (2.5 MG/3ML) 0.083% nebulizer solution   11  . ALPRAZolam (XANAX) 0.5 MG tablet Take 1 tablet (0.5 mg total) by mouth at bedtime as needed for anxiety. 30 tablet 0  . benzonatate (TESSALON) 100 MG capsule Take 1 capsule (100 mg total) by mouth 3 (three) times daily as needed for cough. 30 capsule 0  . ipratropium (ATROVENT) 0.02 % nebulizer solution   11  . ipratropium-albuterol (DUONEB) 0.5-2.5 (3) MG/3ML SOLN Take 3 mLs by nebulization 2 (two) times daily. 360 mL 1  . losartan-hydrochlorothiazide (HYZAAR) 100-12.5 MG tablet Take 1 tablet by mouth daily. Please defer future refills to PCP 30 tablet 0  . metFORMIN (GLUCOPHAGE) 500 MG tablet Take 1 tablet (500 mg total) by mouth 2 (two) times daily with a meal. 60 tablet  2  . pantoprazole (PROTONIX) 40 MG tablet Take 1 tablet (40 mg total) by mouth daily. 30 tablet 0  . polyethylene glycol (MIRALAX / GLYCOLAX) packet Take 17 g by mouth daily. 14 each 0  . senna-docusate (SENOKOT-S) 8.6-50 MG tablet Take 1 tablet by mouth 2 (two) times daily. 30 tablet 0  . tiotropium (SPIRIVA) 18 MCG inhalation capsule Place 1 capsule (18 mcg total) into inhaler  and inhale daily. 30 capsule 0   No current facility-administered medications on file prior to visit.   Past Medical History  Diagnosis Date  . Diabetes mellitus   . Hypertension   . COPD (chronic obstructive pulmonary disease) ([place])   . Chronic pain   . Anxiety   . Tracheobronchitis 01/01/2012  . High cholesterol   . OSA (obstructive sleep apnea)   . H/O: pneumonia   . GERD (gastroesophageal reflux disease)   . Colon polyps     adenomatous   Past Surgical History  Procedure Laterality Date  . Appendectomy    . Cholecystectomy    . Abdominal hysterectomy    . Rotator cuff repair    . Carpal tunnel release Left   . Fracture surgery    . Bilateral shoulder surgery    . Right knee surgery     Family History  Problem Relation Age of Onset  . Heart attack Mother   . Diabetes Maternal Grandfather   . Heart attack Maternal Grandfather   . Hypertension Maternal Grandfather   . Stroke Maternal Grandfather   . Thyroid disease Neg Hx   . Lung disease Neg Hx   . Heart attack Maternal Grandmother   . Hypertension Maternal Grandmother   . Alcohol abuse Father    Social History   Social History  . Marital Status: Divorced    Spouse Name: N/A  . Number of Children: 2  . Years of Education: GED   Occupational History  . Disability    Social History Main Topics  . Smoking status: Current Some Day Smoker -- 0.50 packs/day for 41 years    Types: Cigarettes    Start date: 03/19/1974  . Smokeless tobacco: Never Used     Comment: Peak rate of 1.5ppd  . Alcohol Use: No  . Drug Use: No  . Sexual Activity: No   Other Topics Concern  . None   Social History Narrative   Daughter Edmonia Lynch) [phone]   Originally from [place]. Previously lived in [place]. She has traveled from [facility] to [place] & has also traveled across country to [place]. Multiple visits to [place], [place], [place], [place], [place]. She has a dog at home. No bird, mold, or hot tub exposure. She reports some years ago  she did live in a home that had black mold in her closet. 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       Pt lives with her youngest daughter, lives in a trailer, does have stairs into home. Does have some troubled with that. Highest level of education is GED.       Objective:   Physical Exam BP 134/76 mmHg  Pulse 84  Ht 5\' 3"  (1.6 m)  Wt 165 lb (74.844 kg)  BMI 29.24 kg/m2  SpO2 90% General:  Awake. Appears comfortable. No acute distress. Integument:  Warm & dry. No rash on exposed skin. HEENT:  Moist mucus membranes. No  scleral icterus. Cardiovascular:  Regular rate & rhythm. No edema. Normal S1 & S2. Pulmonary:  Good auscultation. Good aeration bilaterally. Normal breathing on room air. Abdomen: Soft. Normal bowel sounds. Mildly protuberant.  Musculoskeletal:  Normal bulk and tone. No joint deformity or effusion appreciated.  PFT 01/28/16: FVC 2.31 L (77%) FEV1 1.16 L (81%) FEV1/FVC 0.50 FEF 25-75 0.47 L (24%) no bronchodilator response 06/09/15: FVC 2.21 L (73%) FEV1 1.08 L (47%) FEV1/FVC 0.49 FEF 25-75 0.43 L (22%) no bronchodilator response TLC 5.26 L (105%) RV 135% ERV 87% DLCO corrected 36% (Hgb 16.2)  6MWT 10/20/15:  Walked 240 meters / Baseline Sat 94% on RA / Nadir Sat 90% on RA 06/22/15:  3 laps around office. Baseline 93% on RA. Nadir Sat 89% on RA. 06/18/15:  Walked in hospital by PT / Baseline Sat 90% on RA / Nadir Sat 86% on RA (required 3 L/m on exertion to maintain)  IMAGING CXR PA/LAT 10/08/14 (previously reviewed by me): Mild persistent linear opacity consistent with scar at the right lung base. Kyphosis noted. Hyperinflation with some flattening of the diaphragm. No pleural effusion or thickening appreciated. No parenchymal opacity or nodule appreciated. Heart normal in size. Mediastinum: Contour.  CT CHEST W/ 04/16/02 (per radiologist): No  evidence for pulmonary emboli. No pathologically enlarged mediastinal or hilar lymph nodes. Enlarged lower pole of left thyroid lobe with a nodular configuration. Mild water density alveolitis with an anterior portion of left upper lobe & medial anterior portion of right upper lobe. No parenchymal nodules.   CARDIAC TTE (01/08/13): LV normal in size. EF 60-65%. Normal wall motion without regional abnormality. LA & RA normal in size. RV normal in size and function. Pulmonary artery systolic pressure 43 mmHg. Trivial aortic regurgitation. No mitral stenosis or regurgitation. No pulmonic or tricuspid regurgitation. No pericardial effusion.  LABS 05/27/15 Alpha-1 antitrypsin: 164  04/29/15 BMP: 140/4.2/102/24/16/1.01/93/9 LFT: 4/6.7/0.7/67/14/13  10/09/14 VBG (on room air): 7.39/37/59  03/24/13 ANA: Negative  RF:  <7.0    Assessment & Plan:  70 year old female with underlying severe COPD. Patient seems to be relatively stable with regards to her COPD. Spirometry has remained stable as well without evidence of significant bronchodilator response today. Reflux seems to be better controlled on Protonix. I did speak with the patient for over 3 minutes on need for complete tobacco cessation to prevent worsening of her lung function. I instructed the patient contact our office if she had any new breathing problems before next appointment.  1. Severe COPD: Discontinuing Atrovent & ipratropium bromide in nebulizer. Continuing Spiriva. 2. GERD: Controlled with Zantac. No changes. 3. OSA: Continues to decline CPAP therapy. Unsure if oral appliance would be of great benefit. 4. Ongoing tobacco use: Spent over 3 minutes counseling the patient on the need for complete tobacco cessation. 5. Health Maintenance:  Previously had Influenza Vaccine October 2016, Pneumovax April 2017 & Tdap April 2017. 6. Follow-up: Return to clinic in 6 months or sooner if needed.  Sonia Baller Ashok Cordia, M.D. [facility]monary [facility]  Critical [facility] Pager:  [phone] After 3pm or if no response, call [phone] 4:13 PM 01/28/2016

## 2016-01-28 NOTE — Addendum Note (Signed)
Addended by: Maryanna Shape A on: 01/28/2016 04:17 PM   Modules accepted: Orders, Medications

## 2016-01-28 NOTE — Progress Notes (Signed)
Spirometry pre and post done today. 

## 2016-01-28 NOTE — Patient Instructions (Addendum)
   Continue using your inhalers and medications as prescribed.    You should be using Albuterol in your nebulizer as needed.  You should not be using Atrovent/Ipratroprium Bromide in your nebulizer.   Please call me if you have any new breathing problems before your next appointment.  I will see you back in 6 months or sooner if needed.

## 2016-02-25 ENCOUNTER — Encounter: Payer: Self-pay | Admitting: Internal Medicine

## 2016-02-25 ENCOUNTER — Ambulatory Visit (INDEPENDENT_AMBULATORY_CARE_PROVIDER_SITE_OTHER): Payer: Medicare Other | Admitting: Internal Medicine

## 2016-02-25 VITALS — BP 130/82 | HR 93 | Temp 98.4°F | Resp 18 | Ht 63.0 in | Wt 168.0 lb

## 2016-02-25 DIAGNOSIS — G894 Chronic pain syndrome: Secondary | ICD-10-CM | POA: Diagnosis not present

## 2016-02-25 DIAGNOSIS — R1030 Lower abdominal pain, unspecified: Secondary | ICD-10-CM

## 2016-02-25 DIAGNOSIS — J449 Chronic obstructive pulmonary disease, unspecified: Secondary | ICD-10-CM | POA: Diagnosis not present

## 2016-02-25 DIAGNOSIS — E118 Type 2 diabetes mellitus with unspecified complications: Secondary | ICD-10-CM | POA: Diagnosis not present

## 2016-02-25 DIAGNOSIS — R079 Chest pain, unspecified: Secondary | ICD-10-CM

## 2016-02-25 DIAGNOSIS — R109 Unspecified abdominal pain: Secondary | ICD-10-CM | POA: Insufficient documentation

## 2016-02-25 DIAGNOSIS — F4323 Adjustment disorder with mixed anxiety and depressed mood: Secondary | ICD-10-CM

## 2016-02-25 DIAGNOSIS — F17200 Nicotine dependence, unspecified, uncomplicated: Secondary | ICD-10-CM

## 2016-02-25 DIAGNOSIS — J410 Simple chronic bronchitis: Secondary | ICD-10-CM

## 2016-02-25 MED ORDER — LOSARTAN POTASSIUM-HCTZ 100-12.5 MG PO TABS: 1.0000 | ORAL_TABLET | Freq: Every day | ORAL | Status: DC

## 2016-02-25 MED ORDER — ALPRAZOLAM 0.5 MG PO TABS: 0.5000 mg | ORAL_TABLET | Freq: Every evening | ORAL | Status: DC | PRN

## 2016-02-25 MED ORDER — GABAPENTIN 300 MG PO CAPS: 300.0000 mg | ORAL_CAPSULE | Freq: Three times a day (TID) | ORAL | Status: DC

## 2016-02-25 MED ORDER — PANTOPRAZOLE SODIUM 40 MG PO TBEC: 40.0000 mg | DELAYED_RELEASE_TABLET | Freq: Every day | ORAL | Status: DC

## 2016-02-25 NOTE — Assessment & Plan Note (Signed)
She does not want to labeled as depression and does not want medication. She does not have financial resources for therapy and does not want to go. Given strategies for stress management. Hopefully some pain management will help with her mood. Suspect that this is significantly limiting her QOL at this time.

## 2016-02-25 NOTE — Patient Instructions (Signed)
We have sent in the xanax to use at night time if needed for sleep.   We have also sent in a medicine for the nerve pain called gabapentin. To start with you can take 1 pill at night time to help with pain before sleeping. After 3 days it is okay to increase to twice a day. Then after 3 days of twice a day you can increase to 3 times per day if it helps.   We would like to see you back in about 1-2 months to check on how you are doing.   If you start having more problems with the depression then call the office as there are several medicines that can help as well as therapy can be helpful if you want.   If you can we would like you to try to start doing some light activity around the house to help with your endurance.   Stress and Stress Management Stress is a normal reaction to life events. It is what you feel when life demands more than you are used to or more than you can handle. Some stress can be useful. For example, the stress reaction can help you catch the last bus of the day, study for a test, or meet a deadline at work. But stress that occurs too often or for too long can cause problems. It can affect your emotional health and interfere with relationships and normal daily activities. Too much stress can weaken your immune system and increase your risk for physical illness. If you already have a medical problem, stress can make it worse. CAUSES  All sorts of life events may cause stress. An event that causes stress for one person may not be stressful for another person. Major life events commonly cause stress. These may be positive or negative. Examples include losing your job, moving into a new home, getting married, having a baby, or losing a loved one. Less obvious life events may also cause stress, especially if they occur day after day or in combination. Examples include working long hours, driving in traffic, caring for children, being in debt, or being in a difficult relationship. SIGNS  AND SYMPTOMS Stress may cause emotional symptoms including, the following:  Anxiety. This is feeling worried, afraid, on edge, overwhelmed, or out of control.  Anger. This is feeling irritated or impatient.  Depression. This is feeling sad, down, helpless, or guilty.  Difficulty focusing, remembering, or making decisions. Stress may cause physical symptoms, including the following:   Aches and pains. These may affect your head, neck, back, stomach, or other areas of your body.  Tight muscles or clenched jaw.  Low energy or trouble sleeping. Stress may cause unhealthy behaviors, including the following:   Eating to feel better (overeating) or skipping meals.  Sleeping too little, too much, or both.  Working too much or putting off tasks (procrastination).  Smoking, drinking alcohol, or using drugs to feel better. DIAGNOSIS  Stress is diagnosed through an assessment by your health care provider. Your health care provider will ask questions about your symptoms and any stressful life events.Your health care provider will also ask about your medical history and may order blood tests or other tests. Certain medical conditions and medicine can cause physical symptoms similar to stress. Mental illness can cause emotional symptoms and unhealthy behaviors similar to stress. Your health care provider may refer you to a mental health professional for further evaluation.  TREATMENT  Stress management is the recommended treatment for stress.The  goals of stress management are reducing stressful life events and coping with stress in healthy ways.  Techniques for reducing stressful life events include the following:  Stress identification. Self-monitor for stress and identify what causes stress for you. These skills may help you to avoid some stressful events.  Time management. Set your priorities, keep a calendar of events, and learn to say "no." These tools can help you avoid making too many  commitments. Techniques for coping with stress include the following:  Rethinking the problem. Try to think realistically about stressful events rather than ignoring them or overreacting. Try to find the positives in a stressful situation rather than focusing on the negatives.  Exercise. Physical exercise can release both physical and emotional tension. The key is to find a form of exercise you enjoy and do it regularly.  Relaxation techniques. These relax the body and mind. Examples include yoga, meditation, tai chi, biofeedback, deep breathing, progressive muscle relaxation, listening to music, being out in nature, journaling, and other hobbies. Again, the key is to find one or more that you enjoy and can do regularly.  Healthy lifestyle. Eat a balanced diet, get plenty of sleep, and do not smoke. Avoid using alcohol or drugs to relax.  Strong support network. Spend time with family, friends, or other people you enjoy being around.Express your feelings and talk things over with someone you trust. Counseling or talktherapy with a mental health professional may be helpful if you are having difficulty managing stress on your own. Medicine is typically not recommended for the treatment of stress.Talk to your health care provider if you think you need medicine for symptoms of stress. HOME CARE INSTRUCTIONS  Keep all follow-up visits as directed by your health care provider.  Take all medicines as directed by your health care provider. SEEK MEDICAL CARE IF:  Your symptoms get worse or you start having new symptoms.  You feel overwhelmed by your problems and can no longer manage them on your own. SEEK IMMEDIATE MEDICAL CARE IF:  You feel like hurting yourself or someone else.   This information is not intended to replace advice given to you by your health care provider. Make sure you discuss any questions you have with your health care provider.   Document Released: 03/14/2001 Document  Revised: 10/09/2014 Document Reviewed: 05/13/2013 Elsevier Interactive Patient Education [employer].

## 2016-02-25 NOTE — Assessment & Plan Note (Signed)
Rx for gabapentin to be titrated to 300 mg TID for pain. I suspect that some of her chronic pain is neuropathic from back and prior trauma. This is significantly limiting her life.

## 2016-02-25 NOTE — Assessment & Plan Note (Signed)
She is still smoking and has no desire to quit at this due to overall poor QOL and this is one pleasure she has. Did talk to her about financial incentive to quit and she will think about it.
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
## 2016-02-25 NOTE — Assessment & Plan Note (Signed)
Does not have tenderness today on exam, prior CT without diverticulitis from March and prior lipase negative. Will not check labs today. She is seeing GI in 4 days and hopefully they will get her set up for colonoscopy (per her reports has had in the past but overdue for repeat).

## 2016-02-25 NOTE — Progress Notes (Signed)
Pre visit review using our clinic review tool, if applicable. No additional management support is needed unless otherwise documented below in the visit note. 

## 2016-02-25 NOTE — Assessment & Plan Note (Addendum)
Have advised her to decrease metformin to daily only since her last Hga1c is <6. She is likely overtreated at this time. No complications known but she is not up to date on eye exam. Foot exam done today. Is on ARB. Not on statin. Refer to [facility] as she is having difficulty affording her various medications and social situation is poor.

## 2016-02-25 NOTE — Assessment & Plan Note (Signed)
Multiple workup in the past and recent stress test with preserved EF and no signs of insufficiency. Still having intermittently.

## 2016-02-25 NOTE — Assessment & Plan Note (Signed)
She will continue to see pulmonary and it is likely she will not be able to afford any inhalers given her poor financial and social situation. Referral to [facility] for possible assistance and resources.

## 2016-02-29 ENCOUNTER — Encounter: Payer: Self-pay | Admitting: Internal Medicine

## 2016-02-29 ENCOUNTER — Ambulatory Visit (INDEPENDENT_AMBULATORY_CARE_PROVIDER_SITE_OTHER): Payer: Medicare Other | Admitting: Internal Medicine

## 2016-02-29 VITALS — BP 130/70 | HR 82 | Temp 98.6°F | Ht 63.5 in | Wt 167.0 lb

## 2016-02-29 DIAGNOSIS — Z86010 Personal history of colonic polyps: Secondary | ICD-10-CM

## 2016-02-29 MED ORDER — NA SULFATE-K SULFATE-MG SULF 17.5-3.13-1.6 GM/177ML PO SOLN: 1.0000 | Freq: Once | ORAL | Status: DC

## 2016-02-29 NOTE — Patient Instructions (Signed)

## 2016-02-29 NOTE — Progress Notes (Signed)
HISTORY OF PRESENT ILLNESS:  Victoria Bird is a 70 y.o. female with type 2 diabetes mellitus, COPD, anxiety, chronic pain syndrome, GERD, and colon polyps. She presents today regarding multiple GI complaints and the need for surveillance colonoscopy. We were able to obtain outside records for review. Patient has had prior colonoscopies in 2008 and most recent December 2013. Most recent examination revealed severe diverticulosis and multiple polyps which were sessile serrated polyps, adenomatous, and hyperplastic. She received a recall letter from that practice. She's also had multiple upper endoscopies without significant abnormalities found. She does stay on chronic PPI for GERD. No active symptoms. She does have chronic lower abdominal discomfort associated with irregular bowel habits. Diarrhea seems to be more problematic constipation. She will take a laxative occasionally for constipation but no antidiarrheal agents. No bleeding. She was hospitalized in March with sigmoid diverticulitis. Uncomplicated. Has improved. Has some abdominal discomfort and bloating after meals. Weight is been stable. She continues to smoke. In addition to imaging in prior endoscopy reports, outside laboratories reviewed  REVIEW OF SYSTEMS:  All non-GI ROS negative except for anxiety, chronic pain, cough, weakness  Past Medical History  Diagnosis Date  . Diabetes mellitus   . Hypertension   . COPD (chronic obstructive pulmonary disease) ([place])   . Chronic pain   . Anxiety   . Tracheobronchitis 01/01/2012  . High cholesterol   . OSA (obstructive sleep apnea)   . H/O: pneumonia   . GERD (gastroesophageal reflux disease)   . Colon polyps     adenomatous  . Diverticulosis   . Gall stones   . Diverticulitis   . Emphysema lung [facility])     Past Surgical History  Procedure Laterality Date  . Appendectomy    . Cholecystectomy    . Abdominal hysterectomy    . Rotator cuff repair    . Carpal tunnel release Left    . Fracture surgery    . Bilateral shoulder surgery    . Right knee surgery    . Colonoscopy    . Esophagogastroduodenoscopy      Social History Victoria Bird  reports that she has been smoking Cigarettes.  She started smoking about 41 years ago. She has a 20.5 pack-year smoking history. She has never used smokeless tobacco. She reports that she does not drink alcohol or use illicit drugs.  family history includes Alcohol abuse in her father; Diabetes in her maternal grandfather; Heart attack in her maternal grandfather, maternal grandmother, and mother; Hypertension in her maternal grandfather and maternal grandmother; Stroke in her maternal grandfather. There is no history of Thyroid disease, Lung disease, or Colon cancer.  Allergies  Allergen Reactions  . Aspartame And Phenylalanine Nausea And Vomiting    Patient says she is allergic to all artificial sweeteners  . Doxycycline Other (See Comments)    Nausea, vomiting, HA, double vision  . Tramadol Shortness Of Breath and Nausea Only  . Codeine Itching    Has not tried benadryl to alleviate side effects  . Neurontin [Gabapentin] Other (See Comments)    Dizzy, "drugged" feeling, sleepy  . Sulfa Antibiotics Other (See Comments)    Kidney problem   . Penicillins Rash    Has patient had a PCN reaction causing immediate rash, facial/tongue/throat swelling, SOB or lightheadedness with hypotension: No Has patient had a PCN reaction causing severe rash involving mucus membranes or skin necrosis: No Has patient had a PCN reaction that required hospitalization No Has patient had a PCN reaction occurring  within the last 10 years: No If all of the above answers are "NO", then may proceed with Cephalosporin use.  . Prednisone Rash    Patient stated she received Prednisone while she was in the hospital and experienced a rash and "extreme pain.'       PHYSICAL EXAMINATION: Vital signs: BP 130/70 mmHg  Pulse 82  Temp(Src) 98.6 F (37  C)  Ht 5' 3.5" (1.613 m)  Wt 167 lb (75.751 kg)  BMI 29.12 kg/m2  Constitutional: generally well-appearing, no acute distress Psychiatric: alert and oriented x3, cooperative Eyes: extraocular movements intact, anicteric, conjunctiva pink Mouth: oral pharynx moist, no lesions. Poor dentition Neck: supple no lymphadenopathy Cardiovascular: heart regular rate and rhythm, no murmur Lungs: clear to auscultation bilaterally Abdomen: soft, nontender, nondistended, no obvious ascites, no peritoneal signs, normal bowel sounds, no organomegaly Rectal:Deferred until colonoscopy Extremities: no clubbing cyanosis or lower extremity edema bilaterally Skin: no lesions on visible extremities. Thin hair Neuro: No focal deficits. Cranial nerves intact. No asterixis.    ASSESSMENT:  #1. History of multiple precancerous polyps. Last examination 2 summer 2013. Due for follow-up #2. Diverticulosis with a history of diverticulitis March 2013. No pain on exam today #3. Chronic alternating bowel habits and postprandial abdominal discomfort and bloating. Most consistent with irritable bowel #4. GERD. Symptoms controlled with PPI #5. Multiple medical problems including COPD and type 2 diabetes   PLAN:  #1. Daily fiber supplementation for alternating bowel habits #2. Schedule surveillance colonoscopy. Patient is high-risk given her lung disease.The nature of the procedure, as well as the risks, benefits, and alternatives were carefully and thoroughly reviewed with the patient. Ample time for discussion and questions allowed. The patient understood, was satisfied, and agreed to proceed. #3. Hold metformin the day of the procedure. Advised #4. Stop smoking #5. Reflux precautions #6. Continue PPI to control GERD symptoms. Lowest effective dose recommended  A copy of this consultation note has been sent to PCP

## 2016-03-02 ENCOUNTER — Telehealth: Payer: Self-pay

## 2016-03-02 ENCOUNTER — Other Ambulatory Visit: Payer: Self-pay

## 2016-03-02 DIAGNOSIS — Z86010 Personal history of colonic polyps: Secondary | ICD-10-CM

## 2016-03-02 NOTE — Telephone Encounter (Signed)
Pt scheduled for colon at [name] 04/06/16@10 :30am. Pt to arrive there at 9am, scheduled with Janie. Updated prep instructions mailed to pt. Left message for pt to call back.

## 2016-03-02 NOTE — Telephone Encounter (Signed)
-----   Message from Irene Shipper, MD sent at 03/01/2016  3:30 PM EDT ----- Regarding: FW: ASA IV pt, severe COPD Please let this lady know that anesthesia will not permit her procedure at this outpatient facility due to her lung disease. Thus, she needs to be done at the hospital. Please schedule her with propofol during my hospital week of July 3. Can be either hospital as long as propofol/MAC available. Also, cancel her [facility] slot. Thanks ----- Message -----    From: Osvaldo Angst, CRNA    Sent: 02/29/2016   8:10 PM      To: Irene Shipper, MD Subject: ASA IV pt, severe COPD                         Dr. Henrene Pastor,  This pt has severe COPD, dx'd in 2016, and, except for lack of funds, would be on home O2.  This situation is documented in her problem list under chronic respiratory failure. Consequently, she does not qualify for anesthetic care at [facility].  Thanks for your consideration in this matter.  Kind regards,  Osvaldo Angst

## 2016-03-02 NOTE — Telephone Encounter (Signed)
Patient returned phone call and I gave her all of this information.

## 2016-03-06 ENCOUNTER — Encounter: Payer: Medicare Other | Admitting: Internal Medicine

## 2016-03-10 ENCOUNTER — Inpatient Hospital Stay (HOSPITAL_COMMUNITY)
Admission: EM | Admit: 2016-03-10 | Discharge: 2016-03-20 | DRG: 190 | Disposition: A | Discharge status: Home / Self Care | Payer: Medicare Other | Attending: Family Medicine | Admitting: Family Medicine

## 2016-03-10 ENCOUNTER — Emergency Department (HOSPITAL_COMMUNITY): Payer: Medicare Other

## 2016-03-10 ENCOUNTER — Encounter (HOSPITAL_COMMUNITY): Payer: Self-pay | Admitting: Emergency Medicine

## 2016-03-10 DIAGNOSIS — K219 Gastro-esophageal reflux disease without esophagitis: Secondary | ICD-10-CM | POA: Diagnosis present

## 2016-03-10 DIAGNOSIS — Z811 Family history of alcohol abuse and dependence: Secondary | ICD-10-CM

## 2016-03-10 DIAGNOSIS — J9621 Acute and chronic respiratory failure with hypoxia: Secondary | ICD-10-CM | POA: Diagnosis present

## 2016-03-10 DIAGNOSIS — F17200 Nicotine dependence, unspecified, uncomplicated: Secondary | ICD-10-CM | POA: Diagnosis present

## 2016-03-10 DIAGNOSIS — R1310 Dysphagia, unspecified: Secondary | ICD-10-CM | POA: Diagnosis present

## 2016-03-10 DIAGNOSIS — R0902 Hypoxemia: Secondary | ICD-10-CM

## 2016-03-10 DIAGNOSIS — Z888 Allergy status to other drugs, medicaments and biological substances status: Secondary | ICD-10-CM

## 2016-03-10 DIAGNOSIS — Z8249 Family history of ischemic heart disease and other diseases of the circulatory system: Secondary | ICD-10-CM

## 2016-03-10 DIAGNOSIS — G4733 Obstructive sleep apnea (adult) (pediatric): Secondary | ICD-10-CM | POA: Diagnosis present

## 2016-03-10 DIAGNOSIS — Z823 Family history of stroke: Secondary | ICD-10-CM

## 2016-03-10 DIAGNOSIS — G894 Chronic pain syndrome: Secondary | ICD-10-CM | POA: Diagnosis present

## 2016-03-10 DIAGNOSIS — J9622 Acute and chronic respiratory failure with hypercapnia: Secondary | ICD-10-CM | POA: Diagnosis not present

## 2016-03-10 DIAGNOSIS — F17210 Nicotine dependence, cigarettes, uncomplicated: Secondary | ICD-10-CM | POA: Diagnosis present

## 2016-03-10 DIAGNOSIS — Z7951 Long term (current) use of inhaled steroids: Secondary | ICD-10-CM | POA: Diagnosis not present

## 2016-03-10 DIAGNOSIS — J410 Simple chronic bronchitis: Secondary | ICD-10-CM | POA: Diagnosis not present

## 2016-03-10 DIAGNOSIS — M545 Low back pain: Secondary | ICD-10-CM | POA: Diagnosis present

## 2016-03-10 DIAGNOSIS — E119 Type 2 diabetes mellitus without complications: Secondary | ICD-10-CM | POA: Diagnosis present

## 2016-03-10 DIAGNOSIS — J209 Acute bronchitis, unspecified: Secondary | ICD-10-CM | POA: Diagnosis not present

## 2016-03-10 DIAGNOSIS — J441 Chronic obstructive pulmonary disease with (acute) exacerbation: Secondary | ICD-10-CM | POA: Diagnosis not present

## 2016-03-10 DIAGNOSIS — Z8701 Personal history of pneumonia (recurrent): Secondary | ICD-10-CM

## 2016-03-10 DIAGNOSIS — I10 Essential (primary) hypertension: Secondary | ICD-10-CM | POA: Diagnosis not present

## 2016-03-10 DIAGNOSIS — Z7984 Long term (current) use of oral hypoglycemic drugs: Secondary | ICD-10-CM | POA: Diagnosis not present

## 2016-03-10 DIAGNOSIS — Z86010 Personal history of colonic polyps: Secondary | ICD-10-CM

## 2016-03-10 DIAGNOSIS — J449 Chronic obstructive pulmonary disease, unspecified: Secondary | ICD-10-CM | POA: Diagnosis present

## 2016-03-10 DIAGNOSIS — Z833 Family history of diabetes mellitus: Secondary | ICD-10-CM | POA: Diagnosis not present

## 2016-03-10 DIAGNOSIS — R0600 Dyspnea, unspecified: Secondary | ICD-10-CM | POA: Diagnosis not present

## 2016-03-10 DIAGNOSIS — E7800 Pure hypercholesterolemia, unspecified: Secondary | ICD-10-CM | POA: Diagnosis present

## 2016-03-10 DIAGNOSIS — E876 Hypokalemia: Secondary | ICD-10-CM | POA: Diagnosis present

## 2016-03-10 DIAGNOSIS — Z720 Tobacco use: Secondary | ICD-10-CM | POA: Diagnosis not present

## 2016-03-10 DIAGNOSIS — R05 Cough: Secondary | ICD-10-CM | POA: Diagnosis not present

## 2016-03-10 DIAGNOSIS — R0602 Shortness of breath: Secondary | ICD-10-CM | POA: Diagnosis not present

## 2016-03-10 LAB — I-STAT CHEM 8, ED
BUN: 25 mg/dL — ABNORMAL HIGH (ref 6–20)
Calcium, Ion: 1.09 mmol/L — ABNORMAL LOW (ref 1.13–1.30)
Chloride: 99 mmol/L — ABNORMAL LOW (ref 101–111)
Creatinine, Ser: 1.2 mg/dL — ABNORMAL HIGH (ref 0.44–1.00)
Glucose, Bld: 128 mg/dL — ABNORMAL HIGH (ref 65–99)
HEMATOCRIT: 41 % (ref 36.0–46.0)
HEMOGLOBIN: 13.9 g/dL (ref 12.0–15.0)
POTASSIUM: 5.7 mmol/L — AB (ref 3.5–5.1)
SODIUM: 137 mmol/L (ref 135–145)
TCO2: 33 mmol/L (ref 0–100)

## 2016-03-10 LAB — URINALYSIS, ROUTINE W REFLEX MICROSCOPIC
BILIRUBIN URINE: NEGATIVE
Glucose, UA: >1000 mg/dL — AB
Hgb urine dipstick: NEGATIVE
KETONES UR: NEGATIVE mg/dL
LEUKOCYTES UA: NEGATIVE
NITRITE: NEGATIVE
PH: 5.5 (ref 5.0–8.0)
PROTEIN: NEGATIVE mg/dL
Specific Gravity, Urine: 1.015 (ref 1.005–1.030)

## 2016-03-10 LAB — CBC WITH DIFFERENTIAL/PLATELET
BASOS ABS: 0 10*3/uL (ref 0.0–0.1)
BASOS PCT: 0 %
EOS ABS: 0.3 10*3/uL (ref 0.0–0.7)
Eosinophils Relative: 3 %
HEMATOCRIT: 40.7 % (ref 36.0–46.0)
HEMOGLOBIN: 14.1 g/dL (ref 12.0–15.0)
Lymphocytes Relative: 19 %
Lymphs Abs: 2.1 10*3/uL (ref 0.7–4.0)
MCH: 29.3 pg (ref 26.0–34.0)
MCHC: 34.6 g/dL (ref 30.0–36.0)
MCV: 84.6 fL (ref 78.0–100.0)
MONO ABS: 1 10*3/uL (ref 0.1–1.0)
MONOS PCT: 9 %
NEUTROS ABS: 7.5 10*3/uL (ref 1.7–7.7)
NEUTROS PCT: 69 %
Platelets: 241 10*3/uL (ref 150–400)
RBC: 4.81 MIL/uL (ref 3.87–5.11)
RDW: 13.4 % (ref 11.5–15.5)
WBC: 11 10*3/uL — ABNORMAL HIGH (ref 4.0–10.5)

## 2016-03-10 LAB — BLOOD GAS, VENOUS
ACID-BASE EXCESS: 2.7 mmol/L — AB (ref 0.0–2.0)
BICARBONATE: 29 meq/L — AB (ref 20.0–24.0)
FIO2: 0.21
O2 SAT: 73.5 %
PATIENT TEMPERATURE: 98.6
TCO2: 25.8 mmol/L (ref 0–100)
pCO2, Ven: 53.4 mmHg — ABNORMAL HIGH (ref 45.0–50.0)
pH, Ven: 7.353 — ABNORMAL HIGH (ref 7.250–7.300)
pO2, Ven: 40.3 mmHg (ref 31.0–45.0)

## 2016-03-10 LAB — URINE MICROSCOPIC-ADD ON
Bacteria, UA: NONE SEEN
RBC / HPF: NONE SEEN RBC/hpf (ref 0–5)
WBC, UA: NONE SEEN WBC/hpf (ref 0–5)

## 2016-03-10 LAB — POTASSIUM: POTASSIUM: 3.3 mmol/L — AB (ref 3.5–5.1)

## 2016-03-10 MED ORDER — IPRATROPIUM-ALBUTEROL 0.5-2.5 (3) MG/3ML IN SOLN
3.0000 mL | Freq: Four times a day (QID) | RESPIRATORY_TRACT | Status: DC
  Administered 2016-03-10 – 2016-03-11 (×3): 3 mL via RESPIRATORY_TRACT
  Filled 2016-03-10 (×3): qty 3

## 2016-03-10 MED ORDER — DEXAMETHASONE SODIUM PHOSPHATE 10 MG/ML IJ SOLN
10.0000 mg | Freq: Once | INTRAMUSCULAR | Status: AC
  Administered 2016-03-10: 10 mg via INTRAVENOUS
  Filled 2016-03-10: qty 1

## 2016-03-10 MED ORDER — SODIUM CHLORIDE 0.9 % IV SOLN
Freq: Once | INTRAVENOUS | Status: AC
  Administered 2016-03-10: 01:00:00 via INTRAVENOUS

## 2016-03-10 MED ORDER — LEVOFLOXACIN 750 MG PO TABS: 750.0000 mg | ORAL_TABLET | Freq: Every day | ORAL | Status: DC

## 2016-03-10 MED ORDER — LOSARTAN POTASSIUM 50 MG PO TABS: 100.0000 mg | ORAL_TABLET | Freq: Every day | ORAL | Status: DC

## 2016-03-10 MED ORDER — MAGNESIUM SULFATE 2 GM/50ML IV SOLN
2.0000 g | Freq: Once | INTRAVENOUS | Status: AC
  Administered 2016-03-10: 2 g via INTRAVENOUS
  Filled 2016-03-10: qty 50

## 2016-03-10 MED ORDER — IPRATROPIUM-ALBUTEROL 0.5-2.5 (3) MG/3ML IN SOLN
3.0000 mL | RESPIRATORY_TRACT | Status: DC | PRN
  Administered 2016-03-12: 3 mL via RESPIRATORY_TRACT
  Filled 2016-03-10: qty 3

## 2016-03-10 MED ORDER — METFORMIN HCL 500 MG PO TABS
1000.0000 mg | ORAL_TABLET | Freq: Two times a day (BID) | ORAL | Status: DC
  Administered 2016-03-10: 1000 mg via ORAL
  Filled 2016-03-10: qty 2

## 2016-03-10 MED ORDER — OXYCODONE HCL 5 MG PO TABS
5.0000 mg | ORAL_TABLET | Freq: Four times a day (QID) | ORAL | Status: DC | PRN
  Administered 2016-03-10 – 2016-03-20 (×25): 5 mg via ORAL
  Filled 2016-03-10 (×26): qty 1

## 2016-03-10 MED ORDER — MAGNESIUM SULFATE 50 % IJ SOLN: 2.0000 g | Freq: Once | INTRAMUSCULAR | Status: DC

## 2016-03-10 MED ORDER — ALBUTEROL (5 MG/ML) CONTINUOUS INHALATION SOLN
15.0000 mg | INHALATION_SOLUTION | RESPIRATORY_TRACT | Status: DC
  Administered 2016-03-10: 15 mg via RESPIRATORY_TRACT
  Filled 2016-03-10: qty 20

## 2016-03-10 MED ORDER — ALPRAZOLAM 0.5 MG PO TABS
0.5000 mg | ORAL_TABLET | Freq: Every evening | ORAL | Status: DC | PRN
  Administered 2016-03-10 – 2016-03-20 (×10): 0.5 mg via ORAL
  Filled 2016-03-10 (×10): qty 1

## 2016-03-10 MED ORDER — LOSARTAN POTASSIUM-HCTZ 100-12.5 MG PO TABS: 1.0000 | ORAL_TABLET | Freq: Every day | ORAL | Status: DC

## 2016-03-10 MED ORDER — ENOXAPARIN SODIUM 40 MG/0.4ML ~~LOC~~ SOLN
40.0000 mg | Freq: Every day | SUBCUTANEOUS | Status: DC
  Administered 2016-03-10 – 2016-03-20 (×11): 40 mg via SUBCUTANEOUS
  Filled 2016-03-10 (×11): qty 0.4

## 2016-03-10 MED ORDER — HYDROCHLOROTHIAZIDE 12.5 MG PO CAPS: 12.5000 mg | ORAL_CAPSULE | Freq: Every day | ORAL | Status: DC

## 2016-03-10 MED ORDER — PANTOPRAZOLE SODIUM 40 MG PO TBEC
40.0000 mg | DELAYED_RELEASE_TABLET | Freq: Every day | ORAL | Status: DC
  Administered 2016-03-10 – 2016-03-20 (×11): 40 mg via ORAL
  Filled 2016-03-10 (×11): qty 1

## 2016-03-10 MED ORDER — IPRATROPIUM-ALBUTEROL 0.5-2.5 (3) MG/3ML IN SOLN
3.0000 mL | Freq: Once | RESPIRATORY_TRACT | Status: AC
  Administered 2016-03-10: 3 mL via RESPIRATORY_TRACT
  Filled 2016-03-10: qty 3

## 2016-03-10 MED ORDER — CEFUROXIME AXETIL 500 MG PO TABS
500.0000 mg | ORAL_TABLET | Freq: Two times a day (BID) | ORAL | Status: DC
  Administered 2016-03-10 – 2016-03-20 (×21): 500 mg via ORAL
  Filled 2016-03-10 (×25): qty 1

## 2016-03-10 MED ORDER — BUDESONIDE 0.5 MG/2ML IN SUSP
0.5000 mg | Freq: Two times a day (BID) | RESPIRATORY_TRACT | Status: DC
  Administered 2016-03-10 – 2016-03-20 (×21): 0.5 mg via RESPIRATORY_TRACT
  Filled 2016-03-10 (×21): qty 2

## 2016-03-10 NOTE — ED Provider Notes (Cosign Needed)
CSN: [id number]     Arrival date & time 03/10/16  0008 History   First MD Initiated Contact with Patient 03/10/16 0036     Chief Complaint  Patient presents with  . Shortness of Breath  . Flank Pain     (Consider location/radiation/quality/duration/timing/severity/associated sxs/prior Treatment) HPI  Past Medical History  Diagnosis Date  . Diabetes mellitus   . Hypertension   . COPD (chronic obstructive pulmonary disease) ([place])   . Chronic pain   . Anxiety   . Tracheobronchitis 01/01/2012  . High cholesterol   . OSA (obstructive sleep apnea)   . H/O: pneumonia   . GERD (gastroesophageal reflux disease)   . Colon polyps     adenomatous  . Diverticulosis   . Gall stones   . Diverticulitis   . Emphysema lung [name])    Past Surgical History  Procedure Laterality Date  . Appendectomy    . Cholecystectomy    . Abdominal hysterectomy    . Rotator cuff repair    . Carpal tunnel release Left   . Fracture surgery    . Bilateral shoulder surgery    . Right knee surgery    . Colonoscopy    . Esophagogastroduodenoscopy     Family History  Problem Relation Age of Onset  . Heart attack Mother   . Diabetes Maternal Grandfather   . Heart attack Maternal Grandfather   . Hypertension Maternal Grandfather   . Stroke Maternal Grandfather   . Thyroid disease Neg Hx   . Lung disease Neg Hx   . Heart attack Maternal Grandmother   . Hypertension Maternal Grandmother   . Alcohol abuse Father   . Colon cancer Neg Hx    Social History  Substance Use Topics  . Smoking status: Current Some Day Smoker -- 0.50 packs/day for 41 years    Types: Cigarettes    Start date: 03/19/1974  . Smokeless tobacco: Never Used     Comment: Peak rate of 1.5ppd, tobacco infor given  . Alcohol Use: No   OB History    No data available     Review of Systems    Allergies  Aspartame and phenylalanine; Doxycycline; Tramadol; Codeine; Neurontin; Sulfa antibiotics; Penicillins; and Prednisone  Home  Medications   Prior to Admission medications   Medication Sig Start Date End Date Taking? Authorizing Provider  albuterol (PROVENTIL HFA;VENTOLIN HFA) 108 (90 Base) MCG/ACT inhaler Inhale 1-2 puffs into the lungs every 4 (four) hours as needed. For shortness of breath. 01/25/16  Yes Golden Circle, FNP  ALPRAZolam Duanne Moron) 0.5 MG tablet Take 1 tablet (0.5 mg total) by mouth at bedtime as needed for anxiety. 02/25/16  Yes Hoyt Koch, MD  losartan-hydrochlorothiazide (HYZAAR) 100-12.5 MG tablet Take 1 tablet by mouth daily. 02/25/16  Yes Hoyt Koch, MD  metFORMIN (GLUCOPHAGE) 500 MG tablet Take 1 tablet (500 mg total) by mouth 2 (two) times daily with a meal. 01/25/16  Yes Golden Circle, FNP  pantoprazole (PROTONIX) 40 MG tablet Take 1 tablet (40 mg total) by mouth daily. 02/25/16  Yes Hoyt Koch, MD  tiotropium (SPIRIVA) 18 MCG inhalation capsule Place 1 capsule (18 mcg total) into inhaler and inhale daily. 12/18/15  Yes Kelvin Cellar, MD   BP 165/63 mmHg  Pulse 68  Temp(Src) 97.9 F (36.6 C) (Oral)  Resp 20  Ht 5\' 3"  (1.6 m)  Wt 79.47 kg  BMI 31.04 kg/m2  SpO2 94% Physical Exam  ED Course  Procedures (including critical  care time) Labs Review Labs Reviewed  URINALYSIS, ROUTINE W REFLEX MICROSCOPIC (NOT AT [facility]) - Abnormal; Notable for the following:    Glucose, UA >1000 (*)    All other components within normal limits  CBC WITH DIFFERENTIAL/PLATELET - Abnormal; Notable for the following:    WBC 11.0 (*)    All other components within normal limits  BLOOD GAS, VENOUS - Abnormal; Notable for the following:    pH, Ven 7.353 (*)    pCO2, Ven 53.4 (*)    Bicarbonate 29.0 (*)    Acid-Base Excess 2.7 (*)    All other components within normal limits  POTASSIUM - Abnormal; Notable for the following:    Potassium 3.3 (*)    All other components within normal limits  URINE MICROSCOPIC-ADD ON - Abnormal; Notable for the following:    Squamous Epithelial /  LPF 6-30 (*)    All other components within normal limits  BASIC METABOLIC PANEL - Abnormal; Notable for the following:    Potassium 3.3 (*)    Glucose, Bld 109 (*)    BUN 25 (*)    Creatinine, Ser 1.26 (*)    Calcium 8.2 (*)    GFR calc non Af Amer 43 (*)    GFR calc Af Amer 50 (*)    All other components within normal limits  CBC - Abnormal; Notable for the following:    WBC 10.7 (*)    All other components within normal limits  GLUCOSE, CAPILLARY - Abnormal; Notable for the following:    Glucose-Capillary 147 (*)    All other components within normal limits  GLUCOSE, CAPILLARY - Abnormal; Notable for the following:    Glucose-Capillary 119 (*)    All other components within normal limits  BASIC METABOLIC PANEL - Abnormal; Notable for the following:    Glucose, Bld 117 (*)    BUN 25 (*)    Creatinine, Ser 1.16 (*)    Calcium 8.4 (*)    GFR calc non Af Amer 47 (*)    GFR calc Af Amer 55 (*)    All other components within normal limits  GLUCOSE, CAPILLARY - Abnormal; Notable for the following:    Glucose-Capillary 133 (*)    All other components within normal limits  GLUCOSE, CAPILLARY - Abnormal; Notable for the following:    Glucose-Capillary 126 (*)    All other components within normal limits  GLUCOSE, CAPILLARY - Abnormal; Notable for the following:    Glucose-Capillary 141 (*)    All other components within normal limits  GLUCOSE, CAPILLARY - Abnormal; Notable for the following:    Glucose-Capillary 146 (*)    All other components within normal limits  GLUCOSE, CAPILLARY - Abnormal; Notable for the following:    Glucose-Capillary 163 (*)    All other components within normal limits  GLUCOSE, CAPILLARY - Abnormal; Notable for the following:    Glucose-Capillary 234 (*)    All other components within normal limits  BASIC METABOLIC PANEL - Abnormal; Notable for the following:    Glucose, Bld 159 (*)    BUN 32 (*)    Creatinine, Ser 1.12 (*)    GFR calc non Af Amer  49 (*)    GFR calc Af Amer 57 (*)    All other components within normal limits  GLUCOSE, CAPILLARY - Abnormal; Notable for the following:    Glucose-Capillary 169 (*)    All other components within normal limits  GLUCOSE, CAPILLARY - Abnormal; Notable for the  following:    Glucose-Capillary 240 (*)    All other components within normal limits  GLUCOSE, CAPILLARY - Abnormal; Notable for the following:    Glucose-Capillary 146 (*)    All other components within normal limits  GLUCOSE, CAPILLARY - Abnormal; Notable for the following:    Glucose-Capillary 187 (*)    All other components within normal limits  CBC - Abnormal; Notable for the following:    WBC 10.9 (*)    All other components within normal limits  BASIC METABOLIC PANEL - Abnormal; Notable for the following:    Glucose, Bld 115 (*)    BUN 36 (*)    Creatinine, Ser 1.22 (*)    Calcium 8.3 (*)    GFR calc non Af Amer 44 (*)    GFR calc Af Amer 52 (*)    All other components within normal limits  GLUCOSE, CAPILLARY - Abnormal; Notable for the following:    Glucose-Capillary 210 (*)    All other components within normal limits  GLUCOSE, CAPILLARY - Abnormal; Notable for the following:    Glucose-Capillary 134 (*)    All other components within normal limits  GLUCOSE, CAPILLARY - Abnormal; Notable for the following:    Glucose-Capillary 207 (*)    All other components within normal limits  GLUCOSE, CAPILLARY - Abnormal; Notable for the following:    Glucose-Capillary 145 (*)    All other components within normal limits  GLUCOSE, CAPILLARY - Abnormal; Notable for the following:    Glucose-Capillary 166 (*)    All other components within normal limits  GLUCOSE, CAPILLARY - Abnormal; Notable for the following:    Glucose-Capillary 128 (*)    All other components within normal limits  GLUCOSE, CAPILLARY - Abnormal; Notable for the following:    Glucose-Capillary 161 (*)    All other components within normal limits   CREATININE, SERUM - Abnormal; Notable for the following:    Creatinine, Ser 1.05 (*)    GFR calc non Af Amer 53 (*)    All other components within normal limits  GLUCOSE, CAPILLARY - Abnormal; Notable for the following:    Glucose-Capillary 171 (*)    All other components within normal limits  GLUCOSE, CAPILLARY - Abnormal; Notable for the following:    Glucose-Capillary 200 (*)    All other components within normal limits  GLUCOSE, CAPILLARY - Abnormal; Notable for the following:    Glucose-Capillary 167 (*)    All other components within normal limits  GLUCOSE, CAPILLARY - Abnormal; Notable for the following:    Glucose-Capillary 222 (*)    All other components within normal limits  GLUCOSE, CAPILLARY - Abnormal; Notable for the following:    Glucose-Capillary 250 (*)    All other components within normal limits  GLUCOSE, CAPILLARY - Abnormal; Notable for the following:    Glucose-Capillary 191 (*)    All other components within normal limits  GLUCOSE, CAPILLARY - Abnormal; Notable for the following:    Glucose-Capillary 122 (*)    All other components within normal limits  GLUCOSE, CAPILLARY - Abnormal; Notable for the following:    Glucose-Capillary 150 (*)    All other components within normal limits  GLUCOSE, CAPILLARY - Abnormal; Notable for the following:    Glucose-Capillary 253 (*)    All other components within normal limits  GLUCOSE, CAPILLARY - Abnormal; Notable for the following:    Glucose-Capillary 256 (*)    All other components within normal limits  GLUCOSE, CAPILLARY - Abnormal;  Notable for the following:    Glucose-Capillary 117 (*)    All other components within normal limits  GLUCOSE, CAPILLARY - Abnormal; Notable for the following:    Glucose-Capillary 161 (*)    All other components within normal limits  GLUCOSE, CAPILLARY - Abnormal; Notable for the following:    Glucose-Capillary 200 (*)    All other components within normal limits  GLUCOSE,  CAPILLARY - Abnormal; Notable for the following:    Glucose-Capillary 179 (*)    All other components within normal limits  GLUCOSE, CAPILLARY - Abnormal; Notable for the following:    Glucose-Capillary 114 (*)    All other components within normal limits  GLUCOSE, CAPILLARY - Abnormal; Notable for the following:    Glucose-Capillary 220 (*)    All other components within normal limits  GLUCOSE, CAPILLARY - Abnormal; Notable for the following:    Glucose-Capillary 229 (*)    All other components within normal limits  I-STAT CHEM 8, ED - Abnormal; Notable for the following:    Potassium 5.7 (*)    Chloride 99 (*)    BUN 25 (*)    Creatinine, Ser 1.20 (*)    Glucose, Bld 128 (*)    Calcium, Ion 1.09 (*)    All other components within normal limits    Imaging Review No results found. I have personally reviewed and evaluated these images and lab results as part of my medical decision-making.   EKG Interpretation   Date/Time:  Friday March 10 2016 00:33:25 EDT Ventricular Rate:  94 PR Interval:  149 QRS Duration: 74 QT Interval:  340 QTC Calculation: 425 R Axis:   37 Text Interpretation:  Sinus rhythm No significant change since last  tracing Confirmed by LITTLE MD, RACHEL [id number]) on 03/10/2016 1:56:03 AM      MDM   Final diagnoses:  COPD exacerbation ([place])  Hypoxia        Junius Creamer, NP 03/10/16 0502  Junius Creamer, NP 03/25/16 1203

## 2016-03-10 NOTE — ED Notes (Signed)
Patient presents SOB, non productive cough, bilateral rib pain, bilateral flank pain, color change in urine. Denies fever, N/V/D, dysuria.

## 2016-03-10 NOTE — Progress Notes (Signed)
Pt c/o feeling like her heart is "beating really fast and fluttery." RN assessed HR on telemetry monitor and it showed HR 104 ST. Pt given pain medication at this time for back pain. No other PRNs available. Pt does report taking Xanax at home PRN. Awaiting MD to round and write admission orders. Will continue to monitor pt and HR.  Othella Boyer [name] 03/10/2016

## 2016-03-10 NOTE — H&P (Signed)
History and Physical  DORLA MAKI [id number] DOB: 01-15-1946 DOA: 03/10/2016  Referring physician: ER PCP: Hoyt Koch, MD  Outpatient Specialists: Pulmonologist   Patient coming from: Home  Chief Complaint: SOB and cough  HPI: 70 year old female with history of COPD, tobacco use (continuous), HTN and DM. Patient presents with about one week history of progressively worsening COPD symptoms with associated cough and fever. Cough is said to be productive of yellowish sputum. No headache, no neck pain, no chest pain, no GI symptoms or urinary symptoms. Patient will be admitted for further assessment and management. Patient has clearly stated that she does not want cardiac or diabetic diet. There is also documented allergy to prednisone. Patient reports generalized and back pain and is requesting pain medication.  ED Course: Managed expectantly  Pertinent labs: Mild leukocytosis. ABG noted EKG: Independently reviewed.  Imaging: independently reviewed.   Review of Systems: As in HPI. 12 systems reviewed. Negative for visual changes, sore throat, rash, chest pain, dysuria, bleeding, n/v/abdominal pain.  Past Medical History  Diagnosis Date  . Diabetes mellitus   . Hypertension   . COPD (chronic obstructive pulmonary disease) ([place])   . Chronic pain   . Anxiety   . Tracheobronchitis 01/01/2012  . High cholesterol   . OSA (obstructive sleep apnea)   . H/O: pneumonia   . GERD (gastroesophageal reflux disease)   . Colon polyps     adenomatous  . Diverticulosis   . Gall stones   . Diverticulitis   . Emphysema lung [facility])     Past Surgical History  Procedure Laterality Date  . Appendectomy    . Cholecystectomy    . Abdominal hysterectomy    . Rotator cuff repair    . Carpal tunnel release Left   . Fracture surgery    . Bilateral shoulder surgery    . Right knee surgery    . Colonoscopy    . Esophagogastroduodenoscopy       reports that she has been smoking  Cigarettes.  She started smoking about 42 years ago. She has a 20.5 pack-year smoking history. She has never used smokeless tobacco. She reports that she does not drink alcohol or use illicit drugs.  Allergies  Allergen Reactions  . Aspartame And Phenylalanine Nausea And Vomiting    Patient says she is allergic to all artificial sweeteners  . Doxycycline Other (See Comments)    Nausea, vomiting, HA, double vision  . Tramadol Shortness Of Breath and Nausea Only  . Codeine Itching    Has not tried benadryl to alleviate side effects  . Neurontin [Gabapentin] Other (See Comments)    Dizzy, "drugged" feeling, sleepy  . Sulfa Antibiotics Other (See Comments)    Kidney problem   . Penicillins Rash    Has patient had a PCN reaction causing immediate rash, facial/tongue/throat swelling, SOB or lightheadedness with hypotension: No Has patient had a PCN reaction causing severe rash involving mucus membranes or skin necrosis: No Has patient had a PCN reaction that required hospitalization No Has patient had a PCN reaction occurring within the last 10 years: No If all of the above answers are "NO", then may proceed with Cephalosporin use.  . Prednisone Rash    Patient stated she received Prednisone while she was in the hospital and experienced a rash and "extreme pain.'    Family History  Problem Relation Age of Onset  . Heart attack Mother   . Diabetes Maternal Grandfather   . Heart attack  Maternal Grandfather   . Hypertension Maternal Grandfather   . Stroke Maternal Grandfather   . Thyroid disease Neg Hx   . Lung disease Neg Hx   . Heart attack Maternal Grandmother   . Hypertension Maternal Grandmother   . Alcohol abuse Father   . Colon cancer Neg Hx      Prior to Admission medications   Medication Sig Start Date End Date Taking? Authorizing Provider  albuterol (PROVENTIL HFA;VENTOLIN HFA) 108 (90 Base) MCG/ACT inhaler Inhale 1-2 puffs into the lungs every 4 (four) hours as needed.  For shortness of breath. 01/25/16  Yes Golden Circle, FNP  ALPRAZolam Duanne Moron) 0.5 MG tablet Take 1 tablet (0.5 mg total) by mouth at bedtime as needed for anxiety. 02/25/16  Yes Hoyt Koch, MD  ipratropium (ATROVENT) 0.02 % nebulizer solution Inhale 2.5 mLs into the lungs 2 (two) times daily. 02/25/16  Yes Historical Provider, MD  losartan-hydrochlorothiazide (HYZAAR) 100-12.5 MG tablet Take 1 tablet by mouth daily. 02/25/16  Yes Hoyt Koch, MD  metFORMIN (GLUCOPHAGE) 500 MG tablet Take 1 tablet (500 mg total) by mouth 2 (two) times daily with a meal. 01/25/16  Yes Golden Circle, FNP  pantoprazole (PROTONIX) 40 MG tablet Take 1 tablet (40 mg total) by mouth daily. 02/25/16  Yes Hoyt Koch, MD  tiotropium (SPIRIVA) 18 MCG inhalation capsule Place 1 capsule (18 mcg total) into inhaler and inhale daily. 12/18/15  Yes Kelvin Cellar, MD    Physical Exam: Filed Vitals:   03/10/16 0400 03/10/16 0408 03/10/16 0500 03/10/16 0622  BP: 105/50  104/57 146/50  Pulse: 79  115 114  Temp:    98.1 F (36.7 C)  TempSrc:    Oral  Resp: 22  27 22   Height:    5\' 3"  (1.6 m)  Weight:    76.2 kg (167 lb 15.9 oz)  SpO2: 94% 93% 96% 94%     Constitutional:  . Appears calm and comfortable. Eyes:  . PERRL and irises appear normal ENMT:  . external ears, nose appear normal Neck:  . neck is supple. No JVD. Respiratory:  . Decreased air entry globally with wheezing Cardiovascular:  . RRR, no m/r/g . No LE extremity edema   Abdomen:  . Abdomen appears normal; no tenderness or masses  Neurologic:  . Awake and alert. Moves all limbs.  Wt Readings from Last 3 Encounters:  03/10/16 76.2 kg (167 lb 15.9 oz)  02/29/16 75.751 kg (167 lb)  02/25/16 76.204 kg (168 lb)    I have personally reviewed following labs and imaging studies  Labs on Admission:  CBC:  Recent Labs Lab 03/10/16 0054 03/10/16 0103  WBC 11.0*  --   NEUTROABS 7.5  --   HGB 14.1 13.9  HCT 40.7 41.0    MCV 84.6  --   PLT 241  --    Basic Metabolic Panel:  Recent Labs Lab 03/10/16 0054 03/10/16 0103  NA  --  137  K 3.3* 5.7*  CL  --  99*  GLUCOSE  --  128*  BUN  --  25*  CREATININE  --  1.20*   Liver Function Tests: No results for input(s): AST, ALT, ALKPHOS, BILITOT, PROT, ALBUMIN in the last 168 hours. No results for input(s): LIPASE, AMYLASE in the last 168 hours. No results for input(s): AMMONIA in the last 168 hours. Coagulation Profile: No results for input(s): INR, PROTIME in the last 168 hours. Cardiac Enzymes: No results for input(s): CKTOTAL, CKMB,  CKMBINDEX, TROPONINI in the last 168 hours. BNP (last 3 results) No results for input(s): PROBNP in the last 8760 hours. HbA1C: No results for input(s): HGBA1C in the last 72 hours. CBG: No results for input(s): GLUCAP in the last 168 hours. Lipid Profile: No results for input(s): CHOL, HDL, LDLCALC, TRIG, CHOLHDL, LDLDIRECT in the last 72 hours. Thyroid Function Tests: No results for input(s): TSH, T4TOTAL, FREET4, T3FREE, THYROIDAB in the last 72 hours. Anemia Panel: No results for input(s): VITAMINB12, FOLATE, FERRITIN, TIBC, IRON, RETICCTPCT in the last 72 hours. Urine analysis:    Component Value Date/Time   COLORURINE YELLOW 03/10/2016 0921   APPEARANCEUR CLEAR 03/10/2016 0921   LABSPEC 1.015 03/10/2016 0921   PHURINE 5.5 03/10/2016 0921   GLUCOSEU >1000* 03/10/2016 0921   HGBUR NEGATIVE 03/10/2016 0921   BILIRUBINUR NEGATIVE 03/10/2016 0921   KETONESUR NEGATIVE 03/10/2016 0921   PROTEINUR NEGATIVE 03/10/2016 0921   UROBILINOGEN 1.0 06/16/2015 1545   NITRITE NEGATIVE 03/10/2016 0921   LEUKOCYTESUR NEGATIVE 03/10/2016 0921   Sepsis Labs: @LABRCNTIP (procalcitonin:4,lacticidven:4) )No results found for this or any previous visit (from the past 240 hour(s)).    Radiological Exams on Admission: Dg Chest 2 View  03/10/2016  CLINICAL DATA:  Initial evaluation for acute shortness of breath. History COPD.  Smoking. EXAM: CHEST  2 VIEW COMPARISON:  Prior radiograph from 12/12/2015. FINDINGS: Cardiac mediastinal silhouettes are stable in size and contour, and remain within normal limits. Changes related COPD present. Mild bibasilar atelectasis. No focal infiltrates identified. No pulmonary edema or pleural effusion. No pneumothorax. No acute osseous abnormality. IMPRESSION: 1. Mild bibasilar atelectasis. No other active cardiopulmonary disease. 2. COPD. Electronically Signed   By: Jeannine Boga M.D.   On: 03/10/2016 01:08    EKG: Independently reviewed.  Active Problems:   Hypoxia   COPD with exacerbation ([name])   Assessment/Plan 1. COPD with exacerbation 2. Acute on chronic respiratory failure, combined hypercarbia and hypoxemia 3. Possible bronchitis 4. Tobacco use 5. DM 6. Hypertension   Admit patient  Patient has refused diabetic and cardiac diet, refused sliding scale insulin. Documented allergy to prednisone  Nebs Duoneb  Oral antibiotics  Continue metformin, but increase dose  Optimize Blood pressure   DVT prophylaxis: Lovenox Code Status: Full Family Communication: Non available Disposition Plan: Home eventually   Consults called: None   Admission status: In patient    Time spent: 60 minutes  Dana Allan, MD  [facility] Pager #: [phone] 7PM-7AM contact night coverage as above  03/10/2016, 10:38 AM

## 2016-03-10 NOTE — ED Notes (Signed)
Pt states she cannot be put on a diabetic diet because she is allergic to aspartame which is found in many sugar free foods

## 2016-03-10 NOTE — ED Provider Notes (Signed)
CSN: [id number]     Arrival date & time 03/10/16  0008 History   First MD Initiated Contact with Patient 03/10/16 0036     Chief Complaint  Patient presents with  . Shortness of Breath  . Flank Pain     (Consider location/radiation/quality/duration/timing/severity/associated sxs/prior Treatment) HPI Comments: This is a 70 year old female with a history of severe COPD who presents with 7 days of worsening shortness of breath not resolving with her normal respiratory treatments she is not oxygen dependent at home she presented to the emergency room with an O2 sat of 83% with tachypnea and denies any fever states she's got diffuse myalgias  Patient is a 70 y.o. female presenting with shortness of breath and flank pain. The history is provided by the patient.  Shortness of Breath Severity:  Severe Onset quality:  Gradual Duration:  7 days Timing:  Constant Progression:  Worsening Chronicity:  Chronic Relieved by:  Nothing Worsened by:  Nothing tried Ineffective treatments:  None tried Associated symptoms: wheezing   Associated symptoms: no chest pain, no fever and no vomiting   Associated symptoms comment:  Myalgia Risk factors: tobacco use   Flank Pain Pertinent negatives include no chest pain, chills, fever, nausea, numbness or vomiting.    Past Medical History  Diagnosis Date  . Diabetes mellitus   . Hypertension   . COPD (chronic obstructive pulmonary disease) ([place])   . Chronic pain   . Anxiety   . Tracheobronchitis 01/01/2012  . High cholesterol   . OSA (obstructive sleep apnea)   . H/O: pneumonia   . GERD (gastroesophageal reflux disease)   . Colon polyps     adenomatous  . Diverticulosis   . Gall stones   . Diverticulitis   . Emphysema lung [facility])    Past Surgical History  Procedure Laterality Date  . Appendectomy    . Cholecystectomy    . Abdominal hysterectomy    . Rotator cuff repair    . Carpal tunnel release Left   . Fracture surgery    . Bilateral  shoulder surgery    . Right knee surgery    . Colonoscopy    . Esophagogastroduodenoscopy     Family History  Problem Relation Age of Onset  . Heart attack Mother   . Diabetes Maternal Grandfather   . Heart attack Maternal Grandfather   . Hypertension Maternal Grandfather   . Stroke Maternal Grandfather   . Thyroid disease Neg Hx   . Lung disease Neg Hx   . Heart attack Maternal Grandmother   . Hypertension Maternal Grandmother   . Alcohol abuse Father   . Colon cancer Neg Hx    Social History  Substance Use Topics  . Smoking status: Current Some Day Smoker -- 0.50 packs/day for 41 years    Types: Cigarettes    Start date: 03/19/1974  . Smokeless tobacco: Never Used     Comment: Peak rate of 1.5ppd, tobacco infor given  . Alcohol Use: No   OB History    No data available     Review of Systems  Constitutional: Negative for fever and chills.  Respiratory: Positive for shortness of breath and wheezing.   Cardiovascular: Negative for chest pain.  Gastrointestinal: Negative for nausea and vomiting.  Genitourinary: Positive for flank pain.  Neurological: Negative for dizziness and numbness.  All other systems reviewed and are negative.     Allergies  Aspartame and phenylalanine; Doxycycline; Tramadol; Codeine; Neurontin; Sulfa antibiotics; Penicillins; and Prednisone  Home  Medications   Prior to Admission medications   Medication Sig Start Date End Date Taking? Authorizing Provider  albuterol (PROVENTIL HFA;VENTOLIN HFA) 108 (90 Base) MCG/ACT inhaler Inhale 1-2 puffs into the lungs every 4 (four) hours as needed. For shortness of breath. 01/25/16  Yes Golden Circle, FNP  ALPRAZolam Duanne Moron) 0.5 MG tablet Take 1 tablet (0.5 mg total) by mouth at bedtime as needed for anxiety. 02/25/16  Yes Hoyt Koch, MD  ipratropium (ATROVENT) 0.02 % nebulizer solution Inhale 2.5 mLs into the lungs 2 (two) times daily. 02/25/16  Yes Historical Provider, MD   losartan-hydrochlorothiazide (HYZAAR) 100-12.5 MG tablet Take 1 tablet by mouth daily. 02/25/16  Yes Hoyt Koch, MD  metFORMIN (GLUCOPHAGE) 500 MG tablet Take 1 tablet (500 mg total) by mouth 2 (two) times daily with a meal. 01/25/16  Yes Golden Circle, FNP  pantoprazole (PROTONIX) 40 MG tablet Take 1 tablet (40 mg total) by mouth daily. 02/25/16  Yes Hoyt Koch, MD  tiotropium (SPIRIVA) 18 MCG inhalation capsule Place 1 capsule (18 mcg total) into inhaler and inhale daily. 12/18/15  Yes Kelvin Cellar, MD  ipratropium-albuterol (DUONEB) 0.5-2.5 (3) MG/3ML SOLN Take 3 mLs by nebulization 2 (two) times daily. Patient not taking: Reported on 03/10/2016 12/18/15   Kelvin Cellar, MD  Na Sulfate-K Sulfate-Mg Sulf 17.5-3.13-1.6 GM/180ML SOLN Take 1 kit by mouth once. Patient not taking: Reported on 03/10/2016 02/29/16   Irene Shipper, MD   BP 105/50 mmHg  Pulse 79  Temp(Src) 97.8 F (36.6 C) (Oral)  Resp 22  SpO2 93% Physical Exam  Constitutional: She is oriented to person, place, and time. She appears well-developed and well-nourished. No distress.  HENT:  Head: Normocephalic.  Eyes: Pupils are equal, round, and reactive to light.  Neck: Normal range of motion.  Cardiovascular: Normal rate and regular rhythm.   Pulmonary/Chest: Accessory muscle usage present. No respiratory distress. She has wheezes.  Abdominal: Soft.  Musculoskeletal: Normal range of motion.  Neurological: She is alert and oriented to person, place, and time.  Skin: Skin is warm and dry. She is not diaphoretic. There is pallor.  Nursing note and vitals reviewed.   ED Course  Procedures (including critical care time) Labs Review Labs Reviewed  CBC WITH DIFFERENTIAL/PLATELET - Abnormal; Notable for the following:    WBC 11.0 (*)    All other components within normal limits  BLOOD GAS, VENOUS - Abnormal; Notable for the following:    pH, Ven 7.353 (*)    pCO2, Ven 53.4 (*)    Bicarbonate 29.0 (*)     Acid-Base Excess 2.7 (*)    All other components within normal limits  POTASSIUM - Abnormal; Notable for the following:    Potassium 3.3 (*)    All other components within normal limits  I-STAT CHEM 8, ED - Abnormal; Notable for the following:    Potassium 5.7 (*)    Chloride 99 (*)    BUN 25 (*)    Creatinine, Ser 1.20 (*)    Glucose, Bld 128 (*)    Calcium, Ion 1.09 (*)    All other components within normal limits  URINALYSIS, ROUTINE W REFLEX MICROSCOPIC (NOT AT [facility])    Imaging Review Dg Chest 2 View  03/10/2016  CLINICAL DATA:  Initial evaluation for acute shortness of breath. History COPD. Smoking. EXAM: CHEST  2 VIEW COMPARISON:  Prior radiograph from 12/12/2015. FINDINGS: Cardiac mediastinal silhouettes are stable in size and contour, and remain within normal limits.  Changes related COPD present. Mild bibasilar atelectasis. No focal infiltrates identified. No pulmonary edema or pleural effusion. No pneumothorax. No acute osseous abnormality. IMPRESSION: 1. Mild bibasilar atelectasis. No other active cardiopulmonary disease. 2. COPD. Electronically Signed   By: Jeannine Boga M.D.   On: 03/10/2016 01:08   I have personally reviewed and evaluated these images and lab results as part of my medical decision-making.   EKG Interpretation   Date/Time:  Friday March 10 2016 00:33:25 EDT Ventricular Rate:  94 PR Interval:  149 QRS Duration: 74 QT Interval:  340 QTC Calculation: 425 R Axis:   37 Text Interpretation:  Sinus rhythm No significant change since last  tracing Confirmed by LITTLE MD, RACHEL [phone]) on 03/10/2016 1:56:03 AM    Patient reexamined during continuous neb she is no longer wheezing she is breathing easier with less work of her accessory muscles she will be admitted for COPD exacerbation hypoxemia she's been given IV Decadron which he appears to be tolerating without any complaints of pain exacerbation  MDM   Final diagnoses:  COPD exacerbation ([place])   Hypoxia         Junius Creamer, NP 03/10/16 0502  Sharlett Iles, MD 03/11/16 2326

## 2016-03-11 DIAGNOSIS — R0902 Hypoxemia: Secondary | ICD-10-CM

## 2016-03-11 LAB — BASIC METABOLIC PANEL
Anion gap: 8 (ref 5–15)
BUN: 25 mg/dL — ABNORMAL HIGH (ref 6–20)
CO2: 27 mmol/L (ref 22–32)
Calcium: 8.2 mg/dL — ABNORMAL LOW (ref 8.9–10.3)
Chloride: 106 mmol/L (ref 101–111)
Creatinine, Ser: 1.26 mg/dL — ABNORMAL HIGH (ref 0.44–1.00)
GFR calc Af Amer: 50 mL/min — ABNORMAL LOW (ref >60)
GFR calc non Af Amer: 43 mL/min — ABNORMAL LOW (ref >60)
Glucose, Bld: 109 mg/dL — ABNORMAL HIGH (ref 65–99)
Potassium: 3.3 mmol/L — ABNORMAL LOW (ref 3.5–5.1)
Sodium: 141 mmol/L (ref 135–145)

## 2016-03-11 LAB — CBC
HCT: 37 % (ref 36.0–46.0)
Hemoglobin: 12.2 g/dL (ref 12.0–15.0)
MCH: 29.1 pg (ref 26.0–34.0)
MCHC: 33 g/dL (ref 30.0–36.0)
MCV: 88.3 fL (ref 78.0–100.0)
Platelets: 241 10*3/uL (ref 150–400)
RBC: 4.19 MIL/uL (ref 3.87–5.11)
RDW: 13.6 % (ref 11.5–15.5)
WBC: 10.7 10*3/uL — ABNORMAL HIGH (ref 4.0–10.5)

## 2016-03-11 LAB — GLUCOSE, CAPILLARY
Glucose-Capillary: 147 mg/dL — ABNORMAL HIGH (ref 65–99)
Glucose-Capillary: 119 mg/dL — ABNORMAL HIGH (ref 65–99)

## 2016-03-11 MED ORDER — INSULIN ASPART 100 UNIT/ML ~~LOC~~ SOLN: 0.0000 [IU] | Freq: Three times a day (TID) | SUBCUTANEOUS | Status: DC

## 2016-03-11 MED ORDER — IPRATROPIUM-ALBUTEROL 0.5-2.5 (3) MG/3ML IN SOLN
3.0000 mL | Freq: Four times a day (QID) | RESPIRATORY_TRACT | Status: DC
Start: 2016-03-11 — End: 2016-03-11
  Administered 2016-03-11 (×3): 3 mL via RESPIRATORY_TRACT
  Filled 2016-03-11 (×3): qty 3

## 2016-03-11 MED ORDER — IPRATROPIUM-ALBUTEROL 0.5-2.5 (3) MG/3ML IN SOLN
3.0000 mL | Freq: Three times a day (TID) | RESPIRATORY_TRACT | Status: DC
  Administered 2016-03-12 – 2016-03-16 (×13): 3 mL via RESPIRATORY_TRACT
  Filled 2016-03-11 (×13): qty 3

## 2016-03-11 MED ORDER — POTASSIUM CHLORIDE CRYS ER 20 MEQ PO TBCR
40.0000 meq | EXTENDED_RELEASE_TABLET | Freq: Once | ORAL | Status: AC
  Administered 2016-03-11: 40 meq via ORAL
  Filled 2016-03-11: qty 2

## 2016-03-11 NOTE — Progress Notes (Signed)
PROGRESS NOTE    Victoria Bird  [id number] DOB: 1946-08-12 DOA: 03/10/2016 PCP: Hoyt Koch, MD    Brief Narrative: 70 year old female with history of COPD, tobacco use (continuous), HTN and DM. Patient presents with about one week history of progressively worsening COPD symptoms with associated cough and fever. Cough is said to be productive of yellowish sputum. Patient admitted for COPD exacerbation. Received a dose of decadron in the ED IV. Patient report she felt itching, tightness all over,    Assessment & Plan:   Active Problems:   Hypoxia   COPD with exacerbation ([name])  1-Acute COPD exacerbation.  Continue with pulmicort, has been tolerating this so far.  Continue with Ceftin.  Continue with albuterol  Taper oxygen off.  Acute on chronic respiratory failure, combined hypercarbia and hypoxemia; continue with treatment for number one.   DM; hold metformin while inpatient. SSI>   Hypertension; hold ACE and diuretic due to mild elevated cr Hypokalemia; replete orally   DVT prophylaxis: lovenox Code Status: full code.  Family Communication:care discussed with patient.  Disposition Plan: home in 1 or 2 days.     Consultants:   none  Procedures:  none  Antimicrobials:  ceftin 6-09   Subjective: She is feeling better, breathing better but not at baseline yet.  She does not uses oxygen at home.  Denies chest pain. She has been coughing.  She gets severe pain and itching and had a rash when she took prednisone.   Objective: Filed Vitals:   03/10/16 1211 03/10/16 1435 03/10/16 2132 03/11/16 0534  BP:  101/45 114/43 110/43  Pulse:  86 65 61  Temp:  98.3 F (36.8 C) 98.2 F (36.8 C) 97.7 F (36.5 C)  TempSrc:  Oral Oral Oral  Resp:  18 18 18   Height:      Weight:      SpO2: 95% 95% 98% 98%    Intake/Output Summary (Last 24 hours) at 03/11/16 1247 Last data filed at 03/10/16 1607  Gross per 24 hour  Intake      0 ml  Output    500  ml  Net   -500 ml   Filed Weights   03/10/16 0622  Weight: 76.2 kg (167 lb 15.9 oz)    Examination:  General exam: Appears calm and comfortable  Respiratory system: Clear to auscultation. Respiratory effort normal. Bilateral wheezing.  Cardiovascular system: S1 & S2 heard, RRR. No JVD, murmurs, rubs, gallops or clicks. No pedal edema. Gastrointestinal system: Abdomen is nondistended, soft and nontender. No organomegaly or masses felt. Normal bowel sounds heard. Central nervous system: Alert and oriented. No focal neurological deficits. Extremities: Symmetric 5 x 5 power. Skin: No rashes, lesions or ulcers Psychiatry: Judgement and insight appear normal. Mood & affect appropriate.     Data Reviewed: I have personally reviewed following labs and imaging studies  CBC:  Recent Labs Lab 03/10/16 0054 03/10/16 0103 03/11/16 0511  WBC 11.0*  --  10.7*  NEUTROABS 7.5  --   --   HGB 14.1 13.9 12.2  HCT 40.7 41.0 37.0  MCV 84.6  --  88.3  PLT 241  --  [id number]   Basic Metabolic Panel:  Recent Labs Lab 03/10/16 0054 03/10/16 0103 03/11/16 0511  NA  --  137 141  K 3.3* 5.7* 3.3*  CL  --  99* 106  CO2  --   --  27  GLUCOSE  --  128* 109*  BUN  --  25* 25*  CREATININE  --  1.20* 1.26*  CALCIUM  --   --  8.2*   GFR: Estimated Creatinine Clearance: 41.8 mL/min (by C-G formula based on Cr of 1.26). Liver Function Tests: No results for input(s): AST, ALT, ALKPHOS, BILITOT, PROT, ALBUMIN in the last 168 hours. No results for input(s): LIPASE, AMYLASE in the last 168 hours. No results for input(s): AMMONIA in the last 168 hours. Coagulation Profile: No results for input(s): INR, PROTIME in the last 168 hours. Cardiac Enzymes: No results for input(s): CKTOTAL, CKMB, CKMBINDEX, TROPONINI in the last 168 hours. BNP (last 3 results) No results for input(s): PROBNP in the last 8760 hours. HbA1C: No results for input(s): HGBA1C in the last 72 hours. CBG: No results for input(s):  GLUCAP in the last 168 hours. Lipid Profile: No results for input(s): CHOL, HDL, LDLCALC, TRIG, CHOLHDL, LDLDIRECT in the last 72 hours. Thyroid Function Tests: No results for input(s): TSH, T4TOTAL, FREET4, T3FREE, THYROIDAB in the last 72 hours. Anemia Panel: No results for input(s): VITAMINB12, FOLATE, FERRITIN, TIBC, IRON, RETICCTPCT in the last 72 hours. Sepsis Labs: No results for input(s): PROCALCITON, LATICACIDVEN in the last 168 hours.  No results found for this or any previous visit (from the past 240 hour(s)).       Radiology Studies: Dg Chest 2 View  03/10/2016  CLINICAL DATA:  Initial evaluation for acute shortness of breath. History COPD. Smoking. EXAM: CHEST  2 VIEW COMPARISON:  Prior radiograph from 12/12/2015. FINDINGS: Cardiac mediastinal silhouettes are stable in size and contour, and remain within normal limits. Changes related COPD present. Mild bibasilar atelectasis. No focal infiltrates identified. No pulmonary edema or pleural effusion. No pneumothorax. No acute osseous abnormality. IMPRESSION: 1. Mild bibasilar atelectasis. No other active cardiopulmonary disease. 2. COPD. Electronically Signed   By: Jeannine Boga M.D.   On: 03/10/2016 01:08        Scheduled Meds: . budesonide (PULMICORT) nebulizer solution  0.5 mg Nebulization BID  . cefUROXime  500 mg Oral BID WC  . enoxaparin (LOVENOX) injection  40 mg Subcutaneous Daily  . ipratropium-albuterol  3 mL Nebulization QID  . pantoprazole  40 mg Oral Daily   Continuous Infusions: . albuterol Stopped (03/10/16 0508)     LOS: 1 day    Time spent: 35 minutes.     Elmarie Shiley, MD [facility] Pager [phone]  If 7PM-7AM, please contact night-coverage [email or web address] Password [facility] 03/11/2016, 12:47 PM

## 2016-03-12 DIAGNOSIS — Z720 Tobacco use: Secondary | ICD-10-CM

## 2016-03-12 DIAGNOSIS — J441 Chronic obstructive pulmonary disease with (acute) exacerbation: Principal | ICD-10-CM

## 2016-03-12 DIAGNOSIS — R0600 Dyspnea, unspecified: Secondary | ICD-10-CM

## 2016-03-12 LAB — BASIC METABOLIC PANEL
Anion gap: 6 (ref 5–15)
BUN: 25 mg/dL — AB (ref 6–20)
CO2: 27 mmol/L (ref 22–32)
CREATININE: 1.16 mg/dL — AB (ref 0.44–1.00)
Calcium: 8.4 mg/dL — ABNORMAL LOW (ref 8.9–10.3)
Chloride: 108 mmol/L (ref 101–111)
GFR calc Af Amer: 55 mL/min — ABNORMAL LOW (ref >60)
GFR, EST NON AFRICAN AMERICAN: 47 mL/min — AB (ref >60)
Glucose, Bld: 117 mg/dL — ABNORMAL HIGH (ref 65–99)
POTASSIUM: 3.9 mmol/L (ref 3.5–5.1)
SODIUM: 141 mmol/L (ref 135–145)

## 2016-03-12 LAB — GLUCOSE, CAPILLARY
GLUCOSE-CAPILLARY: 133 mg/dL — AB (ref 65–99)
GLUCOSE-CAPILLARY: 146 mg/dL — AB (ref 65–99)
Glucose-Capillary: 126 mg/dL — ABNORMAL HIGH (ref 65–99)
Glucose-Capillary: 141 mg/dL — ABNORMAL HIGH (ref 65–99)

## 2016-03-12 MED ORDER — METHYLPREDNISOLONE SODIUM SUCC 40 MG IJ SOLR
40.0000 mg | Freq: Three times a day (TID) | INTRAMUSCULAR | Status: DC
  Administered 2016-03-12 – 2016-03-14 (×5): 40 mg via INTRAVENOUS
  Filled 2016-03-12 (×5): qty 1

## 2016-03-12 MED ORDER — LOSARTAN POTASSIUM-HCTZ 100-12.5 MG PO TABS: 1.0000 | ORAL_TABLET | Freq: Every day | ORAL | Status: DC

## 2016-03-12 MED ORDER — ALBUTEROL SULFATE (2.5 MG/3ML) 0.083% IN NEBU: 2.5000 mg | INHALATION_SOLUTION | RESPIRATORY_TRACT | Status: DC | PRN

## 2016-03-12 MED ORDER — HYDROCHLOROTHIAZIDE 12.5 MG PO CAPS
12.5000 mg | ORAL_CAPSULE | Freq: Every day | ORAL | Status: DC
  Administered 2016-03-12 – 2016-03-20 (×9): 12.5 mg via ORAL
  Filled 2016-03-12 (×9): qty 1

## 2016-03-12 MED ORDER — BENZONATATE 100 MG PO CAPS
200.0000 mg | ORAL_CAPSULE | Freq: Three times a day (TID) | ORAL | Status: DC | PRN
  Administered 2016-03-12 – 2016-03-16 (×4): 200 mg via ORAL
  Filled 2016-03-12 (×4): qty 2

## 2016-03-12 MED ORDER — MAGNESIUM SULFATE 2 GM/50ML IV SOLN
2.0000 g | Freq: Once | INTRAVENOUS | Status: AC
  Administered 2016-03-12: 2 g via INTRAVENOUS
  Filled 2016-03-12: qty 50

## 2016-03-12 MED ORDER — LOSARTAN POTASSIUM 50 MG PO TABS
100.0000 mg | ORAL_TABLET | Freq: Every day | ORAL | Status: DC
  Administered 2016-03-12 – 2016-03-20 (×9): 100 mg via ORAL
  Filled 2016-03-12 (×9): qty 2

## 2016-03-12 NOTE — Consult Note (Signed)
Name: Victoria Bird MRN: [id number] DOB: 1946-07-17    ADMISSION DATE:  03/10/2016 CONSULTATION DATE:  6/11  REFERRING MD :  Tyrell Antonio ([facility])   CHIEF COMPLAINT:  AECOPD   BRIEF PATIENT DESCRIPTION:  70yo female with hx DM, GERD, severe COPD (followed by Dr. Ashok Cordia, FEV1 47%) and ongoing tobacco abuse.  Presented 6/9 with progressive dyspnea, cough and fever.  She was admitted by [facility] and treated for AECOPD and possible bronchitis with oral abx, BD's.  On 6/11 she was still not improving and PCCM consulted to assist.    SIGNIFICANT EVENTS    STUDIES:     HISTORY OF PRESENT ILLNESS:  70yo female with hx DM, GERD, severe COPD (followed by Dr. Ashok Cordia, FEV1 47%) and ongoing tobacco abuse.  Presented 6/9 with progressive dyspnea, cough and fever.  She was admitted by [facility] and treated for AECOPD and possible bronchitis with oral abx, BD's.  On 6/11 she was still not improving and PCCM consulted to assist.  She denies any recent sick contacts or fevers or chills. However, she said that his fight being in the hospital her dyspnea has worsened. Specifically, this morning she notes feeling like she was choking. She says that during her hospitalization she has feeling more swollen. She notes that one of her diuretic medicines has been held. She reports that she is still smoking cigarettes, somewhere around the lines of 1-2 a day as she has dramatically cut back from 2 packs a day a few months ago.   PAST MEDICAL HISTORY :   has a past medical history of Diabetes mellitus; Hypertension; COPD (chronic obstructive pulmonary disease) ([place]); Chronic pain; Anxiety; Tracheobronchitis (01/01/2012); High cholesterol; OSA (obstructive sleep apnea); H/O: pneumonia; GERD (gastroesophageal reflux disease); Colon polyps; Diverticulosis; Gall stones; Diverticulitis; and Emphysema lung ([place]).  has past surgical history that includes Appendectomy; Cholecystectomy; Abdominal hysterectomy; Rotator cuff repair; Carpal  tunnel release (Left); Fracture surgery; Bilateral shoulder surgery; Right knee surgery; Colonoscopy; and Esophagogastroduodenoscopy. Prior to Admission medications   Medication Sig Start Date End Date Taking? Authorizing Provider  albuterol (PROVENTIL HFA;VENTOLIN HFA) 108 (90 Base) MCG/ACT inhaler Inhale 1-2 puffs into the lungs every 4 (four) hours as needed. For shortness of breath. 01/25/16  Yes Golden Circle, FNP  ALPRAZolam Duanne Moron) 0.5 MG tablet Take 1 tablet (0.5 mg total) by mouth at bedtime as needed for anxiety. 02/25/16  Yes Hoyt Koch, MD  ipratropium (ATROVENT) 0.02 % nebulizer solution Inhale 2.5 mLs into the lungs 2 (two) times daily. 02/25/16  Yes Historical Provider, MD  losartan-hydrochlorothiazide (HYZAAR) 100-12.5 MG tablet Take 1 tablet by mouth daily. 02/25/16  Yes Hoyt Koch, MD  metFORMIN (GLUCOPHAGE) 500 MG tablet Take 1 tablet (500 mg total) by mouth 2 (two) times daily with a meal. 01/25/16  Yes Golden Circle, FNP  pantoprazole (PROTONIX) 40 MG tablet Take 1 tablet (40 mg total) by mouth daily. 02/25/16  Yes Hoyt Koch, MD  tiotropium (SPIRIVA) 18 MCG inhalation capsule Place 1 capsule (18 mcg total) into inhaler and inhale daily. 12/18/15  Yes Kelvin Cellar, MD   Allergies  Allergen Reactions  . Aspartame And Phenylalanine Nausea And Vomiting    Patient says she is allergic to all artificial sweeteners  . Doxycycline Other (See Comments)    Nausea, vomiting, HA, double vision  . Tramadol Shortness Of Breath and Nausea Only  . Codeine Itching    Has not tried benadryl to alleviate side effects  . Neurontin [Gabapentin] Other (See  Comments)    Dizzy, "drugged" feeling, sleepy  . Sulfa Antibiotics Other (See Comments)    Kidney problem   . Penicillins Rash    Has patient had a PCN reaction causing immediate rash, facial/tongue/throat swelling, SOB or lightheadedness with hypotension: No Has patient had a PCN reaction causing severe  rash involving mucus membranes or skin necrosis: No Has patient had a PCN reaction that required hospitalization No Has patient had a PCN reaction occurring within the last 10 years: No If all of the above answers are "NO", then may proceed with Cephalosporin use.  . Prednisone Rash    Patient stated she received Prednisone while she was in the hospital and experienced a rash and "extreme pain.'    FAMILY HISTORY:  family history includes Alcohol abuse in her father; Diabetes in her maternal grandfather; Heart attack in her maternal grandfather, maternal grandmother, and mother; Hypertension in her maternal grandfather and maternal grandmother; Stroke in her maternal grandfather. There is no history of Thyroid disease, Lung disease, or Colon cancer. SOCIAL HISTORY:  reports that she has been smoking Cigarettes.  She started smoking about 42 years ago. She has a 20.5 pack-year smoking history. She has never used smokeless tobacco. She reports that she does not drink alcohol or use illicit drugs.  REVIEW OF SYSTEMS:   As per HPI - All other systems reviewed and were neg.    SUBJECTIVE: As above  VITAL SIGNS: Temp:  [97.7 F (36.5 C)-98.4 F (36.9 C)] 97.7 F (36.5 C) (06/11 0514) Pulse Rate:  [67-80] 71 (06/11 0514) Resp:  [18] 18 (06/11 0514) BP: (115-144)/(50-73) 141/73 mmHg (06/11 0514) SpO2:  [91 %-98 %] 92 % (06/11 0514)  PHYSICAL EXAMINATION: General:  Resting comfortably Neuro:  Awake, alert, oriented 4 HEENT:  Normocephalic atraumatic, oropharynx clear Cardiovascular:  Regular rate and rhythm, S4 gallop noted, no JVD Lungs:  Wheezing bilaterally, diminished air movement, frequent cough Abdomen:  Bowel sounds positive, nontender nondistended Musculoskeletal:  Normal bulk and tone Skin:  Trace ankle edema   Recent Labs Lab 03/10/16 0103 03/11/16 0511 03/12/16 0739  NA 137 141 141  K 5.7* 3.3* 3.9  CL 99* 106 108  CO2  --  27 27  BUN 25* 25* 25*  CREATININE 1.20*  1.26* 1.16*  GLUCOSE 128* 109* 117*    Recent Labs Lab 03/10/16 0054 03/10/16 0103 03/11/16 0511  HGB 14.1 13.9 12.2  HCT 40.7 41.0 37.0  WBC 11.0*  --  10.7*  PLT 241  --  241   No results found.  ASSESSMENT / PLAN:  Acute on chronic respiratory r/t AECOPD +/- acute bronchitis  Tobacco abuse ? Pulmonary edema ? Adverse reaction in past to prednisone but tolerance of solumedrol  PLAN -  I will add Solu-Medrol 3 doses then re-assess need for steroids on 6/12 Diuretic per primary service If no improvement 6/12 consider IV lasix Counsel to quit smoking Cont duonebs q6h, budesonide, PRN albuterol  F/u CXR  Pulmonary hygiene  PO ceftin    Roselie Awkward, MD [place] [facility] Pager: [phone] Cell: [phone] After 3pm or if no response, call [phone]

## 2016-03-12 NOTE — Progress Notes (Signed)
eLink Physician-Brief Progress Note Patient Name: Victoria Bird DOB: 04-15-1946 MRN: [id number]   Date of Service  03/12/2016  HPI/Events of Note  Increased wheezing per RT  eICU Interventions  Solumedrol 40 q8 per MD note earlier today     Intervention Category Intermediate Interventions: Medication change / dose adjustment  Deniz Hannan V. 03/12/2016, 8:23 PM

## 2016-03-12 NOTE — Progress Notes (Signed)
PROGRESS NOTE    Victoria Bird  [id number] DOB: October 09, 1945 DOA: 03/10/2016 PCP: Hoyt Koch, MD    Brief Narrative: 70 year old female with history of COPD, tobacco use (continuous), HTN and DM. Patient presents with about one week history of progressively worsening COPD symptoms with associated cough and fever. Cough is said to be productive of yellowish sputum. Patient admitted for COPD exacerbation. Received a dose of decadron in the ED IV. Patient report she felt itching, tightness all over,    Assessment & Plan:   Active Problems:   Hypoxia   COPD with exacerbation ([name])  1-Acute COPD exacerbation.  Continue with pulmicort, has been tolerating this so far.  Continue with Ceftin.  Continue with albuterol  Worsening dyspnea. Will order albuterol Q 2 hours PRN. Also will give magnesium.  Pulmonary consulted.   Acute on chronic respiratory failure, combined hypercarbia and hypoxemia; continue with treatment for number one.   DM; hold metformin while inpatient. SSI>   Hypertension; Resume  ACE and diuretic.  Hypokalemia; resolved.   DVT prophylaxis: lovenox Code Status: full code.  Family Communication:care discussed with patient.  Disposition Plan: home in 1 or 2 days.     Consultants:   Pulmonary   Procedures:  none  Antimicrobials:  ceftin 6-09   Subjective: She is not feeling well this am. Report worsening dyspnea. Feels difficulty breathing.  Still coughing a lot.  Feels she is accumulating fluids.   Objective: Filed Vitals:   03/11/16 1415 03/11/16 2008 03/11/16 2121 03/12/16 0514  BP: 115/50  144/57 141/73  Pulse: 67  80 71  Temp: 98.4 F (36.9 C)  97.8 F (36.6 C) 97.7 F (36.5 C)  TempSrc: Oral  Oral Oral  Resp:   18 18  Height:      Weight:      SpO2: 96% 98% 91% 92%    Intake/Output Summary (Last 24 hours) at 03/12/16 0923 Last data filed at 03/12/16 0515  Gross per 24 hour  Intake    360 ml  Output   1000 ml    Net   -640 ml   Filed Weights   03/10/16 0622  Weight: 76.2 kg (167 lb 15.9 oz)    Examination:  General exam: Appears calm and comfortable  Respiratory system:  Tachypnea . Bilateral wheezing.  Cardiovascular system: S1 & S2 heard, RRR. No JVD, murmurs, rubs, gallops or clicks. No pedal edema. Gastrointestinal system: Abdomen is nondistended, soft and nontender. No organomegaly or masses felt. Normal bowel sounds heard. Central nervous system: Alert and oriented. No focal neurological deficits. Extremities: Symmetric 5 x 5 power. Skin: No rashes, lesions or ulcers Psychiatry: Judgement and insight appear normal. Mood & affect appropriate.     Data Reviewed: I have personally reviewed following labs and imaging studies  CBC:  Recent Labs Lab 03/10/16 0054 03/10/16 0103 03/11/16 0511  WBC 11.0*  --  10.7*  NEUTROABS 7.5  --   --   HGB 14.1 13.9 12.2  HCT 40.7 41.0 37.0  MCV 84.6  --  88.3  PLT 241  --  [id number]   Basic Metabolic Panel:  Recent Labs Lab 03/10/16 0054 03/10/16 0103 03/11/16 0511 03/12/16 0739  NA  --  137 141 141  K 3.3* 5.7* 3.3* 3.9  CL  --  99* 106 108  CO2  --   --  27 27  GLUCOSE  --  128* 109* 117*  BUN  --  25* 25* 25*  CREATININE  --  1.20* 1.26* 1.16*  CALCIUM  --   --  8.2* 8.4*   GFR: Estimated Creatinine Clearance: 45.4 mL/min (by C-G formula based on Cr of 1.16). Liver Function Tests: No results for input(s): AST, ALT, ALKPHOS, BILITOT, PROT, ALBUMIN in the last 168 hours. No results for input(s): LIPASE, AMYLASE in the last 168 hours. No results for input(s): AMMONIA in the last 168 hours. Coagulation Profile: No results for input(s): INR, PROTIME in the last 168 hours. Cardiac Enzymes: No results for input(s): CKTOTAL, CKMB, CKMBINDEX, TROPONINI in the last 168 hours. BNP (last 3 results) No results for input(s): PROBNP in the last 8760 hours. HbA1C: No results for input(s): HGBA1C in the last 72 hours. CBG:  Recent  Labs Lab 03/11/16 1704 03/11/16 2244 03/12/16 0750  GLUCAP 147* 119* 133*   Lipid Profile: No results for input(s): CHOL, HDL, LDLCALC, TRIG, CHOLHDL, LDLDIRECT in the last 72 hours. Thyroid Function Tests: No results for input(s): TSH, T4TOTAL, FREET4, T3FREE, THYROIDAB in the last 72 hours. Anemia Panel: No results for input(s): VITAMINB12, FOLATE, FERRITIN, TIBC, IRON, RETICCTPCT in the last 72 hours. Sepsis Labs: No results for input(s): PROCALCITON, LATICACIDVEN in the last 168 hours.  No results found for this or any previous visit (from the past 240 hour(s)).       Radiology Studies: No results found.      Scheduled Meds: . budesonide (PULMICORT) nebulizer solution  0.5 mg Nebulization BID  . cefUROXime  500 mg Oral BID WC  . enoxaparin (LOVENOX) injection  40 mg Subcutaneous Daily  . losartan  100 mg Oral Daily   And  . hydrochlorothiazide  12.5 mg Oral Daily  . insulin aspart  0-9 Units Subcutaneous TID WC  . ipratropium-albuterol  3 mL Nebulization TID  . magnesium sulfate 1 - 4 g bolus IVPB  2 g Intravenous Once  . pantoprazole  40 mg Oral Daily   Continuous Infusions:     LOS: 2 days    Time spent: 35 minutes.     Elmarie Shiley, MD [facility] Pager [phone]  If 7PM-7AM, please contact night-coverage [email or web address] Password [facility] 03/12/2016, 9:23 AM

## 2016-03-13 DIAGNOSIS — J441 Chronic obstructive pulmonary disease with (acute) exacerbation: Secondary | ICD-10-CM

## 2016-03-13 LAB — GLUCOSE, CAPILLARY
GLUCOSE-CAPILLARY: 163 mg/dL — AB (ref 65–99)
GLUCOSE-CAPILLARY: 234 mg/dL — AB (ref 65–99)
Glucose-Capillary: 169 mg/dL — ABNORMAL HIGH (ref 65–99)
Glucose-Capillary: 240 mg/dL — ABNORMAL HIGH (ref 65–99)

## 2016-03-13 MED ORDER — METFORMIN HCL 500 MG PO TABS
500.0000 mg | ORAL_TABLET | Freq: Two times a day (BID) | ORAL | Status: DC
  Administered 2016-03-13 – 2016-03-20 (×15): 500 mg via ORAL
  Filled 2016-03-13 (×15): qty 1

## 2016-03-13 NOTE — Care Management Note (Signed)
Case Management Note  Patient Details  Name: Victoria Bird MRN: [id number] Date of Birth: 11-08-1945  Subjective/Objective:  70 y/o f admitted w/hypoxia. From home.Will monitor if home 02 needed.                  Action/Plan:d/c plan home.   Expected Discharge Date:                  Expected Discharge Plan:  Home/Self Care  In-House Referral:     Discharge planning Services  CM Consult  Post Acute Care Choice:    Choice offered to:     DME Arranged:    DME Agency:     HH Arranged:    HH Agency:     Status of Service:  In process, will continue to follow  Medicare Important Message Given:  Yes Date Medicare IM Given:    Medicare IM give by:    Date Additional Medicare IM Given:    Additional Medicare Important Message give by:     If discussed at [place] of Stay Meetings, dates discussed:    Additional Comments:  [name], RN 03/13/2016, 3:51 PM

## 2016-03-13 NOTE — Progress Notes (Signed)
PROGRESS NOTE    Victoria Bird  [id number] DOB: 1946-08-24 DOA: 03/10/2016 PCP: Hoyt Koch, MD    Brief Narrative: 70 year old female with history of COPD, tobacco use (continuous), HTN and DM. Patient presents with about one week history of progressively worsening COPD symptoms with associated cough and fever. Cough is said to be productive of yellowish sputum. Patient admitted for COPD exacerbation. Received a dose of decadron in the ED IV. Patient report she felt itching, tightness all over,    Assessment & Plan:   Active Problems:   Hypoxia   COPD with exacerbation ([name])  1-Acute COPD exacerbation.  Continue with pulmicort, has been tolerating this so far.  Continue with Ceftin.  Continue with albuterol  Tolerating Solumedrol 40 Mg IV Q 8 hr Improving.   Acute on chronic respiratory failure, combined hypercarbia and hypoxemia; continue with treatment for number one.    DM; patient wants to be back on her metformin. I explain to her that if she required CT with contrast , she wont be able to get it right away because of metformin use. She acepst the risk. Resume metformin.   Hypertension; Resume  ACE and diuretic.  Hypokalemia; resolved.   DVT prophylaxis: lovenox Code Status: full code.  Family Communication:care discussed with patient.  Disposition Plan: home in 1    Consultants:   Pulmonary   Procedures:  none  Antimicrobials:  ceftin 6-09   Subjective: She is breathing better.  Would like to be back on her metformin   Objective: Filed Vitals:   03/12/16 2018 03/12/16 2153 03/13/16 0820 03/13/16 0826  BP:  157/56    Pulse:  79    Temp:  98.3 F (36.8 C)    TempSrc:  Oral    Resp:  18    Height:      Weight:      SpO2: 96% 96% 92% 92%    Intake/Output Summary (Last 24 hours) at 03/13/16 1306 Last data filed at 03/13/16 0736  Gross per 24 hour  Intake    240 ml  Output   2225 ml  Net  -1985 ml   Filed Weights   03/10/16 0622 03/12/16 1657  Weight: 76.2 kg (167 lb 15.9 oz) 78.2 kg (172 lb 6.4 oz)    Examination:  General exam: Appears calm and comfortable  Respiratory system:  Normal respiratory effort, no significant wheezing.  Cardiovascular system: S1 & S2 heard, RRR. No JVD, murmurs, rubs, gallops or clicks. No pedal edema. Gastrointestinal system: Abdomen is nondistended, soft and nontender. No organomegaly or masses felt. Normal bowel sounds heard. Central nervous system: Alert and oriented. No focal neurological deficits. Extremities: Symmetric 5 x 5 power. Skin: No rashes, lesions or ulcers Psychiatry: Judgement and insight appear normal. Mood & affect appropriate.     Data Reviewed: I have personally reviewed following labs and imaging studies  CBC:  Recent Labs Lab 03/10/16 0054 03/10/16 0103 03/11/16 0511  WBC 11.0*  --  10.7*  NEUTROABS 7.5  --   --   HGB 14.1 13.9 12.2  HCT 40.7 41.0 37.0  MCV 84.6  --  88.3  PLT 241  --  [id number]   Basic Metabolic Panel:  Recent Labs Lab 03/10/16 0054 03/10/16 0103 03/11/16 0511 03/12/16 0739  NA  --  137 141 141  K 3.3* 5.7* 3.3* 3.9  CL  --  99* 106 108  CO2  --   --  27 27  GLUCOSE  --  128* 109* 117*  BUN  --  25* 25* 25*  CREATININE  --  1.20* 1.26* 1.16*  CALCIUM  --   --  8.2* 8.4*   GFR: Estimated Creatinine Clearance: 45.9 mL/min (by C-G formula based on Cr of 1.16). Liver Function Tests: No results for input(s): AST, ALT, ALKPHOS, BILITOT, PROT, ALBUMIN in the last 168 hours. No results for input(s): LIPASE, AMYLASE in the last 168 hours. No results for input(s): AMMONIA in the last 168 hours. Coagulation Profile: No results for input(s): INR, PROTIME in the last 168 hours. Cardiac Enzymes: No results for input(s): CKTOTAL, CKMB, CKMBINDEX, TROPONINI in the last 168 hours. BNP (last 3 results) No results for input(s): PROBNP in the last 8760 hours. HbA1C: No results for input(s): HGBA1C in the last 72  hours. CBG:  Recent Labs Lab 03/12/16 1201 03/12/16 1645 03/12/16 2151 03/13/16 0758 03/13/16 1200  GLUCAP 126* 141* 146* 163* 234*   Lipid Profile: No results for input(s): CHOL, HDL, LDLCALC, TRIG, CHOLHDL, LDLDIRECT in the last 72 hours. Thyroid Function Tests: No results for input(s): TSH, T4TOTAL, FREET4, T3FREE, THYROIDAB in the last 72 hours. Anemia Panel: No results for input(s): VITAMINB12, FOLATE, FERRITIN, TIBC, IRON, RETICCTPCT in the last 72 hours. Sepsis Labs: No results for input(s): PROCALCITON, LATICACIDVEN in the last 168 hours.  No results found for this or any previous visit (from the past 240 hour(s)).       Radiology Studies: No results found.      Scheduled Meds: . budesonide (PULMICORT) nebulizer solution  0.5 mg Nebulization BID  . cefUROXime  500 mg Oral BID WC  . enoxaparin (LOVENOX) injection  40 mg Subcutaneous Daily  . losartan  100 mg Oral Daily   And  . hydrochlorothiazide  12.5 mg Oral Daily  . insulin aspart  0-9 Units Subcutaneous TID WC  . ipratropium-albuterol  3 mL Nebulization TID  . metFORMIN  500 mg Oral BID WC  . methylPREDNISolone (SOLU-MEDROL) injection  40 mg Intravenous Q8H  . pantoprazole  40 mg Oral Daily   Continuous Infusions:     LOS: 3 days    Time spent: 35 minutes.     Elmarie Shiley, MD [facility] Pager [phone]  If 7PM-7AM, please contact night-coverage [email or web address] Password [email or web address] 03/13/2016, 1:06 PM

## 2016-03-13 NOTE — Consult Note (Signed)
Name: Victoria Bird MRN: [id number] DOB: [birth year]    ADMISSION DATE:  03/10/2016 CONSULTATION DATE:  6/11  REFERRING MD :  Tyrell Antonio ([facility])   CHIEF COMPLAINT:  AECOPD   BRIEF PATIENT DESCRIPTION:  70yo female with hx DM, GERD, severe COPD (followed by Dr. Ashok Cordia, FEV1 47%) and ongoing tobacco abuse.  Presented 6/9 with progressive dyspnea, cough and fever.  She was admitted by [facility] and treated for AECOPD and possible bronchitis with oral abx, BD's.  On 6/11 she was still not improving and PCCM consulted to assist.    SIGNIFICANT EVENTS   STUDIES:   HISTORY OF PRESENT ILLNESS:  70yo female with hx DM, GERD, severe COPD, active smoker (followed by Dr. Ashok Cordia, FEV1 47%) and ongoing tobacco abuse.  Presented 6/9 with progressive dyspnea, cough and fever.  She was admitted by [facility] and treated for AECOPD and possible bronchitis with oral abx, BD's.  On 6/11 she was still not improving and PCCM consulted to assist.  She denies any recent sick contacts or fevers or chills.   PAST MEDICAL HISTORY :   has a past medical history of Diabetes mellitus; Hypertension; COPD (chronic obstructive pulmonary disease) ([place]); Chronic pain; Anxiety; Tracheobronchitis (01/01/2012); High cholesterol; OSA (obstructive sleep apnea); H/O: pneumonia; GERD (gastroesophageal reflux disease); Colon polyps; Diverticulosis; Gall stones; Diverticulitis; and Emphysema lung ([place]).  has past surgical history that includes Appendectomy; Cholecystectomy; Abdominal hysterectomy; Rotator cuff repair; Carpal tunnel release (Left); Fracture surgery; Bilateral shoulder surgery; Right knee surgery; Colonoscopy; and Esophagogastroduodenoscopy. Prior to Admission medications   Medication Sig Start Date End Date Taking? Authorizing Provider  albuterol (PROVENTIL HFA;VENTOLIN HFA) 108 (90 Base) MCG/ACT inhaler Inhale 1-2 puffs into the lungs every 4 (four) hours as needed. For shortness of breath. 01/25/16  Yes Golden Circle, FNP    ALPRAZolam Duanne Moron) 0.5 MG tablet Take 1 tablet (0.5 mg total) by mouth at bedtime as needed for anxiety. 02/25/16  Yes Hoyt Koch, MD  ipratropium (ATROVENT) 0.02 % nebulizer solution Inhale 2.5 mLs into the lungs 2 (two) times daily. 02/25/16  Yes Historical Provider, MD  losartan-hydrochlorothiazide (HYZAAR) 100-12.5 MG tablet Take 1 tablet by mouth daily. 02/25/16  Yes Hoyt Koch, MD  metFORMIN (GLUCOPHAGE) 500 MG tablet Take 1 tablet (500 mg total) by mouth 2 (two) times daily with a meal. 01/25/16  Yes Golden Circle, FNP  pantoprazole (PROTONIX) 40 MG tablet Take 1 tablet (40 mg total) by mouth daily. 02/25/16  Yes Hoyt Koch, MD  tiotropium (SPIRIVA) 18 MCG inhalation capsule Place 1 capsule (18 mcg total) into inhaler and inhale daily. 12/18/15  Yes Kelvin Cellar, MD   Allergies  Allergen Reactions  . Aspartame And Phenylalanine Nausea And Vomiting    Patient says she is allergic to all artificial sweeteners  . Doxycycline Other (See Comments)    Nausea, vomiting, HA, double vision  . Tramadol Shortness Of Breath and Nausea Only  . Codeine Itching    Has not tried benadryl to alleviate side effects  . Neurontin [Gabapentin] Other (See Comments)    Dizzy, "drugged" feeling, sleepy  . Sulfa Antibiotics Other (See Comments)    Kidney problem   . Penicillins Rash    Has patient had a PCN reaction causing immediate rash, facial/tongue/throat swelling, SOB or lightheadedness with hypotension: No Has patient had a PCN reaction causing severe rash involving mucus membranes or skin necrosis: No Has patient had a PCN reaction that required hospitalization No Has patient had a PCN reaction  occurring within the last 10 years: No If all of the above answers are "NO", then may proceed with Cephalosporin use.  . Prednisone Rash    Patient stated she received Prednisone while she was in the hospital and experienced a rash and "extreme pain.'    FAMILY HISTORY:   family history includes Alcohol abuse in her father; Diabetes in her maternal grandfather; Heart attack in her maternal grandfather, maternal grandmother, and mother; Hypertension in her maternal grandfather and maternal grandmother; Stroke in her maternal grandfather. There is no history of Thyroid disease, Lung disease, or Colon cancer. SOCIAL HISTORY:  reports that she has been smoking Cigarettes.  She started smoking about 42 years ago. She has a 20.5 pack-year smoking history. She has never used smokeless tobacco. She reports that she does not drink alcohol or use illicit drugs.  REVIEW OF SYSTEMS:   C/O cough, dyspnea, wheezing. No sputum production, fevers, chills Denies chest pain, palpitations No nausea, vomiting, diarrhea, constipation. All other ROS are negative  SUBJECTIVE: As above  VITAL SIGNS: Temp:  [97.9 F (36.6 C)-98.3 F (36.8 C)] 98.3 F (36.8 C) (06/11 2153) Pulse Rate:  [70-79] 79 (06/11 2153) Resp:  [18] 18 (06/11 2153) BP: (131-157)/(56) 157/56 mmHg (06/11 2153) SpO2:  [92 %-96 %] 92 % (06/12 0826) Weight:  [172 lb 6.4 oz (78.2 kg)] 172 lb 6.4 oz (78.2 kg) (06/11 1657)  PHYSICAL EXAMINATION: General:  No distress Neuro:  Awake, alert, oriented 4, No focal deficits HEENT:  Moist mucus membranes, No thyromegaly, JVD Cardiovascular:  Regular rate and rhythm, No MRG Lungs:  B/L exp wheeze,  Abdomen:  Bowel sounds positive, nontender nondistended Musculoskeletal:  Normal bulk and tone Skin:  No edema   Recent Labs Lab 03/10/16 0103 03/11/16 0511 03/12/16 0739  NA 137 141 141  K 5.7* 3.3* 3.9  CL 99* 106 108  CO2  --  27 27  BUN 25* 25* 25*  CREATININE 1.20* 1.26* 1.16*  GLUCOSE 128* 109* 117*    Recent Labs Lab 03/10/16 0054 03/10/16 0103 03/11/16 0511  HGB 14.1 13.9 12.2  HCT 40.7 41.0 37.0  WBC 11.0*  --  10.7*  PLT 241  --  241   No results found.  ASSESSMENT / PLAN: Acute on chronic respiratory r/t AECOPD +/- acute bronchitis   Active Tobacco abuse ? Adverse reaction in past to prednisone but tolerance of solumedrol  PLAN -  Continue solumedrol 40 gm IV q8. No change to dose today Continue duonebs q6h, budesonide, PRN albuterol  Pulmonary hygiene  PO ceftin    Marshell Garfinkel MD [facility] Pager [phone] If no answer or after 3pm call: [phone] 03/13/2016, 9:30 AM

## 2016-03-13 NOTE — Care Management Important Message (Signed)
Important Message  Patient Details  Name: OUMOU HARRON MRN: [id number] Date of Birth: [birth year]   Medicare Important Message Given:  Yes    Camillo Flaming 03/13/2016, 10:13 AMImportant Message  Patient Details  Name: AHRIAH FRANKENBERGER MRN: [id number] Date of Birth: Dec 09, 1945   Medicare Important Message Given:  Yes    Camillo Flaming 03/13/2016, 10:13 AM

## 2016-03-14 LAB — BASIC METABOLIC PANEL
Anion gap: 6 (ref 5–15)
BUN: 32 mg/dL — ABNORMAL HIGH (ref 6–20)
CHLORIDE: 104 mmol/L (ref 101–111)
CO2: 26 mmol/L (ref 22–32)
Calcium: 9.1 mg/dL (ref 8.9–10.3)
Creatinine, Ser: 1.12 mg/dL — ABNORMAL HIGH (ref 0.44–1.00)
GFR calc non Af Amer: 49 mL/min — ABNORMAL LOW (ref >60)
GFR, EST AFRICAN AMERICAN: 57 mL/min — AB (ref >60)
Glucose, Bld: 159 mg/dL — ABNORMAL HIGH (ref 65–99)
POTASSIUM: 4.7 mmol/L (ref 3.5–5.1)
SODIUM: 136 mmol/L (ref 135–145)

## 2016-03-14 LAB — GLUCOSE, CAPILLARY
GLUCOSE-CAPILLARY: 146 mg/dL — AB (ref 65–99)
Glucose-Capillary: 187 mg/dL — ABNORMAL HIGH (ref 65–99)
Glucose-Capillary: 210 mg/dL — ABNORMAL HIGH (ref 65–99)

## 2016-03-14 MED ORDER — METHYLPREDNISOLONE SODIUM SUCC 40 MG IJ SOLR
40.0000 mg | Freq: Two times a day (BID) | INTRAMUSCULAR | Status: DC
  Administered 2016-03-14 – 2016-03-15 (×2): 40 mg via INTRAVENOUS
  Filled 2016-03-14 (×2): qty 1

## 2016-03-14 NOTE — Consult Note (Signed)
Name: Victoria Bird MRN: [id number] DOB: [birth year]    ADMISSION DATE:  03/10/2016 CONSULTATION DATE:  6/11  REFERRING MD :  Tyrell Antonio ([facility])   CHIEF COMPLAINT:  AECOPD   BRIEF PATIENT DESCRIPTION:  70yo female with hx DM, GERD, severe COPD (followed by Dr. Ashok Cordia, FEV1 47%) and ongoing tobacco abuse.  Presented 6/9 with progressive dyspnea, cough and fever.  She was admitted by [facility] and treated for AECOPD and possible bronchitis with oral abx, BD's.  On 6/11 she was still not improving and PCCM consulted to assist.    SIGNIFICANT EVENTS  Feels better. Had an episode of choking on food yesterday.  STUDIES:   HISTORY OF PRESENT ILLNESS:  70yo female with hx DM, GERD, severe COPD, active smoker (followed by Dr. Ashok Cordia, FEV1 47%) and ongoing tobacco abuse.  Presented 6/9 with progressive dyspnea, cough and fever.  She was admitted by [facility] and treated for AECOPD and possible bronchitis with oral abx, BD's.  On 6/11 she was still not improving and PCCM consulted to assist.  She denies any recent sick contacts or fevers or chills.   PAST MEDICAL HISTORY :   has a past medical history of Diabetes mellitus; Hypertension; COPD (chronic obstructive pulmonary disease) ([place]); Chronic pain; Anxiety; Tracheobronchitis (01/01/2012); High cholesterol; OSA (obstructive sleep apnea); H/O: pneumonia; GERD (gastroesophageal reflux disease); Colon polyps; Diverticulosis; Gall stones; Diverticulitis; and Emphysema lung ([place]).  has past surgical history that includes Appendectomy; Cholecystectomy; Abdominal hysterectomy; Rotator cuff repair; Carpal tunnel release (Left); Fracture surgery; Bilateral shoulder surgery; Right knee surgery; Colonoscopy; and Esophagogastroduodenoscopy. Prior to Admission medications   Medication Sig Start Date End Date Taking? Authorizing Provider  albuterol (PROVENTIL HFA;VENTOLIN HFA) 108 (90 Base) MCG/ACT inhaler Inhale 1-2 puffs into the lungs every 4 (four) hours as needed.  For shortness of breath. 01/25/16  Yes Golden Circle, FNP  ALPRAZolam Duanne Moron) 0.5 MG tablet Take 1 tablet (0.5 mg total) by mouth at bedtime as needed for anxiety. 02/25/16  Yes Hoyt Koch, MD  ipratropium (ATROVENT) 0.02 % nebulizer solution Inhale 2.5 mLs into the lungs 2 (two) times daily. 02/25/16  Yes Historical Provider, MD  losartan-hydrochlorothiazide (HYZAAR) 100-12.5 MG tablet Take 1 tablet by mouth daily. 02/25/16  Yes Hoyt Koch, MD  metFORMIN (GLUCOPHAGE) 500 MG tablet Take 1 tablet (500 mg total) by mouth 2 (two) times daily with a meal. 01/25/16  Yes Golden Circle, FNP  pantoprazole (PROTONIX) 40 MG tablet Take 1 tablet (40 mg total) by mouth daily. 02/25/16  Yes Hoyt Koch, MD  tiotropium (SPIRIVA) 18 MCG inhalation capsule Place 1 capsule (18 mcg total) into inhaler and inhale daily. 12/18/15  Yes Kelvin Cellar, MD   Allergies  Allergen Reactions  . Aspartame And Phenylalanine Nausea And Vomiting    Patient says she is allergic to all artificial sweeteners  . Doxycycline Other (See Comments)    Nausea, vomiting, HA, double vision  . Tramadol Shortness Of Breath and Nausea Only  . Codeine Itching    Has not tried benadryl to alleviate side effects  . Neurontin [Gabapentin] Other (See Comments)    Dizzy, "drugged" feeling, sleepy  . Sulfa Antibiotics Other (See Comments)    Kidney problem   . Penicillins Rash    Has patient had a PCN reaction causing immediate rash, facial/tongue/throat swelling, SOB or lightheadedness with hypotension: No Has patient had a PCN reaction causing severe rash involving mucus membranes or skin necrosis: No Has patient had a PCN reaction that  required hospitalization No Has patient had a PCN reaction occurring within the last 10 years: No If all of the above answers are "NO", then may proceed with Cephalosporin use.  . Prednisone Rash    Patient stated she received Prednisone while she was in the hospital and  experienced a rash and "extreme pain.'    FAMILY HISTORY:  family history includes Alcohol abuse in her father; Diabetes in her maternal grandfather; Heart attack in her maternal grandfather, maternal grandmother, and mother; Hypertension in her maternal grandfather and maternal grandmother; Stroke in her maternal grandfather. There is no history of Thyroid disease, Lung disease, or Colon cancer. SOCIAL HISTORY:  reports that she has been smoking Cigarettes.  She started smoking about 42 years ago. She has a 20.5 pack-year smoking history. She has never used smokeless tobacco. She reports that she does not drink alcohol or use illicit drugs.  REVIEW OF SYSTEMS:   C/O cough, dyspnea, wheezing. No sputum production, fevers, chills Denies chest pain, palpitations No nausea, vomiting, diarrhea, constipation. All other ROS are negative  SUBJECTIVE: As above  VITAL SIGNS: Temp:  [97.3 F (36.3 C)-98.3 F (36.8 C)] 97.3 F (36.3 C) (06/13 0523) Pulse Rate:  [57-83] 57 (06/13 0523) Resp:  [18] 18 (06/13 0523) BP: (136-161)/(55-72) 136/55 mmHg (06/13 0523) SpO2:  [94 %-98 %] 94 % (06/13 0806) Weight:  [173 lb 8 oz (78.7 kg)-173 lb 15.1 oz (78.9 kg)] 173 lb 15.1 oz (78.9 kg) (06/13 0826)  PHYSICAL EXAMINATION: General:  No distress Neuro:  Awake, alert, oriented 4, No focal deficits HEENT:  Moist mucus membranes, No thyromegaly, JVD Cardiovascular:  Regular rate and rhythm, No MRG Lungs:  No wheeze, crackles Abdomen:  Bowel sounds positive, nontender nondistended Musculoskeletal:  Normal bulk and tone Skin:  No edema   Recent Labs Lab 03/11/16 0511 03/12/16 0739 03/14/16 0504  NA 141 141 136  K 3.3* 3.9 4.7  CL 106 108 104  CO2 27 27 26   BUN 25* 25* 32*  CREATININE 1.26* 1.16* 1.12*  GLUCOSE 109* 117* 159*    Recent Labs Lab 03/10/16 0054 03/10/16 0103 03/11/16 0511  HGB 14.1 13.9 12.2  HCT 40.7 41.0 37.0  WBC 11.0*  --  10.7*  PLT 241  --  241   No results  found.  ASSESSMENT / PLAN: Acute on chronic respiratory r/t AECOPD +/- acute bronchitis > slow improvement. Active Tobacco abuse ? Adverse reaction in past to prednisone but tolerance of solumedrol  PLAN -  Continue solumedrol. Taper to 40 gm IV q12.  Continue duonebs q6h, budesonide, PRN albuterol  Pulmonary hygiene  PO ceftin   Swallow eval to assess aspiration risk  Marshell Garfinkel MD [facility] Pager [phone] If no answer or after 3pm call: [phone] 03/14/2016, 9:59 AM

## 2016-03-14 NOTE — Progress Notes (Signed)
PROGRESS NOTE    Victoria Bird  [id number] DOB: 28-Aug-1946 DOA: 03/10/2016 PCP: Hoyt Koch, MD    Brief Narrative: 70 year old female with history of COPD, tobacco use (continuous), HTN and DM. Patient presents with about one week history of progressively worsening COPD symptoms with associated cough and fever. Cough is said to be productive of yellowish sputum. Patient admitted for COPD exacerbation. Received a dose of decadron in the ED IV. Patient report she felt itching, tightness all over,   Patient admitted with COPD exacerbation. She was not improving, CCM consulted. She report intolerance, allergy to prednisone. Has been tolerating IV Solumedrol.    Assessment & Plan:   Active Problems:   Hypoxia   COPD with exacerbation ([name])   COPD exacerbation ([name])  1-Acute COPD exacerbation.  Continue with pulmicort, has been tolerating this so far.  Continue with Ceftin.  Continue with albuterol  Tolerating Solumedrol 40 Mg IV , change today to BID.  Improving.   Acute on chronic respiratory failure, combined hypercarbia and hypoxemia; continue with treatment for number one.    DM; patient wants to be back on her metformin. I explain to her that if she required CT with contrast , she wont be able to get it right away because of metformin use. She acepst the risk. Resume metformin.   Hypertension; Resume  ACE and diuretic. Monitor renal function  Hypokalemia; resolved.   Dysphagia; continue with protonix. Speech evaluation to follow.   DVT prophylaxis: lovenox Code Status: full code.  Family Communication: care discussed with patient.  Disposition Plan: when ok with CCM     Consultants:   Pulmonary   Procedures:  none  Antimicrobials:  ceftin 6-09   Subjective: Dyspnea improved. Had shocking episode last night while eating dinner.  Just spoke with speech therapist   Objective: Filed Vitals:   03/14/16 0801 03/14/16 0806 03/14/16 0826  03/14/16 1312  BP:    144/57  Pulse:    72  Temp:    98.1 F (36.7 C)  TempSrc:    Oral  Resp:    18  Height:      Weight:   78.9 kg (173 lb 15.1 oz)   SpO2: 94% 94%  94%    Intake/Output Summary (Last 24 hours) at 03/14/16 1412 Last data filed at 03/14/16 1219  Gross per 24 hour  Intake    720 ml  Output   1850 ml  Net  -1130 ml   Filed Weights   03/12/16 1657 03/13/16 1901 03/14/16 0826  Weight: 78.2 kg (172 lb 6.4 oz) 78.7 kg (173 lb 8 oz) 78.9 kg (173 lb 15.1 oz)    Examination:  General exam: Appears calm and comfortable  Respiratory system:  Normal respiratory effort, no significant wheezing.  Cardiovascular system: S1 & S2 heard, RRR. No JVD, murmurs, rubs, gallops or clicks. No pedal edema. Gastrointestinal system: Abdomen is nondistended, soft and nontender. No organomegaly or masses felt. Normal bowel sounds heard. Central nervous system: Alert and oriented. No focal neurological deficits. Extremities: Symmetric 5 x 5 power. Skin: No rashes, lesions or ulcers Psychiatry: Judgement and insight appear normal. Mood & affect appropriate.     Data Reviewed: I have personally reviewed following labs and imaging studies  CBC:  Recent Labs Lab 03/10/16 0054 03/10/16 0103 03/11/16 0511  WBC 11.0*  --  10.7*  NEUTROABS 7.5  --   --   HGB 14.1 13.9 12.2  HCT 40.7 41.0 37.0  MCV  84.6  --  88.3  PLT 241  --  [id number]   Basic Metabolic Panel:  Recent Labs Lab 03/10/16 0054 03/10/16 0103 03/11/16 0511 03/12/16 0739 03/14/16 0504  NA  --  137 141 141 136  K 3.3* 5.7* 3.3* 3.9 4.7  CL  --  99* 106 108 104  CO2  --   --  27 27 26   GLUCOSE  --  128* 109* 117* 159*  BUN  --  25* 25* 25* 32*  CREATININE  --  1.20* 1.26* 1.16* 1.12*  CALCIUM  --   --  8.2* 8.4* 9.1   GFR: Estimated Creatinine Clearance: 47.8 mL/min (by C-G formula based on Cr of 1.12). Liver Function Tests: No results for input(s): AST, ALT, ALKPHOS, BILITOT, PROT, ALBUMIN in the last 168  hours. No results for input(s): LIPASE, AMYLASE in the last 168 hours. No results for input(s): AMMONIA in the last 168 hours. Coagulation Profile: No results for input(s): INR, PROTIME in the last 168 hours. Cardiac Enzymes: No results for input(s): CKTOTAL, CKMB, CKMBINDEX, TROPONINI in the last 168 hours. BNP (last 3 results) No results for input(s): PROBNP in the last 8760 hours. HbA1C: No results for input(s): HGBA1C in the last 72 hours. CBG:  Recent Labs Lab 03/13/16 0758 03/13/16 1200 03/13/16 1706 03/13/16 2152 03/14/16 0723  GLUCAP 163* 234* 169* 240* 146*   Lipid Profile: No results for input(s): CHOL, HDL, LDLCALC, TRIG, CHOLHDL, LDLDIRECT in the last 72 hours. Thyroid Function Tests: No results for input(s): TSH, T4TOTAL, FREET4, T3FREE, THYROIDAB in the last 72 hours. Anemia Panel: No results for input(s): VITAMINB12, FOLATE, FERRITIN, TIBC, IRON, RETICCTPCT in the last 72 hours. Sepsis Labs: No results for input(s): PROCALCITON, LATICACIDVEN in the last 168 hours.  No results found for this or any previous visit (from the past 240 hour(s)).       Radiology Studies: No results found.      Scheduled Meds: . budesonide (PULMICORT) nebulizer solution  0.5 mg Nebulization BID  . cefUROXime  500 mg Oral BID WC  . enoxaparin (LOVENOX) injection  40 mg Subcutaneous Daily  . losartan  100 mg Oral Daily   And  . hydrochlorothiazide  12.5 mg Oral Daily  . insulin aspart  0-9 Units Subcutaneous TID WC  . ipratropium-albuterol  3 mL Nebulization TID  . metFORMIN  500 mg Oral BID WC  . methylPREDNISolone (SOLU-MEDROL) injection  40 mg Intravenous Q12H  . pantoprazole  40 mg Oral Daily   Continuous Infusions:     LOS: 4 days    Time spent: 35 minutes.     Elmarie Shiley, MD [facility] Pager [phone]  If 7PM-7AM, please contact night-coverage [email or web address] Password [email or web address] 03/14/2016, 2:12 PM

## 2016-03-14 NOTE — Evaluation (Signed)
Clinical/Bedside Swallow Evaluation Patient Details  Name: Victoria Bird MRN: [id number] Date of Birth: [birth year]  Today's Date: 03/14/2016 Time: SLP Start Time (ACUTE ONLY): 1115 SLP Stop Time (ACUTE ONLY): 1200 SLP Time Calculation (min) (ACUTE ONLY): 45 min  Past Medical History:  Past Medical History  Diagnosis Date  . Diabetes mellitus   . Hypertension   . COPD (chronic obstructive pulmonary disease) ([place])   . Chronic pain   . Anxiety   . Tracheobronchitis 01/01/2012  . High cholesterol   . OSA (obstructive sleep apnea)   . H/O: pneumonia   . GERD (gastroesophageal reflux disease)   . Colon polyps     adenomatous  . Diverticulosis   . Gall stones   . Diverticulitis   . Emphysema lung [facility])    Past Surgical History:  Past Surgical History  Procedure Laterality Date  . Appendectomy    . Cholecystectomy    . Abdominal hysterectomy    . Rotator cuff repair    . Carpal tunnel release Left   . Fracture surgery    . Bilateral shoulder surgery    . Right knee surgery    . Colonoscopy    . Esophagogastroduodenoscopy     HPI:  pt is a 70 yo woman with h/o COPD adm to [facility] with fever/cough.  PMH + for COPD, diverticulitis, thyroid nodules, GERD, bronchitis. Pt had a choking episode on chicken last night which she reported caused her to lose her breath.  Pt also admits to prior issue choking on corn approximately 3 weeks ago.  She states choking occurs infrequently.  H/O EGD in 2013 in [place], uncertain of results.     Assessment / Plan / Recommendation Clinical Impression  Pt known to this SLP from 2013 evaluation.  Pt with negative CN exam and good tolerance of po observed *graham crackers, gingerale, medicine with water.  Swallow was timely with no indication of airway compromise or residuals.  Occasional choking *x2 in 4 weeks* reported by pt occured on chicken and corn - Known h/o reflux and ? thyroid nodules may contribute to symptoms.  Reflux Symptom Index  (RSI) given to pt with her scoring 26/45- highly indicative of reflux to larynx per study authors.   Provided pt with reflux precautions in writing and encouraged her to share them with her GI MD for approval.   Given pt h/o GERD, xerostomia and respiratory deficits - SLP educated her to mitigation strategies verbally and in writing.  Pt stated "I'm doing a lot of these things" causing SLP to suspect level of chronic dysphagia.   Will follow up x1 to assure tolerance of po and education completed.    Of note, pt also states when she is home sick, she does not eat much nor the same foods as here in the hospital.      Aspiration Risk  Mild aspiration risk    Diet Recommendation Regular;Thin liquid   Liquid Administration via: Cup;Straw Medication Administration: Whole meds with liquid (large pills with peanut butter) Supervision: Patient able to self feed Compensations: Slow rate;Small sips/bites (start meals with liquids) Postural Changes: Seated upright at 90 degrees;Remain upright for at least 30 minutes after po intake    Other  Recommendations Oral Care Recommendations: Oral care BID   Follow up Recommendations    n/a   Frequency and Duration min 1 x/week  1 week       Prognosis Prognosis for Safe Diet Advancement: Good Barriers to Reach Goals: Time  post onset      Swallow Study   General Date of Onset: 03/14/16 HPI: pt is a 70 yo woman with h/o COPD adm to [facility] with fever/cough.  PMH + for COPD, diverticulitis, thyroid nodules, GERD, bronchitis. Pt had a choking episode on chicken last night which she reported caused her to lose her breath.  Pt also admits to prior issue choking on corn approximately 3 weeks ago.  She states choking occurs infrequently.  H/O EGD in 2013 in [place], uncertain of results.   Type of Study: Bedside Swallow Evaluation Diet Prior to this Study: Regular;Thin liquids Temperature Spikes Noted: No Respiratory Status: Room air History of Recent  Intubation: No Behavior/Cognition: Alert;Cooperative;Pleasant mood Oral Cavity Assessment: Erythema (posterior) Oral Cavity - Dentition: Dentures, top (4 lower teeth only) Vision: Functional for self-feeding Self-Feeding Abilities: Able to feed self Patient Positioning: Upright in bed Baseline Vocal Quality: Hoarse Volitional Cough: Strong Volitional Swallow: Able to elicit    Oral/Motor/Sensory Function Overall Oral Motor/Sensory Function: Within functional limits   Ice Chips Ice chips: Not tested   Thin Liquid Thin Liquid: Within functional limits Presentation: Straw    Nectar Thick Nectar Thick Liquid: Not tested   Honey Thick Honey Thick Liquid: Not tested   Puree Puree: Not tested   Solid   GO   Solid: Within functional limits Presentation: [place], [place] [facility] SLP [phone]

## 2016-03-15 LAB — CBC
HCT: 37.6 % (ref 36.0–46.0)
HEMOGLOBIN: 12.4 g/dL (ref 12.0–15.0)
MCH: 28.4 pg (ref 26.0–34.0)
MCHC: 33 g/dL (ref 30.0–36.0)
MCV: 86 fL (ref 78.0–100.0)
PLATELETS: 261 10*3/uL (ref 150–400)
RBC: 4.37 MIL/uL (ref 3.87–5.11)
RDW: 13.6 % (ref 11.5–15.5)
WBC: 10.9 10*3/uL — AB (ref 4.0–10.5)

## 2016-03-15 LAB — BASIC METABOLIC PANEL
Anion gap: 6 (ref 5–15)
BUN: 36 mg/dL — AB (ref 6–20)
CALCIUM: 8.3 mg/dL — AB (ref 8.9–10.3)
CO2: 28 mmol/L (ref 22–32)
Chloride: 106 mmol/L (ref 101–111)
Creatinine, Ser: 1.22 mg/dL — ABNORMAL HIGH (ref 0.44–1.00)
GFR calc Af Amer: 52 mL/min — ABNORMAL LOW (ref >60)
GFR, EST NON AFRICAN AMERICAN: 44 mL/min — AB (ref >60)
GLUCOSE: 115 mg/dL — AB (ref 65–99)
Potassium: 3.6 mmol/L (ref 3.5–5.1)
Sodium: 140 mmol/L (ref 135–145)

## 2016-03-15 LAB — GLUCOSE, CAPILLARY
GLUCOSE-CAPILLARY: 166 mg/dL — AB (ref 65–99)
Glucose-Capillary: 134 mg/dL — ABNORMAL HIGH (ref 65–99)
Glucose-Capillary: 207 mg/dL — ABNORMAL HIGH (ref 65–99)
Glucose-Capillary: 145 mg/dL — ABNORMAL HIGH (ref 65–99)

## 2016-03-15 MED ORDER — METHYLPREDNISOLONE SODIUM SUCC 40 MG IJ SOLR
40.0000 mg | Freq: Every day | INTRAMUSCULAR | Status: DC
  Administered 2016-03-16: 40 mg via INTRAVENOUS
  Filled 2016-03-15: qty 1

## 2016-03-15 NOTE — Consult Note (Addendum)
Name: Victoria Bird MRN: [id number] DOB: [birth year]    ADMISSION DATE:  03/10/2016 CONSULTATION DATE:  6/11  REFERRING MD :  Tyrell Antonio ([facility])   CHIEF COMPLAINT:  AECOPD   BRIEF PATIENT DESCRIPTION:  70yo female with hx DM, GERD, severe COPD (followed by Dr. Ashok Cordia, FEV1 47%) and ongoing tobacco abuse.  Presented 6/9 with progressive dyspnea, cough and fever.  She was admitted by [facility] and treated for AECOPD and possible bronchitis with oral abx, BD's.  On 6/11 she was still not improving and PCCM consulted to assist.    SIGNIFICANT EVENTS  Feels better. Had an episode of choking on food yesterday.  STUDIES:   HISTORY OF PRESENT ILLNESS:  70yo female with hx DM, GERD, severe COPD, active smoker (followed by Dr. Ashok Cordia, FEV1 47%) and ongoing tobacco abuse.  Presented 6/9 with progressive dyspnea, cough and fever.  She was admitted by [facility] and treated for AECOPD and possible bronchitis with oral abx, BD's.  On 6/11 she was still not improving and PCCM consulted to assist.  She denies any recent sick contacts or fevers or chills.   PAST MEDICAL HISTORY :   has a past medical history of Diabetes mellitus; Hypertension; COPD (chronic obstructive pulmonary disease) ([place]); Chronic pain; Anxiety; Tracheobronchitis (01/01/2012); High cholesterol; OSA (obstructive sleep apnea); H/O: pneumonia; GERD (gastroesophageal reflux disease); Colon polyps; Diverticulosis; Gall stones; Diverticulitis; and Emphysema lung ([place]).  has past surgical history that includes Appendectomy; Cholecystectomy; Abdominal hysterectomy; Rotator cuff repair; Carpal tunnel release (Left); Fracture surgery; Bilateral shoulder surgery; Right knee surgery; Colonoscopy; and Esophagogastroduodenoscopy. Prior to Admission medications   Medication Sig Start Date End Date Taking? Authorizing Provider  albuterol (PROVENTIL HFA;VENTOLIN HFA) 108 (90 Base) MCG/ACT inhaler Inhale 1-2 puffs into the lungs every 4 (four) hours as needed.  For shortness of breath. 01/25/16  Yes Golden Circle, FNP  ALPRAZolam Duanne Moron) 0.5 MG tablet Take 1 tablet (0.5 mg total) by mouth at bedtime as needed for anxiety. 02/25/16  Yes Hoyt Koch, MD  ipratropium (ATROVENT) 0.02 % nebulizer solution Inhale 2.5 mLs into the lungs 2 (two) times daily. 02/25/16  Yes Historical Provider, MD  losartan-hydrochlorothiazide (HYZAAR) 100-12.5 MG tablet Take 1 tablet by mouth daily. 02/25/16  Yes Hoyt Koch, MD  metFORMIN (GLUCOPHAGE) 500 MG tablet Take 1 tablet (500 mg total) by mouth 2 (two) times daily with a meal. 01/25/16  Yes Golden Circle, FNP  pantoprazole (PROTONIX) 40 MG tablet Take 1 tablet (40 mg total) by mouth daily. 02/25/16  Yes Hoyt Koch, MD  tiotropium (SPIRIVA) 18 MCG inhalation capsule Place 1 capsule (18 mcg total) into inhaler and inhale daily. 12/18/15  Yes Kelvin Cellar, MD   Allergies  Allergen Reactions  . Aspartame And Phenylalanine Nausea And Vomiting    Patient says she is allergic to all artificial sweeteners  . Doxycycline Other (See Comments)    Nausea, vomiting, HA, double vision  . Tramadol Shortness Of Breath and Nausea Only  . Codeine Itching    Has not tried benadryl to alleviate side effects  . Neurontin [Gabapentin] Other (See Comments)    Dizzy, "drugged" feeling, sleepy  . Sulfa Antibiotics Other (See Comments)    Kidney problem   . Penicillins Rash    Has patient had a PCN reaction causing immediate rash, facial/tongue/throat swelling, SOB or lightheadedness with hypotension: No Has patient had a PCN reaction causing severe rash involving mucus membranes or skin necrosis: No Has patient had a PCN reaction that  required hospitalization No Has patient had a PCN reaction occurring within the last 10 years: No If all of the above answers are "NO", then may proceed with Cephalosporin use.  . Prednisone Rash    Patient stated she received Prednisone while she was in the hospital and  experienced a rash and "extreme pain.'    FAMILY HISTORY:  family history includes Alcohol abuse in her father; Diabetes in her maternal grandfather; Heart attack in her maternal grandfather, maternal grandmother, and mother; Hypertension in her maternal grandfather and maternal grandmother; Stroke in her maternal grandfather. There is no history of Thyroid disease, Lung disease, or Colon cancer. SOCIAL HISTORY:  reports that she has been smoking Cigarettes.  She started smoking about 42 years ago. She has a 20.5 pack-year smoking history. She has never used smokeless tobacco. She reports that she does not drink alcohol or use illicit drugs.  REVIEW OF SYSTEMS:   C/O cough, dyspnea, wheezing. No sputum production, fevers, chills Denies chest pain, palpitations No nausea, vomiting, diarrhea, constipation. All other ROS are negative  SUBJECTIVE: As above  VITAL SIGNS: Temp:  [97.9 F (36.6 C)-98.2 F (36.8 C)] 97.9 F (36.6 C) (06/14 0451) Pulse Rate:  [63-73] 63 (06/14 0451) Resp:  [18] 18 (06/14 0451) BP: (110-144)/(46-57) 110/46 mmHg (06/14 0451) SpO2:  [93 %-95 %] 94 % (06/14 0451) Weight:  [173 lb 4.5 oz (78.6 kg)] 173 lb 4.5 oz (78.6 kg) (06/14 0500)  PHYSICAL EXAMINATION: General:  No distress Neuro:  Awake, alert, oriented 4, No focal deficits HEENT:  Moist mucus membranes, No thyromegaly, JVD Cardiovascular:  Regular rate and rhythm, No MRG Lungs:  No wheeze, crackles Abdomen:  Bowel sounds positive, nontender nondistended Musculoskeletal:  Normal bulk and tone Skin:  No edema   Recent Labs Lab 03/12/16 0739 03/14/16 0504 03/15/16 0518  NA 141 136 140  K 3.9 4.7 3.6  CL 108 104 106  CO2 27 26 28   BUN 25* 32* 36*  CREATININE 1.16* 1.12* 1.22*  GLUCOSE 117* 159* 115*    Recent Labs Lab 03/10/16 0054 03/10/16 0103 03/11/16 0511 03/15/16 0518  HGB 14.1 13.9 12.2 12.4  HCT 40.7 41.0 37.0 37.6  WBC 11.0*  --  10.7* 10.9*  PLT 241  --  241 261   No  results found.  ASSESSMENT / PLAN: Acute on chronic respiratory r/t AECOPD +/- acute bronchitis > slow improvement. Active Tobacco abuse ? Adverse reaction in past to prednisone but tolerance of solumedrol  She is improving with no wheeze noted today. We will continue the ped taper and finish her abx course. She still has persistent residual cough and swallow study noted yesterday with mild aspiration.  PLAN -  Continue solumedrol. Taper to 40 gm IV qd Continue duonebs q6h, budesonide, PRN albuterol  Pulmonary hygiene  PO ceftin day 6/7 Start tussionex for cough Aspiration precautions   Marshell Garfinkel MD [facility] Pager [phone] If no answer or after 3pm call: [phone] 03/15/2016, 10:24 AM

## 2016-03-15 NOTE — Progress Notes (Signed)
PROGRESS NOTE    VERNELLA STUDENT  [id number] DOB: 08-03-1946 DOA: 03/10/2016 PCP: Hoyt Koch, MD    Brief Narrative: 70 year old female with history of COPD, tobacco use (continuous), HTN and DM. Patient presents with about one week history of progressively worsening COPD symptoms with associated cough and fever. Cough is said to be productive of yellowish sputum. Patient admitted for COPD exacerbation, pulmonary consulted due to lack of improvement, was started on solumedrol taper and now doing better overall. She is on room air, denies chest pain, no productive cough. Back pain today and cont neck soreness after she had a choking episode 2 nights ago.  Assessment & Plan:   Active Problems:   Hypoxia   COPD with exacerbation ([name])   COPD exacerbation ([name])  1-Acute COPD exacerbation.  Continue with pulmicort, has been tolerating this so far.  Continue with Ceftin, day 6/7.  Continue with albuterol  Tolerating Solumedrol 40 Mg IV, changed today to Daily. She seems to be improved overall. Encouraged to ambulate today and I anticipate d/c home tomorrow as she will complete steroid taper and PO antibiotics.  Acute on chronic respiratory failure, combined hypercarbia and hypoxemia; continue with treatment for number one.    DM; patient back on metformin per her request  Hypertension; Resume  ACE and diuretic. Monitor renal function  Hypokalemia; resolved.   Dysphagia; continue with protonix. Speech following.  DVT prophylaxis: lovenox Code Status: full code.  Family Communication: care discussed with patient.  Disposition Plan: Likely in next 24 hours.    Consultants:   Pulmonary   Procedures:  none  Antimicrobials:  ceftin 6-09   Subjective: Dyspnea improved. Had shocking episode last night while eating dinner.  Just spoke with speech therapist   Objective: Filed Vitals:   03/14/16 2036 03/14/16 2150 03/15/16 0451 03/15/16 0500  BP:  119/46  110/46   Pulse:  73 63   Temp:  98.2 F (36.8 C) 97.9 F (36.6 C)   TempSrc:  Oral Oral   Resp:  18 18   Height:      Weight:    78.6 kg (173 lb 4.5 oz)  SpO2: 95% 93% 94%     Intake/Output Summary (Last 24 hours) at 03/15/16 1151 Last data filed at 03/14/16 1219  Gross per 24 hour  Intake      0 ml  Output    300 ml  Net   -300 ml   Filed Weights   03/13/16 1901 03/14/16 0826 03/15/16 0500  Weight: 78.7 kg (173 lb 8 oz) 78.9 kg (173 lb 15.1 oz) 78.6 kg (173 lb 4.5 oz)    Examination:  General exam: Appears calm and comfortable  Respiratory system:  Normal respiratory effort, no significant wheezing.  Cardiovascular system: S1 & S2 heard, RRR. No JVD, murmurs, rubs, gallops or clicks. No pedal edema. Gastrointestinal system: Abdomen is nondistended, soft and nontender. No organomegaly or masses felt. Normal bowel sounds heard. Central nervous system: Alert and oriented. No focal neurological deficits. Extremities: Symmetric 5 x 5 power. Skin: No rashes, lesions or ulcers Psychiatry: Judgement and insight appear normal. Mood & affect appropriate.     Data Reviewed: I have personally reviewed following labs and imaging studies  CBC:  Recent Labs Lab 03/10/16 0054 03/10/16 0103 03/11/16 0511 03/15/16 0518  WBC 11.0*  --  10.7* 10.9*  NEUTROABS 7.5  --   --   --   HGB 14.1 13.9 12.2 12.4  HCT 40.7 41.0 37.0  37.6  MCV 84.6  --  88.3 86.0  PLT 241  --  241 0000000   Basic Metabolic Panel:  Recent Labs Lab 03/10/16 0103 03/11/16 0511 03/12/16 0739 03/14/16 0504 03/15/16 0518  NA 137 141 141 136 140  K 5.7* 3.3* 3.9 4.7 3.6  CL 99* 106 108 104 106  CO2  --  27 27 26 28   GLUCOSE 128* 109* 117* 159* 115*  BUN 25* 25* 25* 32* 36*  CREATININE 1.20* 1.26* 1.16* 1.12* 1.22*  CALCIUM  --  8.2* 8.4* 9.1 8.3*   GFR: Estimated Creatinine Clearance: 43.8 mL/min (by C-G formula based on Cr of 1.22). Liver Function Tests: No results for input(s): AST, ALT, ALKPHOS,  BILITOT, PROT, ALBUMIN in the last 168 hours. No results for input(s): LIPASE, AMYLASE in the last 168 hours. No results for input(s): AMMONIA in the last 168 hours. Coagulation Profile: No results for input(s): INR, PROTIME in the last 168 hours. Cardiac Enzymes: No results for input(s): CKTOTAL, CKMB, CKMBINDEX, TROPONINI in the last 168 hours. BNP (last 3 results) No results for input(s): PROBNP in the last 8760 hours. HbA1C: No results for input(s): HGBA1C in the last 72 hours. CBG:  Recent Labs Lab 03/14/16 0723 03/14/16 1627 03/14/16 2149 03/15/16 0712 03/15/16 1136  GLUCAP 146* 187* 210* 134* 207*   Lipid Profile: No results for input(s): CHOL, HDL, LDLCALC, TRIG, CHOLHDL, LDLDIRECT in the last 72 hours. Thyroid Function Tests: No results for input(s): TSH, T4TOTAL, FREET4, T3FREE, THYROIDAB in the last 72 hours. Anemia Panel: No results for input(s): VITAMINB12, FOLATE, FERRITIN, TIBC, IRON, RETICCTPCT in the last 72 hours. Sepsis Labs: No results for input(s): PROCALCITON, LATICACIDVEN in the last 168 hours.  No results found for this or any previous visit (from the past 240 hour(s)).       Radiology Studies: No results found.      Scheduled Meds: . budesonide (PULMICORT) nebulizer solution  0.5 mg Nebulization BID  . cefUROXime  500 mg Oral BID WC  . enoxaparin (LOVENOX) injection  40 mg Subcutaneous Daily  . losartan  100 mg Oral Daily   And  . hydrochlorothiazide  12.5 mg Oral Daily  . insulin aspart  0-9 Units Subcutaneous TID WC  . ipratropium-albuterol  3 mL Nebulization TID  . metFORMIN  500 mg Oral BID WC  . [START ON 03/16/2016] methylPREDNISolone (SOLU-MEDROL) injection  40 mg Intravenous Daily  . pantoprazole  40 mg Oral Daily   Continuous Infusions:     LOS: 5 days    Time spent: 35 minutes.     Julias Mould Marry Guan, MD [facility] Pager [phone]  If 7PM-7AM, please contact night-coverage [email or web address] Password  [facility] 03/15/2016, 11:51 AM

## 2016-03-15 NOTE — Progress Notes (Signed)
RN given report about pt. I agree with the daily assessment. Will continue to monitor pt. Closely

## 2016-03-15 NOTE — Progress Notes (Addendum)
Speech Language Pathology Treatment: Dysphagia  Patient Details Name: Victoria Bird MRN: [id number] DOB: [birth year] Today's Date: 03/15/2016 Time: 1545-1600 SLP Time Calculation (min) (ACUTE ONLY): 15 min  Assessment / Plan / Recommendation Clinical Impression  Pt reports she did not have a chance to review information SLP left with her yesterday as she was tired.  She denies issues with choking while consuming any po since occurrence the other night.  Daughter present and SLP educated her to findings of bse and recommendations.  Reviewed in detail SLP report from yesterday with pt and daughter.  Pt reports she is scheduled for colonoscopy soon (? July 6th or 8th), given recent choking episodes x2 in 4 weeks, advised her to phone GI office and make MD aware to determine if endoscopy desired.  Using teach back, all education completed with pt and her daughter.  Will sign off, please reorder if indicated. Of note, pt also indicates she typically eats one big meal each day instead of 3-5 small meals.     HPI HPI: pt is a 70 yo woman with h/o COPD adm to [facility] with fever/cough.  PMH + for COPD, diverticulitis, thyroid nodules, GERD, bronchitis. Pt had a choking episode on chicken last night which she reported caused her to lose her breath.  Pt also admits to prior issue choking on corn approximately 3 weeks ago.  She states choking occurs infrequently.  H/O EGD in 2013 in [place], uncertain of results.        SLP Plan  All goals met     Recommendations  Diet recommendations: Regular;Thin liquid Liquids provided via: Cup;Straw Medication Administration: Whole meds with liquid (large pills with peanut butter) Compensations: Slow rate;Small sips/bites (start meals with liquids) Postural Changes and/or Swallow Maneuvers: Seated upright 90 degrees;Upright 30-60 min after meal             Oral Care Recommendations: Oral care BID Follow up Recommendations: None Plan: All goals met      [place], [place], [place] [facility] [place] [phone]

## 2016-03-16 ENCOUNTER — Inpatient Hospital Stay (HOSPITAL_COMMUNITY): Payer: Medicare Other

## 2016-03-16 DIAGNOSIS — J410 Simple chronic bronchitis: Secondary | ICD-10-CM

## 2016-03-16 LAB — GLUCOSE, CAPILLARY
GLUCOSE-CAPILLARY: 128 mg/dL — AB (ref 65–99)
GLUCOSE-CAPILLARY: 200 mg/dL — AB (ref 65–99)
Glucose-Capillary: 161 mg/dL — ABNORMAL HIGH (ref 65–99)
Glucose-Capillary: 171 mg/dL — ABNORMAL HIGH (ref 65–99)

## 2016-03-16 MED ORDER — HYDROCOD POLST-CPM POLST ER 10-8 MG/5ML PO SUER
5.0000 mL | Freq: Two times a day (BID) | ORAL | Status: DC
  Filled 2016-03-16 (×3): qty 5

## 2016-03-16 MED ORDER — METHYLPREDNISOLONE SODIUM SUCC 40 MG IJ SOLR
40.0000 mg | Freq: Two times a day (BID) | INTRAMUSCULAR | Status: DC
  Administered 2016-03-16 – 2016-03-17 (×2): 40 mg via INTRAVENOUS
  Filled 2016-03-16 (×2): qty 1

## 2016-03-16 MED ORDER — MENTHOL 3 MG MT LOZG
1.0000 | LOZENGE | OROMUCOSAL | Status: DC | PRN
  Administered 2016-03-16: 3 mg via ORAL
  Filled 2016-03-16: qty 9

## 2016-03-16 MED ORDER — IPRATROPIUM-ALBUTEROL 0.5-2.5 (3) MG/3ML IN SOLN
3.0000 mL | Freq: Four times a day (QID) | RESPIRATORY_TRACT | Status: DC
  Administered 2016-03-16 – 2016-03-18 (×9): 3 mL via RESPIRATORY_TRACT
  Filled 2016-03-16 (×8): qty 3

## 2016-03-16 NOTE — Progress Notes (Signed)
PROGRESS NOTE    Victoria Bird  [id number] DOB: [birth year] DOA: 03/10/2016 PCP: Hoyt Koch, MD    Brief Narrative: 70 year old female with history of COPD, tobacco use (continuous), HTN and DM. Patient presents with about one week history of progressively worsening COPD symptoms with associated cough and fever. Cough is said to be productive of yellowish sputum. Patient admitted for COPD exacerbation, pulmonary consulted due to lack of improvement, was started on solumedrol taper and now doing better overall. She is on room air, denies chest pain, no productive cough. Back pain today and cont neck soreness after she had a choking episode 3 nights ago.  Assessment & Plan:   Active Problems:   Hypoxia   COPD with exacerbation ([name])   COPD exacerbation ([name])  1-Acute COPD exacerbation.  Continue with pulmicort, has been tolerating this so far.  Continue with Ceftin, day 7/7 today. Continue with albuterol  Tolerating Solumedrol 40 Mg IV, cont Daily per pulmonary She has a lot of complaints but looks very stable, on room air, able to speak full sentences without cough.  Acute on chronic respiratory failure, combined hypercarbia and hypoxemia; continue with treatment for number one.    DM; patient back on metformin per her request  Hypertension; Resume  ACE and diuretic. Monitor renal function  Hypokalemia; resolved.   Dysphagia; continue with protonix. Speech following.  DVT prophylaxis: lovenox Code Status: full code.  Family Communication: care discussed with patient.  Disposition Plan: Likely in next 24 hours.    Consultants:   Pulmonary   Procedures:  none  Antimicrobials:  ceftin 6-09   Subjective: Dyspnea improved. Still has back pain, neck soreness, hasn't walked in halls.    Objective: Filed Vitals:   03/15/16 2230 03/16/16 0515 03/16/16 0738 03/16/16 0754  BP: 142/62 138/59  152/60  Pulse: 62 59  65  Temp: 98.1 F (36.7 C) 98.1 F  (36.7 C)  97.7 F (36.5 C)  TempSrc: Oral Oral  Oral  Resp: 18 16  16   Height:      Weight:  77.1 kg (169 lb 15.6 oz)    SpO2: 95% 96% 97% 100%    Intake/Output Summary (Last 24 hours) at 03/16/16 1121 Last data filed at 03/16/16 0820  Gross per 24 hour  Intake    480 ml  Output      0 ml  Net    480 ml   Filed Weights   03/14/16 0826 03/15/16 0500 03/16/16 0515  Weight: 78.9 kg (173 lb 15.1 oz) 78.6 kg (173 lb 4.5 oz) 77.1 kg (169 lb 15.6 oz)    Examination:  General exam: Appears calm and comfortable  Respiratory system:  Normal respiratory effort, no significant wheezing. Very tight.  Cardiovascular system: S1 & S2 heard, RRR. No JVD, murmurs, rubs, gallops or clicks. No pedal edema. Gastrointestinal system: Abdomen is nondistended, soft and nontender. No organomegaly or masses felt. Normal bowel sounds heard. Central nervous system: Alert and oriented. No focal neurological deficits. Extremities: Symmetric 5 x 5 power. Skin: No rashes, lesions or ulcers Psychiatry: Judgement and insight appear normal. Mood & affect appropriate.     Data Reviewed: I have personally reviewed following labs and imaging studies  CBC:  Recent Labs Lab 03/10/16 0054 03/10/16 0103 03/11/16 0511 03/15/16 0518  WBC 11.0*  --  10.7* 10.9*  NEUTROABS 7.5  --   --   --   HGB 14.1 13.9 12.2 12.4  HCT 40.7 41.0 37.0 37.6  MCV 84.6  --  88.3 86.0  PLT 241  --  241 0000000   Basic Metabolic Panel:  Recent Labs Lab 03/10/16 0103 03/11/16 0511 03/12/16 0739 03/14/16 0504 03/15/16 0518  NA 137 141 141 136 140  K 5.7* 3.3* 3.9 4.7 3.6  CL 99* 106 108 104 106  CO2  --  27 27 26 28   GLUCOSE 128* 109* 117* 159* 115*  BUN 25* 25* 25* 32* 36*  CREATININE 1.20* 1.26* 1.16* 1.12* 1.22*  CALCIUM  --  8.2* 8.4* 9.1 8.3*   GFR: Estimated Creatinine Clearance: 43.4 mL/min (by C-G formula based on Cr of 1.22). Liver Function Tests: No results for input(s): AST, ALT, ALKPHOS, BILITOT, PROT,  ALBUMIN in the last 168 hours. No results for input(s): LIPASE, AMYLASE in the last 168 hours. No results for input(s): AMMONIA in the last 168 hours. Coagulation Profile: No results for input(s): INR, PROTIME in the last 168 hours. Cardiac Enzymes: No results for input(s): CKTOTAL, CKMB, CKMBINDEX, TROPONINI in the last 168 hours. BNP (last 3 results) No results for input(s): PROBNP in the last 8760 hours. HbA1C: No results for input(s): HGBA1C in the last 72 hours. CBG:  Recent Labs Lab 03/15/16 0712 03/15/16 1136 03/15/16 1653 03/15/16 2228 03/16/16 0759  GLUCAP 134* 207* 145* 166* 128*   Lipid Profile: No results for input(s): CHOL, HDL, LDLCALC, TRIG, CHOLHDL, LDLDIRECT in the last 72 hours. Thyroid Function Tests: No results for input(s): TSH, T4TOTAL, FREET4, T3FREE, THYROIDAB in the last 72 hours. Anemia Panel: No results for input(s): VITAMINB12, FOLATE, FERRITIN, TIBC, IRON, RETICCTPCT in the last 72 hours. Sepsis Labs: No results for input(s): PROCALCITON, LATICACIDVEN in the last 168 hours.  No results found for this or any previous visit (from the past 240 hour(s)).       Radiology Studies: Dg Chest 2 View  03/16/2016  CLINICAL DATA:  COPD shortness of breath cough smoker EXAM: CHEST  2 VIEW COMPARISON:  03/10/2016 FINDINGS: The heart size and vascular pattern are within normal limits. There is a band of opacity in the right perihilar area most consistent with discoid atelectasis, stable. Minimal left base atelectasis also stable. No consolidation or effusion. IMPRESSION: No change from prior study Electronically Signed   By: Skipper Cliche M.D.   On: 03/16/2016 10:54        Scheduled Meds: . budesonide (PULMICORT) nebulizer solution  0.5 mg Nebulization BID  . cefUROXime  500 mg Oral BID WC  . chlorpheniramine-HYDROcodone  5 mL Oral Q12H  . enoxaparin (LOVENOX) injection  40 mg Subcutaneous Daily  . losartan  100 mg Oral Daily   And  .  hydrochlorothiazide  12.5 mg Oral Daily  . insulin aspart  0-9 Units Subcutaneous TID WC  . ipratropium-albuterol  3 mL Nebulization Q6H  . metFORMIN  500 mg Oral BID WC  . methylPREDNISolone (SOLU-MEDROL) injection  40 mg Intravenous Q12H  . pantoprazole  40 mg Oral Daily   Continuous Infusions:     LOS: 6 days    Time spent: 31 minutes.     Morgana Rowley Marry Guan, MD [facility] Pager [phone]  If 7PM-7AM, please contact night-coverage [email or web address] Password [facility] 03/16/2016, 11:21 AM

## 2016-03-16 NOTE — Progress Notes (Signed)
Pt refusing Tussionex.

## 2016-03-16 NOTE — Progress Notes (Signed)
   Name: Victoria Bird MRN: [id number] DOB: 08-17-1946    ADMISSION DATE:  03/10/2016 CONSULTATION DATE:  6/11  REFERRING MD :  Tyrell Antonio ([facility])   CHIEF COMPLAINT:  AECOPD   BRIEF PATIENT DESCRIPTION:  70yo female with hx DM, GERD, severe COPD (followed by Dr. Ashok Cordia, FEV1 47%) and ongoing tobacco abuse.  Presented 6/9 with progressive dyspnea, cough and fever.  She was admitted by [facility] and treated for AECOPD and possible bronchitis with oral abx, BD's.  On 6/11 she was still not improving and PCCM consulted to assist.    SUBJECTIVE:  Pt reports ongoing sore throat after choking episode on 6/13 pm and shortness of breath - "feeling tight".    VITAL SIGNS: Temp:  [97.7 F (36.5 C)-98.1 F (36.7 C)] 97.7 F (36.5 C) (06/15 0754) Pulse Rate:  [59-65] 65 (06/15 0754) Resp:  [16-18] 16 (06/15 0754) BP: (138-152)/(59-62) 152/60 mmHg (06/15 0754) SpO2:  [94 %-100 %] 100 % (06/15 0754) Weight:  [169 lb 15.6 oz (77.1 kg)] 169 lb 15.6 oz (77.1 kg) (06/15 0515)  PHYSICAL EXAMINATION: General:  Chronically ill appearing adult female in no distress Neuro:  Awake, alert, oriented 4, No focal deficits HEENT:  Moist mucus membranes, No thyromegaly, JVD Cardiovascular:  Regular rate and rhythm, No MRG Lungs:  Diminished posterior, clear anterior, no wheezing Abdomen:  Bowel sounds positive, nontender nondistended Musculoskeletal:  Normal bulk and tone Skin:  No edema   Recent Labs Lab 03/12/16 0739 03/14/16 0504 03/15/16 0518  NA 141 136 140  K 3.9 4.7 3.6  CL 108 104 106  CO2 27 26 28   BUN 25* 32* 36*  CREATININE 1.16* 1.12* 1.22*  GLUCOSE 117* 159* 115*    Recent Labs Lab 03/10/16 0054 03/10/16 0103 03/11/16 0511 03/15/16 0518  HGB 14.1 13.9 12.2 12.4  HCT 40.7 41.0 37.0 37.6  WBC 11.0*  --  10.7* 10.9*  PLT 241  --  241 261   No results found.  SIGNIFICANT EVENTS  6/14  Feels better. Had an episode of choking on food yesterday.  STUDIES:   ASSESSMENT /  PLAN: Acute on chronic respiratory r/t AECOPD +/- acute bronchitis > slow improvement. Active Tobacco abuse ? Adverse reaction in past to prednisone but tolerance of solumedrol  She is improving with no wheeze noted today. We will continue the ped taper and finish her abx course. She still has persistent residual cough and swallow study noted yesterday with mild aspiration.  PLAN -  Continue solumedrol. Keep at 40 mg q12.  No decrease 6/15 Adjust duonebs to q6h Continue budesonide, PRN albuterol  Pulmonary hygiene  PO ceftin day 7/7 Start tussionex for cough Add cepacol for sore throat Assess CXR after choking episode to ensure no developing infiltrate Aspiration precautions    Noe Gens, NP-C [facility] Pgr: [phone] or if no answer [phone] 03/16/2016, 9:35 AM   Attending note: I have seen and examined the patient with nurse practitioner/resident and agree with the note. History, labs and imaging reviewed.  70 Y/O with severe COPD admitted with acute exacerbation, bronchitis. Still has issues with persistent cough and ongoing aspiration.  Follow repeat CXR today Continue steroids, nebs Aspiration precautions Cough suppression with tussionex, cepacol  Marshell Garfinkel MD [facility] Pager [phone] If no answer or after 3pm call: [phone] 03/16/2016, 10:46 AM

## 2016-03-17 LAB — GLUCOSE, CAPILLARY
GLUCOSE-CAPILLARY: 222 mg/dL — AB (ref 65–99)
GLUCOSE-CAPILLARY: 250 mg/dL — AB (ref 65–99)
GLUCOSE-CAPILLARY: 191 mg/dL — AB (ref 65–99)
Glucose-Capillary: 167 mg/dL — ABNORMAL HIGH (ref 65–99)

## 2016-03-17 LAB — CREATININE, SERUM
Creatinine, Ser: 1.05 mg/dL — ABNORMAL HIGH (ref 0.44–1.00)
GFR calc Af Amer: >60 mL/min (ref >60)
GFR calc non Af Amer: 53 mL/min — ABNORMAL LOW (ref >60)

## 2016-03-17 MED ORDER — METHYLPREDNISOLONE SODIUM SUCC 40 MG IJ SOLR
40.0000 mg | Freq: Every day | INTRAMUSCULAR | Status: DC
  Administered 2016-03-18 – 2016-03-20 (×3): 40 mg via INTRAVENOUS
  Filled 2016-03-17 (×3): qty 1

## 2016-03-17 NOTE — Progress Notes (Signed)
   Name: SARAIYA BEAGAN MRN: [id number] DOB: Aug 27, 1946    ADMISSION DATE:  03/10/2016 CONSULTATION DATE:  6/11  REFERRING MD :  Tyrell Antonio ([facility])   CHIEF COMPLAINT:  AECOPD   BRIEF PATIENT DESCRIPTION:  70yo female with hx DM, GERD, severe COPD (followed by Dr. Ashok Cordia, FEV1 47%) and ongoing tobacco abuse.  Presented 6/9 with progressive dyspnea, cough and fever.  She was admitted by [facility] and treated for AECOPD and possible bronchitis with oral abx, BD's.  On 6/11 she was still not improving and PCCM consulted to assist.    SUBJECTIVE:  Unable to sleep yesterday. Otherwise no complaints.   VITAL SIGNS: Temp:  [97.7 F (36.5 C)-98.3 F (36.8 C)] 98.3 F (36.8 C) (06/16 0438) Pulse Rate:  [61-79] 61 (06/16 0438) Resp:  [18-20] 18 (06/16 0438) BP: (134-142)/(58-71) 142/65 mmHg (06/16 0438) SpO2:  [92 %-96 %] 96 % (06/16 0751) Weight:  [174 lb 9.6 oz (79.198 kg)] 174 lb 9.6 oz (79.198 kg) (06/16 0438)  PHYSICAL EXAMINATION: General:  No distress Neuro:  Awake, alert, oriented 4, No focal deficits HEENT:  Moist mucus membranes, No thyromegaly, JVD Cardiovascular:  Regular rate and rhythm, No MRG Lungs:  No wheeze, crackles Abdomen:  Bowel sounds positive, nontender nondistended Musculoskeletal:  Normal bulk and tone Skin:  No edema   Recent Labs Lab 03/12/16 0739 03/14/16 0504 03/15/16 0518 03/17/16 0448  NA 141 136 140  --   K 3.9 4.7 3.6  --   CL 108 104 106  --   CO2 27 26 28   --   BUN 25* 32* 36*  --   CREATININE 1.16* 1.12* 1.22* 1.05*  GLUCOSE 117* 159* 115*  --     Recent Labs Lab 03/11/16 0511 03/15/16 0518  HGB 12.2 12.4  HCT 37.0 37.6  WBC 10.7* 10.9*  PLT 241 261   Dg Chest 2 View  03/16/2016  CLINICAL DATA:  COPD shortness of breath cough smoker EXAM: CHEST  2 VIEW COMPARISON:  03/10/2016 FINDINGS: The heart size and vascular pattern are within normal limits. There is a band of opacity in the right perihilar area most consistent with discoid  atelectasis, stable. Minimal left base atelectasis also stable. No consolidation or effusion. IMPRESSION: No change from prior study Electronically Signed   By: Skipper Cliche M.D.   On: 03/16/2016 10:54    SIGNIFICANT EVENTS   STUDIES:   ASSESSMENT / PLAN: Acute on chronic respiratory r/t AECOPD +/- acute bronchitis > slow improvement. Active Tobacco abuse Choking episodes ? Adverse reaction in past to prednisone but tolerance of solumedrol  She is slowly improving with no wheeze noted today. We will continue the ped taper. She still has persistent residual cough and swallow study noted with mild aspiration.   PLAN -  Continue solumedrol. Reduce to 40 mg qd today Contine duonebs to q6h Continue budesonide, PRN albuterol  Pulmonary hygiene  Finished cefetin Cepacol and tussionex for cough Aspiration precautions. She will need GI follow up (sees Dr. Henrene Pastor)   [place] discharge soon. Dr. Ashok Cordia to check on the patient tomorrow.  Marshell Garfinkel MD [facility] Pager [phone] If no answer or after 3pm call: [phone] 03/17/2016, 11:37 AM

## 2016-03-17 NOTE — Progress Notes (Signed)
PROGRESS NOTE    BARBA REKOWSKI  [id number] DOB: 1946/06/07 DOA: 03/10/2016 PCP: Hoyt Koch, MD    Brief Narrative: 70 year old female with history of COPD, tobacco use (continuous), HTN and DM. Patient presents with about one week history of progressively worsening COPD symptoms with associated cough and fever. Cough is said to be productive of yellowish sputum. Patient admitted for COPD exacerbation, pulmonary consulted due to lack of improvement, was started on solumedrol taper and now doing better overall. She is on room air, denies chest pain, no productive cough. Back pain today but neck soreness is better.  Assessment & Plan:   Active Problems:   Hypoxia   COPD with exacerbation ([name])   COPD exacerbation ([name])  1-Acute COPD exacerbation.  Continue with pulmicort, has been tolerating this so far.  Completed a week of Ceftin. Continue with albuterol  Tolerating Solumedrol 40 Mg IV, was increased to BID by Pulm  She has a lot of complaints but looks very stable, on room air, able to speak full sentences without cough.  Acute on chronic respiratory failure, combined hypercarbia and hypoxemia; continue with treatment for number one.    DM; patient back on metformin per her request  Hypertension; Resume  ACE and diuretic. Monitor renal function  Hypokalemia; resolved.   Dysphagia; continue with protonix. Speech following.  DVT prophylaxis: lovenox Code Status: full code.  Family Communication: care discussed with patient.  Disposition Plan: Likely in next 24 hours.    Consultants:   Pulmonary   Procedures:  none  Antimicrobials:  ceftin 6-09   Subjective: Dyspnea improved. Still has back pain, neck soreness, hasn't walked in halls.    Objective: Filed Vitals:   03/16/16 1942 03/16/16 2008 03/17/16 0438 03/17/16 0751  BP:  142/58 142/65   Pulse:  79 61   Temp:  97.7 F (36.5 C) 98.3 F (36.8 C)   TempSrc:  Oral Oral   Resp:  18 18     Height:      Weight:   79.198 kg (174 lb 9.6 oz)   SpO2: 96% 93% 92% 96%    Intake/Output Summary (Last 24 hours) at 03/17/16 0952 Last data filed at 03/16/16 1800  Gross per 24 hour  Intake    480 ml  Output      0 ml  Net    480 ml   Filed Weights   03/15/16 0500 03/16/16 0515 03/17/16 0438  Weight: 78.6 kg (173 lb 4.5 oz) 77.1 kg (169 lb 15.6 oz) 79.198 kg (174 lb 9.6 oz)    Examination:  General exam: Appears calm and comfortable  Respiratory system:  Normal respiratory effort, no significant wheezing. Very tight with improved air movement today.  Cardiovascular system: S1 & S2 heard, RRR. No JVD, murmurs, rubs, gallops or clicks. No pedal edema. Gastrointestinal system: Abdomen is nondistended, soft and nontender. No organomegaly or masses felt. Normal bowel sounds heard. Central nervous system: Alert and oriented. No focal neurological deficits. Extremities: Symmetric 5 x 5 power. Skin: No rashes, lesions or ulcers Psychiatry: Judgement and insight appear normal. Mood & affect appropriate.     Data Reviewed: I have personally reviewed following labs and imaging studies  CBC:  Recent Labs Lab 03/11/16 0511 03/15/16 0518  WBC 10.7* 10.9*  HGB 12.2 12.4  HCT 37.0 37.6  MCV 88.3 86.0  PLT 241 0000000   Basic Metabolic Panel:  Recent Labs Lab 03/11/16 0511 03/12/16 0739 03/14/16 0504 03/15/16 0518 03/17/16 0448  NA 141 141 136 140  --   K 3.3* 3.9 4.7 3.6  --   CL 106 108 104 106  --   CO2 27 27 26 28   --   GLUCOSE 109* 117* 159* 115*  --   BUN 25* 25* 32* 36*  --   CREATININE 1.26* 1.16* 1.12* 1.22* 1.05*  CALCIUM 8.2* 8.4* 9.1 8.3*  --    GFR: Estimated Creatinine Clearance: 51.1 mL/min (by C-G formula based on Cr of 1.05). Liver Function Tests: No results for input(s): AST, ALT, ALKPHOS, BILITOT, PROT, ALBUMIN in the last 168 hours. No results for input(s): LIPASE, AMYLASE in the last 168 hours. No results for input(s): AMMONIA in the last 168  hours. Coagulation Profile: No results for input(s): INR, PROTIME in the last 168 hours. Cardiac Enzymes: No results for input(s): CKTOTAL, CKMB, CKMBINDEX, TROPONINI in the last 168 hours. BNP (last 3 results) No results for input(s): PROBNP in the last 8760 hours. HbA1C: No results for input(s): HGBA1C in the last 72 hours. CBG:  Recent Labs Lab 03/16/16 0759 03/16/16 1216 03/16/16 1707 03/16/16 2201 03/17/16 0730  GLUCAP 128* 161* 171* 200* 167*   Lipid Profile: No results for input(s): CHOL, HDL, LDLCALC, TRIG, CHOLHDL, LDLDIRECT in the last 72 hours. Thyroid Function Tests: No results for input(s): TSH, T4TOTAL, FREET4, T3FREE, THYROIDAB in the last 72 hours. Anemia Panel: No results for input(s): VITAMINB12, FOLATE, FERRITIN, TIBC, IRON, RETICCTPCT in the last 72 hours. Sepsis Labs: No results for input(s): PROCALCITON, LATICACIDVEN in the last 168 hours.  No results found for this or any previous visit (from the past 240 hour(s)).       Radiology Studies: Dg Chest 2 View  03/16/2016  CLINICAL DATA:  COPD shortness of breath cough smoker EXAM: CHEST  2 VIEW COMPARISON:  03/10/2016 FINDINGS: The heart size and vascular pattern are within normal limits. There is a band of opacity in the right perihilar area most consistent with discoid atelectasis, stable. Minimal left base atelectasis also stable. No consolidation or effusion. IMPRESSION: No change from prior study Electronically Signed   By: Skipper Cliche M.D.   On: 03/16/2016 10:54        Scheduled Meds: . budesonide (PULMICORT) nebulizer solution  0.5 mg Nebulization BID  . cefUROXime  500 mg Oral BID WC  . chlorpheniramine-HYDROcodone  5 mL Oral Q12H  . enoxaparin (LOVENOX) injection  40 mg Subcutaneous Daily  . losartan  100 mg Oral Daily   And  . hydrochlorothiazide  12.5 mg Oral Daily  . insulin aspart  0-9 Units Subcutaneous TID WC  . ipratropium-albuterol  3 mL Nebulization Q6H  . metFORMIN  500  mg Oral BID WC  . methylPREDNISolone (SOLU-MEDROL) injection  40 mg Intravenous Q12H  . pantoprazole  40 mg Oral Daily   Continuous Infusions:     LOS: 7 days    Time spent: 26 minutes.     Mir Marry Guan, MD [facility] Pager [phone]  If 7PM-7AM, please contact night-coverage [email or web address] Password [facility] 03/17/2016, 9:52 AM

## 2016-03-18 LAB — GLUCOSE, CAPILLARY
Glucose-Capillary: 122 mg/dL — ABNORMAL HIGH (ref 65–99)
Glucose-Capillary: 150 mg/dL — ABNORMAL HIGH (ref 65–99)
Glucose-Capillary: 253 mg/dL — ABNORMAL HIGH (ref 65–99)
Glucose-Capillary: 256 mg/dL — ABNORMAL HIGH (ref 65–99)

## 2016-03-18 NOTE — Progress Notes (Signed)
   Name: Victoria Bird MRN: [id number] DOB: 08-16-1946    ADMISSION DATE:  03/10/2016 CONSULTATION DATE:  6/11  REFERRING MD :  Tyrell Antonio ([facility])   CHIEF COMPLAINT:  AECOPD   BRIEF PATIENT DESCRIPTION:  70yo female with hx DM, GERD, severe COPD (followed by Dr. Ashok Cordia, FEV1 47%) and ongoing tobacco abuse.  Presented 6/9 with progressive dyspnea, cough and fever.  She was admitted by [facility] and treated for AECOPD and possible bronchitis with oral abx, BD's.  On 6/11 she was still not improving and PCCM consulted to assist.    SUBJECTIVE:  Patient reports she is having nonproductive cough. Also endorses chest tightness as well as whole body pain. Mild hot and cold chills but no subjective fever or sweats. Dyspnea she feels his relatively unchanged since yesterday.  REVIEW OF SYSTEMS: No nausea, vomiting, or abdominal pain. No vision changes or acute focal weakness.  VITAL SIGNS: Temp:  [97.6 F (36.4 C)-98.4 F (36.9 C)] 97.6 F (36.4 C) (06/17 0517) Pulse Rate:  [60-67] 60 (06/17 0517) Resp:  [16] 16 (06/17 0517) BP: (99-169)/(53-68) 99/63 mmHg (06/17 0517) SpO2:  [92 %-95 %] 94 % (06/17 0732) Weight:  [172 lb 9.9 oz (78.3 kg)] 172 lb 9.9 oz (78.3 kg) (06/17 0517)  PHYSICAL EXAMINATION: General:  Awake. Alert. No acute distress. Sitting watching TV. No family at bedside.  Integument:  Warm & dry. No rash on exposed skin. No bruising. HEENT: Dry mucus membranes. No oral ulcers. No scleral injection or icterus. PERRL. Cardiovascular:  Regular rate. No edema. No appreciable JVD.  Pulmonary:  Prolonged exhalation phase. Symmetric chest wall expansion. No accessory muscle use. Abdomen: Soft. Normal bowel sounds. Nondistended. Grossly nontender. Musculoskeletal:  Normal bulk and tone. No joint deformity or effusion appreciated.   Recent Labs Lab 03/12/16 0739 03/14/16 0504 03/15/16 0518 03/17/16 0448  NA 141 136 140  --   K 3.9 4.7 3.6  --   CL 108 104 106  --   CO2 27 26 28    --   BUN 25* 32* 36*  --   CREATININE 1.16* 1.12* 1.22* 1.05*  GLUCOSE 117* 159* 115*  --     Recent Labs Lab 03/15/16 0518  HGB 12.4  HCT 37.6  WBC 10.9*  PLT 261   No results found.  SIGNIFICANT EVENTS   STUDIES:   ASSESSMENT / PLAN:  70 year old female with acute COPD exacerbation resulting in acute on chronic hypoxic respiratory failure. Patient has no chronic tobacco use. Now has evidence of aspiration on swallowing evaluation. Seems to be recovering slowly.  1. Severe COPD with acute exacerbation: Continuing Solu-Medrol daily, Pulmicort twice a day, & DuoNeb every 6 hours. Patient previously intolerant of prednisone. Holding on further steroid weaning at this time. 2. Acute on chronic hypoxic respiratory failure: Clinically improved. 3. Aspiration: Aspiration precautions. Needs follow-up with GI. 4. Ongoing tobacco use: Patient previously unable to quit. Multiple psychosocial stressors preventing cessation. 5. Follow-up: Plan for follow-up in my pulmonary clinic within 2-4 weeks of discharge from hospital.  [place]. Ashok Cordia, M.D. [facility] Pager:  [phone] After 3pm or if no response, call [phone]  03/18/2016, 11:08 AM

## 2016-03-18 NOTE — Progress Notes (Signed)
PROGRESS NOTE    KYRSTIN BLANKS  [id number] DOB: [birth year] DOA: 03/10/2016 PCP: Hoyt Koch, MD    Brief Narrative: 70 year old female with history of COPD, tobacco use (continuous), HTN and DM. Patient presents with about one week history of progressively worsening COPD symptoms with associated cough and fever. Cough is said to be productive of yellowish sputum. Patient admitted for COPD exacerbation, pulmonary consulted due to lack of improvement, was started on solumedrol taper and now doing better overall. She is on room air, denies chest pain, no productive cough.   Assessment & Plan:   Active Problems:   Hypoxia   COPD with exacerbation ([name])   COPD exacerbation ([name])  1-Acute COPD exacerbation.  Continue with pulmicort, has been tolerating this so far.  Completed a week of Ceftin. Continue with albuterol  Tolerating Solumedrol 40 Mg IV, was increased to BID by Pulm, now daily.  She has a lot of complaints but looks very stable, on room air, able to speak full sentences without cough. Spoke with Dr. Ashok Cordia who knows her well. Recommends another day or two of Solumedrol IV in house before d/c since she doesn't tolerate PO steroids.  Acute on chronic respiratory failure, combined hypercarbia and hypoxemia; continue with treatment for number one.    DM; patient back on metformin per her request  Hypertension; Resume  ACE and diuretic. Monitor renal function  Hypokalemia; resolved.   Dysphagia; continue with protonix. Speech following.  DVT prophylaxis: lovenox Code Status: full code.  Family Communication: care discussed with patient.  Disposition Plan: Likely in next 24 hours.    Consultants:   Pulmonary   Procedures:  none  Antimicrobials:  ceftin 6-09   Subjective: Dyspnea improved. Still has back pain, neck soreness, hasn't walked in halls.    Objective: Filed Vitals:   03/17/16 2128 03/18/16 0132 03/18/16 0517 03/18/16 0732  BP:  169/68  99/63   Pulse: 67  60   Temp: 98.3 F (36.8 C)  97.6 F (36.4 C)   TempSrc: Oral  Oral   Resp: 16  16   Height:      Weight:   78.3 kg (172 lb 9.9 oz)   SpO2: 92% 92% 94% 94%    Intake/Output Summary (Last 24 hours) at 03/18/16 1208 Last data filed at 03/18/16 0831  Gross per 24 hour  Intake    480 ml  Output      0 ml  Net    480 ml   Filed Weights   03/16/16 0515 03/17/16 0438 03/18/16 0517  Weight: 77.1 kg (169 lb 15.6 oz) 79.198 kg (174 lb 9.6 oz) 78.3 kg (172 lb 9.9 oz)    Examination:  General exam: Appears calm and comfortable  Respiratory system:  Normal respiratory effort, no significant wheezing. Very tight with improved air movement today.  Cardiovascular system: S1 & S2 heard, RRR. No JVD, murmurs, rubs, gallops or clicks. No pedal edema. Gastrointestinal system: Abdomen is nondistended, soft and nontender. No organomegaly or masses felt. Normal bowel sounds heard. Central nervous system: Alert and oriented. No focal neurological deficits. Extremities: Symmetric 5 x 5 power. Skin: No rashes, lesions or ulcers Psychiatry: Judgement and insight appear normal. Mood & affect appropriate.     Data Reviewed: I have personally reviewed following labs and imaging studies  CBC:  Recent Labs Lab 03/15/16 0518  WBC 10.9*  HGB 12.4  HCT 37.6  MCV 86.0  PLT 0000000   Basic Metabolic Panel:  Recent  Labs Lab 03/12/16 0739 03/14/16 0504 03/15/16 0518 03/17/16 0448  NA 141 136 140  --   K 3.9 4.7 3.6  --   CL 108 104 106  --   CO2 27 26 28   --   GLUCOSE 117* 159* 115*  --   BUN 25* 32* 36*  --   CREATININE 1.16* 1.12* 1.22* 1.05*  CALCIUM 8.4* 9.1 8.3*  --    GFR: Estimated Creatinine Clearance: 50.8 mL/min (by C-G formula based on Cr of 1.05). Liver Function Tests: No results for input(s): AST, ALT, ALKPHOS, BILITOT, PROT, ALBUMIN in the last 168 hours. No results for input(s): LIPASE, AMYLASE in the last 168 hours. No results for input(s):  AMMONIA in the last 168 hours. Coagulation Profile: No results for input(s): INR, PROTIME in the last 168 hours. Cardiac Enzymes: No results for input(s): CKTOTAL, CKMB, CKMBINDEX, TROPONINI in the last 168 hours. BNP (last 3 results) No results for input(s): PROBNP in the last 8760 hours. HbA1C: No results for input(s): HGBA1C in the last 72 hours. CBG:  Recent Labs Lab 03/17/16 1149 03/17/16 1637 03/17/16 2132 03/18/16 0732 03/18/16 1145  GLUCAP 222* 250* 191* 122* 150*   Lipid Profile: No results for input(s): CHOL, HDL, LDLCALC, TRIG, CHOLHDL, LDLDIRECT in the last 72 hours. Thyroid Function Tests: No results for input(s): TSH, T4TOTAL, FREET4, T3FREE, THYROIDAB in the last 72 hours. Anemia Panel: No results for input(s): VITAMINB12, FOLATE, FERRITIN, TIBC, IRON, RETICCTPCT in the last 72 hours. Sepsis Labs: No results for input(s): PROCALCITON, LATICACIDVEN in the last 168 hours.  No results found for this or any previous visit (from the past 240 hour(s)).       Radiology Studies: No results found.      Scheduled Meds: . budesonide (PULMICORT) nebulizer solution  0.5 mg Nebulization BID  . cefUROXime  500 mg Oral BID WC  . chlorpheniramine-HYDROcodone  5 mL Oral Q12H  . enoxaparin (LOVENOX) injection  40 mg Subcutaneous Daily  . losartan  100 mg Oral Daily   And  . hydrochlorothiazide  12.5 mg Oral Daily  . insulin aspart  0-9 Units Subcutaneous TID WC  . ipratropium-albuterol  3 mL Nebulization Q6H  . metFORMIN  500 mg Oral BID WC  . methylPREDNISolone (SOLU-MEDROL) injection  40 mg Intravenous Daily  . pantoprazole  40 mg Oral Daily   Continuous Infusions:     LOS: 8 days    Time spent: 21 minutes.     Mir Marry Guan, MD [facility] Pager [phone]  If 7PM-7AM, please contact night-coverage [email or web address] Password [name] 03/18/2016, 12:08 PM

## 2016-03-19 LAB — GLUCOSE, CAPILLARY
GLUCOSE-CAPILLARY: 161 mg/dL — AB (ref 65–99)
GLUCOSE-CAPILLARY: 200 mg/dL — AB (ref 65–99)
GLUCOSE-CAPILLARY: 179 mg/dL — AB (ref 65–99)
Glucose-Capillary: 117 mg/dL — ABNORMAL HIGH (ref 65–99)

## 2016-03-19 MED ORDER — IPRATROPIUM-ALBUTEROL 0.5-2.5 (3) MG/3ML IN SOLN
3.0000 mL | Freq: Four times a day (QID) | RESPIRATORY_TRACT | Status: DC
  Administered 2016-03-19 – 2016-03-20 (×4): 3 mL via RESPIRATORY_TRACT
  Filled 2016-03-19 (×4): qty 3

## 2016-03-19 NOTE — Progress Notes (Signed)
PROGRESS NOTE    Victoria Bird  [id number] DOB: Nov 08, 1945 DOA: 03/10/2016 PCP: Hoyt Koch, MD    Brief Narrative: 70 year old female with history of COPD, tobacco use (continuous), HTN and DM. Patient presents with about one week history of progressively worsening COPD symptoms with associated cough and fever. Cough is said to be productive of yellowish sputum. Patient admitted for COPD exacerbation, pulmonary consulted due to lack of improvement, was started on solumedrol taper and now doing better overall. She is on room air, denies chest pain, no productive cough. She has had prolonged stay due to IV steroid taper as she is intolerant of PO prednisone.  Assessment & Plan:   Active Problems:   Hypoxia   COPD with exacerbation ([name])   COPD exacerbation ([name])  1-Acute COPD exacerbation.  Continue with pulmicort, has been tolerating this so far.  Completed a week of Ceftin. Continue with albuterol  Tolerating Solumedrol 40 Mg IV, was increased to BID by Pulm, now daily.  She has a lot of complaints but looks very stable, on room air, able to speak full sentences without cough. Spoke with Dr. Ashok Cordia who knows her well on 6/17. Recommended another day or two of Solumedrol IV in house before d/c since she doesn't tolerate PO steroids.  Acute on chronic respiratory failure, combined hypercarbia and hypoxemia; continue with treatment for number one.    DM; patient back on metformin per her request  Hypertension; Resume  ACE and diuretic. Monitor renal function  Hypokalemia; resolved.   Dysphagia; continue with protonix. Speech following.  DVT prophylaxis: lovenox Code Status: full code.  Family Communication: care discussed with patient.  Disposition Plan: Likely in next 24 hours.    Consultants:   Pulmonary   Procedures:  none  Antimicrobials:  ceftin 6-09   Subjective: Dyspnea improved. Back pain, neck soreness, little better. Didn't sleep well  last night.   Objective: Filed Vitals:   03/19/16 0855 03/19/16 0900 03/19/16 1330 03/19/16 1403  BP:   120/54   Pulse:   68   Temp:   97.9 F (36.6 C)   TempSrc:   Oral   Resp:   20   Height:      Weight:      SpO2: 95% 95% 92% 91%    Intake/Output Summary (Last 24 hours) at 03/19/16 1638 Last data filed at 03/19/16 0830  Gross per 24 hour  Intake    720 ml  Output      0 ml  Net    720 ml   Filed Weights   03/17/16 0438 03/18/16 0517 03/19/16 0634  Weight: 79.198 kg (174 lb 9.6 oz) 78.3 kg (172 lb 9.9 oz) 78.6 kg (173 lb 4.5 oz)    Examination:  General exam: Appears calm and comfortable  Respiratory system:  Normal respiratory effort, no significant wheezing. Very tight with improved air movement today.  Cardiovascular system: S1 & S2 heard, RRR. No JVD, murmurs, rubs, gallops or clicks. No pedal edema. Gastrointestinal system: Abdomen is nondistended, soft and nontender. No organomegaly or masses felt. Normal bowel sounds heard. Central nervous system: Alert and oriented. No focal neurological deficits. Extremities: Symmetric 5 x 5 power. Skin: No rashes, lesions or ulcers Psychiatry: Judgement and insight appear normal. Mood & affect appropriate.     Data Reviewed: I have personally reviewed following labs and imaging studies  CBC:  Recent Labs Lab 03/15/16 0518  WBC 10.9*  HGB 12.4  HCT 37.6  MCV 86.0  PLT 0000000   Basic Metabolic Panel:  Recent Labs Lab 03/14/16 0504 03/15/16 0518 03/17/16 0448  NA 136 140  --   K 4.7 3.6  --   CL 104 106  --   CO2 26 28  --   GLUCOSE 159* 115*  --   BUN 32* 36*  --   CREATININE 1.12* 1.22* 1.05*  CALCIUM 9.1 8.3*  --    GFR: Estimated Creatinine Clearance: 50.2 mL/min (by C-G formula based on Cr of 1.05). Liver Function Tests: No results for input(s): AST, ALT, ALKPHOS, BILITOT, PROT, ALBUMIN in the last 168 hours. No results for input(s): LIPASE, AMYLASE in the last 168 hours. No results for input(s):  AMMONIA in the last 168 hours. Coagulation Profile: No results for input(s): INR, PROTIME in the last 168 hours. Cardiac Enzymes: No results for input(s): CKTOTAL, CKMB, CKMBINDEX, TROPONINI in the last 168 hours. BNP (last 3 results) No results for input(s): PROBNP in the last 8760 hours. HbA1C: No results for input(s): HGBA1C in the last 72 hours. CBG:  Recent Labs Lab 03/18/16 1145 03/18/16 1707 03/18/16 2154 03/19/16 0715 03/19/16 1158  GLUCAP 150* 253* 256* 117* 161*   Lipid Profile: No results for input(s): CHOL, HDL, LDLCALC, TRIG, CHOLHDL, LDLDIRECT in the last 72 hours. Thyroid Function Tests: No results for input(s): TSH, T4TOTAL, FREET4, T3FREE, THYROIDAB in the last 72 hours. Anemia Panel: No results for input(s): VITAMINB12, FOLATE, FERRITIN, TIBC, IRON, RETICCTPCT in the last 72 hours. Sepsis Labs: No results for input(s): PROCALCITON, LATICACIDVEN in the last 168 hours.  No results found for this or any previous visit (from the past 240 hour(s)).       Radiology Studies: No results found.      Scheduled Meds: . budesonide (PULMICORT) nebulizer solution  0.5 mg Nebulization BID  . cefUROXime  500 mg Oral BID WC  . chlorpheniramine-HYDROcodone  5 mL Oral Q12H  . enoxaparin (LOVENOX) injection  40 mg Subcutaneous Daily  . losartan  100 mg Oral Daily   And  . hydrochlorothiazide  12.5 mg Oral Daily  . insulin aspart  0-9 Units Subcutaneous TID WC  . ipratropium-albuterol  3 mL Nebulization Q6H WA  . metFORMIN  500 mg Oral BID WC  . methylPREDNISolone (SOLU-MEDROL) injection  40 mg Intravenous Daily  . pantoprazole  40 mg Oral Daily   Continuous Infusions:     LOS: 9 days    Time spent: 21 minutes.     Kenny Rea Marry Guan, MD [facility] Pager [phone]  If 7PM-7AM, please contact night-coverage [email or web address] Password [facility] 03/19/2016, 4:38 PM

## 2016-03-20 ENCOUNTER — Other Ambulatory Visit: Payer: Self-pay

## 2016-03-20 DIAGNOSIS — E119 Type 2 diabetes mellitus without complications: Secondary | ICD-10-CM

## 2016-03-20 LAB — GLUCOSE, CAPILLARY
GLUCOSE-CAPILLARY: 114 mg/dL — AB (ref 65–99)
Glucose-Capillary: 220 mg/dL — ABNORMAL HIGH (ref 65–99)
Glucose-Capillary: 229 mg/dL — ABNORMAL HIGH (ref 65–99)

## 2016-03-20 MED ORDER — TIOTROPIUM BROMIDE MONOHYDRATE 18 MCG IN CAPS
18.0000 ug | ORAL_CAPSULE | Freq: Every day | RESPIRATORY_TRACT | Status: DC
  Administered 2016-03-20: 18 ug via RESPIRATORY_TRACT
  Filled 2016-03-20: qty 5

## 2016-03-20 MED ORDER — BUDESONIDE-FORMOTEROL FUMARATE 160-4.5 MCG/ACT IN AERO
2.0000 | INHALATION_SPRAY | Freq: Two times a day (BID) | RESPIRATORY_TRACT | Status: DC
  Filled 2016-03-20: qty 6

## 2016-03-20 MED ORDER — MOMETASONE FURO-FORMOTEROL FUM 200-5 MCG/ACT IN AERO
2.0000 | INHALATION_SPRAY | Freq: Two times a day (BID) | RESPIRATORY_TRACT | Status: DC
  Filled 2016-03-20: qty 8.8

## 2016-03-20 NOTE — Progress Notes (Signed)
   Name: Victoria Bird MRN: [id number] DOB: 29-Oct-1945    ADMISSION DATE:  03/10/2016 CONSULTATION DATE:  6/11  REFERRING MD :  Tyrell Antonio ([facility])   CHIEF COMPLAINT:  AECOPD   BRIEF PATIENT DESCRIPTION:  70yo female with hx DM, GERD, severe COPD (followed by Dr. Ashok Cordia, FEV1 47%) and ongoing tobacco abuse.  Presented 6/9 with progressive dyspnea, cough and fever.  She was admitted by [facility] and treated for AECOPD and possible bronchitis with oral abx, BD's.  On 6/11 she was still not improving and PCCM consulted to assist.    SUBJECTIVE:   Neck and hand cramping similar to prior experience w/ prednisone   VITAL SIGNS: Temp:  [97.8 F (36.6 C)-98.2 F (36.8 C)] 97.8 F (36.6 C) (06/19 0643) Pulse Rate:  [64-68] 64 (06/19 0643) Resp:  [20] 20 (06/19 0643) BP: (120-139)/(52-54) 139/52 mmHg (06/19 0643) SpO2:  [91 %-96 %] 93 % (06/19 0819) Weight:  [175 lb 3.2 oz (79.47 kg)] 175 lb 3.2 oz (79.47 kg) (06/19 0643)  PHYSICAL EXAMINATION: General:  Awake. Alert. No acute distress. Sitting watching TV. No family at bedside.  Integument:  Warm & dry. No rash on exposed skin. No bruising. HEENT: Dry mucus membranes. No oral ulcers. No scleral injection or icterus. PERRL. Cardiovascular:  Regular rate. No edema. No appreciable JVD.  Pulmonary:  No wheeze. Symmetric chest wall expansion. No accessory muscle use. Fine crackles bases Abdomen: Soft. Normal bowel sounds. Nondistended. Grossly nontender. Musculoskeletal:  Normal bulk and tone. No joint deformity or effusion appreciated.   Recent Labs Lab 03/14/16 0504 03/15/16 0518 03/17/16 0448  NA 136 140  --   K 4.7 3.6  --   CL 104 106  --   CO2 26 28  --   BUN 32* 36*  --   CREATININE 1.12* 1.22* 1.05*  GLUCOSE 159* 115*  --     Recent Labs Lab 03/15/16 0518  HGB 12.4  HCT 37.6  WBC 10.9*  PLT 261   No results found.  SIGNIFICANT EVENTS   STUDIES:   ASSESSMENT / PLAN:   70 year old female with acute COPD  exacerbation resulting in acute on chronic hypoxic respiratory failure. Patient has no chronic tobacco use. Now has evidence of aspiration on swallowing evaluation. Seems to be recovering slowly.  Severe COPD with acute exacerbation w/ acute on chronic hypoxic resp failure  Aspiration On-going tobacco abuse  Patient previously intolerant of prednisone and having similar symptoms w/ IV solumedrol (cramping neck and hands) Plan - dc IV steroids - resume spiriva - add dulera  - spiration precautions. Needs follow-up with GI. - Follow-up: Plan for follow-up in  pulmonary clinic  July 26th at [place] [facility] [place] Pager # [phone] OR # [phone] if no answer 03/20/2016, 11:39 AM

## 2016-03-20 NOTE — Discharge Summary (Addendum)
Physician Discharge Summary  Victoria Bird [id number] DOB: 09/15/1946 DOA: 03/10/2016  PCP: Hoyt Koch, MD  Admit date: 03/10/2016 Discharge date: 03/20/2016  Recommendations for Outpatient Follow-up:  1. Ongoing care for COPD  Continue Spiriva, albuterol per discussion 6/19 with Dr. Lake Bells (no Symbicort) 2. Continue to encourage smoking cessation 3. GI followup for intermittent dysphagia (patient will arrrange with her doctor (Dr. Henrene Pastor).  Follow-up Information    Follow up with Tera Partridge, MD On 04/26/2016.   Specialty:  Pulmonary Disease   Why:  330 pm    Contact information:   [place] [address] [phone]       Schedule an appointment as soon as possible for a visit with Scarlette Shorts, MD.   Specialty:  Gastroenterology   Why:  call to schedule outpatient followup   Contact information:   [address] [address] [phone]      Discharge Diagnoses:  1. COPD exacerbation 2. Acute on chronic hypoxic respiratory failure, hypercapnia 3. Tobacco use disorder 4. Diabetes mellitus type 2 5. Chronic low back pain  Discharge Condition: improved Disposition: home  Diet recommendation: diabetic diet  Filed Weights   03/18/16 0517 03/19/16 0634 03/20/16 0643  Weight: 78.3 kg (172 lb 9.9 oz) 78.6 kg (173 lb 4.5 oz) 79.47 kg (175 lb 3.2 oz)    History of present illness:  70yow with COPD admitted for COPD exacerbation.  Hospital Course:  Patient was treated with IV steroids, bronchodilators, antibiotics, supplemental oxygen. Very slow to improve, seen by pulmonology in consultation with ultimate recommendations for discharge home, Symbicort added. Completed a course of antibiotics. Hospitalization was uncomplicated, given intolerance to prednisone, she required IV steroids.  1. COPD exacerbation. Appears resolved. No hypoxia. Good air movement on exam. Pulmonology has discontinued IV steroids. Completed 10  days of Ceftin. 2. Acute on chronic resp failure, hypoxia and hypercapnia 3. Tobacco use disorder, recommend cessation 4. DM type II, stable. 5. Chronic low back pain, stable  Consultants:  Pulmonology  ST  Diet recommendations: Regular;Thin liquid Liquids provided via: Cup;Straw Medication Administration: Whole meds with liquid (large pills with peanut butter) Compensations: Slow rate;Small sips/bites (start meals with liquids) Postural Changes and/or Swallow Maneuvers: Seated upright 90 degrees;Upright 30-60 min after meal  Procedures:  None  Antimicrobials:  Cefuroxime 6/9 >> 6/19  Discharge Instructions   Current Discharge Medication List    CONTINUE these medications which have NOT CHANGED   Details  albuterol (PROVENTIL HFA;VENTOLIN HFA) 108 (90 Base) MCG/ACT inhaler Inhale 1-2 puffs into the lungs every 4 (four) hours as needed. For shortness of breath. Qty: 1 Inhaler, Refills: 3   Associated Diagnoses: COPD exacerbation ([name])    ALPRAZolam (XANAX) 0.5 MG tablet Take 1 tablet (0.5 mg total) by mouth at bedtime as needed for anxiety. Qty: 30 tablet, Refills: 0    losartan-hydrochlorothiazide (HYZAAR) 100-12.5 MG tablet Take 1 tablet by mouth daily. Qty: 90 tablet, Refills: 3    metFORMIN (GLUCOPHAGE) 500 MG tablet Take 1 tablet (500 mg total) by mouth 2 (two) times daily with a meal. Qty: 60 tablet, Refills: 2   Associated Diagnoses: Diabetes mellitus due to underlying condition with hyperosmolarity without coma, without long-term current use of insulin ([name])    pantoprazole (PROTONIX) 40 MG tablet Take 1 tablet (40 mg total) by mouth daily. Qty: 90 tablet, Refills: 3    tiotropium (SPIRIVA) 18 MCG inhalation capsule Place 1 capsule (18 mcg total) into inhaler and inhale  daily. Qty: 30 capsule, Refills: 0      STOP taking these medications     ipratropium (ATROVENT) 0.02 % nebulizer solution        Allergies  Allergen Reactions  . Aspartame And  Phenylalanine Nausea And Vomiting    Patient says she is allergic to all artificial sweeteners  . Doxycycline Other (See Comments)    Nausea, vomiting, HA, double vision  . Tramadol Shortness Of Breath and Nausea Only  . Codeine Itching    Has not tried benadryl to alleviate side effects  . Neurontin [Gabapentin] Other (See Comments)    Dizzy, "drugged" feeling, sleepy  . Sulfa Antibiotics Other (See Comments)    Kidney problem   . Penicillins Rash    Has patient had a PCN reaction causing immediate rash, facial/tongue/throat swelling, SOB or lightheadedness with hypotension: No Has patient had a PCN reaction causing severe rash involving mucus membranes or skin necrosis: No Has patient had a PCN reaction that required hospitalization No Has patient had a PCN reaction occurring within the last 10 years: No If all of the above answers are "NO", then may proceed with Cephalosporin use.  . Prednisone Rash    Patient stated she received Prednisone while she was in the hospital and experienced a rash and "extreme pain.'    The results of significant diagnostics from this hospitalization (including imaging, microbiology, ancillary and laboratory) are listed below for reference.    Significant Diagnostic Studies: Dg Chest 2 View  03/16/2016  CLINICAL DATA:  COPD shortness of breath cough smoker EXAM: CHEST  2 VIEW COMPARISON:  03/10/2016 FINDINGS: The heart size and vascular pattern are within normal limits. There is a band of opacity in the right perihilar area most consistent with discoid atelectasis, stable. Minimal left base atelectasis also stable. No consolidation or effusion. IMPRESSION: No change from prior study Electronically Signed   By: Skipper Cliche M.D.   On: 03/16/2016 10:54   Dg Chest 2 View  03/10/2016  CLINICAL DATA:  Initial evaluation for acute shortness of breath. History COPD. Smoking. EXAM: CHEST  2 VIEW COMPARISON:  Prior radiograph from 12/12/2015. FINDINGS: Cardiac  mediastinal silhouettes are stable in size and contour, and remain within normal limits. Changes related COPD present. Mild bibasilar atelectasis. No focal infiltrates identified. No pulmonary edema or pleural effusion. No pneumothorax. No acute osseous abnormality. IMPRESSION: 1. Mild bibasilar atelectasis. No other active cardiopulmonary disease. 2. COPD. Electronically Signed   By: Jeannine Boga M.D.   On: 03/10/2016 01:08      Labs: Basic Metabolic Panel:  Recent Labs Lab 03/14/16 0504 03/15/16 0518 03/17/16 0448  NA 136 140  --   K 4.7 3.6  --   CL 104 106  --   CO2 26 28  --   GLUCOSE 159* 115*  --   BUN 32* 36*  --   CREATININE 1.12* 1.22* 1.05*  CALCIUM 9.1 8.3*  --     CBC:  Recent Labs Lab 03/15/16 0518  WBC 10.9*  HGB 12.4  HCT 37.6  MCV 86.0  PLT 261     CBG:  Recent Labs Lab 03/19/16 1158 03/19/16 1655 03/19/16 2116 03/20/16 0741 03/20/16 1216  GLUCAP 161* 200* 179* 114* 220*    Principal Problem:   COPD with exacerbation ([name]) Active Problems:   COPD (chronic obstructive pulmonary disease) ([name])   Chronic pain syndrome   Diabetes mellitus type 2, controlled ([place])   Tobacco use disorder   Time coordinating discharge:  35 minutes  Signed:  Murray Hodgkins, MD [facility] 03/20/2016, 1:15 PM

## 2016-03-20 NOTE — Care Management Note (Signed)
Case Management Note  Patient Details  Name: Victoria Bird MRN: [id number] Date of Birth: 06/19/1946  Subjective/Objective:PAtient given medicare Important message notice of appeal rights-patient voiced understanding, stated" I know all about that appeal stuff, & I don't want to do it". Explained that the Dr has d/c her, she has the right to appeal-she declines to appeal. Asked if she wanted a [facility] nurse to make sure she was doing ok @ home-she declined. She stated that she would have a family member to pick her up but it would be late today-Nurse notified.                    Action/Plan:d/c plan home.   Expected Discharge Date:                  Expected Discharge Plan:  Home/Self Care  In-House Referral:     Discharge planning Services  CM Consult  Post Acute Care Choice:    Choice offered to:     DME Arranged:    DME Agency:     HH Arranged:    [place] Agency:     Status of Service:  Completed, signed off  Medicare Important Message Given:  Yes Date Medicare IM Given:    Medicare IM give by:    Date Additional Medicare IM Given:    Additional Medicare Important Message give by:     If discussed at [place] of Stay Meetings, dates discussed:    Additional Comments:  [name], RN 03/20/2016, 2:22 PM

## 2016-03-20 NOTE — Care Management Note (Signed)
Case Management Note  Patient Details  Name: KASEN FORKEY MRN: [id number] Date of Birth: [birth year]  Subjective/Objective:                    Action/Plan:d/c home no needs or orders.   Expected Discharge Date:                  Expected Discharge Plan:  Home/Self Care  In-House Referral:     Discharge planning Services  CM Consult  Post Acute Care Choice:    Choice offered to:     DME Arranged:    DME Agency:     HH Arranged:    [place] Agency:     Status of Service:  Completed, signed off  Medicare Important Message Given:  Yes Date Medicare IM Given:    Medicare IM give by:    Date Additional Medicare IM Given:    Additional Medicare Important Message give by:     If discussed at [place] of Stay Meetings, dates discussed:    Additional Comments:  [name], RN 03/20/2016, 1:37 PM

## 2016-03-20 NOTE — Progress Notes (Signed)
Pt denies chest pain at this time. Pt c/o neck pain rated 3/10 at this time. Writer told pt when she was ready for pain medication to call and request it.

## 2016-03-20 NOTE — Progress Notes (Signed)
PROGRESS NOTE  Victoria Bird [id number] DOB: [birth year] DOA: 03/10/2016 PCP: Hoyt Koch, MD Dr. Ashok Cordia  Brief Narrative: [phone] with COPD admitted for COPD exacerbation, very slow to improve, seen by pulmonology. Gradual improvement and steroids tapered, hospitalization prolonged by need for IV steroids (intolerant to oral). Will f/u with pulmonology as an outaptient.  Assessment/Plan: 1. COPD exacerbation. Appears resolved. No hypoxia. Good air movement on exam. Pulmonology has discontinued IV steroids. Completed 10 days of Ceftin. 2. Acute on chronic resp failure, hypoxia and hypercapnia 3. Tobacco use disorder, recommend cessation 4. DM type II, stable. 5. Chronic low back pain, stable   Appears to be doing well. No hypoxia, respiratory status stable. Appears to have reached maximum benefit from hospitalization. Steroids have been discontinued per pulmonology.  Recommend GI follow up with primary GI (Dr. Henrene Pastor) as an outpatient for intermittent difficulty swallowing.  DVT prophylaxis: Lovenox Code Status: full code Family Communication: none Disposition Plan: home  Murray Hodgkins, MD  [facility] Direct contact: [phone] --[place]  --[email or web address]; password [email or web address]  7PM-7AM contact night coverage as above 03/20/2016, 11:04 AM  LOS: 10 days   Consultants:  Pulmonology  ST  Diet recommendations: Regular;Thin liquid Liquids provided via: Cup;Straw Medication Administration: Whole meds with liquid (large pills with peanut butter) Compensations: Slow rate;Small sips/bites (start meals with liquids) Postural Changes and/or Swallow Maneuvers: Seated upright 90 degrees;Upright 30-60 min after meal  Procedures:  None   Antimicrobials:  Cefuroxime 6/9 >> 6/19  HPI/Subjective: Pulm has stopped IV steroids. Patient reports chronic back pain. Breathing seems to be doing well.  Objective: Filed Vitals:   03/19/16 1403 03/19/16 2040  03/20/16 0643 03/20/16 0819  BP:  130/52 139/52   Pulse:  65 64   Temp:  98.2 F (36.8 C) 97.8 F (36.6 C)   TempSrc:  Oral Oral   Resp:  20 20   Height:      Weight:   79.47 kg (175 lb 3.2 oz)   SpO2: 91% 96% 93% 93%    Intake/Output Summary (Last 24 hours) at 03/20/16 1104 Last data filed at 03/20/16 0918  Gross per 24 hour  Intake   1080 ml  Output      0 ml  Net   1080 ml     Filed Weights   03/18/16 0517 03/19/16 0634 03/20/16 0643  Weight: 78.3 kg (172 lb 9.9 oz) 78.6 kg (173 lb 4.5 oz) 79.47 kg (175 lb 3.2 oz)    Exam:    Constitutional:  . Appears calm and comfortable Respiratory:  . CTA bilaterally, no w/r/r.  . Respiratory effort normal. No retractions or accessory muscle use Cardiovascular:  . RRR, no m/r/g . No LE extremity edema   . Telemetry SR Musculoskeletal:  . Normal RUE, LUE, RLE, LLE   o strength and tone normal, no atrophy, no abnormal movements Psychiatric:  . Mental status o Mood appropriate, appears mildly anxious  I have personally reviewed following labs and imaging studies:  CBG stable  Scheduled Meds: . budesonide (PULMICORT) nebulizer solution  0.5 mg Nebulization BID  . cefUROXime  500 mg Oral BID WC  . chlorpheniramine-HYDROcodone  5 mL Oral Q12H  . enoxaparin (LOVENOX) injection  40 mg Subcutaneous Daily  . losartan  100 mg Oral Daily   And  . hydrochlorothiazide  12.5 mg Oral Daily  . insulin aspart  0-9 Units Subcutaneous TID WC  . ipratropium-albuterol  3 mL Nebulization Q6H WA  . metFORMIN  500  mg Oral BID WC  . methylPREDNISolone (SOLU-MEDROL) injection  40 mg Intravenous Daily  . pantoprazole  40 mg Oral Daily   Continuous Infusions:   Active Problems:   Hypoxia   COPD with exacerbation ([name])   COPD exacerbation ([place])   LOS: 10 days   Time spent 25 minutes

## 2016-03-20 NOTE — Care Management Important Message (Signed)
Important Message  Patient Details  Name: Victoria Bird MRN: [id number] Date of Birth: [birth year]   Medicare Important Message Given:  Yes    [name], RN 03/20/2016, 2:27 PM

## 2016-03-20 NOTE — Progress Notes (Signed)
Writer went into pt room to give pt  discharge instructions. Pt stated " know one cares around here about me. Y'all just want me out. One doctor says one thing and another says something else." Writer explained to pt that writer would call Dr Sarajane Jews and talk to him about her discharge. MD notified of above and said that he had talked with pulmonary and that they where ok with pt being dc home today. Writer also had CM talk with pt about her pt rights. After CM talked with pt,  Writer went in to pt room to explain what MD said and pt became very upset and yelling about not having pain medication when she goes home and not being able to care for her self. Writer explained to pt that she had been doing all of her ADL care while here in the hospital her self with no help from [place]. And pt stated " I know I have. I can walk and wash myself.I don't need help with that." Writer then ask pt what did she mean by the comment of not being able to care for her self and pt started talking about not having pain medication for home.  Writer ask pt if she had spoken to the MD about pain medication when she goes home and pt stated " No, why should I. He's not going to give me any Narcotics". MD text paged of pt request for pain medication when pt goes home. After paging MD, pt started complaining of chest pressure in mid chest.vital signs and EKG done.  MD notified of chest pain.  MD returned paged and stated "I will not give her any narcotic at discharge. She'll have to follow up with her primary care Dr for narcotics. I will follow up on EKG. MD notified EKG was ready for review in Epic. MD returned paged and stated he reviewed EKG and pt could still be discharged home today. CN made aware of situation and will go over discharge paper work with pt.

## 2016-03-21 ENCOUNTER — Telehealth: Payer: Self-pay

## 2016-03-21 NOTE — Telephone Encounter (Signed)
Pt was on TCM list for 03/21/2016  DX for COPD, Hypoxia, Dysphagia. Dc'ed on 03/20/2016 Fu w pulmology for COPD and GI for Dysphagia

## 2016-03-22 ENCOUNTER — Telehealth: Payer: Self-pay | Admitting: Internal Medicine

## 2016-03-22 ENCOUNTER — Other Ambulatory Visit: Payer: Self-pay

## 2016-03-22 DIAGNOSIS — R1310 Dysphagia, unspecified: Secondary | ICD-10-CM

## 2016-03-22 NOTE — Telephone Encounter (Signed)
Colon cancelled. Pt scheduled to see Dr. Henrene Pastor 05/15/16@10 :45am. Barium swallow scheduled at [name] 03/29/16@11 :30am, pt to arrive there at 11:15am. Pt to be NPO after midnight.

## 2016-03-22 NOTE — Telephone Encounter (Signed)
Pt states she was hospitalized recently for copd, breathing difficulties. States while she was in the hospital she has problems choking on food. Pt has colon scheduled with you at [facility] 04/06/16. Hospital notes states she needed to follow-up with GI due to dysphagia. Please advise.

## 2016-03-22 NOTE — Telephone Encounter (Signed)
Cancel colonoscopy, scheduled barium swallow for dysphagia, stop smoking, routine office appointment with me in August to reassess her overall health status.

## 2016-03-29 ENCOUNTER — Ambulatory Visit (HOSPITAL_COMMUNITY)
Admission: RE | Admit: 2016-03-29 | Discharge: 2016-03-29 | Disposition: A | Discharge status: Home / Self Care | Payer: Medicare Other | Source: Ambulatory Visit | Attending: Internal Medicine | Admitting: Internal Medicine

## 2016-03-29 DIAGNOSIS — R1310 Dysphagia, unspecified: Secondary | ICD-10-CM | POA: Insufficient documentation

## 2016-03-29 DIAGNOSIS — R4702 Dysphasia: Secondary | ICD-10-CM | POA: Diagnosis not present

## 2016-03-30 ENCOUNTER — Ambulatory Visit: Payer: Medicare Other | Admitting: Internal Medicine

## 2016-04-06 ENCOUNTER — Encounter (HOSPITAL_COMMUNITY): Payer: Self-pay

## 2016-04-06 ENCOUNTER — Ambulatory Visit (HOSPITAL_COMMUNITY): Admit: 2016-04-06 | Payer: Medicare Other | Admitting: Internal Medicine

## 2016-04-06 SURGERY — COLONOSCOPY
Anesthesia: Monitor Anesthesia Care

## 2016-04-26 ENCOUNTER — Ambulatory Visit (INDEPENDENT_AMBULATORY_CARE_PROVIDER_SITE_OTHER): Payer: Medicare Other | Admitting: Pulmonary Disease

## 2016-04-26 ENCOUNTER — Encounter: Payer: Self-pay | Admitting: Pulmonary Disease

## 2016-04-26 VITALS — BP 142/74 | HR 90 | Ht 63.0 in | Wt 166.8 lb

## 2016-04-26 DIAGNOSIS — G4733 Obstructive sleep apnea (adult) (pediatric): Secondary | ICD-10-CM | POA: Diagnosis not present

## 2016-04-26 DIAGNOSIS — F17200 Nicotine dependence, unspecified, uncomplicated: Secondary | ICD-10-CM | POA: Diagnosis not present

## 2016-04-26 DIAGNOSIS — J449 Chronic obstructive pulmonary disease, unspecified: Secondary | ICD-10-CM | POA: Diagnosis not present

## 2016-04-26 NOTE — Patient Instructions (Addendum)
   Continue using your Spiriva and inhaler medications as prescribed.  I will see you back in 3 months or sooner if needed.  Call me if you feel your breathing is getting worse.

## 2016-04-26 NOTE — Progress Notes (Signed)
Subjective:    Patient ID: Victoria Bird, female    DOB: 1946/01/06, 70 y.o.   MRN: [id number]  C.C.:  Follow-up with Severe COPD, OSA, GERD, & Tobacco Use Disoder.  HPI  Follow-up after recent hospitalization with COPD exacerbation 6/9 - 6/19.  Severe COPD:  Continued on Spiriva after discharge. Treated during admission with IV Solu-Medrol. She reports she has returned to her baseline dyspnea. She reports intermittent wheezing, mostly nocturnal. No significant coughing.   OSA: Last polysomnogram was 2010.  Previously declined CPAP therapy due to claustrophobia.  GERD:  Still having dyspnea and feeling full when eating. She reports she is still having diarrhea. Compliant with Protonix. No morning brash water taste.  Tobacco Use Disorder: Previously patient reports she has been unable to quit due to psychosocial stressors. She reports she is still smoking intermittently.   Review of Systems She reports poor quality of sleep at night. She reports using her Xanax at night will only help her sleep for 1.5-2 hours at a time. She feels like pain is her main barrier to falling & staying asleep. She reports occasional chills and sweats but no subjective fever.  Allergies  Allergen Reactions  . Aspartame And Phenylalanine Nausea And Vomiting    Patient says she is allergic to all artificial sweeteners  . Doxycycline Other (See Comments)    Nausea, vomiting, HA, double vision  . Tramadol Shortness Of Breath and Nausea Only  . Codeine Itching    Has not tried benadryl to alleviate side effects  . Neurontin [Gabapentin] Other (See Comments)    Dizzy, "drugged" feeling, sleepy  . Sulfa Antibiotics Other (See Comments)    Kidney problem   . Penicillins Rash    Has patient had a PCN reaction causing immediate rash, facial/tongue/throat swelling, SOB or lightheadedness with hypotension: No Has patient had a PCN reaction causing severe rash involving mucus membranes or skin necrosis: No Has  patient had a PCN reaction that required hospitalization No Has patient had a PCN reaction occurring within the last 10 years: No If all of the above answers are "NO", then may proceed with Cephalosporin use.  . Prednisone Rash    Patient stated she received Prednisone while she was in the hospital and experienced a rash and "extreme pain.'   Current Outpatient Prescriptions on File Prior to Visit  Medication Sig Dispense Refill  . albuterol (PROVENTIL HFA;VENTOLIN HFA) 108 (90 Base) MCG/ACT inhaler Inhale 1-2 puffs into the lungs every 4 (four) hours as needed. For shortness of breath. 1 Inhaler 3  . ALPRAZolam (XANAX) 0.5 MG tablet Take 1 tablet (0.5 mg total) by mouth at bedtime as needed for anxiety. 30 tablet 0  . losartan-hydrochlorothiazide (HYZAAR) 100-12.5 MG tablet Take 1 tablet by mouth daily. 90 tablet 3  . metFORMIN (GLUCOPHAGE) 500 MG tablet Take 1 tablet (500 mg total) by mouth 2 (two) times daily with a meal. 60 tablet 2  . pantoprazole (PROTONIX) 40 MG tablet Take 1 tablet (40 mg total) by mouth daily. 90 tablet 3  . tiotropium (SPIRIVA) 18 MCG inhalation capsule Place 1 capsule (18 mcg total) into inhaler and inhale daily. 30 capsule 0   No current facility-administered medications on file prior to visit.    Past Medical History:  Diagnosis Date  . Anxiety   . Chronic pain   . Colon polyps    adenomatous  . COPD (chronic obstructive pulmonary disease) ([place])   . Diabetes mellitus   . Diverticulitis   .  Diverticulosis   . Emphysema lung ([place])   . Gall stones   . GERD (gastroesophageal reflux disease)   . H/O: pneumonia   . High cholesterol   . Hypertension   . OSA (obstructive sleep apnea)   . Tracheobronchitis 01/01/2012   Past Surgical History:  Procedure Laterality Date  . ABDOMINAL HYSTERECTOMY    . APPENDECTOMY    . Bilateral shoulder surgery    . CARPAL TUNNEL RELEASE Left   . CHOLECYSTECTOMY    . COLONOSCOPY    . ESOPHAGOGASTRODUODENOSCOPY    .  FRACTURE SURGERY    . Right knee surgery    . ROTATOR CUFF REPAIR     Family History  Problem Relation Age of Onset  . Heart attack Mother   . Diabetes Maternal Grandfather   . Heart attack Maternal Grandfather   . Hypertension Maternal Grandfather   . Stroke Maternal Grandfather   . Thyroid disease Neg Hx   . Lung disease Neg Hx   . Heart attack Maternal Grandmother   . Hypertension Maternal Grandmother   . Alcohol abuse Father   . Colon cancer Neg Hx    Social History   Social History  . Marital status: Divorced    Spouse name: N/A  . Number of children: 3  . Years of education: GED   Occupational History  . Disability    Social History Main Topics  . Smoking status: Current Every Day Smoker    Packs/day: 0.50    Years: 41.00    Types: Cigarettes    Start date: 03/19/1974  . Smokeless tobacco: Never Used     Comment: Peak rate of 1.5ppd, tobacco infor given  . Alcohol use No  . Drug use: No  . Sexual activity: No   Other Topics Concern  . None   Social History Narrative   Daughter Edmonia Lynch) [phone]   Originally from [place]. Previously lived in [place]. She has traveled from [facility] to [place] & has also traveled across country to [place]. Multiple visits to [place], [place], [place], [place], [place]. She has a dog at home. No bird, mold, or hot tub exposure. She reports some years ago she did live in a home that had black mold in her closet. 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       Pt lives with her youngest daughter, lives in a trailer, does have stairs into home. Does have some troubled with that. Highest level of education is GED.       Objective:   Physical Exam BP (!) 142/74 (BP Location: Left Arm, Cuff Size: Normal)   Pulse 90   Ht 5\' 3"  (1.6 m)   Wt 166 lb 12.8 oz (75.7 kg)   SpO2 93%   BMI 29.55 kg/m  General:  Awake. Appears  comfortable. No distress. Integument:  Warm & dry. No rash on exposed skin. HEENT:  Moist mucus membranes. No scleral icterus. Poor dentition. Cardiovascular:  Regular rate & rhythm. No edema. Normal S1 & S2. Pulmonary:  Good aeration in the bases. Normal work of breathing on room air without accessory muscle use. Overall clear to auscultation. Abdomen: Soft. Normal bowel sounds. No mass appreciated.  PFT 01/28/16: FVC 2.31 L (77%) FEV1 1.16 L (81%) FEV1/FVC 0.50 FEF 25-75 0.47 L (24%) no bronchodilator response 06/09/15: FVC 2.21 L (73%) FEV1 1.08 L (47%) FEV1/FVC 0.49  FEF 25-75 0.43 L (22%) no bronchodilator response TLC 5.26 L (105%) RV 135% ERV 87% DLCO corrected 36% (Hgb 16.2)  6MWT 10/20/15:  Walked 240 meters / Baseline Sat 94% on RA / Nadir Sat 90% on RA 06/22/15:  3 laps around office. Baseline 93% on RA. Nadir Sat 89% on RA. 06/18/15:  Walked in hospital by PT / Baseline Sat 90% on RA / Nadir Sat 86% on RA (required 3 L/m on exertion to maintain)  IMAGING CXR PA/LAT 10/08/14 (previously reviewed by me): Mild persistent linear opacity consistent with scar at the right lung base. Kyphosis noted. Hyperinflation with some flattening of the diaphragm. No pleural effusion or thickening appreciated. No parenchymal opacity or nodule appreciated. Heart normal in size. Mediastinum: Contour.  CT CHEST W/ 04/16/02 (per radiologist): No evidence for pulmonary emboli. No pathologically enlarged mediastinal or hilar lymph nodes. Enlarged lower pole of left thyroid lobe with a nodular configuration. Mild water density alveolitis with an anterior portion of left upper lobe & medial anterior portion of right upper lobe. No parenchymal nodules.   CARDIAC TTE (01/08/13): LV normal in size. EF 60-65%. Normal wall motion without regional abnormality. LA & RA normal in size. RV normal in size and function. Pulmonary artery systolic pressure 43 mmHg. Trivial aortic regurgitation. No mitral stenosis or  regurgitation. No pulmonic or tricuspid regurgitation. No pericardial effusion.  LABS 05/27/15 Alpha-1 antitrypsin: 164  04/29/15 BMP: 140/4.2/102/24/16/1.01/93/9 LFT: 4/6.7/0.7/67/14/13  10/09/14 VBG (on room air): 7.39/37/59  03/24/13 ANA: Negative  RF:  <7.0    Assessment & Plan:  70 year old female with underlying severe COPD. Patient has ongoing tobacco use likely contributing to her continued dyspnea and COPD. She continues to report diffuse pain which is of unclear etiology. Patient has never had a pain specialist evaluation. Continuing to smoke tobacco with multiple psychosocial stressors. I would be extremely reluctant to try the patient on Wellbutrin or Chantix. I instructed the patient to contact my office if she had any new breathing problems before her next appointment.  1. Severe COPD: Continuing patient on Spiriva. No changes at this time.  2. GERD: Following with GI for evaluation of her dyspepsia and swallowing problems. Continuing on Protonix. 3. OSA: Previously declined CPAP therapy.  4. Pain: I have sent a message to her primary care physician inquiring as to her thoughts on a referral to a pain specialist.  5. Tobacco Use Disorder: Spent over 3 minutes counseling the patient on the need for complete tobacco cessation. 6. Health Maintenance:  Previously had  Pneumovax April 2017 & Tdap April 2017. Plan for Influenza Vaccine in the Fall & Prevnar Vaccine in April 2018. 7. Follow-up: Return to clinic in  3 months or sooner if needed.  I spent over 35 minutes of time with the patient during her visit today in face-to-face discussion.  Sonia Baller Ashok Cordia, M.D. [facility] Pager:  [phone] After 3pm or if no response, call [phone] 4:07 PM 04/26/16

## 2016-05-15 ENCOUNTER — Telehealth: Payer: Self-pay | Admitting: Pulmonary Disease

## 2016-05-15 ENCOUNTER — Encounter: Payer: Self-pay | Admitting: Internal Medicine

## 2016-05-15 ENCOUNTER — Ambulatory Visit (INDEPENDENT_AMBULATORY_CARE_PROVIDER_SITE_OTHER): Payer: Medicare Other | Admitting: Internal Medicine

## 2016-05-15 VITALS — BP 120/70 | HR 80 | Ht 63.0 in | Wt 165.2 lb

## 2016-05-15 DIAGNOSIS — Z86010 Personal history of colonic polyps: Secondary | ICD-10-CM | POA: Diagnosis not present

## 2016-05-15 DIAGNOSIS — R197 Diarrhea, unspecified: Secondary | ICD-10-CM | POA: Diagnosis not present

## 2016-05-15 DIAGNOSIS — R140 Abdominal distension (gaseous): Secondary | ICD-10-CM

## 2016-05-15 DIAGNOSIS — K219 Gastro-esophageal reflux disease without esophagitis: Secondary | ICD-10-CM

## 2016-05-15 DIAGNOSIS — K589 Irritable bowel syndrome without diarrhea: Secondary | ICD-10-CM | POA: Diagnosis not present

## 2016-05-15 NOTE — Progress Notes (Signed)
HISTORY OF PRESENT ILLNESS:  Victoria Bird is a 70 y.o. female with MULTIPLE SIGNIFICANT medical problems including severe COPD with ongoing tobacco abuse, hypertension, and chronic pain syndrome. She also has a history of diverticulitis for which she was hospitalized March 2017. She was evaluated 02/29/2016 with multiple chronic GI complaints and the need for surveillance colonoscopy. Previous colonoscopies elsewhere in 2008 and 2013 revealing diverticulosis and multiple polyps which were sessile serrated polyps. See that dictation for details. She was scheduled for surveillance colonoscopy at the hospital. However, this was canceled as she was hospitalized for 10 days in June with exacerbation of her COPD. During that hospitalization she complained of difficulty swallowing. She describes coughing or choking spells with solids and liquids. For this reason we ordered a barium esophagram which was performed 03/29/2016. Examination was normal including passage of a 13 mm tablet with ease. Today she continues with many of the same chronic GI complaints. She tells me however that her bowel habits are more loose than normal. Daily diarrhea since hospital discharge. She was treated with antibiotics and steroids. He continues to complain of pain in all aspects of her body. Apparently being referred to a pain clinic. She continues with significant dyspnea with minimal exertion. She continues to smoke. Still weak. No bleeding. No recurrent abdominal pain.  REVIEW OF SYSTEMS:  All non-GI ROS negative except for back pain, arthritis, body aches, shortness of breath  Past Medical History:  Diagnosis Date  . Anxiety   . Chronic pain   . Colon polyps    adenomatous  . COPD (chronic obstructive pulmonary disease) ([place])   . Diabetes mellitus   . Diverticulitis   . Diverticulosis   . Emphysema lung ([place])   . Gall stones   . GERD (gastroesophageal reflux disease)   . H/O: pneumonia   . High cholesterol   .  Hypertension   . OSA (obstructive sleep apnea)   . Tracheobronchitis 01/01/2012    Past Surgical History:  Procedure Laterality Date  . ABDOMINAL HYSTERECTOMY    . APPENDECTOMY    . Bilateral shoulder surgery    . CARPAL TUNNEL RELEASE Left   . CHOLECYSTECTOMY    . COLONOSCOPY    . ESOPHAGOGASTRODUODENOSCOPY    . FRACTURE SURGERY    . Right knee surgery    . ROTATOR CUFF REPAIR      Social History Victoria Bird  reports that she has been smoking Cigarettes.  She started smoking about 42 years ago. She has a 20.50 pack-year smoking history. She has never used smokeless tobacco. She reports that she does not drink alcohol or use drugs.  family history includes Alcohol abuse in her father; Diabetes in her maternal grandfather; Heart attack in her maternal grandfather, maternal grandmother, and mother; Hypertension in her maternal grandfather and maternal grandmother; Stroke in her maternal grandfather.  Allergies  Allergen Reactions  . Aspartame And Phenylalanine Nausea And Vomiting    Patient says she is allergic to all artificial sweeteners  . Doxycycline Other (See Comments)    Nausea, vomiting, HA, double vision  . Tramadol Shortness Of Breath and Nausea Only  . Codeine Itching    Has not tried benadryl to alleviate side effects  . Neurontin [Gabapentin] Other (See Comments)    Dizzy, "drugged" feeling, sleepy  . Sulfa Antibiotics Other (See Comments)    Kidney problem   . Penicillins Rash    Has patient had a PCN reaction causing immediate rash, facial/tongue/throat swelling, SOB or lightheadedness  with hypotension: No Has patient had a PCN reaction causing severe rash involving mucus membranes or skin necrosis: No Has patient had a PCN reaction that required hospitalization No Has patient had a PCN reaction occurring within the last 10 years: No If all of the above answers are "NO", then may proceed with Cephalosporin use.  . Prednisone Rash    Patient stated she  received Prednisone while she was in the hospital and experienced a rash and "extreme pain.'       PHYSICAL EXAMINATION: Vital signs: BP 120/70 (BP Location: Left Arm, Patient Position: Sitting, Cuff Size: Normal)   Pulse 80   Ht 5\' 3"  (1.6 m)   Wt 165 lb 3.2 oz (74.9 kg)   BMI 29.26 kg/m   Constitutional: Obese, chronically ill-appearing, no acute distress. Reeks of tobacco Psychiatric: alert and oriented x3, cooperative Eyes: extraocular movements intact, anicteric, conjunctiva pink Mouth: oral pharynx moist, no lesions. No thrush Neck: supple without thyromegaly Lymph: no lymphadenopathy Cardiovascular: heart regular rate and rhythm, no murmur Lungs: clear to auscultation bilaterally Abdomen: soft, obese, nontender, nondistended, no obvious ascites, no peritoneal signs, normal bowel sounds, no organomegaly Rectal: Omitted Extremities: no clubbing cyanosis or lower extremity edema bilaterally Skin: no lesions on visible extremities Neuro: No focal deficits. Cranial nerves intact  ASSESSMENT:  #1. Diarrhea. Rule out Clostridium difficile #2. Chronic abdominal complaints with alternating bowel habits and bloating. Most consistent with irritable bowel #3. History of sessile serrated polyps. Surveillance was commended around December 2016. She is not fit to undergo routine surveillance at this time #4. Oropharyngeal type dysphagia. Negative esophagram #5. GERD. Seemingly controlled with PPI    PLAN:  #1. Stool for Clostridium difficile by PCR #2. His stool study negative then resume fiber in the form of Metamucil. May use low-dose antidiarrheal if necessary #3. Discussion on IBS and bloating #4. Reflux precautions #5. Weight loss #6. Stop smoking #7. Recommend the patient return to this office in 1 year for reassessment. We could consider surveillance colonoscopy if her overall health permits. She understands the rationale and limitations of this approach 25 minutes spent  face-to-face with the patient. Greater than 50% a time use for counseling regarding her multiple chronic GI issues and recent problems with worsening diarrhea as well as postponement of routine surveillance colonoscopy given her current health issues and the acceptable high-risk at present

## 2016-05-15 NOTE — Telephone Encounter (Signed)
LMOMTCB x 1 

## 2016-05-15 NOTE — Patient Instructions (Signed)
Your physician has requested that you go to the basement for the following lab work before leaving today: C diff  Please follow up on one year

## 2016-05-15 NOTE — Telephone Encounter (Signed)
Spoke with pt. She states that at her ov with JN on 04/26/16 she talked to him about chantix and would like for him to send a prescription into [facility]. I explained to her that I will need to send a message to him for approval and that he is out of the office today but will return on 05/16/16. She also wanted to address her appointment with the pain specialist. I explained to her that her PCP will be the office that will address the referral for the pain specialist. She voiced understanding and had no further questions.   JN please advise on chantix

## 2016-05-15 NOTE — Telephone Encounter (Signed)
I would like to hold off on Chantix until her pain is better controlled. There are significant potential side effects and before we try medications I want to have her other contributing stressors as well controlled as possible. Thanks.

## 2016-05-16 NOTE — Telephone Encounter (Signed)
Pt aware of JN recommendations. Pt voices understanding. Pt states she will reach out to her PCP to f/u on pain clinic.  Nothing further needed.

## 2016-05-16 NOTE — Telephone Encounter (Signed)
lmtcb x2 for pt. 

## 2016-05-16 NOTE — Telephone Encounter (Signed)
[phone] pt calling back

## 2016-05-18 ENCOUNTER — Other Ambulatory Visit: Payer: Medicare Other

## 2016-05-18 DIAGNOSIS — R197 Diarrhea, unspecified: Secondary | ICD-10-CM

## 2016-05-18 DIAGNOSIS — K580 Irritable bowel syndrome with diarrhea: Secondary | ICD-10-CM | POA: Diagnosis not present

## 2016-05-19 LAB — CLOSTRIDIUM DIFFICILE BY PCR: CDIFFPCR: NOT DETECTED

## 2016-06-02 ENCOUNTER — Other Ambulatory Visit: Payer: Self-pay | Admitting: Family

## 2016-06-02 DIAGNOSIS — E0800 Diabetes mellitus due to underlying condition with hyperosmolarity without nonketotic hyperglycemic-hyperosmolar coma (NKHHC): Secondary | ICD-10-CM

## 2016-06-07 ENCOUNTER — Emergency Department (HOSPITAL_COMMUNITY): Payer: Medicare Other

## 2016-06-07 ENCOUNTER — Encounter (HOSPITAL_COMMUNITY): Payer: Self-pay | Admitting: *Deleted

## 2016-06-07 ENCOUNTER — Inpatient Hospital Stay (HOSPITAL_COMMUNITY)
Admission: EM | Admit: 2016-06-07 | Discharge: 2016-06-11 | DRG: 204 | Disposition: A | Discharge status: Home / Self Care | Payer: Medicare Other | Attending: Internal Medicine | Admitting: Internal Medicine

## 2016-06-07 DIAGNOSIS — R0781 Pleurodynia: Principal | ICD-10-CM | POA: Diagnosis present

## 2016-06-07 DIAGNOSIS — K5730 Diverticulosis of large intestine without perforation or abscess without bleeding: Secondary | ICD-10-CM | POA: Diagnosis not present

## 2016-06-07 DIAGNOSIS — M545 Low back pain, unspecified: Secondary | ICD-10-CM

## 2016-06-07 DIAGNOSIS — N182 Chronic kidney disease, stage 2 (mild): Secondary | ICD-10-CM | POA: Diagnosis not present

## 2016-06-07 DIAGNOSIS — I129 Hypertensive chronic kidney disease with stage 1 through stage 4 chronic kidney disease, or unspecified chronic kidney disease: Secondary | ICD-10-CM | POA: Diagnosis present

## 2016-06-07 DIAGNOSIS — F419 Anxiety disorder, unspecified: Secondary | ICD-10-CM | POA: Diagnosis present

## 2016-06-07 DIAGNOSIS — I714 Abdominal aortic aneurysm, without rupture, unspecified: Secondary | ICD-10-CM | POA: Diagnosis present

## 2016-06-07 DIAGNOSIS — R0789 Other chest pain: Secondary | ICD-10-CM | POA: Diagnosis not present

## 2016-06-07 DIAGNOSIS — Z880 Allergy status to penicillin: Secondary | ICD-10-CM

## 2016-06-07 DIAGNOSIS — J449 Chronic obstructive pulmonary disease, unspecified: Secondary | ICD-10-CM | POA: Diagnosis present

## 2016-06-07 DIAGNOSIS — E119 Type 2 diabetes mellitus without complications: Secondary | ICD-10-CM

## 2016-06-07 DIAGNOSIS — R079 Chest pain, unspecified: Secondary | ICD-10-CM

## 2016-06-07 DIAGNOSIS — E1122 Type 2 diabetes mellitus with diabetic chronic kidney disease: Secondary | ICD-10-CM | POA: Diagnosis present

## 2016-06-07 DIAGNOSIS — Z79899 Other long term (current) drug therapy: Secondary | ICD-10-CM

## 2016-06-07 DIAGNOSIS — Z833 Family history of diabetes mellitus: Secondary | ICD-10-CM

## 2016-06-07 DIAGNOSIS — M549 Dorsalgia, unspecified: Secondary | ICD-10-CM

## 2016-06-07 DIAGNOSIS — Z8701 Personal history of pneumonia (recurrent): Secondary | ICD-10-CM

## 2016-06-07 DIAGNOSIS — G4733 Obstructive sleep apnea (adult) (pediatric): Secondary | ICD-10-CM | POA: Diagnosis present

## 2016-06-07 DIAGNOSIS — I10 Essential (primary) hypertension: Secondary | ICD-10-CM | POA: Diagnosis present

## 2016-06-07 DIAGNOSIS — R42 Dizziness and giddiness: Secondary | ICD-10-CM

## 2016-06-07 DIAGNOSIS — Z885 Allergy status to narcotic agent status: Secondary | ICD-10-CM

## 2016-06-07 DIAGNOSIS — R0602 Shortness of breath: Secondary | ICD-10-CM | POA: Diagnosis not present

## 2016-06-07 DIAGNOSIS — F17210 Nicotine dependence, cigarettes, uncomplicated: Secondary | ICD-10-CM | POA: Diagnosis present

## 2016-06-07 DIAGNOSIS — K219 Gastro-esophageal reflux disease without esophagitis: Secondary | ICD-10-CM | POA: Diagnosis present

## 2016-06-07 DIAGNOSIS — Z882 Allergy status to sulfonamides status: Secondary | ICD-10-CM

## 2016-06-07 DIAGNOSIS — Z888 Allergy status to other drugs, medicaments and biological substances status: Secondary | ICD-10-CM

## 2016-06-07 DIAGNOSIS — Z823 Family history of stroke: Secondary | ICD-10-CM

## 2016-06-07 DIAGNOSIS — Z7984 Long term (current) use of oral hypoglycemic drugs: Secondary | ICD-10-CM

## 2016-06-07 DIAGNOSIS — G8929 Other chronic pain: Secondary | ICD-10-CM | POA: Diagnosis present

## 2016-06-07 DIAGNOSIS — Z8249 Family history of ischemic heart disease and other diseases of the circulatory system: Secondary | ICD-10-CM

## 2016-06-07 DIAGNOSIS — Z90710 Acquired absence of both cervix and uterus: Secondary | ICD-10-CM

## 2016-06-07 DIAGNOSIS — Z9049 Acquired absence of other specified parts of digestive tract: Secondary | ICD-10-CM

## 2016-06-07 DIAGNOSIS — Z86010 Personal history of colonic polyps: Secondary | ICD-10-CM

## 2016-06-07 DIAGNOSIS — Z881 Allergy status to other antibiotic agents status: Secondary | ICD-10-CM

## 2016-06-07 DIAGNOSIS — Z9102 Food additives allergy status: Secondary | ICD-10-CM

## 2016-06-07 LAB — BASIC METABOLIC PANEL
ANION GAP: 8 (ref 5–15)
BUN: 19 mg/dL (ref 6–20)
CHLORIDE: 105 mmol/L (ref 101–111)
CO2: 25 mmol/L (ref 22–32)
Calcium: 9.1 mg/dL (ref 8.9–10.3)
Creatinine, Ser: 1.14 mg/dL — ABNORMAL HIGH (ref 0.44–1.00)
GFR calc non Af Amer: 48 mL/min — ABNORMAL LOW (ref >60)
GFR, EST AFRICAN AMERICAN: 56 mL/min — AB (ref >60)
Glucose, Bld: 99 mg/dL (ref 65–99)
POTASSIUM: 3.6 mmol/L (ref 3.5–5.1)
SODIUM: 138 mmol/L (ref 135–145)

## 2016-06-07 LAB — I-STAT TROPONIN, ED: Troponin i, poc: 0 ng/mL (ref 0.00–0.08)

## 2016-06-07 LAB — CBC
HEMATOCRIT: 47.4 % — AB (ref 36.0–46.0)
HEMOGLOBIN: 16 g/dL — AB (ref 12.0–15.0)
MCH: 29 pg (ref 26.0–34.0)
MCHC: 33.8 g/dL (ref 30.0–36.0)
MCV: 86 fL (ref 78.0–100.0)
Platelets: 195 10*3/uL (ref 150–400)
RBC: 5.51 MIL/uL — AB (ref 3.87–5.11)
RDW: 13.7 % (ref 11.5–15.5)
WBC: 9.8 10*3/uL (ref 4.0–10.5)

## 2016-06-07 LAB — URINALYSIS, ROUTINE W REFLEX MICROSCOPIC
Bilirubin Urine: NEGATIVE
Glucose, UA: NEGATIVE mg/dL
Hgb urine dipstick: NEGATIVE
Ketones, ur: NEGATIVE mg/dL
Leukocytes, UA: NEGATIVE
NITRITE: NEGATIVE
PROTEIN: NEGATIVE mg/dL
SPECIFIC GRAVITY, URINE: 1.01 (ref 1.005–1.030)
pH: 5 (ref 5.0–8.0)

## 2016-06-07 MED ORDER — SODIUM CHLORIDE 0.9 % IV SOLN
INTRAVENOUS | Status: DC
  Administered 2016-06-07: 21:00:00 via INTRAVENOUS

## 2016-06-07 MED ORDER — MORPHINE SULFATE (PF) 4 MG/ML IV SOLN
4.0000 mg | Freq: Once | INTRAVENOUS | Status: AC
  Administered 2016-06-07: 4 mg via INTRAVENOUS
  Filled 2016-06-07: qty 1

## 2016-06-07 MED ORDER — ONDANSETRON HCL 4 MG/2ML IJ SOLN
4.0000 mg | Freq: Once | INTRAMUSCULAR | Status: AC
  Administered 2016-06-07: 4 mg via INTRAVENOUS
  Filled 2016-06-07: qty 2

## 2016-06-07 MED ORDER — MECLIZINE HCL 25 MG PO TABS
25.0000 mg | ORAL_TABLET | Freq: Once | ORAL | Status: AC
  Administered 2016-06-07: 25 mg via ORAL
  Filled 2016-06-07: qty 1

## 2016-06-07 NOTE — ED Notes (Signed)
Spoke with pt's daughter about status of patient.  Pt verbalized consent to give that information.

## 2016-06-07 NOTE — ED Notes (Signed)
Pt states she does not take insulin for her diabetes and cannot be on a diabetic diet because "she is an anti-body donor".

## 2016-06-07 NOTE — ED Notes (Signed)
Patient transported to CT 

## 2016-06-07 NOTE — ED Provider Notes (Signed)
[facility] DEPT Provider Note   CSN: [id number] Arrival date & time: 06/07/16  1818   By signing my name below, I, Royce Macadamia, attest that this documentation has been prepared under the direction and in the presence of [employer], PA-C. Electronically Signed: Royce Macadamia, ED Scribe. 06/07/16. 8:56 PM.  History   Chief Complaint Chief Complaint  Patient presents with  . Chest Pain  . Back Pain   The history is provided by the patient. No language interpreter was used.    HPI Comments:  Victoria Bird is a 70 y.o. female with a history of COPD, HTN AAA and type 2 diabetes who presents to the Emergency Department complaining of waxing and waning right sided lower back pain beginning on 05/02/16.  By 05/04/16 the pain had radiated to her right shoulder blade.  She states the pain becomes sharp for a few minutes then returns to a consistent ache.  Last night or this morning she began having intermittent bouts of chest pain that starts suddenly and slowly recedes.  Her most recent bout of chest pain was in the emergency department.  She also notes vertigo and associated nausea and states it is worse when she lies on her left side.  Movement causes the feeling of dizziness and room spinning.  Nothing exacerbates the pain.  She's taken nothing for her pain.   She is a rare antibody donor and her last donation was in March of this year.  She denies fever, chills, swelling in her feet or legs, SOB and abnormal sweating.    Past Medical History:  Diagnosis Date  . Anxiety   . Chronic pain   . Colon polyps    adenomatous  . COPD (chronic obstructive pulmonary disease) ([place])   . Diabetes mellitus   . Diverticulitis   . Diverticulosis   . Emphysema lung ([place])   . Gall stones   . GERD (gastroesophageal reflux disease)   . H/O: pneumonia   . High cholesterol   . Hypertension   . OSA (obstructive sleep apnea)   . Tracheobronchitis 01/01/2012    Patient Active  Problem List   Diagnosis Date Noted  . Vertigo 06/08/2016  . COPD with exacerbation ([place]) 03/10/2016  . Abdominal pain 02/25/2016  . Adjustment disorder with mixed anxiety and depressed mood 02/25/2016  . Overweight (BMI 25.0-29.9) 01/25/2016  . Allergic rhinitis 11/17/2015  . Tobacco use disorder 06/22/2015  . Nausea and vomiting 06/16/2015  . Diabetes mellitus type 2, controlled ([place]) 06/16/2015  . H/O recurrent pneumonia 05/27/2015  . Chronic respiratory failure ([place]) 05/27/2015  . GERD (gastroesophageal reflux disease) 05/27/2015  . Hyperlipidemia 05/27/2015  . Chest pain 05/27/2015  . Chronic pain syndrome 05/27/2015  . Thyroid nodule 01/20/2015  . COPD (chronic obstructive pulmonary disease) ([place]) 10/09/2014  . Diabetes mellitus ([place]) 12/14/2011  . Hypertension 12/14/2011  . OSA (obstructive sleep apnea) 10/02/2008    Past Surgical History:  Procedure Laterality Date  . ABDOMINAL HYSTERECTOMY    . APPENDECTOMY    . Bilateral shoulder surgery    . CARPAL TUNNEL RELEASE Left   . CHOLECYSTECTOMY    . COLONOSCOPY    . ESOPHAGOGASTRODUODENOSCOPY    . FRACTURE SURGERY    . Right knee surgery    . ROTATOR CUFF REPAIR      OB History    No data available       Home Medications    Prior to Admission medications   Medication Sig Start Date End Date  Taking? Authorizing Provider  albuterol (PROVENTIL HFA;VENTOLIN HFA) 108 (90 Base) MCG/ACT inhaler Inhale 1-2 puffs into the lungs every 4 (four) hours as needed. For shortness of breath. 01/25/16  Yes Golden Circle, FNP  ALPRAZolam Duanne Moron) 0.5 MG tablet Take 1 tablet (0.5 mg total) by mouth at bedtime as needed for anxiety. 02/25/16  Yes Hoyt Koch, MD  ipratropium (ATROVENT) 0.02 % nebulizer solution Take 0.5 mg by nebulization. As needed for SOB 03/28/16  Yes Historical Provider, MD  losartan-hydrochlorothiazide (HYZAAR) 100-12.5 MG tablet Take 1 tablet by mouth daily. 02/25/16  Yes Hoyt Koch, MD    metFORMIN (GLUCOPHAGE) 500 MG tablet TAKE 1 TABLET(500 MG) BY MOUTH TWICE DAILY WITH A MEAL 06/02/16  Yes Hoyt Koch, MD  pantoprazole (PROTONIX) 40 MG tablet Take 1 tablet (40 mg total) by mouth daily. 02/25/16  Yes Hoyt Koch, MD  tiotropium (SPIRIVA) 18 MCG inhalation capsule Place 1 capsule (18 mcg total) into inhaler and inhale daily. 12/18/15  Yes Kelvin Cellar, MD    Family History Family History  Problem Relation Age of Onset  . Heart attack Mother   . Diabetes Maternal Grandfather   . Heart attack Maternal Grandfather   . Hypertension Maternal Grandfather   . Stroke Maternal Grandfather   . Heart attack Maternal Grandmother   . Hypertension Maternal Grandmother   . Alcohol abuse Father   . Thyroid disease Neg Hx   . Lung disease Neg Hx   . Colon cancer Neg Hx     Social History Social History  Substance Use Topics  . Smoking status: Current Every Day Smoker    Packs/day: 0.50    Years: 41.00    Types: Cigarettes    Start date: 03/19/1974  . Smokeless tobacco: Never Used     Comment: Peak rate of 1.5ppd, tobacco infor given  . Alcohol use No     Allergies   Aspartame and phenylalanine; Doxycycline; Tramadol; Codeine; Neurontin [gabapentin]; Sulfa antibiotics; Penicillins; and Prednisone   Review of Systems Review of Systems  Constitutional: Negative for chills, diaphoresis and fever.  Respiratory: Negative for shortness of breath.   Cardiovascular: Positive for chest pain.  Gastrointestinal: Positive for nausea.  Musculoskeletal: Positive for back pain and myalgias.  Neurological: Positive for dizziness.  All other systems reviewed and are negative.    Physical Exam Updated Vital Signs BP 147/93 (BP Location: Left Arm)   Pulse 84   Temp 97.7 F (36.5 C)   Resp 18   SpO2 92%   Physical Exam  Constitutional: She appears well-developed and well-nourished. No distress.  Awake, alert, nontoxic appearance  HENT:  Head: Normocephalic  and atraumatic.  Mouth/Throat: Oropharynx is clear and moist. No oropharyngeal exudate.  Eyes: Conjunctivae and EOM are normal. Pupils are equal, round, and reactive to light. No scleral icterus.  No nystagmus  Neck: Normal range of motion. Neck supple.  Full ROM without pain  Cardiovascular: Normal rate, regular rhythm and intact distal pulses.   Pulmonary/Chest: No accessory muscle usage. No respiratory distress. She has no decreased breath sounds. She has no wheezes.  Equal chest expansion Coarse breath sound throughout without focal sounds.  Abdominal: Soft. Bowel sounds are normal. She exhibits no distension and no mass. There is no tenderness. There is no rebound and no guarding.  Musculoskeletal: Normal range of motion. She exhibits no edema.  Full range of motion of the T-spine and L-spine No midline tenderness to the  T-spine or L-spine No tenderness to  palpation of the paraspinous muscles of the L-spine Tenderness to palpation over the right flank and paraspinal region of the T-spine   Lymphadenopathy:    She has no cervical adenopathy.  Neurological: She is alert. She has normal reflexes.  Reflex Scores:      Bicep reflexes are 2+ on the right side and 2+ on the left side.      Brachioradialis reflexes are 2+ on the right side and 2+ on the left side.      Patellar reflexes are 2+ on the right side and 2+ on the left side.      Achilles reflexes are 2+ on the right side and 2+ on the left side. Mental Status:  Alert, oriented, thought content appropriate, able to give a coherent history. Speech fluent without evidence of aphasia. Able to follow 2 step commands without difficulty.  Cranial Nerves:  II:  Peripheral visual fields grossly normal, pupils equal, round, reactive to light III,IV, VI: ptosis not present, extra-ocular motions intact bilaterally  V,VII: smile symmetric, facial light touch sensation equal VIII: hearing grossly normal to voice  X: uvula elevates  symmetrically  XI: bilateral shoulder shrug symmetric and strong XII: midline tongue extension without fassiculations Motor:  Normal tone. 5/5 in upper and lower extremities bilaterally including strong and equal grip strength and dorsiflexion/plantar flexion Sensory: Pinprick and light touch normal in all extremities.  Cerebellar: normal finger-to-nose with bilateral upper extremities Gait: gait testing deferred as pt reports she is "too dizzy" CV: distal pulses palpable throughout   Skin: Skin is warm and dry. No rash noted. She is not diaphoretic. No erythema.  Psychiatric: She has a normal mood and affect. Her behavior is normal.  Nursing note and vitals reviewed.    ED Treatments / Results   DIAGNOSTIC STUDIES:  Oxygen Saturation is 92% on RA, low by my interpretation.    COORDINATION OF CARE:  8:56 PM Discussed treatment plan with pt at bedside and pt agreed to plan.  Labs (all labs ordered are listed, but only abnormal results are displayed) Labs Reviewed  BASIC METABOLIC PANEL - Abnormal; Notable for the following:       Result Value   Creatinine, Ser 1.14 (*)    GFR calc non Af Amer 48 (*)    GFR calc Af Amer 56 (*)    All other components within normal limits  CBC - Abnormal; Notable for the following:    RBC 5.51 (*)    Hemoglobin 16.0 (*)    HCT 47.4 (*)    All other components within normal limits  URINALYSIS, ROUTINE W REFLEX MICROSCOPIC (NOT AT [facility])  Randolm Idol, ED    EKG  EKG Interpretation  Date/Time:  Wednesday June 07 2016 18:33:21 EDT Ventricular Rate:  85 PR Interval:    QRS Duration: 73 QT Interval:  374 QTC Calculation: 445 R Axis:   40 Text Interpretation:  Sinus rhythm Confirmed by Alvino Chapel  MD, Ovid Curd [phone]) on 06/07/2016 9:04:14 PM       Radiology Dg Chest 2 View  Result Date: 06/07/2016 CLINICAL DATA:  RIGHT side chest pain radiating the back for 1-2 days with intermittent shortness of breath; history COPD, type II  diabetes mellitus, pneumonia, hypertension, GERD EXAM: CHEST  2 VIEW COMPARISON:  03/16/2016 FINDINGS: Normal heart size, mediastinal contours, and pulmonary vascularity. Bronchitic changes with minimal scarring at RIGHT base. No pulmonary infiltrate, pleural effusion or pneumothorax. Bones demineralized. IMPRESSION: Bronchitic changes with RIGHT basilar scarring. No acute infiltrate. Electronically  Signed   By: Lavonia Dana M.D.   On: 06/07/2016 18:55   Ct Renal Stone Study  Result Date: 06/07/2016 CLINICAL DATA:  Acute onset of central chest pain and back pain. Initial encounter. EXAM: CT ABDOMEN AND PELVIS WITHOUT CONTRAST TECHNIQUE: Multidetector CT imaging of the abdomen and pelvis was performed following the standard protocol without IV contrast. COMPARISON:  CT of the abdomen and pelvis performed 12/26/2015 FINDINGS: Lower chest: Minimal bibasilar atelectasis or scarring is noted. The visualized portions of the mediastinum are grossly unremarkable. Hepatobiliary: A tiny calcified granuloma is noted within the medial right hepatic lobe. The patient is status post cholecystectomy, with clips noted at the gallbladder fossa. The common bile duct is prominent, measuring 2.3 cm. Pancreas: The pancreas is unremarkable in appearance. Spleen: The spleen is unremarkable in appearance. Adrenals/Urinary Tract: The adrenal glands are unremarkable in appearance. The kidneys are grossly unremarkable. No perinephric stranding is seen. No renal or ureteral stones are identified. There is no evidence of hydronephrosis. Stomach/Bowel: The stomach is unremarkable in appearance. Small bowel loops are unremarkable in appearance. The patient is status post appendectomy. Scattered diverticulosis is noted along the distal descending and sigmoid colon, without evidence of diverticulitis. Vascular/Lymphatic: There is mild focal dilatation of the distal descending thoracic aorta to 3.6 cm, which resolves at the level of the proximal  abdominal aorta. Underlying intramural thrombus is noted, without significant luminal narrowing, though this is difficult to fully assess without contrast. There is also dilatation of the infrarenal abdominal aorta to 3.8 cm in AP dimension, with associated intramural thrombus. This resolves proximal to the aortic bifurcation. Scattered calcification is noted along the abdominal aorta and its branches. No retroperitoneal lymphadenopathy is seen. No pelvic sidewall lymphadenopathy is appreciated. Reproductive: The bladder is moderately distended and grossly unremarkable. The patient is status post hysterectomy. No suspicious adnexal masses are seen. Other: No significant soft tissue abnormalities are characterized. Musculoskeletal: No acute osseous abnormalities are seen. IMPRESSION: 1. No acute abnormality seen to explain the patient's symptoms. 2. **An incidental finding of potential clinical significance has been found. Dilatation of the infrarenal abdominal aorta to 3.8 cm in AP dimension, with associated intramural thrombus. This resolves proximal to the aortic bifurcation. Scattered calcification along the abdominal aorta and its branches. Recommend followup by ultrasound in 2 years. This recommendation follows ACR consensus guidelines: White Paper of the ACR Incidental Findings Committee II on Vascular Findings. J Am Coll Radiol 2013; 10:789-794.** 3. Mild focal dilatation of the distal descending thoracic aorta to 3.6 cm, which resolves at the level of the proximal abdominal aorta. Underlying intramural thrombus, without significant luminal narrowing. 4. Increased prominence of the common bile duct, measuring 2.3 cm. 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 5. Scattered diverticulosis along the distal descending and sigmoid colon, without evidence of diverticulitis. Electronically Signed   By: Garald Balding M.D.   On: 06/07/2016 23:54    Procedures Procedures (including  critical care time)  Medications Ordered in ED Medications  0.9 %  sodium chloride infusion ( Intravenous New Bag/Given 06/07/16 2117)  ondansetron (ZOFRAN) injection 4 mg (4 mg Intravenous Given 06/07/16 2118)  morphine 4 MG/ML injection 4 mg (4 mg Intravenous Given 06/07/16 2117)  meclizine (ANTIVERT) tablet 25 mg (25 mg Oral Given 06/07/16 2153)     Initial Impression / Assessment and Plan / ED Course  I have reviewed the triage vital signs and the nursing notes.  Pertinent labs & imaging results that were available during my care of  the patient were reviewed by me and considered in my medical decision making (see chart for details).  Clinical Course  Value Comment By Time  Creatinine: (!) 1.14 Serum creatinine at baseline [facility], PA-C 09/06 2027  Troponin i, poc: 0.00 Neg Abigail Butts, PA-C 09/06 2027  Hemoglobin: (!) 16.0 Elevated; appears hemoconcentrated Abigail Butts, PA-C 09/06 2028  DG Chest 2 View Bronchitic changes with no infiltrate Abigail Butts, PA-C 09/06 2028  EKG 12-Lead NSR without ischemic changes Abigail Butts, PA-C 09/06 2029   06/23/15: Stress test Nuclear stress EF: 70%. The study is normal. No evidence of ischemia. Normal LV function . This is a low risk study. Jarrett Soho Rock Sobol, PA-C 09/06 2035   06/17/15: Echo - Left ventricle: The cavity size was normal. Wall thickness was increased in a pattern of mild VH. Systolic function was normal. The estimated ejection fraction was in the range of 60% to 65%.  Wall motion was normal; there were no regional wall motion abnormalities.  - Left atrium: The atrium was at the upper limits of normal in size. - Right atrium: The atrium was normal in size. - Inferior vena cava: The vessel was normal in size. The respirophasic diameter changes were in the normal range (>= 50%), consistent with normal central venous pressure. Jarrett Soho Shawne Bulow, PA-C 09/06 2037  BP: 147/93 VSS.  No fever,  tachycardia or hypotension Abigail Butts, PA-C 09/06 2038   The patient was discussed with and seen by Dr. Alvino Chapel who agrees with the treatment plan.  He agrees that this appears to be peripheral vertigo and does not feel the need for CT/MRI at this time as it does not clinically appear to be a central vertigo.    Jarrett Soho Lindamarie Maclachlan, PA-C 09/06 2355   Pt reports dizziness continues to return with movement even after treatment.   Jarrett Soho Correll Denbow, PA-C 09/07 0205   Pt is unable to ambulate without assistance in spite of treatment due to profound dizziness with movement.  She reports she lives at home alone and has stairs.   Jarrett Soho Melizza Kanode, PA-C 09/07 0254  Leukocytes, UA: NEGATIVE No UTI Abigail Butts, PA-C 09/07 0255   Discussed with Dr. Hal Hope who will admit to tele obs Abigail Butts, PA-C 09/07 0300    Pt with Back pain that appears to be musculoskeletal. CT scan of the abdomen shows the persistence of AAA but no increase in size. No renal stones suggest renal colic.  Symptoms improved with morphine.  Patient also with intermittent chest pain and not present on my examination. Her troponin is negative. Previous cardiac workups have been reassuring. I do not believe that her chest pain is ACS.  Patient's dizziness appears to be peripheral vertigo. It is worse with movement and abates at rest. Is not persistent. Highly doubt central cause. No improvement after meclizine. Patient lives alone and is unable to care for herself but she is unable to walk unassisted in the emergency department.  Be admitted for further management.  Final Clinical Impressions(s) / ED Diagnoses   Final diagnoses:  Right-sided low back pain without sciatica  Vertigo  Chest pain, unspecified chest pain type    New Prescriptions New Prescriptions   No medications on file    I personally performed the services described in this documentation, which was scribed in my presence. The  recorded information has been reviewed and is accurate.     Jarrett Soho Keani Gotcher, PA-C 06/08/16 [id number]    Davonna Belling, MD 06/09/16 0010

## 2016-06-07 NOTE — ED Triage Notes (Signed)
Pt complains of central chest pain since waking this morning. Pt denies shortness of breath or nausea. Pt states she has also had back pain radiating to her right shoulder since Friday.

## 2016-06-08 ENCOUNTER — Encounter (HOSPITAL_COMMUNITY): Payer: Self-pay | Admitting: Internal Medicine

## 2016-06-08 ENCOUNTER — Observation Stay (HOSPITAL_COMMUNITY): Payer: Medicare Other

## 2016-06-08 DIAGNOSIS — M545 Low back pain, unspecified: Secondary | ICD-10-CM

## 2016-06-08 DIAGNOSIS — I714 Abdominal aortic aneurysm, without rupture, unspecified: Secondary | ICD-10-CM | POA: Diagnosis present

## 2016-06-08 DIAGNOSIS — R0781 Pleurodynia: Secondary | ICD-10-CM | POA: Diagnosis not present

## 2016-06-08 DIAGNOSIS — R079 Chest pain, unspecified: Secondary | ICD-10-CM | POA: Diagnosis not present

## 2016-06-08 DIAGNOSIS — R42 Dizziness and giddiness: Secondary | ICD-10-CM | POA: Diagnosis not present

## 2016-06-08 LAB — CBC WITH DIFFERENTIAL/PLATELET
BASOS PCT: 0 %
Basophils Absolute: 0 10*3/uL (ref 0.0–0.1)
EOS PCT: 3 %
Eosinophils Absolute: 0.3 10*3/uL (ref 0.0–0.7)
HEMATOCRIT: 45.5 % (ref 36.0–46.0)
Hemoglobin: 14.7 g/dL (ref 12.0–15.0)
LYMPHS PCT: 37 %
Lymphs Abs: 3.4 10*3/uL (ref 0.7–4.0)
MCH: 28.4 pg (ref 26.0–34.0)
MCHC: 32.3 g/dL (ref 30.0–36.0)
MCV: 87.8 fL (ref 78.0–100.0)
MONO ABS: 0.6 10*3/uL (ref 0.1–1.0)
MONOS PCT: 6 %
NEUTROS ABS: 4.9 10*3/uL (ref 1.7–7.7)
Neutrophils Relative %: 54 %
PLATELETS: 184 10*3/uL (ref 150–400)
RBC: 5.18 MIL/uL — ABNORMAL HIGH (ref 3.87–5.11)
RDW: 13.8 % (ref 11.5–15.5)
WBC: 9.2 10*3/uL (ref 4.0–10.5)

## 2016-06-08 LAB — COMPREHENSIVE METABOLIC PANEL
ALBUMIN: 3.6 g/dL (ref 3.5–5.0)
ALK PHOS: 62 U/L (ref 38–126)
ALT: 17 U/L (ref 14–54)
ANION GAP: 6 (ref 5–15)
AST: 15 U/L (ref 15–41)
BILIRUBIN TOTAL: 0.8 mg/dL (ref 0.3–1.2)
BUN: 19 mg/dL (ref 6–20)
CO2: 29 mmol/L (ref 22–32)
Calcium: 8.5 mg/dL — ABNORMAL LOW (ref 8.9–10.3)
Chloride: 105 mmol/L (ref 101–111)
Creatinine, Ser: 1.23 mg/dL — ABNORMAL HIGH (ref 0.44–1.00)
GFR calc Af Amer: 51 mL/min — ABNORMAL LOW (ref >60)
GFR calc non Af Amer: 44 mL/min — ABNORMAL LOW (ref >60)
GLUCOSE: 97 mg/dL (ref 65–99)
Potassium: 3.4 mmol/L — ABNORMAL LOW (ref 3.5–5.1)
SODIUM: 140 mmol/L (ref 135–145)
Total Protein: 6.5 g/dL (ref 6.5–8.1)

## 2016-06-08 MED ORDER — LOSARTAN POTASSIUM 50 MG PO TABS: 100.0000 mg | ORAL_TABLET | Freq: Every day | ORAL | Status: DC

## 2016-06-08 MED ORDER — ONDANSETRON HCL 4 MG/2ML IJ SOLN: 4.0000 mg | Freq: Three times a day (TID) | INTRAMUSCULAR | Status: DC | PRN

## 2016-06-08 MED ORDER — LINAGLIPTIN 5 MG PO TABS
5.0000 mg | ORAL_TABLET | Freq: Every day | ORAL | Status: DC
  Administered 2016-06-08 – 2016-06-11 (×4): 5 mg via ORAL
  Filled 2016-06-08 (×4): qty 1

## 2016-06-08 MED ORDER — ACETAMINOPHEN 325 MG PO TABS: 650.0000 mg | ORAL_TABLET | Freq: Four times a day (QID) | ORAL | Status: DC | PRN

## 2016-06-08 MED ORDER — POTASSIUM CHLORIDE CRYS ER 20 MEQ PO TBCR
40.0000 meq | EXTENDED_RELEASE_TABLET | Freq: Once | ORAL | Status: AC
  Administered 2016-06-08: 40 meq via ORAL
  Filled 2016-06-08: qty 2

## 2016-06-08 MED ORDER — ONDANSETRON HCL 4 MG PO TABS: 4.0000 mg | ORAL_TABLET | Freq: Four times a day (QID) | ORAL | Status: DC | PRN

## 2016-06-08 MED ORDER — HYDRALAZINE HCL 20 MG/ML IJ SOLN
10.0000 mg | INTRAMUSCULAR | Status: DC | PRN
  Administered 2016-06-10: 10 mg via INTRAVENOUS
  Filled 2016-06-08: qty 1

## 2016-06-08 MED ORDER — ALPRAZOLAM 0.5 MG PO TABS
0.5000 mg | ORAL_TABLET | Freq: Every evening | ORAL | Status: DC | PRN
  Administered 2016-06-10: 0.5 mg via ORAL
  Filled 2016-06-08: qty 1

## 2016-06-08 MED ORDER — ENOXAPARIN SODIUM 40 MG/0.4ML ~~LOC~~ SOLN
40.0000 mg | SUBCUTANEOUS | Status: DC
  Administered 2016-06-08 – 2016-06-11 (×4): 40 mg via SUBCUTANEOUS
  Filled 2016-06-08 (×4): qty 0.4

## 2016-06-08 MED ORDER — PANTOPRAZOLE SODIUM 40 MG PO TBEC
40.0000 mg | DELAYED_RELEASE_TABLET | Freq: Every day | ORAL | Status: DC
  Administered 2016-06-08 – 2016-06-11 (×4): 40 mg via ORAL
  Filled 2016-06-08 (×4): qty 1

## 2016-06-08 MED ORDER — ALBUTEROL SULFATE (2.5 MG/3ML) 0.083% IN NEBU: 3.0000 mL | INHALATION_SOLUTION | RESPIRATORY_TRACT | Status: DC | PRN

## 2016-06-08 MED ORDER — ONDANSETRON HCL 4 MG/2ML IJ SOLN: 4.0000 mg | Freq: Four times a day (QID) | INTRAMUSCULAR | Status: DC | PRN

## 2016-06-08 MED ORDER — ACETAMINOPHEN 650 MG RE SUPP: 650.0000 mg | Freq: Four times a day (QID) | RECTAL | Status: DC | PRN

## 2016-06-08 MED ORDER — ORAL CARE MOUTH RINSE
15.0000 mL | Freq: Two times a day (BID) | OROMUCOSAL | Status: DC
  Administered 2016-06-08 – 2016-06-11 (×7): 15 mL via OROMUCOSAL

## 2016-06-08 MED ORDER — METHOCARBAMOL 500 MG PO TABS
500.0000 mg | ORAL_TABLET | Freq: Three times a day (TID) | ORAL | Status: DC
  Administered 2016-06-08 – 2016-06-11 (×10): 500 mg via ORAL
  Filled 2016-06-08 (×10): qty 1

## 2016-06-08 MED ORDER — MORPHINE SULFATE (PF) 2 MG/ML IV SOLN
1.0000 mg | INTRAVENOUS | Status: DC | PRN
  Administered 2016-06-08 – 2016-06-10 (×11): 1 mg via INTRAVENOUS
  Filled 2016-06-08 (×12): qty 1

## 2016-06-08 MED ORDER — IOPAMIDOL (ISOVUE-370) INJECTION 76%
100.0000 mL | Freq: Once | INTRAVENOUS | Status: AC | PRN
  Administered 2016-06-08: 80 mL via INTRAVENOUS

## 2016-06-08 MED ORDER — TIOTROPIUM BROMIDE MONOHYDRATE 18 MCG IN CAPS
18.0000 ug | ORAL_CAPSULE | Freq: Every day | RESPIRATORY_TRACT | Status: DC
  Administered 2016-06-08 – 2016-06-11 (×4): 18 ug via RESPIRATORY_TRACT
  Filled 2016-06-08: qty 5

## 2016-06-08 MED ORDER — SODIUM CHLORIDE 0.9 % IV SOLN
INTRAVENOUS | Status: AC
  Administered 2016-06-08 (×3): via INTRAVENOUS

## 2016-06-08 NOTE — ED Notes (Signed)
Patient transported to CT 

## 2016-06-08 NOTE — Progress Notes (Signed)
Patient admitted after midnight, please see H&P.  CT scan negative for PE Patient's main complaint is right sided pain-- from scapula to low back.  No rash seen.  Will as robaxin as she describes it as a "grabbing/catching pain"..  Added heat as well.  CT scan for renal stones negative as well.  PT eval pending.    Eulogio Bear

## 2016-06-08 NOTE — ED Notes (Signed)
Pt ambulated about 60ft, very slow and complained of being dizzy

## 2016-06-08 NOTE — ED Notes (Signed)
Daughter's contact information  Benjamine Mola [phone]

## 2016-06-08 NOTE — H&P (Signed)
History and Physical    Victoria Bird [id number] DOB: July 06, 1946 DOA: 06/07/2016  PCP: Hoyt Koch, MD  Patient coming from: Home.  Chief Complaint: Right-sided pleuritic chest pain and dizziness.  HPI: Victoria Bird is a 70 y.o. female with COPD, diabetes mellitus type 2, hypertension presents to the ER because of right-sided pleuritic chest pain. Patient has been having this pain for last 4 days. Denies any fall or trauma. In addition patient also has been having dizziness on moving. Patient's chest pain is only on deep inspiration and movements. Denies any productive cough fever or chills. CT of the abdomen shows abdominal attic aneurysm 3.8 cm with no change in size compared to CT done in March 2017. There is also increased dilatation of the CBD. 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 On exam patient does not have any rash on the skin at the site of the pain. Patient has significant pain on minimal movement. Patient states she gets dizzy on standing. Patient was not orthostatic. Patient is being admitted for right-sided pleuritic chest pain and dizziness. Chest x-ray shows a right basilar scarring and bronchitic changes.  ED Course: See history of presenting illness.  Review of Systems: As per HPI, rest all negative.   Past Medical History:  Diagnosis Date  . Anxiety   . Chronic pain   . Colon polyps    adenomatous  . COPD (chronic obstructive pulmonary disease) ([place])   . Diabetes mellitus   . Diverticulitis   . Diverticulosis   . Emphysema lung ([place])   . Gall stones   . GERD (gastroesophageal reflux disease)   . H/O: pneumonia   . High cholesterol   . Hypertension   . OSA (obstructive sleep apnea)   . Tracheobronchitis 01/01/2012    Past Surgical History:  Procedure Laterality Date  . ABDOMINAL HYSTERECTOMY    . APPENDECTOMY    . Bilateral shoulder surgery    . CARPAL TUNNEL RELEASE Left   . CHOLECYSTECTOMY    . COLONOSCOPY    .  ESOPHAGOGASTRODUODENOSCOPY    . FRACTURE SURGERY    . Right knee surgery    . ROTATOR CUFF REPAIR       reports that she has been smoking Cigarettes.  She started smoking about 42 years ago. She has a 20.50 pack-year smoking history. She has never used smokeless tobacco. She reports that she does not drink alcohol or use drugs.  Allergies  Allergen Reactions  . Aspartame And Phenylalanine Nausea And Vomiting    Patient says she is allergic to all artificial sweeteners  . Doxycycline Other (See Comments)    Nausea, vomiting, HA, double vision  . Tramadol Shortness Of Breath and Nausea Only  . Codeine Itching    Has not tried benadryl to alleviate side effects  . Neurontin [Gabapentin] Other (See Comments)    Dizzy, "drugged" feeling, sleepy  . Sulfa Antibiotics Other (See Comments)    Kidney problem   . Penicillins Rash    Has patient had a PCN reaction causing immediate rash, facial/tongue/throat swelling, SOB or lightheadedness with hypotension: No Has patient had a PCN reaction causing severe rash involving mucus membranes or skin necrosis: No Has patient had a PCN reaction that required hospitalization No Has patient had a PCN reaction occurring within the last 10 years: No If all of the above answers are "NO", then may proceed with Cephalosporin use.  . Prednisone Rash    Patient stated she received Prednisone while she was in the  hospital and experienced a rash and "extreme pain.'    Family History  Problem Relation Age of Onset  . Heart attack Mother   . Diabetes Maternal Grandfather   . Heart attack Maternal Grandfather   . Hypertension Maternal Grandfather   . Stroke Maternal Grandfather   . Heart attack Maternal Grandmother   . Hypertension Maternal Grandmother   . Alcohol abuse Father   . Thyroid disease Neg Hx   . Lung disease Neg Hx   . Colon cancer Neg Hx     Prior to Admission medications   Medication Sig Start Date End Date Taking? Authorizing Provider    albuterol (PROVENTIL HFA;VENTOLIN HFA) 108 (90 Base) MCG/ACT inhaler Inhale 1-2 puffs into the lungs every 4 (four) hours as needed. For shortness of breath. 01/25/16  Yes Golden Circle, FNP  ALPRAZolam Duanne Moron) 0.5 MG tablet Take 1 tablet (0.5 mg total) by mouth at bedtime as needed for anxiety. 02/25/16  Yes Hoyt Koch, MD  ipratropium (ATROVENT) 0.02 % nebulizer solution Take 0.5 mg by nebulization. As needed for SOB 03/28/16  Yes Historical Provider, MD  losartan-hydrochlorothiazide (HYZAAR) 100-12.5 MG tablet Take 1 tablet by mouth daily. 02/25/16  Yes Hoyt Koch, MD  metFORMIN (GLUCOPHAGE) 500 MG tablet TAKE 1 TABLET(500 MG) BY MOUTH TWICE DAILY WITH A MEAL 06/02/16  Yes Hoyt Koch, MD  pantoprazole (PROTONIX) 40 MG tablet Take 1 tablet (40 mg total) by mouth daily. 02/25/16  Yes Hoyt Koch, MD  tiotropium (SPIRIVA) 18 MCG inhalation capsule Place 1 capsule (18 mcg total) into inhaler and inhale daily. 12/18/15  Yes Kelvin Cellar, MD    Physical Exam: Vitals:   06/07/16 2215 06/07/16 2300 06/08/16 0034 06/08/16 0359  BP: 130/70 121/65 136/63 (!) 137/57  Pulse: 66 70 72 64  Resp: 18 15 18  (!) 22  Temp:   98 F (36.7 C) 97.8 F (36.6 C)  TempSrc:   Oral Oral  SpO2: 96% 98% 98% 99%  Weight:    163 lb 5.8 oz (74.1 kg)  Height:    5\' 3"  (1.6 m)      Constitutional: Not in distress. Vitals:   06/07/16 2215 06/07/16 2300 06/08/16 0034 06/08/16 0359  BP: 130/70 121/65 136/63 (!) 137/57  Pulse: 66 70 72 64  Resp: 18 15 18  (!) 22  Temp:   98 F (36.7 C) 97.8 F (36.6 C)  TempSrc:   Oral Oral  SpO2: 96% 98% 98% 99%  Weight:    163 lb 5.8 oz (74.1 kg)  Height:    5\' 3"  (1.6 m)   Eyes: Anicteric no pallor. ENMT: No discharge from the ears eyes nose or mouth. Neck: No mass felt. Respiratory: No rhonchi or crepitations. Cardiovascular: S1 and S2 heard. Abdomen: Soft nontender bowel sounds present. No guarding or rigidity. Musculoskeletal: No  edema. Skin: No rash. Neurologic: Alert awake oriented to time place and person. Moves all extremities. Psychiatric: Appears normal.   Labs on Admission: I have personally reviewed following labs and imaging studies  CBC:  Recent Labs Lab 06/07/16 1918  WBC 9.8  HGB 16.0*  HCT 47.4*  MCV 86.0  PLT 0000000   Basic Metabolic Panel:  Recent Labs Lab 06/07/16 1918  NA 138  K 3.6  CL 105  CO2 25  GLUCOSE 99  BUN 19  CREATININE 1.14*  CALCIUM 9.1   GFR: Estimated Creatinine Clearance: 44.9 mL/min (by C-G formula based on SCr of 1.14 mg/dL). Liver Function Tests: No  results for input(s): AST, ALT, ALKPHOS, BILITOT, PROT, ALBUMIN in the last 168 hours. No results for input(s): LIPASE, AMYLASE in the last 168 hours. No results for input(s): AMMONIA in the last 168 hours. Coagulation Profile: No results for input(s): INR, PROTIME in the last 168 hours. Cardiac Enzymes: No results for input(s): CKTOTAL, CKMB, CKMBINDEX, TROPONINI in the last 168 hours. BNP (last 3 results) No results for input(s): PROBNP in the last 8760 hours. HbA1C: No results for input(s): HGBA1C in the last 72 hours. CBG: No results for input(s): GLUCAP in the last 168 hours. Lipid Profile: No results for input(s): CHOL, HDL, LDLCALC, TRIG, CHOLHDL, LDLDIRECT in the last 72 hours. Thyroid Function Tests: No results for input(s): TSH, T4TOTAL, FREET4, T3FREE, THYROIDAB in the last 72 hours. Anemia Panel: No results for input(s): VITAMINB12, FOLATE, FERRITIN, TIBC, IRON, RETICCTPCT in the last 72 hours. Urine analysis:    Component Value Date/Time   COLORURINE YELLOW 06/07/2016 2030   APPEARANCEUR CLEAR 06/07/2016 2030   LABSPEC 1.010 06/07/2016 2030   [place] 5.0 06/07/2016 2030   GLUCOSEU NEGATIVE 06/07/2016 2030   [place] NEGATIVE 06/07/2016 2030   [place] NEGATIVE 06/07/2016 2030   [place] 06/07/2016 2030   PROTEINUR NEGATIVE 06/07/2016 2030   UROBILINOGEN 1.0 06/16/2015 1545    NITRITE NEGATIVE 06/07/2016 2030   LEUKOCYTESUR NEGATIVE 06/07/2016 2030   Sepsis Labs: @LABRCNTIP (procalcitonin:4,lacticidven:4) )No results found for this or any previous visit (from the past 240 hour(s)).   Radiological Exams on Admission: Dg Chest 2 View  Result Date: 06/07/2016 CLINICAL DATA:  RIGHT side chest pain radiating the back for 1-2 days with intermittent shortness of breath; history COPD, type II diabetes mellitus, pneumonia, hypertension, GERD EXAM: CHEST  2 VIEW COMPARISON:  03/16/2016 FINDINGS: Normal heart size, mediastinal contours, and pulmonary vascularity. Bronchitic changes with minimal scarring at RIGHT base. No pulmonary infiltrate, pleural effusion or pneumothorax. Bones demineralized. IMPRESSION: Bronchitic changes with RIGHT basilar scarring. No acute infiltrate. Electronically Signed   By: Lavonia Dana M.D.   On: 06/07/2016 18:55   Ct Head Wo Contrast  Result Date: 06/08/2016 CLINICAL DATA:  Acute onset of dizziness.  Initial encounter. EXAM: CT HEAD WITHOUT CONTRAST TECHNIQUE: Contiguous axial images were obtained from the base of the skull through the vertex without intravenous contrast. COMPARISON:  CT of the head performed 06/13/2014, and MRI of the brain performed 06/16/2015 FINDINGS: Brain: No evidence of acute infarction, hemorrhage, hydrocephalus, extra-axial collection or mass lesion/mass effect. Mild periventricular and subcortical white matter change likely reflects small vessel ischemic microangiopathy. The brainstem and fourth ventricle are within normal limits. The basal ganglia are unremarkable in appearance. The cerebral hemispheres demonstrate grossly normal gray-white differentiation. No mass effect or midline shift is seen. Vascular: No hyperdense vessel or unexpected calcification. Skull: There is no evidence of fracture; visualized osseous structures are unremarkable in appearance. Sinuses/Orbits: The orbits are within normal limits. The paranasal sinuses  and mastoid air cells are well-aerated. Other: No significant soft tissue abnormalities are seen. IMPRESSION: 1. No acute intracranial pathology seen on CT. 2. Mild small vessel ischemic microangiopathy. Electronically Signed   By: Garald Balding M.D.   On: 06/08/2016 03:45   Ct Renal Stone Study  Result Date: 06/07/2016 CLINICAL DATA:  Acute onset of central chest pain and back pain. Initial encounter. EXAM: CT ABDOMEN AND PELVIS WITHOUT CONTRAST TECHNIQUE: Multidetector CT imaging of the abdomen and pelvis was performed following the standard protocol without IV contrast. COMPARISON:  CT of the abdomen and pelvis performed  12/26/2015 FINDINGS: Lower chest: Minimal bibasilar atelectasis or scarring is noted. The visualized portions of the mediastinum are grossly unremarkable. Hepatobiliary: A tiny calcified granuloma is noted within the medial right hepatic lobe. The patient is status post cholecystectomy, with clips noted at the gallbladder fossa. The common bile duct is prominent, measuring 2.3 cm. Pancreas: The pancreas is unremarkable in appearance. Spleen: The spleen is unremarkable in appearance. Adrenals/Urinary Tract: The adrenal glands are unremarkable in appearance. The kidneys are grossly unremarkable. No perinephric stranding is seen. No renal or ureteral stones are identified. There is no evidence of hydronephrosis. Stomach/Bowel: The stomach is unremarkable in appearance. Small bowel loops are unremarkable in appearance. The patient is status post appendectomy. Scattered diverticulosis is noted along the distal descending and sigmoid colon, without evidence of diverticulitis. Vascular/Lymphatic: There is mild focal dilatation of the distal descending thoracic aorta to 3.6 cm, which resolves at the level of the proximal abdominal aorta. Underlying intramural thrombus is noted, without significant luminal narrowing, though this is difficult to fully assess without contrast. There is also dilatation  of the infrarenal abdominal aorta to 3.8 cm in AP dimension, with associated intramural thrombus. This resolves proximal to the aortic bifurcation. Scattered calcification is noted along the abdominal aorta and its branches. No retroperitoneal lymphadenopathy is seen. No pelvic sidewall lymphadenopathy is appreciated. Reproductive: The bladder is moderately distended and grossly unremarkable. The patient is status post hysterectomy. No suspicious adnexal masses are seen. Other: No significant soft tissue abnormalities are characterized. Musculoskeletal: No acute osseous abnormalities are seen. IMPRESSION: 1. No acute abnormality seen to explain the patient's symptoms. 2. **An incidental finding of potential clinical significance has been found. Dilatation of the infrarenal abdominal aorta to 3.8 cm in AP dimension, with associated intramural thrombus. This resolves proximal to the aortic bifurcation. Scattered calcification along the abdominal aorta and its branches. Recommend followup by ultrasound in 2 years. This recommendation follows ACR consensus guidelines: White Paper of the ACR Incidental Findings Committee II on Vascular Findings. J Am Coll Radiol 2013; 10:789-794.** 3. Mild focal dilatation of the distal descending thoracic aorta to 3.6 cm, which resolves at the level of the proximal abdominal aorta. Underlying intramural thrombus, without significant luminal narrowing. 4. Increased prominence of the common bile duct, measuring 2.3 cm. 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 5. Scattered diverticulosis along the distal descending and sigmoid colon, without evidence of diverticulitis. Electronically Signed   By: Garald Balding M.D.   On: 06/07/2016 23:54    EKG: Independently reviewed. Normal sinus rhythm.  Assessment/Plan Principal Problem:   Pleuritic chest pain Active Problems:   Hypertension   COPD (chronic obstructive pulmonary disease) ([name])   OSA (obstructive  sleep apnea)   Diabetes mellitus type 2, controlled ([name])   Vertigo   AAA (abdominal aortic aneurysm) without rupture ([place])    1. Right-sided pleuritic type of chest pain - pain increases on deep inspiration and movements. Will check CT angiogram of the chest for any lung pathology or pleural pathology. If pain persists and CT is unremarkable then may need to check for any compression fracture of the spine. 2. Dizziness - CT head is unremarkable. Probably vertigo. If persists may consider MRI brain. Get physical therapy consult. 3. Dilated CBD per CAT scan - abdomen appear benign. Follow LFTs. 4. Abdominal aortic aneurysm 3.8 cm - no change since March 2017. Need follow-up as outpatient in which patient was advised. 5. COPD - presently not wheezing. 6. Diabetes mellitus type 2 - will hold  metformin while inpatient. Patient states she does not want to be on diabetic diet or insulin coverage. While inpatient and will keep patient on [place] which patient agrees to. 7. Hypertension - since patient is receiving contrast will hold off ARB and HCTZ for now since patient has chronic kidney disease stage II. When necessary IV hydralazine for now. 8. Chronic kidney disease stage II - creatinine appears to be at baseline. See #5. 9. Tobacco abuse - strongly advised to quit smoking.  Note that patient does not want to be on any restrictive diets.   DVT prophylaxis: Lovenox. Code Status: Full code.  Family Communication: Discussed with patient.  Disposition Plan: Home.  Consults called: None.  Admission status: Observation.    [name] MD [facility] Pager [phone].  If 7PM-7AM, please contact night-coverage [email or web address] Password [email or web address]  06/08/2016, 5:00 AM

## 2016-06-08 NOTE — Care Management Obs Status (Signed)
[facility] NOTIFICATION   Patient Details  Name: Victoria Bird MRN: [id number] Date of Birth: 06/11/1946   Medicare Observation Status Notification Given:  Yes    MahabirJuliann Pulse, RN 06/08/2016, 1:58 PM

## 2016-06-08 NOTE — Progress Notes (Signed)
Clinical Social Work consult: referred for medication assistance. Deferred to Case Management at this time. Inappropriate CSW referral.  If CSW needs arise, please re-consult.  

## 2016-06-09 ENCOUNTER — Observation Stay (HOSPITAL_COMMUNITY): Payer: Medicare Other

## 2016-06-09 DIAGNOSIS — Z90710 Acquired absence of both cervix and uterus: Secondary | ICD-10-CM | POA: Diagnosis not present

## 2016-06-09 DIAGNOSIS — I714 Abdominal aortic aneurysm, without rupture: Secondary | ICD-10-CM | POA: Diagnosis present

## 2016-06-09 DIAGNOSIS — I129 Hypertensive chronic kidney disease with stage 1 through stage 4 chronic kidney disease, or unspecified chronic kidney disease: Secondary | ICD-10-CM | POA: Diagnosis present

## 2016-06-09 DIAGNOSIS — Z8249 Family history of ischemic heart disease and other diseases of the circulatory system: Secondary | ICD-10-CM | POA: Diagnosis not present

## 2016-06-09 DIAGNOSIS — E1122 Type 2 diabetes mellitus with diabetic chronic kidney disease: Secondary | ICD-10-CM | POA: Diagnosis present

## 2016-06-09 DIAGNOSIS — E119 Type 2 diabetes mellitus without complications: Secondary | ICD-10-CM | POA: Diagnosis not present

## 2016-06-09 DIAGNOSIS — K5730 Diverticulosis of large intestine without perforation or abscess without bleeding: Secondary | ICD-10-CM | POA: Diagnosis present

## 2016-06-09 DIAGNOSIS — F17210 Nicotine dependence, cigarettes, uncomplicated: Secondary | ICD-10-CM | POA: Diagnosis present

## 2016-06-09 DIAGNOSIS — F419 Anxiety disorder, unspecified: Secondary | ICD-10-CM | POA: Diagnosis present

## 2016-06-09 DIAGNOSIS — K219 Gastro-esophageal reflux disease without esophagitis: Secondary | ICD-10-CM | POA: Diagnosis present

## 2016-06-09 DIAGNOSIS — Z9049 Acquired absence of other specified parts of digestive tract: Secondary | ICD-10-CM | POA: Diagnosis not present

## 2016-06-09 DIAGNOSIS — J449 Chronic obstructive pulmonary disease, unspecified: Secondary | ICD-10-CM | POA: Diagnosis present

## 2016-06-09 DIAGNOSIS — R0602 Shortness of breath: Secondary | ICD-10-CM | POA: Diagnosis not present

## 2016-06-09 DIAGNOSIS — G4733 Obstructive sleep apnea (adult) (pediatric): Secondary | ICD-10-CM | POA: Diagnosis present

## 2016-06-09 DIAGNOSIS — Z823 Family history of stroke: Secondary | ICD-10-CM | POA: Diagnosis not present

## 2016-06-09 DIAGNOSIS — M545 Low back pain: Secondary | ICD-10-CM

## 2016-06-09 DIAGNOSIS — G8929 Other chronic pain: Secondary | ICD-10-CM | POA: Diagnosis present

## 2016-06-09 DIAGNOSIS — I10 Essential (primary) hypertension: Secondary | ICD-10-CM | POA: Diagnosis not present

## 2016-06-09 DIAGNOSIS — M5124 Other intervertebral disc displacement, thoracic region: Secondary | ICD-10-CM | POA: Diagnosis not present

## 2016-06-09 DIAGNOSIS — Z881 Allergy status to other antibiotic agents status: Secondary | ICD-10-CM | POA: Diagnosis not present

## 2016-06-09 DIAGNOSIS — R0781 Pleurodynia: Principal | ICD-10-CM

## 2016-06-09 DIAGNOSIS — N182 Chronic kidney disease, stage 2 (mild): Secondary | ICD-10-CM | POA: Diagnosis present

## 2016-06-09 DIAGNOSIS — E7800 Pure hypercholesterolemia, unspecified: Secondary | ICD-10-CM | POA: Diagnosis present

## 2016-06-09 DIAGNOSIS — J410 Simple chronic bronchitis: Secondary | ICD-10-CM

## 2016-06-09 DIAGNOSIS — R42 Dizziness and giddiness: Secondary | ICD-10-CM | POA: Diagnosis present

## 2016-06-09 DIAGNOSIS — Z8701 Personal history of pneumonia (recurrent): Secondary | ICD-10-CM | POA: Diagnosis not present

## 2016-06-09 DIAGNOSIS — Z885 Allergy status to narcotic agent status: Secondary | ICD-10-CM | POA: Diagnosis not present

## 2016-06-09 DIAGNOSIS — Z86010 Personal history of colonic polyps: Secondary | ICD-10-CM | POA: Diagnosis not present

## 2016-06-09 DIAGNOSIS — Z833 Family history of diabetes mellitus: Secondary | ICD-10-CM | POA: Diagnosis not present

## 2016-06-09 MED ORDER — IBUPROFEN 200 MG PO TABS
600.0000 mg | ORAL_TABLET | Freq: Three times a day (TID) | ORAL | Status: AC
  Administered 2016-06-09 – 2016-06-10 (×4): 600 mg via ORAL
  Filled 2016-06-09 (×4): qty 3

## 2016-06-09 MED ORDER — GADOBENATE DIMEGLUMINE 529 MG/ML IV SOLN
7.0000 mL | Freq: Once | INTRAVENOUS | Status: AC | PRN
  Administered 2016-06-09: 7 mL via INTRAVENOUS

## 2016-06-09 NOTE — Progress Notes (Signed)
[facility] PROGRESS NOTE  Victoria Bird [id number] DOB: May 11, 1946 DOA: 06/07/2016 PCP: Hoyt Koch, MD  Principal Problem:   Pleuritic chest pain Active Problems:   Hypertension   COPD (chronic obstructive pulmonary disease) ([name])   OSA (obstructive sleep apnea)   Diabetes mellitus type 2, controlled ([name])   Vertigo   AAA (abdominal aortic aneurysm) without rupture [facility])  Brief summary   70 y.o. female with COPD, diabetes mellitus type 2, hypertension presents to the ER because of right-sided pleuritic chest pain. Patient has been having this pain for last 4 days. Denies any fall or trauma. In addition patient also has been having dizziness on moving. Patient's chest pain is only on deep inspiration and movements. Denies any productive cough fever or chills. CT of the abdomen shows abdominal attic aneurysm 3.8 cm with no change in size compared to CT done in March 2017. CT chest showed no PE.  Assessment/Plan:  Right-sided shoulder/back pains. Unclear etiology. CTA chest: no PE. Patient reports radiating pain to her right shoulder from her T spine  -we will obtain T spine MRI due to uncontrolled pain. Neuro exam strength is preserved. Cont pain control start scheduled nsaid, cont robaxin, prn morphine   Dizziness - CT head is unremarkable. Probably vertigo. If persists may consider MRI brain. Get physical therapy consult.  Dilated CBD per CAT scan - abdomen appear benign. LFTs: unremarkable   Abdominal aortic aneurysm 3.8 cm - no change since March 2017. Needs follow-up as outpatient in which patient was advised. COPD. clinically stable, not wheezing. Diabetes mellitus type 2 - will hold metformin while inpatient. Patient states she does not want to be on diabetic diet or insulin coverage. While inpatient and will keep patient on [place] which patient agrees to. Hypertension hold off ARB and HCTZ due to contrast exposure. Prn  IV hydralazine for  now.  Pulmonary nodules. D/w patient, she plans outpatient follow up   Code Status: full Family Communication: d/w patient, no family at the bedside (indicate person spoken with, relationship, and if by phone, the number) Disposition Plan: home 24-48 hrs    Consultants:  none  Procedures:  Pend MRI  Antibiotics:  none (indicate start date, and stop date if known)  HPI/Subjective:  she was comfortable while sleeping then woke up reports right shoulder pains worse with movement. Non resolving for several days   Objective: Vitals:   06/08/16 2209 06/09/16 0552  BP: (!) 136/52 (!) 157/62  Pulse: 85 70  Resp: 20 20  Temp: 98.4 F (36.9 C) 98.2 F (36.8 C)    Intake/Output Summary (Last 24 hours) at 06/09/16 1223 Last data filed at 06/08/16 2300  Gross per 24 hour  Intake          1201.25 ml  Output                0 ml  Net          1201.25 ml   Filed Weights   06/08/16 0359  Weight: 74.1 kg (163 lb 5.8 oz)    Exam:   General:  Comfortable   Cardiovascular: s1,s2, rrr  Respiratory: CTA BL  Abdomen: soft, nt, nd   Musculoskeletal: no pedal edema. No synovitis    Data Reviewed: Basic Metabolic Panel:  Recent Labs Lab 06/07/16 1918 06/08/16 0512  NA 138 140  K 3.6 3.4*  CL 105 105  CO2 25 29  GLUCOSE 99 97  BUN 19 19  CREATININE 1.14* 1.23*  CALCIUM 9.1 8.5*   Liver Function Tests:  Recent Labs Lab 06/08/16 0512  AST 15  ALT 17  ALKPHOS 62  BILITOT 0.8  PROT 6.5  ALBUMIN 3.6   No results for input(s): LIPASE, AMYLASE in the last 168 hours. No results for input(s): AMMONIA in the last 168 hours. CBC:  Recent Labs Lab 06/07/16 1918 06/08/16 0512  WBC 9.8 9.2  NEUTROABS  --  4.9  HGB 16.0* 14.7  HCT 47.4* 45.5  MCV 86.0 87.8  PLT 195 184   Cardiac Enzymes: No results for input(s): CKTOTAL, CKMB, CKMBINDEX, TROPONINI in the last 168 hours. BNP (last 3 results) No results for input(s): BNP in the last 8760 hours.  ProBNP  (last 3 results) No results for input(s): PROBNP in the last 8760 hours.  CBG: No results for input(s): GLUCAP in the last 168 hours.  No results found for this or any previous visit (from the past 240 hour(s)).   Studies: Dg Chest 2 View  Result Date: 06/07/2016 CLINICAL DATA:  RIGHT side chest pain radiating the back for 1-2 days with intermittent shortness of breath; history COPD, type II diabetes mellitus, pneumonia, hypertension, GERD EXAM: CHEST  2 VIEW COMPARISON:  03/16/2016 FINDINGS: Normal heart size, mediastinal contours, and pulmonary vascularity. Bronchitic changes with minimal scarring at RIGHT base. No pulmonary infiltrate, pleural effusion or pneumothorax. Bones demineralized. IMPRESSION: Bronchitic changes with RIGHT basilar scarring. No acute infiltrate. Electronically Signed   By: Lavonia Dana M.D.   On: 06/07/2016 18:55   Ct Head Wo Contrast  Result Date: 06/08/2016 CLINICAL DATA:  Acute onset of dizziness.  Initial encounter. EXAM: CT HEAD WITHOUT CONTRAST TECHNIQUE: Contiguous axial images were obtained from the base of the skull through the vertex without intravenous contrast. COMPARISON:  CT of the head performed 06/13/2014, and MRI of the brain performed 06/16/2015 FINDINGS: Brain: No evidence of acute infarction, hemorrhage, hydrocephalus, extra-axial collection or mass lesion/mass effect. Mild periventricular and subcortical white matter change likely reflects small vessel ischemic microangiopathy. The brainstem and fourth ventricle are within normal limits. The basal ganglia are unremarkable in appearance. The cerebral hemispheres demonstrate grossly normal gray-white differentiation. No mass effect or midline shift is seen. Vascular: No hyperdense vessel or unexpected calcification. Skull: There is no evidence of fracture; visualized osseous structures are unremarkable in appearance. Sinuses/Orbits: The orbits are within normal limits. The paranasal sinuses and mastoid air  cells are well-aerated. Other: No significant soft tissue abnormalities are seen. IMPRESSION: 1. No acute intracranial pathology seen on CT. 2. Mild small vessel ischemic microangiopathy. Electronically Signed   By: Garald Balding M.D.   On: 06/08/2016 03:45   Ct Angio Chest Pe W Or Wo Contrast  Result Date: 06/08/2016 CLINICAL DATA:  70 year old female with history of COPD and hypertension presenting to the emergency department because of right-sided pleuritic chest pain for the past 4 days. Pain is worse during deep inspiration and with movements. No associated cough, fever or chills. Patient also has type 2 diabetes. EXAM: CT ANGIOGRAPHY CHEST WITH CONTRAST TECHNIQUE: Multidetector CT imaging of the chest was performed using the standard protocol during bolus administration of intravenous contrast. Multiplanar CT image reconstructions and MIPs were obtained to evaluate the vascular anatomy. CONTRAST:  100 mL of Isovue 370. COMPARISON:  No priors. FINDINGS: Cardiovascular: There are no filling defects within the pulmonary arterial tree to suggest underlying pulmonary embolism. Heart size is normal. There is no significant pericardial fluid, thickening or pericardial calcification. There is aortic atherosclerosis, as  well as atherosclerosis of the great vessels of the mediastinum and the coronary arteries, including calcified atherosclerotic plaque in the left main, left anterior descending, left circumflex and right coronary arteries. Dilatation of the distal aortic arch measuring up to 4.2 cm in diameter immediately above the isthmus. Extensive ulcerated plaque throughout the descending thoracic aorta. Mediastinum/Nodes: Multiple prominent borderline enlarged mediastinal lymph nodes are noted, which are nonspecific. No hilar lymphadenopathy. Esophagus is unremarkable in appearance. No axillary lymphadenopathy. Thyroid gland is very heterogeneous in appearance with mass-like enlargement of the left lobe of the  gland which is asymmetric with the contralateral side measuring up to 4.6 x 2.9 cm, with some internal calcifications. Lungs/Pleura: 7 mm pulmonary nodule in the lateral segment of the right middle lobe (image 79 of series 7). Two 5 mm pulmonary nodules are noted in the left lower lobe (both on image 105 of series 7). Multiple other smaller 2-3 mm pulmonary nodules are seen scattered throughout the periphery of the lungs bilaterally, nonspecific, but favored to reflect areas of mucoid impaction within terminal bronchioles. Mild diffuse bronchial wall thickening with mild to moderate centrilobular emphysema. No acute consolidative airspace disease. No pleural effusions. Upper Abdomen: Aortic atherosclerosis.  Status post cholecystectomy. Musculoskeletal: There are no aggressive appearing lytic or blastic lesions noted in the visualized portions of the skeleton. Review of the MIP images confirms the above findings. IMPRESSION: 1. No evidence of pulmonary embolism. 2. Aortic atherosclerosis, in addition to left main and 3 vessel coronary artery disease. Please note that although the presence of coronary artery calcium documents the presence of coronary artery disease, the severity of this disease and any potential stenosis cannot be assessed on this non-gated CT examination. Assessment for potential risk factor modification, dietary therapy or pharmacologic therapy may be warranted, if clinically indicated. 3. **An incidental finding of potential clinical significance has been found. Multiple small pulmonary nodules in the lungs bilaterally, largest of which measures 7 mm in the lateral segment of the right middle lobe. Non-contrast chest CT at 3-6 months is recommended. If the nodules are stable at time of repeat CT, then future CT at 18-24 months (from today's scan) is considered optional for low-risk patients, but is recommended for high-risk patients. This recommendation follows the consensus statement: Guidelines  for Management of Incidental Pulmonary Nodules Detected on CT Images:From the Fleischner Society 2017; published online before print (10.1148/radiol.IJ:2314499).** 4. 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 This could be further evaluated with nonemergent thyroid ultrasound if of clinical concern. 5. 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 Recommend semi-annual imaging followup by CTA or MRA and referral to cardiothoracic surgery if not already obtained. This recommendation follows 2010 ACCF/AHA/AATS/ACR/ASA/SCA/SCAI/SIR/STS/SVM Guidelines for the Diagnosis and Management of Patients With Thoracic Aortic Disease. Circulation. 2010; 121: e266-e36. Electronically Signed   By: Vinnie Langton M.D.   On: 06/08/2016 08:52   Ct Renal Stone Study  Result Date: 06/07/2016 CLINICAL DATA:  Acute onset of central chest pain and back pain. Initial encounter. EXAM: CT ABDOMEN AND PELVIS WITHOUT CONTRAST TECHNIQUE: Multidetector CT imaging of the abdomen and pelvis was performed following the standard protocol without IV contrast. COMPARISON:  CT of the abdomen and pelvis performed 12/26/2015 FINDINGS: Lower chest: Minimal bibasilar atelectasis or scarring is noted. The visualized portions of the mediastinum are grossly unremarkable. Hepatobiliary: A tiny calcified granuloma is noted within the medial right hepatic  lobe. The patient is status post cholecystectomy, with clips noted at the gallbladder fossa. The common bile duct is prominent, measuring 2.3 cm. Pancreas: The pancreas is unremarkable in appearance. Spleen: The spleen is unremarkable in appearance. Adrenals/Urinary Tract: The adrenal glands are unremarkable in appearance. The kidneys are grossly unremarkable. No perinephric stranding is seen. No renal or ureteral stones are identified. There is no evidence of hydronephrosis.  Stomach/Bowel: The stomach is unremarkable in appearance. Small bowel loops are unremarkable in appearance. The patient is status post appendectomy. Scattered diverticulosis is noted along the distal descending and sigmoid colon, without evidence of diverticulitis. Vascular/Lymphatic: There is mild focal dilatation of the distal descending thoracic aorta to 3.6 cm, which resolves at the level of the proximal abdominal aorta. Underlying intramural thrombus is noted, without significant luminal narrowing, though this is difficult to fully assess without contrast. There is also dilatation of the infrarenal abdominal aorta to 3.8 cm in AP dimension, with associated intramural thrombus. This resolves proximal to the aortic bifurcation. Scattered calcification is noted along the abdominal aorta and its branches. No retroperitoneal lymphadenopathy is seen. No pelvic sidewall lymphadenopathy is appreciated. Reproductive: The bladder is moderately distended and grossly unremarkable. The patient is status post hysterectomy. No suspicious adnexal masses are seen. Other: No significant soft tissue abnormalities are characterized. Musculoskeletal: No acute osseous abnormalities are seen. IMPRESSION: 1. No acute abnormality seen to explain the patient's symptoms. 2. **An incidental finding of potential clinical significance has been found. Dilatation of the infrarenal abdominal aorta to 3.8 cm in AP dimension, with associated intramural thrombus. This resolves proximal to the aortic bifurcation. Scattered calcification along the abdominal aorta and its branches. Recommend followup by ultrasound in 2 years. This recommendation follows ACR consensus guidelines: White Paper of the ACR Incidental Findings Committee II on Vascular Findings. J Am Coll Radiol 2013; 10:789-794.** 3. Mild focal dilatation of the distal descending thoracic aorta to 3.6 cm, which resolves at the level of the proximal abdominal aorta. Underlying intramural  thrombus, without significant luminal narrowing. 4. Increased prominence of the common bile duct, measuring 2.3 cm. 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 5. Scattered diverticulosis along the distal descending and sigmoid colon, without evidence of diverticulitis. Electronically Signed   By: Garald Balding M.D.   On: 06/07/2016 23:54    Scheduled Meds: . enoxaparin (LOVENOX) injection  40 mg Subcutaneous Q24H  . linagliptin  5 mg Oral Daily  . mouth rinse  15 mL Mouth Rinse BID  . methocarbamol  500 mg Oral TID  . pantoprazole  40 mg Oral Daily  . tiotropium  18 mcg Inhalation Daily   Continuous Infusions:   Principal Problem:   Pleuritic chest pain Active Problems:   Hypertension   COPD (chronic obstructive pulmonary disease) ([name])   OSA (obstructive sleep apnea)   Diabetes mellitus type 2, controlled ([name])   Vertigo   AAA (abdominal aortic aneurysm) without rupture ([name])   Right-sided low back pain without sciatica    Time spent: >35 minutes     Kinnie Feil  [facility] Pager [phone]. If 7PM-7AM, please contact night-coverage at [email or web address], password [facility] 06/09/2016, 12:23 PM  LOS: 0 days

## 2016-06-09 NOTE — Care Management Note (Signed)
Case Management Note  Patient Details  Name: Victoria Bird MRN: [id number] Date of Birth: [birth year]  Subjective/Objective:spoke to patient in rm about PT recc -SNF. She has dtr-not always in the house but lives there, & grandson(works). Has cane, rw. Pleasantly decline SNF-CSW notified. Provided w/HHC agency list-await choice. Re-iterated medicare observation status & notice in rm. Patient voiced understanding.Agree to home w/HHC-await choice.                    Action/Plan:d/c plan home w/HHC.   Expected Discharge Date:                  Expected Discharge Plan:  [place]  In-House Referral:  Clinical Social Work  Discharge planning Services  CM Consult  Post Acute Care Choice:  Durable Medical Equipment (cane, rw) Choice offered to:     DME Arranged:    DME Agency:     HH Arranged:    HH Agency:     Status of Service:  In process, will continue to follow  If discussed at Long Length of Stay Meetings, dates discussed:    Additional Comments:  [name], RN 06/09/2016, 12:33 PM

## 2016-06-09 NOTE — Evaluation (Signed)
Physical Therapy Evaluation Patient Details Name: Victoria Bird MRN: [id number] DOB: 03-Feb-1946 Today's Date: 06/09/2016   History of Present Illness  70 y.o. female with COPD, diabetes mellitus type 2, hypertension presents to the ER because of right-sided pleuritic chest pain. CTA negative for PE.  Clinical Impression  Pt admitted with above diagnosis. Pt currently with functional limitations due to the deficits listed below (see PT Problem List).  Pt will benefit from skilled PT to increase their independence and safety with mobility to allow discharge to the venue listed below.   Pt reports intermittent, sporadic sharp right flank pain to 9/10 at times however constantly present and limiting her ability to mobilize.  Pt also reports dizziness with standing and being OOB.  Recommend at least supervision for mobility for safety upon d/c.   Sitting BP: 145/56 mmHg 67 bpm Standing BP: 139/68 mmHg 72 bpm Upon sitting with dizziness ambulating: 151/53 mmHg 65     Follow Up Recommendations SNF;Supervision for mobility/OOB    Equipment Recommendations  None recommended by PT    Recommendations for Other Services       Precautions / Restrictions Precautions Precautions: Fall      Mobility  Bed Mobility Overal bed mobility: Modified Independent             General bed mobility comments: slow but no physical assist  Transfers Overall transfer level: Needs assistance Equipment used: Rolling walker (2 wheeled) Transfers: Sit to/from Stand Sit to Stand: Min guard         General transfer comment: min/guard for safety as pt reports dizziness  Ambulation/Gait Ambulation/Gait assistance: Min guard Ambulation Distance (Feet): 40 Feet Assistive device: Rolling walker (2 wheeled) Gait Pattern/deviations: Step-through pattern     General Gait Details: slow but steady pace with RW, pt reported dizziness after 20 feet requiring seated rest break  Stairs             Wheelchair Mobility    Modified Rankin (Stroke Patients Only)       Balance                                             Pertinent Vitals/Pain Pain Assessment: 0-10 Pain Score: 8  Pain Location: right flank pain Pain Descriptors / Indicators: Sharp Pain Intervention(s): Limited activity within patient's tolerance;Monitored during session    Home Living Family/patient expects to be discharged to:: Private residence Living Arrangements: Children Available Help at Discharge: Available PRN/intermittently Type of Home: Mobile home Home Access: Stairs to enter Entrance Stairs-Rails: Right Entrance Stairs-Number of Steps: 8 Home Layout: One level Home Equipment: Environmental consultant - 2 wheels;Cane - single point      Prior Function Level of Independence: Independent with assistive device(s)         Comments: uses cane in home. sometimes uses walker.      Hand Dominance        Extremity/Trunk Assessment               Lower Extremity Assessment: Overall WFL for tasks assessed         Communication   Communication: No difficulties  Cognition Arousal/Alertness: Awake/alert Behavior During Therapy: WFL for tasks assessed/performed Overall Cognitive Status: Within Functional Limits for tasks assessed                      General Comments  Exercises        Assessment/Plan    PT Assessment Patient needs continued PT services  PT Diagnosis Difficulty walking;Acute pain   PT Problem List Decreased strength;Decreased balance;Decreased knowledge of use of DME;Decreased mobility;Pain;Decreased activity tolerance  PT Treatment Interventions DME instruction;Gait training;Functional mobility training;Therapeutic exercise;Therapeutic activities;Patient/family education   PT Goals (Current goals can be found in the Care Plan section) Acute Rehab PT Goals PT Goal Formulation: With patient Time For Goal Achievement: 06/16/16 Potential to  Achieve Goals: Good    Frequency Min 3X/week   Barriers to discharge        Co-evaluation               End of Session Equipment Utilized During Treatment: Gait belt Activity Tolerance: Patient limited by pain;Other (comment) (dizziness) Patient left: with call bell/phone within reach;in bed;with bed alarm set      Functional Assessment Tool Used: clinical judgement Functional Limitation: Mobility: Walking and moving around Mobility: Walking and Moving Around Current Status [id number]): At least 1 percent but less than 20 percent impaired, limited or restricted Mobility: Walking and Moving Around Goal Status [phone]): At least 1 percent but less than 20 percent impaired, limited or restricted    Time: 1042-1100 PT Time Calculation (min) (ACUTE ONLY): 18 min   Charges:   PT Evaluation $PT Eval Moderate Complexity: 1 Procedure     PT G Codes:   PT G-Codes **NOT FOR INPATIENT CLASS** Functional Assessment Tool Used: clinical judgement Functional Limitation: Mobility: Walking and moving around Mobility: Walking and Moving Around Current Status [id number]): At least 1 percent but less than 20 percent impaired, limited or restricted Mobility: Walking and Moving Around Goal Status [phone]): At least 1 percent but less than 20 percent impaired, limited or restricted    Mayco Walrond,[name] 06/09/2016, 12:48 PM [name], PT, DPT 06/09/2016 Pager: [phone]

## 2016-06-10 ENCOUNTER — Inpatient Hospital Stay (HOSPITAL_COMMUNITY): Payer: Medicare Other

## 2016-06-10 DIAGNOSIS — I10 Essential (primary) hypertension: Secondary | ICD-10-CM

## 2016-06-10 LAB — BASIC METABOLIC PANEL
Anion gap: 5 (ref 5–15)
BUN: 18 mg/dL (ref 6–20)
CALCIUM: 8.4 mg/dL — AB (ref 8.9–10.3)
CO2: 25 mmol/L (ref 22–32)
CREATININE: 1.16 mg/dL — AB (ref 0.44–1.00)
Chloride: 109 mmol/L (ref 101–111)
GFR calc non Af Amer: 47 mL/min — ABNORMAL LOW (ref >60)
GFR, EST AFRICAN AMERICAN: 54 mL/min — AB (ref >60)
Glucose, Bld: 135 mg/dL — ABNORMAL HIGH (ref 65–99)
Potassium: 3.8 mmol/L (ref 3.5–5.1)
SODIUM: 139 mmol/L (ref 135–145)

## 2016-06-10 LAB — CBC
HCT: 40.1 % (ref 36.0–46.0)
Hemoglobin: 13.3 g/dL (ref 12.0–15.0)
MCH: 28.7 pg (ref 26.0–34.0)
MCHC: 33.2 g/dL (ref 30.0–36.0)
MCV: 86.4 fL (ref 78.0–100.0)
PLATELETS: 160 10*3/uL (ref 150–400)
RBC: 4.64 MIL/uL (ref 3.87–5.11)
RDW: 13.7 % (ref 11.5–15.5)
WBC: 7.9 10*3/uL (ref 4.0–10.5)

## 2016-06-10 MED ORDER — POTASSIUM CHLORIDE CRYS ER 20 MEQ PO TBCR
40.0000 meq | EXTENDED_RELEASE_TABLET | Freq: Once | ORAL | Status: AC
  Administered 2016-06-10: 40 meq via ORAL
  Filled 2016-06-10: qty 2

## 2016-06-10 MED ORDER — HYDROCHLOROTHIAZIDE 12.5 MG PO CAPS
12.5000 mg | ORAL_CAPSULE | Freq: Every day | ORAL | Status: DC
  Administered 2016-06-10 – 2016-06-11 (×2): 12.5 mg via ORAL
  Filled 2016-06-10 (×2): qty 1

## 2016-06-10 MED ORDER — LOSARTAN POTASSIUM-HCTZ 100-12.5 MG PO TABS: 1.0000 | ORAL_TABLET | Freq: Every day | ORAL | Status: DC

## 2016-06-10 MED ORDER — HYDROCODONE-ACETAMINOPHEN 5-325 MG PO TABS
1.0000 | ORAL_TABLET | Freq: Four times a day (QID) | ORAL | Status: DC | PRN
  Administered 2016-06-10 – 2016-06-11 (×3): 1 via ORAL
  Filled 2016-06-10 (×4): qty 1

## 2016-06-10 MED ORDER — LOSARTAN POTASSIUM 50 MG PO TABS
100.0000 mg | ORAL_TABLET | Freq: Every day | ORAL | Status: DC
  Administered 2016-06-10 – 2016-06-11 (×2): 100 mg via ORAL
  Filled 2016-06-10 (×2): qty 2

## 2016-06-10 NOTE — Progress Notes (Signed)
[facility] PROGRESS NOTE  Victoria Bird [id number] DOB: 06/12/1946 DOA: 06/07/2016 PCP: Hoyt Koch, MD  Principal Problem:   Pleuritic chest pain Active Problems:   Hypertension   COPD (chronic obstructive pulmonary disease) ([name])   OSA (obstructive sleep apnea)   Diabetes mellitus type 2, controlled ([name])   Vertigo   AAA (abdominal aortic aneurysm) without rupture [facility])  Brief summary   70 y.o. female with COPD, diabetes mellitus type 2, hypertension presents to the ER because of right-sided pleuritic chest pain. Patient has been having this pain for last 4 days. Denies any fall or trauma. In addition patient also has been having dizziness on moving. Patient's chest pain is only on deep inspiration and movements. Denies any productive cough fever or chills. CT of the abdomen shows abdominal attic aneurysm 3.8 cm with no change in size compared to CT done in March 2017. CT chest showed no PE.  Assessment/Plan:  Right-sided shoulder/back pains. Unclear etiology. CTA chest: no PE. Patient reports radiating pain to her right shoulder from her T spine  -T spine MRI negative  -add norco  Dizziness - CT head is unremarkable. Probably vertigo. -MRI brain since dizziness persists PT recs SNF but patient refused  Dilated CBD per CAT scan - abdomen appear benign. LFTs: unremarkable   Abdominal aortic aneurysm 3.8 cm - no change since March 2017. Needs follow-up as outpatient in which patient was advised. COPD. clinically stable, not wheezing. Diabetes mellitus type 2 - will hold metformin while inpatient. Patient states she does not want to be on diabetic diet or insulin coverage. While inpatient and will keep patient on Maud which patient agrees to. Hypertension hold off ARB and HCTZ due to contrast exposure. Prn  IV hydralazine for now.  Pulmonary nodules. D/w patient, she plans outpatient follow up   Code Status: full Family Communication: d/w  patient Disposition Plan: home in AM   Consultants:  none  Procedures:  Pend MRI brain  Antibiotics:  none (indicate start date, and stop date if known)  HPI/Subjective: Pain better yesterday afternoon but worse this AM Was told by pulm dr to see pain clinich but has not gotten appointment yet  Objective: Vitals:   06/09/16 1319 06/10/16 0411  BP: (!) 148/59 (!) 144/56  Pulse: 64 60  Resp: 18 18  Temp: 97.8 F (36.6 C) 98 F (36.7 C)    Intake/Output Summary (Last 24 hours) at 06/10/16 1313 Last data filed at 06/09/16 2300  Gross per 24 hour  Intake              342 ml  Output                0 ml  Net              342 ml   Filed Weights   06/08/16 0359  Weight: 74.1 kg (163 lb 5.8 oz)    Exam:   General:  Comfortable   Cardiovascular: s1,s2, rrr  Respiratory: CTA BL  Abdomen: soft, nt, nd   Musculoskeletal: no pedal edema. No synovitis    Data Reviewed: Basic Metabolic Panel:  Recent Labs Lab 06/07/16 1918 06/08/16 0512 06/10/16 0942  NA 138 140 139  K 3.6 3.4* 3.8  CL 105 105 109  CO2 25 29 25   GLUCOSE 99 97 135*  BUN 19 19 18   CREATININE 1.14* 1.23* 1.16*  CALCIUM 9.1 8.5* 8.4*   Liver Function Tests:  Recent Labs Lab 06/08/16 [phone]  AST 15  ALT 17  ALKPHOS 62  BILITOT 0.8  PROT 6.5  ALBUMIN 3.6   No results for input(s): LIPASE, AMYLASE in the last 168 hours. No results for input(s): AMMONIA in the last 168 hours. CBC:  Recent Labs Lab 06/07/16 1918 06/08/16 0512 06/10/16 0942  WBC 9.8 9.2 7.9  NEUTROABS  --  4.9  --   HGB 16.0* 14.7 13.3  HCT 47.4* 45.5 40.1  MCV 86.0 87.8 86.4  PLT 195 184 160   Cardiac Enzymes: No results for input(s): CKTOTAL, CKMB, CKMBINDEX, TROPONINI in the last 168 hours. BNP (last 3 results) No results for input(s): BNP in the last 8760 hours.  ProBNP (last 3 results) No results for input(s): PROBNP in the last 8760 hours.  CBG: No results for input(s): GLUCAP in the last 168  hours.  No results found for this or any previous visit (from the past 240 hour(s)).   Studies: Mr Thoracic Spine W Wo Contrast  Result Date: 06/09/2016 CLINICAL DATA:  Initial evaluation for acute waxing and waning right-sided lower back pain, radiating to right shoulder. EXAM: MRI THORACIC SPINE WITHOUT AND WITH CONTRAST TECHNIQUE: Multiplanar and multiecho pulse sequences of the thoracic spine were obtained without and with intravenous contrast. CONTRAST:  32mL MULTIHANCE GADOBENATE DIMEGLUMINE 529 MG/ML IV SOLN COMPARISON:  Prior CT from 06/08/2016. FINDINGS: Mild exaggeration of the normal thoracic kyphosis. Vertebral bodies are otherwise normally aligned. No listhesis. Vertebral heights maintained. No evidence for acute, subacute, or chronic fracture. Signal intensity within the vertebral body bone marrow is normal. Few scattered benign hemangiomas noted. No worrisome osseous lesions. No abnormal enhancement. Signal intensity within the thoracic spinal cord is normal. Paraspinous soft tissues within normal limits. No acute soft tissue abnormality. Prominent atheromatous plaque noted within the visualized aorta, better evaluated on prior CT. Partially visualized visceral structures unremarkable. Mild degenerative disc bulging noted at T5-6, T6-7, and T7-8 without significant stenosis. Mild bilateral facet arthrosis at T12-L1. At No other significant degenerative changes within the thoracic spine. No focal disc herniations. No canal or foraminal stenosis. IMPRESSION: 1. No acute abnormality within the thoracic spine. 2. Minimal degenerative disc bulging within the mid thoracic spine without significant stenosis. No other significant degenerative changes identified. No findings to explain patient's back pain. Electronically Signed   By: Jeannine Boga M.D.   On: 06/09/2016 22:45    Scheduled Meds: . enoxaparin (LOVENOX) injection  40 mg Subcutaneous Q24H  . ibuprofen  600 mg Oral TID  .  linagliptin  5 mg Oral Daily  . mouth rinse  15 mL Mouth Rinse BID  . methocarbamol  500 mg Oral TID  . pantoprazole  40 mg Oral Daily  . tiotropium  18 mcg Inhalation Daily   Continuous Infusions:   Principal Problem:   Pleuritic chest pain Active Problems:   Hypertension   COPD (chronic obstructive pulmonary disease) ([name])   OSA (obstructive sleep apnea)   Diabetes mellitus type 2, controlled ([place])   Vertigo   AAA (abdominal aortic aneurysm) without rupture ([name])   Right-sided low back pain without sciatica    Time spent: 25 minutes     [facility] Pager [phone]. If 7PM-7AM, please contact night-coverage at [email or web address], password [name] 06/10/2016, 1:13 PM  LOS: 1 day

## 2016-06-11 ENCOUNTER — Inpatient Hospital Stay (HOSPITAL_COMMUNITY): Payer: Medicare Other

## 2016-06-11 DIAGNOSIS — G4733 Obstructive sleep apnea (adult) (pediatric): Secondary | ICD-10-CM

## 2016-06-11 MED ORDER — METHOCARBAMOL 500 MG PO TABS: 500.0000 mg | ORAL_TABLET | Freq: Three times a day (TID) | ORAL | 0 refills | Status: DC | PRN

## 2016-06-11 MED ORDER — HYDROCODONE-ACETAMINOPHEN 5-325 MG PO TABS: 1.0000 | ORAL_TABLET | ORAL | 0 refills | Status: DC | PRN

## 2016-06-11 MED ORDER — HYDROCODONE-ACETAMINOPHEN 5-325 MG PO TABS
1.0000 | ORAL_TABLET | ORAL | Status: DC | PRN
  Administered 2016-06-11: 1 via ORAL
  Filled 2016-06-11: qty 1

## 2016-06-11 NOTE — Progress Notes (Signed)
Pt was given discharge instructions and prescriptions. Instructions were explained and all questions were answered. NT took pt out in wheelchair where family members took her home.  [place]

## 2016-06-11 NOTE — Progress Notes (Signed)
Patient refused home health physical therapy, stated she does not want PT at home. MD- Dr. Eliseo Squires informed.

## 2016-06-11 NOTE — Care Management Note (Addendum)
Case Management Note  Patient Details  Name: Victoria Bird MRN: [id number] Date of Birth: 01/06/1946  Subjective/Objective:  Pleuritic chest pain, HTN, COPD                   Action/Plan: Discharge Planning: AVS reviewed:  Please see previous NCM notes   NCM spoke to pt and pt declines HH. She has cane and RW at home.   [name]  MD   Expected Discharge Date:                  Expected Discharge Plan:  [place]  In-House Referral:  Clinical Social Work  Discharge planning Services  CM Consult  Post Acute Care Choice:  Home Health (cane, rw) Choice offered to:  Patient  DME Arranged:  N/A DME Agency:  NA  HH Arranged:  Patient Refused [place] Agency:  NA  Status of Service:  Completed, signed off  If discussed at [place] of Stay Meetings, dates discussed:    Additional Comments:  [name], RN 06/11/2016, 1:19 PM

## 2016-06-11 NOTE — Discharge Summary (Signed)
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 Recommend semi-annual imaging followup by CTA or MRA and referral to cardiothoracic surgery if not already obtained. This recommendation follows 2010 ACCF/AHA/AATS/ACR/ASA/SCA/SCAI/SIR/STS/SVM Guidelines for the Diagnosis and Management of Patients With Thoracic Aortic Disease  Discharge Diagnosis:   Principal Problem:   Pleuritic chest pain Active Problems:   Hypertension   COPD (chronic obstructive pulmonary disease) ([name])   OSA (obstructive sleep apnea)   Diabetes mellitus type 2, controlled ([name])   Vertigo   AAA (abdominal aortic aneurysm) without rupture ([name])   Right-sided low back pain without sciatica   Discharge disposition:  Home.   Discharge Condition: stable.  Diet recommendation: Low sodium, heart healthy.  Carbohydrate-modified.   Wound care: None.   History of Present Illness:    Victoria Bird is a 70 y.o. female with COPD, diabetes mellitus type 2, hypertension presents to the ER because of right-sided pleuritic chest pain. Patient has been having this pain for last 4 days. Denies any fall or trauma. In addition patient also has been having dizziness on moving. Patient's chest pain is only on deep inspiration and movements. Denies any productive cough fever or chills. CT of the abdomen shows abdominal attic aneurysm 3.8 cm with no change in size compared to CT done in March 2017. There is also increased dilatation of the CBD. 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 On exam patient does not have any rash on the skin at the site of the pain. Patient has significant pain on minimal movement. Patient states she gets dizzy  on standing. Patient was not orthostatic. Patient is being admitted for right-sided pleuritic chest pain and dizziness. Chest x-ray shows a right basilar scarring and bronchitic changes.   Hospital Course by Problem:   Right-sided shoulder/back pains. Unclear etiology. CTA chest: no PE. Patient reports radiating pain to her right shoulder from her T spine  -T spine MRI negative  -added norco with some benefit -no sign of herpes zoster rash -chart review shows chronic pain- outpatient follow up  Dizziness- CT head is unremarkable. Probably vertigo. -MRI brain negative for CVA PT recs SNF but patient refused-- she also refused home health  Dilated CBDper CAT scan - abdomen appear benign. LFTs: unremarkable   Abdominal aortic aneurysm 3.8 cm - no change since March 2017. Needs follow-up as outpatient in which patient was advised. COPD. clinically stable, not wheezing. Diabetes mellitus type 2- resume home meds Hypertensionresume home meds  Pulmonary nodules. D/w patient, she plans outpatient follow up  Called daughter to discuss dispo and results of imaging-- no answer   Medical Consultants:    None.   Discharge Exam:   Vitals:   06/11/16 0507 06/11/16 1352  BP: (!) 167/76 (!) 153/68  Pulse: 62 88  Resp: 18 20  Temp: 98.7 F (37.1 C) 97.5 F (36.4 C)   Vitals:   06/10/16 1750 06/11/16 0507 06/11/16 0945 06/11/16 1352  BP: (!) 185/71 (!) 167/76  (!) 153/68  Pulse:  62  88  Resp:  18  20  Temp:  98.7 F (37.1 C)  97.5 F (36.4 C)  TempSrc:  Oral  Oral  SpO2: 98% 94% 94% 97%  Weight:      Height:  Gen:  NAD- per nursing up walking around room   The results of significant diagnostics from this hospitalization (including imaging, microbiology, ancillary and laboratory) are listed below for reference.     Procedures and Diagnostic Studies:   Dg Chest 2 View  Result Date: 06/07/2016 CLINICAL DATA:  RIGHT side chest pain radiating the back for 1-2  days with intermittent shortness of breath; history COPD, type II diabetes mellitus, pneumonia, hypertension, GERD EXAM: CHEST  2 VIEW COMPARISON:  03/16/2016 FINDINGS: Normal heart size, mediastinal contours, and pulmonary vascularity. Bronchitic changes with minimal scarring at RIGHT base. No pulmonary infiltrate, pleural effusion or pneumothorax. Bones demineralized. IMPRESSION: Bronchitic changes with RIGHT basilar scarring. No acute infiltrate. Electronically Signed   By: Lavonia Dana M.D.   On: 06/07/2016 18:55   Ct Head Wo Contrast  Result Date: 06/08/2016 CLINICAL DATA:  Acute onset of dizziness.  Initial encounter. EXAM: CT HEAD WITHOUT CONTRAST TECHNIQUE: Contiguous axial images were obtained from the base of the skull through the vertex without intravenous contrast. COMPARISON:  CT of the head performed 06/13/2014, and MRI of the brain performed 06/16/2015 FINDINGS: Brain: No evidence of acute infarction, hemorrhage, hydrocephalus, extra-axial collection or mass lesion/mass effect. Mild periventricular and subcortical white matter change likely reflects small vessel ischemic microangiopathy. The brainstem and fourth ventricle are within normal limits. The basal ganglia are unremarkable in appearance. The cerebral hemispheres demonstrate grossly normal gray-white differentiation. No mass effect or midline shift is seen. Vascular: No hyperdense vessel or unexpected calcification. Skull: There is no evidence of fracture; visualized osseous structures are unremarkable in appearance. Sinuses/Orbits: The orbits are within normal limits. The paranasal sinuses and mastoid air cells are well-aerated. Other: No significant soft tissue abnormalities are seen. IMPRESSION: 1. No acute intracranial pathology seen on CT. 2. Mild small vessel ischemic microangiopathy. Electronically Signed   By: Garald Balding M.D.   On: 06/08/2016 03:45   Ct Angio Chest Pe W Or Wo Contrast  Result Date: 06/08/2016 CLINICAL DATA:   70 year old female with history of COPD and hypertension presenting to the emergency department because of right-sided pleuritic chest pain for the past 4 days. Pain is worse during deep inspiration and with movements. No associated cough, fever or chills. Patient also has type 2 diabetes. EXAM: CT ANGIOGRAPHY CHEST WITH CONTRAST TECHNIQUE: Multidetector CT imaging of the chest was performed using the standard protocol during bolus administration of intravenous contrast. Multiplanar CT image reconstructions and MIPs were obtained to evaluate the vascular anatomy. CONTRAST:  100 mL of Isovue 370. COMPARISON:  No priors. FINDINGS: Cardiovascular: There are no filling defects within the pulmonary arterial tree to suggest underlying pulmonary embolism. Heart size is normal. There is no significant pericardial fluid, thickening or pericardial calcification. There is aortic atherosclerosis, as well as atherosclerosis of the great vessels of the mediastinum and the coronary arteries, including calcified atherosclerotic plaque in the left main, left anterior descending, left circumflex and right coronary arteries. Dilatation of the distal aortic arch measuring up to 4.2 cm in diameter immediately above the isthmus. Extensive ulcerated plaque throughout the descending thoracic aorta. Mediastinum/Nodes: Multiple prominent borderline enlarged mediastinal lymph nodes are noted, which are nonspecific. No hilar lymphadenopathy. Esophagus is unremarkable in appearance. No axillary lymphadenopathy. Thyroid gland is very heterogeneous in appearance with mass-like enlargement of the left lobe of the gland which is asymmetric with the contralateral side measuring up to 4.6 x 2.9 cm, with some internal calcifications. Lungs/Pleura: 7 mm pulmonary nodule in the lateral segment of the  right middle lobe (image 79 of series 7). Two 5 mm pulmonary nodules are noted in the left lower lobe (both on image 105 of series 7). Multiple other  smaller 2-3 mm pulmonary nodules are seen scattered throughout the periphery of the lungs bilaterally, nonspecific, but favored to reflect areas of mucoid impaction within terminal bronchioles. Mild diffuse bronchial wall thickening with mild to moderate centrilobular emphysema. No acute consolidative airspace disease. No pleural effusions. Upper Abdomen: Aortic atherosclerosis.  Status post cholecystectomy. Musculoskeletal: There are no aggressive appearing lytic or blastic lesions noted in the visualized portions of the skeleton. Review of the MIP images confirms the above findings. IMPRESSION: 1. No evidence of pulmonary embolism. 2. Aortic atherosclerosis, in addition to left main and 3 vessel coronary artery disease. Please note that although the presence of coronary artery calcium documents the presence of coronary artery disease, the severity of this disease and any potential stenosis cannot be assessed on this non-gated CT examination. Assessment for potential risk factor modification, dietary therapy or pharmacologic therapy may be warranted, if clinically indicated. 3. **An incidental finding of potential clinical significance has been found. Multiple small pulmonary nodules in the lungs bilaterally, largest of which measures 7 mm in the lateral segment of the right middle lobe. Non-contrast chest CT at 3-6 months is recommended. If the nodules are stable at time of repeat CT, then future CT at 18-24 months (from today's scan) is considered optional for low-risk patients, but is recommended for high-risk patients. This recommendation follows the consensus statement: Guidelines for Management of Incidental Pulmonary Nodules Detected on CT Images:From the Fleischner Society 2017; published online before print (10.1148/radiol.IJ:2314499).** 4. 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 This could  be further evaluated with nonemergent thyroid ultrasound if of clinical concern. 5. 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 Recommend semi-annual imaging followup by CTA or MRA and referral to cardiothoracic surgery if not already obtained. This recommendation follows 2010 ACCF/AHA/AATS/ACR/ASA/SCA/SCAI/SIR/STS/SVM Guidelines for the Diagnosis and Management of Patients With Thoracic Aortic Disease. Circulation. 2010; 121: e266-e36. Electronically Signed   By: Vinnie Langton M.D.   On: 06/08/2016 08:52   Ct Renal Stone Study  Result Date: 06/07/2016 CLINICAL DATA:  Acute onset of central chest pain and back pain. Initial encounter. EXAM: CT ABDOMEN AND PELVIS WITHOUT CONTRAST TECHNIQUE: Multidetector CT imaging of the abdomen and pelvis was performed following the standard protocol without IV contrast. COMPARISON:  CT of the abdomen and pelvis performed 12/26/2015 FINDINGS: Lower chest: Minimal bibasilar atelectasis or scarring is noted. The visualized portions of the mediastinum are grossly unremarkable. Hepatobiliary: A tiny calcified granuloma is noted within the medial right hepatic lobe. The patient is status post cholecystectomy, with clips noted at the gallbladder fossa. The common bile duct is prominent, measuring 2.3 cm. Pancreas: The pancreas is unremarkable in appearance. Spleen: The spleen is unremarkable in appearance. Adrenals/Urinary Tract: The adrenal glands are unremarkable in appearance. The kidneys are grossly unremarkable. No perinephric stranding is seen. No renal or ureteral stones are identified. There is no evidence of hydronephrosis. Stomach/Bowel: The stomach is unremarkable in appearance. Small bowel loops are unremarkable in appearance. The patient is status post appendectomy. Scattered diverticulosis is noted along the distal descending and sigmoid colon, without evidence of diverticulitis. Vascular/Lymphatic: There is mild focal dilatation of the distal descending  thoracic aorta to 3.6 cm, which resolves at the level of  the proximal abdominal aorta. Underlying intramural thrombus is noted, without significant luminal narrowing, though this is difficult to fully assess without contrast. There is also dilatation of the infrarenal abdominal aorta to 3.8 cm in AP dimension, with associated intramural thrombus. This resolves proximal to the aortic bifurcation. Scattered calcification is noted along the abdominal aorta and its branches. No retroperitoneal lymphadenopathy is seen. No pelvic sidewall lymphadenopathy is appreciated. Reproductive: The bladder is moderately distended and grossly unremarkable. The patient is status post hysterectomy. No suspicious adnexal masses are seen. Other: No significant soft tissue abnormalities are characterized. Musculoskeletal: No acute osseous abnormalities are seen. IMPRESSION: 1. No acute abnormality seen to explain the patient's symptoms. 2. **An incidental finding of potential clinical significance has been found. Dilatation of the infrarenal abdominal aorta to 3.8 cm in AP dimension, with associated intramural thrombus. This resolves proximal to the aortic bifurcation. Scattered calcification along the abdominal aorta and its branches. Recommend followup by ultrasound in 2 years. This recommendation follows ACR consensus guidelines: White Paper of the ACR Incidental Findings Committee II on Vascular Findings. J Am Coll Radiol 2013; 10:789-794.** 3. Mild focal dilatation of the distal descending thoracic aorta to 3.6 cm, which resolves at the level of the proximal abdominal aorta. Underlying intramural thrombus, without significant luminal narrowing. 4. Increased prominence of the common bile duct, measuring 2.3 cm. 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 5. Scattered diverticulosis along the distal descending and sigmoid colon, without evidence of diverticulitis. Electronically Signed   By: Garald Balding M.D.    On: 06/07/2016 23:54     Labs:   Basic Metabolic Panel:  Recent Labs Lab 06/07/16 1918 06/08/16 0512 06/10/16 0942  NA 138 140 139  K 3.6 3.4* 3.8  CL 105 105 109  CO2 25 29 25   GLUCOSE 99 97 135*  BUN 19 19 18   CREATININE 1.14* 1.23* 1.16*  CALCIUM 9.1 8.5* 8.4*   GFR Estimated Creatinine Clearance: 44.1 mL/min (by C-G formula based on SCr of 1.16 mg/dL). Liver Function Tests:  Recent Labs Lab 06/08/16 0512  AST 15  ALT 17  ALKPHOS 62  BILITOT 0.8  PROT 6.5  ALBUMIN 3.6   No results for input(s): LIPASE, AMYLASE in the last 168 hours. No results for input(s): AMMONIA in the last 168 hours. Coagulation profile No results for input(s): INR, PROTIME in the last 168 hours.  CBC:  Recent Labs Lab 06/07/16 1918 06/08/16 0512 06/10/16 0942  WBC 9.8 9.2 7.9  NEUTROABS  --  4.9  --   HGB 16.0* 14.7 13.3  HCT 47.4* 45.5 40.1  MCV 86.0 87.8 86.4  PLT 195 184 160   Cardiac Enzymes: No results for input(s): CKTOTAL, CKMB, CKMBINDEX, TROPONINI in the last 168 hours. BNP: Invalid input(s): POCBNP CBG: No results for input(s): GLUCAP in the last 168 hours. D-Dimer No results for input(s): DDIMER in the last 72 hours. Hgb A1c No results for input(s): HGBA1C in the last 72 hours. Lipid Profile No results for input(s): CHOL, HDL, LDLCALC, TRIG, CHOLHDL, LDLDIRECT in the last 72 hours. Thyroid function studies No results for input(s): TSH, T4TOTAL, T3FREE, THYROIDAB in the last 72 hours.  Invalid input(s): FREET3 Anemia work up No results for input(s): VITAMINB12, FOLATE, FERRITIN, TIBC, IRON, RETICCTPCT in the last 72 hours. Microbiology No results found for this or any previous visit (from the past 240 hour(s)).   Discharge Instructions:   Discharge Instructions    Diet - low sodium heart healthy    Complete  by:  As directed   Diet Carb Modified    Complete by:  As directed   Discharge instructions    Complete by:  As directed   Outpatient referral  to pain specialist   Increase activity slowly    Complete by:  As directed       Medication List    TAKE these medications   albuterol 108 (90 Base) MCG/ACT inhaler Commonly known as:  PROVENTIL HFA;VENTOLIN HFA Inhale 1-2 puffs into the lungs every 4 (four) hours as needed. For shortness of breath.   ALPRAZolam 0.5 MG tablet Commonly known as:  XANAX Take 1 tablet (0.5 mg total) by mouth at bedtime as needed for anxiety.   HYDROcodone-acetaminophen 5-325 MG tablet Commonly known as:  NORCO/VICODIN Take 1 tablet by mouth every 4 (four) hours as needed for severe pain.   ipratropium 0.02 % nebulizer solution Commonly known as:  ATROVENT Take 0.5 mg by nebulization. As needed for SOB   losartan-hydrochlorothiazide 100-12.5 MG tablet Commonly known as:  HYZAAR Take 1 tablet by mouth daily.   metFORMIN 500 MG tablet Commonly known as:  GLUCOPHAGE TAKE 1 TABLET(500 MG) BY MOUTH TWICE DAILY WITH A MEAL   methocarbamol 500 MG tablet Commonly known as:  ROBAXIN Take 1 tablet (500 mg total) by mouth every 8 (eight) hours as needed for muscle spasms.   pantoprazole 40 MG tablet Commonly known as:  PROTONIX Take 1 tablet (40 mg total) by mouth daily.   tiotropium 18 MCG inhalation capsule Commonly known as:  SPIRIVA Place 1 capsule (18 mcg total) into inhaler and inhale daily.         Time coordinating discharge: 35 min  Signed:  Raul Torrance U Oziel Beitler   [facility] 06/11/2016, 3:40 PM

## 2016-06-16 ENCOUNTER — Telehealth: Payer: Self-pay | Admitting: Family Medicine

## 2016-06-16 ENCOUNTER — Encounter (HOSPITAL_COMMUNITY): Payer: Self-pay | Admitting: Emergency Medicine

## 2016-06-16 ENCOUNTER — Telehealth: Payer: Self-pay | Admitting: Pulmonary Disease

## 2016-06-16 ENCOUNTER — Emergency Department (HOSPITAL_COMMUNITY): Payer: Medicare Other

## 2016-06-16 DIAGNOSIS — I10 Essential (primary) hypertension: Secondary | ICD-10-CM | POA: Insufficient documentation

## 2016-06-16 DIAGNOSIS — E119 Type 2 diabetes mellitus without complications: Secondary | ICD-10-CM | POA: Insufficient documentation

## 2016-06-16 DIAGNOSIS — Z7984 Long term (current) use of oral hypoglycemic drugs: Secondary | ICD-10-CM | POA: Diagnosis not present

## 2016-06-16 DIAGNOSIS — M545 Low back pain: Secondary | ICD-10-CM | POA: Insufficient documentation

## 2016-06-16 DIAGNOSIS — Z79899 Other long term (current) drug therapy: Secondary | ICD-10-CM | POA: Insufficient documentation

## 2016-06-16 DIAGNOSIS — F17210 Nicotine dependence, cigarettes, uncomplicated: Secondary | ICD-10-CM | POA: Diagnosis not present

## 2016-06-16 DIAGNOSIS — J449 Chronic obstructive pulmonary disease, unspecified: Secondary | ICD-10-CM | POA: Diagnosis not present

## 2016-06-16 DIAGNOSIS — Z7951 Long term (current) use of inhaled steroids: Secondary | ICD-10-CM | POA: Diagnosis not present

## 2016-06-16 DIAGNOSIS — J9811 Atelectasis: Secondary | ICD-10-CM | POA: Diagnosis not present

## 2016-06-16 DIAGNOSIS — G8929 Other chronic pain: Secondary | ICD-10-CM | POA: Diagnosis not present

## 2016-06-16 NOTE — ED Triage Notes (Signed)
Pt c/o pain to R scapula area radiating to R flank and R upper quad. Pt seen for same recently with admission. Pt states pain is now worse with intermittent stabbing pain radiating through to R chest. Pt denies n/v/d, pt states pain worse on R side when she attempts to lie down on L side. Pt states Vicodin is not helping.

## 2016-06-16 NOTE — Telephone Encounter (Signed)
Received call from [facility] regarding this patient. They report the patient calls with complaints of pain in her right upper back near her scapula and pain in her right flank. She was on morphine for this, though ran out and is now taking vicodin for this with reduction in her pain to 6/10 for 2 hours, then the pain worsens significantly. The RN reports that she has to hold herself up to walk due to the pain. She was advised to call her PCP by her pulmonologist due to his concern for shingles. She was apparently recently hospitalized for this issue and had an extensive evaluation that did not reveal a specific cause for this. Given her level of pain and this difficulty walking I advised reevaluation for this issue and given the time of day I recommended ED evaluation. [facility] RN noted she would advise the patient to go to the ED for evaluation. FYI to PCP.

## 2016-06-16 NOTE — Telephone Encounter (Signed)
Returned patient's phone call to discuss her ongoing right sided scapular pain. Patient denies any rash. Recently hospitalized with extensive workup which was negative including CT angiogram of the chest. Patient reports pain started one morning after awakening. Denies any strenuous activity or exertion. Patient denies any worsening of pain with movement. Patient reports sometimes she has an itching sensation in the same area as the pain. Pain is unrelenting. Minimal if any relief with Norco. Patient has not had shingles vaccine. Recommended contacting her primary care physician's office as I feel this is likely an early outbreak of herpes zoster. Patient does have previous adverse reaction to Neurontin.

## 2016-06-16 NOTE — Telephone Encounter (Signed)
LMTCB

## 2016-06-17 ENCOUNTER — Emergency Department (HOSPITAL_COMMUNITY)
Admission: EM | Admit: 2016-06-17 | Discharge: 2016-06-17 | Disposition: A | Discharge status: Home / Self Care | Payer: Medicare Other | Attending: Emergency Medicine | Admitting: Emergency Medicine

## 2016-06-17 DIAGNOSIS — G8929 Other chronic pain: Secondary | ICD-10-CM

## 2016-06-17 DIAGNOSIS — M545 Low back pain: Secondary | ICD-10-CM

## 2016-06-17 LAB — BASIC METABOLIC PANEL
ANION GAP: 7 (ref 5–15)
BUN: 13 mg/dL (ref 6–20)
CALCIUM: 9.1 mg/dL (ref 8.9–10.3)
CO2: 29 mmol/L (ref 22–32)
Chloride: 101 mmol/L (ref 101–111)
Creatinine, Ser: 1.11 mg/dL — ABNORMAL HIGH (ref 0.44–1.00)
GFR, EST AFRICAN AMERICAN: 57 mL/min — AB (ref >60)
GFR, EST NON AFRICAN AMERICAN: 49 mL/min — AB (ref >60)
GLUCOSE: 139 mg/dL — AB (ref 65–99)
POTASSIUM: 3.5 mmol/L (ref 3.5–5.1)
Sodium: 137 mmol/L (ref 135–145)

## 2016-06-17 LAB — CBC
HEMATOCRIT: 42.5 % (ref 36.0–46.0)
HEMOGLOBIN: 14.4 g/dL (ref 12.0–15.0)
MCH: 28.7 pg (ref 26.0–34.0)
MCHC: 33.9 g/dL (ref 30.0–36.0)
MCV: 84.8 fL (ref 78.0–100.0)
Platelets: 250 10*3/uL (ref 150–400)
RBC: 5.01 MIL/uL (ref 3.87–5.11)
RDW: 13.5 % (ref 11.5–15.5)
WBC: 10.1 10*3/uL (ref 4.0–10.5)

## 2016-06-17 LAB — I-STAT TROPONIN, ED: Troponin i, poc: 0 ng/mL (ref 0.00–0.08)

## 2016-06-17 MED ORDER — HYDROCODONE-ACETAMINOPHEN 7.5-325 MG PO TABS: 1.0000 | ORAL_TABLET | Freq: Four times a day (QID) | ORAL | 0 refills | Status: DC | PRN

## 2016-06-17 MED ORDER — NAPROXEN 375 MG PO TABS: 375.0000 mg | ORAL_TABLET | Freq: Two times a day (BID) | ORAL | 0 refills | Status: DC

## 2016-06-17 MED ORDER — TIZANIDINE HCL 4 MG PO TABS: 4.0000 mg | ORAL_TABLET | Freq: Four times a day (QID) | ORAL | 0 refills | Status: DC | PRN

## 2016-06-17 MED ORDER — METHOCARBAMOL 1000 MG/10ML IJ SOLN: 1000.0000 mg | Freq: Once | INTRAMUSCULAR | Status: DC

## 2016-06-17 MED ORDER — METHOCARBAMOL 1000 MG/10ML IJ SOLN
1000.0000 mg | Freq: Once | INTRAVENOUS | Status: AC
  Administered 2016-06-17: 1000 mg via INTRAVENOUS
  Filled 2016-06-17: qty 10

## 2016-06-17 MED ORDER — KETOROLAC TROMETHAMINE 30 MG/ML IJ SOLN
30.0000 mg | Freq: Once | INTRAMUSCULAR | Status: AC
  Administered 2016-06-17: 30 mg via INTRAVENOUS
  Filled 2016-06-17: qty 1

## 2016-06-17 MED ORDER — MORPHINE SULFATE (PF) 4 MG/ML IV SOLN
4.0000 mg | Freq: Once | INTRAVENOUS | Status: AC
  Administered 2016-06-17: 4 mg via INTRAVENOUS
  Filled 2016-06-17: qty 1

## 2016-06-17 NOTE — ED Provider Notes (Signed)
[facility] DEPT Provider Note   CSN: [id number] Arrival date & time: 06/16/16  2206   By signing my name below, I, Dolores Hoose, attest that this documentation has been prepared under the direction and in the presence of Delora Fuel, MD . Electronically Signed: Dolores Hoose, Scribe. 06/17/2016. 2:14 AM.  History   Chief Complaint Chief Complaint  Patient presents with  . Back Pain   The history is provided by the patient. No language interpreter was used.    HPI Comments:  Victoria Bird is a 70 y.o. female who presents to the Emergency Department complaining of Ongoing right back pain since last week. Pt describes her symptoms as a sharp, 7/10, pain that radiates anteriorly from her back, through her shoulder, into her upper right chest. Her pain is worsened by laying on her left side, and is occasionally alleviated by heating pads. Pt was seen for similar symptoms last week, 06/07/2016, where she was given vicodin which she has been compliant in taking, but with only partial relief. She denies any nausea, vomiting or diarrhea. She denies bowel or bladder dysfunction. She denies weakness, numbness, tingling.  PCP: Pricilla Holm [phone]  Past Medical History:  Diagnosis Date  . Anxiety   . Chronic pain   . Colon polyps    adenomatous  . COPD (chronic obstructive pulmonary disease) ([place])   . Diabetes mellitus   . Diverticulitis   . Diverticulosis   . Emphysema lung ([place])   . Gall stones   . GERD (gastroesophageal reflux disease)   . H/O: pneumonia   . High cholesterol   . Hypertension   . OSA (obstructive sleep apnea)   . Tracheobronchitis 01/01/2012    Patient Active Problem List   Diagnosis Date Noted  . Vertigo 06/08/2016  . AAA (abdominal aortic aneurysm) without rupture ([place]) 06/08/2016  . Right-sided low back pain without sciatica   . COPD with exacerbation ([place]) 03/10/2016  . Abdominal pain 02/25/2016  . Adjustment disorder with mixed anxiety and  depressed mood 02/25/2016  . Overweight (BMI 25.0-29.9) 01/25/2016  . Allergic rhinitis 11/17/2015  . Tobacco use disorder 06/22/2015  . Nausea and vomiting 06/16/2015  . Diabetes mellitus type 2, controlled ([place]) 06/16/2015  . H/O recurrent pneumonia 05/27/2015  . Chronic respiratory failure ([place]) 05/27/2015  . GERD (gastroesophageal reflux disease) 05/27/2015  . Hyperlipidemia 05/27/2015  . Pleuritic chest pain 05/27/2015  . Chronic pain syndrome 05/27/2015  . Thyroid nodule 01/20/2015  . COPD (chronic obstructive pulmonary disease) ([place]) 10/09/2014  . Diabetes mellitus ([place]) 12/14/2011  . Hypertension 12/14/2011  . OSA (obstructive sleep apnea) 10/02/2008    Past Surgical History:  Procedure Laterality Date  . ABDOMINAL HYSTERECTOMY    . APPENDECTOMY    . Bilateral shoulder surgery    . CARPAL TUNNEL RELEASE Left   . CHOLECYSTECTOMY    . COLONOSCOPY    . ESOPHAGOGASTRODUODENOSCOPY    . FRACTURE SURGERY    . Right knee surgery    . ROTATOR CUFF REPAIR      OB History    No data available       Home Medications    Prior to Admission medications   Medication Sig Start Date End Date Taking? Authorizing Provider  albuterol (PROVENTIL HFA;VENTOLIN HFA) 108 (90 Base) MCG/ACT inhaler Inhale 1-2 puffs into the lungs every 4 (four) hours as needed. For shortness of breath. 01/25/16   Golden Circle, FNP  ALPRAZolam Duanne Moron) 0.5 MG tablet Take 1 tablet (0.5 mg total) by  mouth at bedtime as needed for anxiety. 02/25/16   Hoyt Koch, MD  HYDROcodone-acetaminophen (NORCO/VICODIN) 5-325 MG tablet Take 1 tablet by mouth every 4 (four) hours as needed for severe pain. 06/11/16   Geradine Girt, DO  ipratropium (ATROVENT) 0.02 % nebulizer solution Take 0.5 mg by nebulization. As needed for SOB 03/28/16   Historical Provider, MD  losartan-hydrochlorothiazide (HYZAAR) 100-12.5 MG tablet Take 1 tablet by mouth daily. 02/25/16   Hoyt Koch, MD  metFORMIN (GLUCOPHAGE) 500  MG tablet TAKE 1 TABLET(500 MG) BY MOUTH TWICE DAILY WITH A MEAL 06/02/16   Hoyt Koch, MD  methocarbamol (ROBAXIN) 500 MG tablet Take 1 tablet (500 mg total) by mouth every 8 (eight) hours as needed for muscle spasms. 06/11/16   Geradine Girt, DO  pantoprazole (PROTONIX) 40 MG tablet Take 1 tablet (40 mg total) by mouth daily. 02/25/16   Hoyt Koch, MD  tiotropium (SPIRIVA) 18 MCG inhalation capsule Place 1 capsule (18 mcg total) into inhaler and inhale daily. 12/18/15   Kelvin Cellar, MD    Family History Family History  Problem Relation Age of Onset  . Heart attack Mother   . Diabetes Maternal Grandfather   . Heart attack Maternal Grandfather   . Hypertension Maternal Grandfather   . Stroke Maternal Grandfather   . Heart attack Maternal Grandmother   . Hypertension Maternal Grandmother   . Alcohol abuse Father   . Thyroid disease Neg Hx   . Lung disease Neg Hx   . Colon cancer Neg Hx     Social History Social History  Substance Use Topics  . Smoking status: Current Every Day Smoker    Packs/day: 0.50    Years: 41.00    Types: Cigarettes    Start date: 03/19/1974  . Smokeless tobacco: Never Used     Comment: Peak rate of 1.5ppd, tobacco infor given  . Alcohol use No     Allergies   Aspartame and phenylalanine; Doxycycline; Tramadol; Codeine; Neurontin [gabapentin]; Sulfa antibiotics; Penicillins; and Prednisone   Review of Systems Review of Systems  Cardiovascular: Positive for chest pain.  Gastrointestinal: Negative for diarrhea, nausea and vomiting.  Musculoskeletal: Positive for back pain.  All other systems reviewed and are negative.    Physical Exam Updated Vital Signs BP 114/67 (BP Location: Right Arm)   Pulse 81   Temp 97.9 F (36.6 C) (Oral)   Resp 22   SpO2 92%   Physical Exam  Constitutional: She appears well-developed and well-nourished. No distress.  HENT:  Head: Normocephalic and atraumatic.  Eyes: Conjunctivae are normal.    Neck: Neck supple.  Cardiovascular: Normal rate and regular rhythm.   No murmur heard. Pulmonary/Chest: Effort normal and breath sounds normal. No respiratory distress.  Abdominal: Soft. There is no tenderness.  Musculoskeletal: She exhibits no edema.  Tender in the right posterior chest wall. Moderate right paraspinal muscle spasm.   Neurological: She is alert.  Skin: Skin is warm and dry.  Psychiatric: She has a normal mood and affect.  Nursing note and vitals reviewed.    ED Treatments / Results  DIAGNOSTIC STUDIES:  Oxygen Saturation is 92% on Ra, low by my interpretation.    COORDINATION OF CARE:  2:29 AM Discussed treatment plan with pt at bedside and pt agreed to plan.  Labs (all labs ordered are listed, but only abnormal results are displayed) Labs Reviewed  BASIC METABOLIC PANEL - Abnormal; Notable for the following:  Result Value   Glucose, Bld 139 (*)    Creatinine, Ser 1.11 (*)    GFR calc non Af Amer 49 (*)    GFR calc Af Amer 57 (*)    All other components within normal limits  CBC  I-STAT TROPOININ, ED    Radiology Dg Chest 2 View  Result Date: 06/16/2016 CLINICAL DATA:  Subacute onset of right-sided back pain, radiating to the chest. Initial encounter. EXAM: CHEST  2 VIEW COMPARISON:  Chest radiograph performed 06/11/2016 FINDINGS: The lungs are well-aerated. Vascular congestion is noted. Mild bibasilar atelectasis is noted. There is no evidence of pleural effusion or pneumothorax. The heart is normal in size; the mediastinal contour is within normal limits. No acute osseous abnormalities are seen. IMPRESSION: Vascular congestion noted.  Mild bibasilar atelectasis noted. Electronically Signed   By: Garald Balding M.D.   On: 06/16/2016 23:38    Procedures Procedures (including critical care time)  Medications Ordered in ED Medications  morphine 4 MG/ML injection 4 mg (4 mg Intravenous Given 06/17/16 0307)  ketorolac (TORADOL) 30 MG/ML injection 30  mg (30 mg Intravenous Given 06/17/16 0303)  methocarbamol (ROBAXIN) 1,000 mg in dextrose 5 % 50 mL IVPB (1,000 mg Intravenous New Bag/Given 06/17/16 0309)     Initial Impression / Assessment and Plan / ED Course  I have reviewed the triage vital signs and the nursing notes.  Pertinent labs that were available during my care of the patient were reviewed by me and considered in my medical decision making (see chart for details).  Clinical Course   Exacerbation of back pain. She has failed to respond to hydrocodone-acetaminophen. She's not been on any NSAIDs. She has not taken the muscle relaxer because she was concerned about taking too much medication. Old records are reviewed confirming recent hospitalization for back pain. 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 Neither of these are at critical size. She is given dose of ketorolac, morphine, and methocarbamol with excellent relief of symptoms. Since she does not want to take oral methocarbamol, she is given a prescription for tizanidine as well as naproxen and higher dose of hydrocodone-acetaminophen. Referred back to PCP for follow-up.  Final Clinical Impressions(s) / ED Diagnoses   Final diagnoses:  Acute exacerbation of chronic low back pain    New Prescriptions New Prescriptions   HYDROCODONE-ACETAMINOPHEN (NORCO) 7.5-325 MG TABLET    Take 1 tablet by mouth every 6 (six) hours as needed for moderate pain.   NAPROXEN (NAPROSYN) 375 MG TABLET    Take 1 tablet (375 mg total) by mouth 2 (two) times daily.   TIZANIDINE (ZANAFLEX) 4 MG TABLET    Take 1 tablet (4 mg total) by mouth every 6 (six) hours as needed for muscle spasms.    I personally performed the services described in this documentation, which was scribed in my presence. The recorded information has been reviewed and is accurate.      Delora Fuel, MD [phone] [id number]

## 2016-06-19 NOTE — Telephone Encounter (Signed)
lmtcb x1 for pt. 

## 2016-06-19 NOTE — Telephone Encounter (Signed)
I feel she still needs to be seen in her PCP's office for possible early herpetic neuralgia. I would recommend calling her PCP's office and see if she can speak directly to them today.

## 2016-06-19 NOTE — Telephone Encounter (Signed)
Called and spoke with pt and she stated that she called her PCP and she stated that they sent her to the ER.  She stated that they gave her morphine and 2 other drugs---then she stated that they discharged her.  Pt stated that they did give her 10 percocet to take at home and she stated that she does not want to go back to the hospital for the way they treated her.  She stated that they discharged her and put her in the waiting room for 3 hours until she could find a ride home.  JN any further recs for her pain in her back?  thanks

## 2016-06-19 NOTE — Telephone Encounter (Signed)
Spoke with pt. She is aware of JN's recommendation. Nothing further was needed.

## 2016-07-03 ENCOUNTER — Telehealth: Payer: Self-pay | Admitting: Endocrinology

## 2016-07-04 NOTE — Telephone Encounter (Signed)
She needs to be seen for her thyroid.  We will need to schedule her appointment in about 3-4 weeks.  If she is able to Do this will also need to schedule her thyroid ultrasound before her visit Please confirm

## 2016-07-10 ENCOUNTER — Other Ambulatory Visit: Payer: Self-pay | Admitting: Internal Medicine

## 2016-07-10 DIAGNOSIS — E0800 Diabetes mellitus due to underlying condition with hyperosmolarity without nonketotic hyperglycemic-hyperosmolar coma (NKHHC): Secondary | ICD-10-CM

## 2016-07-11 NOTE — Telephone Encounter (Signed)
LM for pt to call back to schedule appt.

## 2016-07-19 ENCOUNTER — Telehealth: Payer: Self-pay | Admitting: Internal Medicine

## 2016-07-19 ENCOUNTER — Encounter: Payer: Self-pay | Admitting: Pulmonary Disease

## 2016-07-19 ENCOUNTER — Ambulatory Visit (INDEPENDENT_AMBULATORY_CARE_PROVIDER_SITE_OTHER): Payer: Medicare Other | Admitting: Pulmonary Disease

## 2016-07-19 VITALS — BP 136/72 | HR 86 | Ht 64.0 in | Wt 165.4 lb

## 2016-07-19 DIAGNOSIS — J449 Chronic obstructive pulmonary disease, unspecified: Secondary | ICD-10-CM | POA: Diagnosis not present

## 2016-07-19 DIAGNOSIS — Z23 Encounter for immunization: Secondary | ICD-10-CM | POA: Diagnosis not present

## 2016-07-19 DIAGNOSIS — F17200 Nicotine dependence, unspecified, uncomplicated: Secondary | ICD-10-CM

## 2016-07-19 DIAGNOSIS — K219 Gastro-esophageal reflux disease without esophagitis: Secondary | ICD-10-CM

## 2016-07-19 MED ORDER — AEROCHAMBER MV MISC: 0 refills | Status: DC

## 2016-07-19 MED ORDER — BUDESONIDE-FORMOTEROL FUMARATE 160-4.5 MCG/ACT IN AERO: 2.0000 | INHALATION_SPRAY | Freq: Two times a day (BID) | RESPIRATORY_TRACT | 0 refills | Status: DC

## 2016-07-19 NOTE — Progress Notes (Signed)
Subjective:    Patient ID: Victoria Bird, female    DOB: 1946-06-16, 70 y.o.   MRN: [id number]  C.C.:  Follow-up with Severe COPD, OSA, GERD, & Tobacco Use Disoder.  HPI  Severe COPD:  On Spiriva. Previously on Advair & Symbicort. She reports she is continuing to have intermittent coughing & wheezing. She is frequently using her rescue inhaler and medication during the day. She will wake up at night occasionally wheezing. She denies any exacerbations since last appointment.   GERD:  On Protonix & follows with GI. She reports good control of her reflux with avoiding spicy foods. She still has to eat small meals.   Tobacco Use Disorder: Previously patient reports she has been unable to quit due to psychosocial stressors. She is continuing to smoke intermittently.   OSA: Last polysomnogram was 2010.  Previously declined CPAP therapy due to claustrophobia.  Review of Systems Still having pain in her shoulder radiating to her arm & hand. No fever, chills, or sweats. No headache or vision changes. Still having intermittent diarrhea.  Allergies  Allergen Reactions  . Aspartame And Phenylalanine Nausea And Vomiting    Patient says she is allergic to all artificial sweeteners  . Doxycycline Other (See Comments)    Nausea, vomiting, HA, double vision  . Tramadol Shortness Of Breath and Nausea Only  . Codeine Itching    Has not tried benadryl to alleviate side effects  . Neurontin [Gabapentin] Other (See Comments)    Dizzy, "drugged" feeling, sleepy  . Sulfa Antibiotics Other (See Comments)    Kidney problem   . Penicillins Rash    Has patient had a PCN reaction causing immediate rash, facial/tongue/throat swelling, SOB or lightheadedness with hypotension: No Has patient had a PCN reaction causing severe rash involving mucus membranes or skin necrosis: No Has patient had a PCN reaction that required hospitalization No Has patient had a PCN reaction occurring within the last 10 years:  No If all of the above answers are "NO", then may proceed with Cephalosporin use.  . Prednisone Rash    Patient stated she received Prednisone while she was in the hospital and experienced a rash and "extreme pain.'   Current Outpatient Prescriptions on File Prior to Visit  Medication Sig Dispense Refill  . albuterol (PROVENTIL HFA;VENTOLIN HFA) 108 (90 Base) MCG/ACT inhaler Inhale 1-2 puffs into the lungs every 4 (four) hours as needed. For shortness of breath. 1 Inhaler 3  . ALPRAZolam (XANAX) 0.5 MG tablet Take 1 tablet (0.5 mg total) by mouth at bedtime as needed for anxiety. 30 tablet 0  . ipratropium (ATROVENT) 0.02 % nebulizer solution Take 0.5 mg by nebulization every 4 (four) hours as needed for shortness of breath. As needed for SOB  11  . losartan-hydrochlorothiazide (HYZAAR) 100-12.5 MG tablet Take 1 tablet by mouth daily. 90 tablet 3  . metFORMIN (GLUCOPHAGE) 500 MG tablet TAKE 1 TABLET(500 MG) BY MOUTH TWICE DAILY WITH A MEAL 60 tablet 0  . pantoprazole (PROTONIX) 40 MG tablet Take 1 tablet (40 mg total) by mouth daily. 90 tablet 3  . tiotropium (SPIRIVA) 18 MCG inhalation capsule Place 1 capsule (18 mcg total) into inhaler and inhale daily. 30 capsule 0  . HYDROcodone-acetaminophen (NORCO) 7.5-325 MG tablet Take 1 tablet by mouth every 6 (six) hours as needed for moderate pain. (Patient not taking: Reported on 07/19/2016) 20 tablet 0  . tiZANidine (ZANAFLEX) 4 MG tablet Take 1 tablet (4 mg total) by mouth every 6 (six)  hours as needed for muscle spasms. (Patient not taking: Reported on 07/19/2016) 40 tablet 0   No current facility-administered medications on file prior to visit.    Past Medical History:  Diagnosis Date  . Anxiety   . Chronic pain   . Colon polyps    adenomatous  . COPD (chronic obstructive pulmonary disease) ([place])   . Diabetes mellitus   . Diverticulitis   . Diverticulosis   . Emphysema lung ([place])   . Gall stones   . GERD (gastroesophageal reflux  disease)   . H/O: pneumonia   . High cholesterol   . Hypertension   . OSA (obstructive sleep apnea)   . Tracheobronchitis 01/01/2012   Past Surgical History:  Procedure Laterality Date  . ABDOMINAL HYSTERECTOMY    . APPENDECTOMY    . Bilateral shoulder surgery    . CARPAL TUNNEL RELEASE Left   . CHOLECYSTECTOMY    . COLONOSCOPY    . ESOPHAGOGASTRODUODENOSCOPY    . FRACTURE SURGERY    . Right knee surgery    . ROTATOR CUFF REPAIR     Family History  Problem Relation Age of Onset  . Heart attack Mother   . Diabetes Maternal Grandfather   . Heart attack Maternal Grandfather   . Hypertension Maternal Grandfather   . Stroke Maternal Grandfather   . Heart attack Maternal Grandmother   . Hypertension Maternal Grandmother   . Alcohol abuse Father   . Thyroid disease Neg Hx   . Lung disease Neg Hx   . Colon cancer Neg Hx    Social History   Social History  . Marital status: Divorced    Spouse name: N/A  . Number of children: 3  . Years of education: GED   Occupational History  . Disability    Social History Main Topics  . Smoking status: Current Every Day Smoker    Packs/day: 1.50    Years: 41.00    Types: Cigarettes    Start date: 03/19/1974  . Smokeless tobacco: Never Used     Comment: daily amount of cigarettes varies depending on stress level   . Alcohol use No  . Drug use: No  . Sexual activity: No   Other Topics Concern  . None   Social History Narrative   Daughter Edmonia Lynch) [phone]   Originally from [place]. Previously lived in [place]. She has traveled from [name] to [place] & has also traveled across country to [place]. Multiple visits to [place], [place], [place], [place], [place]. She has a dog at home. No bird, mold, or hot tub exposure. She reports some years ago she did live in a home that had black mold in her closet. 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       Pt lives with her youngest daughter, lives in a trailer, does have stairs into home. Does have some troubled with that. Highest level of education is GED.       Objective:   Physical Exam BP 136/72 (BP Location: Right Arm, Cuff Size: Normal)   Pulse 86   Ht 5\' 4"  (1.626 m)   Wt 165 lb 6.4 oz (75 kg)   SpO2 96%   BMI 28.39 kg/m  General:  Awake. No distress. Alert.  Integument:  Warm & dry. No rash on exposed skin. HEENT:  Poor dentition. No nasal turbinate swelling. Moist membranes.  Cardiovascular:  Regular rate & rhythm. No edema. Normal S1 & S2. Pulmonary:  Clear with auscultation bilaterally. Good aeration bilaterally with mildly decreased symmetric breath sounds. Speaking in complete sentences. Abdomen: Soft. Normal bowel sounds. No mass appreciated.  PFT 01/28/16: FVC 2.31 L (77%) FEV1 1.16 L (81%) FEV1/FVC 0.50 FEF 25-75 0.47 L (24%) no bronchodilator response 06/09/15: FVC 2.21 L (73%) FEV1 1.08 L (47%) FEV1/FVC 0.49 FEF 25-75 0.43 L (22%) no bronchodilator response TLC 5.26 L (105%) RV 135% ERV 87% DLCO corrected 36% (Hgb 16.2)  6MWT 10/20/15:  Walked 240 meters / Baseline Sat 94% on RA / Nadir Sat 90% on RA 06/22/15:  3 laps around office. Baseline 93% on RA. Nadir Sat 89% on RA. 06/18/15:  Walked in hospital by PT / Baseline Sat 90% on RA / Nadir Sat 86% on RA (required 3 L/m on exertion to maintain)  IMAGING CXR PA/LAT 06/16/16 (personally reviewed by me):  No focal opacity. No effusion. Normal heart size. Normal mediastinal contour.   CXR PA/LAT 10/08/14 (previously reviewed by me): Mild persistent linear opacity consistent with scar at the right lung base. Kyphosis noted. Hyperinflation with some flattening of the diaphragm. No pleural effusion or thickening appreciated. No parenchymal opacity or nodule appreciated. Heart normal in size. Mediastinum: Contour.  CT CHEST W/ 04/16/02 (per radiologist): No evidence for pulmonary emboli. No pathologically  enlarged mediastinal or hilar lymph nodes. Enlarged lower pole of left thyroid lobe with a nodular configuration. Mild water density alveolitis with an anterior portion of left upper lobe & medial anterior portion of right upper lobe. No parenchymal nodules.   CARDIAC TTE (01/08/13): LV normal in size. EF 60-65%. Normal wall motion without regional abnormality. LA & RA normal in size. RV normal in size and function. Pulmonary artery systolic pressure 43 mmHg. Trivial aortic regurgitation. No mitral stenosis or regurgitation. No pulmonic or tricuspid regurgitation. No pericardial effusion.  LABS 05/27/15 Alpha-1 antitrypsin: 164  04/29/15 BMP: 140/4.2/102/24/16/1.01/93/9 LFT: 4/6.7/0.7/67/14/13  10/09/14 VBG (on room air): 7.39/37/59  03/24/13 ANA: Negative  RF:  <7.0    Assessment & Plan:  70 y.o. female with underlying severe COPD & tobacco use disorder.I again spoke to the patient for over 3 minutes and need for complete tobacco cessation to prevent worsening of her underlying lung function. Symptomatically I believe she would benefit from the addition of Symbicort to her regimen given the severity of her underlying COPD. I reviewed her chest x-ray from September which again shows no evidence for parenchymal opacity. I defer to her primary care physician in management of her chronic pain. I instructed the patient contact my office for any new breathing problems that should occur before her next appointment.  1. Severe COPD:  Continuing Spiriva. Giving sample of Symbicort 160/4.5 with spacer. She will call for a prescription if this is effective. Counseled on appropriate oral hygiene. 2. GERD:  Controlled with Protonix. No changes. 3. Tobacco Use Disorder: Spent over 3 minutes counseling the patient on the need for complete tobacco cessation. 4. Health Maintenance:  S/P Pneumovax April 2017 & Tdap April 2017. Plan for Prevnar Vaccine in April 2018. Administering high dose flu vaccine  today. 5. Follow-up: Return to clinic in 6 months or sooner if needed.   Sonia Baller Ashok Cordia, M.D. [facility] Pager:  [phone] After 3pm or if no response, call [phone] 3:19 PM 07/19/16

## 2016-07-19 NOTE — Patient Instructions (Addendum)
   Continue using your Spiriva as prescribed.  Use the Symbicort inhaler with a spacer - 2 puffs twice daily. Call me for a prescription if this seems to help your cough & breathing.  Make sure you rinse, gargle, brush your teeth, brush your tongue and spit afterward to keep from getting thrush.  Call me if you have any new breathing problems before your appointment in 6 months.

## 2016-07-19 NOTE — Telephone Encounter (Signed)
Patient came into the office today. Saying she needs refill on the pain medication the doctors gave her in the hospital. I informed her she needed to make a hospital fu appointment with Dr. Sharlet Salina. She states she has no way of getting here to come to an appointment. She did not seem to understand why we just could not give her a pain rx. She also stated that her doctor upstairs with pulmonary told her she didn't need to go to pain man. that Dr. Sharlet Salina would take over the pain medication. I do not see any notes in here about such thing. I informed her that Dr.Crawford was out of the office this afternoon and we would follow up with her the morning. Thank you.

## 2016-07-20 NOTE — Telephone Encounter (Signed)
Left message informing patient that she is going to have to call th office and schedule a hospital follow up visit before any medications can be prescribed.

## 2016-08-22 ENCOUNTER — Other Ambulatory Visit: Payer: Self-pay | Admitting: Internal Medicine

## 2016-08-22 DIAGNOSIS — E0800 Diabetes mellitus due to underlying condition with hyperosmolarity without nonketotic hyperglycemic-hyperosmolar coma (NKHHC): Secondary | ICD-10-CM

## 2016-09-02 ENCOUNTER — Other Ambulatory Visit: Payer: Self-pay | Admitting: Pulmonary Disease

## 2016-10-04 ENCOUNTER — Other Ambulatory Visit: Payer: Self-pay | Admitting: Internal Medicine

## 2016-10-04 DIAGNOSIS — E0800 Diabetes mellitus due to underlying condition with hyperosmolarity without nonketotic hyperglycemic-hyperosmolar coma (NKHHC): Secondary | ICD-10-CM

## 2016-10-16 ENCOUNTER — Telehealth: Payer: Self-pay | Admitting: Pulmonary Disease

## 2016-10-16 NOTE — Telephone Encounter (Signed)
Spoke with pt. And she stated she has switched insurance companies to [name]. She stated she received a letter stating that they will not cover her Proventil HFA. They gave an alternative for proair but pt. Does not want to switch to another inhaler, because she feels that this works well for her.  [facility] has faxed over the PA and it has been filled out and faxed. Will hold in triage to follow up on. The papers are sitting in the PA pending folder.

## 2016-10-17 MED ORDER — ALBUTEROL SULFATE HFA 108 (90 BASE) MCG/ACT IN AERS: 2.0000 | INHALATION_SPRAY | Freq: Four times a day (QID) | RESPIRATORY_TRACT | 5 refills | Status: DC | PRN

## 2016-10-17 NOTE — Telephone Encounter (Signed)
Called [facility] at [phone]. Pt's ID: [id number]. PA has been denied. Covered alternative is [employer].  Spoke with pt. She is aware of this information. States that she will go ahead and change inhalers. Rx for Proair has been sent in. Nothing further was needed.

## 2016-10-20 ENCOUNTER — Ambulatory Visit: Payer: Medicare Other | Admitting: Internal Medicine

## 2016-10-24 ENCOUNTER — Encounter: Payer: Self-pay | Admitting: Internal Medicine

## 2016-10-24 ENCOUNTER — Ambulatory Visit (INDEPENDENT_AMBULATORY_CARE_PROVIDER_SITE_OTHER): Payer: Medicare Other | Admitting: Internal Medicine

## 2016-10-24 ENCOUNTER — Other Ambulatory Visit (INDEPENDENT_AMBULATORY_CARE_PROVIDER_SITE_OTHER): Payer: Medicare Other

## 2016-10-24 VITALS — BP 160/82 | HR 86 | Temp 98.2°F | Resp 16 | Ht 64.0 in | Wt 167.0 lb

## 2016-10-24 DIAGNOSIS — E119 Type 2 diabetes mellitus without complications: Secondary | ICD-10-CM

## 2016-10-24 DIAGNOSIS — F17200 Nicotine dependence, unspecified, uncomplicated: Secondary | ICD-10-CM | POA: Diagnosis not present

## 2016-10-24 DIAGNOSIS — I10 Essential (primary) hypertension: Secondary | ICD-10-CM

## 2016-10-24 DIAGNOSIS — G894 Chronic pain syndrome: Secondary | ICD-10-CM

## 2016-10-24 DIAGNOSIS — F4323 Adjustment disorder with mixed anxiety and depressed mood: Secondary | ICD-10-CM

## 2016-10-24 LAB — CBC
HEMATOCRIT: 46.4 % — AB (ref 36.0–46.0)
Hemoglobin: 15.8 g/dL — ABNORMAL HIGH (ref 12.0–15.0)
MCHC: 34 g/dL (ref 30.0–36.0)
MCV: 84.5 fl (ref 78.0–100.0)
Platelets: 247 10*3/uL (ref 150.0–400.0)
RBC: 5.49 Mil/uL — ABNORMAL HIGH (ref 3.87–5.11)
RDW: 14.1 % (ref 11.5–15.5)
WBC: 10.1 10*3/uL (ref 4.0–10.5)

## 2016-10-24 LAB — COMPREHENSIVE METABOLIC PANEL
ALBUMIN: 4.1 g/dL (ref 3.5–5.2)
ALT: 15 U/L (ref 0–35)
AST: 14 U/L (ref 0–37)
Alkaline Phosphatase: 65 U/L (ref 39–117)
BILIRUBIN TOTAL: 0.8 mg/dL (ref 0.2–1.2)
BUN: 19 mg/dL (ref 6–23)
CALCIUM: 9.2 mg/dL (ref 8.4–10.5)
CHLORIDE: 105 meq/L (ref 96–112)
CO2: 30 meq/L (ref 19–32)
CREATININE: 1.21 mg/dL — AB (ref 0.40–1.20)
GFR: 46.81 mL/min — ABNORMAL LOW (ref >60.00)
Glucose, Bld: 102 mg/dL — ABNORMAL HIGH (ref 70–99)
Potassium: 4.2 mEq/L (ref 3.5–5.1)
SODIUM: 136 meq/L (ref 135–145)
Total Protein: 7.2 g/dL (ref 6.0–8.3)

## 2016-10-24 LAB — LIPID PANEL
CHOLESTEROL: 205 mg/dL — AB (ref 0–200)
HDL: 27 mg/dL — ABNORMAL LOW (ref >39.00)
NonHDL: 178.32
Total CHOL/HDL Ratio: 8
Triglycerides: 310 mg/dL — ABNORMAL HIGH (ref 0.0–149.0)
VLDL: 62 mg/dL — AB (ref 0.0–40.0)

## 2016-10-24 LAB — HEMOGLOBIN A1C: HEMOGLOBIN A1C: 5.7 % (ref 4.6–6.5)

## 2016-10-24 LAB — LDL CHOLESTEROL, DIRECT: LDL DIRECT: 109 mg/dL

## 2016-10-24 MED ORDER — HYDROCODONE-ACETAMINOPHEN 7.5-325 MG PO TABS: 1.0000 | ORAL_TABLET | ORAL | 0 refills | Status: DC | PRN

## 2016-10-24 NOTE — Progress Notes (Signed)
   Subjective:    Patient ID: Victoria Bird, female    DOB: Dec 27, 1945, 71 y.o.   MRN: [id number]  HPI The patient is a 71 YO female coming in for chronic pain in her body, especially her back. She has had this pain for many years. She had been taking opiates for many years. She is very disappointed as her lung doctor told her that he talked with me and that I would not send her to pain management. She is miserable trying to live her life without. We have only seen her once and prescribed gabapentin for her which she admitted she did not try. She then tried that medication and had some swelling so she stopped. She never called our office back to let us know this medication did not work. She also did not return for other options. She is able to walk small distances but is not able to do her ADLs etc due to pain with prolonged standing etc. She has right knee and hip problems and thinks she was told she had significant arthritis and might need replacement of one or both joint. She has not followed up with this in the last 5+ years.  She is also needing follow up of her other health conditions including her diabetes (taking metformin 500 mg BID still, on ARB no statin, controlled and no known complications, no eye exam in several years), and her blood pressure (taking losartan/hctz but not today and BP at goal at home, not known to be complicated). She has some other concerns which we are not able to get to with the limited visit time.   Review of Systems  Constitutional: Positive for activity change. Negative for appetite change, chills, fatigue, fever and unexpected weight change.  HENT: Negative.   Eyes: Negative.   Respiratory: Negative.   Cardiovascular: Negative for chest pain, palpitations and leg swelling.  Gastrointestinal: Negative for abdominal distention, abdominal pain, constipation, diarrhea, nausea and vomiting.  Musculoskeletal: Positive for arthralgias, back pain, gait problem and  myalgias. Negative for joint swelling, neck pain and neck stiffness.  Skin: Negative.   Neurological: Positive for weakness. Negative for dizziness, light-headedness, numbness and headaches.  Psychiatric/Behavioral: Positive for dysphoric mood. Negative for self-injury, sleep disturbance and suicidal ideas. The patient is not nervous/anxious.       Objective:   Physical Exam  Constitutional: She is oriented to person, place, and time. She appears well-developed and well-nourished.  HENT:  Head: Normocephalic and atraumatic.  Eyes: EOM are normal.  Neck: Normal range of motion.  Cardiovascular: Normal rate and regular rhythm.   Pulmonary/Chest: Effort normal. No respiratory distress. She has no wheezes.  Abdominal: Soft. She exhibits no distension. There is no tenderness. There is no rebound.  Musculoskeletal: She exhibits no edema.  Neurological: She is alert and oriented to person, place, and time. Coordination abnormal.  Slow gait  Skin: Skin is warm and dry.   Vitals:   10/24/16 1255  BP: (!) 160/82  Pulse: 86  Resp: 16  Temp: 98.2 F (36.8 C)  TempSrc: Oral  SpO2: 92%  Weight: 167 lb (75.8 kg)  Height: 5\' 4"  (1.626 m)      Assessment & Plan:

## 2016-10-24 NOTE — Progress Notes (Signed)
Pre visit review using our clinic review tool, if applicable. No additional management support is needed unless otherwise documented below in the visit note. 

## 2016-10-24 NOTE — Patient Instructions (Signed)
We have given you hydrocodone prescription today and will get you in with the pain clinic.  We will check the labs today.

## 2016-10-26 NOTE — Assessment & Plan Note (Signed)
She was not willing to discuss quitting today although she knows it is worsening her lungs.

## 2016-10-26 NOTE — Assessment & Plan Note (Addendum)
Have explained to her that I did not talk to her pulmonary doctor and that we have no problem referring her to pain management. Talked to her about returning to orthopedics for further evaluation of her knee and hip which is giving out on her and she declines. Also encouraged her if she is having a bad reaction to medication we cannot know if she does not tell us and if she does not return for follow up we assume she is doing well. She is given norco rx for 5 day supply and discussed the opiate regulations that this is the longest supply we are legally allowed to give her and that she would need to communicate with [place]. Referral to pain management placed per her request.

## 2016-10-26 NOTE — Assessment & Plan Note (Signed)
Checking HgA1c today, reminded about eye exam. Given her other more serious concerns no foot exam done today and she will return for that. Checking lipid panel as well as she is not on statin. Adjust as needed her metformin 500 mg BID.

## 2016-10-26 NOTE — Assessment & Plan Note (Signed)
She again does not want medication although we discussed cymbalta which may also help with her pain.

## 2016-10-26 NOTE — Assessment & Plan Note (Signed)
BP above goal and she admits no meds this morning and does not want to make changes. Checking CMP for complications.

## 2016-11-02 ENCOUNTER — Ambulatory Visit (INDEPENDENT_AMBULATORY_CARE_PROVIDER_SITE_OTHER): Payer: Medicare Other | Admitting: Endocrinology

## 2016-11-02 ENCOUNTER — Encounter: Payer: Self-pay | Admitting: Endocrinology

## 2016-11-02 VITALS — BP 138/70 | HR 82 | Ht 64.0 in | Wt 163.0 lb

## 2016-11-02 DIAGNOSIS — E042 Nontoxic multinodular goiter: Secondary | ICD-10-CM | POA: Diagnosis not present

## 2016-11-02 LAB — TSH: TSH: 1 u[IU]/mL (ref 0.35–4.50)

## 2016-11-02 LAB — T4, FREE: FREE T4: 0.87 ng/dL (ref 0.60–1.60)

## 2016-11-02 NOTE — Progress Notes (Signed)
Patient ID: Victoria Bird, female   DOB: 07-10-1946, 71 y.o.   MRN: [id number]    Reason for Appointment: Follow-up of thyroid nodule    History of Present Illness:   The patient's thyroid enlargement was first discovered in 06/2014 when she was having a CT scan of her neck.  She  had a thyroid ultrasound showing the following: Dominant left nodule is inferior measuring 3.4 cm x 3.4 cm x 4.8 cm.  Aspiration biopsy of the nodule in 6/16 showed atypia of undetermined significance/follicular lesion, BETHESDA category 3 but also had abundant colloid Although she was referred for surgery this appointment was delayed significantly and she was reluctant to have surgery also  She came for follow-up several months later and it was decided to recheck her ultrasound rather than repeat biopsy  The ultrasound done in 08/2015 showed the following:  Similar findings of multi nodular goiter. No new or enlarging thyroid nodules.  Specifically, the dominant previously biopsied approximately 4.8 cm nodule / mass within the inferior aspect of the left lobe of the thyroid is unchanged since the 01/2015 examination.  Has had normal thyroid levels   Lab Results  Component Value Date   TSH 2.21 01/21/2016   TSH 2.133 01/07/2013    She is now coming back for follow-up Currently does not complain of any difficulty swallowing or choking and no difficulty with breathing in any position   DIABETES:   She has a history of mild Diabetes which has been well controlled and followed by PCP   Currently she is on  Metformin 500 twice a day     Lab Results  Component Value Date   HGBA1C 5.7 10/24/2016   HGBA1C 5.8 (H) 12/13/2015   HGBA1C 5.7 07/23/2015   Lab Results  Component Value Date   MICROALBUR <0.7 07/23/2015   CREATININE 1.21 (H) 10/24/2016      Allergies as of 11/02/2016      Reactions   Aspartame And Phenylalanine Nausea And Vomiting   Patient says she is allergic to all  artificial sweeteners   Doxycycline Other (See Comments)   Nausea, vomiting, HA, double vision   Tramadol Shortness Of Breath, Nausea Only   Codeine Itching   Has not tried benadryl to alleviate side effects   Neurontin [gabapentin] Other (See Comments)   Dizzy, "drugged" feeling, sleepy   Sulfa Antibiotics Other (See Comments)   Kidney problem    Penicillins Rash   Has patient had a PCN reaction causing immediate rash, facial/tongue/throat swelling, SOB or lightheadedness with hypotension: No Has patient had a PCN reaction causing severe rash involving mucus membranes or skin necrosis: No Has patient had a PCN reaction that required hospitalization No Has patient had a PCN reaction occurring within the last 10 years: No If all of the above answers are "NO", then may proceed with Cephalosporin use.   Prednisone Rash   Patient stated she received Prednisone while she was in the hospital and experienced a rash and "extreme pain.'      Medication List       Accurate as of 11/02/16  1:45 PM. Always use your most recent med list.          AEROCHAMBER MV inhaler Use as instructed   albuterol 108 (90 Base) MCG/ACT inhaler Commonly known as:  PROAIR HFA Inhale 2 puffs into the lungs every 6 (six) hours as needed for wheezing or shortness of breath.   budesonide-formoterol 160-4.5 MCG/ACT inhaler Commonly known as:  SYMBICORT Inhale 2 puffs into the lungs 2 (two) times daily.   HYDROcodone-acetaminophen 7.5-325 MG tablet Commonly known as:  NORCO Take 1 tablet by mouth every 4 (four) hours as needed for moderate pain.   ipratropium 0.02 % nebulizer solution Commonly known as:  ATROVENT Take 0.5 mg by nebulization every 4 (four) hours as needed for shortness of breath. As needed for SOB   losartan-hydrochlorothiazide 100-12.5 MG tablet Commonly known as:  HYZAAR Take 1 tablet by mouth daily.   metFORMIN 500 MG tablet Commonly known as:  GLUCOPHAGE Take 1 tablet (500 mg total)  by mouth 2 (two) times daily with a meal.   pantoprazole 40 MG tablet Commonly known as:  PROTONIX Take 1 tablet (40 mg total) by mouth daily.   SPIRIVA HANDIHALER 18 MCG inhalation capsule Generic drug:  tiotropium PLACE 1 CAPSULE INTO INHALER AND INHALE DAILY   tiZANidine 4 MG tablet Commonly known as:  ZANAFLEX Take 1 tablet (4 mg total) by mouth every 6 (six) hours as needed for muscle spasms.       Allergies:  Allergies  Allergen Reactions  . Aspartame And Phenylalanine Nausea And Vomiting    Patient says she is allergic to all artificial sweeteners  . Doxycycline Other (See Comments)    Nausea, vomiting, HA, double vision  . Tramadol Shortness Of Breath and Nausea Only  . Codeine Itching    Has not tried benadryl to alleviate side effects  . Neurontin [Gabapentin] Other (See Comments)    Dizzy, "drugged" feeling, sleepy  . Sulfa Antibiotics Other (See Comments)    Kidney problem   . Penicillins Rash    Has patient had a PCN reaction causing immediate rash, facial/tongue/throat swelling, SOB or lightheadedness with hypotension: No Has patient had a PCN reaction causing severe rash involving mucus membranes or skin necrosis: No Has patient had a PCN reaction that required hospitalization No Has patient had a PCN reaction occurring within the last 10 years: No If all of the above answers are "NO", then may proceed with Cephalosporin use.  . Prednisone Rash    Patient stated she received Prednisone while she was in the hospital and experienced a rash and "extreme pain.'    Past Medical History:  Diagnosis Date  . Anxiety   . Chronic pain   . Colon polyps    adenomatous  . COPD (chronic obstructive pulmonary disease) ([place])   . Diabetes mellitus   . Diverticulitis   . Diverticulosis   . Emphysema lung ([place])   . Gall stones   . GERD (gastroesophageal reflux disease)   . H/O: pneumonia   . High cholesterol   . Hypertension   . OSA (obstructive sleep apnea)   .  Tracheobronchitis 01/01/2012    Past Surgical History:  Procedure Laterality Date  . ABDOMINAL HYSTERECTOMY    . APPENDECTOMY    . Bilateral shoulder surgery    . CARPAL TUNNEL RELEASE Left   . CHOLECYSTECTOMY    . COLONOSCOPY    . ESOPHAGOGASTRODUODENOSCOPY    . FRACTURE SURGERY    . Right knee surgery    . ROTATOR CUFF REPAIR      Family History  Problem Relation Age of Onset  . Heart attack Mother   . Diabetes Maternal Grandfather   . Heart attack Maternal Grandfather   . Hypertension Maternal Grandfather   . Stroke Maternal Grandfather   . Heart attack Maternal Grandmother   . Hypertension Maternal Grandmother   . Alcohol abuse Father   .  Thyroid disease Neg Hx   . Lung disease Neg Hx   . Colon cancer Neg Hx     Social History:  reports that she has been smoking Cigarettes.  She started smoking about 42 years ago. She has a 61.50 pack-year smoking history. She has never used smokeless tobacco. She reports that she does not drink alcohol or use drugs.    ROS            Examination:   BP 138/70   Pulse 82   Ht 5\' 4"  (1.626 m)   Wt 163 lb (73.9 kg)   SpO2 90%   BMI 27.98 kg/m             The thyroid is not clearly enlarged, neither side shows a palpable nodule even on swallowing Mindfulness present on the right side of the lower neck  No lymphadenopathy in the neck Biceps reflexes appear normal  Assessment/Plan:   Left-sided 4.8 cm nodule which showed atypia on biopsy in 6/16 Follow-up ultrasound previously showed no change in her nodule over about 7 months She is symptomatic Again her  nodule is difficult to palpate clinically   She does need another ultrasound for follow-up since it has been more than a year since the last exam Further decision regarding biopsy will be done depending on the size of her nodules  She will have follow-up thyroid levels done today also   [facility] 11/02/2016

## 2016-11-08 ENCOUNTER — Other Ambulatory Visit: Payer: Medicare Other

## 2016-11-10 ENCOUNTER — Ambulatory Visit
Admission: RE | Admit: 2016-11-10 | Discharge: 2016-11-10 | Disposition: A | Discharge status: Home / Self Care | Payer: Medicare Other | Source: Ambulatory Visit | Attending: Endocrinology | Admitting: Endocrinology

## 2016-11-10 DIAGNOSIS — E042 Nontoxic multinodular goiter: Secondary | ICD-10-CM

## 2016-11-12 NOTE — Progress Notes (Signed)
Please let patient know that the ultrasound shows no nodules to be the same or slightly smaller, no further evaluation needed, can check this again in 1 year

## 2016-11-13 ENCOUNTER — Telehealth: Payer: Self-pay

## 2016-11-13 NOTE — Telephone Encounter (Signed)
-----   Message from Elayne Snare, MD sent at 11/12/2016  4:03 PM EST ----- Please let patient know that the ultrasound shows no nodules to be the same or slightly smaller, no further evaluation needed, can check this again in 1 year

## 2016-11-13 NOTE — Telephone Encounter (Signed)
LVM, gave lab results. Gave call back number if any questions or concerns.  

## 2016-11-20 ENCOUNTER — Telehealth: Payer: Self-pay

## 2016-11-20 ENCOUNTER — Telehealth: Payer: Self-pay | Admitting: Endocrinology

## 2016-11-20 NOTE — Telephone Encounter (Signed)
Pt called and said that she received a call from [place], she said she didn't know who it was and wanted to check.

## 2016-11-20 NOTE — Telephone Encounter (Signed)
Patient called and stated a Dr. Posey Pronto called her cell phone this morning after her endocrinologist had recommended a pain specialist last week, is unsure if the call came from our office or not, please advise

## 2016-11-20 NOTE — Telephone Encounter (Signed)
I did not reach out to her.  Sorry.

## 2016-12-01 ENCOUNTER — Encounter: Payer: Self-pay | Admitting: Internal Medicine

## 2016-12-01 DIAGNOSIS — H2513 Age-related nuclear cataract, bilateral: Secondary | ICD-10-CM | POA: Diagnosis not present

## 2016-12-01 DIAGNOSIS — E119 Type 2 diabetes mellitus without complications: Secondary | ICD-10-CM | POA: Diagnosis not present

## 2016-12-01 LAB — HM DIABETES EYE EXAM

## 2016-12-15 ENCOUNTER — Encounter (HOSPITAL_COMMUNITY): Payer: Self-pay

## 2016-12-15 ENCOUNTER — Inpatient Hospital Stay (HOSPITAL_COMMUNITY)
Admission: EM | Admit: 2016-12-15 | Discharge: 2016-12-19 | DRG: 190 | Disposition: A | Discharge status: Home / Self Care | Payer: Medicare Other | Attending: Internal Medicine | Admitting: Internal Medicine

## 2016-12-15 ENCOUNTER — Emergency Department (HOSPITAL_COMMUNITY): Payer: Medicare Other

## 2016-12-15 DIAGNOSIS — Z880 Allergy status to penicillin: Secondary | ICD-10-CM

## 2016-12-15 DIAGNOSIS — M545 Low back pain, unspecified: Secondary | ICD-10-CM

## 2016-12-15 DIAGNOSIS — J9601 Acute respiratory failure with hypoxia: Secondary | ICD-10-CM | POA: Diagnosis present

## 2016-12-15 DIAGNOSIS — I129 Hypertensive chronic kidney disease with stage 1 through stage 4 chronic kidney disease, or unspecified chronic kidney disease: Secondary | ICD-10-CM | POA: Diagnosis present

## 2016-12-15 DIAGNOSIS — G8929 Other chronic pain: Secondary | ICD-10-CM | POA: Diagnosis not present

## 2016-12-15 DIAGNOSIS — Z888 Allergy status to other drugs, medicaments and biological substances status: Secondary | ICD-10-CM | POA: Diagnosis not present

## 2016-12-15 DIAGNOSIS — Z86010 Personal history of colonic polyps: Secondary | ICD-10-CM

## 2016-12-15 DIAGNOSIS — R0902 Hypoxemia: Secondary | ICD-10-CM | POA: Diagnosis not present

## 2016-12-15 DIAGNOSIS — F17210 Nicotine dependence, cigarettes, uncomplicated: Secondary | ICD-10-CM | POA: Diagnosis present

## 2016-12-15 DIAGNOSIS — I714 Abdominal aortic aneurysm, without rupture, unspecified: Secondary | ICD-10-CM | POA: Diagnosis present

## 2016-12-15 DIAGNOSIS — E119 Type 2 diabetes mellitus without complications: Secondary | ICD-10-CM | POA: Diagnosis not present

## 2016-12-15 DIAGNOSIS — E1121 Type 2 diabetes mellitus with diabetic nephropathy: Secondary | ICD-10-CM | POA: Diagnosis not present

## 2016-12-15 DIAGNOSIS — R109 Unspecified abdominal pain: Secondary | ICD-10-CM

## 2016-12-15 DIAGNOSIS — I10 Essential (primary) hypertension: Secondary | ICD-10-CM | POA: Diagnosis present

## 2016-12-15 DIAGNOSIS — J441 Chronic obstructive pulmonary disease with (acute) exacerbation: Secondary | ICD-10-CM | POA: Diagnosis not present

## 2016-12-15 DIAGNOSIS — Z885 Allergy status to narcotic agent status: Secondary | ICD-10-CM | POA: Diagnosis not present

## 2016-12-15 DIAGNOSIS — E1122 Type 2 diabetes mellitus with diabetic chronic kidney disease: Secondary | ICD-10-CM | POA: Diagnosis present

## 2016-12-15 DIAGNOSIS — J9622 Acute and chronic respiratory failure with hypercapnia: Secondary | ICD-10-CM

## 2016-12-15 DIAGNOSIS — G4733 Obstructive sleep apnea (adult) (pediatric): Secondary | ICD-10-CM | POA: Diagnosis present

## 2016-12-15 DIAGNOSIS — E7800 Pure hypercholesterolemia, unspecified: Secondary | ICD-10-CM | POA: Diagnosis not present

## 2016-12-15 DIAGNOSIS — K219 Gastro-esophageal reflux disease without esophagitis: Secondary | ICD-10-CM | POA: Diagnosis present

## 2016-12-15 DIAGNOSIS — F17200 Nicotine dependence, unspecified, uncomplicated: Secondary | ICD-10-CM | POA: Diagnosis not present

## 2016-12-15 DIAGNOSIS — N183 Chronic kidney disease, stage 3 unspecified: Secondary | ICD-10-CM | POA: Diagnosis present

## 2016-12-15 DIAGNOSIS — E1129 Type 2 diabetes mellitus with other diabetic kidney complication: Secondary | ICD-10-CM | POA: Diagnosis not present

## 2016-12-15 DIAGNOSIS — Z720 Tobacco use: Secondary | ICD-10-CM | POA: Diagnosis not present

## 2016-12-15 DIAGNOSIS — K529 Noninfective gastroenteritis and colitis, unspecified: Secondary | ICD-10-CM | POA: Diagnosis not present

## 2016-12-15 DIAGNOSIS — R05 Cough: Secondary | ICD-10-CM | POA: Diagnosis not present

## 2016-12-15 DIAGNOSIS — T380X5A Adverse effect of glucocorticoids and synthetic analogues, initial encounter: Secondary | ICD-10-CM | POA: Diagnosis not present

## 2016-12-15 DIAGNOSIS — J9621 Acute and chronic respiratory failure with hypoxia: Secondary | ICD-10-CM | POA: Diagnosis not present

## 2016-12-15 DIAGNOSIS — J9620 Acute and chronic respiratory failure, unspecified whether with hypoxia or hypercapnia: Secondary | ICD-10-CM | POA: Diagnosis not present

## 2016-12-15 DIAGNOSIS — F419 Anxiety disorder, unspecified: Secondary | ICD-10-CM | POA: Diagnosis present

## 2016-12-15 DIAGNOSIS — K5730 Diverticulosis of large intestine without perforation or abscess without bleeding: Secondary | ICD-10-CM | POA: Diagnosis not present

## 2016-12-15 DIAGNOSIS — R0602 Shortness of breath: Secondary | ICD-10-CM | POA: Diagnosis not present

## 2016-12-15 DIAGNOSIS — N058 Unspecified nephritic syndrome with other morphologic changes: Secondary | ICD-10-CM | POA: Diagnosis not present

## 2016-12-15 LAB — I-STAT TROPONIN, ED: Troponin i, poc: 0 ng/mL (ref 0.00–0.08)

## 2016-12-15 LAB — CBC
HEMATOCRIT: 42.9 % (ref 36.0–46.0)
Hemoglobin: 14 g/dL (ref 12.0–15.0)
MCH: 28.3 pg (ref 26.0–34.0)
MCHC: 32.6 g/dL (ref 30.0–36.0)
MCV: 86.7 fL (ref 78.0–100.0)
PLATELETS: 302 10*3/uL (ref 150–400)
RBC: 4.95 MIL/uL (ref 3.87–5.11)
RDW: 13.7 % (ref 11.5–15.5)
WBC: 11.7 10*3/uL — ABNORMAL HIGH (ref 4.0–10.5)

## 2016-12-15 LAB — BASIC METABOLIC PANEL
Anion gap: 8 (ref 5–15)
BUN: 22 mg/dL — AB (ref 6–20)
CO2: 29 mmol/L (ref 22–32)
CREATININE: 1.26 mg/dL — AB (ref 0.44–1.00)
Calcium: 9.2 mg/dL (ref 8.9–10.3)
Chloride: 104 mmol/L (ref 101–111)
GFR calc Af Amer: 49 mL/min — ABNORMAL LOW (ref >60)
GFR calc non Af Amer: 42 mL/min — ABNORMAL LOW (ref >60)
Glucose, Bld: 103 mg/dL — ABNORMAL HIGH (ref 65–99)
POTASSIUM: 3.7 mmol/L (ref 3.5–5.1)
Sodium: 141 mmol/L (ref 135–145)

## 2016-12-15 MED ORDER — IPRATROPIUM-ALBUTEROL 0.5-2.5 (3) MG/3ML IN SOLN
3.0000 mL | RESPIRATORY_TRACT | Status: AC
  Administered 2016-12-15 (×3): 3 mL via RESPIRATORY_TRACT
  Filled 2016-12-15: qty 9

## 2016-12-15 MED ORDER — MAGNESIUM SULFATE 2 GM/50ML IV SOLN
2.0000 g | Freq: Once | INTRAVENOUS | Status: AC
  Administered 2016-12-15: 2 g via INTRAVENOUS
  Filled 2016-12-15: qty 50

## 2016-12-15 MED ORDER — METHYLPREDNISOLONE SODIUM SUCC 125 MG IJ SOLR
125.0000 mg | Freq: Once | INTRAMUSCULAR | Status: AC
  Administered 2016-12-15: 125 mg via INTRAVENOUS
  Filled 2016-12-15: qty 2

## 2016-12-15 NOTE — ED Provider Notes (Signed)
[facility] DEPT Provider Note   CSN: [id number] Arrival date & time: 12/15/16  1859     History   Chief Complaint Chief Complaint  Patient presents with  . Shortness of Breath    HPI Victoria Bird is a 71 y.o. female.  71 yo F with a chief complaints of shortness breath. This been going on for about a week. Associated with cough congestion some muscle aches. Patient feels that she likely had fevers but has not checked it at home. Denies lower extremity edema. She has been smoking but less than normal. She feels like this is similar to her prior COPD.   The history is provided by the patient.  Shortness of Breath  This is a new problem. The average episode lasts 1 week. The problem occurs continuously.The current episode started more than 1 week ago. The problem has not changed since onset.Associated symptoms include cough. Pertinent negatives include no fever, no headaches, no rhinorrhea, no wheezing, no chest pain and no vomiting. She has tried nothing for the symptoms. The treatment provided no relief. She has had prior hospitalizations. She has had prior ED visits. Associated medical issues include COPD.    Past Medical History:  Diagnosis Date  . Anxiety   . Chronic pain   . Colon polyps    adenomatous  . COPD (chronic obstructive pulmonary disease) ([place])   . Diabetes mellitus   . Diverticulitis   . Diverticulosis   . Emphysema lung ([place])   . Gall stones   . GERD (gastroesophageal reflux disease)   . H/O: pneumonia   . High cholesterol   . Hypertension   . OSA (obstructive sleep apnea)   . Tracheobronchitis 01/01/2012    Patient Active Problem List   Diagnosis Date Noted  . AAA (abdominal aortic aneurysm) without rupture ([place]) 06/08/2016  . Right-sided low back pain without sciatica   . COPD with exacerbation ([place]) 03/10/2016  . Adjustment disorder with mixed anxiety and depressed mood 02/25/2016  . Overweight (BMI 25.0-29.9) 01/25/2016  . Allergic  rhinitis 11/17/2015  . Tobacco use disorder 06/22/2015  . Diabetes mellitus type 2, controlled ([place]) 06/16/2015  . H/O recurrent pneumonia 05/27/2015  . Chronic respiratory failure ([place]) 05/27/2015  . GERD (gastroesophageal reflux disease) 05/27/2015  . Hyperlipidemia 05/27/2015  . Chronic pain syndrome 05/27/2015  . Thyroid nodule 01/20/2015  . COPD (chronic obstructive pulmonary disease) ([place]) 10/09/2014  . Hypertension 12/14/2011  . OSA (obstructive sleep apnea) 10/02/2008    Past Surgical History:  Procedure Laterality Date  . ABDOMINAL HYSTERECTOMY    . APPENDECTOMY    . Bilateral shoulder surgery    . CARPAL TUNNEL RELEASE Left   . CHOLECYSTECTOMY    . COLONOSCOPY    . ESOPHAGOGASTRODUODENOSCOPY    . FRACTURE SURGERY    . Right knee surgery    . ROTATOR CUFF REPAIR      OB History    No data available       Home Medications    Prior to Admission medications   Medication Sig Start Date End Date Taking? Authorizing Provider  albuterol (PROAIR HFA) 108 (90 Base) MCG/ACT inhaler Inhale 2 puffs into the lungs every 6 (six) hours as needed for wheezing or shortness of breath. 10/17/16  Yes Javier Glazier, MD  ipratropium (ATROVENT) 0.02 % nebulizer solution Take 0.5 mg by nebulization every 4 (four) hours as needed for shortness of breath. As needed for SOB 03/28/16  Yes Historical Provider, MD  losartan-hydrochlorothiazide (HYZAAR) 100-12.5  MG tablet Take 1 tablet by mouth daily. 02/25/16  Yes Hoyt Koch, MD  metFORMIN (GLUCOPHAGE) 500 MG tablet Take 1 tablet (500 mg total) by mouth 2 (two) times daily with a meal. 10/04/16  Yes Hoyt Koch, MD  pantoprazole (PROTONIX) 40 MG tablet Take 1 tablet (40 mg total) by mouth daily. 02/25/16  Yes Hoyt Koch, MD  SPIRIVA HANDIHALER 18 MCG inhalation capsule PLACE 1 CAPSULE INTO INHALER AND INHALE DAILY 09/04/16  Yes Javier Glazier, MD  budesonide-formoterol [facility]) 160-4.5 MCG/ACT inhaler Inhale 2  puffs into the lungs 2 (two) times daily. Patient not taking: Reported on 12/15/2016 07/19/16   Javier Glazier, MD  HYDROcodone-acetaminophen [facility]) 7.5-325 MG tablet Take 1 tablet by mouth every 4 (four) hours as needed for moderate pain. Patient not taking: Reported on 12/15/2016 10/24/16   Hoyt Koch, MD  Spacer/Aero-Holding Chambers (AEROCHAMBER MV) inhaler Use as instructed 07/19/16   Javier Glazier, MD  tiZANidine (ZANAFLEX) 4 MG tablet Take 1 tablet (4 mg total) by mouth every 6 (six) hours as needed for muscle spasms. Patient not taking: Reported on 12/15/2016 2/77/41   Delora Fuel, MD    Family History Family History  Problem Relation Age of Onset  . Heart attack Mother   . Diabetes Maternal Grandfather   . Heart attack Maternal Grandfather   . Hypertension Maternal Grandfather   . Stroke Maternal Grandfather   . Heart attack Maternal Grandmother   . Hypertension Maternal Grandmother   . Alcohol abuse Father   . Thyroid disease Neg Hx   . Lung disease Neg Hx   . Colon cancer Neg Hx     Social History Social History  Substance Use Topics  . Smoking status: Current Every Day Smoker    Packs/day: 1.50    Years: 41.00    Types: Cigarettes    Start date: 03/19/1974  . Smokeless tobacco: Never Used     Comment: daily amount of cigarettes varies depending on stress level   . Alcohol use No     Allergies   Aspartame and phenylalanine; Doxycycline; Tramadol; Codeine; Neurontin [gabapentin]; Sulfa antibiotics; Symbicort [budesonide-formoterol fumarate]; Penicillins; and Prednisone   Review of Systems Review of Systems  Constitutional: Negative for chills and fever.  HENT: Positive for congestion. Negative for rhinorrhea.   Eyes: Negative for redness and visual disturbance.  Respiratory: Positive for cough and shortness of breath. Negative for wheezing.   Cardiovascular: Negative for chest pain and palpitations.  Gastrointestinal: Negative for nausea and  vomiting.  Genitourinary: Negative for dysuria and urgency.  Musculoskeletal: Negative for arthralgias and myalgias.  Skin: Negative for pallor and wound.  Neurological: Negative for dizziness and headaches.     Physical Exam Updated Vital Signs BP 133/73 (BP Location: Left Arm)   Pulse 88   Temp 98.3 F (36.8 C) (Oral)   Resp (!) 22   Ht 5\' 3"  (1.6 m)   Wt 167 lb (75.8 kg)   SpO2 92%   BMI 29.58 kg/m   Physical Exam  Constitutional: She is oriented to person, place, and time. She appears well-developed and well-nourished. No distress.  HENT:  Head: Normocephalic and atraumatic.  Eyes: EOM are normal. Pupils are equal, round, and reactive to light.  Neck: Normal range of motion. Neck supple.  Cardiovascular: Normal rate and regular rhythm.  Exam reveals no gallop and no friction rub.   No murmur heard. Pulmonary/Chest: Effort normal. She has no wheezes. She has no rales.  Diminished breath sounds in all fields.   Abdominal: Soft. She exhibits no distension. There is no tenderness.  Musculoskeletal: She exhibits no edema or tenderness.  Neurological: She is alert and oriented to person, place, and time.  Skin: Skin is warm and dry. She is not diaphoretic.  Psychiatric: She has a normal mood and affect. Her behavior is normal.  Nursing note and vitals reviewed.    ED Treatments / Results  Labs (all labs ordered are listed, but only abnormal results are displayed) Labs Reviewed  BASIC METABOLIC PANEL - Abnormal; Notable for the following:       Result Value   Glucose, Bld 103 (*)    BUN 22 (*)    Creatinine, Ser 1.26 (*)    GFR calc non Af Amer 42 (*)    GFR calc Af Amer 49 (*)    All other components within normal limits  CBC - Abnormal; Notable for the following:    WBC 11.7 (*)    All other components within normal limits  I-STAT TROPOININ, ED    EKG  EKG Interpretation  Date/Time:  Friday December 15 2016 22:22:23 EDT Ventricular Rate:  68 PR Interval:      QRS Duration: 77 QT Interval:  418 QTC Calculation: 445 R Axis:   57 Text Interpretation:  Sinus rhythm ST elevation, consider inferior injury No significant change since last tracing Confirmed by Enslie Sahota MD, DANIEL [phone]) on 12/15/2016 10:39:33 PM       Radiology Dg Chest 2 View  Result Date: 12/15/2016 CLINICAL DATA:  Shortness of breath and cough EXAM: CHEST  2 VIEW COMPARISON:  06/16/2016 FINDINGS: There is hyperinflation. Coarse interstitial opacities at the bilateral lung bases, slightly increased, could represent bronchial inflammation. No consolidation or effusion. Stable cardiomediastinal silhouette. No pneumothorax. Mild mid thoracic wedging deformities. Surgical clips in the right upper quadrant. IMPRESSION: Mild interstitial opacities at the lung bases could reflect bronchial inflammation. There is no focal pulmonary infiltrate. Electronically Signed   By: Donavan Foil M.D.   On: 12/15/2016 22:15    Procedures Procedures (including critical care time)  Medications Ordered in ED Medications  magnesium sulfate IVPB 2 g 50 mL (not administered)  methylPREDNISolone sodium succinate (SOLU-MEDROL) 125 mg/2 mL injection 125 mg (not administered)  ipratropium-albuterol (DUONEB) 0.5-2.5 (3) MG/3ML nebulizer solution 3 mL (3 mLs Nebulization Given 12/15/16 2243)     Initial Impression / Assessment and Plan / ED Course  I have reviewed the triage vital signs and the nursing notes.  Pertinent labs & imaging results that were available during my care of the patient were reviewed by me and considered in my medical decision making (see chart for details).     71 yo F With a chief complaint of shortness of breath. This been going on for the past week. On arrival the patient was hypoxic into the mid 80s and had a placed on oxygen. Chest x-ray without overt pneumonia. Mild lung exam is consistent with COPD. Will give magnesium Solu-Medrol and DuoNeb's and reassess.  On my reassessment  patient is moving better air, she did not feel that she is breathing much better at all. Taken off oxygen and still hypoxic. We'll discuss with the hospitalist.  The patients results and plan were reviewed and discussed.   Any x-rays performed were independently reviewed by myself.   Differential diagnosis were considered with the presenting HPI.  Medications  ipratropium-albuterol (DUONEB) 0.5-2.5 (3) MG/3ML nebulizer solution 3 mL (3 mLs Nebulization Given 12/15/16  2243)  magnesium sulfate IVPB 2 g 50 mL (2 g Intravenous New Bag/Given 12/15/16 2317)  methylPREDNISolone sodium succinate (SOLU-MEDROL) 125 mg/2 mL injection 125 mg (125 mg Intravenous Given 12/15/16 2317)    Vitals:   12/15/16 2239 12/15/16 2242 12/15/16 2243 12/15/16 2324  BP:    (!) 135/47  Pulse:    83  Resp:    (!) 21  Temp:      TempSrc:      SpO2: 92% 92% 92% 91%  Weight:      Height:        Final diagnoses:  COPD exacerbation ([name])    Admission/ observation were discussed with the admitting physician, patient and/or family and they are comfortable with the plan.    Final Clinical Impressions(s) / ED Diagnoses   Final diagnoses:  COPD exacerbation [name])    New Prescriptions New Prescriptions   No medications on file     Deno Etienne, DO 12/16/16 1506

## 2016-12-15 NOTE — ED Triage Notes (Signed)
Pt states that she has been experiencing SOB and a cough  for 1.5 weeks. Her home neb treatments and inhalers did not help. She has a hx of COPD. Pt also states that she began having diarrhea a week ago. Reports a hx of diverticulitis. A&Ox4.

## 2016-12-15 NOTE — ED Notes (Signed)
NO ANSWER AFTER TWO  CALLS

## 2016-12-15 NOTE — ED Notes (Signed)
Pt placed on 2L d/t low O2. She denies O2 use at home.

## 2016-12-15 NOTE — ED Notes (Signed)
No answer when called 

## 2016-12-16 ENCOUNTER — Encounter (HOSPITAL_COMMUNITY): Payer: Self-pay | Admitting: Family Medicine

## 2016-12-16 ENCOUNTER — Inpatient Hospital Stay (HOSPITAL_COMMUNITY): Payer: Medicare Other

## 2016-12-16 DIAGNOSIS — J9621 Acute and chronic respiratory failure with hypoxia: Secondary | ICD-10-CM

## 2016-12-16 DIAGNOSIS — J441 Chronic obstructive pulmonary disease with (acute) exacerbation: Principal | ICD-10-CM

## 2016-12-16 LAB — HEPATIC FUNCTION PANEL
ALT: 23 U/L (ref 14–54)
AST: 16 U/L (ref 15–41)
Albumin: 3 g/dL — ABNORMAL LOW (ref 3.5–5.0)
Alkaline Phosphatase: 63 U/L (ref 38–126)
BILIRUBIN DIRECT: 0.1 mg/dL (ref 0.1–0.5)
BILIRUBIN INDIRECT: 0.3 mg/dL (ref 0.3–0.9)
Total Bilirubin: 0.4 mg/dL (ref 0.3–1.2)
Total Protein: 6.8 g/dL (ref 6.5–8.1)

## 2016-12-16 LAB — INFLUENZA PANEL BY PCR (TYPE A & B)
Influenza A By PCR: NEGATIVE
Influenza B By PCR: NEGATIVE

## 2016-12-16 LAB — GLUCOSE, CAPILLARY
GLUCOSE-CAPILLARY: 142 mg/dL — AB (ref 65–99)
GLUCOSE-CAPILLARY: 159 mg/dL — AB (ref 65–99)
Glucose-Capillary: 184 mg/dL — ABNORMAL HIGH (ref 65–99)
Glucose-Capillary: 216 mg/dL — ABNORMAL HIGH (ref 65–99)

## 2016-12-16 LAB — LIPASE, BLOOD: Lipase: 21 U/L (ref 11–51)

## 2016-12-16 MED ORDER — INSULIN ASPART 100 UNIT/ML ~~LOC~~ SOLN: 0.0000 [IU] | Freq: Every day | SUBCUTANEOUS | Status: DC

## 2016-12-16 MED ORDER — SODIUM CHLORIDE 0.9 % IV SOLN
INTRAVENOUS | Status: AC
  Administered 2016-12-16: 11:00:00 via INTRAVENOUS

## 2016-12-16 MED ORDER — LEVOFLOXACIN IN D5W 500 MG/100ML IV SOLN
500.0000 mg | Freq: Four times a day (QID) | INTRAVENOUS | Status: DC
  Administered 2016-12-16: 500 mg via INTRAVENOUS
  Filled 2016-12-16: qty 100

## 2016-12-16 MED ORDER — IOPAMIDOL (ISOVUE-300) INJECTION 61%
INTRAVENOUS | Status: AC
  Administered 2016-12-16: 30 mL
  Filled 2016-12-16: qty 30

## 2016-12-16 MED ORDER — ONDANSETRON HCL 4 MG/2ML IJ SOLN
4.0000 mg | Freq: Once | INTRAMUSCULAR | Status: AC
  Administered 2016-12-16: 4 mg via INTRAVENOUS
  Filled 2016-12-16: qty 2

## 2016-12-16 MED ORDER — ALBUTEROL SULFATE (2.5 MG/3ML) 0.083% IN NEBU
2.5000 mg | INHALATION_SOLUTION | Freq: Three times a day (TID) | RESPIRATORY_TRACT | Status: DC
  Administered 2016-12-16: 2.5 mg via RESPIRATORY_TRACT
  Filled 2016-12-16 (×3): qty 3

## 2016-12-16 MED ORDER — ENOXAPARIN SODIUM 40 MG/0.4ML ~~LOC~~ SOLN
40.0000 mg | SUBCUTANEOUS | Status: DC
  Administered 2016-12-16 – 2016-12-19 (×4): 40 mg via SUBCUTANEOUS
  Filled 2016-12-16 (×4): qty 0.4

## 2016-12-16 MED ORDER — IOPAMIDOL (ISOVUE-300) INJECTION 61%
INTRAVENOUS | Status: AC
  Administered 2016-12-16: 75 mL via INTRAVENOUS
  Filled 2016-12-16: qty 75

## 2016-12-16 MED ORDER — HYDROCODONE-ACETAMINOPHEN 5-325 MG PO TABS
2.0000 | ORAL_TABLET | Freq: Four times a day (QID) | ORAL | Status: DC | PRN
  Administered 2016-12-16 – 2016-12-19 (×9): 2 via ORAL
  Filled 2016-12-16 (×9): qty 2

## 2016-12-16 MED ORDER — INSULIN ASPART 100 UNIT/ML ~~LOC~~ SOLN: 0.0000 [IU] | Freq: Three times a day (TID) | SUBCUTANEOUS | Status: DC

## 2016-12-16 MED ORDER — PANTOPRAZOLE SODIUM 40 MG PO TBEC
40.0000 mg | DELAYED_RELEASE_TABLET | Freq: Every day | ORAL | Status: DC
  Administered 2016-12-16 – 2016-12-19 (×4): 40 mg via ORAL
  Filled 2016-12-16 (×4): qty 1

## 2016-12-16 MED ORDER — LEVOFLOXACIN IN D5W 250 MG/50ML IV SOLN
250.0000 mg | Freq: Every day | INTRAVENOUS | Status: DC
  Administered 2016-12-17: 250 mg via INTRAVENOUS
  Filled 2016-12-16: qty 50

## 2016-12-16 MED ORDER — LEVOFLOXACIN IN D5W 500 MG/100ML IV SOLN: 500.0000 mg | Freq: Every day | INTRAVENOUS | Status: DC

## 2016-12-16 MED ORDER — ONDANSETRON HCL 4 MG/2ML IJ SOLN
4.0000 mg | Freq: Four times a day (QID) | INTRAMUSCULAR | Status: DC | PRN
Start: 2016-12-16 — End: 2016-12-19
  Administered 2016-12-16: 4 mg via INTRAVENOUS
  Filled 2016-12-16 (×2): qty 2

## 2016-12-16 MED ORDER — METHYLPREDNISOLONE SODIUM SUCC 125 MG IJ SOLR
60.0000 mg | Freq: Four times a day (QID) | INTRAMUSCULAR | Status: DC
  Administered 2016-12-16 – 2016-12-17 (×5): 60 mg via INTRAVENOUS
  Filled 2016-12-16 (×5): qty 2

## 2016-12-16 MED ORDER — IPRATROPIUM-ALBUTEROL 0.5-2.5 (3) MG/3ML IN SOLN: 3.0000 mL | RESPIRATORY_TRACT | Status: DC | PRN

## 2016-12-16 MED ORDER — TIOTROPIUM BROMIDE MONOHYDRATE 18 MCG IN CAPS
18.0000 ug | ORAL_CAPSULE | Freq: Every day | RESPIRATORY_TRACT | Status: DC
  Administered 2016-12-16 – 2016-12-17 (×2): 18 ug via RESPIRATORY_TRACT
  Filled 2016-12-16: qty 5

## 2016-12-16 NOTE — Progress Notes (Signed)
PHARMACY NOTE:  ANTIMICROBIAL RENAL DOSAGE ADJUSTMENT  Current antimicrobial regimen includes a mismatch between antimicrobial dosage and estimated renal function.  As per policy approved by the Pharmacy & Therapeutics and Medical Executive Committees, the antimicrobial dosage will be adjusted accordingly.  Current antimicrobial dosage:  levaquin 500 mg IV q24h  Indication: CAP  Renal Function:  Estimated Creatinine Clearance: 40 mL/min (A) (by C-G formula based on SCr of 1.26 mg/dL (H)). []      On intermittent HD, scheduled: []      On CRRT    Antimicrobial dosage has been changed to:  250 mg IV q24h   Thank you for allowing pharmacy to be a part of this patient's care.  Lynelle Doctor, [facility] 12/16/2016 11:33 AM

## 2016-12-16 NOTE — Progress Notes (Signed)
Patient is seen and examined. Please see today's H&P for the details. 71 y/o female with PMH of COPD, Emphysema, Chronic Tobacco use presented with cough, shortness of breath. Admitted with acute exacerbations of copd. Cont iv steroids, scheduled bronchodilators, oxygen, levofloxacin. Counseled to stop smoking. She also reports diarrhea, vomiting with mild LLQ abdominal pain for 5 days. abd exam with mild tenderness on LLQ, no rebound. Will obtain ct abd r/o diverticulitis (she has h/o diverticulitis). Also check lipase, LFTs. Cont monitor Victoria Bird N

## 2016-12-16 NOTE — H&P (Signed)
History and Physical    Victoria Bird DJM:[id number] DOB: 02-26-1946 DOA: 12/15/2016  PCP: Hoyt Koch, MD   Patient coming from: Home  Chief Complaint: Cough, SOB   HPI: Victoria Bird is a 71 y.o. female with medical history significant for COPD, type 2 diabetes mellitus, GERD, and OSA who presents to the emergency department with 1-2 weeks of progressive dyspnea and cough productive of thick white sputum. Patient reports that she was in her usual state of health until one to 2 weeks ago when she noted the insidious development of worsening dyspnea and cough. She denies any fevers or chills and denies any significant lower extremity swelling or tenderness. There has been no associated chest pain or palpitations. Since the onset of symptoms, she has progressively worsened, initially dyspneic with exertion but now with rest. She is been using her home inhalers and nebulized treatments with no significant relief. She has not been on antibiotics or steroids recently. She denies any long distance travel, sick contacts, prolonged immobilization, hemoptysis, or personal or family history of VTE.    ED Course: Upon arrival to the ED, patient is found to be afebrile, saturating 86% on room air, tachypneic in the mid 20s, and with stable blood pressure and heart rate. EKG features a sinus rhythm with a nonspecific ST and T-wave abnormality inferiorly. She has to x-ray is notable for mild interstitial opacities at the bilateral bases possibly reflecting bronchial inflammation, but no focal infiltrate is seen. Chemistry panels notable for a creatinine 1.26 which is only slightly up from her apparent baseline and CBC was notable for mild leukocytosis to 11,700. Troponin is undetectable. Influenza PCR was negative. Patient was treated with 125 mg IV Solu-Medrol, duo nebs, and 2 g of IV magnesium in the ED. She reports some slight subjective improvement, but continues to require supplemental oxygen  while at rest. She will be admitted to the medical surgical unit for ongoing evaluation and management of acute exacerbation and COPD with acute hypoxic respiratory failure.  Review of Systems:  All other systems reviewed and apart from HPI, are negative.  Past Medical History:  Diagnosis Date  . Anxiety   . Chronic pain   . Colon polyps    adenomatous  . COPD (chronic obstructive pulmonary disease) ([place])   . Diabetes mellitus   . Diverticulitis   . Diverticulosis   . Emphysema lung ([place])   . Gall stones   . GERD (gastroesophageal reflux disease)   . H/O: pneumonia   . High cholesterol   . Hypertension   . OSA (obstructive sleep apnea)   . Tracheobronchitis 01/01/2012    Past Surgical History:  Procedure Laterality Date  . ABDOMINAL HYSTERECTOMY    . APPENDECTOMY    . Bilateral shoulder surgery    . CARPAL TUNNEL RELEASE Left   . CHOLECYSTECTOMY    . COLONOSCOPY    . ESOPHAGOGASTRODUODENOSCOPY    . FRACTURE SURGERY    . Right knee surgery    . ROTATOR CUFF REPAIR       reports that she has been smoking Cigarettes.  She started smoking about 42 years ago. She has a 61.50 pack-year smoking history. She has never used smokeless tobacco. She reports that she does not drink alcohol or use drugs.  Allergies  Allergen Reactions  . Aspartame And Phenylalanine Nausea And Vomiting    Patient says she is allergic to all artificial sweeteners  . Doxycycline Other (See Comments)    Nausea, vomiting, HA,  double vision  . Tramadol Shortness Of Breath and Nausea Only  . Codeine Itching    Has not tried benadryl to alleviate side effects  . Neurontin [Gabapentin] Other (See Comments)    Dizzy, "drugged" feeling, sleepy  . Sulfa Antibiotics Other (See Comments)    Kidney problem   . Symbicort [Budesonide-Formoterol Fumarate]     Swelling of face and inside of mouth per pt  . Penicillins Rash    Has patient had a PCN reaction causing immediate rash, facial/tongue/throat swelling,  SOB or lightheadedness with hypotension: No Has patient had a PCN reaction causing severe rash involving mucus membranes or skin necrosis: No Has patient had a PCN reaction that required hospitalization No Has patient had a PCN reaction occurring within the last 10 years: No If all of the above answers are "NO", then may proceed with Cephalosporin use.  . Prednisone Rash    Patient stated she received Prednisone while she was in the hospital and experienced a rash and "extreme pain.'    Family History  Problem Relation Age of Onset  . Heart attack Mother   . Diabetes Maternal Grandfather   . Heart attack Maternal Grandfather   . Hypertension Maternal Grandfather   . Stroke Maternal Grandfather   . Heart attack Maternal Grandmother   . Hypertension Maternal Grandmother   . Alcohol abuse Father   . Thyroid disease Neg Hx   . Lung disease Neg Hx   . Colon cancer Neg Hx      Prior to Admission medications   Medication Sig Start Date End Date Taking? Authorizing Provider  albuterol (PROAIR HFA) 108 (90 Base) MCG/ACT inhaler Inhale 2 puffs into the lungs every 6 (six) hours as needed for wheezing or shortness of breath. 10/17/16  Yes Javier Glazier, MD  ipratropium (ATROVENT) 0.02 % nebulizer solution Take 0.5 mg by nebulization every 4 (four) hours as needed for shortness of breath. As needed for SOB 03/28/16  Yes Historical Provider, MD  losartan-hydrochlorothiazide (HYZAAR) 100-12.5 MG tablet Take 1 tablet by mouth daily. 02/25/16  Yes Hoyt Koch, MD  metFORMIN (GLUCOPHAGE) 500 MG tablet Take 1 tablet (500 mg total) by mouth 2 (two) times daily with a meal. 10/04/16  Yes Hoyt Koch, MD  pantoprazole (PROTONIX) 40 MG tablet Take 1 tablet (40 mg total) by mouth daily. 02/25/16  Yes Hoyt Koch, MD  SPIRIVA HANDIHALER 18 MCG inhalation capsule PLACE 1 CAPSULE INTO INHALER AND INHALE DAILY 09/04/16  Yes Javier Glazier, MD  budesonide-formoterol [facility])  160-4.5 MCG/ACT inhaler Inhale 2 puffs into the lungs 2 (two) times daily. Patient not taking: Reported on 12/15/2016 07/19/16   Javier Glazier, MD  HYDROcodone-acetaminophen [facility]) 7.5-325 MG tablet Take 1 tablet by mouth every 4 (four) hours as needed for moderate pain. Patient not taking: Reported on 12/15/2016 10/24/16   Hoyt Koch, MD  Spacer/Aero-Holding Chambers (AEROCHAMBER MV) inhaler Use as instructed 07/19/16   Javier Glazier, MD  tiZANidine (ZANAFLEX) 4 MG tablet Take 1 tablet (4 mg total) by mouth every 6 (six) hours as needed for muscle spasms. Patient not taking: Reported on 12/15/2016 06/25/25   Delora Fuel, MD    Physical Exam: Vitals:   12/15/16 2242 12/15/16 2243 12/15/16 2324 12/16/16 0219  BP:   (!) 135/47 (!) 141/59  Pulse:   83 75  Resp:   (!) 21 20  Temp:      TempSrc:      SpO2: 92% 92% 91% Marland Kitchen)  89%  Weight:      Height:          Constitutional: Mild tachypnea, dyspneic with 4 words, no pallor or diaphoresis Eyes: PERTLA, lids and conjunctivae normal ENMT: Mucous membranes are moist. Posterior pharynx clear of any exudate or lesions.   Neck: normal, supple, no masses, no thyromegaly Respiratory: Markedly diminished bilaterally with occasional wheeze. No accessory muscle use.  Cardiovascular: S1 & S2 heard, regular rate and rhythm. No extremity edema. No significant JVD. Abdomen: No distension, no tenderness, no masses palpated. Bowel sounds normal.  Musculoskeletal: no clubbing / cyanosis. No joint deformity upper and lower extremities. Normal muscle tone.  Skin: no significant rashes, lesions, ulcers. Warm, dry, well-perfused. Neurologic: CN 2-12 grossly intact. Sensation intact, DTR normal. Strength 5/5 in all 4 limbs.  Psychiatric: Normal judgment and insight. Alert and oriented x 3. Normal mood and affect.     Labs on Admission: I have personally reviewed following labs and imaging studies  CBC:  Recent Labs Lab 12/15/16 2021  WBC  11.7*  HGB 14.0  HCT 42.9  MCV 86.7  PLT 790   Basic Metabolic Panel:  Recent Labs Lab 12/15/16 2021  NA 141  K 3.7  CL 104  CO2 29  GLUCOSE 103*  BUN 22*  CREATININE 1.26*  CALCIUM 9.2   GFR: Estimated Creatinine Clearance: 41.1 mL/min (A) (by C-G formula based on SCr of 1.26 mg/dL (H)). Liver Function Tests: No results for input(s): AST, ALT, ALKPHOS, BILITOT, PROT, ALBUMIN in the last 168 hours. No results for input(s): LIPASE, AMYLASE in the last 168 hours. No results for input(s): AMMONIA in the last 168 hours. Coagulation Profile: No results for input(s): INR, PROTIME in the last 168 hours. Cardiac Enzymes: No results for input(s): CKTOTAL, CKMB, CKMBINDEX, TROPONINI in the last 168 hours. BNP (last 3 results) No results for input(s): PROBNP in the last 8760 hours. HbA1C: No results for input(s): HGBA1C in the last 72 hours. CBG: No results for input(s): GLUCAP in the last 168 hours. Lipid Profile: No results for input(s): CHOL, HDL, LDLCALC, TRIG, CHOLHDL, LDLDIRECT in the last 72 hours. Thyroid Function Tests: No results for input(s): TSH, T4TOTAL, FREET4, T3FREE, THYROIDAB in the last 72 hours. Anemia Panel: No results for input(s): VITAMINB12, FOLATE, FERRITIN, TIBC, IRON, RETICCTPCT in the last 72 hours. Urine analysis:    Component Value Date/Time   COLORURINE YELLOW 06/07/2016 2030   APPEARANCEUR CLEAR 06/07/2016 2030   LABSPEC 1.010 06/07/2016 2030   PHURINE 5.0 06/07/2016 2030   GLUCOSEU NEGATIVE 06/07/2016 2030   [place] NEGATIVE 06/07/2016 2030   [place] NEGATIVE 06/07/2016 2030   [place] 06/07/2016 2030   PROTEINUR NEGATIVE 06/07/2016 2030   UROBILINOGEN 1.0 06/16/2015 1545   NITRITE NEGATIVE 06/07/2016 2030   LEUKOCYTESUR NEGATIVE 06/07/2016 2030   Sepsis Labs: @LABRCNTIP (procalcitonin:4,lacticidven:4) )No results found for this or any previous visit (from the past 240 hour(s)).   Radiological Exams on Admission: Dg Chest  2 View  Result Date: 12/15/2016 CLINICAL DATA:  Shortness of breath and cough EXAM: CHEST  2 VIEW COMPARISON:  06/16/2016 FINDINGS: There is hyperinflation. Coarse interstitial opacities at the bilateral lung bases, slightly increased, could represent bronchial inflammation. No consolidation or effusion. Stable cardiomediastinal silhouette. No pneumothorax. Mild mid thoracic wedging deformities. Surgical clips in the right upper quadrant. IMPRESSION: Mild interstitial opacities at the lung bases could reflect bronchial inflammation. There is no focal pulmonary infiltrate. Electronically Signed   By: Donavan Foil M.D.   On: 12/15/2016 22:15  EKG: Independently reviewed. Sinus rhythm, non-specific ST-T abnormalities inferiorly  Assessment/Plan  1. COPD with acute exacerbation, acute hypoxic respiratory failure   - Pt presents with progressive dyspnea and productive cough over past 1-2 wks  - Found to be hypoxic and in respiratory distress with neg influenza PCR, no focal infiltrate on CXR  - Treated with 125 mg IV Solu-Medrol, 2 g IV mag, and DuoNeb in ED - Continue systemic steroid, nebs, supplemental O2 - Check sputum culture and gram stain and start Levaquin    2. CKD stage III  - SCr is 1.26 on admission, only slightly up from apparent baseline  - Appears roughly euvolemic on admission  - Renally-dose medications, avoid nephrotoxins   3. Type II DM  - A1c only 5.7% in January 2018  - Managed with metformin at home, held on admission   - She will be on systemic steroids as above - Check CBG with meals and qHS - Start a low-intensity correctional Novolog  4. GERD - No EGD report on file - Managed with daily PPI, will continue    5. OSA  - Continue CPAP qHS     DVT prophylaxis: sq Lovenox Code Status: Full  Family Communication: Discussed with patient Disposition Plan: Admit to med-surg  Consults called: None Admission status: Inpatient    [name], MD [facility] Pager [phone]  If 7PM-7AM, please contact night-coverage [email or web address] Password [facility]  12/16/2016, 3:51 AM

## 2016-12-17 DIAGNOSIS — J9620 Acute and chronic respiratory failure, unspecified whether with hypoxia or hypercapnia: Secondary | ICD-10-CM

## 2016-12-17 DIAGNOSIS — R0902 Hypoxemia: Secondary | ICD-10-CM

## 2016-12-17 DIAGNOSIS — I714 Abdominal aortic aneurysm, without rupture: Secondary | ICD-10-CM

## 2016-12-17 DIAGNOSIS — Z720 Tobacco use: Secondary | ICD-10-CM

## 2016-12-17 DIAGNOSIS — I10 Essential (primary) hypertension: Secondary | ICD-10-CM

## 2016-12-17 LAB — GLUCOSE, CAPILLARY
GLUCOSE-CAPILLARY: 173 mg/dL — AB (ref 65–99)
Glucose-Capillary: 188 mg/dL — ABNORMAL HIGH (ref 65–99)
Glucose-Capillary: 155 mg/dL — ABNORMAL HIGH (ref 65–99)
Glucose-Capillary: 275 mg/dL — ABNORMAL HIGH (ref 65–99)

## 2016-12-17 MED ORDER — IPRATROPIUM-ALBUTEROL 0.5-2.5 (3) MG/3ML IN SOLN
3.0000 mL | Freq: Three times a day (TID) | RESPIRATORY_TRACT | Status: DC
  Filled 2016-12-17 (×2): qty 3

## 2016-12-17 MED ORDER — IPRATROPIUM-ALBUTEROL 0.5-2.5 (3) MG/3ML IN SOLN
3.0000 mL | Freq: Three times a day (TID) | RESPIRATORY_TRACT | Status: DC
  Administered 2016-12-18 – 2016-12-19 (×4): 3 mL via RESPIRATORY_TRACT
  Filled 2016-12-17 (×4): qty 3

## 2016-12-17 MED ORDER — ZOLPIDEM TARTRATE 5 MG PO TABS: 5.0000 mg | ORAL_TABLET | Freq: Every evening | ORAL | Status: DC | PRN

## 2016-12-17 MED ORDER — LEVOFLOXACIN 250 MG PO TABS
250.0000 mg | ORAL_TABLET | ORAL | Status: DC
  Administered 2016-12-18 – 2016-12-19 (×2): 250 mg via ORAL
  Filled 2016-12-17 (×2): qty 1

## 2016-12-17 MED ORDER — METHYLPREDNISOLONE SODIUM SUCC 125 MG IJ SOLR
60.0000 mg | Freq: Three times a day (TID) | INTRAMUSCULAR | Status: DC
  Administered 2016-12-17 – 2016-12-18 (×4): 60 mg via INTRAVENOUS
  Filled 2016-12-17 (×4): qty 2

## 2016-12-17 NOTE — Progress Notes (Signed)
Pt refuses cpap at this time.  Rt will monitor. 

## 2016-12-17 NOTE — Progress Notes (Signed)
PROGRESS NOTE  Victoria Bird:[id number] DOB: 10-02-1946 DOA: 12/15/2016 PCP: Hoyt Koch, MD  Brief History:  71 year old female with a history of COPD, GERD, diabetes mellitus, hypertension, hyperlipidemia presents with 1-2 week history of progressive shortness of breath and coughing with white sputum.  Since the onset of symptoms, she has progressively worsened, initially dyspneic with exertion but now with rest. She is been using her home inhalers and nebulized treatments with no significant relief. She has not been on antibiotics or steroids recently. She denies any long distance travel, sick contacts, prolonged immobilization, hemoptysis, or personal or family history of VTE.   however, for 5 days prior to admission, the patient had subjective fevers and chills with intermittent nausea, vomiting, diarrhea. Her vomiting and diarrhea have resolved since hospitalization.  Upon arrival to the ED, patient is found to be afebrile, saturating 86% on room air, tachypneic in the mid 20s, and with stable blood pressure and heart rate. EKG features a sinus rhythm with a nonspecific ST and T-wave abnormality inferiorly. She has to x-ray is notable for mild interstitial opacities at the bilateral bases possibly reflecting bronchial inflammation, but no focal infiltrate is seen.   Assessment/Plan: Acute respiratory failure with hypoxia -Secondary to COPD exacerbation -Presently stable on 2 L -Wean oxygen for saturation greater than 92%  COPD exacerbation -Continue intravenous Solu-Medrol--change to q 8 hours -Add duo nebs--doubt true "allergy" to symbicort -flutter valve -respiratory viral panel -influenza pcr negative  Tobacco abuse -continues to smoke -smoking cessation discussed  CKD 3 -baseline creatinine 1.0 to 1.2  Diabetes mellitus type 2, controlled -10/24/2016 hemoglobin A1c 5.7 -Expect elevated CBG secondary to steroids -Continue NovoLog sliding  scale  Abdominal aortic aneurysm -12/16/2016 CT abdomen and pelvis--no acute findings; AAA minimally increased from 3.8-->3.9 cm from one year ago -pt made aware -will need outpt surveillance [place] yearly  Abdominal pain -likely from recent gastroenteritis with vomiting and diarrhea -improving -eating 100% of diet  GERD -Continue PPI    Disposition Plan:   Home in 1-2 days  Family Communication:   No Family at bedside--Total time spent 35 minutes.  Greater than 50% spent face to face counseling and coordinating care.   Consultants:  none  Code Status:  FULL  DVT Prophylaxis:  [place] Lovenox   Procedures: As Listed in Progress Note Above  Antibiotics: None    Subjective: She still has some intermittent sharp right lower quadrant abdominal pain, but states that this is a little bit better. She is able to tolerate her diet eating 100% of her breakfast. She denies any further nausea, vomiting, diarrhea, dysuria, hematuria. She denies any fevers, chills, hemoptysis. Her shortness of breath is low but better. She continues to have a nonproductive cough. She has some dyspnea on exertion.  Objective: Vitals:   12/16/16 1450 12/16/16 1542 12/16/16 2050 12/17/16 0453  BP: (!) 150/69  (!) 145/73 (!) 154/83  Pulse: 67  70 (!) 58  Resp: 18  18 18   Temp: 97.8 F (36.6 C)  97.8 F (36.6 C) 98 F (36.7 C)  TempSrc: Oral  Oral Oral  SpO2: 93% 92% 95% 99%  Weight:      Height:        Intake/Output Summary (Last 24 hours) at 12/17/16 0945 Last data filed at 12/16/16 1846  Gross per 24 hour  Intake             1320 ml  Output  0 ml  Net             1320 ml   Weight change:  Exam:   General:  Pt is alert, follows commands appropriately, not in acute distress  HEENT: No icterus, No thrush, No neck mass, [place]/AT  Cardiovascular: RRR, S1/S2, no rubs, no gallops  Respiratory: Bibasilar wheezing with bibasilar rales. Good air movement.  Abdomen: Soft/+BS, non  tender, non distended, no guarding  Extremities: No edema, No lymphangitis, No petechiae, No rashes, no synovitis   Data Reviewed: I have personally reviewed following labs and imaging studies Basic Metabolic Panel:  Recent Labs Lab 12/15/16 2021  NA 141  K 3.7  CL 104  CO2 29  GLUCOSE 103*  BUN 22*  CREATININE 1.26*  CALCIUM 9.2   Liver Function Tests:  Recent Labs Lab 12/16/16 0406  AST 16  ALT 23  ALKPHOS 63  BILITOT 0.4  PROT 6.8  ALBUMIN 3.0*    Recent Labs Lab 12/16/16 0406  LIPASE 21   No results for input(s): AMMONIA in the last 168 hours. Coagulation Profile: No results for input(s): INR, PROTIME in the last 168 hours. CBC:  Recent Labs Lab 12/15/16 2021  WBC 11.7*  HGB 14.0  HCT 42.9  MCV 86.7  PLT 302   Cardiac Enzymes: No results for input(s): CKTOTAL, CKMB, CKMBINDEX, TROPONINI in the last 168 hours. BNP: Invalid input(s): POCBNP CBG:  Recent Labs Lab 12/16/16 0749 12/16/16 1233 12/16/16 1744 12/16/16 2058 12/17/16 0739  GLUCAP 142* 184* 216* 159* 173*   HbA1C: No results for input(s): HGBA1C in the last 72 hours. Urine analysis:    Component Value Date/Time   COLORURINE YELLOW 06/07/2016 2030   APPEARANCEUR CLEAR 06/07/2016 2030   LABSPEC 1.010 06/07/2016 2030   [place] 5.0 06/07/2016 2030   GLUCOSEU NEGATIVE 06/07/2016 2030   [place] NEGATIVE 06/07/2016 2030   [place] NEGATIVE 06/07/2016 2030   [place] 06/07/2016 2030   PROTEINUR NEGATIVE 06/07/2016 2030   UROBILINOGEN 1.0 06/16/2015 1545   NITRITE NEGATIVE 06/07/2016 2030   LEUKOCYTESUR NEGATIVE 06/07/2016 2030   Sepsis Labs: @LABRCNTIP (procalcitonin:4,lacticidven:4) )No results found for this or any previous visit (from the past 240 hour(s)).   Scheduled Meds: . enoxaparin (LOVENOX) injection  40 mg Subcutaneous Q24H  . insulin aspart  0-5 Units Subcutaneous QHS  . insulin aspart  0-9 Units Subcutaneous TID WC  . levofloxacin (LEVAQUIN) IV  250  mg Intravenous Q0600  . methylPREDNISolone (SOLU-MEDROL) injection  60 mg Intravenous Q6H  . pantoprazole  40 mg Oral Daily  . tiotropium  18 mcg Inhalation Daily   Continuous Infusions:  Procedures/Studies: Dg Chest 2 View  Result Date: 12/15/2016 CLINICAL DATA:  Shortness of breath and cough EXAM: CHEST  2 VIEW COMPARISON:  06/16/2016 FINDINGS: There is hyperinflation. Coarse interstitial opacities at the bilateral lung bases, slightly increased, could represent bronchial inflammation. No consolidation or effusion. Stable cardiomediastinal silhouette. No pneumothorax. Mild mid thoracic wedging deformities. Surgical clips in the right upper quadrant. IMPRESSION: Mild interstitial opacities at the lung bases could reflect bronchial inflammation. There is no focal pulmonary infiltrate. Electronically Signed   By: Donavan Foil M.D.   On: 12/15/2016 22:15   Ct Abdomen Pelvis W Contrast  Result Date: 12/16/2016 CLINICAL DATA:  History of diverticulitis. Nausea beginning yesterday. EXAM: CT ABDOMEN AND PELVIS WITH CONTRAST TECHNIQUE: Multidetector CT imaging of the abdomen and pelvis was performed using the standard protocol following bolus administration of intravenous contrast. CONTRAST:  1 ISOVUE-300 IOPAMIDOL (ISOVUE-300)  INJECTION 61% COMPARISON:  06/07/2016 and 12/26/2015 FINDINGS: Lower chest: Lung bases demonstrate patchy bilateral opacification which may be due to atelectasis versus infection or inflammatory process. No effusion. 4 mm nodule over the lateral left lower lobe unchanged. Hepatobiliary: Previous cholecystectomy. Liver and biliary tree are normal. Pancreas: Within normal. Spleen: There are a couple subcentimeter hypodensities likely cysts or hemangiomas unchanged. Adrenals/Urinary Tract: Adrenal glands are normal. Kidneys are normal in size without hydronephrosis or nephrolithiasis. Ureters and bladder are normal. Stomach/Bowel: Mild gastric distension. Small bowel is within normal.  Appendix is not visualized. Moderate diverticulosis of the colon most prominent over the sigmoid colon. Vascular/Lymphatic: Calcified plaque and mural thrombus over the abdominal aorta with mild dilatation of the infrarenal abdominal aorta measuring 3.9 cm in AP diameter. No adenopathy. Reproductive: Previous hysterectomy. Other: No free fluid or focal inflammatory change. Musculoskeletal: Minimal degenerate change of the spine and hips. IMPRESSION: No acute findings in the abdomen/pelvis. Mild patchy opacification within the lung bases likely due to an infectious or inflammatory process and less likely atelectasis. 4 mm nodule left lower lobe stable for 1 year and therefore considered benign. Aneurysmal dilatation of the infrarenal abdominal aorta measuring 4.0 cm (previously 3.8 cm). Aortic atherosclerosis. Recommend followup by ultrasound in 1 year. This recommendation follows ACR consensus guidelines: White Paper of the ACR Incidental Findings Committee II on Vascular Findings. J Am Coll Radiol 2013; 10:789-794. Stable subcentimeter splenic hypodensities likely cysts or hemangiomas. Colonic diverticulosis. Electronically Signed   By: Marin Olp M.D.   On: 12/16/2016 16:01    Ajanee Buren, DO  [facility] Pager [phone]  If 7PM-7AM, please contact night-coverage [email or web address] Password [email or web address] 12/17/2016, 9:45 AM   LOS: 2 days

## 2016-12-18 ENCOUNTER — Inpatient Hospital Stay (HOSPITAL_COMMUNITY): Payer: Medicare Other

## 2016-12-18 DIAGNOSIS — F17200 Nicotine dependence, unspecified, uncomplicated: Secondary | ICD-10-CM

## 2016-12-18 DIAGNOSIS — J9601 Acute respiratory failure with hypoxia: Secondary | ICD-10-CM

## 2016-12-18 DIAGNOSIS — E1121 Type 2 diabetes mellitus with diabetic nephropathy: Secondary | ICD-10-CM

## 2016-12-18 DIAGNOSIS — E1129 Type 2 diabetes mellitus with other diabetic kidney complication: Secondary | ICD-10-CM

## 2016-12-18 DIAGNOSIS — N058 Unspecified nephritic syndrome with other morphologic changes: Secondary | ICD-10-CM

## 2016-12-18 LAB — RESPIRATORY PANEL BY PCR
Adenovirus: NOT DETECTED
BORDETELLA PERTUSSIS-RVPCR: NOT DETECTED
CHLAMYDOPHILA PNEUMONIAE-RVPPCR: NOT DETECTED
CORONAVIRUS 229E-RVPPCR: NOT DETECTED
Coronavirus HKU1: NOT DETECTED
Coronavirus NL63: NOT DETECTED
Coronavirus OC43: NOT DETECTED
INFLUENZA A-RVPPCR: NOT DETECTED
INFLUENZA B-RVPPCR: NOT DETECTED
Metapneumovirus: NOT DETECTED
Mycoplasma pneumoniae: NOT DETECTED
PARAINFLUENZA VIRUS 3-RVPPCR: NOT DETECTED
PARAINFLUENZA VIRUS 4-RVPPCR: NOT DETECTED
Parainfluenza Virus 1: NOT DETECTED
Parainfluenza Virus 2: NOT DETECTED
RESPIRATORY SYNCYTIAL VIRUS-RVPPCR: NOT DETECTED
Rhinovirus / Enterovirus: NOT DETECTED

## 2016-12-18 LAB — GLUCOSE, CAPILLARY
GLUCOSE-CAPILLARY: 253 mg/dL — AB (ref 65–99)
GLUCOSE-CAPILLARY: 210 mg/dL — AB (ref 65–99)
GLUCOSE-CAPILLARY: 276 mg/dL — AB (ref 65–99)
Glucose-Capillary: 196 mg/dL — ABNORMAL HIGH (ref 65–99)
Glucose-Capillary: 166 mg/dL — ABNORMAL HIGH (ref 65–99)

## 2016-12-18 LAB — HEMOGLOBIN A1C
HEMOGLOBIN A1C: 5.7 % — AB (ref 4.8–5.6)
Mean Plasma Glucose: 117 mg/dL

## 2016-12-18 LAB — BASIC METABOLIC PANEL
ANION GAP: 7 (ref 5–15)
BUN: 39 mg/dL — ABNORMAL HIGH (ref 6–20)
CALCIUM: 8.5 mg/dL — AB (ref 8.9–10.3)
CO2: 22 mmol/L (ref 22–32)
Chloride: 109 mmol/L (ref 101–111)
Creatinine, Ser: 1.28 mg/dL — ABNORMAL HIGH (ref 0.44–1.00)
GFR, EST AFRICAN AMERICAN: 48 mL/min — AB (ref >60)
GFR, EST NON AFRICAN AMERICAN: 42 mL/min — AB (ref >60)
GLUCOSE: 197 mg/dL — AB (ref 65–99)
POTASSIUM: 4.6 mmol/L (ref 3.5–5.1)
SODIUM: 138 mmol/L (ref 135–145)

## 2016-12-18 LAB — MAGNESIUM: MAGNESIUM: 2.2 mg/dL (ref 1.7–2.4)

## 2016-12-18 MED ORDER — ALPRAZOLAM 0.25 MG PO TABS
0.2500 mg | ORAL_TABLET | Freq: Two times a day (BID) | ORAL | Status: DC | PRN
Start: 2016-12-18 — End: 2016-12-19
  Administered 2016-12-18: 0.25 mg via ORAL
  Filled 2016-12-18: qty 1

## 2016-12-18 MED ORDER — LEVOFLOXACIN 250 MG PO TABS: 250.0000 mg | ORAL_TABLET | Freq: Once | ORAL | 0 refills | Status: AC

## 2016-12-18 MED ORDER — AMLODIPINE BESYLATE 5 MG PO TABS
5.0000 mg | ORAL_TABLET | Freq: Every day | ORAL | Status: DC
  Administered 2016-12-18 – 2016-12-19 (×2): 5 mg via ORAL
  Filled 2016-12-18 (×2): qty 1

## 2016-12-18 MED ORDER — DEXAMETHASONE 4 MG PO TABS
4.0000 mg | ORAL_TABLET | Freq: Three times a day (TID) | ORAL | Status: DC
  Administered 2016-12-19 (×2): 4 mg via ORAL
  Filled 2016-12-18 (×2): qty 1

## 2016-12-18 MED ORDER — DEXAMETHASONE 4 MG PO TABS: 4.0000 mg | ORAL_TABLET | Freq: Three times a day (TID) | ORAL | 0 refills | Status: DC

## 2016-12-18 NOTE — Progress Notes (Addendum)
PROGRESS NOTE  Victoria Bird XNA:[id number] DOB: [birth year] DOA: 12/15/2016 PCP: Hoyt Koch, MD  Brief History:  71 year old female with a history of COPD, GERD, diabetes mellitus, hypertension, hyperlipidemia presents with 1-2 week history of progressive shortness of breath and coughing with white sputum.  Since the onset of symptoms, she has progressively worsened, initially dyspneic with exertion but now with rest. She is been using her home inhalers and nebulized treatments with no significant relief. She has not been on antibiotics or steroids recently. She denies any long distance travel, sick contacts, prolonged immobilization, hemoptysis, or personal or family history of VTE. however, for 5 days prior to admission, the patient had subjective fevers and chills with intermittent nausea, vomiting, diarrhea. Her vomiting and diarrhea have resolved since hospitalization. Upon arrival to the ED, patient is found to be afebrile, saturating 86% on room air, tachypneic in the mid 20s, and with stable blood pressure and heart rate. EKG features a sinus rhythm with a nonspecific ST and T-wave abnormality inferiorly. She has to x-ray is notable for mild interstitial opacities at the bilateral bases possibly reflecting bronchial inflammation, but no focal infiltrate is seen.   Assessment/Plan: Acute respiratory failure with hypoxia -Secondary to COPD exacerbation -Presently stable on 2 L-->weaned to room air -Wean oxygen for saturation greater than 92%  COPD exacerbation -Continue intravenous Solu-Medrol--change to po steroid in am 3/20 -Add duo nebs--doubt true "allergy" to symbicort -flutter valve -respiratory viral panel--negative -influenza pcr negative  Tobacco abuse -continues to smoke -smoking cessation discussed\  Anxiety disorder, NOS -alprazolam .025mg  q 12 prn -plan to start citalopram  CKD 3 -baseline creatinine 1.0 to 1.2  -hesitate to restart  ARB/HCTZ  Hypertension -start amlodipine  Diabetes mellitus type 2, controlled -10/24/2016 hemoglobin A1c 5.7 -Expect elevated CBG secondary to steroids -Continue NovoLog sliding scale -patient refusing insulin--discussed risks which pt understands  Abdominal aortic aneurysm -12/16/2016 CT abdomen and pelvis--no acute findings; AAA minimally increased from 3.8-->3.9 cm from one year ago -pt made aware -will need outpt surveillance [place] yearly  Abdominal pain -likely from recent gastroenteritis with vomiting and diarrhea -improving -eating 100% of diet  GERD -Continue PPI  Back pain -able to ambulate without difficulty -xray lumbar spine -UA/culture    Disposition Plan:   Home 3/20 if stable Family Communication:   No Family at bedside--Total time spent 35 minutes.  Greater than 50% spent face to face counseling and coordinating care.   Consultants:  none  Code Status:  FULL  DVT Prophylaxis:  [place] Lovenox   Procedures: As Listed in Progress Note Above  Antibiotics: None   Subjective: Patient having crying spells during the hospitalization. Feels very anxious. Having a lot of stress at home. Complains of low back pain. Able to ambulate to the bathroom. Denies any fevers, chills, chest pain, nausea, vomiting, diarrhea, dysuria, hematuria. No rashes.  Objective: Vitals:   12/18/16 0503 12/18/16 0716 12/18/16 1450 12/18/16 1457  BP: (!) 179/59   (!) 165/78  Pulse: 61 (!) 50 68 (!) 55  Resp: 16 16 18 18   Temp: 97.5 F (36.4 C)   97.5 F (36.4 C)  TempSrc: Oral   Oral  SpO2: 95% 95% 95% 97%  Weight: 71.7 kg (158 lb)     Height:        Intake/Output Summary (Last 24 hours) at 12/18/16 1744 Last data filed at 12/18/16 0910  Gross per 24 hour  Intake  1200 ml  Output                0 ml  Net             1200 ml   Weight change:  Exam:   General:  Pt is alert, follows commands appropriately, not in acute distress  HEENT: No  icterus, No thrush, No neck mass, [place]/AT  Cardiovascular: RRR, S1/S2, no rubs, no gallops  Respiratory: Bibasilar rales without wheezing. Good air movement.  Abdomen: Soft/+BS, non tender, non distended, no guarding  Extremities: No edema, No lymphangitis, No petechiae, No rashes, no synovitis   Data Reviewed: I have personally reviewed following labs and imaging studies Basic Metabolic Panel:  Recent Labs Lab 12/15/16 2021 12/18/16 0409  NA 141 138  K 3.7 4.6  CL 104 109  CO2 29 22  GLUCOSE 103* 197*  BUN 22* 39*  CREATININE 1.26* 1.28*  CALCIUM 9.2 8.5*  MG  --  2.2   Liver Function Tests:  Recent Labs Lab 12/16/16 0406  AST 16  ALT 23  ALKPHOS 63  BILITOT 0.4  PROT 6.8  ALBUMIN 3.0*    Recent Labs Lab 12/16/16 0406  LIPASE 21   No results for input(s): AMMONIA in the last 168 hours. Coagulation Profile: No results for input(s): INR, PROTIME in the last 168 hours. CBC:  Recent Labs Lab 12/15/16 2021  WBC 11.7*  HGB 14.0  HCT 42.9  MCV 86.7  PLT 302   Cardiac Enzymes: No results for input(s): CKTOTAL, CKMB, CKMBINDEX, TROPONINI in the last 168 hours. BNP: Invalid input(s): POCBNP CBG:  Recent Labs Lab 12/17/16 1724 12/17/16 2154 12/18/16 0302 12/18/16 0800 12/18/16 1249  GLUCAP 155* 275* 196* 166* 253*   HbA1C:  Recent Labs  12/16/16 0411  HGBA1C 5.7*   Urine analysis:    Component Value Date/Time   COLORURINE YELLOW 06/07/2016 2030   [place] 06/07/2016 2030   LABSPEC 1.010 06/07/2016 2030   PHURINE 5.0 06/07/2016 2030   GLUCOSEU NEGATIVE 06/07/2016 2030   [place] NEGATIVE 06/07/2016 2030   [place] NEGATIVE 06/07/2016 2030   [place] 06/07/2016 2030   PROTEINUR NEGATIVE 06/07/2016 2030   UROBILINOGEN 1.0 06/16/2015 1545   NITRITE NEGATIVE 06/07/2016 2030   LEUKOCYTESUR NEGATIVE 06/07/2016 2030   Sepsis Labs: @LABRCNTIP (procalcitonin:4,lacticidven:4) ) Recent Results (from the past 240 hour(s))   Respiratory Panel by PCR     Status: None   Collection Time: 12/17/16  9:48 AM  Result Value Ref Range Status   Adenovirus NOT DETECTED NOT DETECTED Final   Coronavirus 229E NOT DETECTED NOT DETECTED Final   Coronavirus HKU1 NOT DETECTED NOT DETECTED Final   Coronavirus NL63 NOT DETECTED NOT DETECTED Final   Coronavirus OC43 NOT DETECTED NOT DETECTED Final   Metapneumovirus NOT DETECTED NOT DETECTED Final   Rhinovirus / Enterovirus NOT DETECTED NOT DETECTED Final   Influenza A NOT DETECTED NOT DETECTED Final   Influenza B NOT DETECTED NOT DETECTED Final   Parainfluenza Virus 1 NOT DETECTED NOT DETECTED Final   Parainfluenza Virus 2 NOT DETECTED NOT DETECTED Final   Parainfluenza Virus 3 NOT DETECTED NOT DETECTED Final   Parainfluenza Virus 4 NOT DETECTED NOT DETECTED Final   Respiratory Syncytial Virus NOT DETECTED NOT DETECTED Final   Bordetella pertussis NOT DETECTED NOT DETECTED Final   Chlamydophila pneumoniae NOT DETECTED NOT DETECTED Final   Mycoplasma pneumoniae NOT DETECTED NOT DETECTED Final    Comment: Performed at [facility], [place].  [address], [place], [place] [address]     Scheduled Meds: . amLODipine  5 mg Oral Daily  . enoxaparin (LOVENOX) injection  40 mg Subcutaneous Q24H  . insulin aspart  0-5 Units Subcutaneous QHS  . insulin aspart  0-9 Units Subcutaneous TID WC  . ipratropium-albuterol  3 mL Nebulization TID  . levofloxacin  250 mg Oral Q24H  . methylPREDNISolone (SOLU-MEDROL) injection  60 mg Intravenous Q8H  . pantoprazole  40 mg Oral Daily   Continuous Infusions:  Procedures/Studies: Dg Chest 2 View  Result Date: 12/15/2016 CLINICAL DATA:  Shortness of breath and cough EXAM: CHEST  2 VIEW COMPARISON:  06/16/2016 FINDINGS: There is hyperinflation. Coarse interstitial opacities at the bilateral lung bases, slightly increased, could represent bronchial inflammation. No consolidation or effusion. Stable cardiomediastinal silhouette. No pneumothorax.  Mild mid thoracic wedging deformities. Surgical clips in the right upper quadrant. IMPRESSION: Mild interstitial opacities at the lung bases could reflect bronchial inflammation. There is no focal pulmonary infiltrate. Electronically Signed   By: Donavan Foil M.D.   On: 12/15/2016 22:15   Ct Abdomen Pelvis W Contrast  Result Date: 12/16/2016 CLINICAL DATA:  History of diverticulitis. Nausea beginning yesterday. EXAM: CT ABDOMEN AND PELVIS WITH CONTRAST TECHNIQUE: Multidetector CT imaging of the abdomen and pelvis was performed using the standard protocol following bolus administration of intravenous contrast. CONTRAST:  1 ISOVUE-300 IOPAMIDOL (ISOVUE-300) INJECTION 61% COMPARISON:  06/07/2016 and 12/26/2015 FINDINGS: Lower chest: Lung bases demonstrate patchy bilateral opacification which may be due to atelectasis versus infection or inflammatory process. No effusion. 4 mm nodule over the lateral left lower lobe unchanged. Hepatobiliary: Previous cholecystectomy. Liver and biliary tree are normal. Pancreas: Within normal. Spleen: There are a couple subcentimeter hypodensities likely cysts or hemangiomas unchanged. Adrenals/Urinary Tract: Adrenal glands are normal. Kidneys are normal in size without hydronephrosis or nephrolithiasis. Ureters and bladder are normal. Stomach/Bowel: Mild gastric distension. Small bowel is within normal. Appendix is not visualized. Moderate diverticulosis of the colon most prominent over the sigmoid colon. Vascular/Lymphatic: Calcified plaque and mural thrombus over the abdominal aorta with mild dilatation of the infrarenal abdominal aorta measuring 3.9 cm in AP diameter. No adenopathy. Reproductive: Previous hysterectomy. Other: No free fluid or focal inflammatory change. Musculoskeletal: Minimal degenerate change of the spine and hips. IMPRESSION: No acute findings in the abdomen/pelvis. Mild patchy opacification within the lung bases likely due to an infectious or inflammatory  process and less likely atelectasis. 4 mm nodule left lower lobe stable for 1 year and therefore considered benign. Aneurysmal dilatation of the infrarenal abdominal aorta measuring 4.0 cm (previously 3.8 cm). Aortic atherosclerosis. Recommend followup by ultrasound in 1 year. This recommendation follows ACR consensus guidelines: White Paper of the ACR Incidental Findings Committee II on Vascular Findings. J Am Coll Radiol 2013; 10:789-794. Stable subcentimeter splenic hypodensities likely cysts or hemangiomas. Colonic diverticulosis. Electronically Signed   By: Marin Olp M.D.   On: 12/16/2016 16:01    Lucresha Dismuke, DO  [facility] Pager [phone]  If 7PM-7AM, please contact night-coverage [email or web address] Password [email or web address] 12/18/2016, 5:44 PM   LOS: 3 days

## 2016-12-18 NOTE — Discharge Summary (Addendum)
Physician Discharge Summary  Victoria Bird BMW:[id number] DOB: 05-11-1946 DOA: 12/15/2016  PCP: Hoyt Koch, MD  Admit date: 12/15/2016 Discharge date: 12/19/16  Admitted From: Home Disposition:  Home  Recommendations for Outpatient Follow-up:  1. Follow up with PCP in 1-2 weeks 2. Please obtain BMP/CBC in one week   Home Health:No Equipment/Devices:none  Discharge Condition: Stable CODE STATUS:FULL Diet recommendation: Heart Healthy / Carb Modified    Brief/Interim Summary: 71 year old female with a history of COPD, GERD, diabetes mellitus, hypertension, hyperlipidemia presents with 1-2 week history of progressive shortness of breath and coughing with white sputum. Since the onset of symptoms, she has progressively worsened, initially dyspneic with exertion but now with rest. She is been using her home inhalers and nebulized treatments with no significant relief. She has not been on antibiotics or steroids recently. She denies any long distance travel, sick contacts, prolonged immobilization, hemoptysis, or personal or family history of VTE.however, for 5 days prior to admission, the patient had subjective fevers and chills with intermittent nausea, vomiting, diarrhea. Her vomiting and diarrhea have resolved since hospitalization. Upon arrival to the ED, patient is found to be afebrile, saturating 86% on room air, tachypneic in the mid 20s, and with stable blood pressure and heart rate. EKG features a sinus rhythm with a nonspecific ST and T-wave abnormality inferiorly. She has to x-ray is notable for mild interstitial opacities at the bilateral bases possibly reflecting bronchial inflammation, but no focal infiltrate is seen.   Discharge Diagnoses:   Acute respiratory failure with hypoxia -Secondary to COPD exacerbation -Presently stable on 2 L-->weaned to room air -Wean oxygen for saturation greater than 92% -desaturated with ambulation to 85%  COPD  exacerbation -Continue intravenous Solu-Medrol--change to po steroid in am 3/20 -dexamethasone 4 mg tid x 3 more days to complete 7 day burst tx -Addduo nebs--doubt true "allergy" to symbicort -flutter valve -respiratory viral panel--negative -influenza pcr negative -one more day levoflox to complete 5 days of tx  Tobacco abuse -continues to smoke -smoking cessation discussed  Anxiety disorder, NOS -alprazolam .025mg  q 12 prn -start citalopram 10 mg daily  CKD 3 -baseline creatinine 1.0 to 1.2  -hesitate to restart ARB/HCTZ  Hypertension -start amlodipine -holding Hyzaar due to high risk of AKI in setting of CKD--will not restart  Diabetes mellitus type 2, controlled -10/24/2016 hemoglobin A1c 5.7 -Expect elevated CBG secondary to steroids -Continue NovoLog sliding scale -patient refusing insulin--discussed risks which pt understands -resume metformin after d/c  Abdominal aortic aneurysm -12/16/2016 CT abdomen and pelvis--no acute findings; AAAminimally increased from 3.8-->3.9 cm from one year ago -pt made aware -will need outpt surveillance [place] yearly  Abdominal pain -likely from recent gastroenteritis with vomiting and diarrhea -improving -eating 100% of diet  GERD -Continue PPI  Back pain -able to ambulate without difficulty -xray lumbar spine--neg acute findings   Discharge Instructions  Discharge Instructions    Diet - low sodium heart healthy    Complete by:  As directed    Increase activity slowly    Complete by:  As directed      Allergies as of 12/19/2016      Reactions   Aspartame And Phenylalanine Nausea And Vomiting   Patient says she is allergic to all artificial sweeteners   Doxycycline Other (See Comments)   Nausea, vomiting, HA, double vision   Tramadol Shortness Of Breath, Nausea Only   Codeine Itching   Has not tried benadryl to alleviate side effects   Neurontin [gabapentin] Other (See Comments)  Dizzy, "drugged"  feeling, sleepy   Sulfa Antibiotics Other (See Comments)   Kidney problem    Symbicort [budesonide-formoterol Fumarate]    Swelling of face and inside of mouth per pt   Penicillins Rash   Has patient had a PCN reaction causing immediate rash, facial/tongue/throat swelling, SOB or lightheadedness with hypotension: No Has patient had a PCN reaction causing severe rash involving mucus membranes or skin necrosis: No Has patient had a PCN reaction that required hospitalization No Has patient had a PCN reaction occurring within the last 10 years: No If all of the above answers are "NO", then may proceed with Cephalosporin use.   Prednisone Rash   Patient stated she received Prednisone while she was in the hospital and experienced a rash and "extreme pain.'      Medication List    STOP taking these medications   budesonide-formoterol 160-4.5 MCG/ACT inhaler Commonly known as:  SYMBICORT   ipratropium 0.02 % nebulizer solution Commonly known as:  ATROVENT   losartan-hydrochlorothiazide 100-12.5 MG tablet Commonly known as:  HYZAAR   tiZANidine 4 MG tablet Commonly known as:  ZANAFLEX     TAKE these medications   AEROCHAMBER MV inhaler Use as instructed   albuterol 108 (90 Base) MCG/ACT inhaler Commonly known as:  PROAIR HFA Inhale 2 puffs into the lungs every 6 (six) hours as needed for wheezing or shortness of breath.   amLODipine 5 MG tablet Commonly known as:  NORVASC Take 1 tablet (5 mg total) by mouth daily. Start taking on:  12/20/2016   citalopram 10 MG tablet Commonly known as:  CELEXA Take 1 tablet (10 mg total) by mouth daily. Start taking on:  12/20/2016   dexamethasone 4 MG tablet Commonly known as:  DECADRON Take 1 tablet (4 mg total) by mouth every 8 (eight) hours.   HYDROcodone-acetaminophen 7.5-325 MG tablet Commonly known as:  NORCO Take 1 tablet by mouth every 4 (four) hours as needed for moderate pain.   levofloxacin 250 MG tablet Commonly known as:   LEVAQUIN Take 1 tablet (250 mg total) by mouth once. On 12/20/16 Start taking on:  12/20/2016   metFORMIN 500 MG tablet Commonly known as:  GLUCOPHAGE Take 1 tablet (500 mg total) by mouth 2 (two) times daily with a meal.   pantoprazole 40 MG tablet Commonly known as:  PROTONIX Take 1 tablet (40 mg total) by mouth daily.   SPIRIVA HANDIHALER 18 MCG inhalation capsule Generic drug:  tiotropium PLACE 1 CAPSULE INTO INHALER AND INHALE DAILY            Durable Medical Equipment        Start     Ordered   12/19/16 1503  For home use only DME oxygen  Once    Question Answer Comment  Mode or (Route) Nasal cannula   Liters per Minute 2   Frequency Continuous (stationary and portable oxygen unit needed)   Oxygen conserving device Yes   Oxygen delivery system Gas      12/19/16 1502      Allergies  Allergen Reactions  . Aspartame And Phenylalanine Nausea And Vomiting    Patient says she is allergic to all artificial sweeteners  . Doxycycline Other (See Comments)    Nausea, vomiting, HA, double vision  . Tramadol Shortness Of Breath and Nausea Only  . Codeine Itching    Has not tried benadryl to alleviate side effects  . Neurontin [Gabapentin] Other (See Comments)    Dizzy, "drugged" feeling, sleepy  .  Sulfa Antibiotics Other (See Comments)    Kidney problem   . Symbicort [Budesonide-Formoterol Fumarate]     Swelling of face and inside of mouth per pt  . Penicillins Rash    Has patient had a PCN reaction causing immediate rash, facial/tongue/throat swelling, SOB or lightheadedness with hypotension: No Has patient had a PCN reaction causing severe rash involving mucus membranes or skin necrosis: No Has patient had a PCN reaction that required hospitalization No Has patient had a PCN reaction occurring within the last 10 years: No If all of the above answers are "NO", then may proceed with Cephalosporin use.  . Prednisone Rash    Patient stated she received Prednisone  while she was in the hospital and experienced a rash and "extreme pain.'    Consultations:  none   Procedures/Studies: Dg Chest 2 View  Result Date: 12/15/2016 CLINICAL DATA:  Shortness of breath and cough EXAM: CHEST  2 VIEW COMPARISON:  06/16/2016 FINDINGS: There is hyperinflation. Coarse interstitial opacities at the bilateral lung bases, slightly increased, could represent bronchial inflammation. No consolidation or effusion. Stable cardiomediastinal silhouette. No pneumothorax. Mild mid thoracic wedging deformities. Surgical clips in the right upper quadrant. IMPRESSION: Mild interstitial opacities at the lung bases could reflect bronchial inflammation. There is no focal pulmonary infiltrate. Electronically Signed   By: Donavan Foil M.D.   On: 12/15/2016 22:15   Dg Lumbar Spine 2-3 Views  Result Date: 12/18/2016 CLINICAL DATA:  Acute onset of lower back pain.  Initial encounter. EXAM: LUMBAR SPINE - 2-3 VIEW COMPARISON:  CT the abdomen and pelvis from 12/16/2016 FINDINGS: There is no evidence of fracture or subluxation. Vertebral bodies demonstrate normal height and alignment. Intervertebral disc spaces are preserved. The visualized neural foramina are grossly unremarkable in appearance. The visualized bowel gas pattern is unremarkable in appearance; air and stool are noted within the colon. The sacroiliac joints are within normal limits. Clips are noted within the right upper quadrant, reflecting prior cholecystectomy. Scattered calcification is seen along the abdominal aorta. IMPRESSION: 1. No evidence of fracture or subluxation along the lumbar spine. 2. Scattered aortic atherosclerosis. Electronically Signed   By: Garald Balding M.D.   On: 12/18/2016 19:48   Ct Abdomen Pelvis W Contrast  Result Date: 12/16/2016 CLINICAL DATA:  History of diverticulitis. Nausea beginning yesterday. EXAM: CT ABDOMEN AND PELVIS WITH CONTRAST TECHNIQUE: Multidetector CT imaging of the abdomen and pelvis was  performed using the standard protocol following bolus administration of intravenous contrast. CONTRAST:  1 ISOVUE-300 IOPAMIDOL (ISOVUE-300) INJECTION 61% COMPARISON:  06/07/2016 and 12/26/2015 FINDINGS: Lower chest: Lung bases demonstrate patchy bilateral opacification which may be due to atelectasis versus infection or inflammatory process. No effusion. 4 mm nodule over the lateral left lower lobe unchanged. Hepatobiliary: Previous cholecystectomy. Liver and biliary tree are normal. Pancreas: Within normal. Spleen: There are a couple subcentimeter hypodensities likely cysts or hemangiomas unchanged. Adrenals/Urinary Tract: Adrenal glands are normal. Kidneys are normal in size without hydronephrosis or nephrolithiasis. Ureters and bladder are normal. Stomach/Bowel: Mild gastric distension. Small bowel is within normal. Appendix is not visualized. Moderate diverticulosis of the colon most prominent over the sigmoid colon. Vascular/Lymphatic: Calcified plaque and mural thrombus over the abdominal aorta with mild dilatation of the infrarenal abdominal aorta measuring 3.9 cm in AP diameter. No adenopathy. Reproductive: Previous hysterectomy. Other: No free fluid or focal inflammatory change. Musculoskeletal: Minimal degenerate change of the spine and hips. IMPRESSION: No acute findings in the abdomen/pelvis. Mild patchy opacification within the lung bases likely  due to an infectious or inflammatory process and less likely atelectasis. 4 mm nodule left lower lobe stable for 1 year and therefore considered benign. Aneurysmal dilatation of the infrarenal abdominal aorta measuring 4.0 cm (previously 3.8 cm). Aortic atherosclerosis. Recommend followup by ultrasound in 1 year. This recommendation follows ACR consensus guidelines: White Paper of the ACR Incidental Findings Committee II on Vascular Findings. J Am Coll Radiol 2013; 10:789-794. Stable subcentimeter splenic hypodensities likely cysts or hemangiomas. Colonic  diverticulosis. Electronically Signed   By: Marin Olp M.D.   On: 12/16/2016 16:01        Discharge Exam: Vitals:   12/19/16 1253 12/19/16 1339  BP:  (!) 146/68  Pulse: (!) 55 77  Resp: 18 16  Temp:  97.9 F (36.6 C)   Vitals:   12/19/16 0935 12/19/16 1030 12/19/16 1253 12/19/16 1339  BP: (!) 127/49   (!) 146/68  Pulse:   (!) 55 77  Resp:   18 16  Temp:    97.9 F (36.6 C)  TempSrc:    Oral  SpO2:  (!) 85% 90% 93%  Weight:      Height:        General: Pt is alert, awake, not in acute distress Cardiovascular: RRR, S1/S2 +, no rubs, no gallops Respiratory: CTA bilaterally, no wheezing, no rhonchi Abdominal: Soft, NT, ND, bowel sounds + Extremities: no edema, no cyanosis   The results of significant diagnostics from this hospitalization (including imaging, microbiology, ancillary and laboratory) are listed below for reference.    Significant Diagnostic Studies: Dg Chest 2 View  Result Date: 12/15/2016 CLINICAL DATA:  Shortness of breath and cough EXAM: CHEST  2 VIEW COMPARISON:  06/16/2016 FINDINGS: There is hyperinflation. Coarse interstitial opacities at the bilateral lung bases, slightly increased, could represent bronchial inflammation. No consolidation or effusion. Stable cardiomediastinal silhouette. No pneumothorax. Mild mid thoracic wedging deformities. Surgical clips in the right upper quadrant. IMPRESSION: Mild interstitial opacities at the lung bases could reflect bronchial inflammation. There is no focal pulmonary infiltrate. Electronically Signed   By: Donavan Foil M.D.   On: 12/15/2016 22:15   Dg Lumbar Spine 2-3 Views  Result Date: 12/18/2016 CLINICAL DATA:  Acute onset of lower back pain.  Initial encounter. EXAM: LUMBAR SPINE - 2-3 VIEW COMPARISON:  CT the abdomen and pelvis from 12/16/2016 FINDINGS: There is no evidence of fracture or subluxation. Vertebral bodies demonstrate normal height and alignment. Intervertebral disc spaces are preserved. The  visualized neural foramina are grossly unremarkable in appearance. The visualized bowel gas pattern is unremarkable in appearance; air and stool are noted within the colon. The sacroiliac joints are within normal limits. Clips are noted within the right upper quadrant, reflecting prior cholecystectomy. Scattered calcification is seen along the abdominal aorta. IMPRESSION: 1. No evidence of fracture or subluxation along the lumbar spine. 2. Scattered aortic atherosclerosis. Electronically Signed   By: Garald Balding M.D.   On: 12/18/2016 19:48   Ct Abdomen Pelvis W Contrast  Result Date: 12/16/2016 CLINICAL DATA:  History of diverticulitis. Nausea beginning yesterday. EXAM: CT ABDOMEN AND PELVIS WITH CONTRAST TECHNIQUE: Multidetector CT imaging of the abdomen and pelvis was performed using the standard protocol following bolus administration of intravenous contrast. CONTRAST:  1 ISOVUE-300 IOPAMIDOL (ISOVUE-300) INJECTION 61% COMPARISON:  06/07/2016 and 12/26/2015 FINDINGS: Lower chest: Lung bases demonstrate patchy bilateral opacification which may be due to atelectasis versus infection or inflammatory process. No effusion. 4 mm nodule over the lateral left lower lobe unchanged. Hepatobiliary: Previous cholecystectomy.  Liver and biliary tree are normal. Pancreas: Within normal. Spleen: There are a couple subcentimeter hypodensities likely cysts or hemangiomas unchanged. Adrenals/Urinary Tract: Adrenal glands are normal. Kidneys are normal in size without hydronephrosis or nephrolithiasis. Ureters and bladder are normal. Stomach/Bowel: Mild gastric distension. Small bowel is within normal. Appendix is not visualized. Moderate diverticulosis of the colon most prominent over the sigmoid colon. Vascular/Lymphatic: Calcified plaque and mural thrombus over the abdominal aorta with mild dilatation of the infrarenal abdominal aorta measuring 3.9 cm in AP diameter. No adenopathy. Reproductive: Previous hysterectomy.  Other: No free fluid or focal inflammatory change. Musculoskeletal: Minimal degenerate change of the spine and hips. IMPRESSION: No acute findings in the abdomen/pelvis. Mild patchy opacification within the lung bases likely due to an infectious or inflammatory process and less likely atelectasis. 4 mm nodule left lower lobe stable for 1 year and therefore considered benign. Aneurysmal dilatation of the infrarenal abdominal aorta measuring 4.0 cm (previously 3.8 cm). Aortic atherosclerosis. Recommend followup by ultrasound in 1 year. This recommendation follows ACR consensus guidelines: White Paper of the ACR Incidental Findings Committee II on Vascular Findings. J Am Coll Radiol 2013; 10:789-794. Stable subcentimeter splenic hypodensities likely cysts or hemangiomas. Colonic diverticulosis. Electronically Signed   By: Marin Olp M.D.   On: 12/16/2016 16:01     Microbiology: Recent Results (from the past 240 hour(s))  Respiratory Panel by PCR     Status: None   Collection Time: 12/17/16  9:48 AM  Result Value Ref Range Status   Adenovirus NOT DETECTED NOT DETECTED Final   Coronavirus 229E NOT DETECTED NOT DETECTED Final   Coronavirus HKU1 NOT DETECTED NOT DETECTED Final   Coronavirus NL63 NOT DETECTED NOT DETECTED Final   Coronavirus OC43 NOT DETECTED NOT DETECTED Final   Metapneumovirus NOT DETECTED NOT DETECTED Final   Rhinovirus / Enterovirus NOT DETECTED NOT DETECTED Final   Influenza A NOT DETECTED NOT DETECTED Final   Influenza B NOT DETECTED NOT DETECTED Final   Parainfluenza Virus 1 NOT DETECTED NOT DETECTED Final   Parainfluenza Virus 2 NOT DETECTED NOT DETECTED Final   Parainfluenza Virus 3 NOT DETECTED NOT DETECTED Final   Parainfluenza Virus 4 NOT DETECTED NOT DETECTED Final   Respiratory Syncytial Virus NOT DETECTED NOT DETECTED Final   Bordetella pertussis NOT DETECTED NOT DETECTED Final   Chlamydophila pneumoniae NOT DETECTED NOT DETECTED Final   Mycoplasma pneumoniae NOT  DETECTED NOT DETECTED Final    Comment: Performed at [facility], [address], [place], [place] [address]     Labs: Basic Metabolic Panel:  Recent Labs Lab 12/15/16 2021 12/18/16 0409 12/19/16 0457  NA 141 138 139  K 3.7 4.6 4.2  CL 104 109 108  CO2 29 22 25   GLUCOSE 103* 197* 210*  BUN 22* 39* 42*  CREATININE 1.26* 1.28* 1.48*  CALCIUM 9.2 8.5* 8.4*  MG  --  2.2  --    Liver Function Tests:  Recent Labs Lab 12/16/16 0406  AST 16  ALT 23  ALKPHOS 63  BILITOT 0.4  PROT 6.8  ALBUMIN 3.0*    Recent Labs Lab 12/16/16 0406  LIPASE 21   No results for input(s): AMMONIA in the last 168 hours. CBC:  Recent Labs Lab 12/15/16 2021  WBC 11.7*  HGB 14.0  HCT 42.9  MCV 86.7  PLT 302   Cardiac Enzymes: No results for input(s): CKTOTAL, CKMB, CKMBINDEX, TROPONINI in the last 168 hours. BNP: Invalid input(s): POCBNP CBG:  Recent Labs  Lab 12/18/16 1249 12/18/16 1758 12/18/16 2217 12/19/16 0237 12/19/16 0751  GLUCAP 253* 210* 276* 216* 121*    Time coordinating discharge:  Greater than 30 minutes  Signed:  Merry Pond, DO [facility] Pager: [phone] 12/19/2016, 3:15 PM

## 2016-12-18 NOTE — Progress Notes (Signed)
Pt has refused CPAP, Pt states that she does not use one at home.  RT to monitor and assess as needed.

## 2016-12-18 NOTE — Progress Notes (Signed)
Inpatient Diabetes Program Recommendations  AACE/ADA: New Consensus Statement on Inpatient Glycemic Control (2015)  Target Ranges:  Prepandial:   less than 140 mg/dL      Peak postprandial:   less than 180 mg/dL (1-2 hours)      Critically ill patients:  140 - 180 mg/dL   Results for Victoria Bird, Victoria Bird (MRN [id number]) as of 12/18/2016 12:07  Ref. Range 12/17/2016 07:39 12/17/2016 12:19 12/17/2016 17:24 12/17/2016 21:54  Glucose-Capillary Latest Ref Range: 65 - 99 mg/dL 173 (H) 188 (H) 155 (H) 275 (H)    Admit with: SOB/ COPD  History: DM, CKD  Home DM Meds: Metformin 500 mg BID  Current Insulin Orders: Novolog Sensitive Correction Scale/ SSI (0-9 units) TID AC + HS      MD- Note patient is refusing all doses of Novolog SSI.  CBGs fair despite patient refusal of Novolog and IV steroids.     --Will follow patient during hospitalization--  Wyn Quaker RN, MSN, CDE Diabetes Coordinator Inpatient Glycemic Control Team Team Pager: [phone] (8a-5p)

## 2016-12-19 LAB — BASIC METABOLIC PANEL
ANION GAP: 6 (ref 5–15)
BUN: 42 mg/dL — AB (ref 6–20)
CALCIUM: 8.4 mg/dL — AB (ref 8.9–10.3)
CO2: 25 mmol/L (ref 22–32)
Chloride: 108 mmol/L (ref 101–111)
Creatinine, Ser: 1.48 mg/dL — ABNORMAL HIGH (ref 0.44–1.00)
GFR, EST AFRICAN AMERICAN: 41 mL/min — AB (ref >60)
GFR, EST NON AFRICAN AMERICAN: 35 mL/min — AB (ref >60)
GLUCOSE: 210 mg/dL — AB (ref 65–99)
Potassium: 4.2 mmol/L (ref 3.5–5.1)
Sodium: 139 mmol/L (ref 135–145)

## 2016-12-19 LAB — GLUCOSE, CAPILLARY
Glucose-Capillary: 216 mg/dL — ABNORMAL HIGH (ref 65–99)
Glucose-Capillary: 121 mg/dL — ABNORMAL HIGH (ref 65–99)

## 2016-12-19 MED ORDER — CITALOPRAM HYDROBROMIDE 20 MG PO TABS: 10.0000 mg | ORAL_TABLET | Freq: Every day | ORAL | Status: DC

## 2016-12-19 MED ORDER — SODIUM CHLORIDE 0.9 % IV BOLUS (SEPSIS)
500.0000 mL | Freq: Once | INTRAVENOUS | Status: AC
  Administered 2016-12-19: 500 mL via INTRAVENOUS

## 2016-12-19 MED ORDER — TIOTROPIUM BROMIDE MONOHYDRATE 18 MCG IN CAPS
18.0000 ug | ORAL_CAPSULE | Freq: Every day | RESPIRATORY_TRACT | Status: DC
  Administered 2016-12-19: 18 ug via RESPIRATORY_TRACT
  Filled 2016-12-19: qty 5

## 2016-12-19 MED ORDER — ALBUTEROL SULFATE (2.5 MG/3ML) 0.083% IN NEBU
2.5000 mg | INHALATION_SOLUTION | Freq: Three times a day (TID) | RESPIRATORY_TRACT | Status: DC
  Administered 2016-12-19: 2.5 mg via RESPIRATORY_TRACT
  Filled 2016-12-19: qty 3

## 2016-12-19 MED ORDER — AMLODIPINE BESYLATE 5 MG PO TABS: 5.0000 mg | ORAL_TABLET | Freq: Every day | ORAL | 1 refills | Status: DC

## 2016-12-19 MED ORDER — CITALOPRAM HYDROBROMIDE 10 MG PO TABS: 10.0000 mg | ORAL_TABLET | Freq: Every day | ORAL | 1 refills | Status: DC

## 2016-12-19 NOTE — Progress Notes (Signed)
Patient ambulated in the hallway. Oxygen level was 85% on RA.

## 2016-12-19 NOTE — Progress Notes (Signed)
Patient's PM blood glucose level 276; per ordered sliding scale 3 units were to be given. Patient refused insulin coverage and requested home dose of Metformin due to her "bad kidneys." MD Baltazar Najjar notified of refusal; no further orders/measures placed. RN educated patient on importance of taking measures to reduce glucose level; especially while taking steroids. Patient continued to refuse after education. Will continue to monitor for changes.

## 2016-12-19 NOTE — Progress Notes (Signed)
Patient refused oxygen delivery, states she does not want it and she does not have room for it, RN and CM made aware.

## 2016-12-19 NOTE — Progress Notes (Signed)
Discharge instructions given, patient verbalized understanding. Prescriptions handed to patient. Patient refused home oxygen, educated re; the importance of using oxygen when she is SOB but still refused despite of the teachings.Patient is stable.

## 2016-12-19 NOTE — Progress Notes (Signed)
SATURATION QUALIFICATIONS: (This note is used to comply with regulatory documentation for home oxygen)  Patient Saturations on Room Air at Rest = 92%  Patient Saturations on Room Air while Ambulating = 85%  Patient Saturations on 2 Liters of oxygen while Ambulating = 93%  Please briefly explain why patient needs home oxygen: 

## 2016-12-19 NOTE — Progress Notes (Signed)
MD order for home 02. RN alerted of need for desat screen. [facility] rep alerted of need for home 02.  Marney Doctor RN,BSN,NCM [phone]

## 2016-12-19 NOTE — Care Management Important Message (Signed)
Important Message  Patient Details  Name: Victoria Bird MRN: [id number] Date of Birth: 1946-07-23   Medicare Important Message Given:  Yes    Kerin Salen 12/19/2016, 9:56 AMImportant Message  Patient Details  Name: Victoria Bird MRN: [id number] Date of Birth: June 04, 1946   Medicare Important Message Given:  Yes    Kerin Salen 12/19/2016, 9:55 AM

## 2016-12-19 NOTE — Progress Notes (Signed)
Patient d/c home. Stable. 

## 2016-12-20 ENCOUNTER — Telehealth: Payer: Self-pay | Admitting: Internal Medicine

## 2016-12-20 NOTE — Telephone Encounter (Signed)
Pt called and said she just got out of the hospital and needs a refill on her hydrocodone

## 2016-12-21 NOTE — Telephone Encounter (Signed)
This was a one time prescription and she needed additional visit with [place] for any more refills. Would recommend to schedule hospital follow up visit to discuss.

## 2016-12-21 NOTE — Telephone Encounter (Signed)
Contacted patient and stated that she needed a hospital follow up visit to discuss getting medication, patient was very hateful when hearing this and said that she has no transportation and is suffering at home in pain, stated that the doctor at the hospital couldn't give her any because of the script with Dr.Crawford.  Patient stated that when she got the script she was told it was good for one refill and I just explained to patient what I was told. I told patient I would send message to Dr.Crawford but that as stated before she needs a hospital follow up visit. Patient hung up phone.

## 2016-12-21 NOTE — Telephone Encounter (Signed)
Patient contacted and aware that she needs visit, patient still very upset stating she might just find another doctor, thinks she's been lied to that she was told one thing. Stated to patient there was nothing else I could do but her come in, she said thanks for the call and hung up

## 2016-12-21 NOTE — Telephone Encounter (Signed)
LVM to call back.

## 2016-12-21 NOTE — Telephone Encounter (Signed)
We cannot give another hydrocodone rx without a visit.

## 2016-12-22 ENCOUNTER — Encounter: Payer: Self-pay | Admitting: Physical Medicine & Rehabilitation

## 2017-01-11 ENCOUNTER — Other Ambulatory Visit: Payer: Self-pay | Admitting: Internal Medicine

## 2017-01-11 DIAGNOSIS — Z1231 Encounter for screening mammogram for malignant neoplasm of breast: Secondary | ICD-10-CM

## 2017-01-18 ENCOUNTER — Encounter: Payer: Self-pay | Admitting: Physical Medicine & Rehabilitation

## 2017-01-18 ENCOUNTER — Encounter: Payer: Medicare Other | Attending: Physical Medicine & Rehabilitation

## 2017-01-18 ENCOUNTER — Ambulatory Visit (HOSPITAL_BASED_OUTPATIENT_CLINIC_OR_DEPARTMENT_OTHER): Payer: Medicare Other | Admitting: Physical Medicine & Rehabilitation

## 2017-01-18 VITALS — BP 139/81 | HR 95

## 2017-01-18 DIAGNOSIS — E119 Type 2 diabetes mellitus without complications: Secondary | ICD-10-CM | POA: Diagnosis not present

## 2017-01-18 DIAGNOSIS — Z9889 Other specified postprocedural states: Secondary | ICD-10-CM | POA: Insufficient documentation

## 2017-01-18 DIAGNOSIS — Z9049 Acquired absence of other specified parts of digestive tract: Secondary | ICD-10-CM | POA: Diagnosis not present

## 2017-01-18 DIAGNOSIS — G8929 Other chronic pain: Secondary | ICD-10-CM | POA: Insufficient documentation

## 2017-01-18 DIAGNOSIS — J449 Chronic obstructive pulmonary disease, unspecified: Secondary | ICD-10-CM | POA: Insufficient documentation

## 2017-01-18 DIAGNOSIS — Z885 Allergy status to narcotic agent status: Secondary | ICD-10-CM | POA: Insufficient documentation

## 2017-01-18 DIAGNOSIS — E7800 Pure hypercholesterolemia, unspecified: Secondary | ICD-10-CM | POA: Diagnosis not present

## 2017-01-18 DIAGNOSIS — K219 Gastro-esophageal reflux disease without esophagitis: Secondary | ICD-10-CM | POA: Insufficient documentation

## 2017-01-18 DIAGNOSIS — Z823 Family history of stroke: Secondary | ICD-10-CM | POA: Insufficient documentation

## 2017-01-18 DIAGNOSIS — M25561 Pain in right knee: Secondary | ICD-10-CM | POA: Insufficient documentation

## 2017-01-18 DIAGNOSIS — M797 Fibromyalgia: Secondary | ICD-10-CM | POA: Diagnosis not present

## 2017-01-18 DIAGNOSIS — Z86010 Personal history of colonic polyps: Secondary | ICD-10-CM | POA: Insufficient documentation

## 2017-01-18 DIAGNOSIS — G4733 Obstructive sleep apnea (adult) (pediatric): Secondary | ICD-10-CM | POA: Diagnosis not present

## 2017-01-18 DIAGNOSIS — I10 Essential (primary) hypertension: Secondary | ICD-10-CM | POA: Diagnosis not present

## 2017-01-18 DIAGNOSIS — F17210 Nicotine dependence, cigarettes, uncomplicated: Secondary | ICD-10-CM | POA: Diagnosis not present

## 2017-01-18 DIAGNOSIS — Z833 Family history of diabetes mellitus: Secondary | ICD-10-CM | POA: Diagnosis not present

## 2017-01-18 DIAGNOSIS — Z8249 Family history of ischemic heart disease and other diseases of the circulatory system: Secondary | ICD-10-CM | POA: Insufficient documentation

## 2017-01-18 DIAGNOSIS — Z90710 Acquired absence of both cervix and uterus: Secondary | ICD-10-CM | POA: Insufficient documentation

## 2017-01-18 MED ORDER — DICLOFENAC SODIUM 1 % TD GEL: 2.0000 g | Freq: Four times a day (QID) | TRANSDERMAL | 1 refills | Status: DC

## 2017-01-18 MED ORDER — DULOXETINE HCL 20 MG PO CPEP: 20.0000 mg | ORAL_CAPSULE | Freq: Every day | ORAL | 1 refills | Status: DC

## 2017-01-18 NOTE — Addendum Note (Signed)
Addended by: Charlett Blake on: 01/18/2017 03:04 PM   Modules accepted: Level of Service

## 2017-01-18 NOTE — Progress Notes (Signed)
Subjective:    Patient ID: Victoria Bird, female    DOB: [birth year], 71 y.o.   MRN: [id number]  HPI CC Pain all over body  The patient states that she has pain in the neck, the back, shoulders as well as the knees and hips. She also has some tingling in hands and feet.  She has a history of diabetes managed without insulin.  No relief with heat or cold Legs restless at night, "spasms"  Prednisone used for COPD, pt told that her visual c/o s are related, Continues to smoke.  Pt denies depression Scared of leg buckling on Right , Had 1 episode of this in the past.  Uses cane to ambulate  Pain Inventory Average Pain 6 Pain Right Now 6 My pain is constant, sharp, burning, dull, stabbing, tingling and aching  In the last 24 hours, has pain interfered with the following? General activity 6 Relation with others 6 Enjoyment of life 8 What TIME of day is your pain at its worst? evening, night Sleep (in general) Poor  Pain is worse with: walking, bending, standing and some activites Pain improves with: rest, heat/ice and medication Relief from Meds: 0  Mobility walk without assistance walk with assistance use a cane use a walker ability to climb steps?  yes do you drive?  no  Function not employed: date last employed . disabled: date disabled . retired I need assistance with the following:  meal prep, household duties and shopping  Neuro/Psych weakness tingling trouble walking spasms dizziness  Prior Studies new visit MRI CERVICAL SPINE WITHOUT AND WITH CONTRAST   TECHNIQUE: Multiplanar and multiecho pulse sequences of the cervical spine, to include the craniocervical junction and cervicothoracic junction, were obtained according to standard protocol without and with intravenous contrast.   CONTRAST:  28mL MULTIHANCE GADOBENATE DIMEGLUMINE 529 MG/ML IV SOLN   COMPARISON:  MR brain 06/16/2015.   FINDINGS: There is considerable motion degradation.  Patient had a difficult time holding still. In addition she was breathing heavily causing additional motion artifact. The fastest sequences possible were used. Overall study is marginally diagnostic.   Normal cervical spine alignment. Normal intervertebral disc spaces. Normal cerebellar tonsils. No intraspinal mass lesion. No abnormal postcontrast enhancement. No definite neck masses. Flow voids are maintained in the BILATERAL vertebral arteries.   There is no definite hydromyelia or syrinx observed on today's study. On a single sagittal T2 image (image 8 series 3) there is a Gibbs artifact superimposed over the cord which appears fairly similar to yesterday's MR and may explain the appearance on 06/16/15. I do not see prominence of the central canal or a syrinx cavity on the axial images although those series, too, are of fairly poor quality. In the lower cervical and upper thoracic region, where there is less artifact from breathing and motion, the cord is completely normal.   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   Overall the cord appears normal without visible hydromyelia/syrinx, intraspinal mass lesion or postcontrast enhancement.   No T2 hyperintensity of the cord to suggest a mass or inflammation.     Electronically Signed   By: Staci Righter M.D.   On: 06/17/2015 21:36 MRI CERVICAL SPINE WITHOUT AND WITH CONTRAST   TECHNIQUE: Multiplanar and multiecho pulse sequences of the cervical spine, to include the craniocervical junction and cervicothoracic junction, were obtained according to standard protocol without and with intravenous contrast.   CONTRAST:  73mL MULTIHANCE GADOBENATE DIMEGLUMINE 529 MG/ML IV SOLN   COMPARISON:  MR brain 06/16/2015.   FINDINGS: There is considerable motion degradation. Patient had a difficult time holding still. In addition she was breathing heavily  causing additional motion artifact. The fastest sequences possible were used. Overall study is marginally diagnostic.   Normal cervical spine alignment. Normal intervertebral disc spaces. Normal cerebellar tonsils. No intraspinal mass lesion. No abnormal postcontrast enhancement. No definite neck masses. Flow voids are maintained in the BILATERAL vertebral arteries.   There is no definite hydromyelia or syrinx observed on today's study. On a single sagittal T2 image (image 8 series 3) there is a Gibbs artifact superimposed over the cord which appears fairly similar to yesterday's MR and may explain the appearance on 06/16/15. I do not see prominence of the central canal or a syrinx cavity on the axial images although those series, too, are of fairly poor quality. In the lower cervical and upper thoracic region, where there is less artifact from breathing and motion, the cord is completely normal.   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   Overall the cord appears normal without visible hydromyelia/syrinx, intraspinal mass lesion or postcontrast enhancement.   No T2 hyperintensity of the cord to suggest a mass or inflammation.     Electronically Signed   By: Staci Righter M.D.   On: 06/17/2015 21:36 MRI THORACIC SPINE WITHOUT AND WITH CONTRAST   TECHNIQUE: Multiplanar and multiecho pulse sequences of the thoracic spine were obtained without and with intravenous contrast.   CONTRAST:  31mL MULTIHANCE GADOBENATE DIMEGLUMINE 529 MG/ML IV SOLN   COMPARISON:  Prior CT from 06/08/2016.   FINDINGS: Mild exaggeration of the normal thoracic kyphosis. Vertebral bodies are otherwise normally aligned. No listhesis.   Vertebral heights maintained. No evidence for acute, subacute, or chronic fracture. Signal intensity within the vertebral body bone marrow is normal. Few scattered benign hemangiomas  noted. No worrisome osseous lesions. No abnormal enhancement.   Signal intensity within the thoracic spinal cord is normal.   Paraspinous soft tissues within normal limits. No acute soft tissue abnormality. Prominent atheromatous plaque noted within the visualized aorta, better evaluated on prior CT. Partially visualized visceral structures unremarkable.   Mild degenerative disc bulging noted at T5-6, T6-7, and T7-8 without significant stenosis. Mild bilateral facet arthrosis at T12-L1. At No other significant degenerative changes within the thoracic spine. No focal disc herniations. No canal or foraminal stenosis.   IMPRESSION: 1. No acute abnormality within the thoracic spine. 2. Minimal degenerative disc bulging within the mid thoracic spine without significant stenosis. No other significant degenerative changes identified. No findings to explain patient's back pain.     Electronically Signed   By: Jeannine Boga M.D.   On: 06/09/2016 22:45 CLINICAL DATA:  LOW BACK AND RIGHT LEG PAIN. MRI OF THE LUMBAR SPINE: MULTIPLANAR T1 AND T2-WEIGHTED IMAGING WAS PERFORMED. ALIGNMENT OF THE SPINE IS NORMAL.  THE INTERVERTEBRAL DISKS SHOW NORMAL HEIGHT AND MORPHOLOGY AT L3- 4 AND ABOVE. AT L4-5, THERE IS A MINIMAL RIGHT FORAMINAL TO EXTRAFORAMINAL ANNULAR RENT WITHOUT ANY SIGNIFICANT DISK PROTRUSION.  THIS COULD POSSIBLY IRRITATE THE RIGHT L4 NERVE ROOT. AT L5-S1, THE DISK IS NORMAL.  THERE ARE CHANGES OF MILD FACET HYPERTROPHY. IMPRESSION IN THIS PATIENT WITH RIGHT LEG SYMPTOMS, THERE IS A MINIMAL RIGHT FORAMINAL TO EXTRAFORAMINAL ANNULAR RENT AT L4-5 WHICH COULD IRRITATE THE RIGHT L4 NERVE ROOT.  THERE IS NO  FRANK HERNIATION OR NERVE ROOT COMPRESSION. MINIMAL FACET OVERGROWTH AT L5-S1.  Physicians involved in your care new visit   Family History  Problem Relation Age of Onset  . Heart attack Mother   . Diabetes Maternal Grandfather   . Heart attack Maternal Grandfather     . Hypertension Maternal Grandfather   . Stroke Maternal Grandfather   . Heart attack Maternal Grandmother   . Hypertension Maternal Grandmother   . Alcohol abuse Father   . Thyroid disease Neg Hx   . Lung disease Neg Hx   . Colon cancer Neg Hx    Social History   Social History  . Marital status: Divorced    Spouse name: N/A  . Number of children: 3  . Years of education: GED   Occupational History  . Disability    Social History Main Topics  . Smoking status: Current Every Day Smoker    Packs/day: 1.50    Years: 41.00    Types: Cigarettes    Start date: 03/19/1974  . Smokeless tobacco: Never Used     Comment: daily amount of cigarettes varies depending on stress level   . Alcohol use No  . Drug use: No  . Sexual activity: No   Other Topics Concern  . None   Social History Narrative   Daughter Edmonia Lynch) [phone]   Originally from [place]. Previously lived in [place]. She has traveled from [facility] to [place] & has also traveled across country to [place]. Multiple visits to [place], [place], [place], [place], [place]. She has a dog at home. No bird, mold, or hot tub exposure. She reports some years ago she did live in a home that had black mold in her closet. 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       Pt lives with her youngest daughter, lives in a trailer, does have stairs into home. Does have some troubled with that. Highest level of education is GED.    Past Surgical History:  Procedure Laterality Date  . ABDOMINAL HYSTERECTOMY    . APPENDECTOMY    . Bilateral shoulder surgery    . CARPAL TUNNEL RELEASE Left   . CHOLECYSTECTOMY    . COLONOSCOPY    . ESOPHAGOGASTRODUODENOSCOPY    . FRACTURE SURGERY    . Right knee surgery    . ROTATOR CUFF REPAIR     Past Medical History:  Diagnosis Date  . Anxiety   . Chronic pain   . Colon polyps     adenomatous  . COPD (chronic obstructive pulmonary disease) ([place])   . Diabetes mellitus   . Diverticulitis   . Diverticulosis   . Emphysema lung ([place])   . Gall stones   . GERD (gastroesophageal reflux disease)   . H/O: pneumonia   . High cholesterol   . Hypertension   . OSA (obstructive sleep apnea)   . Tracheobronchitis 01/01/2012   There were no vitals taken for this visit.  Opioid Risk Score:   Fall Risk Score:  `1  Depression screen PHQ 2/9  Depression screen PHQ 2/9 01/18/2017  Decreased Interest 3  Down, Depressed, Hopeless 3  PHQ - 2 Score 6  Tired, decreased energy 3  Change in appetite 3  Feeling bad or failure about yourself  3  Trouble concentrating 3  Moving slowly or fidgety/restless 3  Suicidal thoughts 0  Difficult doing  work/chores Extremely dIfficult     Review of Systems  Constitutional: Positive for appetite change and diaphoresis.  HENT: Negative.   Eyes: Negative.   Respiratory: Positive for cough, shortness of breath and wheezing.   Cardiovascular: Negative.   Gastrointestinal: Positive for constipation, diarrhea and nausea.  Endocrine: Negative.        High blood sugar  Genitourinary: Negative.   Musculoskeletal: Positive for arthralgias, back pain, gait problem, myalgias and neck pain.       Spasms   Allergic/Immunologic: Negative.   Neurological: Positive for dizziness and weakness.       Tingling  Hematological: Negative.   Psychiatric/Behavioral: Negative.        Objective:   Physical Exam  Constitutional: She is oriented to person, place, and time. She appears well-developed and well-nourished.  HENT:  Head: Normocephalic and atraumatic.  Eyes: Conjunctivae and EOM are normal. Pupils are equal, round, and reactive to light.  Neck: Normal range of motion.  Cardiovascular: Normal rate, regular rhythm and normal heart sounds.   Pulmonary/Chest: Effort normal and breath sounds normal. No respiratory distress. She has no wheezes.    Abdominal: Soft. Bowel sounds are normal. She exhibits no distension. There is no tenderness.  Neurological: She is alert and oriented to person, place, and time. Gait abnormal. Coordination normal.  Reflex Scores:      Tricep reflexes are 1+ on the right side and 1+ on the left side.      Bicep reflexes are 1+ on the right side and 1+ on the left side.      Brachioradialis reflexes are 1+ on the right side and 1+ on the left side.      Patellar reflexes are 1+ on the right side and 1+ on the left side.      Achilles reflexes are 1+ on the right side and 1+ on the left side. Ambulance with a cane, but has no evidence of toe drag or knee instability  Motor strength is 5/5 bilateral deltoid, biceps, triceps, grip, hip flexion, knee extension, ankle dorsiflexion  Sensation intact in the upper extremities with expansion of the median nerve distribution. Sensation intact in the left lower extremity, proximal right lower extremity  Skin: Skin is warm and dry.  Psychiatric: She has a normal mood and affect.  Nursing note and vitals reviewed.   Decreased sensation median nerve distribution Decreased pinprick Right toes compared to ankle 18/18 fibromyalgia tender points. Positive    Assessment & Plan:  1. Chronic widespread body pain, spine imaging is unremarkable, exam consistent with fibromyalgia. She has a history of thyroid mass but her last TSH was normal, follows up with endocrinology Recommend duloxetine 20 mg per day, instructed patient that this would be slow onset. Most common side effects would be nausea. May need to increase dosing. If ineffective at the current dose. May be a candidate for Lyrica Do not think schedule 2 narcotic analgesics would be indicated in this situation. She is allergic to tramadol and codeine  2. History of knee buckling, suspect degenerative meniscal tear versus osteoarthritic changes, recommend x-rays  3. Median distribution reduced sensation, will  check EMG/NCV to evaluate for focal compressive neuropathy She also has right toe numbness which may be related to her diabetes. At this point, the foot is not painful, however

## 2017-01-18 NOTE — Patient Instructions (Signed)

## 2017-01-30 DIAGNOSIS — I10 Essential (primary) hypertension: Secondary | ICD-10-CM | POA: Diagnosis not present

## 2017-01-30 DIAGNOSIS — E119 Type 2 diabetes mellitus without complications: Secondary | ICD-10-CM | POA: Diagnosis not present

## 2017-01-30 DIAGNOSIS — H2511 Age-related nuclear cataract, right eye: Secondary | ICD-10-CM | POA: Diagnosis not present

## 2017-01-30 DIAGNOSIS — H02839 Dermatochalasis of unspecified eye, unspecified eyelid: Secondary | ICD-10-CM | POA: Diagnosis not present

## 2017-01-30 DIAGNOSIS — H2513 Age-related nuclear cataract, bilateral: Secondary | ICD-10-CM | POA: Diagnosis not present

## 2017-01-31 LAB — HM DIABETES EYE EXAM

## 2017-02-01 ENCOUNTER — Ambulatory Visit
Admission: RE | Admit: 2017-02-01 | Discharge: 2017-02-01 | Disposition: A | Discharge status: Home / Self Care | Payer: Medicare Other | Source: Ambulatory Visit | Attending: Internal Medicine | Admitting: Internal Medicine

## 2017-02-01 DIAGNOSIS — Z1231 Encounter for screening mammogram for malignant neoplasm of breast: Secondary | ICD-10-CM | POA: Diagnosis not present

## 2017-02-09 ENCOUNTER — Encounter: Payer: Self-pay | Admitting: Internal Medicine

## 2017-02-09 NOTE — Progress Notes (Unsigned)
Results entered and sent to scan  

## 2017-02-15 ENCOUNTER — Other Ambulatory Visit: Payer: Self-pay | Admitting: Internal Medicine

## 2017-02-15 DIAGNOSIS — E0800 Diabetes mellitus due to underlying condition with hyperosmolarity without nonketotic hyperglycemic-hyperosmolar coma (NKHHC): Secondary | ICD-10-CM

## 2017-02-19 ENCOUNTER — Ambulatory Visit: Payer: Medicare Other | Admitting: Physical Medicine & Rehabilitation

## 2017-02-19 ENCOUNTER — Encounter: Payer: Medicare Other | Attending: Physical Medicine & Rehabilitation

## 2017-02-19 DIAGNOSIS — J449 Chronic obstructive pulmonary disease, unspecified: Secondary | ICD-10-CM | POA: Insufficient documentation

## 2017-02-19 DIAGNOSIS — M797 Fibromyalgia: Secondary | ICD-10-CM | POA: Insufficient documentation

## 2017-02-19 DIAGNOSIS — Z90710 Acquired absence of both cervix and uterus: Secondary | ICD-10-CM | POA: Insufficient documentation

## 2017-02-19 DIAGNOSIS — G8929 Other chronic pain: Secondary | ICD-10-CM | POA: Insufficient documentation

## 2017-02-19 DIAGNOSIS — Z823 Family history of stroke: Secondary | ICD-10-CM | POA: Insufficient documentation

## 2017-02-19 DIAGNOSIS — E7800 Pure hypercholesterolemia, unspecified: Secondary | ICD-10-CM | POA: Insufficient documentation

## 2017-02-19 DIAGNOSIS — I10 Essential (primary) hypertension: Secondary | ICD-10-CM | POA: Insufficient documentation

## 2017-02-19 DIAGNOSIS — E119 Type 2 diabetes mellitus without complications: Secondary | ICD-10-CM | POA: Insufficient documentation

## 2017-02-19 DIAGNOSIS — Z885 Allergy status to narcotic agent status: Secondary | ICD-10-CM | POA: Insufficient documentation

## 2017-02-19 DIAGNOSIS — Z833 Family history of diabetes mellitus: Secondary | ICD-10-CM | POA: Insufficient documentation

## 2017-02-19 DIAGNOSIS — Z8249 Family history of ischemic heart disease and other diseases of the circulatory system: Secondary | ICD-10-CM | POA: Insufficient documentation

## 2017-02-19 DIAGNOSIS — M25561 Pain in right knee: Secondary | ICD-10-CM | POA: Insufficient documentation

## 2017-02-19 DIAGNOSIS — K219 Gastro-esophageal reflux disease without esophagitis: Secondary | ICD-10-CM | POA: Insufficient documentation

## 2017-02-19 DIAGNOSIS — H2511 Age-related nuclear cataract, right eye: Secondary | ICD-10-CM | POA: Diagnosis not present

## 2017-02-19 DIAGNOSIS — F17210 Nicotine dependence, cigarettes, uncomplicated: Secondary | ICD-10-CM | POA: Insufficient documentation

## 2017-02-19 DIAGNOSIS — H2513 Age-related nuclear cataract, bilateral: Secondary | ICD-10-CM | POA: Diagnosis not present

## 2017-02-19 DIAGNOSIS — Z86010 Personal history of colonic polyps: Secondary | ICD-10-CM | POA: Insufficient documentation

## 2017-02-19 DIAGNOSIS — Z9889 Other specified postprocedural states: Secondary | ICD-10-CM | POA: Insufficient documentation

## 2017-02-19 DIAGNOSIS — G4733 Obstructive sleep apnea (adult) (pediatric): Secondary | ICD-10-CM | POA: Insufficient documentation

## 2017-02-19 DIAGNOSIS — Z9049 Acquired absence of other specified parts of digestive tract: Secondary | ICD-10-CM | POA: Insufficient documentation

## 2017-02-20 DIAGNOSIS — H2512 Age-related nuclear cataract, left eye: Secondary | ICD-10-CM | POA: Diagnosis not present

## 2017-03-02 ENCOUNTER — Encounter (HOSPITAL_COMMUNITY): Payer: Self-pay | Admitting: Emergency Medicine

## 2017-03-02 ENCOUNTER — Observation Stay (HOSPITAL_COMMUNITY)
Admission: EM | Admit: 2017-03-02 | Discharge: 2017-03-03 | Disposition: A | Discharge status: Home / Self Care | Payer: Medicare Other | Attending: Internal Medicine | Admitting: Internal Medicine

## 2017-03-02 DIAGNOSIS — J309 Allergic rhinitis, unspecified: Secondary | ICD-10-CM | POA: Diagnosis not present

## 2017-03-02 DIAGNOSIS — E7800 Pure hypercholesterolemia, unspecified: Secondary | ICD-10-CM | POA: Diagnosis not present

## 2017-03-02 DIAGNOSIS — Z880 Allergy status to penicillin: Secondary | ICD-10-CM | POA: Insufficient documentation

## 2017-03-02 DIAGNOSIS — I714 Abdominal aortic aneurysm, without rupture: Secondary | ICD-10-CM | POA: Insufficient documentation

## 2017-03-02 DIAGNOSIS — Z86010 Personal history of colonic polyps: Secondary | ICD-10-CM | POA: Insufficient documentation

## 2017-03-02 DIAGNOSIS — J449 Chronic obstructive pulmonary disease, unspecified: Secondary | ICD-10-CM | POA: Diagnosis present

## 2017-03-02 DIAGNOSIS — M797 Fibromyalgia: Secondary | ICD-10-CM | POA: Insufficient documentation

## 2017-03-02 DIAGNOSIS — G4733 Obstructive sleep apnea (adult) (pediatric): Secondary | ICD-10-CM | POA: Diagnosis not present

## 2017-03-02 DIAGNOSIS — E785 Hyperlipidemia, unspecified: Secondary | ICD-10-CM | POA: Diagnosis not present

## 2017-03-02 DIAGNOSIS — G894 Chronic pain syndrome: Secondary | ICD-10-CM | POA: Diagnosis not present

## 2017-03-02 DIAGNOSIS — Z8249 Family history of ischemic heart disease and other diseases of the circulatory system: Secondary | ICD-10-CM | POA: Insufficient documentation

## 2017-03-02 DIAGNOSIS — E041 Nontoxic single thyroid nodule: Secondary | ICD-10-CM | POA: Insufficient documentation

## 2017-03-02 DIAGNOSIS — Z6826 Body mass index (BMI) 26.0-26.9, adult: Secondary | ICD-10-CM | POA: Diagnosis not present

## 2017-03-02 DIAGNOSIS — Z882 Allergy status to sulfonamides status: Secondary | ICD-10-CM | POA: Insufficient documentation

## 2017-03-02 DIAGNOSIS — K219 Gastro-esophageal reflux disease without esophagitis: Secondary | ICD-10-CM | POA: Insufficient documentation

## 2017-03-02 DIAGNOSIS — I129 Hypertensive chronic kidney disease with stage 1 through stage 4 chronic kidney disease, or unspecified chronic kidney disease: Secondary | ICD-10-CM | POA: Insufficient documentation

## 2017-03-02 DIAGNOSIS — Z7984 Long term (current) use of oral hypoglycemic drugs: Secondary | ICD-10-CM | POA: Insufficient documentation

## 2017-03-02 DIAGNOSIS — E1121 Type 2 diabetes mellitus with diabetic nephropathy: Secondary | ICD-10-CM

## 2017-03-02 DIAGNOSIS — N183 Chronic kidney disease, stage 3 (moderate): Secondary | ICD-10-CM | POA: Diagnosis not present

## 2017-03-02 DIAGNOSIS — Z9049 Acquired absence of other specified parts of digestive tract: Secondary | ICD-10-CM | POA: Diagnosis not present

## 2017-03-02 DIAGNOSIS — E663 Overweight: Secondary | ICD-10-CM | POA: Insufficient documentation

## 2017-03-02 DIAGNOSIS — Z833 Family history of diabetes mellitus: Secondary | ICD-10-CM | POA: Insufficient documentation

## 2017-03-02 DIAGNOSIS — I10 Essential (primary) hypertension: Secondary | ICD-10-CM | POA: Diagnosis present

## 2017-03-02 DIAGNOSIS — J441 Chronic obstructive pulmonary disease with (acute) exacerbation: Secondary | ICD-10-CM | POA: Diagnosis not present

## 2017-03-02 DIAGNOSIS — Z881 Allergy status to other antibiotic agents status: Secondary | ICD-10-CM | POA: Insufficient documentation

## 2017-03-02 DIAGNOSIS — R42 Dizziness and giddiness: Secondary | ICD-10-CM | POA: Diagnosis not present

## 2017-03-02 DIAGNOSIS — Z888 Allergy status to other drugs, medicaments and biological substances status: Secondary | ICD-10-CM | POA: Insufficient documentation

## 2017-03-02 DIAGNOSIS — Z823 Family history of stroke: Secondary | ICD-10-CM | POA: Insufficient documentation

## 2017-03-02 DIAGNOSIS — Z885 Allergy status to narcotic agent status: Secondary | ICD-10-CM | POA: Insufficient documentation

## 2017-03-02 DIAGNOSIS — F4323 Adjustment disorder with mixed anxiety and depressed mood: Secondary | ICD-10-CM | POA: Insufficient documentation

## 2017-03-02 DIAGNOSIS — F17210 Nicotine dependence, cigarettes, uncomplicated: Secondary | ICD-10-CM | POA: Diagnosis not present

## 2017-03-02 DIAGNOSIS — R1110 Vomiting, unspecified: Secondary | ICD-10-CM | POA: Diagnosis not present

## 2017-03-02 DIAGNOSIS — Z79899 Other long term (current) drug therapy: Secondary | ICD-10-CM | POA: Diagnosis not present

## 2017-03-02 DIAGNOSIS — E1122 Type 2 diabetes mellitus with diabetic chronic kidney disease: Secondary | ICD-10-CM | POA: Diagnosis not present

## 2017-03-02 DIAGNOSIS — R0902 Hypoxemia: Secondary | ICD-10-CM | POA: Diagnosis not present

## 2017-03-02 DIAGNOSIS — E119 Type 2 diabetes mellitus without complications: Secondary | ICD-10-CM

## 2017-03-02 LAB — URINALYSIS, ROUTINE W REFLEX MICROSCOPIC
Bilirubin Urine: NEGATIVE
Glucose, UA: NEGATIVE mg/dL
HGB URINE DIPSTICK: NEGATIVE
KETONES UR: NEGATIVE mg/dL
LEUKOCYTES UA: NEGATIVE
Nitrite: NEGATIVE
PROTEIN: NEGATIVE mg/dL
Specific Gravity, Urine: 1.008 (ref 1.005–1.030)
pH: 5 (ref 5.0–8.0)

## 2017-03-02 LAB — CBC
HCT: 46.1 % — ABNORMAL HIGH (ref 36.0–46.0)
Hemoglobin: 15.4 g/dL — ABNORMAL HIGH (ref 12.0–15.0)
MCH: 28.6 pg (ref 26.0–34.0)
MCHC: 33.4 g/dL (ref 30.0–36.0)
MCV: 85.7 fL (ref 78.0–100.0)
PLATELETS: 217 10*3/uL (ref 150–400)
RBC: 5.38 MIL/uL — ABNORMAL HIGH (ref 3.87–5.11)
RDW: 14.2 % (ref 11.5–15.5)
WBC: 8 10*3/uL (ref 4.0–10.5)

## 2017-03-02 LAB — BASIC METABOLIC PANEL
Anion gap: 6 (ref 5–15)
BUN: 19 mg/dL (ref 6–20)
CALCIUM: 9 mg/dL (ref 8.9–10.3)
CO2: 28 mmol/L (ref 22–32)
CREATININE: 1.12 mg/dL — AB (ref 0.44–1.00)
Chloride: 105 mmol/L (ref 101–111)
GFR, EST AFRICAN AMERICAN: 57 mL/min — AB (ref >60)
GFR, EST NON AFRICAN AMERICAN: 49 mL/min — AB (ref >60)
Glucose, Bld: 110 mg/dL — ABNORMAL HIGH (ref 65–99)
Potassium: 4.5 mmol/L (ref 3.5–5.1)
SODIUM: 139 mmol/L (ref 135–145)

## 2017-03-02 LAB — CBG MONITORING, ED: GLUCOSE-CAPILLARY: 118 mg/dL — AB (ref 65–99)

## 2017-03-02 NOTE — ED Notes (Signed)
Bed: WA17 Expected date:  Expected time:  Means of arrival:  Comments: EMS dizziness/vomited

## 2017-03-02 NOTE — ED Triage Notes (Signed)
Patient arrives by EMS with complaints of dizziness and vomited x 2. Patient awoke at 1300 this afternoon and felt dizzy and light headed-feels like she is "going to pass out". Patient states her BP at that time was 116/80. Vomited x2 after 1600. Patient states she still lightheaded and has a hx of vertigo. BM x 1 WNL's for patient. CBG 106-patient states she has not taken any of her evening medications. VS per EMS is BP 148/77, hx COPD-O2 sat 90-91 and EMS placed on 4l/[place] and then d/c'ed the O2.

## 2017-03-03 ENCOUNTER — Emergency Department (HOSPITAL_COMMUNITY): Payer: Medicare Other

## 2017-03-03 ENCOUNTER — Encounter (HOSPITAL_COMMUNITY): Payer: Self-pay | Admitting: Internal Medicine

## 2017-03-03 DIAGNOSIS — I10 Essential (primary) hypertension: Secondary | ICD-10-CM | POA: Diagnosis not present

## 2017-03-03 DIAGNOSIS — J410 Simple chronic bronchitis: Secondary | ICD-10-CM

## 2017-03-03 DIAGNOSIS — R42 Dizziness and giddiness: Secondary | ICD-10-CM

## 2017-03-03 DIAGNOSIS — R1110 Vomiting, unspecified: Secondary | ICD-10-CM | POA: Diagnosis not present

## 2017-03-03 DIAGNOSIS — E1121 Type 2 diabetes mellitus with diabetic nephropathy: Secondary | ICD-10-CM

## 2017-03-03 MED ORDER — ONDANSETRON 8 MG PO TBDP
8.0000 mg | ORAL_TABLET | Freq: Once | ORAL | Status: AC
  Administered 2017-03-03: 8 mg via ORAL
  Filled 2017-03-03: qty 1

## 2017-03-03 MED ORDER — PREDNISOLONE ACETATE 1 % OP SUSP
1.0000 [drp] | Freq: Two times a day (BID) | OPHTHALMIC | Status: DC
  Filled 2017-03-03: qty 1

## 2017-03-03 MED ORDER — LORAZEPAM 0.5 MG PO TABS
0.5000 mg | ORAL_TABLET | Freq: Once | ORAL | Status: AC
  Administered 2017-03-03: 0.5 mg via ORAL
  Filled 2017-03-03: qty 1

## 2017-03-03 MED ORDER — ALBUTEROL SULFATE HFA 108 (90 BASE) MCG/ACT IN AERS: 2.0000 | INHALATION_SPRAY | Freq: Four times a day (QID) | RESPIRATORY_TRACT | Status: DC | PRN

## 2017-03-03 MED ORDER — BESIFLOXACIN HCL 0.6 % OP SUSP: 1.0000 [drp] | Freq: Three times a day (TID) | OPHTHALMIC | Status: DC

## 2017-03-03 MED ORDER — ONDANSETRON HCL 4 MG PO TABS: 4.0000 mg | ORAL_TABLET | Freq: Four times a day (QID) | ORAL | Status: DC | PRN

## 2017-03-03 MED ORDER — LOSARTAN POTASSIUM-HCTZ 100-12.5 MG PO TABS: 1.0000 | ORAL_TABLET | Freq: Every day | ORAL | Status: DC | PRN

## 2017-03-03 MED ORDER — ACETAMINOPHEN 650 MG RE SUPP: 650.0000 mg | Freq: Four times a day (QID) | RECTAL | Status: DC | PRN

## 2017-03-03 MED ORDER — METFORMIN HCL 500 MG PO TABS
500.0000 mg | ORAL_TABLET | Freq: Two times a day (BID) | ORAL | Status: DC
  Filled 2017-03-03: qty 1

## 2017-03-03 MED ORDER — TIOTROPIUM BROMIDE MONOHYDRATE 18 MCG IN CAPS
18.0000 ug | ORAL_CAPSULE | Freq: Every day | RESPIRATORY_TRACT | Status: DC
  Administered 2017-03-03: 18 ug via RESPIRATORY_TRACT
  Filled 2017-03-03: qty 5

## 2017-03-03 MED ORDER — PANTOPRAZOLE SODIUM 40 MG PO TBEC
40.0000 mg | DELAYED_RELEASE_TABLET | Freq: Every day | ORAL | Status: DC
  Administered 2017-03-03: 40 mg via ORAL
  Filled 2017-03-03: qty 1

## 2017-03-03 MED ORDER — DIFLUPREDNATE 0.05 % OP EMUL: 1.0000 [drp] | Freq: Two times a day (BID) | OPHTHALMIC | Status: DC

## 2017-03-03 MED ORDER — ALBUTEROL SULFATE (2.5 MG/3ML) 0.083% IN NEBU: 2.5000 mg | INHALATION_SOLUTION | Freq: Four times a day (QID) | RESPIRATORY_TRACT | Status: DC | PRN

## 2017-03-03 MED ORDER — MECLIZINE HCL 12.5 MG PO TABS: 12.5000 mg | ORAL_TABLET | Freq: Four times a day (QID) | ORAL | 0 refills | Status: DC | PRN

## 2017-03-03 MED ORDER — BROMFENAC SODIUM 0.07 % OP SOLN: 1.0000 [drp] | Freq: Every day | OPHTHALMIC | Status: DC

## 2017-03-03 MED ORDER — SODIUM CHLORIDE 0.9 % IV SOLN
INTRAVENOUS | Status: DC
  Administered 2017-03-03: 13:00:00 via INTRAVENOUS

## 2017-03-03 MED ORDER — TIZANIDINE HCL 4 MG PO TABS: 4.0000 mg | ORAL_TABLET | Freq: Four times a day (QID) | ORAL | Status: DC | PRN

## 2017-03-03 MED ORDER — ACETAMINOPHEN 325 MG PO TABS: 650.0000 mg | ORAL_TABLET | Freq: Four times a day (QID) | ORAL | Status: DC | PRN

## 2017-03-03 MED ORDER — KETOROLAC TROMETHAMINE 0.5 % OP SOLN
1.0000 [drp] | Freq: Every day | OPHTHALMIC | Status: DC
  Filled 2017-03-03: qty 3

## 2017-03-03 MED ORDER — MECLIZINE HCL 25 MG PO TABS
12.5000 mg | ORAL_TABLET | Freq: Four times a day (QID) | ORAL | Status: DC | PRN
  Administered 2017-03-03: 12.5 mg via ORAL
  Filled 2017-03-03: qty 1

## 2017-03-03 MED ORDER — SODIUM CHLORIDE 0.9 % IV BOLUS (SEPSIS)
1000.0000 mL | Freq: Once | INTRAVENOUS | Status: AC
  Administered 2017-03-03: 1000 mL via INTRAVENOUS

## 2017-03-03 MED ORDER — ONDANSETRON HCL 4 MG/2ML IJ SOLN: 4.0000 mg | Freq: Four times a day (QID) | INTRAMUSCULAR | Status: DC | PRN

## 2017-03-03 MED ORDER — GATIFLOXACIN 0.5 % OP SOLN
1.0000 [drp] | Freq: Three times a day (TID) | OPHTHALMIC | Status: DC
  Filled 2017-03-03: qty 2.5

## 2017-03-03 MED ORDER — ZOLPIDEM TARTRATE 5 MG PO TABS: 5.0000 mg | ORAL_TABLET | Freq: Every evening | ORAL | Status: DC | PRN

## 2017-03-03 NOTE — Evaluation (Signed)
Physical Therapy Evaluation Patient Details Name: Victoria Bird MRN: [id number] DOB: 20-Jul-1946 Today's Date: 03/03/2017   History of Present Illness  71 y.o. female with history of retention, hyperlipidemia, diabetes mellitus type 2, COPD, objective sleep apnea, chronic pain, and anxiety who presents with dizziness  Clinical Impression  Pt admitted with above diagnosis. Pt currently with functional limitations due to the deficits listed below (see PT Problem List).  Pt will benefit from skilled PT to increase their independence and safety with mobility to allow discharge to the venue listed below.  Pt positive for upbeating nystagmus with testing of L Marye Round so performed canalith repositioning technique.  Pt would best benefit from outpatient vestibular PT f/u if symptoms persist however if pt unable then recommend HHPT with vestibular trained therapist.    03/03/17 0001  Vestibular Assessment  General Observation Pt reports sudden onset of dizziness at 3 am 03/03/17.  Pt also reports knots in L neck musculature at times recently as well as decreased hearing in L ear.  Pt reports decreased hearing has been occurring over time and not necessarily worse.  Pt denies tinnitus at present however reports ringing in ears occasionally occurs.  Symptom Behavior  Type of Dizziness Spinning  Frequency of Dizziness states all the time but symptom descriptors and frequency tend to change with continued questioning  Aggravating Factors Turning head quickly;Turning head sideways  Relieving Factors Head stationary  Occulomotor Exam  Occulomotor Alignment Normal (recent cataract surgery R eye 5/21)  Spontaneous Absent  Gaze-induced Absent  Head shaking Horizontal Absent  Smooth Pursuits Intact  Saccades Intact  Comment able to perform oculomotor exam however cautious and hesitant, did not perform fast head movements  Positional Testing  Dix-Hallpike Dix-Hallpike Right;Dix-Hallpike Left   Horizontal Canal Testing Horizontal Canal Right;Horizontal Canal Left  Dix-Hallpike Right  Dix-Hallpike Right Symptoms No nystagmus  Dix-Hallpike Left  Dix-Hallpike Left Duration approx 20 sec  Dix-Hallpike Left Symptoms Other (comment) (pt with difficulty keeping eyes open, observed nystagmus)  Horizontal Canal Right  Horizontal Canal Right Symptoms Normal  Horizontal Canal Left  Horizontal Canal Left Symptoms Normal       Follow Up Recommendations Home health PT;Outpatient PT (will need vestibular therapist)    Equipment Recommendations  None recommended by PT    Recommendations for Other Services       Precautions / Restrictions Precautions Precautions: Fall      Mobility  Bed Mobility Overal bed mobility: Needs Assistance Bed Mobility: Supine to Sit;Sit to Supine     Supine to sit: Min guard Sit to supine: Min guard   General bed mobility comments: very slow cautious movement  Transfers Overall transfer level: Needs assistance Equipment used: Rolling walker (2 wheeled) Transfers: Sit to/from Stand Sit to Stand: Min guard         General transfer comment: min/guard for safety  Ambulation/Gait Ambulation/Gait assistance: Min guard Ambulation Distance (Feet): 40 Feet Assistive device: Rolling walker (2 wheeled) Gait Pattern/deviations: Step-through pattern;Decreased stride length     General Gait Details: slow but steady with RW, verbal cues for focusing on stationary object  Stairs            Wheelchair Mobility    Modified Rankin (Stroke Patients Only)       Balance Overall balance assessment: History of Falls  Pertinent Vitals/Pain Pain Assessment: No/denies pain    Home Living Family/patient expects to be discharged to:: Private residence Living Arrangements: Children Available Help at Discharge: Available PRN/intermittently Type of Home: Mobile home Home Access:  Stairs to enter Entrance Stairs-Rails: Right Entrance Stairs-Number of Steps: 6 Home Layout: One level Home Equipment: Walker - 2 wheels;Cane - single point      Prior Function Level of Independence: Independent with assistive device(s)         Comments: uses cane in home. sometimes uses walker.      Hand Dominance        Extremity/Trunk Assessment        Lower Extremity Assessment Lower Extremity Assessment: Generalized weakness       Communication   Communication: No difficulties  Cognition Arousal/Alertness: Awake/alert Behavior During Therapy: WFL for tasks assessed/performed Overall Cognitive Status: Within Functional Limits for tasks assessed                                        General Comments      Exercises     Assessment/Plan    PT Assessment Patient needs continued PT services  PT Problem List Decreased balance;Decreased mobility (L BPPV, vertigo)       PT Treatment Interventions DME instruction;Gait training;Therapeutic exercise;Therapeutic activities;Functional mobility training;Patient/family education (CRT, compensation strategies, vestibular exercises)    PT Goals (Current goals can be found in the Care Plan section)  Acute Rehab PT Goals PT Goal Formulation: With patient Time For Goal Achievement: 03/07/17 Potential to Achieve Goals: Good    Frequency Min 3X/week   Barriers to discharge        Co-evaluation               AM-PAC PT "6 Clicks" Daily Activity  Outcome Measure Difficulty turning over in bed (including adjusting bedclothes, sheets and blankets)?: None Difficulty moving from lying on back to sitting on the side of the bed? : A Little Difficulty sitting down on and standing up from a chair with arms (e.g., wheelchair, bedside commode, etc,.)?: A Little Help needed moving to and from a bed to chair (including a wheelchair)?: A Little Help needed walking in hospital room?: A Little Help needed  climbing 3-5 steps with a railing? : A Little 6 Click Score: 19    End of Session   Activity Tolerance: Other (comment) (tolerated okay however with nausea) Patient left: in bed;with call bell/phone within reach   PT Visit Diagnosis: BPPV BPPV - Right/Left : Left    Time: 4481-8563 PT Time Calculation (min) (ACUTE ONLY): 39 min   Charges:   PT Evaluation $PT Eval Moderate Complexity: 1 Procedure PT Treatments $Canalith Rep Proc: 8-22 mins   PT G Codes:   PT G-Codes **NOT FOR INPATIENT CLASS** Functional Assessment Tool Used: AM-PAC 6 Clicks Basic Mobility;Clinical judgement Functional Limitation: Mobility: Walking and moving around Mobility: Walking and Moving Around Current Status ([id number]): At least 20 percent but less than 40 percent impaired, limited or restricted Mobility: Walking and Moving Around Goal Status [phone]): At least 1 percent but less than 20 percent impaired, limited or restricted    [name], PT, DPT 03/03/2017 Pager: [phone]   York Ram E 03/03/2017, 3:31 PM

## 2017-03-03 NOTE — Care Management Note (Addendum)
Case Management Note  Patient Details  Name: Victoria Bird MRN: [id number] Date of Birth: [birth year]  Subjective/Objective:   Dizziness                 Action/Plan: Discharge Planning: NCM spoke to pt and offered choice for [facility]. Pt refusing HH. States she lives in small mobile home and she does not have room. States she has RW and cane at home. Her son lives in the home but he does not assist her with ADL's. Provided pt with contact information for [facility], explained office will contact her to arrange appt. NCM contacted dtr, Edmonia Lynch. Dtr states she will not be able to commit to taking her to outpt PT. Faxed order to [facility].   PCP Hoyt Koch MD   Expected Discharge Date:  03/03/17               Expected Discharge Plan:  [place]  In-House Referral:  Clinical Social Work  Discharge planning Services  CM Consult  Post Acute Care Choice:  Home Health Choice offered to:  Patient  DME Arranged:  N/A DME Agency:  NA  HH Arranged:  Patient Refused [place] Agency:  NA  Status of Service:  Completed, signed off  If discussed at [place] of Stay Meetings, dates discussed:    Additional Comments:  [name], RN 03/03/2017, 4:33 PM

## 2017-03-03 NOTE — ED Provider Notes (Signed)
[facility] DEPT Provider Note   CSN: [id number] Arrival date & time: 03/02/17  2230  By signing my name below, I, Jaquelyn Bitter., attest that this documentation has been prepared under the direction and in the presence of Daleen Bo, MD. Electronically signed: Jaquelyn Bitter., ED Scribe. 03/03/17. 2:07 AM.   History   Chief Complaint Chief Complaint  Patient presents with  . Dizziness  . RUQ pain    Vomiting    HPI Victoria Bird is a 71 y.o. female with hx of aortic aneurysm who presents [facility] the [facility] complaining of dizziness with onset x9 hours. Pt states that she awoke x9 hours ago has had increasing dizziness. She reportedly does not sleep at night and awoke at [place] with a "massive headache." She states that currently she feels nauseated and as if the room is spinning. Pt reports RUQ abdominal pain, diaphoresis. . She denies any modifying factors. Pt denies appetite change. Of note, She reports difficulty ambulating for the past x1 day. Pt reports recent hx of cataract removal OD x13 days ago. Pt's PCP is Dr. Pricilla Holm. She was here x3 months ago and said that she should have her aortic aneurysm checked every x1 year.   The history is provided by the patient. No language interpreter was used.    Past Medical History:  Diagnosis Date  . Anxiety   . Chronic pain   . Colon polyps    adenomatous  . COPD (chronic obstructive pulmonary disease) ([place])   . Diabetes mellitus   . Diverticulitis   . Diverticulosis   . Emphysema lung ([place])   . Gall stones   . GERD (gastroesophageal reflux disease)   . H/O: pneumonia   . High cholesterol   . Hypertension   . OSA (obstructive sleep apnea)   . Tracheobronchitis 01/01/2012    Patient Active Problem List   Diagnosis Date Noted  . Fibromyalgia 01/18/2017  . Tobacco abuse 12/17/2016  . CKD (chronic kidney disease), stage III 12/15/2016  . COPD with acute exacerbation ([place])  12/15/2016  . AAA (abdominal aortic aneurysm) without rupture ([place]) 06/08/2016  . Right-sided low back pain without sciatica   . Adjustment disorder with mixed anxiety and depressed mood 02/25/2016  . Overweight (BMI 25.0-29.9) 01/25/2016  . Allergic rhinitis 11/17/2015  . Tobacco use disorder 06/22/2015  . Diabetes mellitus type 2, controlled ([place]) 06/16/2015  . H/O recurrent pneumonia 05/27/2015  . Acute on chronic respiratory failure with hypoxia ([place]) 05/27/2015  . GERD (gastroesophageal reflux disease) 05/27/2015  . Hyperlipidemia 05/27/2015  . Chronic pain syndrome 05/27/2015  . Thyroid nodule 01/20/2015  . COPD (chronic obstructive pulmonary disease) ([place]) 10/09/2014  . COPD exacerbation ([place]) 04/10/2013  . Acute respiratory failure with hypoxia ([place]) 04/10/2013  . Hypertension 12/14/2011  . OSA (obstructive sleep apnea) 10/02/2008    Past Surgical History:  Procedure Laterality Date  . ABDOMINAL HYSTERECTOMY    . APPENDECTOMY    . Bilateral shoulder surgery    . CARPAL TUNNEL RELEASE Left   . CHOLECYSTECTOMY    . COLONOSCOPY    . ESOPHAGOGASTRODUODENOSCOPY    . FRACTURE SURGERY    . Right knee surgery    . ROTATOR CUFF REPAIR      OB History    No data available       Home Medications    Prior to Admission medications   Medication Sig Start Date End Date Taking? Authorizing Provider  albuterol (PROAIR HFA) 108 (  90 Base) MCG/ACT inhaler Inhale 2 puffs into the lungs every 6 (six) hours as needed for wheezing or shortness of breath. 10/17/16  Yes Nestor, Sonia Baller, MD  BESIVANCE 0.6 % SUSP Place 1 drop into the right eye 3 (three) times daily. 03/01/17  Yes [provider]  DUREZOL 0.05 % EMUL Place 1 drop into the right eye 2 (two) times daily. 03/01/17  Yes [provider]  losartan-hydrochlorothiazide (HYZAAR) 100-12.5 MG tablet Take 1 tablet by mouth daily as needed (elevated BP).    Yes [provider]  metFORMIN (GLUCOPHAGE) 500 MG  tablet TAKE 1 TABLET(500 MG) BY MOUTH TWICE DAILY WITH A MEAL 02/15/17  Yes Hoyt Koch, MD  pantoprazole (PROTONIX) 40 MG tablet Take 1 tablet (40 mg total) by mouth daily. Patient taking differently: Take 40 mg by mouth daily as needed (indigestion).  02/25/16  Yes Hoyt Koch, MD  PROLENSA 0.07 % SOLN Place 1 drop into the right eye at bedtime. 02/20/17  Yes [provider]  Spacer/Aero-Holding Chambers (AEROCHAMBER MV) inhaler Use as instructed 07/19/16  Yes Javier Glazier, MD  SPIRIVA HANDIHALER 18 MCG inhalation capsule PLACE 1 CAPSULE INTO INHALER AND INHALE DAILY 09/04/16  Yes Javier Glazier, MD  tiZANidine (ZANAFLEX) 4 MG tablet Take 4 mg by mouth every 6 (six) hours as needed for muscle spasms.   Yes [provider]  diclofenac sodium (VOLTAREN) 1 % GEL Apply 2 g topically 4 (four) times daily. Patient not taking: Reported on 03/03/2017 01/18/17   Charlett Blake, MD  DULoxetine (CYMBALTA) 20 MG capsule Take 1 capsule (20 mg total) by mouth daily. Patient not taking: Reported on 03/03/2017 01/18/17   Charlett Blake, MD    Family History Family History  Problem Relation Age of Onset  . Heart attack Mother   . Diabetes Maternal Grandfather   . Heart attack Maternal Grandfather   . Hypertension Maternal Grandfather   . Stroke Maternal Grandfather   . Heart attack Maternal Grandmother   . Hypertension Maternal Grandmother   . Alcohol abuse Father   . Thyroid disease Neg Hx   . Lung disease Neg Hx   . Colon cancer Neg Hx   . Breast cancer Neg Hx     Social History Social History  Substance Use Topics  . Smoking status: Current Every Day Smoker    Packs/day: 1.50    Years: 41.00    Types: Cigarettes    Start date: 03/19/1974  . Smokeless tobacco: Never Used     Comment: daily amount of cigarettes varies depending on stress level   . Alcohol use No     Allergies   Aspartame and phenylalanine; Doxycycline; Tramadol; Codeine;  Neurontin [gabapentin]; Sulfa antibiotics; Symbicort [budesonide-formoterol fumarate]; Penicillins; and Prednisone   Review of Systems Review of Systems  Constitutional: Positive for diaphoresis. Negative for fever.  Gastrointestinal: Positive for abdominal pain, nausea and vomiting.  Neurological: Positive for dizziness and light-headedness.  All other systems reviewed and are negative.    Physical Exam Updated Vital Signs BP (!) 160/70   Pulse 72   Resp (!) 23   SpO2 93%   Physical Exam  Constitutional: She is oriented to person, place, and time. She appears well-developed and well-nourished.  HENT:  Head: Normocephalic and atraumatic.  Right Ear: External ear normal.  Left Ear: External ear normal.  Eyes: Conjunctivae and EOM are normal. Pupils are equal, round, and reactive to light.  Neck: Normal range of motion  and phonation normal. Neck supple.  Cardiovascular: Normal rate, regular rhythm and normal heart sounds.   Pulmonary/Chest: Effort normal and breath sounds normal. She exhibits no bony tenderness.  Abdominal: Soft. There is no tenderness.  Musculoskeletal: Normal range of motion.  Neurological: She is alert and oriented to person, place, and time. No cranial nerve deficit or sensory deficit. She exhibits normal muscle tone. Coordination normal.  No dysarthria, aphasia or nystagmus. Negative head impulse testing . No Pronator drift, no ataxia.   Skin: Skin is warm, dry and intact.  Psychiatric: She has a normal mood and affect. Her behavior is normal. Judgment and thought content normal.  Nursing note and vitals reviewed.    ED Treatments / Results   DIAGNOSTIC STUDIES: Oxygen Saturation is 92% on 4L/[place], inadequate by my interpretation.   COORDINATION OF CARE: 2:07 AM-Discussed next steps with pt. Pt verbalized understanding and is agreeable with the plan.    Labs (all labs ordered are listed, but only abnormal results are displayed) Labs Reviewed    BASIC METABOLIC PANEL - Abnormal; Notable for the following:       Result Value   Glucose, Bld 110 (*)    Creatinine, Ser 1.12 (*)    GFR calc non Af Amer 49 (*)    GFR calc Af Amer 57 (*)    All other components within normal limits  CBC - Abnormal; Notable for the following:    RBC 5.38 (*)    Hemoglobin 15.4 (*)    HCT 46.1 (*)    All other components within normal limits  URINALYSIS, ROUTINE W REFLEX MICROSCOPIC - Abnormal; Notable for the following:    APPearance HAZY (*)    All other components within normal limits  CBG MONITORING, ED - Abnormal; Notable for the following:    Glucose-Capillary 118 (*)    All other components within normal limits    EKG  EKG Interpretation  Date/Time:  Friday March 02 2017 22:56:59 EDT Ventricular Rate:  64 PR Interval:    QRS Duration: 79 QT Interval:  430 QTC Calculation: 444 R Axis:   48 Text Interpretation:  Sinus rhythm since last tracing no significant change Confirmed by Daleen Bo [phone] on 03/03/2017 12:28:34 AM       Radiology No results found.  Procedures Procedures (including critical care time)  Medications Ordered in ED Medications  LORazepam (ATIVAN) tablet 0.5 mg (0.5 mg Oral Given 03/03/17 0059)  ondansetron (ZOFRAN-ODT) disintegrating tablet 8 mg (8 mg Oral Given 03/03/17 0059)     Initial Impression / Assessment and Plan / ED Course  I have reviewed the triage vital signs and the nursing notes.  Pertinent labs & imaging results that were available during my care of the patient were reviewed by me and considered in my medical decision making (see chart for details).      Patient Vitals for the past 24 hrs:  BP Pulse Resp SpO2  03/03/17 0101 (!) 160/70 72 (!) 23 93 %  03/03/17 0000 (!) 150/76 (!) 58 20 92 %    2:07 AM Reevaluation with update and discussion. After initial assessment and treatment, an updated evaluation reveals she feels less dizzy now, but a little groggy.  Ambulation trial  requested. Beuford Garcilazo L   02: 5 0-with ambulation trial she was unsteady, and stated that she felt no better.  At this time she is somewhat sleepy, and complains of dizziness with head movement.  Additional treatment and testing ordered.  08: 3 0-after  IV fluids, and multiple doses of medications to control symptoms, the patient still remains symptomatic with dizziness.  No dysarthria or aphasia at this time.  Neurologic exam is unchanged.  Patient prefers to stay in the hospital, for further treatment.  8:35 AM-Consult complete with hospitalist. Patient case explained and discussed.  She agrees to admit patient for further evaluation and treatment. Call ended at 08: 4 5  Final Clinical Impressions(s) / ED Diagnoses   Final diagnoses:  Vertigo    Peripheral vertigo, with persistent dizziness.  Doubt CVA, ACS or PE.  Nursing Notes Reviewed/ Care Coordinated Applicable Imaging Reviewed Interpretation of Laboratory Data incorporated into ED treatment  Plan: Admit  New Prescriptions New Prescriptions   No medications on file   I personally performed the services described in this documentation, which was scribed in my presence. The recorded information has been reviewed and is accurate.     Daleen Bo, MD 03/03/17 [phone]

## 2017-03-03 NOTE — ED Notes (Signed)
Pt ambulated in hall with one staff stand by assist. Pt used the rails on the side of the hallway intermittently to catch her breath, and reported dizziness worse than before standing.

## 2017-03-03 NOTE — ED Notes (Signed)
Pt ambulated in hallway with 1 staff assist. Pt gait steady, but reported dizziness.

## 2017-03-03 NOTE — Progress Notes (Signed)
D/c to home w/ dtr via w/c.transferred to car w/ SBA .

## 2017-03-03 NOTE — Progress Notes (Signed)
D/c instructions discussed w/ pt w/ verbal understanding.awaiting ride
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
## 2017-03-12 ENCOUNTER — Inpatient Hospital Stay (HOSPITAL_COMMUNITY)
Admission: EM | Admit: 2017-03-12 | Discharge: 2017-03-21 | DRG: 190 | Disposition: A | Discharge status: Home Health | Payer: Medicare Other | Attending: Internal Medicine | Admitting: Internal Medicine

## 2017-03-12 ENCOUNTER — Emergency Department (HOSPITAL_COMMUNITY): Payer: Medicare Other

## 2017-03-12 ENCOUNTER — Encounter (HOSPITAL_COMMUNITY): Payer: Self-pay

## 2017-03-12 DIAGNOSIS — J449 Chronic obstructive pulmonary disease, unspecified: Secondary | ICD-10-CM | POA: Diagnosis not present

## 2017-03-12 DIAGNOSIS — I700 Atherosclerosis of aorta: Secondary | ICD-10-CM | POA: Diagnosis not present

## 2017-03-12 DIAGNOSIS — R5381 Other malaise: Secondary | ICD-10-CM | POA: Diagnosis not present

## 2017-03-12 DIAGNOSIS — T380X5A Adverse effect of glucocorticoids and synthetic analogues, initial encounter: Secondary | ICD-10-CM | POA: Diagnosis present

## 2017-03-12 DIAGNOSIS — E1121 Type 2 diabetes mellitus with diabetic nephropathy: Secondary | ICD-10-CM | POA: Diagnosis not present

## 2017-03-12 DIAGNOSIS — F419 Anxiety disorder, unspecified: Secondary | ICD-10-CM | POA: Diagnosis present

## 2017-03-12 DIAGNOSIS — I129 Hypertensive chronic kidney disease with stage 1 through stage 4 chronic kidney disease, or unspecified chronic kidney disease: Secondary | ICD-10-CM | POA: Diagnosis not present

## 2017-03-12 DIAGNOSIS — L538 Other specified erythematous conditions: Secondary | ICD-10-CM | POA: Diagnosis present

## 2017-03-12 DIAGNOSIS — R42 Dizziness and giddiness: Secondary | ICD-10-CM | POA: Diagnosis not present

## 2017-03-12 DIAGNOSIS — R269 Unspecified abnormalities of gait and mobility: Secondary | ICD-10-CM | POA: Diagnosis not present

## 2017-03-12 DIAGNOSIS — Z882 Allergy status to sulfonamides status: Secondary | ICD-10-CM

## 2017-03-12 DIAGNOSIS — Z888 Allergy status to other drugs, medicaments and biological substances status: Secondary | ICD-10-CM | POA: Diagnosis not present

## 2017-03-12 DIAGNOSIS — R0602 Shortness of breath: Secondary | ICD-10-CM | POA: Diagnosis not present

## 2017-03-12 DIAGNOSIS — J9601 Acute respiratory failure with hypoxia: Secondary | ICD-10-CM | POA: Diagnosis present

## 2017-03-12 DIAGNOSIS — I714 Abdominal aortic aneurysm, without rupture: Secondary | ICD-10-CM | POA: Diagnosis present

## 2017-03-12 DIAGNOSIS — J441 Chronic obstructive pulmonary disease with (acute) exacerbation: Secondary | ICD-10-CM | POA: Diagnosis not present

## 2017-03-12 DIAGNOSIS — Z720 Tobacco use: Secondary | ICD-10-CM | POA: Diagnosis not present

## 2017-03-12 DIAGNOSIS — Z885 Allergy status to narcotic agent status: Secondary | ICD-10-CM | POA: Diagnosis not present

## 2017-03-12 DIAGNOSIS — K5900 Constipation, unspecified: Secondary | ICD-10-CM | POA: Diagnosis present

## 2017-03-12 DIAGNOSIS — G4733 Obstructive sleep apnea (adult) (pediatric): Secondary | ICD-10-CM | POA: Diagnosis not present

## 2017-03-12 DIAGNOSIS — R110 Nausea: Secondary | ICD-10-CM | POA: Diagnosis not present

## 2017-03-12 DIAGNOSIS — E119 Type 2 diabetes mellitus without complications: Secondary | ICD-10-CM

## 2017-03-12 DIAGNOSIS — F17210 Nicotine dependence, cigarettes, uncomplicated: Secondary | ICD-10-CM | POA: Diagnosis present

## 2017-03-12 DIAGNOSIS — R059 Cough, unspecified: Secondary | ICD-10-CM

## 2017-03-12 DIAGNOSIS — M549 Dorsalgia, unspecified: Secondary | ICD-10-CM | POA: Diagnosis present

## 2017-03-12 DIAGNOSIS — N183 Chronic kidney disease, stage 3 (moderate): Secondary | ICD-10-CM | POA: Diagnosis present

## 2017-03-12 DIAGNOSIS — D72829 Elevated white blood cell count, unspecified: Secondary | ICD-10-CM | POA: Diagnosis present

## 2017-03-12 DIAGNOSIS — R05 Cough: Secondary | ICD-10-CM

## 2017-03-12 DIAGNOSIS — K219 Gastro-esophageal reflux disease without esophagitis: Secondary | ICD-10-CM | POA: Diagnosis present

## 2017-03-12 DIAGNOSIS — I10 Essential (primary) hypertension: Secondary | ICD-10-CM | POA: Diagnosis present

## 2017-03-12 DIAGNOSIS — Z79899 Other long term (current) drug therapy: Secondary | ICD-10-CM | POA: Diagnosis not present

## 2017-03-12 DIAGNOSIS — G8929 Other chronic pain: Secondary | ICD-10-CM | POA: Diagnosis present

## 2017-03-12 DIAGNOSIS — R109 Unspecified abdominal pain: Secondary | ICD-10-CM | POA: Diagnosis not present

## 2017-03-12 DIAGNOSIS — Z7952 Long term (current) use of systemic steroids: Secondary | ICD-10-CM

## 2017-03-12 DIAGNOSIS — E7800 Pure hypercholesterolemia, unspecified: Secondary | ICD-10-CM | POA: Diagnosis present

## 2017-03-12 DIAGNOSIS — Z880 Allergy status to penicillin: Secondary | ICD-10-CM | POA: Diagnosis not present

## 2017-03-12 DIAGNOSIS — R7989 Other specified abnormal findings of blood chemistry: Secondary | ICD-10-CM | POA: Diagnosis not present

## 2017-03-12 DIAGNOSIS — J168 Pneumonia due to other specified infectious organisms: Secondary | ICD-10-CM | POA: Diagnosis not present

## 2017-03-12 DIAGNOSIS — Z7984 Long term (current) use of oral hypoglycemic drugs: Secondary | ICD-10-CM

## 2017-03-12 DIAGNOSIS — E1136 Type 2 diabetes mellitus with diabetic cataract: Secondary | ICD-10-CM | POA: Diagnosis present

## 2017-03-12 LAB — COMPREHENSIVE METABOLIC PANEL
ALBUMIN: 3.7 g/dL (ref 3.5–5.0)
ALT: 20 U/L (ref 14–54)
ANION GAP: 5 (ref 5–15)
AST: 20 U/L (ref 15–41)
Alkaline Phosphatase: 73 U/L (ref 38–126)
BILIRUBIN TOTAL: 0.9 mg/dL (ref 0.3–1.2)
BUN: 20 mg/dL (ref 6–20)
CHLORIDE: 106 mmol/L (ref 101–111)
CO2: 27 mmol/L (ref 22–32)
Calcium: 8.9 mg/dL (ref 8.9–10.3)
Creatinine, Ser: 1.23 mg/dL — ABNORMAL HIGH (ref 0.44–1.00)
GFR calc Af Amer: 51 mL/min — ABNORMAL LOW (ref >60)
GFR calc non Af Amer: 44 mL/min — ABNORMAL LOW (ref >60)
GLUCOSE: 93 mg/dL (ref 65–99)
Potassium: 4 mmol/L (ref 3.5–5.1)
Sodium: 138 mmol/L (ref 135–145)
TOTAL PROTEIN: 6.9 g/dL (ref 6.5–8.1)

## 2017-03-12 LAB — CBC WITH DIFFERENTIAL/PLATELET
BASOS PCT: 0 %
Basophils Absolute: 0 10*3/uL (ref 0.0–0.1)
EOS ABS: 0.3 10*3/uL (ref 0.0–0.7)
EOS PCT: 3 %
HEMATOCRIT: 45.4 % (ref 36.0–46.0)
Hemoglobin: 15.2 g/dL — ABNORMAL HIGH (ref 12.0–15.0)
Lymphocytes Relative: 29 %
Lymphs Abs: 2.5 10*3/uL (ref 0.7–4.0)
MCH: 29.1 pg (ref 26.0–34.0)
MCHC: 33.5 g/dL (ref 30.0–36.0)
MCV: 87 fL (ref 78.0–100.0)
MONO ABS: 0.8 10*3/uL (ref 0.1–1.0)
MONOS PCT: 9 %
NEUTROS ABS: 5.2 10*3/uL (ref 1.7–7.7)
Neutrophils Relative %: 59 %
PLATELETS: 199 10*3/uL (ref 150–400)
RBC: 5.22 MIL/uL — ABNORMAL HIGH (ref 3.87–5.11)
RDW: 14.5 % (ref 11.5–15.5)
WBC: 8.8 10*3/uL (ref 4.0–10.5)

## 2017-03-12 LAB — CBG MONITORING, ED: GLUCOSE-CAPILLARY: 255 mg/dL — AB (ref 65–99)

## 2017-03-12 MED ORDER — METHYLPREDNISOLONE SODIUM SUCC 125 MG IJ SOLR
60.0000 mg | Freq: Two times a day (BID) | INTRAMUSCULAR | Status: DC
  Administered 2017-03-13 (×2): 60 mg via INTRAVENOUS
  Filled 2017-03-12 (×2): qty 2

## 2017-03-12 MED ORDER — IPRATROPIUM BROMIDE 0.02 % IN SOLN
1.0000 mg | Freq: Once | RESPIRATORY_TRACT | Status: AC
  Administered 2017-03-12: 1 mg via RESPIRATORY_TRACT

## 2017-03-12 MED ORDER — IPRATROPIUM BROMIDE 0.02 % IN SOLN
RESPIRATORY_TRACT | Status: AC
  Filled 2017-03-12: qty 5

## 2017-03-12 MED ORDER — ALBUTEROL (5 MG/ML) CONTINUOUS INHALATION SOLN
10.0000 mg/h | INHALATION_SOLUTION | Freq: Once | RESPIRATORY_TRACT | Status: AC
  Administered 2017-03-12: 10 mg/h via RESPIRATORY_TRACT

## 2017-03-12 MED ORDER — ALBUTEROL (5 MG/ML) CONTINUOUS INHALATION SOLN
15.0000 mg/h | INHALATION_SOLUTION | Freq: Once | RESPIRATORY_TRACT | Status: AC
  Administered 2017-03-12: 15 mg/h via RESPIRATORY_TRACT
  Filled 2017-03-12: qty 20

## 2017-03-12 MED ORDER — BENZONATATE 100 MG PO CAPS
200.0000 mg | ORAL_CAPSULE | Freq: Three times a day (TID) | ORAL | Status: DC | PRN
  Administered 2017-03-12 – 2017-03-14 (×3): 200 mg via ORAL
  Filled 2017-03-12 (×3): qty 2

## 2017-03-12 MED ORDER — ALBUTEROL SULFATE 0.63 MG/3ML IN NEBU: 1.0000 | INHALATION_SOLUTION | Freq: Four times a day (QID) | RESPIRATORY_TRACT | Status: DC | PRN

## 2017-03-12 MED ORDER — SODIUM CHLORIDE 0.9 % IV BOLUS (SEPSIS): 1000.0000 mL | Freq: Once | INTRAVENOUS | Status: DC

## 2017-03-12 MED ORDER — SODIUM CHLORIDE 0.9 % IV BOLUS (SEPSIS): 250.0000 mL | Freq: Once | INTRAVENOUS | Status: DC

## 2017-03-12 MED ORDER — PANTOPRAZOLE SODIUM 40 MG PO TBEC
40.0000 mg | DELAYED_RELEASE_TABLET | Freq: Every day | ORAL | Status: DC | PRN
  Administered 2017-03-15: 40 mg via ORAL
  Filled 2017-03-12: qty 1

## 2017-03-12 MED ORDER — LEVOFLOXACIN 750 MG PO TABS: 750.0000 mg | ORAL_TABLET | Freq: Every day | ORAL | Status: DC

## 2017-03-12 MED ORDER — ALBUTEROL SULFATE (2.5 MG/3ML) 0.083% IN NEBU
2.5000 mg | INHALATION_SOLUTION | RESPIRATORY_TRACT | Status: DC | PRN
  Administered 2017-03-13: 2.5 mg via RESPIRATORY_TRACT
  Filled 2017-03-12: qty 3

## 2017-03-12 MED ORDER — LOSARTAN POTASSIUM 50 MG PO TABS
100.0000 mg | ORAL_TABLET | Freq: Every day | ORAL | Status: DC
  Administered 2017-03-13 – 2017-03-21 (×9): 100 mg via ORAL
  Filled 2017-03-12 (×10): qty 2

## 2017-03-12 MED ORDER — LEVOFLOXACIN 750 MG PO TABS
750.0000 mg | ORAL_TABLET | ORAL | Status: DC
  Administered 2017-03-12 – 2017-03-18 (×4): 750 mg via ORAL
  Filled 2017-03-12 (×5): qty 1

## 2017-03-12 MED ORDER — FENTANYL CITRATE (PF) 100 MCG/2ML IJ SOLN
100.0000 ug | Freq: Once | INTRAMUSCULAR | Status: AC
  Administered 2017-03-12: 100 ug via INTRAVENOUS
  Filled 2017-03-12: qty 2

## 2017-03-12 MED ORDER — LOSARTAN POTASSIUM-HCTZ 100-12.5 MG PO TABS: 1.0000 | ORAL_TABLET | Freq: Every day | ORAL | Status: DC

## 2017-03-12 MED ORDER — ENOXAPARIN SODIUM 40 MG/0.4ML ~~LOC~~ SOLN
40.0000 mg | Freq: Every day | SUBCUTANEOUS | Status: DC
  Administered 2017-03-13 – 2017-03-20 (×8): 40 mg via SUBCUTANEOUS
  Filled 2017-03-12 (×8): qty 0.4

## 2017-03-12 MED ORDER — ALBUTEROL SULFATE HFA 108 (90 BASE) MCG/ACT IN AERS: 2.0000 | INHALATION_SPRAY | Freq: Four times a day (QID) | RESPIRATORY_TRACT | Status: DC | PRN

## 2017-03-12 MED ORDER — TIOTROPIUM BROMIDE MONOHYDRATE 18 MCG IN CAPS
18.0000 ug | ORAL_CAPSULE | Freq: Every day | RESPIRATORY_TRACT | Status: DC
  Administered 2017-03-13 – 2017-03-21 (×9): 18 ug via RESPIRATORY_TRACT
  Filled 2017-03-12 (×2): qty 5

## 2017-03-12 MED ORDER — HYDROCHLOROTHIAZIDE 12.5 MG PO CAPS
12.5000 mg | ORAL_CAPSULE | Freq: Every day | ORAL | Status: DC
  Administered 2017-03-13: 12.5 mg via ORAL
  Filled 2017-03-12: qty 1

## 2017-03-12 MED ORDER — METHYLPREDNISOLONE SODIUM SUCC 125 MG IJ SOLR
125.0000 mg | Freq: Once | INTRAMUSCULAR | Status: AC
  Administered 2017-03-12: 125 mg via INTRAVENOUS
  Filled 2017-03-12: qty 2

## 2017-03-12 MED ORDER — PREDNISONE 50 MG PO TABS
60.0000 mg | ORAL_TABLET | Freq: Once | ORAL | Status: DC
  Filled 2017-03-12: qty 3

## 2017-03-12 NOTE — Progress Notes (Signed)
PHARMACY NOTE:  ANTIMICROBIAL RENAL DOSAGE ADJUSTMENT  Current antimicrobial regimen includes a mismatch between antimicrobial dosage and estimated renal function.  As per policy approved by the Pharmacy & Therapeutics and Medical Executive Committees, the antimicrobial dosage will be adjusted accordingly.  Current antimicrobial dosage:  Levaquin 750mg  po q24h  Indication: COPD exacerbation  Renal Function:  Estimated Creatinine Clearance: 41 mL/min (A) (by C-G formula based on SCr of 1.23 mg/dL (H)). []      On intermittent HD, scheduled: []      On CRRT    Antimicrobial dosage has been changed to:  Levaquin 750mg  PO q48h  Thank you for allowing pharmacy to be a part of this patient's care.  Everette Rank, [facility] 03/12/2017 9:26 PM

## 2017-03-12 NOTE — ED Notes (Signed)
Pt taken off O2.  Pt maintained O2 sat between 76 and 82% on Room Air.

## 2017-03-12 NOTE — H&P (Signed)
History and Physical    Victoria Bird:[id number] DOB: 1945/10/04 DOA: 03/12/2017  PCP: Hoyt Koch, MD  Patient coming from: Home  I have personally briefly reviewed patient's old medical records in [place]  Chief Complaint: Cough, SOB  HPI: Victoria Bird is a 71 y.o. female with medical history significant of COPD, HTN, ?DM (no meds), cataracts.  Patient was actually supposed to have eye surgery today for cataract in eye but this was canceled.  Patient presents to the ED with several day history of SOB, wheezing, cough.  She has been unable to smoke for past 2 days due to severity of SOB.  Of note patient was admitted to hospital for observation for vertigo last week.   ED Course: Given solumedrol, breathing treatments.  O2 sat was between 76 and 82% on RA with ambulation.   Review of Systems: As per HPI otherwise 10 point review of systems negative.   Past Medical History:  Diagnosis Date  . Anxiety   . Chronic pain   . Colon polyps    adenomatous  . COPD (chronic obstructive pulmonary disease) ([place])   . Diabetes mellitus   . Diverticulitis   . Diverticulosis   . Emphysema lung ([place])   . Gall stones   . GERD (gastroesophageal reflux disease)   . H/O: pneumonia   . High cholesterol   . Hypertension   . OSA (obstructive sleep apnea)    no cpap  . Tracheobronchitis 01/01/2012    Past Surgical History:  Procedure Laterality Date  . ABDOMINAL HYSTERECTOMY    . APPENDECTOMY    . Bilateral shoulder surgery    . CARPAL TUNNEL RELEASE Left   . CHOLECYSTECTOMY    . COLONOSCOPY    . ESOPHAGOGASTRODUODENOSCOPY    . FRACTURE SURGERY    . Right knee surgery    . ROTATOR CUFF REPAIR       reports that she has been smoking Cigarettes.  She started smoking about 43 years ago. She has a 61.50 pack-year smoking history. She has never used smokeless tobacco. She reports that she does not drink alcohol or use drugs.  Allergies  Allergen Reactions    . Aspartame And Phenylalanine Nausea And Vomiting    Patient says she is allergic to all artificial sweeteners  . Doxycycline Other (See Comments)    Nausea, vomiting, HA, double vision  . Tramadol Shortness Of Breath and Nausea Only  . Codeine Itching    Has not tried benadryl to alleviate side effects  . Neurontin [Gabapentin] Other (See Comments)    Dizzy, "drugged" feeling, sleepy  . Sulfa Antibiotics Other (See Comments)    Kidney problem   . Symbicort [Budesonide-Formoterol Fumarate]     Swelling of face and inside of mouth per pt  . Penicillins Rash    Has patient had a PCN reaction causing immediate rash, facial/tongue/throat swelling, SOB or lightheadedness with hypotension: No Has patient had a PCN reaction causing severe rash involving mucus membranes or skin necrosis: No Has patient had a PCN reaction that required hospitalization No Has patient had a PCN reaction occurring within the last 10 years: No If all of the above answers are "NO", then may proceed with Cephalosporin use.  . Prednisone Rash    Patient stated she received Prednisone while she was in the hospital and experienced a rash and "extreme pain.'    Family History  Problem Relation Age of Onset  . Heart attack Mother   .  Diabetes Maternal Grandfather   . Heart attack Maternal Grandfather   . Hypertension Maternal Grandfather   . Stroke Maternal Grandfather   . Heart attack Maternal Grandmother   . Hypertension Maternal Grandmother   . Alcohol abuse Father   . Thyroid disease Neg Hx   . Lung disease Neg Hx   . Colon cancer Neg Hx   . Breast cancer Neg Hx      Prior to Admission medications   Medication Sig Start Date End Date Taking? Authorizing Provider  albuterol (ACCUNEB) 0.63 MG/3ML nebulizer solution Take 1 ampule by nebulization every 6 (six) hours as needed for wheezing or shortness of breath.   Yes [provider]  albuterol (PROAIR HFA) 108 (90 Base) MCG/ACT inhaler Inhale 2  puffs into the lungs every 6 (six) hours as needed for wheezing or shortness of breath. 10/17/16  Yes Nestor, Sonia Baller, MD  BESIVANCE 0.6 % SUSP Place 1 drop into the right eye 3 (three) times daily. 03/01/17  Yes [provider]  DUREZOL 0.05 % EMUL Place 1 drop into the right eye 2 (two) times daily. 03/01/17  Yes [provider]  losartan-hydrochlorothiazide (HYZAAR) 100-12.5 MG tablet Take 1 tablet by mouth daily.    Yes [provider]  metFORMIN (GLUCOPHAGE) 500 MG tablet TAKE 1 TABLET(500 MG) BY MOUTH TWICE DAILY WITH A MEAL 02/15/17  Yes Hoyt Koch, MD  pantoprazole (PROTONIX) 40 MG tablet Take 1 tablet (40 mg total) by mouth daily. Patient taking differently: Take 40 mg by mouth daily as needed (indigestion).  02/25/16  Yes Hoyt Koch, MD  PROLENSA 0.07 % SOLN Place 1 drop into the right eye at bedtime. 02/20/17  Yes [provider]  SPIRIVA HANDIHALER 18 MCG inhalation capsule PLACE 1 CAPSULE INTO INHALER AND INHALE DAILY 09/04/16  Yes Javier Glazier, MD  meclizine (ANTIVERT) 12.5 MG tablet Take 1 tablet (12.5 mg total) by mouth every 6 (six) hours as needed for dizziness or nausea. Patient not taking: Reported on 03/12/2017 03/03/17   [name], MD  Spacer/Aero-Holding Chambers (AEROCHAMBER MV) inhaler Use as instructed 07/19/16   Javier Glazier, MD    Physical Exam: Vitals:   03/12/17 1900 03/12/17 1913 03/12/17 1930 03/12/17 2100  BP: 136/65 136/65 (!) 131/58 (!) 107/49  Pulse: (!) 103 (!) 104 98 92  Resp:  20  (!) 23  Temp:      TempSrc:      SpO2: 91% 95% 95% 93%  Weight:      Height:        Constitutional: NAD, calm, comfortable Eyes: PERRL, lids and conjunctivae normal ENMT: Mucous membranes are moist. Posterior pharynx clear of any exudate or lesions.Normal dentition.  Neck: normal, supple, no masses, no thyromegaly Respiratory: Diffuse wheezing Cardiovascular: Regular rate and rhythm, no murmurs / rubs  / gallops. No extremity edema. 2+ pedal pulses. No carotid bruits.  Abdomen: no tenderness, no masses palpated. No hepatosplenomegaly. Bowel sounds positive.  Musculoskeletal: no clubbing / cyanosis. No joint deformity upper and lower extremities. Good ROM, no contractures. Normal muscle tone.  Skin: no rashes, lesions, ulcers. No induration Neurologic: CN 2-12 grossly intact. Sensation intact, DTR normal. Strength 5/5 in all 4.  Psychiatric: Normal judgment and insight. Alert and oriented x 3. Normal mood.    Labs on Admission: I have personally reviewed following labs and imaging studies  CBC:  Recent Labs Lab 03/12/17 1548  WBC 8.8  NEUTROABS 5.2  HGB 15.2*  HCT 45.4  MCV 87.0  PLT 545   Basic Metabolic Panel:  Recent Labs Lab 03/12/17 1548  NA 138  K 4.0  CL 106  CO2 27  GLUCOSE 93  BUN 20  CREATININE 1.23*  CALCIUM 8.9   GFR: Estimated Creatinine Clearance: 41 mL/min (A) (by C-G formula based on SCr of 1.23 mg/dL (H)). Liver Function Tests:  Recent Labs Lab 03/12/17 1548  AST 20  ALT 20  ALKPHOS 73  BILITOT 0.9  PROT 6.9  ALBUMIN 3.7   No results for input(s): LIPASE, AMYLASE in the last 168 hours. No results for input(s): AMMONIA in the last 168 hours. Coagulation Profile: No results for input(s): INR, PROTIME in the last 168 hours. Cardiac Enzymes: No results for input(s): CKTOTAL, CKMB, CKMBINDEX, TROPONINI in the last 168 hours. BNP (last 3 results) No results for input(s): PROBNP in the last 8760 hours. HbA1C: No results for input(s): HGBA1C in the last 72 hours. CBG: No results for input(s): GLUCAP in the last 168 hours. Lipid Profile: No results for input(s): CHOL, HDL, LDLCALC, TRIG, CHOLHDL, LDLDIRECT in the last 72 hours. Thyroid Function Tests: No results for input(s): TSH, T4TOTAL, FREET4, T3FREE, THYROIDAB in the last 72 hours. Anemia Panel: No results for input(s): VITAMINB12, FOLATE, FERRITIN, TIBC, IRON, RETICCTPCT in the last 72  hours. Urine analysis:    Component Value Date/Time   COLORURINE YELLOW 03/02/2017 2245   APPEARANCEUR HAZY (A) 03/02/2017 2245   LABSPEC 1.008 03/02/2017 2245   PHURINE 5.0 03/02/2017 2245   GLUCOSEU NEGATIVE 03/02/2017 2245   HGBUR NEGATIVE 03/02/2017 2245   BILIRUBINUR NEGATIVE 03/02/2017 2245   KETONESUR NEGATIVE 03/02/2017 2245   PROTEINUR NEGATIVE 03/02/2017 2245   UROBILINOGEN 1.0 06/16/2015 1545   NITRITE NEGATIVE 03/02/2017 2245   LEUKOCYTESUR NEGATIVE 03/02/2017 2245    Radiological Exams on Admission: Dg Chest Port 1 View  Result Date: 03/12/2017 CLINICAL DATA:  Shortness of breath and nonproductive cough. EXAM: PORTABLE CHEST 1 VIEW COMPARISON:  12/15/2016 FINDINGS: 1531 hours. Lungs are hyperexpanded right hilar fullness similar to prior studies back to 04/09/2013. Interstitial markings are diffusely coarsened with chronic features. The lungs are clear without focal pneumonia, edema, pneumothorax or pleural effusion. The visualized bony structures of the thorax are intact. Telemetry leads overlie the chest. IMPRESSION: Chronic interstitial coarsening.  No acute cardiopulmonary findings. Electronically Signed   By: Misty Stanley M.D.   On: 03/12/2017 16:47    EKG: Independently reviewed.  Assessment/Plan Principal Problem:   COPD with acute exacerbation ([name]) Active Problems:   Hypertension   Acute respiratory failure with hypoxia ([name])   Diabetes mellitus type 2, controlled ([place])    1. COPD with acute exacerbation causing new O2 requirement - 1. Cont pulse ox and O2 via [place] 2. Solumedrol 60mg  IV Q12H 1. Try to wean down as soon as possible due to patient already having trouble with cataracts; however, as I discussed with patient, breathing takes priority at the moment with her presenting to ED with hypoxia. 3. COPD pathway 4. levaquin 5. Spiriva 6. Adult wheeze protocol and albuterol PRN 2. ?DM2 - CBG checks AC/HS, add SSI if needed 3. HTN - cont Hyzaar  DVT  prophylaxis: Lovenox Code Status: Full Family Communication: No family in room Disposition Plan: Home after admit Consults called: None Admission status: Admit to inpatient - new O2 requirement and desatting down to 82% on room air.   Etta Quill DO [facility] Pager [phone]  If 7AM-7PM, please contact day team taking care of  patient [email or web address] Password [name]  03/12/2017, 9:27 PM

## 2017-03-12 NOTE — ED Provider Notes (Signed)
[facility] DEPT Provider Note   CSN: [id number] Arrival date & time: 03/12/17  1421     History   Chief Complaint Chief Complaint  Patient presents with  . Shortness of Breath  . Cough    HPI Victoria Bird is a 71 y.o. female.  She presents for evaluation of cough with shortness of breath for several days.  She is a smoker, but been unable to smoke for 2 days because of shortness of breath.  Today, she used her nebulizer 4 times prior to coming here for evaluation.  She denies any recent exacerbations of her COPD.  She denies fever, chills, nausea, vomiting, abdominal pain or back pain.  Her chest is sore from coughing.  The cough is productive of yellowish sputum.  No known sick contacts.  There are no other known modifying factors.  HPI  Past Medical History:  Diagnosis Date  . Anxiety   . Chronic pain   . Colon polyps    adenomatous  . COPD (chronic obstructive pulmonary disease) ([place])   . Diabetes mellitus   . Diverticulitis   . Diverticulosis   . Emphysema lung ([place])   . Gall stones   . GERD (gastroesophageal reflux disease)   . H/O: pneumonia   . High cholesterol   . Hypertension   . OSA (obstructive sleep apnea)    no cpap  . Tracheobronchitis 01/01/2012    Patient Active Problem List   Diagnosis Date Noted  . Vertigo 03/03/2017  . Fibromyalgia 01/18/2017  . Tobacco abuse 12/17/2016  . CKD (chronic kidney disease), stage III 12/15/2016  . COPD with acute exacerbation ([place]) 12/15/2016  . AAA (abdominal aortic aneurysm) without rupture ([place]) 06/08/2016  . Right-sided low back pain without sciatica   . Adjustment disorder with mixed anxiety and depressed mood 02/25/2016  . Overweight (BMI 25.0-29.9) 01/25/2016  . Allergic rhinitis 11/17/2015  . Tobacco use disorder 06/22/2015  . Diabetes mellitus type 2, controlled ([place]) 06/16/2015  . H/O recurrent pneumonia 05/27/2015  . Acute on chronic respiratory failure with hypoxia ([place]) 05/27/2015  . GERD  (gastroesophageal reflux disease) 05/27/2015  . Hyperlipidemia 05/27/2015  . Chronic pain syndrome 05/27/2015  . Thyroid nodule 01/20/2015  . COPD (chronic obstructive pulmonary disease) ([place]) 10/09/2014  . COPD exacerbation ([place]) 04/10/2013  . Acute respiratory failure with hypoxia ([place]) 04/10/2013  . Hypertension 12/14/2011  . OSA (obstructive sleep apnea) 10/02/2008    Past Surgical History:  Procedure Laterality Date  . ABDOMINAL HYSTERECTOMY    . APPENDECTOMY    . Bilateral shoulder surgery    . CARPAL TUNNEL RELEASE Left   . CHOLECYSTECTOMY    . COLONOSCOPY    . ESOPHAGOGASTRODUODENOSCOPY    . FRACTURE SURGERY    . Right knee surgery    . ROTATOR CUFF REPAIR      OB History    No data available       Home Medications    Prior to Admission medications   Medication Sig Start Date End Date Taking? Authorizing Provider  albuterol (ACCUNEB) 0.63 MG/3ML nebulizer solution Take 1 ampule by nebulization every 6 (six) hours as needed for wheezing or shortness of breath.   Yes [provider]  albuterol (PROAIR HFA) 108 (90 Base) MCG/ACT inhaler Inhale 2 puffs into the lungs every 6 (six) hours as needed for wheezing or shortness of breath. 10/17/16  Yes Nestor, Sonia Baller, MD  BESIVANCE 0.6 % SUSP Place 1 drop into the right eye 3 (three) times daily.  03/01/17  Yes [provider]  DUREZOL 0.05 % EMUL Place 1 drop into the right eye 2 (two) times daily. 03/01/17  Yes [provider]  losartan-hydrochlorothiazide (HYZAAR) 100-12.5 MG tablet Take 1 tablet by mouth daily.    Yes [provider]  metFORMIN (GLUCOPHAGE) 500 MG tablet TAKE 1 TABLET(500 MG) BY MOUTH TWICE DAILY WITH A MEAL 02/15/17  Yes Hoyt Koch, MD  pantoprazole (PROTONIX) 40 MG tablet Take 1 tablet (40 mg total) by mouth daily. Patient taking differently: Take 40 mg by mouth daily as needed (indigestion).  02/25/16  Yes Hoyt Koch, MD  PROLENSA 0.07 % SOLN  Place 1 drop into the right eye at bedtime. 02/20/17  Yes [provider]  SPIRIVA HANDIHALER 18 MCG inhalation capsule PLACE 1 CAPSULE INTO INHALER AND INHALE DAILY 09/04/16  Yes Javier Glazier, MD  meclizine (ANTIVERT) 12.5 MG tablet Take 1 tablet (12.5 mg total) by mouth every 6 (six) hours as needed for dizziness or nausea. Patient not taking: Reported on 03/12/2017 03/03/17   [name], MD  Spacer/Aero-Holding Chambers (AEROCHAMBER MV) inhaler Use as instructed 07/19/16   Javier Glazier, MD    Family History Family History  Problem Relation Age of Onset  . Heart attack Mother   . Diabetes Maternal Grandfather   . Heart attack Maternal Grandfather   . Hypertension Maternal Grandfather   . Stroke Maternal Grandfather   . Heart attack Maternal Grandmother   . Hypertension Maternal Grandmother   . Alcohol abuse Father   . Thyroid disease Neg Hx   . Lung disease Neg Hx   . Colon cancer Neg Hx   . Breast cancer Neg Hx     Social History Social History  Substance Use Topics  . Smoking status: Current Some Day Smoker    Packs/day: 1.50    Years: 41.00    Types: Cigarettes    Start date: 03/19/1974  . Smokeless tobacco: Never Used     Comment: daily amount of cigarettes varies depending on stress level   . Alcohol use No     Allergies   Aspartame and phenylalanine; Doxycycline; Tramadol; Codeine; Neurontin [gabapentin]; Sulfa antibiotics; Symbicort [budesonide-formoterol fumarate]; Penicillins; and Prednisone   Review of Systems Review of Systems  All other systems reviewed and are negative.    Physical Exam Updated Vital Signs BP (!) 131/58 Comment: On continuous neb  Pulse 98 Comment: On continuous neb  Temp 98.1 F (36.7 C) (Oral)   Resp 20   Ht 5\' 3"  (1.6 m)   Wt 71.7 kg (158 lb)   SpO2 95% Comment: On continuous neb  BMI 27.99 kg/m   Physical Exam  Constitutional: She is oriented to person, place, and time. She appears well-developed.  She appears distressed (She is uncomfortable).  Appears older than stated age.  HENT:  Head: Normocephalic and atraumatic.  Eyes: Conjunctivae and EOM are normal. Pupils are equal, round, and reactive to light.  Neck: Normal range of motion and phonation normal. Neck supple.  Cardiovascular: Normal rate and regular rhythm.   Pulmonary/Chest: No respiratory distress. She exhibits no tenderness.  Mild increased work of breathing.  Decreased air movement bilaterally with generalized wheezing on expiration.  Abdominal: Soft. She exhibits no distension. There is no tenderness. There is no guarding.  Musculoskeletal: Normal range of motion. She exhibits no edema or deformity.  Neurological: She is alert and oriented to person, place, and time. She exhibits normal muscle tone.  Skin: Skin is  warm and dry.  Psychiatric: She has a normal mood and affect. Her behavior is normal. Judgment and thought content normal.  Nursing note and vitals reviewed.    ED Treatments / Results  Labs (all labs ordered are listed, but only abnormal results are displayed) Labs Reviewed  CBC WITH DIFFERENTIAL/PLATELET - Abnormal; Notable for the following:       Result Value   RBC 5.22 (*)    Hemoglobin 15.2 (*)    All other components within normal limits  COMPREHENSIVE METABOLIC PANEL - Abnormal; Notable for the following:    Creatinine, Ser 1.23 (*)    GFR calc non Af Amer 44 (*)    GFR calc Af Amer 51 (*)    All other components within normal limits    EKG  EKG Interpretation None       Radiology Dg Chest Port 1 View  Result Date: 03/12/2017 CLINICAL DATA:  Shortness of breath and nonproductive cough. EXAM: PORTABLE CHEST 1 VIEW COMPARISON:  12/15/2016 FINDINGS: 1531 hours. Lungs are hyperexpanded right hilar fullness similar to prior studies back to 04/09/2013. Interstitial markings are diffusely coarsened with chronic features. The lungs are clear without focal pneumonia, edema, pneumothorax or  pleural effusion. The visualized bony structures of the thorax are intact. Telemetry leads overlie the chest. IMPRESSION: Chronic interstitial coarsening.  No acute cardiopulmonary findings. Electronically Signed   By: Misty Stanley M.D.   On: 03/12/2017 16:47    Procedures Procedures (including critical care time)  Medications Ordered in ED Medications  ipratropium (ATROVENT) 0.02 % nebulizer solution (  Not Given 03/12/17 1540)  predniSONE (DELTASONE) tablet 60 mg (60 mg Oral Not Given 03/12/17 1626)  albuterol (PROVENTIL,VENTOLIN) solution continuous neb (15 mg/hr Nebulization Given 03/12/17 1540)  ipratropium (ATROVENT) nebulizer solution 1 mg (1 mg Nebulization Given 03/12/17 1541)  methylPREDNISolone sodium succinate (SOLU-MEDROL) 125 mg/2 mL injection 125 mg (125 mg Intravenous Given 03/12/17 1643)  albuterol (PROVENTIL,VENTOLIN) solution continuous neb (10 mg/hr Nebulization Given 03/12/17 1857)  fentaNYL (SUBLIMAZE) injection 100 mcg (100 mcg Intravenous Given 03/12/17 1929)     Initial Impression / Assessment and Plan / ED Course  I have reviewed the triage vital signs and the nursing notes.  Pertinent labs & imaging results that were available during my care of the patient were reviewed by me and considered in my medical decision making (see chart for details).      Patient Vitals for the past 24 hrs:  BP Temp Temp src Pulse Resp SpO2 Height Weight  03/12/17 1930 (!) 131/58 - - 98 - 95 % - -  03/12/17 1913 136/65 - - (!) 104 20 95 % - -  03/12/17 1900 136/65 - - (!) 103 - 91 % - -  03/12/17 1857 - - - (!) 106 (!) 24 94 % - -  03/12/17 1830 (!) 146/61 - - 95 - 91 % - -  03/12/17 1738 - - - - - (!) 89 % - -  03/12/17 1730 (!) 127/57 - - (!) 105 (!) 23 (!) 84 % - -  03/12/17 1700 (!) 144/76 - - (!) 102 (!) 25 94 % - -  03/12/17 1630 (!) 151/76 - - 92 (!) 29 97 % - -  03/12/17 1600 (!) 148/125 - - 87 (!) 25 97 % - -  03/12/17 1541 - - - 83 (!) 26 94 % - -  03/12/17 1530 (!)  134/123 - - 93 (!) 24 97 % - -  03/12/17 1520  139/75 - - 90 - 92 % - -  03/12/17 1456 (!) 118/58 - - - - - - -  03/12/17 1431 122/69 98.1 F (36.7 C) Oral 96 18 97 % 5\' 3"  (1.6 m) 71.7 kg (158 lb)    8:47 PM Reevaluation with update and discussion. After initial assessment and treatment, an updated evaluation reveals trial off oxygen, field, patient dropped oxygen saturation to mid 80s.  Findings discussed with the patient and all questions were answered. Rollen Selders L    8:51 PM-Consult complete with hospitalist. Patient case explained and discussed.  He agrees to admit patient for further evaluation and treatment. Call ended at 21: 02  CRITICAL CARE Performed by: Daleen Bo L Total critical care time: 45 minutes Critical care time was exclusive of separately billable procedures and treating other patients. Critical care was necessary to treat or prevent imminent or life-threatening deterioration. Critical care was time spent personally by me on the following activities: development of treatment plan with patient and/or surrogate as well as nursing, discussions with consultants, evaluation of patient's response to treatment, examination of patient, obtaining history from patient or surrogate, ordering and performing treatments and interventions, ordering and review of laboratory studies, ordering and review of radiographic studies, pulse oximetry and re-evaluation of patient's condition.  Final Clinical Impressions(s) / ED Diagnoses   Final diagnoses:  COPD exacerbation ([place])  Acute respiratory failure with hypoxia ([name])  Tobacco abuse    COPD exacerbation, with hypoxia, and no improvement with aggressive management in the emergency department.  She will require admission for further treatment and stabilization.  Nursing Notes Reviewed/ Care Coordinated Applicable Imaging Reviewed Interpretation of Laboratory Data incorporated into ED treatment  Plan: Admit  New  Prescriptions New Prescriptions   No medications on file     Daleen Bo, MD 03/12/17 2104

## 2017-03-12 NOTE — ED Notes (Signed)
O2 began at 2L via [place].

## 2017-03-12 NOTE — ED Triage Notes (Signed)
Patient c/o SOB and a non productive cough x 3 days. Patient state she was supposed to have eye surgery today, but cancelled.

## 2017-03-12 NOTE — ED Notes (Signed)
Pt O2 82 when ambulating

## 2017-03-12 NOTE — Clinical Social Work Note (Addendum)
Clinical Social Work Assessment  Patient Details  Name: Victoria Bird MRN: [id number] Date of Birth: May 17, 1946  Date of referral:  03/12/17               Reason for consult:  Facility Placement                Permission sought to share information with:  Chartered certified accountant granted to share information::  Yes, Verbal Permission Granted  Name::        Agency::     Relationship::     Contact Information:     Housing/Transportation Living arrangements for the past 2 months:  Single Family Home Source of Information:  Patient Patient Interpreter Needed:  None Criminal Activity/Legal Involvement Pertinent to Current Situation/Hospitalization:    Significant Relationships:   (Pt's grandson) Lives with:   [name], grandson's girlfriend and two grandchildren) Do you feel safe going back to the place where you live?  Yes Need for family participation in patient care:  No (Coment)  Care giving concerns:  Pt states she has a difficult time standing up to cook and expressed a desire for Meals on Wheels.  CSW will provide information to pt.   Social Worker assessment / plan:  CSW met with pt and confirmed pt's plan to be discharged to SNF to live at discharge if PT recommends.  CSW provided active listening and validated pt's concerns over a need for assistance with meals and how pt's grandson's girlfriend does not "like her".   Pt gave permission for CSW Dept to complete FL-2 and send referrals out to SNF facilities via the hub per pt's request.  Pt has been living with her grandson, her grandson's girlfriend and her grandson's two children, prior to being admitted to [facility].  Employment status:  Retired Nurse, adult PT Recommendations:  Not assessed at this time Information / Referral to community resources:  [place]  Patient/Family's Response to care:  Patient alert and oriented.  Patient agreeable to plan. Pt's grandson  was not present at bedside.  Pt pleasant and appreciated CSW intervention.    Patient/Family's Understanding of and Emotional Response to Diagnosis, Current Treatment, and Prognosis:  Still assessing\  Emotional Assessment Appearance:  Appears stated age Attitude/Demeanor/Rapport:    Affect (typically observed):  Accepting, Adaptable, Calm, Pleasant Orientation:  Oriented to Self, Oriented to Place, Oriented to  Time, Oriented to Situation Alcohol / Substance use:    Psych involvement (Current and /or in the community):     Discharge Needs  Concerns to be addressed:   (Meals on Wheels) Readmission within the last 30 days:  No Current discharge risk:  None Barriers to Discharge:  No Barriers Identified   Claudine Mouton, LCSWA 03/12/2017, 10:33 PM

## 2017-03-13 LAB — FERRITIN: Ferritin: 92 ng/mL (ref 11–307)

## 2017-03-13 LAB — GLUCOSE, CAPILLARY
GLUCOSE-CAPILLARY: 174 mg/dL — AB (ref 65–99)
GLUCOSE-CAPILLARY: 194 mg/dL — AB (ref 65–99)
Glucose-Capillary: 202 mg/dL — ABNORMAL HIGH (ref 65–99)
Glucose-Capillary: 160 mg/dL — ABNORMAL HIGH (ref 65–99)

## 2017-03-13 LAB — IRON AND TIBC
Iron: 58 ug/dL (ref 28–170)
Saturation Ratios: 18 % (ref 10.4–31.8)
TIBC: 316 ug/dL (ref 250–450)
UIBC: 258 ug/dL

## 2017-03-13 MED ORDER — HYDROCODONE-ACETAMINOPHEN 5-325 MG PO TABS
1.0000 | ORAL_TABLET | ORAL | Status: DC | PRN
  Administered 2017-03-13 – 2017-03-14 (×2): 2 via ORAL
  Administered 2017-03-14 (×2): 1 via ORAL
  Administered 2017-03-15 – 2017-03-16 (×3): 2 via ORAL
  Administered 2017-03-17: 1 via ORAL
  Administered 2017-03-18 – 2017-03-20 (×6): 2 via ORAL
  Filled 2017-03-13: qty 2
  Filled 2017-03-13: qty 1
  Filled 2017-03-13 (×2): qty 2
  Filled 2017-03-13 (×2): qty 1
  Filled 2017-03-13: qty 2
  Filled 2017-03-13: qty 1
  Filled 2017-03-13 (×7): qty 2

## 2017-03-13 MED ORDER — INSULIN ASPART 100 UNIT/ML ~~LOC~~ SOLN: 0.0000 [IU] | Freq: Every day | SUBCUTANEOUS | Status: DC

## 2017-03-13 MED ORDER — ACETAMINOPHEN 325 MG PO TABS
650.0000 mg | ORAL_TABLET | Freq: Four times a day (QID) | ORAL | Status: DC | PRN
Start: 2017-03-13 — End: 2017-03-21
  Administered 2017-03-13 – 2017-03-15 (×3): 650 mg via ORAL
  Filled 2017-03-13 (×4): qty 2

## 2017-03-13 MED ORDER — INSULIN ASPART 100 UNIT/ML ~~LOC~~ SOLN: 0.0000 [IU] | Freq: Three times a day (TID) | SUBCUTANEOUS | Status: DC

## 2017-03-13 MED ORDER — ALBUTEROL SULFATE (2.5 MG/3ML) 0.083% IN NEBU
2.5000 mg | INHALATION_SOLUTION | Freq: Three times a day (TID) | RESPIRATORY_TRACT | Status: DC
  Administered 2017-03-13: 2.5 mg via RESPIRATORY_TRACT
  Filled 2017-03-13: qty 3

## 2017-03-13 MED ORDER — ALBUTEROL SULFATE (2.5 MG/3ML) 0.083% IN NEBU: 2.5000 mg | INHALATION_SOLUTION | RESPIRATORY_TRACT | Status: DC | PRN

## 2017-03-13 MED ORDER — MORPHINE SULFATE (PF) 2 MG/ML IV SOLN: 1.0000 mg | INTRAVENOUS | Status: DC | PRN

## 2017-03-13 MED ORDER — BOOST PLUS PO LIQD
237.0000 mL | Freq: Three times a day (TID) | ORAL | Status: DC
  Administered 2017-03-13 – 2017-03-21 (×14): 237 mL via ORAL
  Filled 2017-03-13 (×25): qty 237

## 2017-03-13 MED ORDER — IPRATROPIUM-ALBUTEROL 0.5-2.5 (3) MG/3ML IN SOLN
3.0000 mL | RESPIRATORY_TRACT | Status: DC | PRN
  Administered 2017-03-14 – 2017-03-17 (×2): 3 mL via RESPIRATORY_TRACT
  Filled 2017-03-13 (×2): qty 3

## 2017-03-13 MED ORDER — GUAIFENESIN-DM 100-10 MG/5ML PO SYRP
5.0000 mL | ORAL_SOLUTION | ORAL | Status: DC | PRN
  Administered 2017-03-13 – 2017-03-14 (×3): 5 mL via ORAL
  Filled 2017-03-13 (×3): qty 10

## 2017-03-13 NOTE — Progress Notes (Signed)
Initial Nutrition Assessment  INTERVENTION:   Provide Boost Plus chocolate BID- Each supplement provides 360kcal and 14g protein.   Encourage PO intake RD to continue to monitor  NUTRITION DIAGNOSIS:   Increased nutrient needs related to  (COPD) as evidenced by estimated needs.  GOAL:   Patient will meet greater than or equal to 90% of their needs  MONITOR:   PO intake, Supplement acceptance, Labs, Weight trends, I & O's  REASON FOR ASSESSMENT:   Consult COPD Protocol  ASSESSMENT:   71 y.o. female with medical history significant of COPD, HTN, ?DM (no meds), cataracts.  Patient was actually supposed to have eye surgery today for cataract in eye but this was canceled.  Patient presents to the ED with several day history of SOB, wheezing, cough.  She has been unable to smoke for past 2 days due to severity of SOB.  Of note patient was admitted to hospital for observation for vertigo last week.  Patient in room with no family at bedside. Pt reports feeling hungry but states her family has told her that she doesn't eat enough. Pt states she will eat a sandwich for lunch and maybe a baked potato and salad for dinner most days. Pt's son has been concerned about her protein intake. Pt with allergies to artificial sweeteners. Pt has been drinking Boost drinks, at least once daily for the past week. States she likes them, RD will order Boost Plus.  Pt ate eggs and a bagel this morning for breakfast.  Per chart review, pt with insignificant amounts of weight loss. Nutrition focused physical exam shows no sign of depletion of muscle mass or body fat.  Medications reviewed.  Labs reviewed: CBGs: 174-202 GFR: 44  Diet Order:  Diet regular Room service appropriate? Yes; Fluid consistency: Thin  Skin:  Reviewed, no issues  Last BM:  6/10  Height:   Ht Readings from Last 1 Encounters:  03/12/17 5\' 3"  (1.6 m)    Weight:   Wt Readings from Last 1 Encounters:  03/12/17 158 lb (71.7  kg)    Ideal Body Weight:  52.3 kg  BMI:  Body mass index is 27.99 kg/m.  Estimated Nutritional Needs:   Kcal:  1700-1900  Protein:  75-85g  Fluid:  1.7L/day  EDUCATION NEEDS:   No education needs identified at this time  Clayton Bibles, MS, RD, LDN Pager: [phone] After Hours Pager: [phone]

## 2017-03-13 NOTE — Evaluation (Signed)
Physical Therapy Evaluation Patient Details Name: Victoria Bird MRN: [id number] DOB: [birth year] Today's Date: 03/13/2017   History of Present Illness  71 yo female admitted with COPD exac. Hx of vertigo, DM, COPD, chronic pain, fibromyalgia.   Clinical Impression  On eval, pt required Min assist for mobility. She walked ~50 feet with a straight cane. LOB x 2 requiring external assist to prevent fall. O2 sats were 94% on 3L O2 at rest and 90% on 3L O2 during ambulation. Dyspnea 3/4. Pt presents with general weakness, decreased activity tolerance, and impaired gait and balance. Recommend ST rehab at SNF at this time. If pt progress well, she may be able to return home with HHPT.     Follow Up Recommendations SNF (if pt is agreeable)    Equipment Recommendations  Rolling walker with 5" wheels    Recommendations for Other Services       Precautions / Restrictions Precautions Precautions: Fall Precaution Comments: monitor O2 sats Restrictions Weight Bearing Restrictions: No      Mobility  Bed Mobility Overal bed mobility: Needs Assistance Bed Mobility: Supine to Sit;Sit to Supine     Supine to sit: Supervision Sit to supine: Supervision   General bed mobility comments: for safety, lines  Transfers Overall transfer level: Needs assistance Equipment used: Straight cane Transfers: Sit to/from Stand Sit to Stand: Min assist         General transfer comment: Assist to rise, stabilize, control descent.   Ambulation/Gait Ambulation/Gait assistance: Min assist Ambulation Distance (Feet): 50 Feet Assistive device: Straight cane Gait Pattern/deviations: Step-through pattern;Decreased stride length     General Gait Details: Assist to stabilize throughout ambulation distance. LOB x2 with cane use. Offered RW but pt declined. Dyspnea 3/4. O2 sats 90% on 3L. Pt fatigues easily/quickly.   Stairs            Wheelchair Mobility    Modified Rankin (Stroke Patients  Only)       Balance Overall balance assessment: Needs assistance;History of Falls           Standing balance-Leahy Scale: Poor                               Pertinent Vitals/Pain Pain Assessment: Faces Faces Pain Scale: Hurts little more Pain Location: pt c/o generalized "hurting" Pain Descriptors / Indicators: Aching;Sore Pain Intervention(s): Limited activity within patient's tolerance;Repositioned    Home Living Family/patient expects to be discharged to:: Private residence Living Arrangements: Children Available Help at Discharge: Available PRN/intermittently Type of Home: Mobile home Home Access: Stairs to enter Entrance Stairs-Rails: Right Entrance Stairs-Number of Steps: 6 Home Layout: One level Home Equipment: Cane - single point;Walker - 4 wheels Additional Comments: uses cane mostly because walker is too heavy for her to carry up and down entry steps    Prior Function Level of Independence: Independent with assistive device(s)         Comments: uses cane in home. sometimes uses walker.      Hand Dominance        Extremity/Trunk Assessment   Upper Extremity Assessment Upper Extremity Assessment: Generalized weakness    Lower Extremity Assessment Lower Extremity Assessment: Generalized weakness    Cervical / Trunk Assessment Cervical / Trunk Assessment: Kyphotic  Communication   Communication: No difficulties  Cognition Arousal/Alertness: Awake/alert Behavior During Therapy: WFL for tasks assessed/performed Overall Cognitive Status: Within Functional Limits for tasks assessed  General Comments      Exercises     Assessment/Plan    PT Assessment Patient needs continued PT services  PT Problem List Decreased strength;Decreased mobility;Decreased activity tolerance;Decreased balance;Decreased knowledge of use of DME;Pain       PT Treatment Interventions DME  instruction;Gait training;Therapeutic activities;Therapeutic exercise;Patient/family education;Balance training;Functional mobility training    PT Goals (Current goals can be found in the Care Plan section)  Acute Rehab PT Goals Patient Stated Goal: to get better PT Goal Formulation: With patient Time For Goal Achievement: 03/27/17 Potential to Achieve Goals: Good    Frequency Min 3X/week   Barriers to discharge        Co-evaluation               AM-PAC PT "6 Clicks" Daily Activity  Outcome Measure Difficulty turning over in bed (including adjusting bedclothes, sheets and blankets)?: None Difficulty moving from lying on back to sitting on the side of the bed? : None Difficulty sitting down on and standing up from a chair with arms (e.g., wheelchair, bedside commode, etc,.)?: A Little Help needed moving to and from a bed to chair (including a wheelchair)?: A Little Help needed walking in hospital room?: A Little Help needed climbing 3-5 steps with a railing? : A Little 6 Click Score: 20    End of Session Equipment Utilized During Treatment: Gait belt;Oxygen Activity Tolerance: Patient limited by fatigue Patient left: in bed;with call bell/phone within reach;with bed alarm set   PT Visit Diagnosis: Muscle weakness (generalized) (M62.81);Difficulty in walking, not elsewhere classified (R26.2)    Time: 6808-8110 PT Time Calculation (min) (ACUTE ONLY): 12 min   Charges:   PT Evaluation $PT Eval Low Complexity: 1 Procedure     PT G Codes:          Weston Anna, MPT Pager: [phone]

## 2017-03-13 NOTE — Progress Notes (Signed)
CSW met with patient at bedside, explain role and reason for visit- to discuss discharge plan. Patient pleasant and oriented x4. Patient reports, "I would like to go home but I was told that I could go to rehab." CSW explained PT recommendations and educated patient about rehab facilities.  Patient reports, "I need time to think about it." CSW follow up with patient at later time.  Kathrin Greathouse, Latanya Presser, MSW Clinical Social Worker 5E and Psychiatric Service Line [phone] 03/13/2017  12:14 PM

## 2017-03-13 NOTE — Progress Notes (Signed)
Occupational Therapy Evaluation Patient Details Name: Victoria Bird MRN: [id number] DOB: April 24, 1946 Today's Date: 03/13/2017    History of Present Illness 71 yo female admitted with COPD exac. Hx of vertigo, DM, COPD, chronic pain, fibromyalgia.    Clinical Impression   PTA, pt lived at home and was modified independent with ADL and mobility @ straight cane level. Pt's grandson, girlfriend and 2 children live with them. Pt discussed family dynamics which are "stressing her out". Pt easily fatigues with minimal activity. Desats to 85 (HR 119) on RA during bathroom mobility/self care. Feel pt will benefit from rehab at SNF due to deficits listed below in order to facilitate safe DC home. Will follow acutely to address established goals and facilitate DC to next venue of care.     Follow Up Recommendations  SNF    Equipment Recommendations  3 in 1 bedside commode    Recommendations for Other Services       Precautions / Restrictions Precautions Precautions: Fall Precaution Comments: monitor O2 sats Restrictions Weight Bearing Restrictions: No      Mobility Bed Mobility Overal bed mobility: Modified Independent                Transfers Overall transfer level: Needs assistance Equipment used: Straight cane Transfers: Sit to/from Stand Sit to Stand: Min guard              Balance Overall balance assessment: Needs assistance;History of Falls   Sitting balance-Leahy Scale: Good       Standing balance-Leahy Scale: Fair                             ADL either performed or assessed with clinical judgement   ADL Overall ADL's : Needs assistance/impaired     Grooming: Set up;Standing   Upper Body Bathing: Set up;Supervision/ safety;Sitting   Lower Body Bathing: Min guard;Sit to/from stand   Upper Body Dressing : Set up;Sitting   Lower Body Dressing: Min guard;Sit to/from stand   Toilet Transfer: Min guard;Comfort height  toilet;Ambulation   Toileting- Clothing Manipulation and Hygiene: Supervision/safety;Sit to/from stand       Functional mobility during ADLs: Min guard General ADL Comments: Easily fatigues with mobility to toilet adn hygiene after toileting. Desats to 85 on RA     Vision         Perception     Praxis      Pertinent Vitals/Pain Pain Assessment: Faces Faces Pain Scale: Hurts little more Pain Location: back, ribs Pain Descriptors / Indicators: Aching;Sore;Grimacing Pain Intervention(s): Limited activity within patient's tolerance     Hand Dominance Right   Extremity/Trunk Assessment Upper Extremity Assessment Upper Extremity Assessment: Generalized weakness   Lower Extremity Assessment Lower Extremity Assessment: Generalized weakness   Cervical / Trunk Assessment Cervical / Trunk Assessment: Kyphotic   Communication Communication Communication: No difficulties   Cognition Arousal/Alertness: Awake/alert Behavior During Therapy: WFL for tasks assessed/performed Overall Cognitive Status: Within Functional Limits for tasks assessed                                     General Comments       Exercises     Shoulder Instructions      Home Living Family/patient expects to be discharged to:: Private residence Living Arrangements: Children Available Help at Discharge: Available PRN/intermittently Type of Home: Mobile home Home Access:  Stairs to enter [employer] of Steps: 6 Entrance Stairs-Rails: Right Home Layout: One level     Bathroom Shower/Tub: Teacher, early years/pre: Standard Bathroom Accessibility: Yes How Accessible: Accessible via walker Home Equipment: [place] - single point;Walker - 4 wheels   Additional Comments: uses cane mostly because walker is too heavy for her to carry up and down entry steps      Prior Functioning/Environment Level of Independence: Independent with assistive device(s)         Comments: uses cane in home. sometimes uses walker.         OT Problem List: Decreased strength;Decreased activity tolerance;Decreased knowledge of use of DME or AE;Cardiopulmonary status limiting activity;Pain      OT Treatment/Interventions: Self-care/ADL training;Therapeutic exercise;Energy conservation;DME and/or AE instruction;Therapeutic activities;Patient/family education;Balance training    OT Goals(Current goals can be found in the care plan section) Acute Rehab OT Goals Patient Stated Goal: to get better OT Goal Formulation: With patient Time For Goal Achievement: 03/27/17 Potential to Achieve Goals: Good  OT Frequency: Min 2X/week   Barriers to D/C: Decreased caregiver support          Co-evaluation              AM-PAC PT "6 Clicks" Daily Activity     Outcome Measure Help from another person eating meals?: None Help from another person taking care of personal grooming?: A Little Help from another person toileting, which includes using toliet, bedpan, or urinal?: A Little Help from another person bathing (including washing, rinsing, drying)?: A Little Help from another person to put on and taking off regular upper body clothing?: A Little Help from another person to put on and taking off regular lower body clothing?: A Little 6 Click Score: 19   End of Session Nurse Communication: Mobility status  Activity Tolerance: Patient limited by fatigue Patient left: in bed;with call bell/phone within reach;with bed alarm set  OT Visit Diagnosis: Unsteadiness on feet (R26.81);Muscle weakness (generalized) (M62.81);Pain Pain - part of body:  (back/ribs)                Time: 7017-7939 OT Time Calculation (min): 22 min Charges:  OT General Charges $OT Visit: 1 Procedure OT Evaluation $OT Eval Moderate Complexity: 1 Procedure G-Codes:     Keoni Risinger, OT/L  [phone] 03/13/2017  Jona Zappone,HILLARY 03/13/2017, 4:07 PM

## 2017-03-13 NOTE — Progress Notes (Signed)
Inpatient Diabetes Program Recommendations  AACE/ADA: New Consensus Statement on Inpatient Glycemic Control (2015)  Target Ranges:  Prepandial:   less than 140 mg/dL      Peak postprandial:   less than 180 mg/dL (1-2 hours)      Critically ill patients:  140 - 180 mg/dL   Review of Glycemic Control  Diabetes history: DM 2 Outpatient Diabetes medications: Metformin 500 mg BID Current orders for Inpatient glycemic control: None  Inpatient Diabetes Program Recommendations:    Patient on Steroids, IV Solumedrol 60 mg Q12 hours. Elevated glucose trends. Consider Novolog Sensitive Correction tid + Novolog HS scale while inpatient and on current steroid dose.  A1c 5.7 % 11/2016  Thanks,  Tama Headings RN, MSN, [facility] Inpatient Diabetes Coordinator Team Pager [phone] (8a-5p)

## 2017-03-13 NOTE — Progress Notes (Signed)
Resumed care of pt. Agree with previous RN's assessment. Will continue to monitor and follow plan of care.  

## 2017-03-13 NOTE — Progress Notes (Signed)
PROGRESS NOTE    Victoria Bird  HQI:[id number] DOB: July 17, 1946 DOA: 03/12/2017 PCP: Hoyt Koch, MD   Brief Narrative: Victoria Bird is a 71 y.o. female with medical history significant of COPD, HTN, ?DM (no meds), cataracts.  pt was scheduled to have cataract surgery on 6/11,but it was cancelled. She presented to ED FOR sob and was admitted for copd exacerbation.    Assessment & Plan:   Principal Problem:   COPD with acute exacerbation ([place]) Active Problems:   Hypertension   Acute respiratory failure with hypoxia ([name])   Diabetes mellitus type 2, controlled ([place])   Acute respiratory failure with hypoxia secondary to Acute COPD exacerbation:  Admitted for IV steroids, Levaquin, duonebs and and [place] oxygent to keep sats greater than 90%.  She is on 3 lit of [place] oxygen with sats in upper 90's  Plan to wean her off the oxygen int he next 24 hours.  Added robitussin to help with the cough.  IV pain control for the back pain.     Diabetes mellitus:  CBG (last 3)   Recent Labs  03/12/17 2224 03/13/17 1038  GLUCAP 255* 174*   Plan was to start her SSI, pt refuses to be on SSI.   Palmer erythema  unclear etiology. Probably from copd vs hematochromatosis.  No burning or itching. Hemoglobin of 15.2 slightly than normal.  Baseline hemoglobin higher in the past went as high as 16. Will check iron studies.  Monitor.   Hypertension: well controlled.   Stage 1 CKD:  CREATININE at baseline aroud 1.26, . Same today at 1.23     DVT prophylaxis: lovenox.  Code Status: (full code.  Family Communication: none at bedside.  Disposition Plan: pending further treatment with IV steroids and duo nebs and PT evaluation.    Consultants:   None.    Procedures: none.    Antimicrobials levaquin 750 mg daily.    Subjective: Breathing and coughing has not improved much from yesterday.  Objective: Vitals:   03/13/17 0002 03/13/17 0431 03/13/17 0740 03/13/17 0743    BP:  (!) 142/81 (!) 144/63   Pulse:  88 88 98  Resp:  20  (!) 24  Temp:  97.8 F (36.6 C) 98.7 F (37.1 C)   TempSrc:  Oral Oral   SpO2: 93% 93% 91% 90%  Weight:      Height:        Intake/Output Summary (Last 24 hours) at 03/13/17 1145 Last data filed at 03/13/17 1142  Gross per 24 hour  Intake              360 ml  Output                0 ml  Net              360 ml   Filed Weights   03/12/17 1431  Weight: 71.7 kg (158 lb)    Examination:  General exam: Appears anxious, reports feeling sleep deprived.  Respiratory system: Clear to auscultation. Respiratory effort normal. Cardiovascular system: S1 & S2 heard, RRR. No JVD, murmurs, rubs, gallops or clicks. No pedal edema. Gastrointestinal system: Abdomen is nondistended, soft and nontender. No organomegaly or masses felt. Normal bowel sounds heard. Central nervous system: Alert and oriented. No focal neurological deficits. Extremities: Symmetric 5 x 5 power. Skin: No rashes, lesions or ulcers Psychiatry: Judgement and insight appear normal. Mood & affect appropriate.     Data Reviewed: I have personally  reviewed following labs and imaging studies  CBC:  Recent Labs Lab 03/12/17 1548  WBC 8.8  NEUTROABS 5.2  HGB 15.2*  HCT 45.4  MCV 87.0  PLT 818   Basic Metabolic Panel:  Recent Labs Lab 03/12/17 1548  NA 138  K 4.0  CL 106  CO2 27  GLUCOSE 93  BUN 20  CREATININE 1.23*  CALCIUM 8.9   GFR: Estimated Creatinine Clearance: 41 mL/min (A) (by C-G formula based on SCr of 1.23 mg/dL (H)). Liver Function Tests:  Recent Labs Lab 03/12/17 1548  AST 20  ALT 20  ALKPHOS 73  BILITOT 0.9  PROT 6.9  ALBUMIN 3.7   No results for input(s): LIPASE, AMYLASE in the last 168 hours. No results for input(s): AMMONIA in the last 168 hours. Coagulation Profile: No results for input(s): INR, PROTIME in the last 168 hours. Cardiac Enzymes: No results for input(s): CKTOTAL, CKMB, CKMBINDEX, TROPONINI in the  last 168 hours. BNP (last 3 results) No results for input(s): PROBNP in the last 8760 hours. HbA1C: No results for input(s): HGBA1C in the last 72 hours. CBG:  Recent Labs Lab 03/12/17 2224 03/13/17 1038  GLUCAP 255* 174*   Lipid Profile: No results for input(s): CHOL, HDL, LDLCALC, TRIG, CHOLHDL, LDLDIRECT in the last 72 hours. Thyroid Function Tests: No results for input(s): TSH, T4TOTAL, FREET4, T3FREE, THYROIDAB in the last 72 hours. Anemia Panel: No results for input(s): VITAMINB12, FOLATE, FERRITIN, TIBC, IRON, RETICCTPCT in the last 72 hours. Sepsis Labs: No results for input(s): PROCALCITON, LATICACIDVEN in the last 168 hours.  No results found for this or any previous visit (from the past 240 hour(s)).       Radiology Studies: Dg Chest Port 1 View  Result Date: 03/12/2017 CLINICAL DATA:  Shortness of breath and nonproductive cough. EXAM: PORTABLE CHEST 1 VIEW COMPARISON:  12/15/2016 FINDINGS: 1531 hours. Lungs are hyperexpanded right hilar fullness similar to prior studies back to 04/09/2013. Interstitial markings are diffusely coarsened with chronic features. The lungs are clear without focal pneumonia, edema, pneumothorax or pleural effusion. The visualized bony structures of the thorax are intact. Telemetry leads overlie the chest. IMPRESSION: Chronic interstitial coarsening.  No acute cardiopulmonary findings. Electronically Signed   By: Misty Stanley M.D.   On: 03/12/2017 16:47        Scheduled Meds: . albuterol  2.5 mg Nebulization TID  . enoxaparin (LOVENOX) injection  40 mg Subcutaneous QHS  . losartan  100 mg Oral Daily   And  . hydrochlorothiazide  12.5 mg Oral Daily  . insulin aspart  0-5 Units Subcutaneous QHS  . insulin aspart  0-9 Units Subcutaneous TID WC  . levofloxacin  750 mg Oral Q48H  . methylPREDNISolone (SOLU-MEDROL) injection  60 mg Intravenous Q12H  . predniSONE  60 mg Oral Once  . tiotropium  18 mcg Inhalation Daily   Continuous  Infusions:   LOS: 1 day    Time spent: 35  Minutes.     Hosie Poisson, MD [facility] Pager [phone]  If 7PM-7AM, please contact night-coverage [email or web address] Password [name] 03/13/2017, 11:45 AM

## 2017-03-14 DIAGNOSIS — J9601 Acute respiratory failure with hypoxia: Secondary | ICD-10-CM

## 2017-03-14 DIAGNOSIS — E1121 Type 2 diabetes mellitus with diabetic nephropathy: Secondary | ICD-10-CM

## 2017-03-14 DIAGNOSIS — I10 Essential (primary) hypertension: Secondary | ICD-10-CM

## 2017-03-14 DIAGNOSIS — N183 Chronic kidney disease, stage 3 (moderate): Secondary | ICD-10-CM

## 2017-03-14 DIAGNOSIS — J441 Chronic obstructive pulmonary disease with (acute) exacerbation: Principal | ICD-10-CM

## 2017-03-14 DIAGNOSIS — Z720 Tobacco use: Secondary | ICD-10-CM

## 2017-03-14 DIAGNOSIS — D72829 Elevated white blood cell count, unspecified: Secondary | ICD-10-CM

## 2017-03-14 DIAGNOSIS — R5381 Other malaise: Secondary | ICD-10-CM

## 2017-03-14 LAB — BASIC METABOLIC PANEL
Anion gap: 9 (ref 5–15)
BUN: 42 mg/dL — ABNORMAL HIGH (ref 6–20)
CALCIUM: 9.2 mg/dL (ref 8.9–10.3)
CHLORIDE: 102 mmol/L (ref 101–111)
CO2: 27 mmol/L (ref 22–32)
CREATININE: 1.44 mg/dL — AB (ref 0.44–1.00)
GFR calc Af Amer: 42 mL/min — ABNORMAL LOW (ref >60)
GFR calc non Af Amer: 36 mL/min — ABNORMAL LOW (ref >60)
GLUCOSE: 171 mg/dL — AB (ref 65–99)
Potassium: 4.6 mmol/L (ref 3.5–5.1)
Sodium: 138 mmol/L (ref 135–145)

## 2017-03-14 LAB — GLUCOSE, CAPILLARY
GLUCOSE-CAPILLARY: 125 mg/dL — AB (ref 65–99)
Glucose-Capillary: 131 mg/dL — ABNORMAL HIGH (ref 65–99)
Glucose-Capillary: 186 mg/dL — ABNORMAL HIGH (ref 65–99)
Glucose-Capillary: 123 mg/dL — ABNORMAL HIGH (ref 65–99)

## 2017-03-14 LAB — CBC
HCT: 44.5 % (ref 36.0–46.0)
HEMOGLOBIN: 14.7 g/dL (ref 12.0–15.0)
MCH: 28.5 pg (ref 26.0–34.0)
MCHC: 33 g/dL (ref 30.0–36.0)
MCV: 86.2 fL (ref 78.0–100.0)
PLATELETS: 244 10*3/uL (ref 150–400)
RBC: 5.16 MIL/uL — ABNORMAL HIGH (ref 3.87–5.11)
RDW: 14.3 % (ref 11.5–15.5)
WBC: 13.2 10*3/uL — ABNORMAL HIGH (ref 4.0–10.5)

## 2017-03-14 MED ORDER — METHYLPREDNISOLONE SODIUM SUCC 125 MG IJ SOLR
60.0000 mg | Freq: Every day | INTRAMUSCULAR | Status: DC
  Administered 2017-03-15 – 2017-03-16 (×2): 60 mg via INTRAVENOUS
  Filled 2017-03-14 (×2): qty 2

## 2017-03-14 MED ORDER — METHYLPREDNISOLONE SODIUM SUCC 125 MG IJ SOLR
INTRAMUSCULAR | Status: AC
  Filled 2017-03-14: qty 2

## 2017-03-14 MED ORDER — BENZONATATE 100 MG PO CAPS
200.0000 mg | ORAL_CAPSULE | Freq: Three times a day (TID) | ORAL | Status: DC
  Administered 2017-03-14 – 2017-03-21 (×22): 200 mg via ORAL
  Filled 2017-03-14 (×22): qty 2

## 2017-03-14 MED ORDER — HYDRALAZINE HCL 20 MG/ML IJ SOLN
10.0000 mg | Freq: Four times a day (QID) | INTRAMUSCULAR | Status: DC | PRN
  Administered 2017-03-16 – 2017-03-17 (×2): 10 mg via INTRAVENOUS
  Filled 2017-03-14 (×3): qty 0.5

## 2017-03-14 NOTE — Progress Notes (Addendum)
PROGRESS NOTE    Victoria Bird  [name]:[id number] DOB: 1946-09-07 DOA: 03/12/2017 PCP: Hoyt Koch, MD   Chief Complaint  Patient presents with  . Shortness of Breath  . Cough    Brief Narrative:  HPI on 03/12/2017 by Dr. Jennette Kettle SHENIQUA CAROLAN is a 71 y.o. female with medical history significant of COPD, HTN, ?DM (no meds), cataracts.  Patient was actually supposed to have eye surgery today for cataract in eye but this was canceled.  Patient presents to the ED with several day history of SOB, wheezing, cough.  She has been unable to smoke for past 2 days due to severity of SOB.  Of note patient was admitted to hospital for observation for vertigo last week.  Assessment & Plan   Acute respiratory failure with hypoxia/ Acute COPD exacerbation -Continue levaquin (renally adjusted), supplemental oxygen to maintain O 2 sats > 90%, antitussives -Chest x-ray showed chronic interstitial coarsening, no acute cardiopulmonary findings -Currently on IV solumedrol, will wean solumedrol -Refuses prednisone. She was told she is losing her vision secondary to chronic prednisone use. -Continue Spiriva -Discussed with Dr. Ashok Cordia, pulmonology, suggested pulmicort 0.5 neb BID, possibly brovana -will ask for RN to monitor patient's O2 sats walking and at rest with and without O2  Diabetes mellitus, type II -Patient refuses to be on insulin sliding scale -on metformin at home  Palmar erythema -Unknown etiology -Possibly secondary to hemochromatosis (as Hb was 15.2) versus COPD -Currently no burning or itching -Anemia panel, reviewed and within normal limits  Essential hypertension -Patient takes losartan/HCTZ at home -continue losartan -Will add on IV hydralazine as patient's BP is uncontrolled  Chronic kidney stage III -Creatinine appears to be at baseline, continue to monitor BMP  Leukocytosis -Likely secondary to steroids, will begin to wean Solu-Medrol and monitor  CBC  Tobacco abuse -Discussed smoking cessation  Deconditioning -Likely secondary to patient's comorbid conditions -PT and OT consulted, recommended SNF, rolling walker, 3in1 bedside commode  DVT Prophylaxis  Lovenox  Code Status: Full  Family Communication: none at bedside  Disposition Plan: Admitted. Continue to wean and treat COPD.   Consultants Pulmonology, Dr. Ashok Cordia, via phone  Procedures  None  Antibiotics   Anti-infectives    Start     Dose/Rate Route Frequency Ordered Stop   03/12/17 2200  levofloxacin (LEVAQUIN) tablet 750 mg     750 mg Oral Every 48 hours 03/12/17 2126     03/12/17 2115  levofloxacin (LEVAQUIN) tablet 750 mg  Status:  Discontinued     750 mg Oral Daily 03/12/17 2108 03/12/17 2125      Subjective:   Victoria Bird seen and examined today.  Continues to feel short of breath, not back to her baseline. Patient adamant about not wanting to be on prednisone even that it has caused her to lose her eyesight. Denies current chest pain, abdominal pain, nausea or vomiting, diarrhea or constipation, dizziness or headache. Patient does not wish to go home on oxygen.  Objective:   Vitals:   03/13/17 2112 03/14/17 0505 03/14/17 0934 03/14/17 1206  BP: (!) 140/58 (!) 176/74    Pulse: (!) 56 (!) 51    Resp: 20 20    Temp: 97.7 F (36.5 C) 98.2 F (36.8 C)    TempSrc: Oral Oral    SpO2: 96% 97% 92% 92%  Weight:      Height:        Intake/Output Summary (Last 24 hours) at 03/14/17 1309 Last data filed  at 03/14/17 0947  Gross per 24 hour  Intake              600 ml  Output                0 ml  Net              600 ml   Filed Weights   03/12/17 1431  Weight: 71.7 kg (158 lb)    Exam  General: Well developed, elderly, chronically ill appearing  HEENT: NCAT, mucous membranes moist.   Cardiovascular: S1 S2 auscultated, no rubs, murmurs or gallops. Regular rate and rhythm.  Respiratory: Diminished breath sounds, exp wheezing, dry  cough  Abdomen: Soft, nontender, nondistended, + bowel sounds  Extremities: warm dry without cyanosis clubbing or edema  Neuro: AAOx3,nonfocal  Psych: Anxious, however appropriate   Data Reviewed: I have personally reviewed following labs and imaging studies  CBC:  Recent Labs Lab 03/12/17 1548 03/14/17 0821  WBC 8.8 13.2*  NEUTROABS 5.2  --   HGB 15.2* 14.7  HCT 45.4 44.5  MCV 87.0 86.2  PLT 199 157   Basic Metabolic Panel:  Recent Labs Lab 03/12/17 1548 03/14/17 0821  NA 138 138  K 4.0 4.6  CL 106 102  CO2 27 27  GLUCOSE 93 171*  BUN 20 42*  CREATININE 1.23* 1.44*  CALCIUM 8.9 9.2   GFR: Estimated Creatinine Clearance: 35 mL/min (A) (by C-G formula based on SCr of 1.44 mg/dL (H)). Liver Function Tests:  Recent Labs Lab 03/12/17 1548  AST 20  ALT 20  ALKPHOS 73  BILITOT 0.9  PROT 6.9  ALBUMIN 3.7   No results for input(s): LIPASE, AMYLASE in the last 168 hours. No results for input(s): AMMONIA in the last 168 hours. Coagulation Profile: No results for input(s): INR, PROTIME in the last 168 hours. Cardiac Enzymes: No results for input(s): CKTOTAL, CKMB, CKMBINDEX, TROPONINI in the last 168 hours. BNP (last 3 results) No results for input(s): PROBNP in the last 8760 hours. HbA1C: No results for input(s): HGBA1C in the last 72 hours. CBG:  Recent Labs Lab 03/13/17 1213 03/13/17 1644 03/13/17 2139 03/14/17 0746 03/14/17 1209  GLUCAP 202* 160* 194* 125* 131*   Lipid Profile: No results for input(s): CHOL, HDL, LDLCALC, TRIG, CHOLHDL, LDLDIRECT in the last 72 hours. Thyroid Function Tests: No results for input(s): TSH, T4TOTAL, FREET4, T3FREE, THYROIDAB in the last 72 hours. Anemia Panel:  Recent Labs  03/13/17 1411  FERRITIN 92  TIBC 316  IRON 58   Urine analysis:    Component Value Date/Time   COLORURINE YELLOW 03/02/2017 2245   APPEARANCEUR HAZY (A) 03/02/2017 2245   LABSPEC 1.008 03/02/2017 2245   PHURINE 5.0 03/02/2017  2245   GLUCOSEU NEGATIVE 03/02/2017 2245   HGBUR NEGATIVE 03/02/2017 2245   BILIRUBINUR NEGATIVE 03/02/2017 2245   KETONESUR NEGATIVE 03/02/2017 2245   PROTEINUR NEGATIVE 03/02/2017 2245   UROBILINOGEN 1.0 06/16/2015 1545   NITRITE NEGATIVE 03/02/2017 2245   LEUKOCYTESUR NEGATIVE 03/02/2017 2245   Sepsis Labs: @LABRCNTIP (procalcitonin:4,lacticidven:4)  )No results found for this or any previous visit (from the past 240 hour(s)).    Radiology Studies: Dg Chest Port 1 View  Result Date: 03/12/2017 CLINICAL DATA:  Shortness of breath and nonproductive cough. EXAM: PORTABLE CHEST 1 VIEW COMPARISON:  12/15/2016 FINDINGS: 1531 hours. Lungs are hyperexpanded right hilar fullness similar to prior studies back to 04/09/2013. Interstitial markings are diffusely coarsened with chronic features. The lungs are clear without focal pneumonia,  edema, pneumothorax or pleural effusion. The visualized bony structures of the thorax are intact. Telemetry leads overlie the chest. IMPRESSION: Chronic interstitial coarsening.  No acute cardiopulmonary findings. Electronically Signed   By: Misty Stanley M.D.   On: 03/12/2017 16:47     Scheduled Meds: . benzonatate  200 mg Oral TID  . enoxaparin (LOVENOX) injection  40 mg Subcutaneous QHS  . lactose free nutrition  237 mL Oral TID WC  . levofloxacin  750 mg Oral Q48H  . losartan  100 mg Oral Daily  . [START ON 03/15/2017] methylPREDNISolone (SOLU-MEDROL) injection  60 mg Intravenous Daily  . tiotropium  18 mcg Inhalation Daily   Continuous Infusions:   LOS: 2 days   Time Spent in minutes   35 minutes  Melissa Pulido D.O. on 03/14/2017 at 1:09 PM  Between 7am to 7pm - Pager - [phone]  After 7pm go to [email or web address] - password [email or web address]  And look for the night coverage person covering for me after hours  [facility] Office  [phone]

## 2017-03-14 NOTE — Consult Note (Signed)
   [name] Inpatient Consult   03/14/2017  Victoria Bird [birth year] [id number]   Patient was screened for re-admission as a [employer] [place] beneficiary.  Chart review reveals the patient's current discharge plan is for a skilled nursing facility as recommended by PT and OT evaluations and no [place] Management needs noted at this time.  Patient admitted with COPD with shortness of breath. Notes that patient states she would need to think about going to a skilled facility for rehab.  This is the patient's 3rd admission in the past 6 month period.  For questions, please contact:  Natividad Brood, RN BSN [facility] Liaison  [phone] business mobile phone Toll free office [phone]

## 2017-03-15 DIAGNOSIS — L538 Other specified erythematous conditions: Secondary | ICD-10-CM

## 2017-03-15 LAB — GLUCOSE, CAPILLARY
GLUCOSE-CAPILLARY: 149 mg/dL — AB (ref 65–99)
GLUCOSE-CAPILLARY: 213 mg/dL — AB (ref 65–99)
Glucose-Capillary: 84 mg/dL (ref 65–99)
Glucose-Capillary: 101 mg/dL — ABNORMAL HIGH (ref 65–99)

## 2017-03-15 LAB — CBC
HEMATOCRIT: 46.1 % — AB (ref 36.0–46.0)
HEMOGLOBIN: 14.7 g/dL (ref 12.0–15.0)
MCH: 28.5 pg (ref 26.0–34.0)
MCHC: 31.9 g/dL (ref 30.0–36.0)
MCV: 89.3 fL (ref 78.0–100.0)
PLATELETS: 221 10*3/uL (ref 150–400)
RBC: 5.16 MIL/uL — AB (ref 3.87–5.11)
RDW: 14.6 % (ref 11.5–15.5)
WBC: 10.2 10*3/uL (ref 4.0–10.5)

## 2017-03-15 LAB — BASIC METABOLIC PANEL
ANION GAP: 8 (ref 5–15)
BUN: 44 mg/dL — ABNORMAL HIGH (ref 6–20)
CO2: 31 mmol/L (ref 22–32)
Calcium: 8.9 mg/dL (ref 8.9–10.3)
Chloride: 102 mmol/L (ref 101–111)
Creatinine, Ser: 1.48 mg/dL — ABNORMAL HIGH (ref 0.44–1.00)
GFR calc Af Amer: 41 mL/min — ABNORMAL LOW (ref >60)
GFR, EST NON AFRICAN AMERICAN: 35 mL/min — AB (ref >60)
GLUCOSE: 99 mg/dL (ref 65–99)
POTASSIUM: 4.1 mmol/L (ref 3.5–5.1)
Sodium: 141 mmol/L (ref 135–145)

## 2017-03-15 MED ORDER — HYDROCODONE-HOMATROPINE 5-1.5 MG/5ML PO SYRP
5.0000 mL | ORAL_SOLUTION | ORAL | Status: DC | PRN
  Administered 2017-03-17 – 2017-03-19 (×4): 5 mL via ORAL
  Filled 2017-03-15 (×4): qty 5

## 2017-03-15 MED ORDER — ONDANSETRON HCL 4 MG/2ML IJ SOLN
INTRAMUSCULAR | Status: AC
  Filled 2017-03-15: qty 2

## 2017-03-15 MED ORDER — ONDANSETRON HCL 4 MG/2ML IJ SOLN
4.0000 mg | Freq: Four times a day (QID) | INTRAMUSCULAR | Status: DC | PRN
  Administered 2017-03-15 – 2017-03-21 (×7): 4 mg via INTRAVENOUS
  Filled 2017-03-15 (×6): qty 2

## 2017-03-15 MED ORDER — CAMPHOR-MENTHOL 0.5-0.5 % EX LOTN
TOPICAL_LOTION | CUTANEOUS | Status: DC | PRN
  Administered 2017-03-15: 1 via TOPICAL
  Filled 2017-03-15: qty 222

## 2017-03-15 NOTE — Progress Notes (Addendum)
PROGRESS NOTE    Victoria Bird  KDX:[id number] DOB: 01-Jun-1946 DOA: 03/12/2017 PCP: Hoyt Koch, MD   Chief Complaint  Patient presents with  . Shortness of Breath  . Cough    Brief Narrative:  HPI on 03/12/2017 by Dr. Jennette Kettle Victoria Bird is a 71 y.o. female with medical history significant of COPD, HTN, ?DM (no meds), cataracts.  Patient was actually supposed to have eye surgery today for cataract in eye but this was canceled.  Patient presents to the ED with several day history of SOB, wheezing, cough.  She has been unable to smoke for past 2 days due to severity of SOB.  Of note patient was admitted to hospital for observation for vertigo last week.  Assessment & Plan   Acute respiratory failure with hypoxia/ Acute COPD exacerbation -Continue levaquin (renally adjusted), supplemental oxygen to maintain O 2 sats > 90%, antitussives -Chest x-ray showed chronic interstitial coarsening, no acute cardiopulmonary findings -Currently on IV solumedrol, continue to wean solumedrol as possible -Refuses prednisone. She was told she is losing her vision secondary to chronic prednisone use. -Continue Spiriva -on 03/14/17, Discussed with Dr. Ashok Cordia, pulmonology, suggested pulmicort 0.5 neb BID, possibly brovana -will ask for RN to monitor patient's O2 sats walking and at rest with and without O2 -Feels tessalon has helped her cough, however continues to cough and has not used robitussin.  -Will add on hycodan today to see if this helps her cough   Diabetes mellitus, type II -Patient refuses to be on insulin sliding scale -on metformin at home  Palmar erythema -Unknown etiology -Possibly secondary to hemochromatosis (as Hb was 15.2) versus COPD -Starting to have itching, mild erythema on hands (patient states it was on her arms last night) -Anemia panel, reviewed and within normal limits -Will order sarna cream  Essential hypertension -Patient takes  losartan/HCTZ at home -continue losartan -Continue IV hydralazine PRN  Chronic kidney stage III -Creatinine appears to be at baseline, continue to monitor BMP  Leukocytosis -Likely secondary to steroids -Resolved  Tobacco abuse -Discussed smoking cessation  Deconditioning -Likely secondary to patient's comorbid conditions -PT and OT consulted, recommended SNF, rolling walker, 3in1 bedside commode  DVT Prophylaxis  Lovenox  Code Status: Full  Family Communication: none at bedside  Disposition Plan: Admitted. Continue to wean and treat COPD. Home vs SNF at discharge  Consultants Pulmonology, Dr. Ashok Cordia, via phone  Procedures  None  Antibiotics   Anti-infectives    Start     Dose/Rate Route Frequency Ordered Stop   03/12/17 2200  levofloxacin (LEVAQUIN) tablet 750 mg     750 mg Oral Every 48 hours 03/12/17 2126     03/12/17 2115  levofloxacin (LEVAQUIN) tablet 750 mg  Status:  Discontinued     750 mg Oral Daily 03/12/17 2108 03/12/17 2125      Subjective:   Victoria Bird seen and examined today.  Continues to feel weak and "run down." Continues to have cough. Feels tessalon helps some. Denies chest pain, abdominal pain, nausea, vomiting, diarrhea, constipation, dizziness, headache.  Feels shortness of breath has not improved and not close to her baseline. Still does not want home oxygen.  Objective:   Vitals:   03/15/17 0800 03/15/17 0824 03/15/17 0902 03/15/17 0937  BP: 134/73     Pulse: 76     Resp: 20     Temp:  97.9 F (36.6 C)    TempSrc:  Oral    SpO2: 96%  95% 95%  Weight:      Height:        Intake/Output Summary (Last 24 hours) at 03/15/17 1217 Last data filed at 03/15/17 0825  Gross per 24 hour  Intake              480 ml  Output                0 ml  Net              480 ml   Filed Weights   03/12/17 1431  Weight: 71.7 kg (158 lb)   Exam  General: Well developed, elderly, chronically ill appearing, mild distress  HEENT: NCAT,  mucous membranes moist.   Cardiovascular: S1 S2 auscultated, RRR, no murmurs  Respiratory: Diminished breath sounds, exp wheezing, dry cough  Abdomen: Soft, nontender, nondistended, + bowel sounds  Extremities: warm dry without cyanosis clubbing or edema  Neuro: AAOx3, nonfocal  Skin: Dry, mild erythema on palms  Psych: Appropriate mood and affect  Data Reviewed: I have personally reviewed following labs and imaging studies  CBC:  Recent Labs Lab 03/12/17 1548 03/14/17 0821 03/15/17 0751  WBC 8.8 13.2* 10.2  NEUTROABS 5.2  --   --   HGB 15.2* 14.7 14.7  HCT 45.4 44.5 46.1*  MCV 87.0 86.2 89.3  PLT 199 244 188   Basic Metabolic Panel:  Recent Labs Lab 03/12/17 1548 03/14/17 0821 03/15/17 0751  NA 138 138 141  K 4.0 4.6 4.1  CL 106 102 102  CO2 27 27 31   GLUCOSE 93 171* 99  BUN 20 42* 44*  CREATININE 1.23* 1.44* 1.48*  CALCIUM 8.9 9.2 8.9   GFR: Estimated Creatinine Clearance: 34 mL/min (A) (by C-G formula based on SCr of 1.48 mg/dL (H)). Liver Function Tests:  Recent Labs Lab 03/12/17 1548  AST 20  ALT 20  ALKPHOS 73  BILITOT 0.9  PROT 6.9  ALBUMIN 3.7   No results for input(s): LIPASE, AMYLASE in the last 168 hours. No results for input(s): AMMONIA in the last 168 hours. Coagulation Profile: No results for input(s): INR, PROTIME in the last 168 hours. Cardiac Enzymes: No results for input(s): CKTOTAL, CKMB, CKMBINDEX, TROPONINI in the last 168 hours. BNP (last 3 results) No results for input(s): PROBNP in the last 8760 hours. HbA1C: No results for input(s): HGBA1C in the last 72 hours. CBG:  Recent Labs Lab 03/14/17 1209 03/14/17 1648 03/14/17 2115 03/15/17 0724 03/15/17 1209  GLUCAP 131* 186* 123* 84 101*   Lipid Profile: No results for input(s): CHOL, HDL, LDLCALC, TRIG, CHOLHDL, LDLDIRECT in the last 72 hours. Thyroid Function Tests: No results for input(s): TSH, T4TOTAL, FREET4, T3FREE, THYROIDAB in the last 72 hours. Anemia  Panel:  Recent Labs  03/13/17 1411  FERRITIN 92  TIBC 316  IRON 58   Urine analysis:    Component Value Date/Time   COLORURINE YELLOW 03/02/2017 2245   APPEARANCEUR HAZY (A) 03/02/2017 2245   LABSPEC 1.008 03/02/2017 2245   PHURINE 5.0 03/02/2017 2245   GLUCOSEU NEGATIVE 03/02/2017 2245   HGBUR NEGATIVE 03/02/2017 2245   BILIRUBINUR NEGATIVE 03/02/2017 2245   KETONESUR NEGATIVE 03/02/2017 2245   PROTEINUR NEGATIVE 03/02/2017 2245   UROBILINOGEN 1.0 06/16/2015 1545   NITRITE NEGATIVE 03/02/2017 2245   LEUKOCYTESUR NEGATIVE 03/02/2017 2245   Sepsis Labs: @LABRCNTIP (procalcitonin:4,lacticidven:4)  )No results found for this or any previous visit (from the past 240 hour(s)).    Radiology Studies: No results found.   Scheduled Meds: . benzonatate  200 mg Oral TID  . enoxaparin (LOVENOX) injection  40 mg Subcutaneous QHS  . lactose free nutrition  237 mL Oral TID WC  . levofloxacin  750 mg Oral Q48H  . losartan  100 mg Oral Daily  . methylPREDNISolone (SOLU-MEDROL) injection  60 mg Intravenous Daily  . tiotropium  18 mcg Inhalation Daily   Continuous Infusions:   LOS: 3 days   Time Spent in minutes   35 minutes  Nailyn Dearinger D.O. on 03/15/2017 at 12:17 PM  Between 7am to 7pm - Pager - [phone]  After 7pm go to [email or web address] - password [email or web address]  And look for the night coverage person covering for me after hours  [facility] Office  [phone]

## 2017-03-15 NOTE — Progress Notes (Signed)
OT Cancellation Note  Patient Details Name: Victoria Bird MRN: [id number] DOB: 1946-04-16   Cancelled Treatment:    Reason Eval/Treat Not Completed: Fatigue/lethargy limiting ability to participate after walking to bathroom. Will check back on another day.  Cru Kritikos 03/15/2017, 2:09 PM  Lesle Chris, OTR/L [phone] 03/15/2017

## 2017-03-15 NOTE — Progress Notes (Signed)
SATURATION QUALIFICATIONS: (This note is used to comply with regulatory documentation for home oxygen)  Patient Saturations on Room Air at Rest =91 %  Patient Saturations on Room Air while Ambulating = 89%  Patient Saturations on  Liters of oxygen while Ambulating = 93%  Please briefly explain why patient needs home oxygen: to maintain 02 sat of 90% or greater with ambulation.

## 2017-03-16 LAB — BASIC METABOLIC PANEL
Anion gap: 6 (ref 5–15)
BUN: 38 mg/dL — ABNORMAL HIGH (ref 6–20)
CALCIUM: 8.8 mg/dL — AB (ref 8.9–10.3)
CO2: 31 mmol/L (ref 22–32)
CREATININE: 1.35 mg/dL — AB (ref 0.44–1.00)
Chloride: 104 mmol/L (ref 101–111)
GFR, EST AFRICAN AMERICAN: 45 mL/min — AB (ref >60)
GFR, EST NON AFRICAN AMERICAN: 39 mL/min — AB (ref >60)
Glucose, Bld: 155 mg/dL — ABNORMAL HIGH (ref 65–99)
Potassium: 3.8 mmol/L (ref 3.5–5.1)
SODIUM: 141 mmol/L (ref 135–145)

## 2017-03-16 LAB — GLUCOSE, CAPILLARY
GLUCOSE-CAPILLARY: 193 mg/dL — AB (ref 65–99)
GLUCOSE-CAPILLARY: 172 mg/dL — AB (ref 65–99)
Glucose-Capillary: 144 mg/dL — ABNORMAL HIGH (ref 65–99)

## 2017-03-16 MED ORDER — PANTOPRAZOLE SODIUM 40 MG PO TBEC
40.0000 mg | DELAYED_RELEASE_TABLET | Freq: Every day | ORAL | Status: DC
Start: 2017-03-16 — End: 2017-03-21
  Administered 2017-03-16 – 2017-03-21 (×6): 40 mg via ORAL
  Filled 2017-03-16 (×6): qty 1

## 2017-03-16 MED ORDER — FAMOTIDINE 20 MG PO TABS
20.0000 mg | ORAL_TABLET | Freq: Two times a day (BID) | ORAL | Status: DC
  Administered 2017-03-16 – 2017-03-21 (×11): 20 mg via ORAL
  Filled 2017-03-16 (×11): qty 1

## 2017-03-16 NOTE — Care Management Important Message (Signed)
Important Message  Patient Details  Name: Victoria Bird MRN: [id number] Date of Birth: 02-25-1946   Medicare Important Message Given:  Yes    Kerin Salen 03/16/2017, 11:16 AMImportant Message  Patient Details  Name: Victoria Bird MRN: [id number] Date of Birth: Mar 13, 1946   Medicare Important Message Given:  Yes    Kerin Salen 03/16/2017, 11:16 AM

## 2017-03-16 NOTE — Progress Notes (Signed)
Occupational Therapy Treatment Patient Details Name: Victoria Bird MRN: [id number] DOB: 19-Aug-1946 Today's Date: 03/16/2017    History of present illness 71 yo female admitted with COPD exac. Hx of vertigo, DM, COPD, chronic pain, fibromyalgia.    OT comments  Reviewed energy conservation during ADL activity today.  Pt verbalizes understanding but needs cues to take rest breaks.  She tends to push through until she has to stop  Follow Up Recommendations  SNF, if pt is agreeable   Equipment Recommendations  3 in 1 bedside commode    Recommendations for Other Services      Precautions / Restrictions Precautions Precautions: Fall Precaution Comments: monitor O2 sats Restrictions Weight Bearing Restrictions: No       Mobility Bed Mobility Overal bed mobility: Modified Independent                Transfers                      Balance                                           ADL either performed or assessed with clinical judgement   ADL                                         General ADL Comments: pt sat up EOB for UB bathing and grooming tasks.  She tends to push herself through activities rather than resting, until she cannot continue. She does demonstrate pursed lip breathing without cues.  Gave her energy conservation handouts and reviewed them with her.       Vision       Perception     Praxis      Cognition Arousal/Alertness: Awake/alert Behavior During Therapy: WFL for tasks assessed/performed Overall Cognitive Status: Within Functional Limits for tasks assessed                                          Exercises     Shoulder Instructions       General Comments      Pertinent Vitals/ Pain       Pain Assessment: Faces Faces Pain Scale: Hurts little more Pain Location: back, ribs Pain Descriptors / Indicators: Aching Pain Intervention(s): Limited activity within patient's  tolerance;Monitored during session;Premedicated before session;Repositioned  Home Living                                          Prior Functioning/Environment              Frequency  Min 2X/week        Progress Toward Goals  OT Goals(current goals can now be found in the care plan section)  Progress towards OT goals: Progressing toward goals  Acute Rehab OT Goals Patient Stated Goal: to get better  Plan Discharge plan remains appropriate    Co-evaluation                 AM-PAC PT "6 Clicks" Daily Activity     Outcome Measure  Help from another person eating meals?: None Help from another person taking care of personal grooming?: A Little Help from another person toileting, which includes using toliet, bedpan, or urinal?: A Little Help from another person bathing (including washing, rinsing, drying)?: A Little Help from another person to put on and taking off regular upper body clothing?: A Little Help from another person to put on and taking off regular lower body clothing?: A Little 6 Click Score: 19    End of Session    OT Visit Diagnosis: Unsteadiness on feet (R26.81);Muscle weakness (generalized) (M62.81)   Activity Tolerance Patient limited by fatigue   Patient Left in bed;with call bell/phone within reach;with bed alarm set   Nurse Communication          Time: [phone] (RN present for meds) OT Time Calculation (min): 27 min  Charges: OT General Charges $OT Visit: 1 Procedure OT Treatments $Therapeutic Activity: 8-22 mins  Lesle Chris, OTR/L [phone] 03/16/2017   Tamirah George 03/16/2017, 11:37 AM

## 2017-03-16 NOTE — NC FL2 (Signed)
[place] MEDICAID FL2 LEVEL OF CARE SCREENING TOOL     IDENTIFICATION  Patient Name: Victoria Bird Birthdate: 07-Feb-1946 Sex: female Admission Date (Current Location): 03/12/2017  [name] and [place] Number:  Herbalist and Address:  [facility],  [place] [address], [place]      Provider Number: [id number]  Attending Physician Name and Address:  Cristal Ford, DO  Relative Name and Phone Number:       Current Level of Care: Hospital Recommended Level of Care: [place] Prior Approval Number:    Date Approved/Denied:   PASRR Number: [id number]  Discharge Plan: SNF    Current Diagnoses: Patient Active Problem List   Diagnosis Date Noted  . Vertigo 03/03/2017  . Fibromyalgia 01/18/2017  . Tobacco abuse 12/17/2016  . CKD (chronic kidney disease), stage III 12/15/2016  . COPD with acute exacerbation ([place]) 12/15/2016  . AAA (abdominal aortic aneurysm) without rupture ([place]) 06/08/2016  . Right-sided low back pain without sciatica   . Adjustment disorder with mixed anxiety and depressed mood 02/25/2016  . Overweight (BMI 25.0-29.9) 01/25/2016  . Allergic rhinitis 11/17/2015  . Tobacco use disorder 06/22/2015  . Diabetes mellitus type 2, controlled ([place]) 06/16/2015  . H/O recurrent pneumonia 05/27/2015  . Acute on chronic respiratory failure with hypoxia ([place]) 05/27/2015  . GERD (gastroesophageal reflux disease) 05/27/2015  . Hyperlipidemia 05/27/2015  . Chronic pain syndrome 05/27/2015  . Thyroid nodule 01/20/2015  . COPD (chronic obstructive pulmonary disease) ([place]) 10/09/2014  . COPD exacerbation ([place]) 04/10/2013  . Acute respiratory failure with hypoxia ([place]) 04/10/2013  . Hypertension 12/14/2011  . OSA (obstructive sleep apnea) 10/02/2008    Orientation RESPIRATION BLADDER Height & Weight     Self, Time, Situation, Place  O2 Continent Weight: 158 lb (71.7 kg) Height:  5\' 3"  (160 cm)  BEHAVIORAL  SYMPTOMS/MOOD NEUROLOGICAL BOWEL NUTRITION STATUS      Continent Diet (Regular)  AMBULATORY STATUS COMMUNICATION OF NEEDS Skin   Limited Assist Verbally Normal                       Personal Care Assistance Level of Assistance  Bathing, Feeding, Dressing Bathing Assistance: Limited assistance Feeding assistance: Independent Dressing Assistance: Limited assistance     Functional Limitations Info  Sight, Hearing, Speech Sight Info: Adequate Hearing Info: Adequate Speech Info: Adequate    SPECIAL CARE FACTORS FREQUENCY  PT (By licensed PT), OT (By licensed OT)     PT Frequency: Min 3X/week OT Frequency: Min 2X/week             Contractures Contractures Info: Not present    Additional Factors Info  Code Status, Allergies Code Status Info: Fullcode Allergies Info: Aspartame And Phenylalanine, Doxycycline, Tramadol, Codeine, Neurontin Gabapentin, Sulfa Antibiotics, Symbicort Budesonide-formoterol Fumarate, Penicillins, Prednisone           Current Medications (03/16/2017):  This is the current hospital active medication list Current Facility-Administered Medications  Medication Dose Route Frequency Provider Last Rate Last Dose  . acetaminophen (TYLENOL) tablet 650 mg  650 mg Oral Q6H PRN Gardiner Barefoot, NP   650 mg at 03/15/17 0820  . benzonatate (TESSALON) capsule 200 mg  200 mg Oral TID Cristal Ford, DO   200 mg at 03/16/17 1006  . camphor-menthol (SARNA) lotion   Topical PRN Cristal Ford, DO   1 application at 43/15/40 1101  . enoxaparin (LOVENOX) injection 40 mg  40 mg Subcutaneous QHS Etta Quill, DO  40 mg at 03/15/17 2107  . famotidine (PEPCID) tablet 20 mg  20 mg Oral BID Cristal Ford, DO   20 mg at 03/16/17 1006  . guaiFENesin-dextromethorphan (ROBITUSSIN DM) 100-10 MG/5ML syrup 5 mL  5 mL Oral Q4H PRN Hosie Poisson, MD   5 mL at 03/14/17 1439  . hydrALAZINE (APRESOLINE) injection 10 mg  10 mg Intravenous Q6H PRN Cristal Ford, DO       . HYDROcodone-acetaminophen (NORCO/VICODIN) 5-325 MG per tablet 1-2 tablet  1-2 tablet Oral Q4H PRN Hosie Poisson, MD   2 tablet at 03/16/17 1232  . HYDROcodone-homatropine (HYCODAN) 5-1.5 MG/5ML syrup 5 mL  5 mL Oral Q4H PRN Mikhail, Velta Addison, DO      . ipratropium-albuterol (DUONEB) 0.5-2.5 (3) MG/3ML nebulizer solution 3 mL  3 mL Nebulization Q4H PRN Hosie Poisson, MD   3 mL at 03/14/17 1206  . lactose free nutrition (BOOST PLUS) liquid 237 mL  237 mL Oral TID WC Hosie Poisson, MD   237 mL at 03/16/17 1232  . levofloxacin (LEVAQUIN) tablet 750 mg  750 mg Oral Q48H Poindexter, Leann T, RPH   750 mg at 03/14/17 2213  . losartan (COZAAR) tablet 100 mg  100 mg Oral Daily Jennette Kettle M, DO   100 mg at 03/16/17 1006  . methylPREDNISolone sodium succinate (SOLU-MEDROL) 125 mg/2 mL injection 60 mg  60 mg Intravenous Daily Cristal Ford, DO   60 mg at 03/16/17 1007  . morphine 2 MG/ML injection 1-2 mg  1-2 mg Intravenous Q4H PRN Hosie Poisson, MD      . ondansetron (ZOFRAN) injection 4 mg  4 mg Intravenous Q6H PRN Cristal Ford, DO   4 mg at 03/15/17 1622  . pantoprazole (PROTONIX) EC tablet 40 mg  40 mg Oral Daily Cristal Ford, DO   40 mg at 03/16/17 1006  . tiotropium (SPIRIVA) inhalation capsule 18 mcg  18 mcg Inhalation Daily Jennette Kettle M, DO   18 mcg at 03/16/17 5102     Discharge Medications: Please see discharge summary for a list of discharge medications.  Relevant Imaging Results:  Relevant Lab Results:   Additional Information ssn: [id number]  Lia Hopping, LCSW

## 2017-03-16 NOTE — Progress Notes (Signed)
Date:  March 16, 2017 Chart reviewed for concurrent status and case management needs. Will continue to follow patient progress. Discharge Planning: following for needs Expected discharge date: [id number] Rhonda Davis, BSN, RN3, CCM   [phone] 

## 2017-03-16 NOTE — Progress Notes (Signed)
PROGRESS NOTE    Victoria Bird  PFX:[id number] DOB: 1945-12-15 DOA: 03/12/2017 PCP: Hoyt Koch, MD   Chief Complaint  Patient presents with  . Shortness of Breath  . Cough    Brief Narrative:  HPI on 03/12/2017 by Dr. Jennette Kettle Victoria Bird is a 71 y.o. female with medical history significant of COPD, HTN, ?DM (no meds), cataracts.  Patient was actually supposed to have eye surgery today for cataract in eye but this was canceled.  Patient presents to the ED with several day history of SOB, wheezing, cough.  She has been unable to smoke for past 2 days due to severity of SOB.  Of note patient was admitted to hospital for observation for vertigo last week.  Interim history Very slow improvement in patient's cough and breathing.   Assessment & Plan   Acute respiratory failure with hypoxia/ Acute COPD exacerbation -Continue levaquin (renally adjusted), supplemental oxygen to maintain O2 sats > 90%, antitussives -Chest x-ray showed chronic interstitial coarsening, no acute cardiopulmonary findings -Currently on IV solumedrol, continue to wean solumedrol as possible -Refuses prednisone. She was told she is losing her vision secondary to chronic prednisone use. -Continue Spiriva -on 03/14/17, Discussed with Dr. Ashok Cordia, pulmonology, suggested pulmicort 0.5 neb BID, possibly brovana -Feels tessalon has helped her cough, however continues to cough and has not used robitussin.  -Added Hycodan to help with patient's cough however patient has not been requesting this medication she does not like cough syrups. Explained to patient there are not many more options and trying to help her relieve her cough and this may actually provide her some relief so she can rest. -O2 sats monitored with and without oxygen at rest and ambulation. Patient maintained oxygen saturations on room air above 89% with ambulation.  Diabetes mellitus, type II -Patient refuses to be on insulin sliding  scale -on metformin at home  Palmar erythema -Unknown etiology -Possibly secondary to hemochromatosis (as Hb was 15.2) versus COPD -Feels erythema mildly improving -Anemia panel, reviewed and within normal limits -Improving with Sarna  Essential hypertension -Patient takes losartan/HCTZ at home -continue losartan -Continue IV hydralazine PRN  Chronic kidney stage III -Creatinine appears to be at baseline, continue to monitor BMP  Leukocytosis -Likely secondary to steroids -Resolved  Tobacco abuse -Discussed smoking cessation  Deconditioning -Likely secondary to patient's comorbid conditions -PT and OT consulted, recommended SNF, rolling walker, 3in1 bedside commode -Discussed SNF and discharge planning with the patient. Explained to patient at that obtaining rehabilitation placement normally does require work ahead of time therefore patient should allow social work to speak with her and send her information to different facilities. Explained that this does not necessarily mean patient has to go to rehabilitation however at least we have started the process in case she does decide for rehabilitation placement.  DVT Prophylaxis  Lovenox  Code Status: Full  Family Communication: none at bedside  Disposition Plan: Admitted. Dispo TBD  Consultants Pulmonology, Dr. Ashok Cordia, via phone  Procedures  None  Antibiotics   Anti-infectives    Start     Dose/Rate Route Frequency Ordered Stop   03/12/17 2200  levofloxacin (LEVAQUIN) tablet 750 mg     750 mg Oral Every 48 hours 03/12/17 2126     03/12/17 2115  levofloxacin (LEVAQUIN) tablet 750 mg  Status:  Discontinued     750 mg Oral Daily 03/12/17 2108 03/12/17 2125      Subjective:   Victoria Bird seen and examined today.  Patient  continues to complain of feeling weak and run down, feels her cough is not improved. Does not like taking cough syrup. Feels she is unable to rest. Denies current chest pain, abdominal pain,  nausea or vomiting, constipation, diarrhea, dizziness or headache. Feels her shortness of breath has improved mildly but not back to her baseline.   Objective:   Vitals:   03/15/17 1434 03/15/17 2021 03/16/17 0453 03/16/17 0825  BP:  138/63 (!) 146/64   Pulse:  60 71   Resp:  17 18   Temp:  97.9 F (36.6 C) 97.8 F (36.6 C)   TempSrc:  Oral Oral   SpO2: 91% 97% 98% 95%  Weight:      Height:        Intake/Output Summary (Last 24 hours) at 03/16/17 1119 Last data filed at 03/16/17 0830  Gross per 24 hour  Intake              620 ml  Output                0 ml  Net              620 ml   Filed Weights   03/12/17 1431  Weight: 71.7 kg (158 lb)   Exam  General: Well developed, elderly, chronically ill-appearing  HEENT: NCAT, mucous membranes moist.   Cardiovascular: S1 S2 auscultated, no murmurs rubs or gallops noted. RRR  Respiratory: Diminished breath sounds, diffuse expiratory wheezing, consistent dry cough  Abdomen: Soft, nontender, nondistended, + bowel sounds  Extremities: warm dry without cyanosis clubbing or edema  Neuro: AAOx3, nonfocal  Skin: Dry, mild erythema on patient's palms, improving  Psych: appropriate mood and affect  Data Reviewed: I have personally reviewed following labs and imaging studies  CBC:  Recent Labs Lab 03/12/17 1548 03/14/17 0821 03/15/17 0751  WBC 8.8 13.2* 10.2  NEUTROABS 5.2  --   --   HGB 15.2* 14.7 14.7  HCT 45.4 44.5 46.1*  MCV 87.0 86.2 89.3  PLT 199 244 983   Basic Metabolic Panel:  Recent Labs Lab 03/12/17 1548 03/14/17 0821 03/15/17 0751 03/16/17 0534  NA 138 138 141 141  K 4.0 4.6 4.1 3.8  CL 106 102 102 104  CO2 27 27 31 31   GLUCOSE 93 171* 99 155*  BUN 20 42* 44* 38*  CREATININE 1.23* 1.44* 1.48* 1.35*  CALCIUM 8.9 9.2 8.9 8.8*   GFR: Estimated Creatinine Clearance: 37.3 mL/min (A) (by C-G formula based on SCr of 1.35 mg/dL (H)). Liver Function Tests:  Recent Labs Lab 03/12/17 1548  AST 20   ALT 20  ALKPHOS 73  BILITOT 0.9  PROT 6.9  ALBUMIN 3.7   No results for input(s): LIPASE, AMYLASE in the last 168 hours. No results for input(s): AMMONIA in the last 168 hours. Coagulation Profile: No results for input(s): INR, PROTIME in the last 168 hours. Cardiac Enzymes: No results for input(s): CKTOTAL, CKMB, CKMBINDEX, TROPONINI in the last 168 hours. BNP (last 3 results) No results for input(s): PROBNP in the last 8760 hours. HbA1C: No results for input(s): HGBA1C in the last 72 hours. CBG:  Recent Labs Lab 03/14/17 2115 03/15/17 0724 03/15/17 1209 03/15/17 1729 03/15/17 2141  GLUCAP 123* 84 101* 149* 213*   Lipid Profile: No results for input(s): CHOL, HDL, LDLCALC, TRIG, CHOLHDL, LDLDIRECT in the last 72 hours. Thyroid Function Tests: No results for input(s): TSH, T4TOTAL, FREET4, T3FREE, THYROIDAB in the last 72 hours. Anemia Panel:  Recent  Labs  03/13/17 1411  FERRITIN 92  TIBC 316  IRON 58   Urine analysis:    Component Value Date/Time   COLORURINE YELLOW 03/02/2017 2245   APPEARANCEUR HAZY (A) 03/02/2017 2245   LABSPEC 1.008 03/02/2017 2245   PHURINE 5.0 03/02/2017 2245   GLUCOSEU NEGATIVE 03/02/2017 2245   HGBUR NEGATIVE 03/02/2017 2245   BILIRUBINUR NEGATIVE 03/02/2017 2245   KETONESUR NEGATIVE 03/02/2017 2245   PROTEINUR NEGATIVE 03/02/2017 2245   UROBILINOGEN 1.0 06/16/2015 1545   NITRITE NEGATIVE 03/02/2017 2245   LEUKOCYTESUR NEGATIVE 03/02/2017 2245   Sepsis Labs: @LABRCNTIP (procalcitonin:4,lacticidven:4)  )No results found for this or any previous visit (from the past 240 hour(s)).    Radiology Studies: No results found.   Scheduled Meds: . benzonatate  200 mg Oral TID  . enoxaparin (LOVENOX) injection  40 mg Subcutaneous QHS  . famotidine  20 mg Oral BID  . lactose free nutrition  237 mL Oral TID WC  . levofloxacin  750 mg Oral Q48H  . losartan  100 mg Oral Daily  . methylPREDNISolone (SOLU-MEDROL) injection  60 mg  Intravenous Daily  . pantoprazole  40 mg Oral Daily  . tiotropium  18 mcg Inhalation Daily   Continuous Infusions:   LOS: 4 days   Time Spent in minutes   35 minutes  Jaren Kearn D.O. on 03/16/2017 at 11:19 AM  Between 7am to 7pm - Pager - [phone]  After 7pm go to [email or web address] - password [email or web address]  And look for the night coverage person covering for me after hours  [facility] Office  [phone]

## 2017-03-16 NOTE — Progress Notes (Signed)
Physical Therapy Treatment Patient Details Name: Victoria Bird MRN: [id number] DOB: 1946/07/22 Today's Date: 03/16/2017    History of Present Illness 71 yo female admitted with COPD exac. Hx of vertigo, DM, COPD, chronic pain, fibromyalgia.     PT Comments    Pt progressing with gait but continues to fatigue easily; O2 sats decr to 79% with amb on RA today; will continue to follow; reinforced education regarding energy conservation as adjunct to OT session earlier today   Follow Up Recommendations  Home health PT;SNF (depending on progress and home support-likely home)     Equipment Recommendations  None recommended by PT    Recommendations for Other Services       Precautions / Restrictions Precautions Precautions: Fall Precaution Comments: monitor O2 sats Restrictions Weight Bearing Restrictions: No    Mobility  Bed Mobility Overal bed mobility: Modified Independent Bed Mobility: Supine to Sit;Sit to Supine     Supine to sit: Modified independent (Device/Increase time) Sit to supine: Modified independent (Device/Increase time)      Transfers Overall transfer level: Needs assistance Equipment used: Straight cane Transfers: Sit to/from Stand Sit to Stand: Min guard;Supervision         General transfer comment: for safety  Ambulation/Gait Ambulation/Gait assistance: Min guard Ambulation Distance (Feet): 60 Feet Assistive device: Straight cane Gait Pattern/deviations: Step-through pattern;Decreased stride length     General Gait Details: pt initially unsteady and fatigues quickly but no overt LOB; O2 sats decr to 79% on RA during amb   Stairs            Wheelchair Mobility    Modified Rankin (Stroke Patients Only)       Balance     Sitting balance-Leahy Scale: Good       Standing balance-Leahy Scale: Fair                              Cognition Arousal/Alertness: Awake/alert Behavior During Therapy: WFL for tasks  assessed/performed Overall Cognitive Status: Within Functional Limits for tasks assessed                                        Exercises      General Comments        Pertinent Vitals/Pain Pain Assessment: Faces Faces Pain Scale: Hurts little more Pain Location: back, ribs--pain with coughing  Pain Descriptors / Indicators: Aching Pain Intervention(s): Monitored during session    Home Living                      Prior Function            PT Goals (current goals can now be found in the care plan section) Acute Rehab PT Goals Patient Stated Goal: to get better PT Goal Formulation: With patient Time For Goal Achievement: 03/27/17 Potential to Achieve Goals: Good Progress towards PT goals: Progressing toward goals    Frequency    Min 3X/week      PT Plan Current plan remains appropriate    Co-evaluation              AM-PAC PT "6 Clicks" Daily Activity  Outcome Measure  Difficulty turning over in bed (including adjusting bedclothes, sheets and blankets)?: None Difficulty moving from lying on back to sitting on the side of the bed? : None Difficulty sitting  down on and standing up from a chair with arms (e.g., wheelchair, bedside commode, etc,.)?: A Little Help needed moving to and from a bed to chair (including a wheelchair)?: A Little Help needed walking in hospital room?: A Little Help needed climbing 3-5 steps with a railing? : A Little 6 Click Score: 20    End of Session Equipment Utilized During Treatment: Gait belt;Oxygen Activity Tolerance: Patient limited by fatigue Patient left: in bed;with call bell/phone within reach   PT Visit Diagnosis: Muscle weakness (generalized) (M62.81);Difficulty in walking, not elsewhere classified (R26.2)     Time: 5927-6394 PT Time Calculation (min) (ACUTE ONLY): 16 min  Charges:  $Gait Training: 8-22 mins                    G Codes:          Ripley Bogosian 2017-03-18, 1:04  PM

## 2017-03-16 NOTE — Progress Notes (Addendum)
CSW met with patient at bedside, to follow up about possible SNF placement. Patient reports she is still undecided between home vs. SNF. CSW explained the SNF process to patient, and having a  plan before discharge. Patient request CSW to return tomorrow,she plans to have a decision.   CSW physician and patient/patient agreeable for CSW to fax information to SNF for plan but still undecided Home vs. SNF.  CSW will follow up with patient tomorrow with bed offers.   Kathrin Greathouse, Latanya Presser, MSW Clinical Social Worker [facility] [phone] 03/16/2017  10:47 AM

## 2017-03-17 LAB — GLUCOSE, CAPILLARY
GLUCOSE-CAPILLARY: 98 mg/dL (ref 65–99)
GLUCOSE-CAPILLARY: 267 mg/dL — AB (ref 65–99)
GLUCOSE-CAPILLARY: 142 mg/dL — AB (ref 65–99)
Glucose-Capillary: 118 mg/dL — ABNORMAL HIGH (ref 65–99)

## 2017-03-17 MED ORDER — METHYLPREDNISOLONE SODIUM SUCC 40 MG IJ SOLR
40.0000 mg | Freq: Every day | INTRAMUSCULAR | Status: DC
  Administered 2017-03-17: 40 mg via INTRAVENOUS
  Filled 2017-03-17: qty 1

## 2017-03-17 NOTE — Progress Notes (Signed)
PROGRESS NOTE    Victoria Bird  WJX:[id number] DOB: December 04, 1945 DOA: 03/12/2017 PCP: Hoyt Koch, MD   Chief Complaint  Patient presents with  . Shortness of Breath  . Cough    Brief Narrative:  HPI on 03/12/2017 by Dr. Jennette Kettle Victoria Bird is a 71 y.o. female with medical history significant of COPD, HTN, ?DM (no meds), cataracts.  Patient was actually supposed to have eye surgery today for cataract in eye but this was canceled.  Patient presents to the ED with several day history of SOB, wheezing, cough.  She has been unable to smoke for past 2 days due to severity of SOB.  Of note patient was admitted to hospital for observation for vertigo last week.  Interim history Very slow improvement in patient's cough and breathing.   Assessment & Plan   Acute respiratory failure with hypoxia/ Acute COPD exacerbation -Continue levaquin (renally adjusted), supplemental oxygen to maintain O2 sats > 90%, antitussives -Chest x-ray showed chronic interstitial coarsening, no acute cardiopulmonary findings -Currently on IV solumedrol, continue to wean solumedrol as possible -Refuses prednisone. She was told she is losing her vision secondary to chronic prednisone use. -Continue Spiriva -on 03/14/17, Discussed with Dr. Ashok Cordia, pulmonology, suggested pulmicort 0.5 neb BID, possibly brovana -Feels tessalon has helped her cough, however continues to cough and has not used robitussin.  -Added Hycodan to help with patient's cough however patient has not been requesting this medication she does not like cough syrups. Explained to patient there are not many more options and trying to help her relieve her cough and this may actually provide her some relief so she can rest. -O2 sats monitored with and without oxygen at rest and ambulation with physical therapy, O2 sats decreased to 79% on room air with ambulation.  Diabetes mellitus, type II -Patient refuses to be on insulin sliding  scale -on metformin at home  Palmar erythema -Unknown etiology -Possibly secondary to hemochromatosis (as Hb was 15.2) versus COPD -Feels erythema mildly improving -Anemia panel, reviewed and within normal limits -Improving with Sarna  Essential hypertension -Patient takes losartan/HCTZ at home -continue losartan -Continue IV hydralazine PRN  Chronic kidney stage III -Creatinine appears to be at baseline, continue to monitor BMP  Leukocytosis -Likely secondary to steroids -Resolved  Tobacco abuse -Discussed smoking cessation  Deconditioning -Likely secondary to patient's comorbid conditions -PT and OT consulted, initially recommending SNF, rolling walker, 3in1 bedside commode- given progress -Discussed SNF and discharge planning with the patient. Explained to patient at that obtaining rehabilitation placement normally does require work ahead of time therefore patient should allow social work to speak with her and send her information to different facilities. Explained that this does not necessarily mean patient has to go to rehabilitation however at least we have started the process in case she does decide for rehabilitation placement.  DVT Prophylaxis  Lovenox  Code Status: Full  Family Communication: none at bedside  Disposition Plan: Admitted. Dispo TBD  Consultants Pulmonology, Dr. Ashok Cordia, via phone  Procedures  None  Antibiotics   Anti-infectives    Start     Dose/Rate Route Frequency Ordered Stop   03/12/17 2200  levofloxacin (LEVAQUIN) tablet 750 mg     750 mg Oral Every 48 hours 03/12/17 2126     03/12/17 2115  levofloxacin (LEVAQUIN) tablet 750 mg  Status:  Discontinued     750 mg Oral Daily 03/12/17 2108 03/12/17 2125      Subjective:   Victoria Bird seen  and examined today.  Patient continues to have cough however feels that it is improved mildly. Patient complains of pain and restlessness with her cough. Feels breathing is not back to baseline.  Denies chest pain, abdominal pain, nausea or vomiting, diarrhea or constipation, headache. Complains of some dizziness with changing in position from time to time.  Objective:   Vitals:   03/16/17 2234 03/16/17 2319 03/17/17 0610 03/17/17 1009  BP: (!) 176/69 (!) 155/68 (!) 161/77   Pulse: 60  63   Resp: 19  19 18   Temp: 97.8 F (36.6 C)  97.6 F (36.4 C) 97.8 F (36.6 C)  TempSrc: Oral  Oral Oral  SpO2: 95%  98% 95%  Weight:      Height:        Intake/Output Summary (Last 24 hours) at 03/17/17 1233 Last data filed at 03/17/17 1010  Gross per 24 hour  Intake              840 ml  Output                0 ml  Net              840 ml   Filed Weights   03/12/17 1431  Weight: 71.7 kg (158 lb)   Exam  General: Well developed, elderly, chronically ill appearing, no acute distress  HEENT: NCAT, mucous membranes moist.   Cardiovascular: S1 S2 auscultated, RRR, no murmurs  Respiratory: Diminished breath sounds, more air movement today. Diffuse expiratory wheezing with consistent dry cough  Abdomen: Soft, nontender, nondistended, + bowel sounds  Extremities: warm dry without cyanosis clubbing or edema  Neuro: AAOx3, nonfocal  Skin: Without rashes exudates or nodules, mild palmar erythema improved  Psych: appropriate mood and affect  Data Reviewed: I have personally reviewed following labs and imaging studies  CBC:  Recent Labs Lab 03/12/17 1548 03/14/17 0821 03/15/17 0751  WBC 8.8 13.2* 10.2  NEUTROABS 5.2  --   --   HGB 15.2* 14.7 14.7  HCT 45.4 44.5 46.1*  MCV 87.0 86.2 89.3  PLT 199 244 357   Basic Metabolic Panel:  Recent Labs Lab 03/12/17 1548 03/14/17 0821 03/15/17 0751 03/16/17 0534  NA 138 138 141 141  K 4.0 4.6 4.1 3.8  CL 106 102 102 104  CO2 27 27 31 31   GLUCOSE 93 171* 99 155*  BUN 20 42* 44* 38*  CREATININE 1.23* 1.44* 1.48* 1.35*  CALCIUM 8.9 9.2 8.9 8.8*   GFR: Estimated Creatinine Clearance: 37.3 mL/min (A) (by C-G formula based  on SCr of 1.35 mg/dL (H)). Liver Function Tests:  Recent Labs Lab 03/12/17 1548  AST 20  ALT 20  ALKPHOS 73  BILITOT 0.9  PROT 6.9  ALBUMIN 3.7   No results for input(s): LIPASE, AMYLASE in the last 168 hours. No results for input(s): AMMONIA in the last 168 hours. Coagulation Profile: No results for input(s): INR, PROTIME in the last 168 hours. Cardiac Enzymes: No results for input(s): CKTOTAL, CKMB, CKMBINDEX, TROPONINI in the last 168 hours. BNP (last 3 results) No results for input(s): PROBNP in the last 8760 hours. HbA1C: No results for input(s): HGBA1C in the last 72 hours. CBG:  Recent Labs Lab 03/16/17 1210 03/16/17 1702 03/16/17 2230 03/17/17 0732 03/17/17 1116  GLUCAP 144* 193* 172* 98 118*   Lipid Profile: No results for input(s): CHOL, HDL, LDLCALC, TRIG, CHOLHDL, LDLDIRECT in the last 72 hours. Thyroid Function Tests: No results for input(s): TSH, T4TOTAL,  FREET4, T3FREE, THYROIDAB in the last 72 hours. Anemia Panel: No results for input(s): VITAMINB12, FOLATE, FERRITIN, TIBC, IRON, RETICCTPCT in the last 72 hours. Urine analysis:    Component Value Date/Time   COLORURINE YELLOW 03/02/2017 2245   APPEARANCEUR HAZY (A) 03/02/2017 2245   LABSPEC 1.008 03/02/2017 2245   PHURINE 5.0 03/02/2017 2245   GLUCOSEU NEGATIVE 03/02/2017 2245   HGBUR NEGATIVE 03/02/2017 2245   BILIRUBINUR NEGATIVE 03/02/2017 2245   KETONESUR NEGATIVE 03/02/2017 2245   PROTEINUR NEGATIVE 03/02/2017 2245   UROBILINOGEN 1.0 06/16/2015 1545   NITRITE NEGATIVE 03/02/2017 2245   LEUKOCYTESUR NEGATIVE 03/02/2017 2245   Sepsis Labs: @LABRCNTIP (procalcitonin:4,lacticidven:4)  )No results found for this or any previous visit (from the past 240 hour(s)).    Radiology Studies: No results found.   Scheduled Meds: . benzonatate  200 mg Oral TID  . enoxaparin (LOVENOX) injection  40 mg Subcutaneous QHS  . famotidine  20 mg Oral BID  . lactose free nutrition  237 mL Oral TID WC    . levofloxacin  750 mg Oral Q48H  . losartan  100 mg Oral Daily  . methylPREDNISolone (SOLU-MEDROL) injection  40 mg Intravenous Daily  . pantoprazole  40 mg Oral Daily  . tiotropium  18 mcg Inhalation Daily   Continuous Infusions:   LOS: 5 days   Time Spent in minutes   35 minutes  Alonnah Lampkins D.O. on 03/17/2017 at 12:33 PM  Between 7am to 7pm - Pager - [phone]  After 7pm go to [email or web address] - password [email or web address]  And look for the night coverage person covering for me after hours  [facility]  [phone]

## 2017-03-18 ENCOUNTER — Inpatient Hospital Stay (HOSPITAL_COMMUNITY): Payer: Medicare Other

## 2017-03-18 DIAGNOSIS — K219 Gastro-esophageal reflux disease without esophagitis: Secondary | ICD-10-CM

## 2017-03-18 DIAGNOSIS — R05 Cough: Secondary | ICD-10-CM

## 2017-03-18 LAB — BASIC METABOLIC PANEL
ANION GAP: 7 (ref 5–15)
BUN: 37 mg/dL — AB (ref 6–20)
CHLORIDE: 106 mmol/L (ref 101–111)
CO2: 28 mmol/L (ref 22–32)
Calcium: 8.4 mg/dL — ABNORMAL LOW (ref 8.9–10.3)
Creatinine, Ser: 1.27 mg/dL — ABNORMAL HIGH (ref 0.44–1.00)
GFR calc Af Amer: 49 mL/min — ABNORMAL LOW (ref >60)
GFR, EST NON AFRICAN AMERICAN: 42 mL/min — AB (ref >60)
GLUCOSE: 142 mg/dL — AB (ref 65–99)
POTASSIUM: 4 mmol/L (ref 3.5–5.1)
SODIUM: 141 mmol/L (ref 135–145)

## 2017-03-18 LAB — GLUCOSE, CAPILLARY
GLUCOSE-CAPILLARY: 134 mg/dL — AB (ref 65–99)
GLUCOSE-CAPILLARY: 154 mg/dL — AB (ref 65–99)
Glucose-Capillary: 144 mg/dL — ABNORMAL HIGH (ref 65–99)
Glucose-Capillary: 158 mg/dL — ABNORMAL HIGH (ref 65–99)

## 2017-03-18 MED ORDER — BUDESONIDE 0.25 MG/2ML IN SUSP
0.2500 mg | Freq: Two times a day (BID) | RESPIRATORY_TRACT | Status: DC
  Administered 2017-03-19 – 2017-03-21 (×5): 0.25 mg via RESPIRATORY_TRACT
  Filled 2017-03-18 (×6): qty 2

## 2017-03-18 MED ORDER — FLUTICASONE PROPIONATE 50 MCG/ACT NA SUSP
1.0000 | Freq: Every day | NASAL | Status: DC
  Filled 2017-03-18: qty 16

## 2017-03-18 MED ORDER — MORPHINE SULFATE (PF) 4 MG/ML IV SOLN
1.0000 mg | INTRAVENOUS | Status: DC | PRN
  Administered 2017-03-21: 1 mg via INTRAVENOUS
  Filled 2017-03-18: qty 1

## 2017-03-18 NOTE — Clinical Social Work Note (Signed)
CSW met with patient at bedside this afternoon. Patient has been undecided re: Home with Home Health vs. SNF.  (PT feels patient could potentially benefit from either option).  Patient states that as of now- she plans to return home with home health. States she has family at home that can help her.  SNF search has been completed and bed offers in place in case she changes her mind but otherwise- plan d/c home and SW will sign off for now. CSW services will be available if she changes her mind.  Lorie Phenix. Pauline Good, [place]  (weekend coverage)

## 2017-03-18 NOTE — Progress Notes (Addendum)
PROGRESS NOTE    Victoria Bird  XBL:[id number] DOB: 03/01/1946 DOA: 03/12/2017 PCP: Hoyt Koch, MD   Chief Complaint  Patient presents with  . Shortness of Breath  . Cough    Brief Narrative:  HPI on 03/12/2017 by Dr. Jennette Kettle Victoria Bird is a 71 y.o. female with medical history significant of COPD, HTN, ?DM (no meds), cataracts.  Patient was actually supposed to have eye surgery today for cataract in eye but this was canceled.  Patient presents to the ED with several day history of SOB, wheezing, cough.  She has been unable to smoke for past 2 days due to severity of SOB.  Of note patient was admitted to hospital for observation for vertigo last week.  Interim history Very slow improvement in patient's cough and breathing.   Assessment & Plan   Acute respiratory failure with hypoxia/ Acute COPD exacerbation -Continue levaquin (renally adjusted), supplemental oxygen to maintain O2 sats > 90%, antitussives -Chest x-ray showed chronic interstitial coarsening, no acute cardiopulmonary findings -Currently on IV solumedrol, weaned. Will discontinue solumedrol -Refuses prednisone. She was told she is losing her vision secondary to chronic prednisone use. -Continue Spiriva -on 03/14/17, Discussed with Dr. Ashok Cordia, pulmonology, suggested pulmicort 0.5 neb BID, possibly brovana -Feels tessalon has helped her cough, however continues to cough and has not used robitussin.  -Added Hycodan to help with patient's cough however patient has not been requesting this medication she does not like cough syrups. Explained to patient there are not many more options and trying to help her relieve her cough and this may actually provide her some relief so she can rest. -O2 sats monitored with and without oxygen at rest and ambulation with physical therapy, O2 sats decreased to 79% on room air with ambulation. -Repeated CXR today, unchanged from prior, no infiltrate noted -Will add on  pulmicort nebs  Diabetes mellitus, type II -Patient refuses to be on insulin sliding scale -on metformin at home  Palmar erythema -Unknown etiology -Possibly secondary to hemochromatosis (as Hb was 15.2) versus COPD -Feels erythema mildly improving -Anemia panel, reviewed and within normal limits -Improved with Sarna  Essential hypertension -Patient takes losartan/HCTZ at home -continue losartan -Continue IV hydralazine PRN  Chronic kidney stage III -Creatinine appears to be at baseline, continue to monitor BMP  Leukocytosis -Likely secondary to steroids -Resolved  Tobacco abuse -Discussed smoking cessation  GERD -added H2 Blocker and continue PPI  Deconditioning -Likely secondary to patient's comorbid conditions -PT and OT consulted, initially recommending SNF, rolling walker, 3in1 bedside commode- given progress -Discussed SNF and discharge planning with the patient. Explained to patient at that obtaining rehabilitation placement normally does require work ahead of time therefore patient should allow social work to speak with her and send her information to different facilities. Explained that this does not necessarily mean patient has to go to rehabilitation however at least we have started the process in case she does decide for rehabilitation placement.  DVT Prophylaxis  Lovenox  Code Status: Full  Family Communication: none at bedside  Disposition Plan: Admitted. Dispo TBD  Consultants Pulmonology, Dr. Ashok Cordia, via phone  Procedures  None  Antibiotics   Anti-infectives    Start     Dose/Rate Route Frequency Ordered Stop   03/12/17 2200  levofloxacin (LEVAQUIN) tablet 750 mg     750 mg Oral Every 48 hours 03/12/17 2126     03/12/17 2115  levofloxacin (LEVAQUIN) tablet 750 mg  Status:  Discontinued  750 mg Oral Daily 03/12/17 2108 03/12/17 2125      Subjective:   Victoria Bird seen and examined today.  Patient continues to have cough,  nonproductive. Cough making her dizzy and feeling as if she is going to pass out. She is unable to rest secondary to cough. She does have shortness of breath which increases with her cough. Denies chest pain, abdominal pain, nausea or vomiting, diarrhea or constipation.  Objective:   Vitals:   03/17/17 1530 03/17/17 1736 03/17/17 2218 03/18/17 0530  BP:  134/61 (!) 141/71 (!) 148/81  Pulse:  75 66 66  Resp:   18 18  Temp: 98.2 F (36.8 C)  98.1 F (36.7 C) 98.3 F (36.8 C)  TempSrc: Oral  Oral Oral  SpO2:   95% 97%  Weight:      Height:        Intake/Output Summary (Last 24 hours) at 03/18/17 1348 Last data filed at 03/18/17 0910  Gross per 24 hour  Intake              720 ml  Output                0 ml  Net              720 ml   Filed Weights   03/12/17 1431  Weight: 71.7 kg (158 lb)   Exam  General: Well developed, chronically ill-appearing, elderly, in no distress  HEENT: NCAT, mucous membranes moist.   Cardiovascular: S1 S2 auscultated, no rubs, murmurs or gallops. Regular rate and rhythm.  Respiratory: Diminished breath sounds, mild diffuse expiratory wheezing with persistent cough, dry  Abdomen: Soft, nontender, nondistended, + bowel sounds  Extremities: warm dry without cyanosis clubbing or edema  Neuro: AAOx3, nonfocal  Psych: Appropriate mood and affect  Data Reviewed: I have personally reviewed following labs and imaging studies  CBC:  Recent Labs Lab 03/12/17 1548 03/14/17 0821 03/15/17 0751  WBC 8.8 13.2* 10.2  NEUTROABS 5.2  --   --   HGB 15.2* 14.7 14.7  HCT 45.4 44.5 46.1*  MCV 87.0 86.2 89.3  PLT 199 244 203   Basic Metabolic Panel:  Recent Labs Lab 03/12/17 1548 03/14/17 0821 03/15/17 0751 03/16/17 0534 03/18/17 0715  NA 138 138 141 141 141  K 4.0 4.6 4.1 3.8 4.0  CL 106 102 102 104 106  CO2 27 27 31 31 28   GLUCOSE 93 171* 99 155* 142*  BUN 20 42* 44* 38* 37*  CREATININE 1.23* 1.44* 1.48* 1.35* 1.27*  CALCIUM 8.9 9.2 8.9  8.8* 8.4*   GFR: Estimated Creatinine Clearance: 39.7 mL/min (A) (by C-G formula based on SCr of 1.27 mg/dL (H)). Liver Function Tests:  Recent Labs Lab 03/12/17 1548  AST 20  ALT 20  ALKPHOS 73  BILITOT 0.9  PROT 6.9  ALBUMIN 3.7   No results for input(s): LIPASE, AMYLASE in the last 168 hours. No results for input(s): AMMONIA in the last 168 hours. Coagulation Profile: No results for input(s): INR, PROTIME in the last 168 hours. Cardiac Enzymes: No results for input(s): CKTOTAL, CKMB, CKMBINDEX, TROPONINI in the last 168 hours. BNP (last 3 results) No results for input(s): PROBNP in the last 8760 hours. HbA1C: No results for input(s): HGBA1C in the last 72 hours. CBG:  Recent Labs Lab 03/17/17 1116 03/17/17 1716 03/17/17 2225 03/18/17 0720 03/18/17 1133  GLUCAP 118* 267* 142* 134* 144*   Lipid Profile: No results for input(s): CHOL, HDL, LDLCALC,  TRIG, CHOLHDL, LDLDIRECT in the last 72 hours. Thyroid Function Tests: No results for input(s): TSH, T4TOTAL, FREET4, T3FREE, THYROIDAB in the last 72 hours. Anemia Panel: No results for input(s): VITAMINB12, FOLATE, FERRITIN, TIBC, IRON, RETICCTPCT in the last 72 hours. Urine analysis:    Component Value Date/Time   COLORURINE YELLOW 03/02/2017 2245   APPEARANCEUR HAZY (A) 03/02/2017 2245   LABSPEC 1.008 03/02/2017 2245   PHURINE 5.0 03/02/2017 2245   GLUCOSEU NEGATIVE 03/02/2017 2245   HGBUR NEGATIVE 03/02/2017 2245   BILIRUBINUR NEGATIVE 03/02/2017 2245   KETONESUR NEGATIVE 03/02/2017 2245   PROTEINUR NEGATIVE 03/02/2017 2245   UROBILINOGEN 1.0 06/16/2015 1545   NITRITE NEGATIVE 03/02/2017 2245   LEUKOCYTESUR NEGATIVE 03/02/2017 2245   Sepsis Labs: @LABRCNTIP (procalcitonin:4,lacticidven:4)  )No results found for this or any previous visit (from the past 240 hour(s)).    Radiology Studies: No results found.   Scheduled Meds: . benzonatate  200 mg Oral TID  . enoxaparin (LOVENOX) injection  40 mg  Subcutaneous QHS  . famotidine  20 mg Oral BID  . lactose free nutrition  237 mL Oral TID WC  . levofloxacin  750 mg Oral Q48H  . losartan  100 mg Oral Daily  . pantoprazole  40 mg Oral Daily  . tiotropium  18 mcg Inhalation Daily   Continuous Infusions:   LOS: 6 days   Time Spent in minutes   35 minutes  Tache Bobst D.O. on 03/18/2017 at 1:48 PM  Between 7am to 7pm - Pager - [phone]  After 7pm go to [email or web address] - password [email or web address]  And look for the night coverage person covering for me after hours  [facility] Office  [phone]

## 2017-03-19 ENCOUNTER — Inpatient Hospital Stay (HOSPITAL_COMMUNITY): Payer: Medicare Other

## 2017-03-19 DIAGNOSIS — K5900 Constipation, unspecified: Secondary | ICD-10-CM

## 2017-03-19 LAB — GLUCOSE, CAPILLARY
GLUCOSE-CAPILLARY: 83 mg/dL (ref 65–99)
GLUCOSE-CAPILLARY: 94 mg/dL (ref 65–99)
GLUCOSE-CAPILLARY: 90 mg/dL (ref 65–99)
Glucose-Capillary: 132 mg/dL — ABNORMAL HIGH (ref 65–99)

## 2017-03-19 LAB — BASIC METABOLIC PANEL
ANION GAP: 4 — AB (ref 5–15)
BUN: 30 mg/dL — AB (ref 6–20)
CO2: 32 mmol/L (ref 22–32)
Calcium: 8.2 mg/dL — ABNORMAL LOW (ref 8.9–10.3)
Chloride: 104 mmol/L (ref 101–111)
Creatinine, Ser: 1.24 mg/dL — ABNORMAL HIGH (ref 0.44–1.00)
GFR, EST AFRICAN AMERICAN: 50 mL/min — AB (ref >60)
GFR, EST NON AFRICAN AMERICAN: 43 mL/min — AB (ref >60)
Glucose, Bld: 99 mg/dL (ref 65–99)
POTASSIUM: 4.5 mmol/L (ref 3.5–5.1)
SODIUM: 140 mmol/L (ref 135–145)

## 2017-03-19 LAB — CBC
HCT: 41.8 % (ref 36.0–46.0)
Hemoglobin: 13.5 g/dL (ref 12.0–15.0)
MCH: 28.7 pg (ref 26.0–34.0)
MCHC: 32.3 g/dL (ref 30.0–36.0)
MCV: 88.7 fL (ref 78.0–100.0)
PLATELETS: 193 10*3/uL (ref 150–400)
RBC: 4.71 MIL/uL (ref 3.87–5.11)
RDW: 14.7 % (ref 11.5–15.5)
WBC: 11.2 10*3/uL — AB (ref 4.0–10.5)

## 2017-03-19 MED ORDER — DM-GUAIFENESIN ER 30-600 MG PO TB12
2.0000 | ORAL_TABLET | Freq: Two times a day (BID) | ORAL | Status: DC
  Administered 2017-03-19 – 2017-03-21 (×5): 2 via ORAL
  Filled 2017-03-19 (×5): qty 2

## 2017-03-19 MED ORDER — FLUTICASONE PROPIONATE 50 MCG/ACT NA SUSP
1.0000 | Freq: Two times a day (BID) | NASAL | Status: DC
  Administered 2017-03-19 – 2017-03-21 (×4): 1 via NASAL
  Filled 2017-03-19: qty 16

## 2017-03-19 MED ORDER — BISACODYL 5 MG PO TBEC
5.0000 mg | DELAYED_RELEASE_TABLET | Freq: Every day | ORAL | Status: DC | PRN
  Administered 2017-03-19: 5 mg via ORAL
  Filled 2017-03-19 (×3): qty 1

## 2017-03-19 NOTE — Progress Notes (Signed)
Date:  March 19, 2017  Chart reviewed for concurrent status and case management needs.  Will continue to follow patient progress.  Discharge Planning: following for needs  Expected discharge date: [id number]  Leonarda Leis, BSN, RN3, CCM   [phone]  

## 2017-03-19 NOTE — Progress Notes (Signed)
OT Cancellation Note  Patient Details Name: Victoria Bird MRN: [id number] DOB: Aug 20, 1946   Cancelled Treatment:    Reason Eval/Treat Not Completed: Other (comment); Pt sleeping in bed, reports n/v last night and throughout today. Politely asking OT to return at a later time. Will check back as schedule permits.  Lou Cal, OT Pager [phone] 03/19/2017   Victoria Bird 03/19/2017, 4:11 PM

## 2017-03-19 NOTE — Progress Notes (Signed)
PT Cancellation Note  Patient Details Name: Victoria Bird MRN: [id number] DOB: Feb 27, 1946   Cancelled Treatment:    Reason Eval/Treat Not Completed: Attemtped PT tx session. Pt declined to participate on today. C/o dizziness, nausea, and abdominal pain. Pt requested PT check back another day.    Weston Anna, MPT Pager: [phone]

## 2017-03-19 NOTE — Progress Notes (Signed)
PROGRESS NOTE    Victoria Bird  ZOX:[id number] DOB: 1946/01/22 DOA: 03/12/2017 PCP: Hoyt Koch, MD   Chief Complaint  Patient presents with  . Shortness of Breath  . Cough    Brief Narrative:  HPI on 03/12/2017 by Dr. Jennette Kettle Victoria Bird is a 71 y.o. female with medical history significant of COPD, HTN, ?DM (no meds), cataracts.  Patient was actually supposed to have eye surgery today for cataract in eye but this was canceled.  Patient presents to the ED with several day history of SOB, wheezing, cough.  She has been unable to smoke for past 2 days due to severity of SOB.  Of note patient was admitted to hospital for observation for vertigo last week.  Interim history Very slow improvement in patient's cough and breathing.   Assessment & Plan   Acute respiratory failure with hypoxia/ Acute COPD exacerbation -Continue levaquin (renally adjusted), supplemental oxygen to maintain O2 sats > 90%, antitussives -Chest x-ray showed chronic interstitial coarsening, no acute cardiopulmonary findings -Currently on IV solumedrol, weaned. Will discontinue solumedrol -Refuses prednisone. She was told she is losing her vision secondary to chronic prednisone use. -Continue Spiriva, pulmicort nebs -on 03/14/17, Discussed with Dr. Ashok Cordia, pulmonology, suggested pulmicort 0.5 neb BID, possibly brovana -Feels tessalon has helped her cough, however continues to cough and has not used robitussin.  -Added Hycodan to help with patient's cough however patient has not been requesting this medication she does not like cough syrups. Explained to patient there are not many more options and trying to help her relieve her cough and this may actually provide her some relief so she can rest. -O2 sats monitored with and without oxygen at rest and ambulation with physical therapy, O2 sats decreased to 79% on room air with ambulation. -Repeated CXR, unchanged from prior, no infiltrate  noted -Adding mucinex -increased flonase to twice daily  Diabetes mellitus, type II -Patient refuses to be on insulin sliding scale -on metformin at home  Palmar erythema -Unknown etiology -Possibly secondary to hemochromatosis (as Hb was 15.2) versus COPD -Feels erythema mildly improving -Anemia panel, reviewed and within normal limits -Improved with Sarna  Essential hypertension -Patient takes losartan/HCTZ at home -continue losartan -Continue IV hydralazine PRN  Chronic kidney stage III -Creatinine appears to be at baseline, continue to monitor BMP  Leukocytosis -Likely secondary to steroids -Resolved  Tobacco abuse -Discussed smoking cessation  GERD -Continue H2 Blocker and continue PPI  Deconditioning -Likely secondary to patient's comorbid conditions -PT and OT consulted, initially recommending SNF, rolling walker, 3in1 bedside commode- given progress -Patient wanting to go home with home health  Constipation -Added dulcolax   DVT Prophylaxis  Lovenox  Code Status: Full  Family Communication: none at bedside  Disposition Plan: Admitted. Dispo TBD  Consultants Pulmonology, Dr. Ashok Cordia, via phone  Procedures  None  Antibiotics   Anti-infectives    Start     Dose/Rate Route Frequency Ordered Stop   03/12/17 2200  levofloxacin (LEVAQUIN) tablet 750 mg  Status:  Discontinued     750 mg Oral Every 48 hours 03/12/17 2126 03/19/17 0904   03/12/17 2115  levofloxacin (LEVAQUIN) tablet 750 mg  Status:  Discontinued     750 mg Oral Daily 03/12/17 2108 03/12/17 2125      Subjective:   Victoria Bird seen and examined today.  Feels breathing has improved, but not at baseline. Feels coughing has not improved and she is unable to get anything up. Complains of constipation. Denies  chest pain, abdominal pain, nausea, vomiting. Feels tired.  Objective:   Vitals:   03/19/17 0508 03/19/17 0856 03/19/17 0859 03/19/17 1411  BP: (!) 164/68   137/66  Pulse: 67    66  Resp: 15     Temp: 98.1 F (36.7 C)   97.9 F (36.6 C)  TempSrc: Oral   Oral  SpO2: 92% 98% 98% 94%  Weight:      Height:       No intake or output data in the 24 hours ending 03/19/17 1456 Filed Weights   03/12/17 1431  Weight: 71.7 kg (158 lb)   Exam  General: Well developed, Chronically illappearing, elderly, no distress  HEENT: NCAT, mucous membranes moist.   Cardiovascular: S1 S2 auscultated, no rubs, murmurs or gallops. Regular rate and rhythm.  Respiratory: Diminished breath sounds, mild diffuse exp wheezing, cough  Abdomen: Soft, nontender, nondistended, + bowel sounds  Extremities: warm dry without cyanosis clubbing or edema  Neuro: AAOx3, nonfocal  Psych: Appropriate mood and affect  Data Reviewed: I have personally reviewed following labs and imaging studies  CBC:  Recent Labs Lab 03/12/17 1548 03/14/17 0821 03/15/17 0751 03/19/17 0538  WBC 8.8 13.2* 10.2 11.2*  NEUTROABS 5.2  --   --   --   HGB 15.2* 14.7 14.7 13.5  HCT 45.4 44.5 46.1* 41.8  MCV 87.0 86.2 89.3 88.7  PLT 199 244 221 259   Basic Metabolic Panel:  Recent Labs Lab 03/14/17 0821 03/15/17 0751 03/16/17 0534 03/18/17 0715 03/19/17 0538  NA 138 141 141 141 140  K 4.6 4.1 3.8 4.0 4.5  CL 102 102 104 106 104  CO2 27 31 31 28  32  GLUCOSE 171* 99 155* 142* 99  BUN 42* 44* 38* 37* 30*  CREATININE 1.44* 1.48* 1.35* 1.27* 1.24*  CALCIUM 9.2 8.9 8.8* 8.4* 8.2*   GFR: Estimated Creatinine Clearance: 40.1 mL/min (A) (by C-G formula based on SCr of 1.24 mg/dL (H)). Liver Function Tests:  Recent Labs Lab 03/12/17 1548  AST 20  ALT 20  ALKPHOS 73  BILITOT 0.9  PROT 6.9  ALBUMIN 3.7   No results for input(s): LIPASE, AMYLASE in the last 168 hours. No results for input(s): AMMONIA in the last 168 hours. Coagulation Profile: No results for input(s): INR, PROTIME in the last 168 hours. Cardiac Enzymes: No results for input(s): CKTOTAL, CKMB, CKMBINDEX, TROPONINI in the  last 168 hours. BNP (last 3 results) No results for input(s): PROBNP in the last 8760 hours. HbA1C: No results for input(s): HGBA1C in the last 72 hours. CBG:  Recent Labs Lab 03/18/17 1133 03/18/17 1722 03/18/17 2012 03/19/17 0736 03/19/17 1138  GLUCAP 144* 158* 154* 83 94   Lipid Profile: No results for input(s): CHOL, HDL, LDLCALC, TRIG, CHOLHDL, LDLDIRECT in the last 72 hours. Thyroid Function Tests: No results for input(s): TSH, T4TOTAL, FREET4, T3FREE, THYROIDAB in the last 72 hours. Anemia Panel: No results for input(s): VITAMINB12, FOLATE, FERRITIN, TIBC, IRON, RETICCTPCT in the last 72 hours. Urine analysis:    Component Value Date/Time   COLORURINE YELLOW 03/02/2017 2245   APPEARANCEUR HAZY (A) 03/02/2017 2245   LABSPEC 1.008 03/02/2017 2245   PHURINE 5.0 03/02/2017 2245   GLUCOSEU NEGATIVE 03/02/2017 2245   HGBUR NEGATIVE 03/02/2017 2245   BILIRUBINUR NEGATIVE 03/02/2017 2245   KETONESUR NEGATIVE 03/02/2017 2245   PROTEINUR NEGATIVE 03/02/2017 2245   UROBILINOGEN 1.0 06/16/2015 1545   NITRITE NEGATIVE 03/02/2017 2245   LEUKOCYTESUR NEGATIVE 03/02/2017 2245   Sepsis  Labs: @LABRCNTIP (procalcitonin:4,lacticidven:4)  )No results found for this or any previous visit (from the past 240 hour(s)).    Radiology Studies: Dg Chest 2 View  Result Date: 03/18/2017 CLINICAL DATA:  Deep cough - hx COPD - hx emphysema - hx hypertension - current smoker EXAM: CHEST  2 VIEW COMPARISON:  03/12/2017 FINDINGS: Heart size is normal. Lungs are free of focal consolidations and pleural effusions. No pulmonary edema. Mild bronchitic changes are probably chronic. IMPRESSION: No evidence for acute  abnormality. Electronically Signed   By: Nolon Nations M.D.   On: 03/18/2017 14:13   Dg Abd 2 Views  Result Date: 03/19/2017 CLINICAL DATA:  Constipation. EXAM: ABDOMEN - 2 VIEW COMPARISON:  CT 12/16/2016. FINDINGS: Surgical clips right upper quadrant. Our amount of stool noted  throughout the colon and rectum suggesting constipation. No bowel distention or free air. Visceral and aortoiliac atherosclerotic vascular calcification noted. Mild prominence of the perispinal soft tissues of the lower thoracic/ upper lumbar spine is stable and consistent with prominent fat as noted on prior CT. Degenerative changes thoracolumbar spine. IMPRESSION: 1. Large amount of stool noted throughout the colon and rectum consistent with constipation. No bowel distention . No acute abnormality identified. 2. Aortoiliac and visceral atherosclerotic vascular disease . Electronically Signed   By: [place]   On: 03/19/2017 10:10     Scheduled Meds: . benzonatate  200 mg Oral TID  . budesonide (PULMICORT) nebulizer solution  0.25 mg Nebulization BID  . dextromethorphan-guaiFENesin  2 tablet Oral BID  . enoxaparin (LOVENOX) injection  40 mg Subcutaneous QHS  . famotidine  20 mg Oral BID  . fluticasone  1 spray Each Nare BID  . lactose free nutrition  237 mL Oral TID WC  . losartan  100 mg Oral Daily  . pantoprazole  40 mg Oral Daily  . tiotropium  18 mcg Inhalation Daily   Continuous Infusions:   LOS: 7 days   Time Spent in minutes   35 minutes  Jionni Helming D.O. on 03/19/2017 at 2:56 PM  Between 7am to 7pm - Pager - [phone]  After 7pm go to [email or web address] - password [email or web address]  And look for the night coverage person covering for me after hours  [facility] Office  [phone]

## 2017-03-20 ENCOUNTER — Inpatient Hospital Stay (HOSPITAL_COMMUNITY): Payer: Medicare Other

## 2017-03-20 DIAGNOSIS — R109 Unspecified abdominal pain: Secondary | ICD-10-CM

## 2017-03-20 DIAGNOSIS — R42 Dizziness and giddiness: Secondary | ICD-10-CM

## 2017-03-20 DIAGNOSIS — R110 Nausea: Secondary | ICD-10-CM

## 2017-03-20 LAB — GLUCOSE, CAPILLARY
GLUCOSE-CAPILLARY: 90 mg/dL (ref 65–99)
Glucose-Capillary: 95 mg/dL (ref 65–99)
Glucose-Capillary: 92 mg/dL (ref 65–99)
Glucose-Capillary: 162 mg/dL — ABNORMAL HIGH (ref 65–99)

## 2017-03-20 LAB — COMPREHENSIVE METABOLIC PANEL
ALBUMIN: 3.1 g/dL — AB (ref 3.5–5.0)
ALK PHOS: 56 U/L (ref 38–126)
ALT: 66 U/L — ABNORMAL HIGH (ref 14–54)
ANION GAP: 6 (ref 5–15)
AST: 46 U/L — ABNORMAL HIGH (ref 15–41)
BILIRUBIN TOTAL: 0.7 mg/dL (ref 0.3–1.2)
BUN: 30 mg/dL — AB (ref 6–20)
CO2: 31 mmol/L (ref 22–32)
Calcium: 8.3 mg/dL — ABNORMAL LOW (ref 8.9–10.3)
Chloride: 101 mmol/L (ref 101–111)
Creatinine, Ser: 1.23 mg/dL — ABNORMAL HIGH (ref 0.44–1.00)
GFR calc Af Amer: 50 mL/min — ABNORMAL LOW (ref >60)
GFR calc non Af Amer: 43 mL/min — ABNORMAL LOW (ref >60)
GLUCOSE: 171 mg/dL — AB (ref 65–99)
POTASSIUM: 4 mmol/L (ref 3.5–5.1)
Sodium: 138 mmol/L (ref 135–145)
TOTAL PROTEIN: 5.5 g/dL — AB (ref 6.5–8.1)

## 2017-03-20 LAB — LIPASE, BLOOD: LIPASE: 54 U/L — AB (ref 11–51)

## 2017-03-20 MED ORDER — MECLIZINE HCL 25 MG PO TABS: 12.5000 mg | ORAL_TABLET | Freq: Three times a day (TID) | ORAL | Status: DC | PRN

## 2017-03-20 MED ORDER — BISACODYL 10 MG RE SUPP
10.0000 mg | Freq: Once | RECTAL | Status: AC
Start: 2017-03-20 — End: 2017-03-20
  Administered 2017-03-20: 10 mg via RECTAL
  Filled 2017-03-20: qty 1

## 2017-03-20 MED ORDER — SODIUM CHLORIDE 0.9 % IV SOLN
INTRAVENOUS | Status: DC
  Administered 2017-03-20 – 2017-03-21 (×2): via INTRAVENOUS

## 2017-03-20 NOTE — Progress Notes (Signed)
PT Cancellation Note  Patient Details Name: Victoria Bird MRN: [id number] DOB: 08/16/1946   Cancelled Treatment:    Reason Eval/Treat Not Completed: Patient declined, no reason specified (walked wtih nursing earlier, c/o fatigue and vertigo)   [facility] 03/20/2017, 12:04 PM

## 2017-03-20 NOTE — Progress Notes (Signed)
Occupational Therapy Vestibular Evaluation (partially completed) Patient Details Name: Victoria Bird MRN: [id number] DOB: Feb 09, 1946 Today's Date: 03/20/2017    History of present illness 71 yo female admitted with COPD exac. Hx of vertigo, DM, COPD, chronic pain, fibromyalgia.    OT comments  Began vestibular evaluation. Pt did not show nystagmus for BPPV in any canal, but she did report feeling a little nauseous.  Continued with several other tests, but did not complete evaluation this session.  Will return tomorrow to continue/complete vestibular evaluation  Follow Up Recommendations  SNF    Equipment Recommendations  3 in 1 bedside commode    Recommendations for Other Services      Precautions / Restrictions Precautions Precautions: Fall Restrictions Weight Bearing Restrictions: No       Mobility Bed Mobility                  Transfers                      Balance                                           ADL either performed or assessed with clinical judgement   ADL                                               Vision       Perception     Praxis      Cognition Arousal/Alertness: Awake/alert Behavior During Therapy: WFL for tasks assessed/performed Overall Cognitive Status: Within Functional Limits for tasks assessed                                          Exercises     Shoulder Instructions       General Comments Pt seen for vestibular assessment.  She describes dizzy sensation as feeling off balance, like the room moves and nausea. She reports that nausea is her first sign.  She states that sometimes she feels lightheaded also.  It occurs mostly when she moves her head and can happen in bed or standing.  Performed modified Marye Round for BPPV, did not see nystagmus.  Pt reports she was a little nauseous. Also tested for horizontal canal BPPV, and this was negative.  VOR  was WFLs; eyes remained on therapist. She reports some blurriness and she was scheduled to have cataract sx when she was admitted here.  HT WFLs.  Pt fatiqued and I gave her a rest.  Returned at 14:38, but she is still fatiqued.  Will return tomorrow to finish eval    Pertinent Vitals/ Pain       Pain Assessment: Faces Faces Pain Scale: Hurts a little bit Pain Location: R side of neck Pain Descriptors / Indicators: Sore Pain Intervention(s): Limited activity within patient's tolerance;Monitored during session;Repositioned  Home Living                                          Prior Functioning/Environment  Frequency           Progress Toward Goals  OT Goals(current goals can now be found in the care plan section)  Progress towards OT goals: Progressing toward goals  Acute Rehab OT Goals Patient Stated Goal: to get better Time For Goal Achievement: 03/27/17  Plan Discharge plan remains appropriate    Co-evaluation                 AM-PAC PT "6 Clicks" Daily Activity     Outcome Measure   Help from another person eating meals?: None Help from another person taking care of personal grooming?: A Little Help from another person toileting, which includes using toliet, bedpan, or urinal?: A Little Help from another person bathing (including washing, rinsing, drying)?: A Little Help from another person to put on and taking off regular upper body clothing?: A Little Help from another person to put on and taking off regular lower body clothing?: A Little 6 Click Score: 19    End of Session    OT Visit Diagnosis: Unsteadiness on feet (R26.81);Muscle weakness (generalized) (M62.81)   Activity Tolerance Patient limited by fatigue   Patient Left in bed;with call bell/phone within reach;with bed alarm set   Nurse Communication          Time: 1540-0867 OT Time Calculation (min): 24 min  Charges: OT General Charges $OT Visit: 1  Procedure OT Treatments $Therapeutic Activity: 23-37 mins  Victoria Bird, OTR/L [phone] 03/20/2017   Victoria Bird 03/20/2017, 2:42 PM

## 2017-03-20 NOTE — Consult Note (Addendum)
   [facility] Inpatient Consult   03/20/2017  EMME ROSENAU 09-Oct-1945 [id number]    [facility] Care Management follow up.  Went to bedside again to speak with Ms. Hollen about [place] Management program. Explained and offered [facility] services. She declines by stating "honey, insurance already has someone coming to the house, I do not need any one else".  Made her aware that she can contact [place] Management office if she should change her mind in the future. [name] Care Management brochure and contact information was provided.   Made inpatient RNCM aware patient declined [place] Management services.  Marthenia Rolling, MSN-Ed, RN,BSN [facility] Liaison [phone]

## 2017-03-20 NOTE — Care Management Important Message (Signed)
Important Message  Patient Details  Name: Victoria Bird MRN: [id number] Date of Birth: September 28, 1946   Medicare Important Message Given:  Yes    Kerin Salen 03/20/2017, 11:49 AMImportant Message  Patient Details  Name: Victoria Bird MRN: [id number] Date of Birth: 02-26-1946   Medicare Important Message Given:  Yes    Kerin Salen 03/20/2017, 11:49 AM

## 2017-03-20 NOTE — Progress Notes (Signed)
SATURATION QUALIFICATIONS: (This note is used to comply with regulatory documentation for home oxygen)  Patient Saturations on Room Air at Rest = 94%  Patient Saturations on Room Air while Ambulating = 93%  Patient Saturations on 1 Liters of oxygen while Ambulating = 100%  Please briefly explain why patient needs home oxygen: no needs

## 2017-03-20 NOTE — Progress Notes (Signed)
Nutrition Follow-up  INTERVENTION:   Continue Boost Plus chocolate TID- Each supplement provides 360kcal and 14g protein.   Encourage PO intake RD to continue to monitor  NUTRITION DIAGNOSIS:   Increased nutrient needs related to  (COPD) as evidenced by estimated needs.  Ongoing.  GOAL:   Patient will meet greater than or equal to 90% of their needs  Progressing.  MONITOR:   PO intake, Supplement acceptance, Labs, Weight trends, I & O's  ASSESSMENT:   71 y.o. female with medical history significant of COPD, HTN, ?DM (no meds), cataracts.  Patient was actually supposed to have eye surgery today for cataract in eye but this was canceled.  Patient presents to the ED with several day history of SOB, wheezing, cough.  She has been unable to smoke for past 2 days due to severity of SOB.  Of note patient was admitted to hospital for observation for vertigo last week.  Patient in room with no family at bedside. Pt resting and states she ate a pretty good lunch of fish, baked potato and slaw. States she has a little nausea but it is controlled at this time. Pt is still drinking her Boost drinks, encouraged her to continue supplements as long as she is tolerating them.  Medications: Protonix tablet daily, IV Zofran PRN Labs reviewed: GFR: 43  Diet Order:  Diet regular Room service appropriate? Yes; Fluid consistency: Thin  Skin:  Reviewed, no issues  Last BM:  6/19  Height:   Ht Readings from Last 1 Encounters:  03/12/17 5\' 3"  (1.6 m)    Weight:   Wt Readings from Last 1 Encounters:  03/12/17 158 lb (71.7 kg)    Ideal Body Weight:  52.3 kg  BMI:  Body mass index is 27.99 kg/m.  Estimated Nutritional Needs:   Kcal:  1700-1900  Protein:  75-85g  Fluid:  1.7L/day  EDUCATION NEEDS:   No education needs identified at this time  Clayton Bibles, MS, RD, LDN Pager: [phone] After Hours Pager: [phone]

## 2017-03-20 NOTE — Consult Note (Addendum)
   [facility] CM Inpatient Consult   03/20/2017  Victoria Bird Sep 14, 1946 [id number]    Patient screened for potential [facility] Care Management program services.  Went to bedside to discuss [place] Management. However, patient was sleeping soundly and did not awake to name being called.  [name] Care Management brochure with contact information.   Marthenia Rolling, MSN-Ed, RN,BSN [facility] Liaison [phone]

## 2017-03-20 NOTE — Progress Notes (Addendum)
PROGRESS NOTE    Victoria Bird  VEL:[id number] DOB: Nov 05, 1945 DOA: 03/12/2017 PCP: Hoyt Koch, MD   Chief Complaint  Patient presents with  . Shortness of Breath  . Cough    Brief Narrative:  HPI on 03/12/2017 by Dr. Jennette Kettle Victoria Bird is a 71 y.o. female with medical history significant of COPD, HTN, ?DM (no meds), cataracts.  Patient was actually supposed to have eye surgery today for cataract in eye but this was canceled.  Patient presents to the ED with several day history of SOB, wheezing, cough.  She has been unable to smoke for past 2 days due to severity of SOB.  Of note patient was admitted to hospital for observation for vertigo last week.  Interim history Very slow improvement in patient's cough and breathing.   Assessment & Plan   Acute respiratory failure with hypoxia/ Acute COPD exacerbation -Continue levaquin (renally adjusted), supplemental oxygen to maintain O2 sats > 90%, antitussives -Chest x-ray showed chronic interstitial coarsening, no acute cardiopulmonary findings -Currently on IV solumedrol, weaned. Will discontinue solumedrol -Refuses prednisone. She was told she is losing her vision secondary to chronic prednisone use. -Continue Spiriva, pulmicort nebs, mucinex, flonase, hycodan -on 03/14/17, Discussed with Dr. Ashok Cordia, pulmonology, suggested pulmicort 0.5 neb BID, possibly brovana -Feels tessalon has helped her cough, however continues to cough and has not used robitussin.  -Repeated CXR, unchanged from prior, no infiltrate noted -O2 sats tested again, patient maintained oxygen saturations at 93 while ambulating on room air.  Nausea -possibly related to constipation -Will obtain CMP and lipase level, Abd [place] -Monitor closely  -Will start gentle IVF  Dizziness/Possible Vertigo -Ordered PT vestibular  -OT assessed patient and will continue to work with patient -Ordered meclizine PRN  Diabetes mellitus, type II -Patient  refuses to be on insulin sliding scale -on metformin at home  Palmar erythema -Unknown etiology -Possibly secondary to hemochromatosis (as Hb was 15.2) versus COPD -Feels erythema mildly improving -Anemia panel, reviewed and within normal limits -Improved with Sarna  Essential hypertension -Patient takes losartan/HCTZ at home -continue losartan -Continue IV hydralazine PRN  Chronic kidney stage III -Creatinine appears to be at baseline, continue to monitor BMP  Leukocytosis -Likely secondary to steroids -Resolved  Tobacco abuse -Discussed smoking cessation  GERD -Continue H2 Blocker and continue PPI  Deconditioning -Likely secondary to patient's comorbid conditions -PT and OT consulted, initially recommending SNF, rolling walker, 3in1 bedside commode- given progress -Patient wanting to go home with home health  Constipation -Obtained xray: showed large stool burden -Continue dulcolax, will order suppository   DVT Prophylaxis  Lovenox  Code Status: Full  Family Communication: none at bedside  Disposition Plan: Admitted. Dispo TBD- probably home within the next 1-2 days  Consultants Pulmonology, Dr. Ashok Cordia, via phone  Procedures  None  Antibiotics   Anti-infectives    Start     Dose/Rate Route Frequency Ordered Stop   03/12/17 2200  levofloxacin (LEVAQUIN) tablet 750 mg  Status:  Discontinued     750 mg Oral Every 48 hours 03/12/17 2126 03/19/17 0904   03/12/17 2115  levofloxacin (LEVAQUIN) tablet 750 mg  Status:  Discontinued     750 mg Oral Daily 03/12/17 2108 03/12/17 2125      Subjective:   Victoria Bird seen and examined today.  Feels breathing has improved as well as cough. Now complains of nausea and dizziness. Also continues to point of constipation. Denies chest pain or headache.   Objective:   Vitals:  03/20/17 0603 03/20/17 0911 03/20/17 1058 03/20/17 1332  BP: (!) 146/56 (!) 150/52  (!) 132/52  Pulse: 60 79  73  Resp: 18 18  18     Temp: 98.3 F (36.8 C) 98.1 F (36.7 C)  98.3 F (36.8 C)  TempSrc: Oral Oral  Oral  SpO2:  96% 92% 95%  Weight:      Height:        Intake/Output Summary (Last 24 hours) at 03/20/17 1503 Last data filed at 03/20/17 0830  Gross per 24 hour  Intake              440 ml  Output                0 ml  Net              440 ml   Filed Weights   03/12/17 1431  Weight: 71.7 kg (158 lb)   Exam  General: Well developed, chronically ill appearing, NAD  HEENT: NCAT, mucous membranes moist.   Cardiovascular: S1 S2 auscultated, RRR, no murmurs  Respiratory: Diminished breath sounds, mild exp wheezing, minimal cough  Abdomen: Soft, nontender, nondistended, + bowel sounds  Extremities: warm dry without cyanosis clubbing or edema  Neuro: AAOx3, nonfocal. Gait seen, patient unsteady  Psych: Appropriate  Data Reviewed: I have personally reviewed following labs and imaging studies  CBC:  Recent Labs Lab 03/14/17 0821 03/15/17 0751 03/19/17 0538  WBC 13.2* 10.2 11.2*  HGB 14.7 14.7 13.5  HCT 44.5 46.1* 41.8  MCV 86.2 89.3 88.7  PLT 244 221 335   Basic Metabolic Panel:  Recent Labs Lab 03/15/17 0751 03/16/17 0534 03/18/17 0715 03/19/17 0538 03/20/17 0852  NA 141 141 141 140 138  K 4.1 3.8 4.0 4.5 4.0  CL 102 104 106 104 101  CO2 31 31 28  32 31  GLUCOSE 99 155* 142* 99 171*  BUN 44* 38* 37* 30* 30*  CREATININE 1.48* 1.35* 1.27* 1.24* 1.23*  CALCIUM 8.9 8.8* 8.4* 8.2* 8.3*   GFR: Estimated Creatinine Clearance: 40.4 mL/min (A) (by C-G formula based on SCr of 1.23 mg/dL (H)). Liver Function Tests:  Recent Labs Lab 03/20/17 0852  AST 46*  ALT 66*  ALKPHOS 56  BILITOT 0.7  PROT 5.5*  ALBUMIN 3.1*    Recent Labs Lab 03/20/17 0852  LIPASE 54*   No results for input(s): AMMONIA in the last 168 hours. Coagulation Profile: No results for input(s): INR, PROTIME in the last 168 hours. Cardiac Enzymes: No results for input(s): CKTOTAL, CKMB, CKMBINDEX,  TROPONINI in the last 168 hours. BNP (last 3 results) No results for input(s): PROBNP in the last 8760 hours. HbA1C: No results for input(s): HGBA1C in the last 72 hours. CBG:  Recent Labs Lab 03/19/17 1138 03/19/17 1635 03/19/17 2117 03/20/17 0722 03/20/17 1156  GLUCAP 94 90 132* 95 92   Lipid Profile: No results for input(s): CHOL, HDL, LDLCALC, TRIG, CHOLHDL, LDLDIRECT in the last 72 hours. Thyroid Function Tests: No results for input(s): TSH, T4TOTAL, FREET4, T3FREE, THYROIDAB in the last 72 hours. Anemia Panel: No results for input(s): VITAMINB12, FOLATE, FERRITIN, TIBC, IRON, RETICCTPCT in the last 72 hours. Urine analysis:    Component Value Date/Time   COLORURINE YELLOW 03/02/2017 2245   APPEARANCEUR HAZY (A) 03/02/2017 2245   LABSPEC 1.008 03/02/2017 2245   PHURINE 5.0 03/02/2017 2245   GLUCOSEU NEGATIVE 03/02/2017 2245   HGBUR NEGATIVE 03/02/2017 2245   BILIRUBINUR NEGATIVE 03/02/2017 2245   KETONESUR NEGATIVE  03/02/2017 2245   PROTEINUR NEGATIVE 03/02/2017 2245   UROBILINOGEN 1.0 06/16/2015 1545   NITRITE NEGATIVE 03/02/2017 2245   LEUKOCYTESUR NEGATIVE 03/02/2017 2245   Sepsis Labs: @LABRCNTIP (procalcitonin:4,lacticidven:4)  )No results found for this or any previous visit (from the past 240 hour(s)).    Radiology Studies: Dg Abd 2 Views  Result Date: 03/19/2017 CLINICAL DATA:  Constipation. EXAM: ABDOMEN - 2 VIEW COMPARISON:  CT 12/16/2016. FINDINGS: Surgical clips right upper quadrant. Our amount of stool noted throughout the colon and rectum suggesting constipation. No bowel distention or free air. Visceral and aortoiliac atherosclerotic vascular calcification noted. Mild prominence of the perispinal soft tissues of the lower thoracic/ upper lumbar spine is stable and consistent with prominent fat as noted on prior CT. Degenerative changes thoracolumbar spine. IMPRESSION: 1. Large amount of stool noted throughout the colon and rectum consistent with  constipation. No bowel distention . No acute abnormality identified. 2. Aortoiliac and visceral atherosclerotic vascular disease . Electronically Signed   By: [place]   On: 03/19/2017 10:10     Scheduled Meds: . benzonatate  200 mg Oral TID  . budesonide (PULMICORT) nebulizer solution  0.25 mg Nebulization BID  . dextromethorphan-guaiFENesin  2 tablet Oral BID  . enoxaparin (LOVENOX) injection  40 mg Subcutaneous QHS  . famotidine  20 mg Oral BID  . fluticasone  1 spray Each Nare BID  . lactose free nutrition  237 mL Oral TID WC  . losartan  100 mg Oral Daily  . pantoprazole  40 mg Oral Daily  . tiotropium  18 mcg Inhalation Daily   Continuous Infusions:   LOS: 8 days   Time Spent in minutes   35 minutes  Antoninette Lerner D.O. on 03/20/2017 at 3:03 PM  Between 7am to 7pm - Pager - [phone]  After 7pm go to [email or web address] - password [email or web address]  And look for the night coverage person covering for me after hours  [facility] Office  [phone]

## 2017-03-21 DIAGNOSIS — R7989 Other specified abnormal findings of blood chemistry: Secondary | ICD-10-CM

## 2017-03-21 LAB — COMPREHENSIVE METABOLIC PANEL
ALK PHOS: 57 U/L (ref 38–126)
ALT: 58 U/L — ABNORMAL HIGH (ref 14–54)
AST: 29 U/L (ref 15–41)
Albumin: 2.9 g/dL — ABNORMAL LOW (ref 3.5–5.0)
Anion gap: 4 — ABNORMAL LOW (ref 5–15)
BILIRUBIN TOTAL: 0.7 mg/dL (ref 0.3–1.2)
BUN: 26 mg/dL — ABNORMAL HIGH (ref 6–20)
CALCIUM: 8.1 mg/dL — AB (ref 8.9–10.3)
CO2: 31 mmol/L (ref 22–32)
Chloride: 104 mmol/L (ref 101–111)
Creatinine, Ser: 1.17 mg/dL — ABNORMAL HIGH (ref 0.44–1.00)
GFR, EST AFRICAN AMERICAN: 53 mL/min — AB (ref >60)
GFR, EST NON AFRICAN AMERICAN: 46 mL/min — AB (ref >60)
Glucose, Bld: 161 mg/dL — ABNORMAL HIGH (ref 65–99)
POTASSIUM: 4.2 mmol/L (ref 3.5–5.1)
Sodium: 139 mmol/L (ref 135–145)
TOTAL PROTEIN: 5.3 g/dL — AB (ref 6.5–8.1)

## 2017-03-21 LAB — CBC
HEMATOCRIT: 40.2 % (ref 36.0–46.0)
HEMOGLOBIN: 12.8 g/dL (ref 12.0–15.0)
MCH: 28.4 pg (ref 26.0–34.0)
MCHC: 31.8 g/dL (ref 30.0–36.0)
MCV: 89.1 fL (ref 78.0–100.0)
Platelets: 168 10*3/uL (ref 150–400)
RBC: 4.51 MIL/uL (ref 3.87–5.11)
RDW: 14.8 % (ref 11.5–15.5)
WBC: 8.8 10*3/uL (ref 4.0–10.5)

## 2017-03-21 LAB — LIPASE, BLOOD: LIPASE: 29 U/L (ref 11–51)

## 2017-03-21 LAB — GLUCOSE, CAPILLARY
GLUCOSE-CAPILLARY: 90 mg/dL (ref 65–99)
GLUCOSE-CAPILLARY: 114 mg/dL — AB (ref 65–99)
Glucose-Capillary: 126 mg/dL — ABNORMAL HIGH (ref 65–99)

## 2017-03-21 MED ORDER — FAMOTIDINE 20 MG PO TABS: 20.0000 mg | ORAL_TABLET | Freq: Two times a day (BID) | ORAL | 0 refills | Status: DC

## 2017-03-21 MED ORDER — BOOST PLUS PO LIQD: 237.0000 mL | Freq: Three times a day (TID) | ORAL | 0 refills | Status: DC

## 2017-03-21 MED ORDER — DM-GUAIFENESIN ER 30-600 MG PO TB12: 2.0000 | ORAL_TABLET | Freq: Two times a day (BID) | ORAL | 0 refills | Status: DC

## 2017-03-21 MED ORDER — PANTOPRAZOLE SODIUM 40 MG PO TBEC: 40.0000 mg | DELAYED_RELEASE_TABLET | Freq: Every day | ORAL | 0 refills | Status: DC

## 2017-03-21 MED ORDER — BUDESONIDE 0.25 MG/2ML IN SUSP: 0.2500 mg | Freq: Two times a day (BID) | RESPIRATORY_TRACT | 1 refills | Status: DC

## 2017-03-21 MED ORDER — FLUTICASONE PROPIONATE 50 MCG/ACT NA SUSP
1.0000 | Freq: Every day | NASAL | 0 refills | Status: DC
Start: 2017-03-21 — End: 2017-03-21

## 2017-03-21 MED ORDER — FLUTICASONE PROPIONATE 50 MCG/ACT NA SUSP: 1.0000 | Freq: Every day | NASAL | 0 refills | Status: DC

## 2017-03-21 MED ORDER — MECLIZINE HCL 12.5 MG PO TABS: 12.5000 mg | ORAL_TABLET | Freq: Four times a day (QID) | ORAL | 0 refills | Status: DC | PRN

## 2017-03-21 MED ORDER — BENZONATATE 200 MG PO CAPS: 200.0000 mg | ORAL_CAPSULE | Freq: Three times a day (TID) | ORAL | 0 refills | Status: DC

## 2017-03-21 MED ORDER — HYDROCODONE-ACETAMINOPHEN 5-325 MG PO TABS: 1.0000 | ORAL_TABLET | Freq: Four times a day (QID) | ORAL | 0 refills | Status: DC | PRN

## 2017-03-21 MED ORDER — HYDROCODONE-HOMATROPINE 5-1.5 MG/5ML PO SYRP: 5.0000 mL | ORAL_SOLUTION | ORAL | 0 refills | Status: DC | PRN

## 2017-03-21 MED ORDER — BUDESONIDE 0.25 MG/2ML IN SUSP
0.2500 mg | Freq: Two times a day (BID) | RESPIRATORY_TRACT | 1 refills | Status: DC
Start: 2017-03-21 — End: 2017-03-21

## 2017-03-21 NOTE — Progress Notes (Signed)
OT Cancellation Note  Patient Details Name: Victoria Bird MRN: [id number] DOB: 08-30-1946   Cancelled Treatment:    Reason Eval/Treat Not Completed: Other (comment).  Plan is for pt to return home today. She doesn't want to finish vestibular testing, stating "I'll be OK"  Gave her a couple of compensatory strategies to use if she gets spinning.  If possible, request that HHPT or HHOT have vestibular training.  Thanks.  Shabana Armentrout 03/21/2017, 1:25 PM  Lesle Chris, OTR/L [phone] 03/21/2017

## 2017-03-21 NOTE — Discharge Summary (Signed)
Physician Discharge Summary  Victoria Bird IFO:[id number] DOB: 08-Apr-1946 DOA: 03/12/2017  PCP: Hoyt Koch, MD  Admit date: 03/12/2017 Discharge date: 03/21/2017  Time spent: 45 minutes  Recommendations for Outpatient Follow-up:  Patient will be discharged to home with home health physical and occupational therapy.  Patient will need to follow up with primary care provider within one week of discharge, repeat CMP, discuss vascular referral.  Follow up with pulmonology, Dr. Ashok Cordia, in one week. You will need a repeat CT scan of her abdomen and pelvis in 6 months. Patient should continue medications as prescribed.  Patient should follow a carb modified/heart healthy diet.   Discharge Diagnoses:  Acute respiratory failure with hypoxia/ Acute COPD exacerbation Nausea Mildly elevated LFTs Aortic atherosclerosis, with aneurysm Dizziness/Possible Vertigo Diabetes mellitus, type II Palmar erythema Essential hypertension Chronic kidney stage III Leukocytosis Tobacco abuse GERD Deconditioning Constipation  Discharge Condition: Stable  Diet recommendation: Heart healthy/carb modified  Filed Weights   03/12/17 1431  Weight: 71.7 kg (158 lb)    History of present illness:  on 03/12/2017 by Dr. Rich Number Caudleis a 71 y.o.femalewith medical history significant of COPD, HTN, ?DM (no meds), cataracts. Patient was actually supposed to have eye surgery today for cataract in eye but this was canceled. Patient presents to the ED with several day history of SOB, wheezing, cough. She has been unable to smoke for past 2 days due to severity of SOB. Of note patient was admitted to hospital for observation for vertigo last week.  Hospital Course:  Acute respiratory failure with hypoxia/ Acute COPD exacerbation -Continue levaquin (renally adjusted), supplemental oxygen to maintain O2 sats > 90%, antitussives -Chest x-ray showed chronic interstitial coarsening, no  acute cardiopulmonary findings -Currently on IV solumedrol, weaned. Will discontinue solumedrol -Refuses prednisone. She was told she is losing her vision secondary to chronic prednisone use. -Continue Spiriva, pulmicort nebs, mucinex, flonase, hycodan -on 03/14/17, Discussed with Dr. Ashok Cordia, pulmonology, suggested pulmicort 0.5 neb BID, possibly brovana -Feels tessalon has helped her cough, however continues to cough and has not used robitussin.  -Repeated CXR, unchanged from prior, no infiltrate noted -O2 sats tested again, patient maintained oxygen saturations at 93 while ambulating on room air.  Nausea -possibly related to constipation -Lipase was mildly elevated, however resolved -LFTs mildly elevated, however, trending downward -Abdominal US: fatty liver  Mildly elevated LFTs -As above -Placed on IVF and improved -Repeat CMP in one week  Aortic atherosclerosis, with aneurysm -Noted on ultrasound, aneurysm 4.5 cm distally, recommended repeat CTA of abdomen and pelvis in 6 months and vascular surgery referral -Discussed with patient, she is aware of it. Also discussed smoking cessation.  Dizziness/Possible Vertigo -Ordered PT vestibular, recommended SNF, however, patient continues to refuse -Will discharge patient with HH PT and OT -Continue meclizine PRN -Of note, upon patient's last hospitalization, she did complain of vertigo which was attributed to a viral illness at that time. Patient was discharged with meclizine. However patient did not fill her prescription.  Diabetes mellitus, type II -Patient refuses to be on insulin sliding scale -on metformin at home  Palmar erythema -Unknown etiology -Possibly secondary to hemochromatosis (as Hb was 15.2) versus COPD -Feels erythema mildly improving -Anemia panel, reviewed and within normal limits -Improved with Sarna  Essential hypertension -Patient takes losartan/HCTZ at home -continue losartan -Continue IV  hydralazine PRN  Chronic kidney stage III -Creatinine appears to be at baseline, continue to monitor BMP  Leukocytosis -Likely secondary to steroids -Resolved  Tobacco abuse -  Discussed smoking cessation  GERD -Continue H2 Blocker and continue PPI  Deconditioning -Likely secondary to patient's comorbid conditions -PT and OT consulted, initially recommending SNF, rolling walker, 3in1 bedside commode -Patient wanting to go home with home health  Constipation -Obtained xray: showed large stool burden -Resolved with suppository  Consultants Pulmonology, Dr. Ashok Cordia, via phone  Procedures  None  Discharge Exam: Vitals:   03/20/17 2116 03/21/17 0534  BP: (!) 182/71 130/65  Pulse: 82 63  Resp: 18 18  Temp: 98 F (36.7 C) 97.9 F (36.6 C)   Continues to have some nausea and dizziness. Denies chest pain, abdominal pain, vomiting, diarrhea. Feels constipation has resolved. Feels cough and breathing are improving.   General: Well developed, chronically ill-appearing, no apparent distress  HEENT: NCAT,mucous membranes moist.  Cardiovascular: S1 S2 auscultated, no rubs, murmurs or gallops. Regular rate and rhythm.  Respiratory: Diminished breath sounds however clear. Minimal dry cough.  Abdomen: Soft, nontender, nondistended, + bowel sounds  Extremities: warm dry without cyanosis clubbing or edema  Neuro: AAOx3, nonfocal  Psych: Normal affect and demeanor with intact judgement and insight  Discharge Instructions Discharge Instructions    Discharge instructions    Complete by:  As directed    Patient will be discharged to home with home health physical and occupational therapy.  Patient will need to follow up with primary care provider within one week of discharge, repeat CMP, discuss vascular referral.  Follow up with pulmonology, Dr. Ashok Cordia, in one week. You will need a repeat CT scan of her abdomen and pelvis in 6 months. Patient should continue medications as  prescribed.  Patient should follow a carb modified/heart healthy diet.     Current Discharge Medication List    START taking these medications   Details  benzonatate (TESSALON) 200 MG capsule Take 1 capsule (200 mg total) by mouth 3 (three) times daily. Qty: 30 capsule, Refills: 0    budesonide (PULMICORT) 0.25 MG/2ML nebulizer solution Take 2 mLs (0.25 mg total) by nebulization 2 (two) times daily. Qty: 60 mL, Refills: 1    dextromethorphan-guaiFENesin (MUCINEX DM) 30-600 MG 12hr tablet Take 2 tablets by mouth 2 (two) times daily. Qty: 30 tablet, Refills: 0    famotidine (PEPCID) 20 MG tablet Take 1 tablet (20 mg total) by mouth 2 (two) times daily. Qty: 60 tablet, Refills: 0    fluticasone (FLONASE) 50 MCG/ACT nasal spray Place 1 spray into both nostrils daily. Qty: 16 g, Refills: 0    HYDROcodone-acetaminophen (NORCO/VICODIN) 5-325 MG tablet Take 1 tablet by mouth every 6 (six) hours as needed for moderate pain. Qty: 20 tablet, Refills: 0    HYDROcodone-homatropine (HYCODAN) 5-1.5 MG/5ML syrup Take 5 mLs by mouth every 4 (four) hours as needed for cough. Qty: 120 mL, Refills: 0    lactose free nutrition (BOOST PLUS) LIQD Take 237 mLs by mouth 3 (three) times daily with meals. Qty: 90 Can, Refills: 0      CONTINUE these medications which have CHANGED   Details  meclizine (ANTIVERT) 12.5 MG tablet Take 1 tablet (12.5 mg total) by mouth every 6 (six) hours as needed for dizziness or nausea. Qty: 30 tablet, Refills: 0    pantoprazole (PROTONIX) 40 MG tablet Take 1 tablet (40 mg total) by mouth daily. Qty: 30 tablet, Refills: 0      CONTINUE these medications which have NOT CHANGED   Details  albuterol (ACCUNEB) 0.63 MG/3ML nebulizer solution Take 1 ampule by nebulization every 6 (six) hours as needed  for wheezing or shortness of breath.    albuterol (PROAIR HFA) 108 (90 Base) MCG/ACT inhaler Inhale 2 puffs into the lungs every 6 (six) hours as needed for wheezing or  shortness of breath. Qty: 1 Inhaler, Refills: 5    BESIVANCE 0.6 % SUSP Place 1 drop into the right eye 3 (three) times daily.    DUREZOL 0.05 % EMUL Place 1 drop into the right eye 2 (two) times daily.    losartan-hydrochlorothiazide (HYZAAR) 100-12.5 MG tablet Take 1 tablet by mouth daily.     metFORMIN (GLUCOPHAGE) 500 MG tablet TAKE 1 TABLET(500 MG) BY MOUTH TWICE DAILY WITH A MEAL Qty: 90 tablet, Refills: 1   Associated Diagnoses: Diabetes mellitus due to underlying condition with hyperosmolarity without coma, without long-term current use of insulin ([name])    PROLENSA 0.07 % SOLN Place 1 drop into the right eye at bedtime.    SPIRIVA HANDIHALER 18 MCG inhalation capsule PLACE 1 CAPSULE INTO INHALER AND INHALE DAILY Qty: 30 capsule, Refills: 5    Spacer/Aero-Holding Chambers (AEROCHAMBER MV) inhaler Use as instructed Qty: 1 each, Refills: 0       Allergies  Allergen Reactions  . Aspartame And Phenylalanine Nausea And Vomiting    Patient says she is allergic to all artificial sweeteners  . Doxycycline Other (See Comments)    Nausea, vomiting, HA, double vision  . Tramadol Shortness Of Breath and Nausea Only  . Codeine Itching    Has not tried benadryl to alleviate side effects  . Neurontin [Gabapentin] Other (See Comments)    Dizzy, "drugged" feeling, sleepy  . Sulfa Antibiotics Other (See Comments)    Kidney problem   . Symbicort [Budesonide-Formoterol Fumarate]     Swelling of face and inside of mouth per pt  . Penicillins Rash    Has patient had a PCN reaction causing immediate rash, facial/tongue/throat swelling, SOB or lightheadedness with hypotension: No Has patient had a PCN reaction causing severe rash involving mucus membranes or skin necrosis: No Has patient had a PCN reaction that required hospitalization No Has patient had a PCN reaction occurring within the last 10 years: No If all of the above answers are "NO", then may proceed with Cephalosporin use.  .  Prednisone Rash    Patient stated she received Prednisone while she was in the hospital and experienced a rash and "extreme pain.'   Follow-up Information    Hoyt Koch, MD. Schedule an appointment as soon as possible for a visit in 1 week(s).   Specialty:  Internal Medicine Why:  Hospital follow-up Contact information: [place] [address] [phone]        Javier Glazier, MD. Schedule an appointment as soon as possible for a visit in 1 week(s).   Specialty:  Pulmonary Disease Why:  Hospital follow-up, COPD Contact information: [address] [place] [address] [phone]            The results of significant diagnostics from this hospitalization (including imaging, microbiology, ancillary and laboratory) are listed below for reference.    Significant Diagnostic Studies: Dg Chest 2 View  Result Date: 03/18/2017 CLINICAL DATA:  Deep cough - hx COPD - hx emphysema - hx hypertension - current smoker EXAM: CHEST  2 VIEW COMPARISON:  03/12/2017 FINDINGS: Heart size is normal. Lungs are free of focal consolidations and pleural effusions. No pulmonary edema. Mild bronchitic changes are probably chronic. IMPRESSION: No evidence for acute  abnormality. Electronically Signed  By: Nolon Nations M.D.   On: 03/18/2017 14:13   Ct Head Wo Contrast  Result Date: 03/03/2017 CLINICAL DATA:  Dizziness and emesis EXAM: CT HEAD WITHOUT CONTRAST TECHNIQUE: Contiguous axial images were obtained from the base of the skull through the vertex without intravenous contrast. COMPARISON:  1. Brain MRI 06/10/2016 2. Head CT 06/08/2016 FINDINGS: Brain: No mass lesion, intraparenchymal hemorrhage or extra-axial collection. No evidence of acute cortical infarct. Brain parenchyma and CSF-containing spaces are normal for age. Vascular: No hyperdense vessel or unexpected calcification. Skull: Normal visualized skull base, calvarium and extracranial soft  tissues. Sinuses/Orbits: No sinus fluid levels or advanced mucosal thickening. No mastoid effusion. Normal orbits. IMPRESSION: Normal head CT. Electronically Signed   By: Ulyses Jarred M.D.   On: 03/03/2017 04:16   US Abdomen Complete  Result Date: 03/20/2017 CLINICAL DATA:  71 year old female with abdominal pain x2 days. EXAM: ABDOMEN ULTRASOUND COMPLETE COMPARISON:  Abdominal CT dated 12/16/2016 FINDINGS: Gallbladder: Cholecystectomy. Common bile duct: Diameter: 6 mm. Liver: There is heterogeneous liver echotexture with increased echogenicity likely representing a degree of fatty infiltration. IVC: No abnormality visualized. Pancreas: Visualized portion unremarkable. Spleen: There is a 1.0 x 1.5 x 2.0 cm hyperechoic lesion in the spleen which is incompletely characterized but may represent a hemangioma. A 9 x 8 x 9 mm cyst is also noted within the spleen. Right Kidney: Length: 8.1 cm. Echogenicity within normal limits. No mass or hydronephrosis visualized. Left Kidney: Length: 9.9 cm. Echogenicity within normal limits. No mass or hydronephrosis visualized. Abdominal aorta: Aneurysmal dilatation of the mid abdominal aorta measuring 3.1 cm in diameter. The distal aorta 4.5 cm in diameter. There is atherosclerotic disease of the aorta. Other findings: None. IMPRESSION: 1. Fatty liver. 2. Cholecystectomy. 3. Probable small splenic hemangioma and cyst. 4. Aortic atherosclerosis. The aorta is aneurysmal and measures up to 4.5 cm distally. Recommend followup by abdomen and pelvis CTA in 6 months, and vascular surgery referral/consultation if not already obtained. This recommendation follows ACR consensus guidelines: White Paper of the ACR Incidental Findings Committee II on Vascular Findings. J Am Coll Radiol 2013; 10:789-794. Electronically Signed   By: Anner Crete M.D.   On: 03/20/2017 22:56   Dg Chest Port 1 View  Result Date: 03/12/2017 CLINICAL DATA:  Shortness of breath and nonproductive cough. EXAM:  PORTABLE CHEST 1 VIEW COMPARISON:  12/15/2016 FINDINGS: 1531 hours. Lungs are hyperexpanded right hilar fullness similar to prior studies back to 04/09/2013. Interstitial markings are diffusely coarsened with chronic features. The lungs are clear without focal pneumonia, edema, pneumothorax or pleural effusion. The visualized bony structures of the thorax are intact. Telemetry leads overlie the chest. IMPRESSION: Chronic interstitial coarsening.  No acute cardiopulmonary findings. Electronically Signed   By: Misty Stanley M.D.   On: 03/12/2017 16:47   Dg Abd 2 Views  Result Date: 03/19/2017 CLINICAL DATA:  Constipation. EXAM: ABDOMEN - 2 VIEW COMPARISON:  CT 12/16/2016. FINDINGS: Surgical clips right upper quadrant. Our amount of stool noted throughout the colon and rectum suggesting constipation. No bowel distention or free air. Visceral and aortoiliac atherosclerotic vascular calcification noted. Mild prominence of the perispinal soft tissues of the lower thoracic/ upper lumbar spine is stable and consistent with prominent fat as noted on prior CT. Degenerative changes thoracolumbar spine. IMPRESSION: 1. Large amount of stool noted throughout the colon and rectum consistent with constipation. No bowel distention . No acute abnormality identified. 2. Aortoiliac and visceral atherosclerotic vascular disease . Electronically Signed   By: Marcello Moores  Register   On: 03/19/2017 10:10    Microbiology: No results found for this or any previous visit (from the past 240 hour(s)).   Labs: Basic Metabolic Panel:  Recent Labs Lab 03/16/17 0534 03/18/17 0715 03/19/17 0538 03/20/17 0852 03/21/17 0839  NA 141 141 140 138 139  K 3.8 4.0 4.5 4.0 4.2  CL 104 106 104 101 104  CO2 31 28 32 31 31  GLUCOSE 155* 142* 99 171* 161*  BUN 38* 37* 30* 30* 26*  CREATININE 1.35* 1.27* 1.24* 1.23* 1.17*  CALCIUM 8.8* 8.4* 8.2* 8.3* 8.1*   Liver Function Tests:  Recent Labs Lab 03/20/17 0852 03/21/17 0839  AST 46*  29  ALT 66* 58*  ALKPHOS 56 57  BILITOT 0.7 0.7  PROT 5.5* 5.3*  ALBUMIN 3.1* 2.9*    Recent Labs Lab 03/20/17 0852 03/21/17 0839  LIPASE 54* 29   No results for input(s): AMMONIA in the last 168 hours. CBC:  Recent Labs Lab 03/15/17 0751 03/19/17 0538 03/21/17 0839  WBC 10.2 11.2* 8.8  HGB 14.7 13.5 12.8  HCT 46.1* 41.8 40.2  MCV 89.3 88.7 89.1  PLT 221 193 168   Cardiac Enzymes: No results for input(s): CKTOTAL, CKMB, CKMBINDEX, TROPONINI in the last 168 hours. BNP: BNP (last 3 results) No results for input(s): BNP in the last 8760 hours.  ProBNP (last 3 results) No results for input(s): PROBNP in the last 8760 hours.  CBG:  Recent Labs Lab 03/20/17 1156 03/20/17 1658 03/20/17 2115 03/21/17 0717 03/21/17 1113  GLUCAP 92 162* 90 90 114*       Signed:  Thanos Cousineau  [facility] 03/21/2017, 12:54 PM

## 2017-03-21 NOTE — Progress Notes (Signed)
Date: March 21, 2017 Chart reviewed for discharge orders: [facility] and OT [facility] for a rolling walker. Vernia Buff, [phone]

## 2017-03-21 NOTE — Progress Notes (Signed)
CSW met with patient at bedside, for SNF placement as patient reported to physician and RNCM she was agreeable. Patient reports to CSW  "I just can not do it." CSW reassured patient that going to SNF is voluntary for rehab and she can leave when ready. CSW inquired about placement at patient preferred facility and extended bed offer. CSW informed patient of offer, pt. declines SNF placement.  CSW discussed with physician.  CSW signing off at this time.   Kathrin Greathouse, Latanya Presser, MSW Clinical Social Worker 5E and Psychiatric Service Line [phone] 03/21/2017  11:56 AM

## 2017-03-21 NOTE — Discharge Instructions (Signed)
Chronic Obstructive Pulmonary Disease Exacerbation Chronic obstructive pulmonary disease (COPD) is a common lung problem. In COPD, the flow of air from the lungs is limited. COPD exacerbations are times that breathing gets worse and you need extra treatment. Without treatment they can be life threatening. If they happen often, your lungs can become more damaged. If your COPD gets worse, your doctor may treat you with:  Medicines.  Oxygen.  Different ways to clear your airway, such as using a mask.  Follow these instructions at home:  Do not smoke.  Avoid tobacco smoke and other things that bother your lungs.  If given, take your antibiotic medicine as told. Finish the medicine even if you start to feel better.  Only take medicines as told by your doctor.  Drink enough fluids to keep your pee (urine) clear or pale yellow (unless your doctor has told you not to).  Use a cool mist machine (vaporizer).  If you use oxygen or a machine that turns liquid medicine into a mist (nebulizer), continue to use them as told.  Keep up with shots (vaccinations) as told by your doctor.  Exercise regularly.  Eat healthy foods.  Keep all doctor visits as told. Get help right away if:  You are very short of breath and it gets worse.  You have trouble talking.  You have bad chest pain.  You have blood in your spit (sputum).  You have a fever.  You keep throwing up (vomiting).  You feel weak, or you pass out (faint).  You feel confused.  You keep getting worse. This information is not intended to replace advice given to you by your health care provider. Make sure you discuss any questions you have with your health care provider. Document Released: 09/07/2011 Document Revised: 02/24/2016 Document Reviewed: 05/23/2013 Elsevier Interactive Patient Education  2017 [employer].  Steps to Quit Smoking Smoking tobacco can be bad for your health. It can also affect almost every organ in  your body. Smoking puts you and people around you at risk for many serious long-lasting (chronic) diseases. Quitting smoking is hard, but it is one of the best things that you can do for your health. It is never too late to quit. What are the benefits of quitting smoking? When you quit smoking, you lower your risk for getting serious diseases and conditions. They can include:  Lung cancer or lung disease.  Heart disease.  Stroke.  Heart attack.  Not being able to have children (infertility).  Weak bones (osteoporosis) and broken bones (fractures).  If you have coughing, wheezing, and shortness of breath, those symptoms may get better when you quit. You may also get sick less often. If you are pregnant, quitting smoking can help to lower your chances of having a baby of low birth weight. What can I do to help me quit smoking? Talk with your doctor about what can help you quit smoking. Some things you can do (strategies) include:  Quitting smoking totally, instead of slowly cutting back how much you smoke over a period of time.  Going to in-person counseling. You are more likely to quit if you go to many counseling sessions.  Using resources and support systems, such as: ? Database administrator with a Social worker. ? Phone quitlines. ? Careers information officer. ? Support groups or group counseling. ? Text messaging programs. ? Mobile phone apps or applications.  Taking medicines. Some of these medicines may have nicotine in them. If you are pregnant or breastfeeding, do  not take any medicines to quit smoking unless your doctor says it is okay. Talk with your doctor about counseling or other things that can help you.  Talk with your doctor about using more than one strategy at the same time, such as taking medicines while you are also going to in-person counseling. This can help make quitting easier. What things can I do to make it easier to quit? Quitting smoking might feel very hard at  first, but there is a lot that you can do to make it easier. Take these steps:  Talk to your family and friends. Ask them to support and encourage you.  Call phone quitlines, reach out to support groups, or work with a Social worker.  Ask people who smoke to not smoke around you.  Avoid places that make you want (trigger) to smoke, such as: ? Bars. ? Parties. ? Smoke-break areas at work.  Spend time with people who do not smoke.  Lower the stress in your life. Stress can make you want to smoke. Try these things to help your stress: ? Getting regular exercise. ? Deep-breathing exercises. ? Yoga. ? Meditating. ? Doing a body scan. To do this, close your eyes, focus on one area of your body at a time from head to toe, and notice which parts of your body are tense. Try to relax the muscles in those areas.  Download or buy apps on your mobile phone or tablet that can help you stick to your quit plan. There are many free apps, such as QuitGuide from the [employer] Office manager for Disease Control and Prevention). You can find more support from smokefree.gov and other websites.  This information is not intended to replace advice given to you by your health care provider. Make sure you discuss any questions you have with your health care provider. Document Released: 07/15/2009 Document Revised: 05/16/2016 Document Reviewed: 02/02/2015 Elsevier Interactive Patient Education  2018 [employer].

## 2017-03-21 NOTE — Progress Notes (Signed)
RN spoke to patient's daughter about coming to pick up the patient after getting patient's permission. Patient very agitated about getting discharged. RN spoke to [place], patient's daughter, regarding discharge process. Benjamine Mola stated that patient feels we are discharging her without doing anything. RN talked with the daughter extensively on the phone and explained everything that has been done. Patient apologized for her mother's behavior and said she understands everything and will be there around 1800 to pick up the patient.

## 2017-03-21 NOTE — Progress Notes (Signed)
Discharge instructions and prescription given to patient and patient's daughter, Benjamine Mola. No questions or concerns at this time.

## 2017-03-23 ENCOUNTER — Telehealth: Payer: Self-pay | Admitting: *Deleted

## 2017-03-23 NOTE — Telephone Encounter (Signed)
Called pt to set-up TCM hosp f/u no answer LMOM RTC...Victoria Bird

## 2017-03-26 NOTE — Telephone Encounter (Signed)
Called pt to make Victoria Bird f/u appt. Pt decline to make appt at this time. She states she doesn't have transportation, or money for copay at this time. She will talk with her daughter to see if she can bring her. The nurse form [facility] will be coming out tomorrow, and want to see what they are going to do...Johny Chess

## 2017-03-27 ENCOUNTER — Telehealth: Payer: Self-pay | Admitting: Internal Medicine

## 2017-03-27 DIAGNOSIS — I719 Aortic aneurysm of unspecified site, without rupture: Secondary | ICD-10-CM | POA: Diagnosis not present

## 2017-03-27 DIAGNOSIS — N183 Chronic kidney disease, stage 3 (moderate): Secondary | ICD-10-CM | POA: Diagnosis not present

## 2017-03-27 DIAGNOSIS — I7090 Unspecified atherosclerosis: Secondary | ICD-10-CM | POA: Diagnosis not present

## 2017-03-27 DIAGNOSIS — Z720 Tobacco use: Secondary | ICD-10-CM | POA: Diagnosis not present

## 2017-03-27 DIAGNOSIS — Z7984 Long term (current) use of oral hypoglycemic drugs: Secondary | ICD-10-CM | POA: Diagnosis not present

## 2017-03-27 DIAGNOSIS — R945 Abnormal results of liver function studies: Secondary | ICD-10-CM | POA: Diagnosis not present

## 2017-03-27 DIAGNOSIS — I129 Hypertensive chronic kidney disease with stage 1 through stage 4 chronic kidney disease, or unspecified chronic kidney disease: Secondary | ICD-10-CM | POA: Diagnosis not present

## 2017-03-27 DIAGNOSIS — J441 Chronic obstructive pulmonary disease with (acute) exacerbation: Secondary | ICD-10-CM | POA: Diagnosis not present

## 2017-03-27 DIAGNOSIS — Z7951 Long term (current) use of inhaled steroids: Secondary | ICD-10-CM | POA: Diagnosis not present

## 2017-03-27 DIAGNOSIS — E119 Type 2 diabetes mellitus without complications: Secondary | ICD-10-CM | POA: Diagnosis not present

## 2017-03-27 DIAGNOSIS — R5381 Other malaise: Secondary | ICD-10-CM | POA: Diagnosis not present

## 2017-03-27 DIAGNOSIS — R42 Dizziness and giddiness: Secondary | ICD-10-CM | POA: Diagnosis not present

## 2017-03-27 NOTE — Telephone Encounter (Signed)
Please advise per Crawfords absence

## 2017-03-27 NOTE — Telephone Encounter (Signed)
Reporting pts BP today at visit was 185/80  Needs verbals for a Skilled nursing evaluation   Needs verbals for physical therapy for 1x4

## 2017-04-02 ENCOUNTER — Telehealth: Payer: Self-pay | Admitting: Pulmonary Disease

## 2017-04-02 DIAGNOSIS — R42 Dizziness and giddiness: Secondary | ICD-10-CM | POA: Diagnosis not present

## 2017-04-02 DIAGNOSIS — Z7951 Long term (current) use of inhaled steroids: Secondary | ICD-10-CM | POA: Diagnosis not present

## 2017-04-02 DIAGNOSIS — Z720 Tobacco use: Secondary | ICD-10-CM | POA: Diagnosis not present

## 2017-04-02 DIAGNOSIS — I7090 Unspecified atherosclerosis: Secondary | ICD-10-CM | POA: Diagnosis not present

## 2017-04-02 DIAGNOSIS — J441 Chronic obstructive pulmonary disease with (acute) exacerbation: Secondary | ICD-10-CM | POA: Diagnosis not present

## 2017-04-02 DIAGNOSIS — I719 Aortic aneurysm of unspecified site, without rupture: Secondary | ICD-10-CM | POA: Diagnosis not present

## 2017-04-02 DIAGNOSIS — R5381 Other malaise: Secondary | ICD-10-CM | POA: Diagnosis not present

## 2017-04-02 DIAGNOSIS — I129 Hypertensive chronic kidney disease with stage 1 through stage 4 chronic kidney disease, or unspecified chronic kidney disease: Secondary | ICD-10-CM | POA: Diagnosis not present

## 2017-04-02 DIAGNOSIS — E119 Type 2 diabetes mellitus without complications: Secondary | ICD-10-CM | POA: Diagnosis not present

## 2017-04-02 DIAGNOSIS — N183 Chronic kidney disease, stage 3 (moderate): Secondary | ICD-10-CM | POA: Diagnosis not present

## 2017-04-02 DIAGNOSIS — Z7984 Long term (current) use of oral hypoglycemic drugs: Secondary | ICD-10-CM | POA: Diagnosis not present

## 2017-04-02 DIAGNOSIS — R945 Abnormal results of liver function studies: Secondary | ICD-10-CM | POA: Diagnosis not present

## 2017-04-02 NOTE — Telephone Encounter (Signed)
Noted  

## 2017-04-02 NOTE — Telephone Encounter (Signed)
A surgical clearance form was faxed to [place] and [place] at [phone]. A confirmation fax sheet was received. Nothing further is needed.

## 2017-04-02 NOTE — Telephone Encounter (Signed)
Just FYI.

## 2017-04-02 NOTE — Telephone Encounter (Signed)
Physical thereapists Shelby Dubin will be discharging Pt from home health today, she is doing much better

## 2017-04-03 ENCOUNTER — Telehealth: Payer: Self-pay | Admitting: Pulmonary Disease

## 2017-04-03 NOTE — Telephone Encounter (Signed)
JN  Please Advise-  Pt called in upset due to her not being cleared for eye surgery. Attempted to explain to pt what you wrote on the form, stating due to her recent hospitalization. Pt states she does not understand, she has no complaints as far as her breathing since being out the hospital. She states the clearance is due by 7/9 in order to have the procedure towards the end of the month. Did you want to fit pt in on Friday to see her to discuss further

## 2017-04-05 ENCOUNTER — Telehealth: Payer: Self-pay | Admitting: Pulmonary Disease

## 2017-04-05 NOTE — Telephone Encounter (Signed)
Spoke with the pt  She states she is cancelling ov with JN for surgery clearance due to transportation issues  She states that she is needing to reschedule and does not want to wait until JN's next available  Please advised if a blocked spot can be used, thanks

## 2017-04-05 NOTE — Telephone Encounter (Signed)
JN please advise. thanks 

## 2017-04-05 NOTE — Telephone Encounter (Signed)
Spoke with pt. She has been scheduled with JN on 04/06/2017 at 1:30pm. Nothing further was needed.

## 2017-04-05 NOTE — Telephone Encounter (Signed)
Does anyone else have an open spot?... TP or SG to get her seen? If not go ahead and unblock a spot. Thanks.

## 2017-04-06 ENCOUNTER — Ambulatory Visit: Payer: Medicare Other | Admitting: Pulmonary Disease

## 2017-04-06 NOTE — Telephone Encounter (Signed)
Pt has been scheduled for 7/13 at 9am since Wednesday's schedule was already completely booked. She verbalized understanding. Nothing further needed at time of call.

## 2017-04-06 NOTE — Telephone Encounter (Signed)
You can use a blocked spot for next Wednesday afternoon. J.

## 2017-04-10 DIAGNOSIS — R5381 Other malaise: Secondary | ICD-10-CM | POA: Diagnosis not present

## 2017-04-10 DIAGNOSIS — I129 Hypertensive chronic kidney disease with stage 1 through stage 4 chronic kidney disease, or unspecified chronic kidney disease: Secondary | ICD-10-CM | POA: Diagnosis not present

## 2017-04-10 DIAGNOSIS — I7090 Unspecified atherosclerosis: Secondary | ICD-10-CM | POA: Diagnosis not present

## 2017-04-10 DIAGNOSIS — Z7984 Long term (current) use of oral hypoglycemic drugs: Secondary | ICD-10-CM | POA: Diagnosis not present

## 2017-04-10 DIAGNOSIS — R945 Abnormal results of liver function studies: Secondary | ICD-10-CM | POA: Diagnosis not present

## 2017-04-10 DIAGNOSIS — Z720 Tobacco use: Secondary | ICD-10-CM | POA: Diagnosis not present

## 2017-04-10 DIAGNOSIS — E119 Type 2 diabetes mellitus without complications: Secondary | ICD-10-CM | POA: Diagnosis not present

## 2017-04-10 DIAGNOSIS — R42 Dizziness and giddiness: Secondary | ICD-10-CM | POA: Diagnosis not present

## 2017-04-10 DIAGNOSIS — I719 Aortic aneurysm of unspecified site, without rupture: Secondary | ICD-10-CM | POA: Diagnosis not present

## 2017-04-10 DIAGNOSIS — N183 Chronic kidney disease, stage 3 (moderate): Secondary | ICD-10-CM | POA: Diagnosis not present

## 2017-04-10 DIAGNOSIS — J441 Chronic obstructive pulmonary disease with (acute) exacerbation: Secondary | ICD-10-CM | POA: Diagnosis not present

## 2017-04-10 DIAGNOSIS — Z7951 Long term (current) use of inhaled steroids: Secondary | ICD-10-CM | POA: Diagnosis not present

## 2017-04-13 ENCOUNTER — Ambulatory Visit (INDEPENDENT_AMBULATORY_CARE_PROVIDER_SITE_OTHER): Payer: Medicare Other | Admitting: Pulmonary Disease

## 2017-04-13 ENCOUNTER — Encounter: Payer: Self-pay | Admitting: Pulmonary Disease

## 2017-04-13 VITALS — BP 118/68 | HR 97 | Ht 63.0 in | Wt 158.8 lb

## 2017-04-13 DIAGNOSIS — F17200 Nicotine dependence, unspecified, uncomplicated: Secondary | ICD-10-CM

## 2017-04-13 DIAGNOSIS — J449 Chronic obstructive pulmonary disease, unspecified: Secondary | ICD-10-CM | POA: Diagnosis not present

## 2017-04-13 DIAGNOSIS — K219 Gastro-esophageal reflux disease without esophagitis: Secondary | ICD-10-CM

## 2017-04-13 DIAGNOSIS — Z23 Encounter for immunization: Secondary | ICD-10-CM

## 2017-04-13 DIAGNOSIS — F17210 Nicotine dependence, cigarettes, uncomplicated: Secondary | ICD-10-CM | POA: Diagnosis not present

## 2017-04-13 MED ORDER — TIOTROPIUM BROMIDE MONOHYDRATE 18 MCG IN CAPS: 18.0000 ug | ORAL_CAPSULE | Freq: Every day | RESPIRATORY_TRACT | 5 refills | Status: AC

## 2017-04-13 NOTE — Patient Instructions (Addendum)
   Remember to get your Pulmicort (Budesonide) filled at your pharmacy. This was sent to your pharmacy when you were discharged from the hospital.  Remember to remove any dentures or partials you have before you use your Pulmicort. Remember to brush your teeth & tongue after you use your Pulmicort as well as rinse, gargle & spit to keep from getting thrush in your mouth or on your tongue (a white film).   Let me know if you need any refills of the Pulmicort before your next appointment.   Call me if you have any new breathing problems or questions before your next appointment.  We will work on getting a formulary exception for you to switch from ProAir to Ventolin.  TESTS ORDERED: 1. 46mwt on room air at next appointment

## 2017-04-13 NOTE — Progress Notes (Signed)
Subjective:    Patient ID: Victoria Bird, female    DOB: 07-13-1946, 71 y.o.   MRN: [id number]  C.C.:  Follow-up after hospitalization with known Severe COPD, OSA, GERD, & Tobacco Use Disoder.  HPI  Hospitalized in June for acute hypoxic respiratory failure and a COPD exacerbation. She is planned for eye surgery later this month. She is planned for cataract surgery in her left eye. She reports that they are planning on doing it under conscious sedation. She reports she didn't have any problems with her prior procedure.   Severe COPD: Patient previously was on Advair. At last appointment patient was given sample of Symbicort with spacer to try & instructions to contact our office for prescription if this help control symptoms. Continued on Spiriva. During hospitalization patient transitioned to Pulmicort nebulized twice daily from Symbicort. She reports she isn't aware that she is supposed to be using Pulmicort. She does have albuterol that she is using in her nebulizer. She reports she is using her ProAir inhaler but feels it is ineffective compared with her prior Ventolin inhaler. She reports she is using it 3-4 times daily depending on her level of activity and heat/humidity. She reports no wheezing. She is still coughing intermittently.   GERD: Following with GI. Previously on Protonix. She reports she is compliant with Protonix. She has Pepcid on her MAR but isn't aware that she is using it. She has minimal symptoms depending on dietary indiscretion. No morning brash water taste.   OSA: Last polysomnogram was 2010. Previously declined CPAP therapy due to claustrophobia.  Tobacco use disorder: Patient with multiple psychosocial stressors. At last appointment patient was smoking intermittently. She is still smoking off & on depending on stressful situations.   Review of Systems No chest tightness, pressure or pain. No fever, chills, or sweats. No new abdominal pain or nausea.   Allergies    Allergen Reactions  . Aspartame And Phenylalanine Nausea And Vomiting    Patient says she is allergic to all artificial sweeteners  . Doxycycline Other (See Comments)    Nausea, vomiting, HA, double vision  . Tramadol Shortness Of Breath and Nausea Only  . Codeine Itching    Has not tried benadryl to alleviate side effects  . Neurontin [Gabapentin] Other (See Comments)    Dizzy, "drugged" feeling, sleepy  . Sulfa Antibiotics Other (See Comments)    Kidney problem   . Symbicort [Budesonide-Formoterol Fumarate]     Swelling of face and inside of mouth per pt  . Penicillins Rash    Has patient had a PCN reaction causing immediate rash, facial/tongue/throat swelling, SOB or lightheadedness with hypotension: No Has patient had a PCN reaction causing severe rash involving mucus membranes or skin necrosis: No Has patient had a PCN reaction that required hospitalization No Has patient had a PCN reaction occurring within the last 10 years: No If all of the above answers are "NO", then may proceed with Cephalosporin use.  . Prednisone Rash    Patient stated she received Prednisone while she was in the hospital and experienced a rash and "extreme pain.'   Current Outpatient Prescriptions on File Prior to Visit  Medication Sig Dispense Refill  . albuterol (ACCUNEB) 0.63 MG/3ML nebulizer solution Take 1 ampule by nebulization every 6 (six) hours as needed for wheezing or shortness of breath.    Marland Kitchen albuterol (PROAIR HFA) 108 (90 Base) MCG/ACT inhaler Inhale 2 puffs into the lungs every 6 (six) hours as needed for wheezing or shortness  of breath. 1 Inhaler 5  . benzonatate (TESSALON) 200 MG capsule Take 1 capsule (200 mg total) by mouth 3 (three) times daily. 30 capsule 0  . budesonide (PULMICORT) 0.25 MG/2ML nebulizer solution Take 2 mLs (0.25 mg total) by nebulization 2 (two) times daily. 60 mL 1  . dextromethorphan-guaiFENesin (MUCINEX DM) 30-600 MG 12hr tablet Take 2 tablets by mouth 2 (two) times  daily. 30 tablet 0  . famotidine (PEPCID) 20 MG tablet Take 1 tablet (20 mg total) by mouth 2 (two) times daily. 60 tablet 0  . fluticasone (FLONASE) 50 MCG/ACT nasal spray Place 1 spray into both nostrils daily. 16 g 0  . lactose free nutrition (BOOST PLUS) LIQD Take 237 mLs by mouth 3 (three) times daily with meals. 90 Can 0  . losartan-hydrochlorothiazide (HYZAAR) 100-12.5 MG tablet Take 1 tablet by mouth daily.     . meclizine (ANTIVERT) 12.5 MG tablet Take 1 tablet (12.5 mg total) by mouth every 6 (six) hours as needed for dizziness or nausea. 30 tablet 0  . metFORMIN (GLUCOPHAGE) 500 MG tablet TAKE 1 TABLET(500 MG) BY MOUTH TWICE DAILY WITH A MEAL 90 tablet 1  . pantoprazole (PROTONIX) 40 MG tablet Take 1 tablet (40 mg total) by mouth daily. 30 tablet 0  . SPIRIVA HANDIHALER 18 MCG inhalation capsule PLACE 1 CAPSULE INTO INHALER AND INHALE DAILY 30 capsule 5  . BESIVANCE 0.6 % SUSP Place 1 drop into the right eye 3 (three) times daily.    . DUREZOL 0.05 % EMUL Place 1 drop into the right eye 2 (two) times daily.    Marland Kitchen HYDROcodone-acetaminophen (NORCO/VICODIN) 5-325 MG tablet Take 1 tablet by mouth every 6 (six) hours as needed for moderate pain. (Patient not taking: Reported on 04/13/2017) 20 tablet 0  . HYDROcodone-homatropine (HYCODAN) 5-1.5 MG/5ML syrup Take 5 mLs by mouth every 4 (four) hours as needed for cough. (Patient not taking: Reported on 04/13/2017) 120 mL 0  . PROLENSA 0.07 % SOLN Place 1 drop into the right eye at bedtime.    Marland Kitchen Spacer/Aero-Holding Chambers (AEROCHAMBER MV) inhaler Use as instructed (Patient not taking: Reported on 04/13/2017) 1 each 0   No current facility-administered medications on file prior to visit.    Past Medical History:  Diagnosis Date  . Anxiety   . Chronic pain   . Colon polyps    adenomatous  . COPD (chronic obstructive pulmonary disease) ([place])   . Diabetes mellitus   . Diverticulitis   . Diverticulosis   . Emphysema lung ([place])   . Gall stones    . GERD (gastroesophageal reflux disease)   . H/O: pneumonia   . High cholesterol   . Hypertension   . OSA (obstructive sleep apnea)    no cpap  . Tracheobronchitis 01/01/2012   Past Surgical History:  Procedure Laterality Date  . ABDOMINAL HYSTERECTOMY    . APPENDECTOMY    . Bilateral shoulder surgery    . CARPAL TUNNEL RELEASE Left   . CHOLECYSTECTOMY    . COLONOSCOPY    . ESOPHAGOGASTRODUODENOSCOPY    . FRACTURE SURGERY    . Right knee surgery    . ROTATOR CUFF REPAIR     Family History  Problem Relation Age of Onset  . Heart attack Mother   . Diabetes Maternal Grandfather   . Heart attack Maternal Grandfather   . Hypertension Maternal Grandfather   . Stroke Maternal Grandfather   . Heart attack Maternal Grandmother   . Hypertension Maternal Grandmother   .  Alcohol abuse Father   . Thyroid disease Neg Hx   . Lung disease Neg Hx   . Colon cancer Neg Hx   . Breast cancer Neg Hx    Social History   Social History  . Marital status: Divorced    Spouse name: N/A  . Number of children: 3  . Years of education: GED   Occupational History  . Disability    Social History Main Topics  . Smoking status: Current Some Day Smoker    Packs/day: 1.50    Years: 41.00    Types: Cigarettes    Start date: 03/19/1974  . Smokeless tobacco: Never Used     Comment: daily amount of cigarettes varies depending on stress level   . Alcohol use No  . Drug use: No  . Sexual activity: No   Other Topics Concern  . None   Social History Narrative   Daughter Edmonia Lynch) [phone]   Originally from [place]. Previously lived in [place]. She has traveled from [facility] to [place] & has also traveled across country to [place]. Multiple visits to [place], [place], [place], [place], [place]. She has a dog at home. No bird, mold, or hot tub exposure. She reports some years ago she did live in a home that had black mold in her closet. 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       Pt lives with her youngest daughter, lives in a trailer, does have stairs into home. Does have some troubled with that. Highest level of education is GED.       Objective:   Physical Exam BP 118/68 (BP Location: Left Arm, Patient Position: Sitting, Cuff Size: Normal)   Pulse 97   Ht 5\' 3"  (1.6 m)   Wt 158 lb 12.8 oz (72 kg)   SpO2 90%   BMI 28.13 kg/m   General:  Awake. Alert. Elderly female. No distress. Integument:  Warm & dry. No rash on exposed skin.  Extremities:  No cyanosis or clubbing.  HEENT:  Moist mucus membranes. Poor dentition. Minimal nasal turbinate swelling. Cardiovascular:  Regular rate. No edema. No appreciable JVD.  Pulmonary:  Symmetrically decreased aeration. No accessory muscle use on room air. Speaking in complete sentences. Otherwise clear to auscultation. Abdomen: Soft. Normal bowel sounds. Nondistended. Musculoskeletal:  Normal bulk and tone. No joint deformity or effusion appreciated.  PFT 01/28/16: FVC 2.31 L (77%) FEV1 1.16 L (81%) FEV1/FVC 0.50 FEF 25-75 0.47 L (24%) no bronchodilator response 06/09/15: FVC 2.21 L (73%) FEV1 1.08 L (47%) FEV1/FVC 0.49 FEF 25-75 0.43 L (22%) no bronchodilator response TLC 5.26 L (105%) RV 135% ERV 87% DLCO corrected 36% (Hgb 16.2)  6MWT 10/20/15:  Walked 240 meters / Baseline Sat 94% on RA / Nadir Sat 90% on RA 06/22/15:  3 laps around office. Baseline 93% on RA. Nadir Sat 89% on RA. 06/18/15:  Walked in hospital by PT / Baseline Sat 90% on RA / Nadir Sat 86% on RA (required 3 L/m on exertion to maintain)  IMAGING CXR PA/LAT 03/19/16 (personally reviewed by me):  No parenchymal mass or opacity appreciated. No pleural effusion. Heart normal in size & mediastinum normal in contour.   CXR PA/LAT 06/16/16 (previously reviewed by me):  No focal opacity. No effusion. Normal heart size. Normal mediastinal contour.   CXR PA/LAT  10/08/14 (previously reviewed by me): Mild persistent  linear opacity consistent with scar at the right lung base. Kyphosis noted. Hyperinflation with some flattening of the diaphragm. No pleural effusion or thickening appreciated. No parenchymal opacity or nodule appreciated. Heart normal in size. Mediastinum: Contour.  CT CHEST W/ 04/16/02 (per radiologist): No evidence for pulmonary emboli. No pathologically enlarged mediastinal or hilar lymph nodes. Enlarged lower pole of left thyroid lobe with a nodular configuration. Mild water density alveolitis with an anterior portion of left upper lobe & medial anterior portion of right upper lobe. No parenchymal nodules.   CARDIAC TTE (01/08/13): LV normal in size. EF 60-65%. Normal wall motion without regional abnormality. LA & RA normal in size. RV normal in size and function. Pulmonary artery systolic pressure 43 mmHg. Trivial aortic regurgitation. No mitral stenosis or regurgitation. No pulmonic or tricuspid regurgitation. No pericardial effusion.  LABS 05/27/15 Alpha-1 antitrypsin: 164  04/29/15 BMP: 140/4.2/102/24/16/1.01/93/9 LFT: 4/6.7/0.7/67/14/13  10/09/14 VBG (on room air): 7.39/37/59  03/24/13 ANA: Negative  RF:  <7.0    Assessment & Plan:  71 y.o. female being seen in follow-up for her recent hospitalization & evaluation of perioperative pulmonary surgical risk assessment with known severe COPD, OSA, GERD, and tobacco use disorder. Patient was hospitalized for acute COPD exacerbation and acute hypoxic respiratory failure. She seems to have recovered well. No signs of acute exacerbation of her COPD at this time. Reviewing her x-ray reveals no pneumonia or obvious infectious etiology to her decompensation. Her reflux seems to be well-controlled. I instructed the patient contact me if she had any questions or concerns before next appointment.  1. Perioperative pulmonary surgical risk assessment: Low risk of perioperative pulmonary complications as  long as appropriate precautions are taken. Deep sedation good result in acute hypercarbic respiratory failure with her underlying OSA. 2. Acute hypoxic respiratory failure: Seems to have resolved. Checking 6 minute walk test on room air at next appointment. 3. Severe COPD: Continuing Spiriva. Trying Pulmicort 0.25 mg nebulized twice daily. Patient will notify me for refills if needed. 4. GERD: Controlled with Protonix. No new medications. 5. Tobacco use disorder: Patient counseled for over 3 minutes of need for complete tobacco cessation. Unable to quit at this time. 6. Health maintenance: Status post Pneumovax April 2017 & Tdap April 2017. Administering Prevnar vaccine today. 7. Follow-up: Return to clinic in 3 months or sooner if needed.  Sonia Baller Ashok Cordia, M.D. [facility] Pager:  [phone] After 3pm or if no response, call [phone] 9:24 AM 04/13/17

## 2017-04-16 ENCOUNTER — Telehealth: Payer: Self-pay | Admitting: Pulmonary Disease

## 2017-04-16 NOTE — Telephone Encounter (Signed)
A PA was initiated through [facility] line [phone]) for brand Ventolin. Mee Hives stated the PA was approved until October 01, 2017 with reference number [id number]. [facility] [phone]) and patient made aware of approval. Nothing further is needed.

## 2017-04-17 ENCOUNTER — Telehealth: Payer: Self-pay | Admitting: Pulmonary Disease

## 2017-04-17 NOTE — Telephone Encounter (Signed)
Left message for patient to call back.   Per Marily Lente and the note from her visit on 7/13, she was cleared.

## 2017-04-17 NOTE — Telephone Encounter (Signed)
lmom tcb x1 to pt,  Office notes that cleared patient was sent to patient's eye doctor via epic. Contacted eye doctor to see if there is an actual form that they need filled out, and they will fax form. [place] fax number provided. Will await call back from patient as well as fax

## 2017-04-17 NOTE — Telephone Encounter (Signed)
Pt returning call and can be reached @ [phone], pt stating that info was not sent to eye dr, she was told that it would be sent to him on that day, please advise The fax # is [phone] Dr Talbert Forest

## 2017-04-18 NOTE — Telephone Encounter (Signed)
I have checked JN's look at and up front. Form has not been received. I contacted the pt's eye doctor's office. They are going to refax the form that they need.

## 2017-04-20 NOTE — Telephone Encounter (Signed)
Called eye doctor's office back and informed them that we have not received the form yet. Left a detailed message for Brandi to call back so we can get this taken care of.

## 2017-04-23 NOTE — Telephone Encounter (Signed)
Form is not in JN's look-ats. [place] and [place], they are currently closed for lunch. WCB.

## 2017-04-24 ENCOUNTER — Telehealth: Payer: Self-pay | Admitting: Pulmonary Disease

## 2017-04-24 NOTE — Telephone Encounter (Signed)
Spoke with Rise Paganini with [facility] and [place]. Rise Paganini states pt was scheduled for surgery 04/23/17, however it appears that surgery has been canceled.  Rise Paganini states forms were received today from our office for clearance, and pt's surgery will be rescheduled.  Nothing further needed.

## 2017-04-26 NOTE — Telephone Encounter (Signed)
A surgical clearance was faxed to [place] and [place] at [phone]. A confirmation fax sheet was received. Nothing further is needed.

## 2017-06-13 ENCOUNTER — Other Ambulatory Visit: Payer: Self-pay | Admitting: Internal Medicine

## 2017-06-17 ENCOUNTER — Other Ambulatory Visit: Payer: Self-pay | Admitting: Internal Medicine

## 2017-06-25 ENCOUNTER — Other Ambulatory Visit: Payer: Self-pay | Admitting: Internal Medicine

## 2017-06-25 DIAGNOSIS — E0800 Diabetes mellitus due to underlying condition with hyperosmolarity without nonketotic hyperglycemic-hyperosmolar coma (NKHHC): Secondary | ICD-10-CM

## 2017-07-02 DIAGNOSIS — H2512 Age-related nuclear cataract, left eye: Secondary | ICD-10-CM | POA: Diagnosis not present

## 2017-07-05 ENCOUNTER — Ambulatory Visit: Payer: Medicare Other

## 2017-07-09 DIAGNOSIS — H2513 Age-related nuclear cataract, bilateral: Secondary | ICD-10-CM | POA: Diagnosis not present

## 2017-07-18 NOTE — Progress Notes (Signed)
Pre visit review using our clinic review tool, if applicable. No additional management support is needed unless otherwise documented below in the visit note. 

## 2017-07-18 NOTE — Progress Notes (Signed)
Subjective:   Victoria Bird is a 71 y.o. female who presents for Medicare Annual (Subsequent) preventive examination.  Review of Systems:  No ROS.  Medicare Wellness Visit. Additional risk factors are reflected in the social history.  Cardiac Risk Factors include: diabetes mellitus;advanced age (>11men, >53 women);hypertension;sedentary lifestyle;smoking/ tobacco exposure Sleep patterns: has frequent nighttime awakenings, gets up 1-2 times nightly to void and sleeps 2-3 hours nightly.  Patient reports insomnia issues, discussed recommended sleep tips and stress reduction tips.   Home Safety/Smoke Alarms: Feels safe in home. Smoke alarms in place.  Living environment; residence and Firearm Safety: 1-story house/ trailer, no firearms. Lives with grandson, no needs for DME, limited support system Seat Belt Safety/Bike Helmet: Wears seat belt.     Objective:     Vitals: BP 136/72   Pulse 80   Temp 97.8 F (36.6 C)   Resp 20   Ht 5\' 3"  (1.6 m)   Wt 155 lb (70.3 kg)   SpO2 95%   BMI 27.46 kg/m   Body mass index is 27.46 kg/m.   Tobacco History  Smoking Status  . Current Some Day Smoker  . Packs/day: 1.50  . Years: 41.00  . Types: Cigarettes  . Start date: 03/19/1974  Smokeless Tobacco  . Never Used    Comment: daily amount of cigarettes varies depending on stress level      Ready to quit: Not Answered Counseling given: Not Answered   Past Medical History:  Diagnosis Date  . Anxiety   . Chronic pain   . Colon polyps    adenomatous  . COPD (chronic obstructive pulmonary disease) ([place])   . Diabetes mellitus   . Diverticulitis   . Diverticulosis   . Emphysema lung ([place])   . Gall stones   . GERD (gastroesophageal reflux disease)   . H/O: pneumonia   . High cholesterol   . Hypertension   . OSA (obstructive sleep apnea)    no cpap  . Tracheobronchitis 01/01/2012   Past Surgical History:  Procedure Laterality Date  . ABDOMINAL HYSTERECTOMY    .  APPENDECTOMY    . Bilateral shoulder surgery    . CARPAL TUNNEL RELEASE Left   . CHOLECYSTECTOMY    . COLONOSCOPY    . ESOPHAGOGASTRODUODENOSCOPY    . FRACTURE SURGERY    . Right knee surgery    . ROTATOR CUFF REPAIR     Family History  Problem Relation Age of Onset  . Heart attack Mother   . Diabetes Maternal Grandfather   . Heart attack Maternal Grandfather   . Hypertension Maternal Grandfather   . Stroke Maternal Grandfather   . Heart attack Maternal Grandmother   . Hypertension Maternal Grandmother   . Alcohol abuse Father   . Thyroid disease Neg Hx   . Lung disease Neg Hx   . Colon cancer Neg Hx   . Breast cancer Neg Hx    History  Sexual Activity  . Sexual activity: No    Outpatient Encounter Prescriptions as of 07/19/2017  Medication Sig  . albuterol (ACCUNEB) 0.63 MG/3ML nebulizer solution Take 1 ampule by nebulization every 6 (six) hours as needed for wheezing or shortness of breath.  Marland Kitchen albuterol (PROAIR HFA) 108 (90 Base) MCG/ACT inhaler Inhale 2 puffs into the lungs every 6 (six) hours as needed for wheezing or shortness of breath.  . BESIVANCE 0.6 % SUSP Place 1 drop into the right eye 3 (three) times daily.  . budesonide (PULMICORT) 0.25 MG/2ML  nebulizer solution Take 2 mLs (0.25 mg total) by nebulization 2 (two) times daily.  . DUREZOL 0.05 % EMUL Place 1 drop into the right eye 2 (two) times daily.  . famotidine (PEPCID) 20 MG tablet Take 1 tablet (20 mg total) by mouth 2 (two) times daily.  Marland Kitchen lactose free nutrition (BOOST PLUS) LIQD Take 237 mLs by mouth 3 (three) times daily with meals.  Marland Kitchen losartan-hydrochlorothiazide (HYZAAR) 100-12.5 MG tablet TAKE 1 TABLET BY MOUTH DAILY  . meclizine (ANTIVERT) 12.5 MG tablet Take 1 tablet (12.5 mg total) by mouth every 6 (six) hours as needed for dizziness or nausea.  . metFORMIN (GLUCOPHAGE) 500 MG tablet TAKE 1 TABLET(500 MG) BY MOUTH TWICE DAILY WITH A MEAL  . pantoprazole (PROTONIX) 40 MG tablet Take 1 tablet (40 mg  total) by mouth daily.  Marland Kitchen PROLENSA 0.07 % SOLN Place 1 drop into the right eye at bedtime.  Marland Kitchen tiotropium (SPIRIVA HANDIHALER) 18 MCG inhalation capsule Place 1 capsule (18 mcg total) into inhaler and inhale daily.  . [DISCONTINUED] benzonatate (TESSALON) 200 MG capsule Take 1 capsule (200 mg total) by mouth 3 (three) times daily.  . [DISCONTINUED] dextromethorphan-guaiFENesin (MUCINEX DM) 30-600 MG 12hr tablet Take 2 tablets by mouth 2 (two) times daily.  . [DISCONTINUED] meclizine (ANTIVERT) 12.5 MG tablet Take 1 tablet (12.5 mg total) by mouth every 6 (six) hours as needed for dizziness or nausea.  . [DISCONTINUED] Spacer/Aero-Holding Chambers (AEROCHAMBER MV) inhaler Use as instructed  . fluticasone (FLONASE) 50 MCG/ACT nasal spray Place 1 spray into both nostrils daily.  . [DISCONTINUED] HYDROcodone-acetaminophen (NORCO/VICODIN) 5-325 MG tablet Take 1 tablet by mouth every 6 (six) hours as needed for moderate pain. (Patient not taking: Reported on 04/13/2017)  . [DISCONTINUED] HYDROcodone-homatropine (HYCODAN) 5-1.5 MG/5ML syrup Take 5 mLs by mouth every 4 (four) hours as needed for cough. (Patient not taking: Reported on 07/19/2017)  . [DISCONTINUED] losartan-hydrochlorothiazide (HYZAAR) 100-12.5 MG tablet Take 1 tablet by mouth daily.    No facility-administered encounter medications on file as of 07/19/2017.     Activities of Daily Living In your present state of health, do you have any difficulty performing the following activities: 07/19/2017 03/12/2017  Hearing? N N  Vision? N Y  Difficulty concentrating or making decisions? N N  Walking or climbing stairs? N Y  Dressing or bathing? N N  Doing errands, shopping? [place] Don[place]  Preparing Food and eating ? N -  Using the Toilet? N -  In the past six months, have you accidently leaked urine? N -  Do you have problems with loss of bowel control? N -  Managing your Medications? N -  Managing your Finances? N -  Housekeeping or managing your  Housekeeping? Y -  Some recent data might be hidden    Patient Care Team: Hoyt Koch, MD as PCP - General (Internal Medicine)    Assessment:    Physical assessment deferred to PCP.  Exercise Activities and Dietary recommendations Current Exercise Habits: The patient does not participate in regular exercise at present, Exercise limited by: orthopedic condition(s);respiratory conditions(s)  Diet (meal preparation, eat out, water intake, caffeinated beverages, dairy products, fruits and vegetables): in general, a "healthy" diet  , on average, 2 meals per day   Reviewed heart healthy and diabetic diet, encouraged patient to increase daily water intake. Encouraged patient to supplement with Boost (samples and coupons provided) Application and contact information provided to patient to start process to apply for Meals on Wheels.  Goals    .  Increase my physical activity          Each day move around the house and yard a little bit more      Fall Risk Fall Risk  07/19/2017 01/18/2017 02/25/2016 07/29/2015  Falls in the past year? No No Yes Yes  Number falls in past yr: - - 2 or more 2 or more  Injury with Fall? - - Yes Yes  Risk Factor Category  - - - High Fall Risk  Follow up - - - Education provided;Falls prevention discussed;Falls evaluation completed   Depression Screen PHQ 2/9 Scores 07/19/2017 01/18/2017  PHQ - 2 Score 2 6  PHQ- 9 Score 9 -     Cognitive Function MMSE - Mini Mental State Exam 07/19/2017  Orientation to time 5  Orientation to Place 5  Registration 3  Attention/ Calculation 4  Recall 1  Language- name 2 objects 2  Language- repeat 1  Language- follow 3 step command 3  Language- read & follow direction 1  Write a sentence 1  Copy design 1  Total score 27        Immunization History  Administered Date(s) Administered  . Influenza Split 12/15/2011  . Influenza, High Dose Seasonal PF 07/28/2015, 07/19/2016, 07/19/2017  .  Influenza,inj,Quad PF,6+ Mos 10/12/2014  . Pneumococcal Conjugate-13 04/13/2017  . Pneumococcal Polysaccharide-23 12/15/2011  . Pneumococcal-Unspecified 01/25/2016  . Tdap 01/25/2016   Screening Tests Health Maintenance  Topic Date Due  . Hepatitis C Screening  September 11, 1946  . FOOT EXAM  03/19/1957  . DEXA SCAN  03/19/2012  . INFLUENZA VACCINE  05/02/2017  . HEMOGLOBIN A1C  06/18/2017  . OPHTHALMOLOGY EXAM  01/31/2018  . MAMMOGRAM  02/02/2019  . COLONOSCOPY  09/05/2022  . TETANUS/TDAP  01/24/2026  . PNA vac Low Risk Adult  Completed      Plan:    Continue doing brain stimulating activities (puzzles, reading, adult coloring books, staying active) to keep memory sharp.   Continue to eat heart healthy diet (full of fruits, vegetables, whole grains, lean protein, water--limit salt, fat, and sugar intake) and increase physical activity as tolerated.  I have personally reviewed and noted the following in the patient's chart:   . Medical and social history . Use of alcohol, tobacco or illicit drugs  . Current medications and supplements . Functional ability and status . Nutritional status . Physical activity . Advanced directives . List of other physicians . Vitals . Screenings to include cognitive, depression, and falls . Referrals and appointments  In addition, I have reviewed and discussed with patient certain preventive protocols, quality metrics, and best practice recommendations. A written personalized care plan for preventive services as well as general preventive health recommendations were provided to patient.     Michiel Cowboy, RN  07/19/2017

## 2017-07-19 ENCOUNTER — Ambulatory Visit (INDEPENDENT_AMBULATORY_CARE_PROVIDER_SITE_OTHER): Payer: Medicare Other | Admitting: Internal Medicine

## 2017-07-19 ENCOUNTER — Other Ambulatory Visit (INDEPENDENT_AMBULATORY_CARE_PROVIDER_SITE_OTHER): Payer: Medicare Other

## 2017-07-19 ENCOUNTER — Encounter: Payer: Self-pay | Admitting: Internal Medicine

## 2017-07-19 VITALS — BP 136/72 | HR 80 | Temp 97.8°F | Resp 20 | Ht 63.0 in | Wt 155.0 lb

## 2017-07-19 DIAGNOSIS — E1121 Type 2 diabetes mellitus with diabetic nephropathy: Secondary | ICD-10-CM

## 2017-07-19 DIAGNOSIS — R42 Dizziness and giddiness: Secondary | ICD-10-CM | POA: Diagnosis not present

## 2017-07-19 DIAGNOSIS — Z23 Encounter for immunization: Secondary | ICD-10-CM

## 2017-07-19 DIAGNOSIS — Z0000 Encounter for general adult medical examination without abnormal findings: Secondary | ICD-10-CM | POA: Diagnosis not present

## 2017-07-19 DIAGNOSIS — E2839 Other primary ovarian failure: Secondary | ICD-10-CM

## 2017-07-19 DIAGNOSIS — G894 Chronic pain syndrome: Secondary | ICD-10-CM | POA: Diagnosis not present

## 2017-07-19 LAB — LIPID PANEL
CHOLESTEROL: 196 mg/dL (ref 0–200)
HDL: 28 mg/dL — ABNORMAL LOW (ref >39.00)
Total CHOL/HDL Ratio: 7

## 2017-07-19 LAB — COMPREHENSIVE METABOLIC PANEL
ALBUMIN: 4 g/dL (ref 3.5–5.2)
ALT: 20 U/L (ref 0–35)
AST: 16 U/L (ref 0–37)
Alkaline Phosphatase: 66 U/L (ref 39–117)
BUN: 20 mg/dL (ref 6–23)
CALCIUM: 9.3 mg/dL (ref 8.4–10.5)
CHLORIDE: 99 meq/L (ref 96–112)
CO2: 31 mEq/L (ref 19–32)
Creatinine, Ser: 1.33 mg/dL — ABNORMAL HIGH (ref 0.40–1.20)
GFR: 41.88 mL/min — AB (ref >60.00)
Glucose, Bld: 100 mg/dL — ABNORMAL HIGH (ref 70–99)
POTASSIUM: 3.4 meq/L — AB (ref 3.5–5.1)
SODIUM: 138 meq/L (ref 135–145)
Total Bilirubin: 0.6 mg/dL (ref 0.2–1.2)
Total Protein: 6.8 g/dL (ref 6.0–8.3)

## 2017-07-19 LAB — LDL CHOLESTEROL, DIRECT: LDL DIRECT: 98 mg/dL

## 2017-07-19 LAB — HEMOGLOBIN A1C: HEMOGLOBIN A1C: 5.6 % (ref 4.6–6.5)

## 2017-07-19 MED ORDER — MECLIZINE HCL 12.5 MG PO TABS: 12.5000 mg | ORAL_TABLET | Freq: Four times a day (QID) | ORAL | 0 refills | Status: DC | PRN

## 2017-07-19 NOTE — Progress Notes (Signed)
   Subjective:    Patient ID: Victoria Bird, female    DOB: 04/28/1946, 71 y.o.   MRN: [id number]  HPI The patient is a 71 YO female coming in for several concerns including chronic pain (she is suffering from pain in her back, hands, legs, "everywhere", has seen pain management but feels that they did not give her anything but wanted her to come back for a nerve test for her right hand, has been on vicodin in the past which was effective, has tried gabapentin in the past which gave her side effects and states that she cannot take things that affect the nerves, has not been back to orthopedics as recommended), and her vertigo (started after surgery in the summer and was given meclizine, she is using this rarely, happens several times per month, usually with moving her head or bending down, rarely happens without a trigger), and her diabetes (needs HgA1c, taking metformin and ARB, denies new complications, denies high or low sugars, denies numbness in her feet).   Review of Systems  Constitutional: Positive for activity change. Negative for appetite change, fatigue, fever and unexpected weight change.  HENT: Negative.   Eyes: Negative.   Respiratory: Negative for cough, chest tightness and shortness of breath.   Cardiovascular: Negative for chest pain, palpitations and leg swelling.  Gastrointestinal: Negative for abdominal distention, abdominal pain, constipation, diarrhea, nausea and vomiting.  Musculoskeletal: Positive for arthralgias, back pain and myalgias.  Skin: Negative.   Neurological: Positive for dizziness. Negative for tremors, seizures, weakness, light-headedness, numbness and headaches.  Psychiatric/Behavioral: Negative.       Objective:   Physical Exam  Constitutional: She is oriented to person, place, and time. She appears well-developed and well-nourished.  HENT:  Head: Normocephalic and atraumatic.  Eyes: EOM are normal.  Neck: Normal range of motion.  Cardiovascular:  Normal rate and regular rhythm.   Pulmonary/Chest: Effort normal and breath sounds normal. No respiratory distress. She has no wheezes. She has no rales.  Abdominal: Soft. Bowel sounds are normal. She exhibits no distension. There is no tenderness. There is no rebound.  Musculoskeletal: She exhibits tenderness. She exhibits no edema.  Neurological: She is alert and oriented to person, place, and time. Coordination normal.  Skin: Skin is warm and dry.  Psychiatric: She has a normal mood and affect.   Vitals:   07/19/17 1427  BP: 136/72  Pulse: 80  Resp: 20  Temp: 97.8 F (36.6 C)  SpO2: 95%  Weight: 155 lb (70.3 kg)  Height: 5\' 3"  (1.6 m)      Assessment & Plan:

## 2017-07-19 NOTE — Patient Instructions (Addendum)
Continue doing brain stimulating activities (puzzles, reading, adult coloring books, staying active) to keep memory sharp.   Continue to eat heart healthy diet (full of fruits, vegetables, whole grains, lean protein, water--limit salt, fat, and sugar intake) and increase physical activity as tolerated.  1-800-quitnow   Victoria Bird , Thank you for taking time to come for your Medicare Wellness Visit. I appreciate your ongoing commitment to your health goals. Please review the following plan we discussed and let me know if I can assist you in the future.   These are the goals we discussed: Goals    . Increase my physical activity          Each day move around the house and yard a little bit more       This is a list of the screening recommended for you and due dates:  Health Maintenance  Topic Date Due  .  Hepatitis C: One time screening is recommended by Center for Disease Control  (CDC) for  adults born from [birth year] through 1964.   [birth year]  . Complete foot exam   03/19/1957  . DEXA scan (bone density measurement)  03/19/2012  . Flu Shot  05/02/2017  . Hemoglobin A1C  06/18/2017  . Eye exam for diabetics  01/31/2018  . Mammogram  02/02/2019  . Colon Cancer Screening  09/05/2022  . Tetanus Vaccine  01/24/2026  . Pneumonia vaccines  Completed     Influenza Virus Vaccine injection What is this medicine? INFLUENZA VIRUS VACCINE (in floo EN zuh VAHY ruhs vak SEEN) helps to reduce the risk of getting influenza also known as the flu. The vaccine only helps protect you against some strains of the flu. This medicine may be used for other purposes; ask your health care provider or pharmacist if you have questions. COMMON BRAND NAME(S): Afluria, Agriflu, Alfuria, FLUAD, Fluarix, Fluarix Quadrivalent, Flublok, Flublok Quadrivalent, FLUCELVAX, Flulaval, Fluvirin, Fluzone, Fluzone High-Dose, Fluzone Intradermal What should I tell my health care provider before I take this medicine? They need  to know if you have any of these conditions: -bleeding disorder like hemophilia -fever or infection -Guillain-Barre syndrome or other neurological problems -immune system problems -infection with the human immunodeficiency virus (HIV) or AIDS -low blood platelet counts -multiple sclerosis -an unusual or allergic reaction to influenza virus vaccine, latex, other medicines, foods, dyes, or preservatives. Different brands of vaccines contain different allergens. Some may contain latex or eggs. Talk to your doctor about your allergies to make sure that you get the right vaccine. -pregnant or trying to get pregnant -breast-feeding How should I use this medicine? This vaccine is for injection into a muscle or under the skin. It is given by a health care professional. A copy of Vaccine Information Statements will be given before each vaccination. Read this sheet carefully each time. The sheet may change frequently. Talk to your healthcare provider to see which vaccines are right for you. Some vaccines should not be used in all age groups. Overdosage: If you think you have taken too much of this medicine contact a poison control center or emergency room at once. NOTE: This medicine is only for you. Do not share this medicine with others. What if I miss a dose? This does not apply. What may interact with this medicine? -chemotherapy or radiation therapy -medicines that lower your immune system like etanercept, anakinra, infliximab, and adalimumab -medicines that treat or prevent blood clots like warfarin -phenytoin -steroid medicines like prednisone or cortisone -theophylline -vaccines This list  may not describe all possible interactions. Give your health care provider a list of all the medicines, herbs, non-prescription drugs, or dietary supplements you use. Also tell them if you smoke, drink alcohol, or use illegal drugs. Some items may interact with your medicine. What should I watch for while  using this medicine? Report any side effects that do not go away within 3 days to your doctor or health care professional. Call your health care provider if any unusual symptoms occur within 6 weeks of receiving this vaccine. You may still catch the flu, but the illness is not usually as bad. You cannot get the flu from the vaccine. The vaccine will not protect against colds or other illnesses that may cause fever. The vaccine is needed every year. What side effects may I notice from receiving this medicine? Side effects that you should report to your doctor or health care professional as soon as possible: -allergic reactions like skin rash, itching or hives, swelling of the face, lips, or tongue Side effects that usually do not require medical attention (report to your doctor or health care professional if they continue or are bothersome): -fever -headache -muscle aches and pains -pain, tenderness, redness, or swelling at the injection site -tiredness This list may not describe all possible side effects. Call your doctor for medical advice about side effects. You may report side effects to FDA at 1-800-FDA-1088. Where should I keep my medicine? The vaccine will be given by a health care professional in a clinic, pharmacy, doctor's office, or other health care setting. You will not be given vaccine doses to store at home. NOTE: This sheet is a summary. It may not cover all possible information. If you have questions about this medicine, talk to your doctor, pharmacist, or health care provider.  2018 Elsevier/Gold Standard (2015-04-09 10:07:28)

## 2017-07-19 NOTE — Progress Notes (Signed)
Patient ID: Victoria Bird, female   DOB: 20-Jan-1946, 71 y.o.   MRN: [id number] Medical screening examination/treatment/procedure(s) were performed by non-physician practitioner and as supervising physician I was immediately available for consultation/collaboration. I agree with above. Hoyt Koch, MD

## 2017-07-20 NOTE — Assessment & Plan Note (Signed)
Offered her several options including lidoderm patches, cymbalta but she is not interested in this. She is wanting vicodin which is not prescribed today. She is advised to return to pain management.

## 2017-07-20 NOTE — Assessment & Plan Note (Signed)
Foot exam done, checking HgA1c and CMP and lipid panel. Taking metfomin and on ARB. Not complicated.

## 2017-07-20 NOTE — Assessment & Plan Note (Signed)
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

## 2017-07-23 ENCOUNTER — Ambulatory Visit: Payer: Medicare Other | Admitting: Pulmonary Disease

## 2017-07-23 ENCOUNTER — Ambulatory Visit: Payer: Medicare Other

## 2017-07-24 ENCOUNTER — Ambulatory Visit (INDEPENDENT_AMBULATORY_CARE_PROVIDER_SITE_OTHER)
Admission: RE | Admit: 2017-07-24 | Discharge: 2017-07-24 | Disposition: A | Discharge status: Home / Self Care | Payer: Medicare Other | Source: Ambulatory Visit | Attending: Internal Medicine | Admitting: Internal Medicine

## 2017-07-24 DIAGNOSIS — E2839 Other primary ovarian failure: Secondary | ICD-10-CM | POA: Diagnosis not present

## 2017-07-27 ENCOUNTER — Telehealth: Payer: Self-pay

## 2017-07-27 NOTE — Telephone Encounter (Signed)
Pt has been informed and expressed understanding. She stated she is hesitant of starting a new medication but she will research it and see if she see any alarming side effects and will call back if she doesn't want to proceed.

## 2017-07-27 NOTE — Telephone Encounter (Signed)
-----   Message from Hoyt Koch, MD sent at 07/26/2017  5:18 PM EDT ----- Please call and let her know that she does have osteoporosis or weak bones. We would recommend to start a medicine to treat her bones to help make them stronger called boniva which she would take 1 pill weekly.

## 2017-07-30 ENCOUNTER — Encounter: Payer: Self-pay | Admitting: Pulmonary Disease

## 2017-07-30 ENCOUNTER — Ambulatory Visit (INDEPENDENT_AMBULATORY_CARE_PROVIDER_SITE_OTHER): Payer: Medicare Other | Admitting: *Deleted

## 2017-07-30 ENCOUNTER — Ambulatory Visit (INDEPENDENT_AMBULATORY_CARE_PROVIDER_SITE_OTHER): Payer: Medicare Other | Admitting: Pulmonary Disease

## 2017-07-30 VITALS — BP 144/70 | HR 87 | Ht 63.0 in | Wt 155.0 lb

## 2017-07-30 DIAGNOSIS — K219 Gastro-esophageal reflux disease without esophagitis: Secondary | ICD-10-CM

## 2017-07-30 DIAGNOSIS — F17200 Nicotine dependence, unspecified, uncomplicated: Secondary | ICD-10-CM

## 2017-07-30 DIAGNOSIS — J449 Chronic obstructive pulmonary disease, unspecified: Secondary | ICD-10-CM

## 2017-07-30 DIAGNOSIS — F17210 Nicotine dependence, cigarettes, uncomplicated: Secondary | ICD-10-CM

## 2017-07-30 NOTE — Progress Notes (Signed)
SIX MIN WALK 07/30/2017 10/20/2015 06/22/2015  Medications hyzaar, Metformin at 7am Meformin, Albuterol (9am, 11am) -  Supplimental Oxygen during Test? (L/min) No No No  Laps 3 5 -  Partial Lap (in Meters) 48 0 -  Baseline BP (sitting) 146/72 102/72 -  Baseline Heartrate 74 86 -  Baseline Dyspnea (Borg Scale) 1 5 -  Baseline Fatigue (Borg Scale) 2 6 -  Baseline SPO2 97 94 -  BP (sitting) 164/70 130/80 -  Heartrate 104 104 -  Dyspnea (Borg Scale) 3 7 -  Fatigue (Borg Scale) 3 7 -  SPO2 93 90 -  BP (sitting) 158/72 120/76 -  Heartrate 84 84 -  SPO2 97 94 -  Stopped or Paused before Six Minutes No Yes -  Other Symptoms at end of Exercise - Unsteady Gait >> she was swaying back and forth as she was walking up and down the hall. -  Distance Completed 192 240 -  Tech Comments: TA/CMA - once pt was seated, she recovered to 90% RA.

## 2017-07-30 NOTE — Patient Instructions (Addendum)
   Continue using your Spiriva and inhalers as prescribed.  Use the sample Spiriva we are giving you today by doing 2 twists/inhalations once daily. If you like this device more than the capsule let me know and we will send you in a prescription.   We will see you back in 6 months or sooner if needed.

## 2017-07-30 NOTE — Progress Notes (Signed)
Subjective:    Patient ID: Victoria Bird, female    DOB: [birth year], 71 y.o.   MRN: [id number]  C.C.:  Follow-up For Severe COPD, GERD, Tobacco Use Disorder, & OSA.  HPI  Previously hospitalized in June for COPD exacerbation and acute hypoxic respiratory failure. Last seen in July.  Severe COPD: Previously on Advair & Spiriva. Patient subsequently transition from Advair to Symbicort but after hospitalization she was prescribed Pulmicort twice daily in her nebulizer. Patient was continued on Pulmicort 0.25 mg nebulized twice daily at last appointment. She is still using her rescue inhaler once daily at least. No exacerbations since last appointment. Still having varying dyspnea on exertion. She has had a mild cough & wheeze, primarily at night this last week after her Flu Vaccine. She has been using Mucinex DM with some relief. She is out of her Spiriva. She reports she hasn't felt like she needed it so she hasn't been using her Pulmicort twice daily.   GERD: Follows with GI. Previously prescribed Protonix as well as Pepcid but was not using Pepcid at last appointment. No reflux or dyspepsia. No morning brash water taste. Still has ongoing, chronic abdominal pain.   Tobacco use disorder: At last appointment patient was still smoking intermittently. She identified multiple psychosocial stressors. She is still smoking intermittently. She has been using an e-cigarette to help her quit.   OSA: Last polysomnogram was 2010. Previously declined CPAP therapy due to claustrophobia.  Review of Systems She reports she continues to have trouble sleeping at night. Still has ongoing pain. No fever, chills, or sweats recently. No chest pain, pressure or tightness. She is still having "dizzy spells" and nausea that is being treated with meclizine.   Allergies  Allergen Reactions  . Aspartame And Phenylalanine Nausea And Vomiting    Patient says she is allergic to all artificial sweeteners  . Doxycycline  Other (See Comments)    Nausea, vomiting, HA, double vision  . Tramadol Shortness Of Breath and Nausea Only  . Codeine Itching    Has not tried benadryl to alleviate side effects  . Neurontin [Gabapentin] Other (See Comments)    Dizzy, "drugged" feeling, sleepy  . Sulfa Antibiotics Other (See Comments)    Kidney problem   . Symbicort [Budesonide-Formoterol Fumarate]     Swelling of face and inside of mouth per pt  . Penicillins Rash    Has patient had a PCN reaction causing immediate rash, facial/tongue/throat swelling, SOB or lightheadedness with hypotension: No Has patient had a PCN reaction causing severe rash involving mucus membranes or skin necrosis: No Has patient had a PCN reaction that required hospitalization No Has patient had a PCN reaction occurring within the last 10 years: No If all of the above answers are "NO", then may proceed with Cephalosporin use.  . Prednisone Rash    Patient stated she received Prednisone while she was in the hospital and experienced a rash and "extreme pain.'   Current Outpatient Prescriptions on File Prior to Visit  Medication Sig Dispense Refill  . albuterol (ACCUNEB) 0.63 MG/3ML nebulizer solution Take 1 ampule by nebulization every 6 (six) hours as needed for wheezing or shortness of breath.    Marland Kitchen albuterol (PROAIR HFA) 108 (90 Base) MCG/ACT inhaler Inhale 2 puffs into the lungs every 6 (six) hours as needed for wheezing or shortness of breath. 1 Inhaler 5  . budesonide (PULMICORT) 0.25 MG/2ML nebulizer solution Take 2 mLs (0.25 mg total) by nebulization 2 (two) times daily. [place]  mL 1  . famotidine (PEPCID) 20 MG tablet Take 1 tablet (20 mg total) by mouth 2 (two) times daily. 60 tablet 0  . fluticasone (FLONASE) 50 MCG/ACT nasal spray Place 1 spray into both nostrils daily. 16 g 0  . lactose free nutrition (BOOST PLUS) LIQD Take 237 mLs by mouth 3 (three) times daily with meals. 90 Can 0  . losartan-hydrochlorothiazide (HYZAAR) 100-12.5 MG tablet  TAKE 1 TABLET BY MOUTH DAILY 90 tablet 0  . meclizine (ANTIVERT) 12.5 MG tablet Take 1 tablet (12.5 mg total) by mouth every 6 (six) hours as needed for dizziness or nausea. 30 tablet 0  . metFORMIN (GLUCOPHAGE) 500 MG tablet TAKE 1 TABLET(500 MG) BY MOUTH TWICE DAILY WITH A MEAL 90 tablet 1  . pantoprazole (PROTONIX) 40 MG tablet Take 1 tablet (40 mg total) by mouth daily. 30 tablet 0  . tiotropium (SPIRIVA HANDIHALER) 18 MCG inhalation capsule Place 1 capsule (18 mcg total) into inhaler and inhale daily. 30 capsule 5   No current facility-administered medications on file prior to visit.    Past Medical History:  Diagnosis Date  . Anxiety   . Chronic pain   . Colon polyps    adenomatous  . COPD (chronic obstructive pulmonary disease) ([place])   . Diabetes mellitus   . Diverticulitis   . Diverticulosis   . Emphysema lung ([place])   . Gall stones   . GERD (gastroesophageal reflux disease)   . H/O: pneumonia   . High cholesterol   . Hypertension   . OSA (obstructive sleep apnea)    no cpap  . Tracheobronchitis 01/01/2012   Past Surgical History:  Procedure Laterality Date  . ABDOMINAL HYSTERECTOMY    . APPENDECTOMY    . Bilateral shoulder surgery    . CARPAL TUNNEL RELEASE Left   . CATARACT EXTRACTION    . CHOLECYSTECTOMY    . COLONOSCOPY    . ESOPHAGOGASTRODUODENOSCOPY    . FRACTURE SURGERY    . Right knee surgery    . ROTATOR CUFF REPAIR     Family History  Problem Relation Age of Onset  . Heart attack Mother   . Diabetes Maternal Grandfather   . Heart attack Maternal Grandfather   . Hypertension Maternal Grandfather   . Stroke Maternal Grandfather   . Heart attack Maternal Grandmother   . Hypertension Maternal Grandmother   . Alcohol abuse Father   . Thyroid disease Neg Hx   . Lung disease Neg Hx   . Colon cancer Neg Hx   . Breast cancer Neg Hx    Social History   Social History  . Marital status: Divorced    Spouse name: N/A  . Number of children: 3  . Years  of education: GED   Occupational History  . Disability    Social History Main Topics  . Smoking status: Current Some Day Smoker    Packs/day: 1.50    Years: 41.00    Types: Cigarettes    Start date: 03/19/1974  . Smokeless tobacco: Never Used     Comment: daily amount of cigarettes varies depending on stress level   . Alcohol use No  . Drug use: No  . Sexual activity: No   Other Topics Concern  . None   Social History Narrative   Daughter Edmonia Lynch) [phone]   Originally from [place]. Previously lived in [place]. She has traveled from [facility] to [place] & has also traveled across country to [place]. Multiple visits to [place], [place],  [place], [place], [place]. She has a dog at home. No bird, mold, or hot tub exposure. She reports some years ago she did live in a home that had black mold in her closet. 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       Pt lives with her youngest daughter, lives in a trailer, does have stairs into home. Does have some troubled with that. Highest level of education is GED.       Objective:   Physical Exam BP (!) 144/70 (BP Location: Left Arm, Cuff Size: Normal)   Pulse 87   Ht 5\' 3"  (1.6 m)   Wt 155 lb (70.3 kg)   SpO2 93%   BMI 27.46 kg/m   General:  Awake. Elderly female. No distress. Integument:  No rash. Warm. Dry. Extremities:  No cyanosis or clubbing.  HEENT:  No nasal turbinate swelling. No scleral icterus. Moist membranes Cardiovascular:  Regular rate. No edema. Regular rhythm.  Pulmonary:  Symmetrically decreased breath sounds. Normal work of breathing on room air. Abdomen: Soft. Normal bowel sounds. Nondistended.  Musculoskeletal:  Normal bulk and tone. No joint deformity or effusion appreciated. Neurological:  Cranial nerves 2-12 grossly in tact. No meningismus. Moving all 4 extremities equally.   PFT 01/28/16: FVC 2.31 L  (77%) FEV1 1.16 L (81%) FEV1/FVC 0.50 FEF 25-75 0.47 L (24%) no bronchodilator response 06/09/15: FVC 2.21 L (73%) FEV1 1.08 L (47%) FEV1/FVC 0.49 FEF 25-75 0.43 L (22%) no bronchodilator response TLC 5.26 L (105%) RV 135% ERV 87% DLCO corrected 36% (Hgb 16.2)  6MWT 07/30/17:  Walked 192 meters / Baseline Sat 97% on RA / Nadir Sat 93% on RA 10/20/15:  Walked 240 meters / Baseline Sat 94% on RA / Nadir Sat 90% on RA 06/22/15:  3 laps around office. Baseline 93% on RA. Nadir Sat 89% on RA. 06/18/15:  Walked in hospital by PT / Baseline Sat 90% on RA / Nadir Sat 86% on RA (required 3 L/m on exertion to maintain)  IMAGING CXR PA/LAT 03/19/16 (previously reviewed by me):  No parenchymal mass or opacity appreciated. No pleural effusion. Heart normal in size & mediastinum normal in contour.   CXR PA/LAT 06/16/16 (previously reviewed by me):  No focal opacity. No effusion. Normal heart size. Normal mediastinal contour.   CXR PA/LAT 10/08/14 (previously reviewed by me): Mild persistent linear opacity consistent with scar at the right lung base. Kyphosis noted. Hyperinflation with some flattening of the diaphragm. No pleural effusion or thickening appreciated. No parenchymal opacity or nodule appreciated. Heart normal in size. Mediastinum: Contour.  CT CHEST W/ 04/16/02 (per radiologist): No evidence for pulmonary emboli. No pathologically enlarged mediastinal or hilar lymph nodes. Enlarged lower pole of left thyroid lobe with a nodular configuration. Mild water density alveolitis with an anterior portion of left upper lobe & medial anterior portion of right upper lobe. No parenchymal nodules.   CARDIAC TTE (01/08/13): LV normal in size. EF 60-65%. Normal wall motion without regional abnormality. LA & RA normal in size. RV normal in size and function. Pulmonary artery systolic pressure 43 mmHg. Trivial aortic regurgitation. No mitral stenosis or regurgitation. No pulmonic or tricuspid regurgitation. No pericardial  effusion.  LABS 05/27/15 Alpha-1 antitrypsin: 164  04/29/15 BMP: 140/4.2/102/24/16/1.01/93/9 LFT: 4/6.7/0.7/67/14/13  10/09/14 VBG (on room air): 7.39/37/59  03/24/13 ANA: Negative  RF:  <7.0    Assessment &  Plan:  71 y.o. female with ongoing tobacco use and underlying severe COPD as well as reflux. Patient's walk test today was reviewed and shows no significant desaturation or evidence of hypoxia that would warrant oxygen therapy. Her reflux is well-controlled with her current regimen and therefore warrants no further adjustment. She may benefit from trying a Respimat device in place of HandiHaler and will be given a sample today. I instructed the patient to notify our office if she had new breathing problems or questions before next appointment.  1. Severe COPD:  Patient to try Spiriva Respimat in place of HandiHaler. Continuing albuterol rescue medication as needed. 2. GERD:  Controlled with current regimen. No new medications. 3. Tobacco use disorder: Counseled for over 3 minutes and need for complete tobacco cessation. 4. Health maintenance: Status post High Dose Flu Vaccine October 2018, Pneumovax April 2017, Prevnar July 2018, & Tdap April 2017. 5. Follow-up: Return to clinic in 6 months or sooner if needed.  Sonia Baller Ashok Cordia, M.D. [facility] Pager:  [phone] After 3pm or if no response, call [phone] 3:02 PM 07/30/17

## 2017-08-30 ENCOUNTER — Telehealth: Payer: Self-pay | Admitting: Internal Medicine

## 2017-08-30 ENCOUNTER — Other Ambulatory Visit: Payer: Self-pay

## 2017-08-30 MED ORDER — LANCETS MISC: 0 refills | Status: DC

## 2017-08-30 MED ORDER — BLOOD GLUCOSE MONITOR KIT: PACK | 0 refills | Status: DC

## 2017-08-30 MED ORDER — GLUCOSE BLOOD VI STRP: ORAL_STRIP | 0 refills | Status: DC

## 2017-08-30 NOTE — Telephone Encounter (Signed)
Sent to pharmacy 

## 2017-08-30 NOTE — Telephone Encounter (Signed)
Copied from [place]. Topic: Quick Communication - See Telephone Encounter >> Aug 30, 2017  1:52 PM Burnis Medin, NT wrote: CRM for notification. See Telephone encounter for: Pt. Is calling to see if the doctor could put an order in for another glucose meter. Pt dropped her old one and cracked the screen. Also pt said insurance will only pay for accu check nano smart vue brand. Pt uses [facility] on [address].   08/30/17.

## 2017-10-16 ENCOUNTER — Other Ambulatory Visit: Payer: Self-pay | Admitting: Internal Medicine

## 2017-11-02 ENCOUNTER — Telehealth: Payer: Self-pay | Admitting: Pulmonary Disease

## 2017-11-02 ENCOUNTER — Telehealth: Payer: Self-pay | Admitting: Endocrinology

## 2017-11-02 MED ORDER — ALBUTEROL SULFATE HFA 108 (90 BASE) MCG/ACT IN AERS: 2.0000 | INHALATION_SPRAY | Freq: Four times a day (QID) | RESPIRATORY_TRACT | 5 refills | Status: AC | PRN

## 2017-11-02 NOTE — Telephone Encounter (Signed)
Called and spoke with pt verifying pt's pharmacy and med she was needing a refill of.  Information verified by pt.  Refill sent to pt's preferred pharmacy.  Nothing further needed at this current time.

## 2017-11-02 NOTE — Telephone Encounter (Signed)
Patient is retuning a call

## 2017-11-02 NOTE — Telephone Encounter (Signed)
Did not call this patient

## 2017-11-05 ENCOUNTER — Encounter: Payer: Self-pay | Admitting: Endocrinology

## 2017-11-05 ENCOUNTER — Ambulatory Visit (INDEPENDENT_AMBULATORY_CARE_PROVIDER_SITE_OTHER): Payer: Medicare Other | Admitting: Endocrinology

## 2017-11-05 VITALS — BP 169/86 | HR 88 | Ht 63.0 in | Wt 162.2 lb

## 2017-11-05 DIAGNOSIS — E042 Nontoxic multinodular goiter: Secondary | ICD-10-CM

## 2017-11-05 LAB — T4, FREE: Free T4: 1.03 ng/dL (ref 0.60–1.60)

## 2017-11-05 LAB — TSH: TSH: 2.19 u[IU]/mL (ref 0.35–4.50)

## 2017-11-05 NOTE — Progress Notes (Signed)
Patient ID: Victoria Bird, female   DOB: 07/17/1946, 72 y.o.   MRN: [id number]    Reason for Appointment: Follow-up of thyroid nodule    History of Present Illness:   The patient's thyroid enlargement was first discovered in 06/2014 when she was having a CT scan of her neck.  She  had a thyroid ultrasound showing the following: Dominant left nodule is inferior measuring 3.4 cm x 3.4 cm x 4.8 cm.  Aspiration biopsy of the nodule in 6/16 showed atypia of undetermined significance/follicular lesion, BETHESDA category 3 but also had abundant colloid Although she was referred for surgery this appointment was delayed significantly and she was reluctant to have surgery also She came for follow-up several months later and it was decided to recheck her ultrasound rather than repeat biopsy  The ultrasound done in 08/2015 showed the following:  Similar findings of multi nodular goiter. No new or enlarging thyroid nodules.  Specifically, the dominant previously biopsied approximately 4.8 cm nodule / mass within the inferior aspect of the left lobe of the thyroid is unchanged since the 01/2015 examination.   Recent history:  She is coming back now for her annual follow-up Currently does not complain of any difficulty swallowing or choking and no difficulty with breathing in any position She had a follow-up ultrasound done in 11/2016 which showed the nodule to be 4.2 cm on the left lobe, smaller than before   DIABETES:   She has a history of mild Diabetes which has been well controlled and followed by PCP   Currently she is on  Metformin 500 twice a day    At home sugar range at various times 105-140  Lab Results  Component Value Date   HGBA1C 5.6 07/19/2017   HGBA1C 5.7 (H) 12/16/2016   HGBA1C 5.7 10/24/2016   Lab Results  Component Value Date   MICROALBUR <0.7 07/23/2015   CREATININE 1.33 (H) 07/19/2017      Allergies as of 11/05/2017      Reactions   Aspartame And  Phenylalanine Nausea And Vomiting   Patient says she is allergic to all artificial sweeteners   Doxycycline Other (See Comments)   Nausea, vomiting, HA, double vision   Tramadol Shortness Of Breath, Nausea Only   Codeine Itching   Has not tried benadryl to alleviate side effects   Neurontin [gabapentin] Other (See Comments)   Dizzy, "drugged" feeling, sleepy   Sulfa Antibiotics Other (See Comments)   Kidney problem    Symbicort [budesonide-formoterol Fumarate]    Swelling of face and inside of mouth per pt   Penicillins Rash   Has patient had a PCN reaction causing immediate rash, facial/tongue/throat swelling, SOB or lightheadedness with hypotension: No Has patient had a PCN reaction causing severe rash involving mucus membranes or skin necrosis: No Has patient had a PCN reaction that required hospitalization No Has patient had a PCN reaction occurring within the last 10 years: No If all of the above answers are "NO", then may proceed with Cephalosporin use.   Prednisone Rash   Patient stated she received Prednisone while she was in the hospital and experienced a rash and "extreme pain.'      Medication List        Accurate as of 11/05/17  3:00 PM. Always use your most recent med list.          albuterol 0.63 MG/3ML nebulizer solution Commonly known as:  ACCUNEB Take 1 ampule by nebulization every 6 (six) hours  as needed for wheezing or shortness of breath.   albuterol 108 (90 Base) MCG/ACT inhaler Commonly known as:  PROAIR HFA Inhale 2 puffs into the lungs every 6 (six) hours as needed for wheezing or shortness of breath.   blood glucose meter kit and supplies Kit Use to test blood sugar   fluticasone 50 MCG/ACT nasal spray Commonly known as:  FLONASE Place 1 spray into both nostrils daily.   glucose blood test strip Commonly known as:  IGLUCOSE TEST STRIPS Use as instructed   lactose free nutrition Liqd Take 237 mLs by mouth 3 (three) times daily with meals.     Lancets Misc Use to test blood sugar one daily   losartan-hydrochlorothiazide 100-12.5 MG tablet Commonly known as:  HYZAAR TAKE 1 TABLET BY MOUTH DAILY   metFORMIN 500 MG tablet Commonly known as:  GLUCOPHAGE TAKE 1 TABLET(500 MG) BY MOUTH TWICE DAILY WITH A MEAL   pantoprazole 40 MG tablet Commonly known as:  PROTONIX Take 1 tablet (40 mg total) by mouth daily.   tiotropium 18 MCG inhalation capsule Commonly known as:  SPIRIVA HANDIHALER Place 1 capsule (18 mcg total) into inhaler and inhale daily.       Allergies:  Allergies  Allergen Reactions  . Aspartame And Phenylalanine Nausea And Vomiting    Patient says she is allergic to all artificial sweeteners  . Doxycycline Other (See Comments)    Nausea, vomiting, HA, double vision  . Tramadol Shortness Of Breath and Nausea Only  . Codeine Itching    Has not tried benadryl to alleviate side effects  . Neurontin [Gabapentin] Other (See Comments)    Dizzy, "drugged" feeling, sleepy  . Sulfa Antibiotics Other (See Comments)    Kidney problem   . Symbicort [Budesonide-Formoterol Fumarate]     Swelling of face and inside of mouth per pt  . Penicillins Rash    Has patient had a PCN reaction causing immediate rash, facial/tongue/throat swelling, SOB or lightheadedness with hypotension: No Has patient had a PCN reaction causing severe rash involving mucus membranes or skin necrosis: No Has patient had a PCN reaction that required hospitalization No Has patient had a PCN reaction occurring within the last 10 years: No If all of the above answers are "NO", then may proceed with Cephalosporin use.  . Prednisone Rash    Patient stated she received Prednisone while she was in the hospital and experienced a rash and "extreme pain.'    Past Medical History:  Diagnosis Date  . Anxiety   . Chronic pain   . Colon polyps    adenomatous  . COPD (chronic obstructive pulmonary disease) ([place])   . Diabetes mellitus   . Diverticulitis    . Diverticulosis   . Emphysema lung ([place])   . Gall stones   . GERD (gastroesophageal reflux disease)   . H/O: pneumonia   . High cholesterol   . Hypertension   . OSA (obstructive sleep apnea)    no cpap  . Tracheobronchitis 01/01/2012    Past Surgical History:  Procedure Laterality Date  . ABDOMINAL HYSTERECTOMY    . APPENDECTOMY    . Bilateral shoulder surgery    . CARPAL TUNNEL RELEASE Left   . CATARACT EXTRACTION    . CHOLECYSTECTOMY    . COLONOSCOPY    . ESOPHAGOGASTRODUODENOSCOPY    . FRACTURE SURGERY    . Right knee surgery    . ROTATOR CUFF REPAIR      Family History  Problem Relation Age  of Onset  . Heart attack Mother   . Diabetes Maternal Grandfather   . Heart attack Maternal Grandfather   . Hypertension Maternal Grandfather   . Stroke Maternal Grandfather   . Heart attack Maternal Grandmother   . Hypertension Maternal Grandmother   . Alcohol abuse Father   . Thyroid disease Neg Hx   . Lung disease Neg Hx   . Colon cancer Neg Hx   . Breast cancer Neg Hx     Social History:  reports that she has been smoking cigarettes.  She started smoking about 43 years ago. She has a 61.50 pack-year smoking history. she has never used smokeless tobacco. She reports that she does not drink alcohol or use drugs.    ROS            Examination:   BP (!) 169/86 (BP Location: Left Arm, Patient Position: Sitting, Cuff Size: Large)   Pulse 88   Ht '5\' 3"'  (1.6 m)   Wt 162 lb 3.2 oz (73.6 kg)   BMI 28.73 kg/m             The thyroid is not distinct, only indistinct fullness present on the left side Right side not enlarged No stridor present  No lymphadenopathy in the neck Biceps reflexes appear normal  Assessment/Plan:  History of left-sided 4.8 cm nodule which showed atypia on biopsy in 6/16 Follow-up ultrasound previously showed this to be now 4.2 cm about a year ago  She is not symptomatic Again her  nodule is difficult to palpate clinically   She does  not need further follow-up with ultrasound since her nodule is clearly not growing and also smaller than before We will follow her clinically   Elayne Snare 11/05/2017

## 2017-11-07 ENCOUNTER — Inpatient Hospital Stay (HOSPITAL_COMMUNITY)
Admission: EM | Admit: 2017-11-07 | Discharge: 2017-11-20 | DRG: 190 | Disposition: A | Discharge status: Home Health | Payer: Medicare Other | Attending: Family Medicine | Admitting: Family Medicine

## 2017-11-07 ENCOUNTER — Emergency Department (HOSPITAL_COMMUNITY): Payer: Medicare Other

## 2017-11-07 ENCOUNTER — Encounter (HOSPITAL_COMMUNITY): Payer: Self-pay | Admitting: Emergency Medicine

## 2017-11-07 DIAGNOSIS — Z885 Allergy status to narcotic agent status: Secondary | ICD-10-CM

## 2017-11-07 DIAGNOSIS — T380X5A Adverse effect of glucocorticoids and synthetic analogues, initial encounter: Secondary | ICD-10-CM | POA: Diagnosis not present

## 2017-11-07 DIAGNOSIS — G4733 Obstructive sleep apnea (adult) (pediatric): Secondary | ICD-10-CM | POA: Diagnosis present

## 2017-11-07 DIAGNOSIS — G4700 Insomnia, unspecified: Secondary | ICD-10-CM | POA: Diagnosis not present

## 2017-11-07 DIAGNOSIS — Z8701 Personal history of pneumonia (recurrent): Secondary | ICD-10-CM

## 2017-11-07 DIAGNOSIS — Z720 Tobacco use: Secondary | ICD-10-CM | POA: Diagnosis present

## 2017-11-07 DIAGNOSIS — Z91048 Other nonmedicinal substance allergy status: Secondary | ICD-10-CM

## 2017-11-07 DIAGNOSIS — K219 Gastro-esophageal reflux disease without esophagitis: Secondary | ICD-10-CM | POA: Diagnosis not present

## 2017-11-07 DIAGNOSIS — I10 Essential (primary) hypertension: Secondary | ICD-10-CM | POA: Diagnosis not present

## 2017-11-07 DIAGNOSIS — E872 Acidosis: Secondary | ICD-10-CM | POA: Diagnosis not present

## 2017-11-07 DIAGNOSIS — N183 Chronic kidney disease, stage 3 unspecified: Secondary | ICD-10-CM | POA: Diagnosis present

## 2017-11-07 DIAGNOSIS — E1165 Type 2 diabetes mellitus with hyperglycemia: Secondary | ICD-10-CM | POA: Diagnosis not present

## 2017-11-07 DIAGNOSIS — Z833 Family history of diabetes mellitus: Secondary | ICD-10-CM | POA: Diagnosis not present

## 2017-11-07 DIAGNOSIS — M542 Cervicalgia: Secondary | ICD-10-CM | POA: Diagnosis not present

## 2017-11-07 DIAGNOSIS — R0602 Shortness of breath: Secondary | ICD-10-CM | POA: Diagnosis not present

## 2017-11-07 DIAGNOSIS — Z90710 Acquired absence of both cervix and uterus: Secondary | ICD-10-CM

## 2017-11-07 DIAGNOSIS — Z880 Allergy status to penicillin: Secondary | ICD-10-CM

## 2017-11-07 DIAGNOSIS — J9621 Acute and chronic respiratory failure with hypoxia: Secondary | ICD-10-CM | POA: Diagnosis not present

## 2017-11-07 DIAGNOSIS — J441 Chronic obstructive pulmonary disease with (acute) exacerbation: Secondary | ICD-10-CM | POA: Diagnosis present

## 2017-11-07 DIAGNOSIS — R079 Chest pain, unspecified: Secondary | ICD-10-CM | POA: Diagnosis not present

## 2017-11-07 DIAGNOSIS — F17210 Nicotine dependence, cigarettes, uncomplicated: Secondary | ICD-10-CM | POA: Diagnosis present

## 2017-11-07 DIAGNOSIS — Z9049 Acquired absence of other specified parts of digestive tract: Secondary | ICD-10-CM

## 2017-11-07 DIAGNOSIS — Z888 Allergy status to other drugs, medicaments and biological substances status: Secondary | ICD-10-CM | POA: Diagnosis not present

## 2017-11-07 DIAGNOSIS — J9622 Acute and chronic respiratory failure with hypercapnia: Secondary | ICD-10-CM | POA: Diagnosis not present

## 2017-11-07 DIAGNOSIS — Z9842 Cataract extraction status, left eye: Secondary | ICD-10-CM

## 2017-11-07 DIAGNOSIS — I129 Hypertensive chronic kidney disease with stage 1 through stage 4 chronic kidney disease, or unspecified chronic kidney disease: Secondary | ICD-10-CM | POA: Diagnosis not present

## 2017-11-07 DIAGNOSIS — Y92230 Patient room in hospital as the place of occurrence of the external cause: Secondary | ICD-10-CM | POA: Diagnosis not present

## 2017-11-07 DIAGNOSIS — Z9841 Cataract extraction status, right eye: Secondary | ICD-10-CM

## 2017-11-07 DIAGNOSIS — Z8249 Family history of ischemic heart disease and other diseases of the circulatory system: Secondary | ICD-10-CM | POA: Diagnosis not present

## 2017-11-07 DIAGNOSIS — I714 Abdominal aortic aneurysm, without rupture: Secondary | ICD-10-CM | POA: Diagnosis present

## 2017-11-07 DIAGNOSIS — G8929 Other chronic pain: Secondary | ICD-10-CM | POA: Diagnosis present

## 2017-11-07 DIAGNOSIS — E1121 Type 2 diabetes mellitus with diabetic nephropathy: Secondary | ICD-10-CM | POA: Diagnosis not present

## 2017-11-07 DIAGNOSIS — D72829 Elevated white blood cell count, unspecified: Secondary | ICD-10-CM | POA: Diagnosis not present

## 2017-11-07 DIAGNOSIS — N189 Chronic kidney disease, unspecified: Secondary | ICD-10-CM | POA: Diagnosis not present

## 2017-11-07 DIAGNOSIS — Z882 Allergy status to sulfonamides status: Secondary | ICD-10-CM

## 2017-11-07 DIAGNOSIS — Z7984 Long term (current) use of oral hypoglycemic drugs: Secondary | ICD-10-CM

## 2017-11-07 DIAGNOSIS — E1122 Type 2 diabetes mellitus with diabetic chronic kidney disease: Secondary | ICD-10-CM | POA: Diagnosis present

## 2017-11-07 DIAGNOSIS — R05 Cough: Secondary | ICD-10-CM | POA: Diagnosis not present

## 2017-11-07 DIAGNOSIS — I120 Hypertensive chronic kidney disease with stage 5 chronic kidney disease or end stage renal disease: Secondary | ICD-10-CM | POA: Diagnosis not present

## 2017-11-07 DIAGNOSIS — E119 Type 2 diabetes mellitus without complications: Secondary | ICD-10-CM

## 2017-11-07 DIAGNOSIS — R509 Fever, unspecified: Secondary | ICD-10-CM | POA: Diagnosis not present

## 2017-11-07 LAB — BASIC METABOLIC PANEL
Anion gap: 11 (ref 5–15)
BUN: 27 mg/dL — AB (ref 6–20)
CHLORIDE: 101 mmol/L (ref 101–111)
CO2: 26 mmol/L (ref 22–32)
Calcium: 9.1 mg/dL (ref 8.9–10.3)
Creatinine, Ser: 1.64 mg/dL — ABNORMAL HIGH (ref 0.44–1.00)
GFR calc Af Amer: 36 mL/min — ABNORMAL LOW (ref >60)
GFR calc non Af Amer: 31 mL/min — ABNORMAL LOW (ref >60)
GLUCOSE: 109 mg/dL — AB (ref 65–99)
POTASSIUM: 4.1 mmol/L (ref 3.5–5.1)
Sodium: 138 mmol/L (ref 135–145)

## 2017-11-07 LAB — CBC
HEMATOCRIT: 46.3 % — AB (ref 36.0–46.0)
HEMOGLOBIN: 15.9 g/dL — AB (ref 12.0–15.0)
MCH: 30.3 pg (ref 26.0–34.0)
MCHC: 34.3 g/dL (ref 30.0–36.0)
MCV: 88.2 fL (ref 78.0–100.0)
Platelets: 182 10*3/uL (ref 150–400)
RBC: 5.25 MIL/uL — ABNORMAL HIGH (ref 3.87–5.11)
RDW: 13.7 % (ref 11.5–15.5)
WBC: 10.5 10*3/uL (ref 4.0–10.5)

## 2017-11-07 LAB — INFLUENZA PANEL BY PCR (TYPE A & B)
INFLAPCR: NEGATIVE
INFLBPCR: NEGATIVE

## 2017-11-07 LAB — I-STAT TROPONIN, ED: Troponin i, poc: 0.01 ng/mL (ref 0.00–0.08)

## 2017-11-07 MED ORDER — BENZONATATE 100 MG PO CAPS
100.0000 mg | ORAL_CAPSULE | Freq: Once | ORAL | Status: AC
  Administered 2017-11-07: 100 mg via ORAL
  Filled 2017-11-07: qty 1

## 2017-11-07 MED ORDER — SODIUM CHLORIDE 0.9 % IV BOLUS (SEPSIS)
1000.0000 mL | Freq: Once | INTRAVENOUS | Status: AC
  Administered 2017-11-07: 1000 mL via INTRAVENOUS

## 2017-11-07 MED ORDER — ALBUTEROL SULFATE (2.5 MG/3ML) 0.083% IN NEBU
5.0000 mg | INHALATION_SOLUTION | Freq: Once | RESPIRATORY_TRACT | Status: AC
  Administered 2017-11-07: 5 mg via RESPIRATORY_TRACT
  Filled 2017-11-07: qty 6

## 2017-11-07 MED ORDER — ALBUTEROL (5 MG/ML) CONTINUOUS INHALATION SOLN
10.0000 mg/h | INHALATION_SOLUTION | Freq: Once | RESPIRATORY_TRACT | Status: AC
Start: 2017-11-07 — End: 2017-11-07
  Administered 2017-11-07: 10 mg/h via RESPIRATORY_TRACT
  Filled 2017-11-07: qty 20

## 2017-11-07 NOTE — ED Triage Notes (Signed)
Pt comes in with complaints of shortness of breath for the past few days. Hx of COPD.  Has had strong cough with little production. Saturating at 86% on RA. 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  Does endorse smoking regularly.

## 2017-11-07 NOTE — ED Provider Notes (Signed)
[facility] DEPT Provider Note   CSN: [id number] Arrival date & time: 11/07/17  1920     History   Chief Complaint Chief Complaint  Patient presents with  . Shortness of Breath  . Hypoxia    HPI Victoria Bird is a 72 y.o. female.  Complains of shortness of breath with cough productive of yellow sputum onset 3 days ago accompanied by shortness of breath.  She had subjective fever yesterday, did not take her temperature.  She also complains of chest pain abdominal pain with coughing only.  No other associated symptoms.  Treated with home nebulizer and Pro Air inhaler, without relief.  HPI  Past Medical History:  Diagnosis Date  . Anxiety   . Chronic pain   . Colon polyps    adenomatous  . COPD (chronic obstructive pulmonary disease) ([place])   . Diabetes mellitus   . Diverticulitis   . Diverticulosis   . Emphysema lung ([place])   . Gall stones   . GERD (gastroesophageal reflux disease)   . H/O: pneumonia   . High cholesterol   . Hypertension   . OSA (obstructive sleep apnea)    no cpap  . Tracheobronchitis 01/01/2012    Patient Active Problem List   Diagnosis Date Noted  . Vertigo 03/03/2017  . Fibromyalgia 01/18/2017  . Tobacco abuse 12/17/2016  . CKD (chronic kidney disease), stage III ([place]) 12/15/2016  . AAA (abdominal aortic aneurysm) without rupture ([place]) 06/08/2016  . Right-sided low back pain without sciatica   . Adjustment disorder with mixed anxiety and depressed mood 02/25/2016  . Overweight (BMI 25.0-29.9) 01/25/2016  . Allergic rhinitis 11/17/2015  . Tobacco use disorder 06/22/2015  . Diabetes mellitus type 2, controlled ([place]) 06/16/2015  . H/O recurrent pneumonia 05/27/2015  . GERD (gastroesophageal reflux disease) 05/27/2015  . Hyperlipidemia 05/27/2015  . Chronic pain syndrome 05/27/2015  . Thyroid nodule 01/20/2015  . COPD (chronic obstructive pulmonary disease) ([place]) 10/09/2014  . Hypertension 12/14/2011  . OSA  (obstructive sleep apnea) 10/02/2008    Past Surgical History:  Procedure Laterality Date  . ABDOMINAL HYSTERECTOMY    . APPENDECTOMY    . Bilateral shoulder surgery    . CARPAL TUNNEL RELEASE Left   . CATARACT EXTRACTION    . CHOLECYSTECTOMY    . COLONOSCOPY    . ESOPHAGOGASTRODUODENOSCOPY    . FRACTURE SURGERY    . Right knee surgery    . ROTATOR CUFF REPAIR    4.5 cm AAA last evaluated June 2018  OB History    No data available       Home Medications    Prior to Admission medications   Medication Sig Start Date End Date Taking? Authorizing Provider  albuterol (ACCUNEB) 0.63 MG/3ML nebulizer solution Take 1 ampule by nebulization every 6 (six) hours as needed for wheezing or shortness of breath.    [provider]  albuterol (PROAIR HFA) 108 (90 Base) MCG/ACT inhaler Inhale 2 puffs into the lungs every 6 (six) hours as needed for wheezing or shortness of breath. 11/02/17   Parrett, Fonnie Mu, NP  blood glucose meter kit and supplies KIT Use to test blood sugar 08/30/17   Hoyt Koch, MD  fluticasone [name]) 50 MCG/ACT nasal spray Place 1 spray into both nostrils daily. Patient not taking: Reported on 11/05/2017 03/21/17   Cristal Ford, DO  glucose blood (IGLUCOSE TEST STRIPS) test strip Use as instructed 08/30/17   Hoyt Koch, MD  lactose free nutrition (BOOST  PLUS) LIQD Take 237 mLs by mouth 3 (three) times daily with meals. 03/21/17   Cristal Ford, DO  Lancets MISC Use to test blood sugar one daily 08/30/17   Hoyt Koch, MD  losartan-hydrochlorothiazide [facility]) 100-12.5 MG tablet TAKE 1 TABLET BY MOUTH DAILY 10/16/17   Hoyt Koch, MD  metFORMIN (GLUCOPHAGE) 500 MG tablet TAKE 1 TABLET(500 MG) BY MOUTH TWICE DAILY WITH A MEAL 06/25/17   Hoyt Koch, MD  pantoprazole (PROTONIX) 40 MG tablet Take 1 tablet (40 mg total) by mouth daily. 03/21/17   Mikhail, Velta Addison, DO  tiotropium (SPIRIVA HANDIHALER) 18 MCG inhalation  capsule Place 1 capsule (18 mcg total) into inhaler and inhale daily. 04/13/17   Javier Glazier, MD    Family History Family History  Problem Relation Age of Onset  . Heart attack Mother   . Diabetes Maternal Grandfather   . Heart attack Maternal Grandfather   . Hypertension Maternal Grandfather   . Stroke Maternal Grandfather   . Heart attack Maternal Grandmother   . Hypertension Maternal Grandmother   . Alcohol abuse Father   . Thyroid disease Neg Hx   . Lung disease Neg Hx   . Colon cancer Neg Hx   . Breast cancer Neg Hx     Social History Social History   Tobacco Use  . Smoking status: Current Some Day Smoker    Packs/day: 1.50    Years: 41.00    Pack years: 61.50    Types: Cigarettes    Start date: 03/19/1974  . Smokeless tobacco: Never Used  . Tobacco comment: daily amount of cigarettes varies depending on stress level   Substance Use Topics  . Alcohol use: No    Alcohol/week: 0.0 oz  . 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  Respiratory: Positive for cough.   Cardiovascular: Positive for chest pain.       Chest pain with cough from  Gastrointestinal: Positive for abdominal pain.       Abdominalpain with cough from  All other systems reviewed and are negative.    Physical Exam Updated Vital Signs BP (!) 157/74 (BP Location: Left Arm)   Pulse 100   Temp 98.8 F (37.1 C) (Oral)   Resp (!) 22   SpO2 (!) 83%   Physical Exam  Constitutional:  Chronically ill-appearing  HENT:  Head: Normocephalic and atraumatic.  Eyes: Conjunctivae are normal. Pupils are equal, round, and reactive to light.  Neck: Neck supple. No tracheal deviation present. No thyromegaly  present.  Cardiovascular: Normal rate and regular rhythm.  No murmur heard. Pulmonary/Chest: Effort normal.  Diminished breath sounds diffusely, coughing frequently.  No respiratory distress while on oxygen  Abdominal: Soft. Bowel sounds are normal. She exhibits no distension. There is no tenderness.  Musculoskeletal: Normal range of motion. She exhibits no edema or tenderness.  Neurological: She is alert. Coordination normal.  Skin: Skin is warm and dry. No rash noted.  Psychiatric: She has a normal mood and affect.  Nursing note and vitals reviewed.    ED Treatments / Results  Labs (all labs ordered are listed, but only abnormal results are displayed) Labs Reviewed  CBC - Abnormal; Notable for the following components:      Result Value  RBC 5.25 (*)    Hemoglobin 15.9 (*)    HCT 46.3 (*)    All other components within normal limits  BASIC METABOLIC PANEL  I-STAT TROPONIN, ED    EKG  EKG Interpretation  Date/Time:  Wednesday November 07 2017 21:15:42 EST Ventricular Rate:  98 PR Interval:    QRS Duration: 81 QT Interval:  345 QTC Calculation: 441 R Axis:   42 Text Interpretation:  Sinus rhythm SINCE LAST TRACING HEART RATE HAS INCREASED Confirmed by Orlie Dakin [phone]) on 11/07/2017 9:21:48 PM      Chest x-ray viewed by me Radiology No results found.  Procedures Procedures (including critical care time)  Medications Ordered in ED Medications  albuterol (PROVENTIL) (2.5 MG/3ML) 0.083% nebulizer solution 5 mg (5 mg Nebulization Given 11/07/17 2122)   Chest x-ray viewed by me Results for orders placed or performed during the hospital encounter of 26/71/24  Basic metabolic panel  Result Value Ref Range   Sodium 138 135 - 145 mmol/L   Potassium 4.1 3.5 - 5.1 mmol/L   Chloride 101 101 - 111 mmol/L   CO2 26 22 - 32 mmol/L   Glucose, Bld 109 (H) 65 - 99 mg/dL   BUN 27 (H) 6 - 20 mg/dL   Creatinine, Ser 1.64 (H) 0.44 - 1.00 mg/dL   Calcium 9.1 8.9 - 10.3  mg/dL   GFR calc non Af Amer 31 (L) >60 mL/min   GFR calc Af Amer 36 (L) >60 mL/min   Anion gap 11 5 - 15  CBC  Result Value Ref Range   WBC 10.5 4.0 - 10.5 K/uL   RBC 5.25 (H) 3.87 - 5.11 MIL/uL   Hemoglobin 15.9 (H) 12.0 - 15.0 g/dL   HCT 46.3 (H) 36.0 - 46.0 %   MCV 88.2 78.0 - 100.0 fL   MCH 30.3 26.0 - 34.0 pg   MCHC 34.3 30.0 - 36.0 g/dL   RDW 13.7 11.5 - 15.5 %   Platelets 182 150 - 400 K/uL  Influenza panel by PCR (type A & B)  Result Value Ref Range   Influenza A By PCR NEGATIVE NEGATIVE   Influenza B By PCR NEGATIVE NEGATIVE  I-stat troponin, ED  Result Value Ref Range   Troponin i, poc 0.01 0.00 - 0.08 ng/mL   Comment 3           Dg Chest 2 View  Result Date: 11/07/2017 CLINICAL DATA:  Initial evaluation for acute productive cough, shortness of breath. History of COPD, smoking. EXAM: CHEST  2 VIEW COMPARISON:  Prior radiograph from 03/18/2017. FINDINGS: Cardiac and mediastinal silhouettes are stable in size and contour, and remain within normal limits. Aortic atherosclerosis. Lungs are hyperinflated with changes related to COPD. No superimposed focal infiltrates. No pulmonary edema or pleural effusion. No pneumothorax. No acute osseous abnormality.  Osteopenia. IMPRESSION: 1. COPD. 2. No other active cardiopulmonary disease identified. 3. Aortic atherosclerosis. Electronically Signed   By: Jeannine Boga M.D.   On: 11/07/2017 22:10     Initial Impression / Assessment and Plan / ED Course  I have reviewed the triage vital signs and the nursing notes.  Pertinent labs & imaging results that were available during my care of the patient were reviewed by me and considered in my medical decision making (see chart for details).     12:20 AM walks however becomes dyspneic when walking with worsening coughing.  She does not feel well enough to go home  After treatment with a 1  hour continuous nebulization.  She refused prednisone however she is amenable to IV Solu-Medrol  which I have ordered. I have consulted Dr.gardner will arrange for overnight stay  I counseled patient for 5 minutes on smoking cessation  Renal insufficiency is chronic Final Clinical Impressions(s) / ED Diagnoses  Diagnosis #1 COPD exacerbation #2 tobacco abuse #3 chronic renal insufficiency CRITICAL CARE Performed by: Orlie Dakin Total critical care time: 30 minutes Critical care time was exclusive of separately billable procedures and treating other patients. Critical care was necessary to treat or prevent imminent or life-threatening deterioration. Critical care was time spent personally by me on the following activities: development of treatment plan with patient and/or surrogate as well as nursing, discussions with consultants, evaluation of patient's response to treatment, examination of patient, obtaining history from patient or surrogate, ordering and performing treatments and interventions, ordering and review of laboratory studies, ordering and review of radiographic studies, pulse oximetry and re-evaluation of patient's condition. Final diagnoses:  None    ED Discharge Orders    None       Orlie Dakin, MD 11/08/17 [phone]

## 2017-11-08 ENCOUNTER — Other Ambulatory Visit: Payer: Self-pay

## 2017-11-08 ENCOUNTER — Encounter (HOSPITAL_COMMUNITY): Payer: Self-pay

## 2017-11-08 DIAGNOSIS — Y92230 Patient room in hospital as the place of occurrence of the external cause: Secondary | ICD-10-CM | POA: Diagnosis not present

## 2017-11-08 DIAGNOSIS — Z9049 Acquired absence of other specified parts of digestive tract: Secondary | ICD-10-CM | POA: Diagnosis not present

## 2017-11-08 DIAGNOSIS — D72829 Elevated white blood cell count, unspecified: Secondary | ICD-10-CM | POA: Diagnosis not present

## 2017-11-08 DIAGNOSIS — E1165 Type 2 diabetes mellitus with hyperglycemia: Secondary | ICD-10-CM | POA: Diagnosis not present

## 2017-11-08 DIAGNOSIS — F17210 Nicotine dependence, cigarettes, uncomplicated: Secondary | ICD-10-CM | POA: Diagnosis present

## 2017-11-08 DIAGNOSIS — J9621 Acute and chronic respiratory failure with hypoxia: Secondary | ICD-10-CM | POA: Diagnosis not present

## 2017-11-08 DIAGNOSIS — E1121 Type 2 diabetes mellitus with diabetic nephropathy: Secondary | ICD-10-CM | POA: Diagnosis not present

## 2017-11-08 DIAGNOSIS — N183 Chronic kidney disease, stage 3 (moderate): Secondary | ICD-10-CM | POA: Diagnosis not present

## 2017-11-08 DIAGNOSIS — G4700 Insomnia, unspecified: Secondary | ICD-10-CM | POA: Diagnosis not present

## 2017-11-08 DIAGNOSIS — I714 Abdominal aortic aneurysm, without rupture: Secondary | ICD-10-CM | POA: Diagnosis present

## 2017-11-08 DIAGNOSIS — I129 Hypertensive chronic kidney disease with stage 1 through stage 4 chronic kidney disease, or unspecified chronic kidney disease: Secondary | ICD-10-CM | POA: Diagnosis not present

## 2017-11-08 DIAGNOSIS — E1122 Type 2 diabetes mellitus with diabetic chronic kidney disease: Secondary | ICD-10-CM | POA: Diagnosis not present

## 2017-11-08 DIAGNOSIS — I10 Essential (primary) hypertension: Secondary | ICD-10-CM

## 2017-11-08 DIAGNOSIS — Z880 Allergy status to penicillin: Secondary | ICD-10-CM | POA: Diagnosis not present

## 2017-11-08 DIAGNOSIS — Z833 Family history of diabetes mellitus: Secondary | ICD-10-CM | POA: Diagnosis not present

## 2017-11-08 DIAGNOSIS — Z885 Allergy status to narcotic agent status: Secondary | ICD-10-CM | POA: Diagnosis not present

## 2017-11-08 DIAGNOSIS — Z720 Tobacco use: Secondary | ICD-10-CM

## 2017-11-08 DIAGNOSIS — R0602 Shortness of breath: Secondary | ICD-10-CM | POA: Diagnosis not present

## 2017-11-08 DIAGNOSIS — J441 Chronic obstructive pulmonary disease with (acute) exacerbation: Secondary | ICD-10-CM | POA: Diagnosis present

## 2017-11-08 DIAGNOSIS — K219 Gastro-esophageal reflux disease without esophagitis: Secondary | ICD-10-CM | POA: Diagnosis present

## 2017-11-08 DIAGNOSIS — Z8701 Personal history of pneumonia (recurrent): Secondary | ICD-10-CM | POA: Diagnosis not present

## 2017-11-08 DIAGNOSIS — Z888 Allergy status to other drugs, medicaments and biological substances status: Secondary | ICD-10-CM | POA: Diagnosis not present

## 2017-11-08 DIAGNOSIS — Z8249 Family history of ischemic heart disease and other diseases of the circulatory system: Secondary | ICD-10-CM | POA: Diagnosis not present

## 2017-11-08 DIAGNOSIS — T380X5A Adverse effect of glucocorticoids and synthetic analogues, initial encounter: Secondary | ICD-10-CM | POA: Diagnosis not present

## 2017-11-08 DIAGNOSIS — J449 Chronic obstructive pulmonary disease, unspecified: Secondary | ICD-10-CM | POA: Diagnosis not present

## 2017-11-08 DIAGNOSIS — Z90710 Acquired absence of both cervix and uterus: Secondary | ICD-10-CM | POA: Diagnosis not present

## 2017-11-08 DIAGNOSIS — E872 Acidosis: Secondary | ICD-10-CM | POA: Diagnosis not present

## 2017-11-08 DIAGNOSIS — Z882 Allergy status to sulfonamides status: Secondary | ICD-10-CM | POA: Diagnosis not present

## 2017-11-08 DIAGNOSIS — J9622 Acute and chronic respiratory failure with hypercapnia: Secondary | ICD-10-CM | POA: Diagnosis not present

## 2017-11-08 DIAGNOSIS — G4733 Obstructive sleep apnea (adult) (pediatric): Secondary | ICD-10-CM | POA: Diagnosis not present

## 2017-11-08 LAB — GLUCOSE, CAPILLARY
GLUCOSE-CAPILLARY: 203 mg/dL — AB (ref 65–99)
Glucose-Capillary: 217 mg/dL — ABNORMAL HIGH (ref 65–99)

## 2017-11-08 LAB — CBG MONITORING, ED: Glucose-Capillary: 190 mg/dL — ABNORMAL HIGH (ref 65–99)

## 2017-11-08 MED ORDER — ALBUTEROL SULFATE (2.5 MG/3ML) 0.083% IN NEBU
2.5000 mg | INHALATION_SOLUTION | RESPIRATORY_TRACT | Status: DC | PRN
  Administered 2017-11-09 – 2017-11-11 (×3): 2.5 mg via RESPIRATORY_TRACT
  Filled 2017-11-08 (×5): qty 3

## 2017-11-08 MED ORDER — OXYCODONE-ACETAMINOPHEN 5-325 MG PO TABS
1.0000 | ORAL_TABLET | ORAL | Status: DC | PRN
  Administered 2017-11-08 – 2017-11-09 (×6): 1 via ORAL
  Filled 2017-11-08 (×6): qty 1

## 2017-11-08 MED ORDER — ACETAMINOPHEN 325 MG PO TABS
650.0000 mg | ORAL_TABLET | Freq: Four times a day (QID) | ORAL | Status: DC | PRN
  Administered 2017-11-08 – 2017-11-19 (×4): 650 mg via ORAL
  Filled 2017-11-08 (×4): qty 2

## 2017-11-08 MED ORDER — AZITHROMYCIN 250 MG PO TABS
250.0000 mg | ORAL_TABLET | Freq: Every day | ORAL | Status: AC
  Administered 2017-11-09 – 2017-11-12 (×4): 250 mg via ORAL
  Filled 2017-11-08 (×4): qty 1

## 2017-11-08 MED ORDER — BOOST PLUS PO LIQD
237.0000 mL | Freq: Three times a day (TID) | ORAL | Status: DC
  Administered 2017-11-08: 237 mL via ORAL
  Filled 2017-11-08 (×2): qty 237

## 2017-11-08 MED ORDER — HYDROCHLOROTHIAZIDE 12.5 MG PO CAPS
12.5000 mg | ORAL_CAPSULE | Freq: Every day | ORAL | Status: DC
  Administered 2017-11-08: 12.5 mg via ORAL
  Filled 2017-11-08: qty 1

## 2017-11-08 MED ORDER — TIOTROPIUM BROMIDE MONOHYDRATE 18 MCG IN CAPS: 18.0000 ug | ORAL_CAPSULE | Freq: Every day | RESPIRATORY_TRACT | Status: DC

## 2017-11-08 MED ORDER — LOSARTAN POTASSIUM 50 MG PO TABS
100.0000 mg | ORAL_TABLET | Freq: Every day | ORAL | Status: DC
  Administered 2017-11-08: 100 mg via ORAL
  Filled 2017-11-08: qty 2

## 2017-11-08 MED ORDER — METHYLPREDNISOLONE SODIUM SUCC 125 MG IJ SOLR
125.0000 mg | Freq: Once | INTRAMUSCULAR | Status: AC
  Administered 2017-11-08: 125 mg via INTRAVENOUS
  Filled 2017-11-08: qty 2

## 2017-11-08 MED ORDER — PANTOPRAZOLE SODIUM 40 MG PO TBEC
40.0000 mg | DELAYED_RELEASE_TABLET | Freq: Every day | ORAL | Status: DC
  Administered 2017-11-08 – 2017-11-10 (×3): 40 mg via ORAL
  Filled 2017-11-08 (×4): qty 1

## 2017-11-08 MED ORDER — AZITHROMYCIN 250 MG PO TABS
500.0000 mg | ORAL_TABLET | Freq: Once | ORAL | Status: AC
  Administered 2017-11-08: 500 mg via ORAL
  Filled 2017-11-08: qty 2

## 2017-11-08 MED ORDER — INSULIN ASPART 100 UNIT/ML ~~LOC~~ SOLN
0.0000 [IU] | Freq: Three times a day (TID) | SUBCUTANEOUS | Status: DC
  Filled 2017-11-08: qty 1

## 2017-11-08 MED ORDER — ALBUTEROL SULFATE HFA 108 (90 BASE) MCG/ACT IN AERS: 2.0000 | INHALATION_SPRAY | Freq: Four times a day (QID) | RESPIRATORY_TRACT | Status: DC | PRN

## 2017-11-08 MED ORDER — METHYLPREDNISOLONE SODIUM SUCC 125 MG IJ SOLR
60.0000 mg | Freq: Two times a day (BID) | INTRAMUSCULAR | Status: DC
  Administered 2017-11-08 (×2): 60 mg via INTRAVENOUS
  Filled 2017-11-08 (×2): qty 2

## 2017-11-08 MED ORDER — ENOXAPARIN SODIUM 40 MG/0.4ML ~~LOC~~ SOLN
40.0000 mg | SUBCUTANEOUS | Status: DC
  Administered 2017-11-08: 40 mg via SUBCUTANEOUS
  Filled 2017-11-08: qty 0.4

## 2017-11-08 MED ORDER — LOSARTAN POTASSIUM-HCTZ 100-12.5 MG PO TABS: 1.0000 | ORAL_TABLET | Freq: Every day | ORAL | Status: DC

## 2017-11-08 MED ORDER — IPRATROPIUM-ALBUTEROL 0.5-2.5 (3) MG/3ML IN SOLN
3.0000 mL | Freq: Four times a day (QID) | RESPIRATORY_TRACT | Status: DC
  Administered 2017-11-08: 3 mL via RESPIRATORY_TRACT

## 2017-11-08 MED ORDER — ACETAMINOPHEN 325 MG PO TABS
650.0000 mg | ORAL_TABLET | Freq: Once | ORAL | Status: AC
  Administered 2017-11-08: 650 mg via ORAL
  Filled 2017-11-08: qty 2

## 2017-11-08 MED ORDER — TIOTROPIUM BROMIDE MONOHYDRATE 18 MCG IN CAPS
18.0000 ug | ORAL_CAPSULE | Freq: Every day | RESPIRATORY_TRACT | Status: DC
  Filled 2017-11-08: qty 5

## 2017-11-08 MED ORDER — ONDANSETRON HCL 4 MG PO TABS
4.0000 mg | ORAL_TABLET | Freq: Four times a day (QID) | ORAL | Status: DC | PRN
  Administered 2017-11-09: 4 mg via ORAL
  Filled 2017-11-08 (×2): qty 1

## 2017-11-08 MED ORDER — ALBUTEROL SULFATE 0.63 MG/3ML IN NEBU: 1.0000 | INHALATION_SOLUTION | Freq: Four times a day (QID) | RESPIRATORY_TRACT | Status: DC | PRN

## 2017-11-08 MED ORDER — METFORMIN HCL 500 MG PO TABS
500.0000 mg | ORAL_TABLET | Freq: Two times a day (BID) | ORAL | Status: DC
  Administered 2017-11-08: 500 mg via ORAL
  Filled 2017-11-08 (×2): qty 1

## 2017-11-08 MED ORDER — HYDROCODONE-ACETAMINOPHEN 5-325 MG PO TABS
1.0000 | ORAL_TABLET | Freq: Four times a day (QID) | ORAL | Status: DC | PRN
  Administered 2017-11-08: 1 via ORAL
  Filled 2017-11-08: qty 1

## 2017-11-08 MED ORDER — BOOST PLUS PO LIQD
237.0000 mL | Freq: Two times a day (BID) | ORAL | Status: DC
  Administered 2017-11-09: 237 mL via ORAL
  Filled 2017-11-08 (×5): qty 237

## 2017-11-08 MED ORDER — ALBUTEROL SULFATE (2.5 MG/3ML) 0.083% IN NEBU
2.5000 mg | INHALATION_SOLUTION | Freq: Three times a day (TID) | RESPIRATORY_TRACT | Status: DC
Start: 2017-11-08 — End: 2017-11-08

## 2017-11-08 MED ORDER — IPRATROPIUM-ALBUTEROL 0.5-2.5 (3) MG/3ML IN SOLN: 3.0000 mL | Freq: Three times a day (TID) | RESPIRATORY_TRACT | Status: DC

## 2017-11-08 MED ORDER — GUAIFENESIN-DM 100-10 MG/5ML PO SYRP
5.0000 mL | ORAL_SOLUTION | ORAL | Status: DC | PRN
  Filled 2017-11-08 (×2): qty 5

## 2017-11-08 MED ORDER — ONDANSETRON HCL 4 MG/2ML IJ SOLN
4.0000 mg | Freq: Four times a day (QID) | INTRAMUSCULAR | Status: DC | PRN
  Administered 2017-11-09 (×2): 4 mg via INTRAVENOUS
  Filled 2017-11-08 (×2): qty 2

## 2017-11-08 MED ORDER — ALBUTEROL SULFATE (2.5 MG/3ML) 0.083% IN NEBU
2.5000 mg | INHALATION_SOLUTION | Freq: Three times a day (TID) | RESPIRATORY_TRACT | Status: DC
  Administered 2017-11-08 – 2017-11-09 (×3): 2.5 mg via RESPIRATORY_TRACT
  Filled 2017-11-08 (×3): qty 3

## 2017-11-08 MED ORDER — ACETAMINOPHEN 650 MG RE SUPP: 650.0000 mg | Freq: Four times a day (QID) | RECTAL | Status: DC | PRN

## 2017-11-08 NOTE — ED Notes (Signed)
Consulting Provider at bedside. 

## 2017-11-08 NOTE — ED Notes (Signed)
ED TO INPATIENT HANDOFF REPORT  Name/Age/Gender Victoria Bird 72 y.o. female  Code Status    Code Status Orders  (From admission, onward)        Start     Ordered   11/08/17 0532  Full code  Continuous     11/08/17 0531    Code Status History    Date Active Date Inactive Code Status Order ID Comments User Context   03/12/2017 21:26 03/21/2017 21:21 Full Code [id number]  Etta Quill, DO ED   03/03/2017 12:05 03/03/2017 20:59 Full Code [id number]  [name], MD Inpatient   12/16/2016 03:51 12/19/2016 19:45 Full Code [id number]  [name], MD Inpatient   06/08/2016 04:59 06/11/2016 20:15 Full Code [id number]  [name], MD Inpatient   03/10/2016 10:38 03/20/2016 21:54 Full Code [id number]  [name], MD Inpatient   12/25/2015 05:05 12/31/2015 20:12 Full Code [id number]  [name], MD Inpatient   12/12/2015 13:02 12/18/2015 16:30 Full Code [id number]  [name], MD ED   06/16/2015 21:44 06/18/2015 18:35 Full Code [id number]  [name], MD Inpatient   10/09/2014 03:54 10/12/2014 20:10 Full Code [id number]  [name], MD ED   04/10/2013 06:02 04/12/2013 20:34 Full Code [id number]  [name], MD ED   01/07/2013 20:41 01/08/2013 17:58 Full Code [id number]  Robbie Lis, MD Inpatient   12/25/2011 04:08 01/01/2012 20:00 Full Code [id number]  [name], RN Inpatient      Home/SNF/Other Home  Chief Complaint SOB, Abdominal pain, Headache  Level of Care/Admitting Diagnosis ED Disposition    ED Disposition Condition [facility] Area: [facility] [[id number]]  Level of Care: Med-Surg [16]  Diagnosis: COPD with acute exacerbation [facility]) [[id number]]  Admitting Physician: Etta Quill [phone]  Attending Physician: Etta Quill [[id number]]  PT Class (Do Not Modify): Observation [104]  PT Acc Code (Do Not Modify): Observation [10022]       Medical History Past Medical History:  Diagnosis Date  .  Anxiety   . Chronic pain   . Colon polyps    adenomatous  . COPD (chronic obstructive pulmonary disease) ([place])   . Diabetes mellitus   . Diverticulitis   . Diverticulosis   . Emphysema lung ([place])   . Gall stones   . GERD (gastroesophageal reflux disease)   . H/O: pneumonia   . High cholesterol   . Hypertension   . OSA (obstructive sleep apnea)    no cpap  . Tracheobronchitis 01/01/2012    Allergies Allergies  Allergen Reactions  . Aspartame And Phenylalanine Nausea And Vomiting    Patient says she is allergic to all artificial sweeteners  . Doxycycline Other (See Comments)    Nausea, vomiting, HA, double vision  . Tramadol Shortness Of Breath and Nausea Only  . Codeine Itching    Has not tried benadryl to alleviate side effects  . Neurontin [Gabapentin] Other (See Comments)    Dizzy, "drugged" feeling, sleepy  . Sulfa Antibiotics Other (See Comments)    Kidney problem   . Symbicort [Budesonide-Formoterol Fumarate]     Swelling of face and inside of mouth per pt  . Penicillins Rash    Has patient had a PCN reaction causing immediate rash, facial/tongue/throat swelling, SOB or lightheadedness with hypotension: No Has patient had a PCN reaction causing severe rash involving mucus membranes or skin necrosis: No Has patient had a PCN reaction that required hospitalization No Has patient had  a PCN reaction occurring within the last 10 years: No If all of the above answers are "NO", then may proceed with Cephalosporin use.  . Prednisone Rash    Patient stated she received Prednisone while she was in the hospital and experienced a rash and "extreme pain.'    IV Location/Drains/Wounds Patient Lines/Drains/Airways Status   Active Line/Drains/Airways    Name:   Placement date:   Placement time:   Site:   Days:   Peripheral IV 11/07/17 Left Arm   11/07/17    2301    Arm   1          Labs/Imaging Results for orders placed or performed during the hospital encounter of 11/07/17  (from the past 48 hour(s))  Basic metabolic panel     Status: Abnormal   Collection Time: 11/07/17  9:21 PM  Result Value Ref Range   Sodium 138 135 - 145 mmol/L   Potassium 4.1 3.5 - 5.1 mmol/L   Chloride 101 101 - 111 mmol/L   CO2 26 22 - 32 mmol/L   Glucose, Bld 109 (H) 65 - 99 mg/dL   BUN 27 (H) 6 - 20 mg/dL   Creatinine, Ser 1.64 (H) 0.44 - 1.00 mg/dL   Calcium 9.1 8.9 - 10.3 mg/dL   GFR calc non Af Amer 31 (L) >60 mL/min   GFR calc Af Amer 36 (L) >60 mL/min    Comment: (NOTE) The eGFR has been calculated using the CKD EPI equation. This calculation has not been validated in all clinical situations. eGFR's persistently <60 mL/min signify possible Chronic Kidney Disease.    Anion gap 11 5 - 15    Comment: Performed at [facility], [place] [address], [place], [place] [address]  CBC     Status: Abnormal   Collection Time: 11/07/17  9:21 PM  Result Value Ref Range   WBC 10.5 4.0 - 10.5 K/uL   RBC 5.25 (H) 3.87 - 5.11 MIL/uL   Hemoglobin 15.9 (H) 12.0 - 15.0 g/dL   HCT 46.3 (H) 36.0 - 46.0 %   MCV 88.2 78.0 - 100.0 fL   MCH 30.3 26.0 - 34.0 pg   MCHC 34.3 30.0 - 36.0 g/dL   RDW 13.7 11.5 - 15.5 %   Platelets 182 150 - 400 K/uL    Comment: Performed at [facility], [place] [address], [place], [place] [address]  I-stat troponin, ED     Status: None   Collection Time: 11/07/17  9:36 PM  Result Value Ref Range   Troponin i, poc 0.01 0.00 - 0.08 ng/mL   Comment 3            Comment: Due to the release kinetics of cTnI, a negative result within the first hours of the onset of symptoms does not rule out myocardial infarction with certainty. If myocardial infarction is still suspected, repeat the test at appropriate intervals.   Influenza panel by PCR (type A & B)     Status: None   Collection Time: 11/07/17 10:58 PM  Result Value Ref Range   Influenza A By PCR NEGATIVE NEGATIVE   Influenza B By PCR NEGATIVE NEGATIVE    Comment:  (NOTE) The Xpert Xpress Flu assay is intended as an aid in the diagnosis of  influenza and should not be used as a sole basis for treatment.  This  assay is FDA approved for nasopharyngeal swab specimens only. Nasal  washings and aspirates are unacceptable for Xpert  Xpress Flu testing. Performed at [facility], [place] [address], [place], [place] [address]   CBG monitoring, ED     Status: Abnormal   Collection Time: 11/08/17  8:22 AM  Result Value Ref Range   Glucose-Capillary 190 (H) 65 - 99 mg/dL   Dg Chest 2 View  Result Date: 11/07/2017 CLINICAL DATA:  Initial evaluation for acute productive cough, shortness of breath. History of COPD, smoking. EXAM: CHEST  2 VIEW COMPARISON:  Prior radiograph from 03/18/2017. FINDINGS: Cardiac and mediastinal silhouettes are stable in size and contour, and remain within normal limits. Aortic atherosclerosis. Lungs are hyperinflated with changes related to COPD. No superimposed focal infiltrates. No pulmonary edema or pleural effusion. No pneumothorax. No acute osseous abnormality.  Osteopenia. IMPRESSION: 1. COPD. 2. No other active cardiopulmonary disease identified. 3. Aortic atherosclerosis. Electronically Signed   By: Jeannine Boga M.D.   On: 11/07/2017 22:10    Pending Labs Unresulted Labs (From admission, onward)   Start     Ordered   11/09/17 2524  Basic metabolic panel  Tomorrow morning,   R     11/08/17 0554   11/09/17 1590  Basic metabolic panel  Tomorrow morning,   R     11/08/17 1210      Vitals/Pain Today's Vitals   11/08/17 0754 11/08/17 0800 11/08/17 1118 11/08/17 1119  BP:  (!) 150/86  (!) 157/98  Pulse: 95 94  96  Resp: 18   20  Temp:      TempSrc:    Oral  SpO2: 94% 93%  95%  PainSc:   7      Isolation Precautions Droplet precaution  Medications Medications  insulin aspart (novoLOG) injection 0-15 Units (0 Units Subcutaneous Refused 11/08/17 0835)  pantoprazole (PROTONIX) EC tablet 40 mg (not  administered)  tiotropium (SPIRIVA) inhalation capsule 18 mcg (18 mcg Inhalation Not Given 11/08/17 1113)  methylPREDNISolone sodium succinate (SOLU-MEDROL) 125 mg/2 mL injection 60 mg (60 mg Intravenous Given 11/08/17 1122)  albuterol (PROVENTIL) (2.5 MG/3ML) 0.083% nebulizer solution 2.5 mg (not administered)  azithromycin (ZITHROMAX) tablet 500 mg (500 mg Oral Given 11/08/17 0612)    Followed by  azithromycin (ZITHROMAX) tablet 250 mg (not administered)  acetaminophen (TYLENOL) tablet 650 mg (650 mg Oral Given 11/08/17 0837)    Or  acetaminophen (TYLENOL) suppository 650 mg ( Rectal See Alternative 11/08/17 0837)  ondansetron (ZOFRAN) tablet 4 mg (not administered)    Or  ondansetron (ZOFRAN) injection 4 mg (not administered)  enoxaparin (LOVENOX) injection 40 mg (not administered)  HYDROcodone-acetaminophen (NORCO/VICODIN) 5-325 MG per tablet 1 tablet (1 tablet Oral Given 11/08/17 1128)  ipratropium-albuterol (DUONEB) 0.5-2.5 (3) MG/3ML nebulizer solution 3 mL (not administered)  losartan (COZAAR) tablet 100 mg (not administered)    And  hydrochlorothiazide (MICROZIDE) capsule 12.5 mg (not administered)  lactose free nutrition (BOOST PLUS) liquid 237 mL (not administered)  albuterol (PROVENTIL) (2.5 MG/3ML) 0.083% nebulizer solution 5 mg (5 mg Nebulization Given 11/07/17 2122)  sodium chloride 0.9 % bolus 1,000 mL (0 mLs Intravenous Stopped 11/08/17 0051)  albuterol (PROVENTIL,VENTOLIN) solution continuous neb (10 mg/hr Nebulization Given 11/07/17 2246)  benzonatate (TESSALON) capsule 100 mg (100 mg Oral Given 11/07/17 2307)  acetaminophen (TYLENOL) tablet 650 mg (650 mg Oral Given 11/08/17 0050)  methylPREDNISolone sodium succinate (SOLU-MEDROL) 125 mg/2 mL injection 125 mg (125 mg Intravenous Given 11/08/17 0050)    Mobility walks with person assist

## 2017-11-08 NOTE — ED Notes (Signed)
ED Provider at bedside. 

## 2017-11-08 NOTE — H&P (Signed)
History and Physical    Victoria Bird YFR:[id number] DOB: 09/03/1946 DOA: 11/07/2017  PCP: Hoyt Koch, MD  Patient coming from: Home  I have personally briefly reviewed patient's old medical records in [place]  Chief Complaint: SOB  HPI: [name] is a 72 y.o. female with medical history significant of COPD, HTN, DM2, still smoking.  Patient presents to the ED with c/o SOB, cough productive of yellow sputum.  Symptoms onset 3 days ago.  Persistent.  Subjective fever yesterday.  CP and abd pain with coughing only.  Home nebs w/o relief.  Last admitted for COPD exacerbation in June last year.  Of note, everyone else in house-hold is sick with URI symptoms.   ED Course: Given solumedrol, neb treatments with some improvement in symptoms.  Flu is negative.   Review of Systems: As per HPI otherwise 10 point review of systems negative.   Past Medical History:  Diagnosis Date  . Anxiety   . Chronic pain   . Colon polyps    adenomatous  . COPD (chronic obstructive pulmonary disease) ([place])   . Diabetes mellitus   . Diverticulitis   . Diverticulosis   . Emphysema lung ([place])   . Gall stones   . GERD (gastroesophageal reflux disease)   . H/O: pneumonia   . High cholesterol   . Hypertension   . OSA (obstructive sleep apnea)    no cpap  . Tracheobronchitis 01/01/2012    Past Surgical History:  Procedure Laterality Date  . ABDOMINAL HYSTERECTOMY    . APPENDECTOMY    . Bilateral shoulder surgery    . CARPAL TUNNEL RELEASE Left   . CATARACT EXTRACTION    . CHOLECYSTECTOMY    . COLONOSCOPY    . ESOPHAGOGASTRODUODENOSCOPY    . FRACTURE SURGERY    . Right knee surgery    . ROTATOR CUFF REPAIR       reports that she has been smoking cigarettes.  She started smoking about 43 years ago. She has a 61.50 pack-year smoking history. she has never used smokeless tobacco. She reports that she does not drink alcohol or use drugs.  Allergies  Allergen  Reactions  . Aspartame And Phenylalanine Nausea And Vomiting    Patient says she is allergic to all artificial sweeteners  . Doxycycline Other (See Comments)    Nausea, vomiting, HA, double vision  . Tramadol Shortness Of Breath and Nausea Only  . Codeine Itching    Has not tried benadryl to alleviate side effects  . Neurontin [Victoria Bird] Other (See Comments)    Dizzy, "drugged" feeling, sleepy  . Sulfa Antibiotics Other (See Comments)    Kidney problem   . Symbicort [Budesonide-Formoterol Fumarate]     Swelling of face and inside of mouth per pt  . Penicillins Rash    Has patient had a PCN reaction causing immediate rash, facial/tongue/throat swelling, SOB or lightheadedness with hypotension: No Has patient had a PCN reaction causing severe rash involving mucus membranes or skin necrosis: No Has patient had a PCN reaction that required hospitalization No Has patient had a PCN reaction occurring within the last 10 years: No If all of the above answers are "NO", then may proceed with Cephalosporin use.  . Prednisone Rash    Patient stated she received Prednisone while she was in the hospital and experienced a rash and "extreme pain.'    Family History  Problem Relation Age of Onset  . Heart attack Mother   . Diabetes  Maternal Grandfather   . Heart attack Maternal Grandfather   . Hypertension Maternal Grandfather   . Stroke Maternal Grandfather   . Heart attack Maternal Grandmother   . Hypertension Maternal Grandmother   . Alcohol abuse Father   . Thyroid disease Neg Hx   . Lung disease Neg Hx   . Colon cancer Neg Hx   . Breast cancer Neg Hx      Prior to Admission medications   Medication Sig Start Date End Date Taking? Authorizing Provider  albuterol (ACCUNEB) 0.63 MG/3ML nebulizer solution Take 1 ampule by nebulization every 6 (six) hours as needed for wheezing or shortness of breath.   Yes [provider]  albuterol (PROAIR HFA) 108 (90 Base) MCG/ACT inhaler  Inhale 2 puffs into the lungs every 6 (six) hours as needed for wheezing or shortness of breath. 11/02/17  Yes Parrett, Tammy S, NP  lactose free nutrition (BOOST PLUS) LIQD Take 237 mLs by mouth 3 (three) times daily with meals. 03/21/17  Yes Mikhail, [place], DO  losartan-hydrochlorothiazide (HYZAAR) 100-12.5 MG tablet TAKE 1 TABLET BY MOUTH DAILY 10/16/17  Yes Hoyt Koch, MD  metFORMIN (GLUCOPHAGE) 500 MG tablet TAKE 1 TABLET(500 MG) BY MOUTH TWICE DAILY WITH A MEAL 06/25/17  Yes Hoyt Koch, MD  pantoprazole (PROTONIX) 40 MG tablet Take 1 tablet (40 mg total) by mouth daily. 03/21/17  Yes Mikhail, Maryann, DO  tiotropium (SPIRIVA HANDIHALER) 18 MCG inhalation capsule Place 1 capsule (18 mcg total) into inhaler and inhale daily. 04/13/17  Yes Javier Glazier, MD  blood glucose meter kit and supplies KIT Use to test blood sugar 08/30/17   Hoyt Koch, MD  glucose blood (IGLUCOSE TEST STRIPS) test strip Use as instructed 08/30/17   Hoyt Koch, MD  Lancets MISC Use to test blood sugar one daily 08/30/17   Hoyt Koch, MD    Physical Exam: Vitals:   11/08/17 0400 11/08/17 0430 11/08/17 0500 11/08/17 0530  BP: (!) 150/65 (!) 146/69 136/66 135/64  Pulse: 90 91 87 88  Resp: (!) 22 (!) 26 (!) 27 (!) 24  Temp:      TempSrc:      SpO2: 91% 93% 93% 94%    Constitutional: NAD, calm, comfortable Eyes: PERRL, lids and conjunctivae normal ENMT: Mucous membranes are moist. Posterior pharynx clear of any exudate or lesions.Normal dentition.  Neck: normal, supple, no masses, no thyromegaly Respiratory: Wheezing Cardiovascular: Regular rate and rhythm, no murmurs / rubs / gallops. No extremity edema. 2+ pedal pulses. No carotid bruits.  Abdomen: no tenderness, no masses palpated. No hepatosplenomegaly. Bowel sounds positive.  Musculoskeletal: no clubbing / cyanosis. No joint deformity upper and lower extremities. Good ROM, no contractures. Normal muscle  tone.  Skin: no rashes, lesions, ulcers. No induration Neurologic: CN 2-12 grossly intact. Sensation intact, DTR normal. Strength 5/5 in all 4.  Psychiatric: Normal judgment and insight. Alert and oriented x 3. Normal mood.    Labs on Admission: I have personally reviewed following labs and imaging studies  CBC: Recent Labs  Lab 11/07/17 2121  WBC 10.5  HGB 15.9*  HCT 46.3*  MCV 88.2  PLT 628   Basic Metabolic Panel: Recent Labs  Lab 11/07/17 2121  NA 138  K 4.1  CL 101  CO2 26  GLUCOSE 109*  BUN 27*  CREATININE 1.64*  CALCIUM 9.1   GFR: Estimated Creatinine Clearance: 30.7 mL/min (A) (by C-G formula based on SCr of 1.64 mg/dL (H)). Liver Function Tests:  No results for input(s): AST, ALT, ALKPHOS, BILITOT, PROT, ALBUMIN in the last 168 hours. No results for input(s): LIPASE, AMYLASE in the last 168 hours. No results for input(s): AMMONIA in the last 168 hours. Coagulation Profile: No results for input(s): INR, PROTIME in the last 168 hours. Cardiac Enzymes: No results for input(s): CKTOTAL, CKMB, CKMBINDEX, TROPONINI in the last 168 hours. BNP (last 3 results) No results for input(s): PROBNP in the last 8760 hours. HbA1C: No results for input(s): HGBA1C in the last 72 hours. CBG: No results for input(s): GLUCAP in the last 168 hours. Lipid Profile: No results for input(s): CHOL, HDL, LDLCALC, TRIG, CHOLHDL, LDLDIRECT in the last 72 hours. Thyroid Function Tests: Recent Labs    11/05/17 1512  TSH 2.19  FREET4 1.03   Anemia Panel: No results for input(s): VITAMINB12, FOLATE, FERRITIN, TIBC, IRON, RETICCTPCT in the last 72 hours. Urine analysis:    Component Value Date/Time   COLORURINE YELLOW 03/02/2017 2245   APPEARANCEUR HAZY (A) 03/02/2017 2245   LABSPEC 1.008 03/02/2017 2245   PHURINE 5.0 03/02/2017 2245   GLUCOSEU NEGATIVE 03/02/2017 2245   HGBUR NEGATIVE 03/02/2017 2245   BILIRUBINUR NEGATIVE 03/02/2017 2245   KETONESUR NEGATIVE 03/02/2017 2245     PROTEINUR NEGATIVE 03/02/2017 2245   UROBILINOGEN 1.0 06/16/2015 1545   NITRITE NEGATIVE 03/02/2017 2245   LEUKOCYTESUR NEGATIVE 03/02/2017 2245    Radiological Exams on Admission: Dg Chest 2 View  Result Date: 11/07/2017 CLINICAL DATA:  Initial evaluation for acute productive cough, shortness of breath. History of COPD, smoking. EXAM: CHEST  2 VIEW COMPARISON:  Prior radiograph from 03/18/2017. FINDINGS: Cardiac and mediastinal silhouettes are stable in size and contour, and remain within normal limits. Aortic atherosclerosis. Lungs are hyperinflated with changes related to COPD. No superimposed focal infiltrates. No pulmonary edema or pleural effusion. No pneumothorax. No acute osseous abnormality.  Osteopenia. IMPRESSION: 1. COPD. 2. No other active cardiopulmonary disease identified. 3. Aortic atherosclerosis. Electronically Signed   By: Jeannine Boga M.D.   On: 11/07/2017 22:10    EKG: Independently reviewed.  Assessment/Plan Principal Problem:   COPD with acute exacerbation ([name]) Active Problems:   Hypertension   Diabetes mellitus type 2, controlled ([place])   CKD (chronic kidney disease), stage III ([place])   Tobacco abuse    1. COPD exacerbation - 1. COPD pathway 2. Solumedrol 60 IV Q12H 1. She will let us give her solumedrol, but will refuse prednisone. 2. H/o cataracts but these were extracted bilaterally 3. Azithromycin 4. Spiriva 5. Albuterol PRN 2. DM2 - 1. Mod scale SSI AC/HS 2. Holding metformin 3. HTN - 1. Continue home losartan - HCTZ 4. CKD stage 3 - 1. At or slightly above baseline with creat 1.6.  Was running 1.2 last year 2. Repeat BMP tomorrow and keep eye on this 5. Tobacco abuse - needs to quit  DVT prophylaxis: Lovenox Code Status: Full Family Communication: No family in room Disposition Plan: Home Consults called: None Admission status: Place in 25, [facility] Pager [phone]  If 7AM-7PM, please  contact day team taking care of patient [email or web address] Password [facility]  11/08/2017, 5:48 AM

## 2017-11-08 NOTE — Progress Notes (Signed)
Patient seen and examined this morning, admitted overnight by Dr. Alcario Drought, H&P reviewed and agree with the assessment and plan.  In brief, this is a 72 year old female with history of COPD, hypertension, type 2 diabetes mellitus, ongoing tobacco abuse who presented to the emergency room with shortness of breath, cough productive of yellow sputum  with onset of about 3 days ago, persistent, decided to come to the ED.  She states that everybody in her household is sick with upper respiratory symptoms   COPD exacerbation -Supportive treatment with duo nebs, Solu-Medrol, azithromycin  Type 2 diabetes mellitus -Hold metformin, continue sliding scale  Hypertension -Continue home medications  Chronic kidney disease stage III -Slightly higher than baseline, continue to monitor  Tobacco abuse -Counseled to quit   Victoria Bird M. Cruzita Lederer, MD [facility] [phone]  If 7PM-7AM, please contact night-coverage [email or web address] Password [email or web address]

## 2017-11-08 NOTE — Progress Notes (Signed)
Nutrition Brief Note  Consult per COPD Gold Protocol.   Wt Readings from Last 15 Encounters:  11/05/17 162 lb 3.2 oz (73.6 kg)  07/30/17 155 lb (70.3 kg)  07/19/17 155 lb (70.3 kg)  04/13/17 158 lb 12.8 oz (72 kg)  03/12/17 158 lb (71.7 kg)  03/03/17 158 lb (71.7 kg)  12/18/16 158 lb (71.7 kg)  11/02/16 163 lb (73.9 kg)  10/24/16 167 lb (75.8 kg)  07/19/16 165 lb 6.4 oz (75 kg)  06/08/16 163 lb 5.8 oz (74.1 kg)  05/15/16 165 lb 3.2 oz (74.9 kg)  04/26/16 166 lb 12.8 oz (75.7 kg)  03/20/16 175 lb 3.2 oz (79.5 kg)  02/29/16 167 lb (75.8 kg)    BMI: 28.75 kg/m2; this indicates overweight status. Skin WDL. Pt with PMH of COPD, HTN, Type 2 DM, actively smokes. She arrived to the ED with SOB and productive cough.   Current diet order is Carb Modified. Medications reviewed; sliding scale Novolog, 125 mg Solu-medrol x1 dose today, 60 mg Solu-medrol BID. Labs reviewed; CBG: 190 mg/dL this AM, BUN: 27 mg/dL, creatinine: 1.64 mg/dL, GFR: 31 mL/min.   Pt currently ordered Boost Plus TID, each supplement provides 360 kcal and 14 grams of protein. Will decrease to BID. No additional nutrition interventions warranted at this time. If nutrition issues arise, please consult RD.      Jarome Matin, MS, RD, LDN, [name] Inpatient Clinical Dietitian Pager # [phone] After hours/weekend pager # [phone]

## 2017-11-09 LAB — BASIC METABOLIC PANEL
ANION GAP: 9 (ref 5–15)
BUN: 46 mg/dL — ABNORMAL HIGH (ref 6–20)
CO2: 25 mmol/L (ref 22–32)
Calcium: 8.9 mg/dL (ref 8.9–10.3)
Chloride: 105 mmol/L (ref 101–111)
Creatinine, Ser: 1.94 mg/dL — ABNORMAL HIGH (ref 0.44–1.00)
GFR calc Af Amer: 29 mL/min — ABNORMAL LOW (ref >60)
GFR, EST NON AFRICAN AMERICAN: 25 mL/min — AB (ref >60)
GLUCOSE: 158 mg/dL — AB (ref 65–99)
Potassium: 4.3 mmol/L (ref 3.5–5.1)
SODIUM: 139 mmol/L (ref 135–145)

## 2017-11-09 LAB — GLUCOSE, CAPILLARY
GLUCOSE-CAPILLARY: 138 mg/dL — AB (ref 65–99)
GLUCOSE-CAPILLARY: 132 mg/dL — AB (ref 65–99)
Glucose-Capillary: 135 mg/dL — ABNORMAL HIGH (ref 65–99)

## 2017-11-09 MED ORDER — ARFORMOTEROL TARTRATE 15 MCG/2ML IN NEBU: 15.0000 ug | INHALATION_SOLUTION | Freq: Two times a day (BID) | RESPIRATORY_TRACT | Status: DC

## 2017-11-09 MED ORDER — IPRATROPIUM-ALBUTEROL 0.5-2.5 (3) MG/3ML IN SOLN
3.0000 mL | Freq: Four times a day (QID) | RESPIRATORY_TRACT | Status: DC
  Administered 2017-11-09 – 2017-11-12 (×11): 3 mL via RESPIRATORY_TRACT
  Filled 2017-11-09 (×10): qty 3

## 2017-11-09 MED ORDER — METHYLPREDNISOLONE SODIUM SUCC 40 MG IJ SOLR
40.0000 mg | Freq: Every day | INTRAMUSCULAR | Status: DC
  Filled 2017-11-09: qty 1

## 2017-11-09 MED ORDER — METHYLPREDNISOLONE SODIUM SUCC 40 MG IJ SOLR
40.0000 mg | Freq: Two times a day (BID) | INTRAMUSCULAR | Status: DC
  Administered 2017-11-09 – 2017-11-10 (×3): 40 mg via INTRAVENOUS
  Filled 2017-11-09 (×2): qty 1

## 2017-11-09 MED ORDER — ENOXAPARIN SODIUM 30 MG/0.3ML ~~LOC~~ SOLN
30.0000 mg | SUBCUTANEOUS | Status: DC
  Administered 2017-11-09 – 2017-11-11 (×3): 30 mg via SUBCUTANEOUS
  Filled 2017-11-09 (×3): qty 0.3

## 2017-11-09 MED ORDER — SODIUM CHLORIDE 0.9 % IV BOLUS (SEPSIS)
1000.0000 mL | Freq: Once | INTRAVENOUS | Status: AC
  Administered 2017-11-09: 1000 mL via INTRAVENOUS

## 2017-11-09 MED ORDER — BENZONATATE 100 MG PO CAPS
100.0000 mg | ORAL_CAPSULE | Freq: Three times a day (TID) | ORAL | Status: DC | PRN
  Administered 2017-11-09 (×2): 100 mg via ORAL
  Filled 2017-11-09 (×3): qty 1

## 2017-11-09 NOTE — Progress Notes (Signed)
Entered room to give pt. HHN treatment and appreciated an open DuoNeb at bedside.  Spoke to RN who stated that she gave treatment  To patient earlier.

## 2017-11-09 NOTE — Progress Notes (Signed)
PROGRESS NOTE  Victoria Bird [id number]:[id number] DOB: 05/08/1946 DOA: 11/07/2017 PCP: Hoyt Koch, MD   LOS: 1 day   Brief Narrative / Interim history: 72 year old female with history of COPD, hypertension, type 2 diabetes mellitus, ongoing tobacco abuse who presented to the emergency room with shortness of breath, cough productive of yellow sputum  with onset of about 3 days ago, persistent, decided to come to the ED.  She states that everybody in her household is sick with upper respiratory symptoms  Assessment & Plan: Principal Problem:   COPD with acute exacerbation ([place]) Active Problems:   Hypertension   Diabetes mellitus type 2, controlled ([place])   CKD (chronic kidney disease), stage III ([name])   Tobacco abuse   COPD exacerbation with acute on chronic hypoxic respiratory failure -Seems to be worsening today, low threshold to move to stepdown, will reevaluate in a few hours -Supportive treatment with duo nebs, Solu-Medrol, azithromycin -Patient is apparently allergic to inhaled steroids as well as inhaled long-acting beta agonists -She also reports that she cannot tolerate prednisone will keep on IV steroids -worsening respiratory status this morning in the setting of coughing spell  Type 2 diabetes mellitus -Patient refusing insulin as she was told that she could no longer donate blood, she is a lifelong donor -Insisting on being on metformin, however we will have to discontinue that today due to worsening creatinine -Insisting on being on a regular diet and refuses carb modified due to her allergy to artificial sweeteners  Hypertension -Continue home medications, stable today 124/49  Chronic kidney disease stage III -Creatinine continues to get worse today at 1.9, she is off of metformin, off of her ARB/HCTZ -Suspect we need to discontinue these on discharge -Appears clinically dry, will give her small bolus  Tobacco abuse -Counseled to quit     DVT  prophylaxis: Lovenox Code Status: Full code Family Communication: No family present at bedside Disposition Plan: Home when ready  Consultants:   None  Procedures:   None   Antimicrobials:  Azithromycin 2/7 >>   Subjective: -Complains of significant shortness of breath, cannot stop coughing  Objective: Vitals:   11/08/17 2040 11/09/17 0246 11/09/17 0405 11/09/17 0850  BP: (!) 141/58  (!) 124/49   Pulse: (!) 101  76 80  Resp: 20  20 (!) 24  Temp: 98.2 F (36.8 C)  98.2 F (36.8 C)   TempSrc: Oral  Oral   SpO2: 94% 92% 92% 90%  Weight:      Height:        Intake/Output Summary (Last 24 hours) at 11/09/2017 1247 Last data filed at 11/09/2017 1000 Gross per 24 hour  Intake 360 ml  Output 0 ml  Net 360 ml   Filed Weights   11/08/17 1330  Weight: 71.5 kg (157 lb 11.2 oz)    Examination:  Constitutional: Appears in distress, using accessory muscle when breathing, she is tachypneic Eyes: lids and conjunctivae normal ENMT: Mucous membranes are moist. Neck: normal, supple Respiratory: Diffuse significant wheezing, coarse breath sounds throughout, increased respiratory effort with accessory muscle use  Cardiovascular: Regular rate and rhythm, no murmurs / rubs / gallops. No LE edema.  Tachycardic Abdomen: no tenderness. Bowel sounds positive.  Skin: no rashes Neurologic: CN 2-12 grossly intact. Strength 5/5 in all 4.  Psychiatric: Normal judgment and insight. Alert and oriented x 3. Normal mood.    Data Reviewed: I have independently reviewed following labs and imaging studies   CXR - changes consistent with COPD,  no acute infiltrates   CBC: Recent Labs  Lab 11/07/17 2121  WBC 10.5  HGB 15.9*  HCT 46.3*  MCV 88.2  PLT 646   Basic Metabolic Panel: Recent Labs  Lab 11/07/17 2121 11/09/17 0411  NA 138 139  K 4.1 4.3  CL 101 105  CO2 26 25  GLUCOSE 109* 158*  BUN 27* 46*  CREATININE 1.64* 1.94*  CALCIUM 9.1 8.9   GFR: Estimated Creatinine  Clearance: 25.6 mL/min (A) (by C-G formula based on SCr of 1.94 mg/dL (H)). Liver Function Tests: No results for input(s): AST, ALT, ALKPHOS, BILITOT, PROT, ALBUMIN in the last 168 hours. No results for input(s): LIPASE, AMYLASE in the last 168 hours. No results for input(s): AMMONIA in the last 168 hours. Coagulation Profile: No results for input(s): INR, PROTIME in the last 168 hours. Cardiac Enzymes: No results for input(s): CKTOTAL, CKMB, CKMBINDEX, TROPONINI in the last 168 hours. BNP (last 3 results) No results for input(s): PROBNP in the last 8760 hours. HbA1C: No results for input(s): HGBA1C in the last 72 hours. CBG: Recent Labs  Lab 11/08/17 0822 11/08/17 1534 11/08/17 2046 11/09/17 1153  GLUCAP 190* 217* 203* 138*   Lipid Profile: No results for input(s): CHOL, HDL, LDLCALC, TRIG, CHOLHDL, LDLDIRECT in the last 72 hours. Thyroid Function Tests: No results for input(s): TSH, T4TOTAL, FREET4, T3FREE, THYROIDAB in the last 72 hours. Anemia Panel: No results for input(s): VITAMINB12, FOLATE, FERRITIN, TIBC, IRON, RETICCTPCT in the last 72 hours. Urine analysis:    Component Value Date/Time   COLORURINE YELLOW 03/02/2017 2245   APPEARANCEUR HAZY (A) 03/02/2017 2245   LABSPEC 1.008 03/02/2017 2245   PHURINE 5.0 03/02/2017 2245   GLUCOSEU NEGATIVE 03/02/2017 2245   HGBUR NEGATIVE 03/02/2017 2245   BILIRUBINUR NEGATIVE 03/02/2017 2245   KETONESUR NEGATIVE 03/02/2017 2245   PROTEINUR NEGATIVE 03/02/2017 2245   UROBILINOGEN 1.0 06/16/2015 1545   NITRITE NEGATIVE 03/02/2017 2245   LEUKOCYTESUR NEGATIVE 03/02/2017 2245   Sepsis Labs: Invalid input(s): PROCALCITONIN, LACTICIDVEN  No results found for this or any previous visit (from the past 240 hour(s)).    Radiology Studies: Dg Chest 2 View  Result Date: 11/07/2017 CLINICAL DATA:  Initial evaluation for acute productive cough, shortness of breath. History of COPD, smoking. EXAM: CHEST  2 VIEW COMPARISON:  Prior  radiograph from 03/18/2017. FINDINGS: Cardiac and mediastinal silhouettes are stable in size and contour, and remain within normal limits. Aortic atherosclerosis. Lungs are hyperinflated with changes related to COPD. No superimposed focal infiltrates. No pulmonary edema or pleural effusion. No pneumothorax. No acute osseous abnormality.  Osteopenia. IMPRESSION: 1. COPD. 2. No other active cardiopulmonary disease identified. 3. Aortic atherosclerosis. Electronically Signed   By: Jeannine Boga M.D.   On: 11/07/2017 22:10     Scheduled Meds: . azithromycin  250 mg Oral Daily  . enoxaparin (LOVENOX) injection  30 mg Subcutaneous Q24H  . ipratropium-albuterol  3 mL Nebulization Q6H  . lactose free nutrition  237 mL Oral BID BM  . methylPREDNISolone (SOLU-MEDROL) injection  40 mg Intravenous Q12H  . pantoprazole  40 mg Oral Daily   Continuous Infusions: . sodium chloride 1,000 mL (11/09/17 0859)      Marzetta Board, MD, PhD [facility] Pager [phone]  If 7PM-7AM, please contact night-coverage [email or web address] Password [facility] 11/09/2017, 12:47 PM

## 2017-11-10 ENCOUNTER — Inpatient Hospital Stay (HOSPITAL_COMMUNITY): Payer: Medicare Other

## 2017-11-10 DIAGNOSIS — J9622 Acute and chronic respiratory failure with hypercapnia: Secondary | ICD-10-CM

## 2017-11-10 DIAGNOSIS — J441 Chronic obstructive pulmonary disease with (acute) exacerbation: Principal | ICD-10-CM

## 2017-11-10 DIAGNOSIS — J9621 Acute and chronic respiratory failure with hypoxia: Secondary | ICD-10-CM

## 2017-11-10 LAB — CBC
HCT: 44.4 % (ref 36.0–46.0)
Hemoglobin: 14.4 g/dL (ref 12.0–15.0)
MCH: 29.8 pg (ref 26.0–34.0)
MCHC: 32.4 g/dL (ref 30.0–36.0)
MCV: 91.9 fL (ref 78.0–100.0)
Platelets: 206 10*3/uL (ref 150–400)
RBC: 4.83 MIL/uL (ref 3.87–5.11)
RDW: 13.9 % (ref 11.5–15.5)
WBC: 17 10*3/uL — ABNORMAL HIGH (ref 4.0–10.5)

## 2017-11-10 LAB — BLOOD GAS, ARTERIAL
Acid-base deficit: 0.9 mmol/L (ref 0.0–2.0)
BICARBONATE: 26.5 mmol/L (ref 20.0–28.0)
Delivery systems: POSITIVE
Drawn by: 225631
EXPIRATORY PAP: 5
FIO2: 40
INSPIRATORY PAP: 10
O2 SAT: 98.2 %
PATIENT TEMPERATURE: 97.7
PCO2 ART: 56.6 mmHg — AB (ref 32.0–48.0)
PH ART: 7.288 — AB (ref 7.350–7.450)
RATE: 28 resp/min
pO2, Arterial: 114 mmHg — ABNORMAL HIGH (ref 83.0–108.0)

## 2017-11-10 LAB — GLUCOSE, CAPILLARY
GLUCOSE-CAPILLARY: 107 mg/dL — AB (ref 65–99)
Glucose-Capillary: 129 mg/dL — ABNORMAL HIGH (ref 65–99)
Glucose-Capillary: 90 mg/dL (ref 65–99)
Glucose-Capillary: 110 mg/dL — ABNORMAL HIGH (ref 65–99)
Glucose-Capillary: 108 mg/dL — ABNORMAL HIGH (ref 65–99)
Glucose-Capillary: 129 mg/dL — ABNORMAL HIGH (ref 65–99)

## 2017-11-10 LAB — BASIC METABOLIC PANEL
Anion gap: 8 (ref 5–15)
BUN: 47 mg/dL — ABNORMAL HIGH (ref 6–20)
CO2: 26 mmol/L (ref 22–32)
Calcium: 8.5 mg/dL — ABNORMAL LOW (ref 8.9–10.3)
Chloride: 105 mmol/L (ref 101–111)
Creatinine, Ser: 1.92 mg/dL — ABNORMAL HIGH (ref 0.44–1.00)
GFR calc Af Amer: 29 mL/min — ABNORMAL LOW (ref >60)
GFR calc non Af Amer: 25 mL/min — ABNORMAL LOW (ref >60)
Glucose, Bld: 180 mg/dL — ABNORMAL HIGH (ref 65–99)
Potassium: 4.2 mmol/L (ref 3.5–5.1)
Sodium: 139 mmol/L (ref 135–145)

## 2017-11-10 LAB — D-DIMER, QUANTITATIVE (NOT AT ARMC): D DIMER QUANT: 1.25 ug{FEU}/mL — AB (ref 0.00–0.50)

## 2017-11-10 LAB — MRSA PCR SCREENING: MRSA by PCR: NEGATIVE

## 2017-11-10 MED ORDER — HYDRALAZINE HCL 20 MG/ML IJ SOLN
10.0000 mg | Freq: Four times a day (QID) | INTRAMUSCULAR | Status: DC | PRN
  Administered 2017-11-10 – 2017-11-12 (×5): 10 mg via INTRAVENOUS
  Filled 2017-11-10 (×6): qty 1

## 2017-11-10 MED ORDER — BENZONATATE 100 MG PO CAPS: 100.0000 mg | ORAL_CAPSULE | Freq: Three times a day (TID) | ORAL | Status: DC | PRN

## 2017-11-10 MED ORDER — LABETALOL HCL 5 MG/ML IV SOLN
5.0000 mg | INTRAVENOUS | Status: DC | PRN
Start: 2017-11-10 — End: 2017-11-15
  Administered 2017-11-10 – 2017-11-13 (×5): 5 mg via INTRAVENOUS
  Filled 2017-11-10 (×5): qty 4

## 2017-11-10 MED ORDER — SODIUM CHLORIDE 0.9 % IV SOLN
INTRAVENOUS | Status: DC
  Administered 2017-11-10 (×2): via INTRAVENOUS

## 2017-11-10 MED ORDER — FENTANYL CITRATE (PF) 100 MCG/2ML IJ SOLN: 50.0000 ug | INTRAMUSCULAR | Status: DC | PRN

## 2017-11-10 MED ORDER — METOPROLOL TARTRATE 5 MG/5ML IV SOLN
10.0000 mg | Freq: Once | INTRAVENOUS | Status: AC
  Administered 2017-11-10: 10 mg via INTRAVENOUS
  Filled 2017-11-10: qty 10

## 2017-11-10 MED ORDER — PANTOPRAZOLE SODIUM 40 MG IV SOLR: 40.0000 mg | INTRAVENOUS | Status: DC

## 2017-11-10 MED ORDER — BUDESONIDE 0.25 MG/2ML IN SUSP
0.2500 mg | Freq: Two times a day (BID) | RESPIRATORY_TRACT | Status: DC
  Administered 2017-11-10: 0.25 mg via RESPIRATORY_TRACT
  Filled 2017-11-10: qty 2

## 2017-11-10 MED ORDER — CLONIDINE HCL 0.1 MG/24HR TD PTWK
0.1000 mg | MEDICATED_PATCH | TRANSDERMAL | Status: DC
  Administered 2017-11-10 – 2017-11-17 (×2): 0.1 mg via TRANSDERMAL
  Filled 2017-11-10 (×2): qty 1

## 2017-11-10 MED ORDER — BUDESONIDE 0.5 MG/2ML IN SUSP
0.5000 mg | Freq: Two times a day (BID) | RESPIRATORY_TRACT | Status: DC
  Administered 2017-11-10 – 2017-11-20 (×17): 0.5 mg via RESPIRATORY_TRACT
  Filled 2017-11-10 (×20): qty 2

## 2017-11-10 MED ORDER — METOPROLOL TARTRATE 5 MG/5ML IV SOLN
INTRAVENOUS | Status: AC
  Filled 2017-11-10: qty 5

## 2017-11-10 MED ORDER — INSULIN ASPART 100 UNIT/ML ~~LOC~~ SOLN
0.0000 [IU] | SUBCUTANEOUS | Status: DC
Start: 2017-11-10 — End: 2017-11-11
  Administered 2017-11-10: 3 [IU] via SUBCUTANEOUS

## 2017-11-10 MED ORDER — METHYLPREDNISOLONE SODIUM SUCC 125 MG IJ SOLR
60.0000 mg | Freq: Four times a day (QID) | INTRAMUSCULAR | Status: DC
  Administered 2017-11-10 – 2017-11-14 (×16): 60 mg via INTRAVENOUS
  Filled 2017-11-10 (×15): qty 2

## 2017-11-10 NOTE — Consult Note (Signed)
PULMONARY / CRITICAL CARE MEDICINE   Name: Victoria Bird MRN: [id number] DOB: April 19, 1946    ADMISSION DATE:  11/07/2017 CONSULTATION DATE:  11/10/2017  REFERRING MD:  Dr. Cruzita Lederer  CHIEF COMPLAINT:  Short of breath  HISTORY OF PRESENT ILLNESS:   72 yo female smoker presented with dyspnea, cough, fever and hypoxia.  Several family members had URI.  She was admitted for AECOPD.  Respiratory status got worse with progressive hypoxia/hypercapnia and she was placed on Bipap.  PCCM asked to assess.  PAST MEDICAL HISTORY :  She  has a past medical history of Anxiety, Chronic pain, Colon polyps, COPD (chronic obstructive pulmonary disease) ([place]), Diabetes mellitus, Diverticulitis, Diverticulosis, Emphysema lung ([place]), Gall stones, GERD (gastroesophageal reflux disease), H/O: pneumonia, High cholesterol, Hypertension, OSA (obstructive sleep apnea), and Tracheobronchitis (01/01/2012).  PAST SURGICAL HISTORY: She  has a past surgical history that includes Appendectomy; Cholecystectomy; Abdominal hysterectomy; Rotator cuff repair; Carpal tunnel release (Left); Fracture surgery; Bilateral shoulder surgery; Right knee surgery; Colonoscopy; Esophagogastroduodenoscopy; and Cataract extraction.  Allergies  Allergen Reactions  . Aspartame And Phenylalanine Nausea And Vomiting    Patient says she is allergic to all artificial sweeteners  . Doxycycline Other (See Comments)    Nausea, vomiting, HA, double vision  . Tramadol Shortness Of Breath and Nausea Only  . Codeine Itching    Has not tried benadryl to alleviate side effects  . Neurontin [Gabapentin] Other (See Comments)    Dizzy, "drugged" feeling, sleepy  . Sulfa Antibiotics Other (See Comments)    Kidney problem   . Symbicort [Budesonide-Formoterol Fumarate]     Swelling of face and inside of mouth per pt  . Penicillins Rash    Has patient had a PCN reaction causing immediate rash, facial/tongue/throat swelling, SOB or lightheadedness with  hypotension: No Has patient had a PCN reaction causing severe rash involving mucus membranes or skin necrosis: No Has patient had a PCN reaction that required hospitalization No Has patient had a PCN reaction occurring within the last 10 years: No If all of the above answers are "NO", then may proceed with Cephalosporin use.  . Prednisone Rash    Patient stated she received Prednisone while she was in the hospital and experienced a rash and "extreme pain.'    No current facility-administered medications on file prior to encounter.    Current Outpatient Medications on File Prior to Encounter  Medication Sig  . albuterol (ACCUNEB) 0.63 MG/3ML nebulizer solution Take 1 ampule by nebulization every 6 (six) hours as needed for wheezing or shortness of breath.  Marland Kitchen albuterol (PROAIR HFA) 108 (90 Base) MCG/ACT inhaler Inhale 2 puffs into the lungs every 6 (six) hours as needed for wheezing or shortness of breath.  . lactose free nutrition (BOOST PLUS) LIQD Take 237 mLs by mouth 3 (three) times daily with meals.  Marland Kitchen losartan-hydrochlorothiazide (HYZAAR) 100-12.5 MG tablet TAKE 1 TABLET BY MOUTH DAILY  . metFORMIN (GLUCOPHAGE) 500 MG tablet TAKE 1 TABLET(500 MG) BY MOUTH TWICE DAILY WITH A MEAL  . pantoprazole (PROTONIX) 40 MG tablet Take 1 tablet (40 mg total) by mouth daily.  Marland Kitchen tiotropium (SPIRIVA HANDIHALER) 18 MCG inhalation capsule Place 1 capsule (18 mcg total) into inhaler and inhale daily.  . blood glucose meter kit and supplies KIT Use to test blood sugar  . glucose blood (IGLUCOSE TEST STRIPS) test strip Use as instructed  . Lancets MISC Use to test blood sugar one daily    FAMILY HISTORY:  Her indicated that her mother is  deceased. She indicated that her father is deceased. She indicated that her maternal grandmother is deceased. She indicated that her maternal grandfather is deceased. She indicated that her paternal grandmother is deceased. She indicated that her paternal grandfather is  deceased. She indicated that the status of her neg hx is unknown.   SOCIAL HISTORY: She  reports that she has been smoking cigarettes.  She started smoking about 43 years ago. She has a 61.50 pack-year smoking history. she has never used smokeless tobacco. She reports that she does not drink alcohol or use drugs.  REVIEW OF SYSTEMS:   12 point ROS negative except above.  SUBJECTIVE:   VITAL SIGNS: BP (!) 177/53   Pulse 66   Temp 98.8 F (37.1 C) (Axillary)   Resp (!) 23   Ht '5\' 3"'  (1.6 m)   Wt 157 lb 11.2 oz (71.5 kg)   SpO2 97%   BMI 27.94 kg/m   VENTILATOR SETTINGS: Vent Mode: PCV;BIPAP FiO2 (%):  [40 %] 40 % Set Rate:  [8 bmp] 8 bmp PEEP:  [5 cmH20] 5 cmH20  INTAKE / OUTPUT: I/O last 3 completed shifts: In: 1458.3 [P.O.:420; IV Piggyback:1038.3] Out: 0   PHYSICAL EXAMINATION:  General - alert Eyes - pupils reactive ENT - Bipap mask on Cardiac - regular, no murmur Chest - decreased breath sounds, no wheeze, using some accessory muscles, able to speak in full sentences Abd - soft, non tender Ext - no edema Skin - no rashes Neuro - normal strength   LABS:  BMET Recent Labs  Lab 11/07/17 2121 11/09/17 0411 11/10/17 0257  NA 138 139 139  K 4.1 4.3 4.2  CL 101 105 105  CO2 '26 25 26  ' BUN 27* 46* 47*  CREATININE 1.64* 1.94* 1.92*  GLUCOSE 109* 158* 180*    Electrolytes Recent Labs  Lab 11/07/17 2121 11/09/17 0411 11/10/17 0257  CALCIUM 9.1 8.9 8.5*    CBC Recent Labs  Lab 11/07/17 2121 11/10/17 0257  WBC 10.5 17.0*  HGB 15.9* 14.4  HCT 46.3* 44.4  PLT 182 206    Coag's No results for input(s): APTT, INR in the last 168 hours.  Sepsis Markers No results for input(s): LATICACIDVEN, PROCALCITON, O2SATVEN in the last 168 hours.  ABG Recent Labs  Lab 11/10/17 0245 11/10/17 0540  PHART 7.155* 7.288*  PCO2ART 77.8* 56.6*  PO2ART 447* 114*    Liver Enzymes No results for input(s): AST, ALT, ALKPHOS, BILITOT, ALBUMIN in the last  168 hours.  Cardiac Enzymes No results for input(s): TROPONINI, PROBNP in the last 168 hours.  Glucose Recent Labs  Lab 11/08/17 2046 11/09/17 1153 11/09/17 1628 11/09/17 2137 11/10/17 0204 11/10/17 0804  GLUCAP 203* 138* 135* 132* 129* 90    Imaging Dg Chest Port 1 View  Result Date: 11/10/2017 CLINICAL DATA:  72 y/o  F; shortness of breath. EXAM: PORTABLE CHEST 1 VIEW COMPARISON:  11/07/2017 chest radiograph.  06/08/2016 chest CT. FINDINGS: Stable normal cardiac silhouette pole given projection and technique. Aortic atherosclerosis with calcification. Stable emphysema and chronic bronchitic changes compatible with COPD. No focal consolidation. No pleural effusion or pneumothorax. No acute osseous abnormality is evident. IMPRESSION: Stable findings of COPD. No focal consolidation. Aortic atherosclerosis. Electronically Signed   By: Kristine Garbe M.D.   On: 11/10/2017 02:47     STUDIES:  PFT 06/09/15 >> FEV1 1.08 (47%), FEV1% 49, TLC 5.26 (105%), DLCO 29%  CULTURES: Influenza PCR 2/06 >> negative  ANTIBIOTICS: Zithromax 2/06 >>   SIGNIFICANT  EVENTS: 2/06 Admit 2/09 Start on Bipap  ASSESSMENT / PLAN:  Acute on chronic hypoxic/hypercapnic respiratory failure. AECOPD. Severe COPD with emphysema. Tobacco abuse. - continue Bipap - oxygen to keep SpO2 88 to 95% - scheduled BDs - continue solumedrol - day 4 of zithromax  CKD 3. - monitor renal fx  DM type II with steroid induced hyperglycemia. - SSI  Hx of HTN. - per primary team  DVT prophylaxis - lovenox SUP - protonix Nutrition - NPO while on Bipap Goals of care - full code  Updated pt's daughter at bedside  Chesley Mires, MD [place] 11/10/2017, 10:05 AM Pager:  [phone] After 3pm call: [phone]

## 2017-11-10 NOTE — Progress Notes (Signed)
PROGRESS NOTE  Victoria Bird XBJ:[id number] DOB: 09/03/1946 DOA: 11/07/2017 PCP: Hoyt Koch, MD   LOS: 2 days   Brief Narrative / Interim history: 72 year old female with history of COPD, hypertension, type 2 diabetes mellitus, ongoing tobacco abuse who presented to the emergency room with shortness of breath, cough productive of yellow sputum  with onset of about 3 days ago, persistent, decided to come to the ED.  She states that everybody in her household is sick with upper respiratory symptoms.  Her respiratory status got a little bit worse on 2/8, decompensated on 2/9 overnight and required to be transferred to stepdown and placed on BiPAP  Assessment & Plan: Principal Problem:   COPD with acute exacerbation ([place]) Active Problems:   Hypertension   Diabetes mellitus type 2, controlled ([place])   CKD (chronic kidney disease), stage III ([place])   Tobacco abuse   COPD exacerbation with acute on chronic hypoxic and hypercarbic respiratory failure, acute respiratory acidosis -Patient with increased cough on 2/8, 2/8-2/9 overnight she had worsening respiratory status, ABG showed acute respiratory acidosis in the setting of acute hypoxic and hypercarbic respiratory failure requiring stepdown transfer and BiPAP -She seems stable on BiPAP this morning -Consulted pulmonary critical care, appreciate input -Continue supportive treatment with nebulizers, steroids, azithromycin  Type 2 diabetes mellitus -Patient refusing insulin as she was told that if she takes insulin she could no longer donate blood, she is a lifelong donor -Insisting on being on metformin, however we will have to discontinue that due to worsening creatinine -Insisting on being on a regular diet and refuses carb modified due to her allergy to artificial sweeteners, however now n.p.o. due to presence of BiPAP  Hypertension -More hypertensive since transfer to stepdown, add hydralazine as needed  Chronic kidney  disease stage III -Creatinine worse on admission 1.6, repeated 1.9, discontinue the ARB/HCTZ, discontinued metformin, and received IV fluids yesterday due to poor p.o. intake and looking generally dry -Creatinine stable at 1.9 this morning, she continued n.p.o. status due to BiPAP, provide maintenance fluids, reevaluate fluid status in the morning  Tobacco abuse -Unfortunately continues to smoke, counseled to quit   DVT prophylaxis: Lovenox Code Status: Full code Family Communication: Discussed with daughter who was present at bedside Disposition Plan: Home when ready  Consultants:   None  Procedures:   None   Antimicrobials:  Azithromycin 2/7 >>   Subjective: -Appears comfortable on BiPAP this morning, no chest pain, no shortness of breath, no abdominal pain nausea or vomiting  Objective: Vitals:   11/10/17 0700 11/10/17 0800 11/10/17 1000 11/10/17 1100  BP: (!) 177/53  (!) 195/54 (!) 165/63  Pulse: 66  85 80  Resp: (!) 23  (!) 25 (!) 22  Temp:  98.8 F (37.1 C)    TempSrc:  Axillary    SpO2: 97%  97% 97%  Weight:      Height:        Intake/Output Summary (Last 24 hours) at 11/10/2017 1138 Last data filed at 11/09/2017 1922 Gross per 24 hour  Intake 1098.33 ml  Output -  Net 1098.33 ml   Filed Weights   11/08/17 1330  Weight: 71.5 kg (157 lb 11.2 oz)    Examination:  Constitutional: Comfortable with the BiPAP on, appears anxious Eyes: No scleral icterus, pupils equally round and reactive ENMT: Dry mucous membranes Respiratory: Overall diminished breath sounds, scattered end expiratory wheezing and coarse breath sounds throughout, increased respiratory effort Cardiovascular: Regular rate and rhythm without murmurs.  No  edema.  Tachycardic  Abdomen: Soft, nontender, nondistended, bowel sounds positive Skin: No rashes Neurologic: Follows commands, moves all extremities, equal strength Psychiatric: Anxious, alert and oriented x3   Data Reviewed: I have  independently reviewed following labs and imaging studies   Chest x-ray this morning stable without infiltrates  CBC: Recent Labs  Lab 11/07/17 2121 11/10/17 0257  WBC 10.5 17.0*  HGB 15.9* 14.4  HCT 46.3* 44.4  MCV 88.2 91.9  PLT 182 992   Basic Metabolic Panel: Recent Labs  Lab 11/07/17 2121 11/09/17 0411 11/10/17 0257  NA 138 139 139  K 4.1 4.3 4.2  CL 101 105 105  CO2 26 25 26   GLUCOSE 109* 158* 180*  BUN 27* 46* 47*  CREATININE 1.64* 1.94* 1.92*  CALCIUM 9.1 8.9 8.5*   GFR: Estimated Creatinine Clearance: 25.8 mL/min (A) (by C-G formula based on SCr of 1.92 mg/dL (H)). Liver Function Tests: No results for input(s): AST, ALT, ALKPHOS, BILITOT, PROT, ALBUMIN in the last 168 hours. No results for input(s): LIPASE, AMYLASE in the last 168 hours. No results for input(s): AMMONIA in the last 168 hours. Coagulation Profile: No results for input(s): INR, PROTIME in the last 168 hours. Cardiac Enzymes: No results for input(s): CKTOTAL, CKMB, CKMBINDEX, TROPONINI in the last 168 hours. BNP (last 3 results) No results for input(s): PROBNP in the last 8760 hours. HbA1C: No results for input(s): HGBA1C in the last 72 hours. CBG: Recent Labs  Lab 11/09/17 1153 11/09/17 1628 11/09/17 2137 11/10/17 0204 11/10/17 0804  GLUCAP 138* 135* 132* 129* 90   Lipid Profile: No results for input(s): CHOL, HDL, LDLCALC, TRIG, CHOLHDL, LDLDIRECT in the last 72 hours. Thyroid Function Tests: No results for input(s): TSH, T4TOTAL, FREET4, T3FREE, THYROIDAB in the last 72 hours. Anemia Panel: No results for input(s): VITAMINB12, FOLATE, FERRITIN, TIBC, IRON, RETICCTPCT in the last 72 hours. Urine analysis:    Component Value Date/Time   COLORURINE YELLOW 03/02/2017 2245   APPEARANCEUR HAZY (A) 03/02/2017 2245   LABSPEC 1.008 03/02/2017 2245   PHURINE 5.0 03/02/2017 2245   GLUCOSEU NEGATIVE 03/02/2017 2245   HGBUR NEGATIVE 03/02/2017 2245   BILIRUBINUR NEGATIVE 03/02/2017  2245   KETONESUR NEGATIVE 03/02/2017 2245   PROTEINUR NEGATIVE 03/02/2017 2245   UROBILINOGEN 1.0 06/16/2015 1545   NITRITE NEGATIVE 03/02/2017 2245   LEUKOCYTESUR NEGATIVE 03/02/2017 2245   Sepsis Labs: Invalid input(s): PROCALCITONIN, LACTICIDVEN  No results found for this or any previous visit (from the past 240 hour(s)).    Radiology Studies: Dg Chest Port 1 View  Result Date: 11/10/2017 CLINICAL DATA:  72 y/o  F; shortness of breath. EXAM: PORTABLE CHEST 1 VIEW COMPARISON:  11/07/2017 chest radiograph.  06/08/2016 chest CT. FINDINGS: Stable normal cardiac silhouette pole given projection and technique. Aortic atherosclerosis with calcification. Stable emphysema and chronic bronchitic changes compatible with COPD. No focal consolidation. No pleural effusion or pneumothorax. No acute osseous abnormality is evident. IMPRESSION: Stable findings of COPD. No focal consolidation. Aortic atherosclerosis. Electronically Signed   By: Kristine Garbe M.D.   On: 11/10/2017 02:47     Scheduled Meds: . azithromycin  250 mg Oral Daily  . budesonide (PULMICORT) nebulizer solution  0.5 mg Nebulization BID  . enoxaparin (LOVENOX) injection  30 mg Subcutaneous Q24H  . insulin aspart  0-20 Units Subcutaneous Q4H  . ipratropium-albuterol  3 mL Nebulization Q6H  . methylPREDNISolone (SOLU-MEDROL) injection  60 mg Intravenous Q6H  . metoprolol tartrate      . [START ON 11/11/2017]  pantoprazole (PROTONIX) IV  40 mg Intravenous Q24H   Continuous Infusions: . sodium chloride 75 mL/hr at 11/10/17 0959      Marzetta Board, MD, PhD [facility] Pager [phone]  If 7PM-7AM, please contact night-coverage [email or web address] Password [email or web address] 11/10/2017, 11:38 AM

## 2017-11-10 NOTE — Progress Notes (Signed)
Called patient's daughter, Edmonia Lynch, to let her know the update on her Mom being transferred to step-down ICU.

## 2017-11-10 NOTE — Progress Notes (Signed)
Pt remains on BIPAP/NIV at this time. No distress noted pt resting.

## 2017-11-10 NOTE — Progress Notes (Signed)
Informed Dr. Cruzita Lederer about pt being hypertensive despite receiving PRN medication.  See new orders.  Irven [place], RN

## 2017-11-10 NOTE — Progress Notes (Addendum)
Patient called me to room. Stated that she felt funny. She stated that she was unable to do anything. Her BP 227/91 R28 P103. CBG 127. T98.2 axillary. Patient is diaphoretic and using her accessory muscles to breathe. Her face is flushed and her toes are purple and cold. Called RR RN, respiratory & X. [place]. Many new orders placed in Epic- CXR, ABG, etc. Patient placed on non-rebreather per RR RN. After ABG results, patient being transferred to step-down ICU and being placed on BiPAP.

## 2017-11-10 NOTE — Progress Notes (Signed)
Placed Pt on 3L [place]. She came off BIPAP at 1330. Pt is alert and talking

## 2017-11-11 LAB — GLUCOSE, CAPILLARY
GLUCOSE-CAPILLARY: 104 mg/dL — AB (ref 65–99)
GLUCOSE-CAPILLARY: 96 mg/dL (ref 65–99)
GLUCOSE-CAPILLARY: 163 mg/dL — AB (ref 65–99)
Glucose-Capillary: 100 mg/dL — ABNORMAL HIGH (ref 65–99)
Glucose-Capillary: 129 mg/dL — ABNORMAL HIGH (ref 65–99)

## 2017-11-11 LAB — CBC
HCT: 41.6 % (ref 36.0–46.0)
Hemoglobin: 13.3 g/dL (ref 12.0–15.0)
MCH: 29.4 pg (ref 26.0–34.0)
MCHC: 32 g/dL (ref 30.0–36.0)
MCV: 91.8 fL (ref 78.0–100.0)
PLATELETS: 172 10*3/uL (ref 150–400)
RBC: 4.53 MIL/uL (ref 3.87–5.11)
RDW: 14.1 % (ref 11.5–15.5)
WBC: 8.1 10*3/uL (ref 4.0–10.5)

## 2017-11-11 LAB — BASIC METABOLIC PANEL
Anion gap: 5 (ref 5–15)
BUN: 48 mg/dL — AB (ref 6–20)
CO2: 27 mmol/L (ref 22–32)
CREATININE: 1.55 mg/dL — AB (ref 0.44–1.00)
Calcium: 8.2 mg/dL — ABNORMAL LOW (ref 8.9–10.3)
Chloride: 109 mmol/L (ref 101–111)
GFR calc non Af Amer: 33 mL/min — ABNORMAL LOW (ref >60)
GFR, EST AFRICAN AMERICAN: 38 mL/min — AB (ref >60)
Glucose, Bld: 113 mg/dL — ABNORMAL HIGH (ref 65–99)
Potassium: 4.1 mmol/L (ref 3.5–5.1)
SODIUM: 141 mmol/L (ref 135–145)

## 2017-11-11 MED ORDER — PANTOPRAZOLE SODIUM 40 MG PO TBEC
40.0000 mg | DELAYED_RELEASE_TABLET | Freq: Every day | ORAL | Status: DC
  Administered 2017-11-11 – 2017-11-20 (×10): 40 mg via ORAL
  Filled 2017-11-11 (×11): qty 1

## 2017-11-11 MED ORDER — SODIUM CHLORIDE 0.9 % IV SOLN
INTRAVENOUS | Status: DC
  Administered 2017-11-11: 12:00:00 via INTRAVENOUS

## 2017-11-11 MED ORDER — HYDRALAZINE HCL 20 MG/ML IJ SOLN: 10.0000 mg | Freq: Three times a day (TID) | INTRAMUSCULAR | Status: DC

## 2017-11-11 MED ORDER — INSULIN ASPART 100 UNIT/ML ~~LOC~~ SOLN: 0.0000 [IU] | Freq: Every day | SUBCUTANEOUS | Status: DC

## 2017-11-11 MED ORDER — ENOXAPARIN SODIUM 40 MG/0.4ML ~~LOC~~ SOLN
40.0000 mg | SUBCUTANEOUS | Status: DC
  Administered 2017-11-12 – 2017-11-20 (×9): 40 mg via SUBCUTANEOUS
  Filled 2017-11-11 (×9): qty 0.4

## 2017-11-11 MED ORDER — HYDRALAZINE HCL 10 MG PO TABS
10.0000 mg | ORAL_TABLET | Freq: Three times a day (TID) | ORAL | Status: DC
  Administered 2017-11-11 – 2017-11-12 (×4): 10 mg via ORAL
  Filled 2017-11-11 (×4): qty 1

## 2017-11-11 MED ORDER — INSULIN ASPART 100 UNIT/ML ~~LOC~~ SOLN
0.0000 [IU] | Freq: Three times a day (TID) | SUBCUTANEOUS | Status: DC
  Administered 2017-11-11 – 2017-11-12 (×2): 3 [IU] via SUBCUTANEOUS
  Administered 2017-11-12: 4 [IU] via SUBCUTANEOUS
  Administered 2017-11-12 – 2017-11-13 (×2): 3 [IU] via SUBCUTANEOUS
  Administered 2017-11-13: 4 [IU] via SUBCUTANEOUS
  Administered 2017-11-14: 7 [IU] via SUBCUTANEOUS
  Administered 2017-11-14 – 2017-11-15 (×2): 3 [IU] via SUBCUTANEOUS
  Administered 2017-11-15: 7 [IU] via SUBCUTANEOUS
  Administered 2017-11-16 (×2): 4 [IU] via SUBCUTANEOUS
  Administered 2017-11-17 – 2017-11-18 (×5): 3 [IU] via SUBCUTANEOUS
  Administered 2017-11-19: 4 [IU] via SUBCUTANEOUS
  Administered 2017-11-19: 1 [IU] via SUBCUTANEOUS
  Administered 2017-11-20: 4 [IU] via SUBCUTANEOUS

## 2017-11-11 NOTE — Progress Notes (Signed)
[facility]                                                                              Patient Demographics  Victoria Bird, is a 72 y.o. female, DOB - 30-Dec-1945, ZHY:[id number]  Admit date - 11/07/2017   Admitting Physician Etta Quill, DO  Outpatient Primary MD for the patient is Hoyt Koch, MD  Outpatient specialists:   LOS - 3  days   Medical records reviewed and are as summarized below:    Chief Complaint  Patient presents with  . Shortness of Breath  . Hypoxia       Brief summary   72 year old female with history of COPD, hypertension, type 2 diabetes mellitus, ongoing tobacco abuse who presented to the emergency room with shortness of breath, cough productive of yellow sputum with onset of about 3 days ago, persistent, decided to come to the ED. She states that everybody in her household is sick with upper respiratory symptoms.  Her respiratory status got a little bit worse on 2/8, decompensated on 2/9 overnight and required to be transferred to stepdown and placed on BiPAP   Assessment & Plan    Principal Problem:   COPD with acute exacerbation ([place]) with acute on chronic hypoxic, hypercarbic respiratory failure, acute respiratory acidosis - ABG showed-acute respiratory acidosis in the setting of hypoxic and hypercarbic respiratory failure. Placed on BiPAP -Off BiPAP since yesterday afternoon -Appreciate pulmonary critical care recommendations, continue nebulizers, steroids, azithromycin - will transfer to telemetry in a.m. if no further needs of BiPAP in the next 24 hours   Active Problems:   Hypertension -BP persistently elevated, got started on clonidine patch -Since off the BiPAP, placed on oral hydralazine and IV as needed    Diabetes mellitus type 2, controlled ([place]) -Patient refuses insulin and insisted on being on metformin. -Since off the BiPAP, placed on carb modified diet -Continue sliding scale  insulin  Acute on CKD (chronic kidney disease), stage III ([name]) -Creatinine plateaued at 1.9, improving, placed on gentle hydration -Baseline creatinine 1.1-1.3    Tobacco abuse -Counseled on nicotine cessation  Code Status: Full CODE STATUS DVT Prophylaxis:  Lovenox Family Communication: Discussed in detail with the patient, all imaging results, lab results explained to the patient    Disposition Plan:  Time Spent in minutes  35 minutes  Procedures:  Bipap  Consultants:   PCCM  Antimicrobials:      Medications  Scheduled Meds: . azithromycin  250 mg Oral Daily  . budesonide (PULMICORT) nebulizer solution  0.5 mg Nebulization BID  . cloNIDine  0.1 mg Transdermal Weekly  . enoxaparin (LOVENOX) injection  30 mg Subcutaneous Q24H  . hydrALAZINE  10 mg Oral Q8H  . insulin aspart  0-20 Units Subcutaneous TID WC  . insulin aspart  0-5 Units Subcutaneous QHS  . ipratropium-albuterol  3 mL Nebulization Q6H  . methylPREDNISolone (SOLU-MEDROL) injection  60 mg Intravenous Q6H  . pantoprazole  40 mg Oral Daily   Continuous Infusions: PRN Meds:.acetaminophen **OR** acetaminophen, albuterol, fentaNYL (SUBLIMAZE) injection, hydrALAZINE, labetalol, [DISCONTINUED] ondansetron **OR** ondansetron (ZOFRAN) IV   Antibiotics   Anti-infectives (From admission,  onward)   Start     Dose/Rate Route Frequency Ordered Stop   11/09/17 1000  azithromycin (ZITHROMAX) tablet 250 mg     250 mg Oral Daily 11/08/17 0529 11/13/17 0959   11/08/17 0600  azithromycin (ZITHROMAX) tablet 500 mg     500 mg Oral  Once 11/08/17 0529 11/08/17 0612        Subjective:   Victoria Bird was seen and examined today.  Shortness of breath improving this morning, O2 sat is 94% on 4 L nasal cannula.  Off BiPAP since yesterday afternoon. Patient denies dizziness, chest pain, shortness of breath, abdominal pain, N/V/D/C, new weakness, numbess, tingling. No acute events overnight.    Objective:   Vitals:    11/11/17 0500 11/11/17 0600 11/11/17 0800 11/11/17 0829  BP: (!) 163/56 (!) 163/64    Pulse: 62 64    Resp: 20 (!) 21    Temp:   97.6 F (36.4 C)   TempSrc:   Oral   SpO2: 98% 96%  97%  Weight:      Height:        Intake/Output Summary (Last 24 hours) at 11/11/2017 1110 Last data filed at 11/11/2017 0600 Gross per 24 hour  Intake 1425 ml  Output 1300 ml  Net 125 ml     Wt Readings from Last 3 Encounters:  11/08/17 71.5 kg (157 lb 11.2 oz)  11/05/17 73.6 kg (162 lb 3.2 oz)  07/30/17 70.3 kg (155 lb)     Exam  General: Alert and oriented x 3, NAD  Eyes:   HEENT:   Cardiovascular: S1 S2 auscultated, no rubs, murmurs or gallops. Regular rate and rhythm.  Respiratory: Few scattered rhonchi/crackles  Gastrointestinal: Soft, nontender, nondistended, + bowel sounds  Ext: no pedal edema bilaterally  Neuro: AAOx3, Cr N's II- XII. Strength 5/5 upper and lower extremities bilaterally,   Musculoskeletal: No digital cyanosis, clubbing  Skin: No rashes  Psych: Normal affect and demeanor, alert and oriented x3    Data Reviewed:  I have personally reviewed following labs and imaging studies  Micro Results Recent Results (from the past 240 hour(s))  MRSA PCR Screening     Status: None   Collection Time: 11/10/17  3:40 AM  Result Value Ref Range Status   MRSA by PCR NEGATIVE NEGATIVE Final    Comment:        The GeneXpert MRSA Assay (FDA approved for NASAL specimens only), is one component of a comprehensive MRSA colonization surveillance program. It is not intended to diagnose MRSA infection nor to guide or monitor treatment for MRSA infections. Performed at [name], [place] [address], [place], [place] [address]     Radiology Reports Dg Chest 2 View  Result Date: 11/07/2017 CLINICAL DATA:  Initial evaluation for acute productive cough, shortness of breath. History of COPD, smoking. EXAM: CHEST  2 VIEW COMPARISON:  Prior radiograph from  03/18/2017. FINDINGS: Cardiac and mediastinal silhouettes are stable in size and contour, and remain within normal limits. Aortic atherosclerosis. Lungs are hyperinflated with changes related to COPD. No superimposed focal infiltrates. No pulmonary edema or pleural effusion. No pneumothorax. No acute osseous abnormality.  Osteopenia. IMPRESSION: 1. COPD. 2. No other active cardiopulmonary disease identified. 3. Aortic atherosclerosis. Electronically Signed   By: Jeannine Boga M.D.   On: 11/07/2017 22:10   Dg Chest Port 1 View  Result Date: 11/10/2017 CLINICAL DATA:  72 y/o  F; shortness of breath. EXAM: PORTABLE CHEST 1 VIEW COMPARISON:  11/07/2017  chest radiograph.  06/08/2016 chest CT. FINDINGS: Stable normal cardiac silhouette pole given projection and technique. Aortic atherosclerosis with calcification. Stable emphysema and chronic bronchitic changes compatible with COPD. No focal consolidation. No pleural effusion or pneumothorax. No acute osseous abnormality is evident. IMPRESSION: Stable findings of COPD. No focal consolidation. Aortic atherosclerosis. Electronically Signed   By: Kristine Garbe M.D.   On: 11/10/2017 02:47    Lab Data:  CBC: Recent Labs  Lab 11/07/17 2121 11/10/17 0257 11/11/17 0312  WBC 10.5 17.0* 8.1  HGB 15.9* 14.4 13.3  HCT 46.3* 44.4 41.6  MCV 88.2 91.9 91.8  PLT 182 206 734   Basic Metabolic Panel: Recent Labs  Lab 11/07/17 2121 11/09/17 0411 11/10/17 0257 11/11/17 0312  NA 138 139 139 141  K 4.1 4.3 4.2 4.1  CL 101 105 105 109  CO2 26 25 26 27   GLUCOSE 109* 158* 180* 113*  BUN 27* 46* 47* 48*  CREATININE 1.64* 1.94* 1.92* 1.55*  CALCIUM 9.1 8.9 8.5* 8.2*   GFR: Estimated Creatinine Clearance: 32 mL/min (A) (by C-G formula based on SCr of 1.55 mg/dL (H)). Liver Function Tests: No results for input(s): AST, ALT, ALKPHOS, BILITOT, PROT, ALBUMIN in the last 168 hours. No results for input(s): LIPASE, AMYLASE in the last 168  hours. No results for input(s): AMMONIA in the last 168 hours. Coagulation Profile: No results for input(s): INR, PROTIME in the last 168 hours. Cardiac Enzymes: No results for input(s): CKTOTAL, CKMB, CKMBINDEX, TROPONINI in the last 168 hours. BNP (last 3 results) No results for input(s): PROBNP in the last 8760 hours. HbA1C: No results for input(s): HGBA1C in the last 72 hours. CBG: Recent Labs  Lab 11/10/17 1603 11/10/17 1937 11/10/17 2307 11/11/17 0409 11/11/17 0805  GLUCAP 108* 107* 129* 104* 100*   Lipid Profile: No results for input(s): CHOL, HDL, LDLCALC, TRIG, CHOLHDL, LDLDIRECT in the last 72 hours. Thyroid Function Tests: No results for input(s): TSH, T4TOTAL, FREET4, T3FREE, THYROIDAB in the last 72 hours. Anemia Panel: No results for input(s): VITAMINB12, FOLATE, FERRITIN, TIBC, IRON, RETICCTPCT in the last 72 hours. Urine analysis:    Component Value Date/Time   COLORURINE YELLOW 03/02/2017 2245   APPEARANCEUR HAZY (A) 03/02/2017 2245   LABSPEC 1.008 03/02/2017 2245   PHURINE 5.0 03/02/2017 2245   GLUCOSEU NEGATIVE 03/02/2017 2245   HGBUR NEGATIVE 03/02/2017 2245   BILIRUBINUR NEGATIVE 03/02/2017 2245   KETONESUR NEGATIVE 03/02/2017 2245   PROTEINUR NEGATIVE 03/02/2017 2245   UROBILINOGEN 1.0 06/16/2015 1545   NITRITE NEGATIVE 03/02/2017 2245   LEUKOCYTESUR NEGATIVE 03/02/2017 2245     Ripudeep Rai M.D. [facility] 11/11/2017, 11:10 AM  Pager: [phone] Between 7am to 7pm - call Pager - [phone]  After 7pm go to [email or web address] - password [email or web address]  Call night coverage person covering after 7pm

## 2017-11-11 NOTE — Progress Notes (Signed)
[id number]/BULAGT Mai Longnecker,BSN,RN3,CCM/[phone]/Patient with COPD exacerbation, iv solu-medrol and nebs/will follow for discharge needs and cm needs.

## 2017-11-11 NOTE — Progress Notes (Signed)
PULMONARY / CRITICAL CARE MEDICINE   Name: Victoria Bird MRN: [id number] DOB: 1946-01-15    ADMISSION DATE:  11/07/2017 CONSULTATION DATE:  11/10/2017  REFERRING MD:  Dr. Cruzita Lederer  CHIEF COMPLAINT:  Short of breath  HISTORY OF PRESENT ILLNESS:   72 yo female smoker presented with dyspnea, cough, fever and hypoxia.  Several family members had URI.  She was admitted for AECOPD.  Respiratory status got worse with progressive hypoxia/hypercapnia and she was placed on Bipap.  PCCM asked to assess.  SUBJECTIVE:  Off Bipap since yesterday afternoon.  Feels hungry.  Not having wheeze this morning.  VITAL SIGNS: BP (!) 163/64   Pulse 64   Temp 97.6 F (36.4 C) (Oral)   Resp (!) 21   Ht 5\' 3"  (1.6 m)   Wt 157 lb 11.2 oz (71.5 kg)   SpO2 96%   BMI 27.94 kg/m   INTAKE / OUTPUT: I/O last 3 completed shifts: In: 2539.6 [I.V.:1501.3; IV Piggyback:1038.3] Out: 1300 [Urine:1300]  PHYSICAL EXAMINATION:  General - pleasant Eyes - pupils reactive ENT - no sinus tenderness, no oral exudate, no LAN Cardiac - regular, no murmur Chest - few scattered crackles Abd - soft, non tender Ext - no edema Skin - no rashes Neuro - normal strength Psych - normal mood  LABS:  BMET Recent Labs  Lab 11/09/17 0411 11/10/17 0257 11/11/17 0312  NA 139 139 141  K 4.3 4.2 4.1  CL 105 105 109  CO2 25 26 27   BUN 46* 47* 48*  CREATININE 1.94* 1.92* 1.55*  GLUCOSE 158* 180* 113*    Electrolytes Recent Labs  Lab 11/09/17 0411 11/10/17 0257 11/11/17 0312  CALCIUM 8.9 8.5* 8.2*    CBC Recent Labs  Lab 11/07/17 2121 11/10/17 0257 11/11/17 0312  WBC 10.5 17.0* 8.1  HGB 15.9* 14.4 13.3  HCT 46.3* 44.4 41.6  PLT 182 206 172    Coag's No results for input(s): APTT, INR in the last 168 hours.  Sepsis Markers No results for input(s): LATICACIDVEN, PROCALCITON, O2SATVEN in the last 168 hours.  ABG Recent Labs  Lab 11/10/17 0245 11/10/17 0540  PHART 7.155* 7.288*  PCO2ART 77.8*  56.6*  PO2ART 447* 114*    Liver Enzymes No results for input(s): AST, ALT, ALKPHOS, BILITOT, ALBUMIN in the last 168 hours.  Cardiac Enzymes No results for input(s): TROPONINI, PROBNP in the last 168 hours.  Glucose Recent Labs  Lab 11/10/17 0804 11/10/17 1226 11/10/17 1603 11/10/17 1937 11/10/17 2307 11/11/17 0409  GLUCAP 90 110* 108* 107* 129* 104*    Imaging No results found.   STUDIES:  PFT 06/09/15 >> FEV1 1.08 (47%), FEV1% 49, TLC 5.26 (105%), DLCO 29%  CULTURES: Influenza PCR 2/06 >> negative  ANTIBIOTICS: Zithromax 2/06 >>   SIGNIFICANT EVENTS: 2/06 Admit 2/09 Start on Bipap 2/10 Off Bipap  ASSESSMENT / PLAN:  Acute on chronic hypoxic/hypercapnic respiratory failure. AECOPD. Severe COPD with emphysema. Tobacco abuse. - oxygen to keep SpO2 88 to 95% - can d/c Bipap - scheduled BDs - continue solumedrol - day 5 of zithromax  CKD 3. - renal fx improving - monitor urine outpt  DM type II with steroid induced hyperglycemia. - SSI  Hx of HTN. - per primary team  DVT prophylaxis - lovenox SUP - protonix Nutrition - Carb modified diet Goals of care - full code Disposition - former patient of Dr. Ashok Cordia.  She will f/u with Dr. Lake Bells as outpt for pulmonary management.   Chesley Mires, MD [facility]  Pulmonary/Critical Care 11/11/2017, 8:27 AM Pager:  [phone] After 3pm call: [phone]

## 2017-11-12 ENCOUNTER — Telehealth: Payer: Self-pay | Admitting: Pulmonary Disease

## 2017-11-12 DIAGNOSIS — J441 Chronic obstructive pulmonary disease with (acute) exacerbation: Secondary | ICD-10-CM

## 2017-11-12 LAB — CBC
HCT: 42.3 % (ref 36.0–46.0)
Hemoglobin: 13.7 g/dL (ref 12.0–15.0)
MCH: 29.3 pg (ref 26.0–34.0)
MCHC: 32.4 g/dL (ref 30.0–36.0)
MCV: 90.6 fL (ref 78.0–100.0)
PLATELETS: 181 10*3/uL (ref 150–400)
RBC: 4.67 MIL/uL (ref 3.87–5.11)
RDW: 14.1 % (ref 11.5–15.5)
WBC: 8.4 10*3/uL (ref 4.0–10.5)

## 2017-11-12 LAB — GLUCOSE, CAPILLARY
GLUCOSE-CAPILLARY: 144 mg/dL — AB (ref 65–99)
GLUCOSE-CAPILLARY: 138 mg/dL — AB (ref 65–99)
Glucose-Capillary: 131 mg/dL — ABNORMAL HIGH (ref 65–99)

## 2017-11-12 LAB — BLOOD GAS, ARTERIAL
Acid-base deficit: 4.6 mmol/L — ABNORMAL HIGH (ref 0.0–2.0)
BICARBONATE: 26.3 mmol/L (ref 20.0–28.0)
Drawn by: 51425
FIO2: 100
O2 Saturation: 99.6 %
PATIENT TEMPERATURE: 98.6
PH ART: 7.155 — AB (ref 7.350–7.450)
PO2 ART: 447 mmHg — AB (ref 83.0–108.0)
pCO2 arterial: 77.8 mmHg (ref 32.0–48.0)

## 2017-11-12 LAB — BASIC METABOLIC PANEL
Anion gap: 9 (ref 5–15)
BUN: 52 mg/dL — AB (ref 6–20)
CHLORIDE: 106 mmol/L (ref 101–111)
CO2: 21 mmol/L — ABNORMAL LOW (ref 22–32)
CREATININE: 1.47 mg/dL — AB (ref 0.44–1.00)
Calcium: 8.4 mg/dL — ABNORMAL LOW (ref 8.9–10.3)
GFR calc Af Amer: 41 mL/min — ABNORMAL LOW (ref >60)
GFR calc non Af Amer: 35 mL/min — ABNORMAL LOW (ref >60)
GLUCOSE: 150 mg/dL — AB (ref 65–99)
Potassium: 3.9 mmol/L (ref 3.5–5.1)
Sodium: 136 mmol/L (ref 135–145)

## 2017-11-12 MED ORDER — HYDRALAZINE HCL 25 MG PO TABS
25.0000 mg | ORAL_TABLET | Freq: Three times a day (TID) | ORAL | Status: DC
  Administered 2017-11-12 – 2017-11-20 (×26): 25 mg via ORAL
  Filled 2017-11-12 (×26): qty 1

## 2017-11-12 MED ORDER — AMLODIPINE BESYLATE 5 MG PO TABS
5.0000 mg | ORAL_TABLET | Freq: Every day | ORAL | Status: DC
  Administered 2017-11-12 – 2017-11-15 (×4): 5 mg via ORAL
  Filled 2017-11-12 (×4): qty 1

## 2017-11-12 MED ORDER — ORAL CARE MOUTH RINSE
15.0000 mL | Freq: Two times a day (BID) | OROMUCOSAL | Status: DC
  Administered 2017-11-12 – 2017-11-19 (×8): 15 mL via OROMUCOSAL

## 2017-11-12 MED ORDER — BENZONATATE 100 MG PO CAPS
100.0000 mg | ORAL_CAPSULE | Freq: Three times a day (TID) | ORAL | Status: DC
  Administered 2017-11-12 – 2017-11-20 (×25): 100 mg via ORAL
  Filled 2017-11-12 (×25): qty 1

## 2017-11-12 MED ORDER — IPRATROPIUM-ALBUTEROL 0.5-2.5 (3) MG/3ML IN SOLN
3.0000 mL | RESPIRATORY_TRACT | Status: DC
  Filled 2017-11-12: qty 3

## 2017-11-12 MED ORDER — IPRATROPIUM-ALBUTEROL 0.5-2.5 (3) MG/3ML IN SOLN
3.0000 mL | Freq: Four times a day (QID) | RESPIRATORY_TRACT | Status: DC
  Administered 2017-11-12 – 2017-11-14 (×6): 3 mL via RESPIRATORY_TRACT
  Filled 2017-11-12 (×8): qty 3

## 2017-11-12 NOTE — Progress Notes (Signed)
[facility]                                                                              Patient Demographics  Victoria Bird, is a 72 y.o. female, DOB - 1946-09-05, EYC:[id number]  Admit date - 11/07/2017   Admitting Physician Etta Quill, DO  Outpatient Primary MD for the patient is Hoyt Koch, MD  Outpatient specialists:   LOS - 4  days   Medical records reviewed and are as summarized below:    Chief Complaint  Patient presents with  . Shortness of Breath  . Hypoxia       Brief summary   72 year old female with history of COPD, hypertension, type 2 diabetes mellitus, ongoing tobacco abuse who presented to the emergency room with shortness of breath, cough productive of yellow sputum with onset of about 3 days ago, persistent, decided to come to the ED. She states that everybody in her household is sick with upper respiratory symptoms.  Her respiratory status got a little bit worse on 2/8, decompensated on 2/9 overnight and required to be transferred to stepdown and placed on BiPAP   Assessment & Plan    Principal Problem:   COPD with acute exacerbation ([place]) with acute on chronic hypoxic, hypercarbic respiratory failure, acute respiratory acidosis - ABG showed-acute respiratory acidosis in the setting of hypoxic and hypercarbic respiratory failure. Placed on BiPAP.  Off BiPAP -Still diffusely wheezing.  Pulmonology following -Currently on maximized treatment with nebulizers, systemic steroids, Brovana, azithromycin, allergic to Symbicort  Active Problems:   Hypertension -BP persistently elevated, got started on clonidine patch -Added Norvasc, increase hydralazine to 25 mg tid    Diabetes mellitus type 2, controlled ([place]) -Since off the BiPAP, placed on carb modified diet -Continue sliding scale insulin  Acute on CKD (chronic kidney disease), stage III ([name]) -Creatinine plateaued at 1.9, improving, placed on gentle  hydration -Baseline creatinine 1.1-1.3    Tobacco abuse -Counseled on nicotine cessation  Code Status: Full CODE STATUS DVT Prophylaxis:  Lovenox Family Communication: Discussed in detail with the patient, all imaging results, lab results explained to the patient    Disposition Plan:  Time Spent in minutes 25 minutes  Procedures:  Bipap  Consultants:   PCCM  Antimicrobials:      Medications  Scheduled Meds: . amLODipine  5 mg Oral Daily  . budesonide (PULMICORT) nebulizer solution  0.5 mg Nebulization BID  . cloNIDine  0.1 mg Transdermal Weekly  . enoxaparin (LOVENOX) injection  40 mg Subcutaneous Q24H  . hydrALAZINE  25 mg Oral Q8H  . insulin aspart  0-20 Units Subcutaneous TID WC  . insulin aspart  0-5 Units Subcutaneous QHS  . ipratropium-albuterol  3 mL Nebulization Q6H  . mouth rinse  15 mL Mouth Rinse BID  . methylPREDNISolone (SOLU-MEDROL) injection  60 mg Intravenous Q6H  . pantoprazole  40 mg Oral Daily   Continuous Infusions: . sodium chloride 10 mL/hr at 11/12/17 1105   PRN Meds:.acetaminophen **OR** acetaminophen, albuterol, fentaNYL (SUBLIMAZE) injection, hydrALAZINE, labetalol, [DISCONTINUED] ondansetron **OR** ondansetron (ZOFRAN) IV   Antibiotics   Anti-infectives (From admission, onward)   Start  Dose/Rate Route Frequency Ordered Stop   11/09/17 1000  azithromycin (ZITHROMAX) tablet 250 mg     250 mg Oral Daily 11/08/17 0529 11/12/17 0955   11/08/17 0600  azithromycin (ZITHROMAX) tablet 500 mg     500 mg Oral  Once 11/08/17 0529 11/08/17 0612        Subjective:   Jessica Checketts was seen and examined today.  No fevers or chills, BP still continues to be high, did not need BiPAP overnight.  Diffuse wheezing.  Patient denies dizziness, chest pain, abdominal pain, N/V/D/C, new weakness, numbess, tingling.   Objective:   Vitals:   11/12/17 0900 11/12/17 1000 11/12/17 1100 11/12/17 1200  BP: (!) 185/54 (!) 170/58 (!) 165/60 (!)  175/71  Pulse: 69 82 82 86  Resp: (!) 21 (!) 23 (!) 23 18  Temp:      TempSrc:      SpO2: 96% 93% 96% 94%  Weight:      Height:        Intake/Output Summary (Last 24 hours) at 11/12/2017 1232 Last data filed at 11/12/2017 0800 Gross per 24 hour  Intake 1470 ml  Output 500 ml  Net 970 ml     Wt Readings from Last 3 Encounters:  11/08/17 71.5 kg (157 lb 11.2 oz)  11/05/17 73.6 kg (162 lb 3.2 oz)  07/30/17 70.3 kg (155 lb)     Exam   General: Alert and oriented x 3, NAD  Eyes:   HEENT:   Cardiovascular: S1 S2 auscultated, Regular rate and rhythm. No pedal edema b/l  Respiratory:  bilateral expiratory wheezing  Gastrointestinal: Soft, nontender, nondistended, + bowel sounds  Ext: no pedal edema bilaterally  Neuro: no new deficits  Musculoskeletal: No digital cyanosis, clubbing  Skin: No rashes  Psych: Normal affect and demeanor, alert and oriented x3    Data Reviewed:  I have personally reviewed following labs and imaging studies  Micro Results Recent Results (from the past 240 hour(s))  MRSA PCR Screening     Status: None   Collection Time: 11/10/17  3:40 AM  Result Value Ref Range Status   MRSA by PCR NEGATIVE NEGATIVE Final    Comment:        The GeneXpert MRSA Assay (FDA approved for NASAL specimens only), is one component of a comprehensive MRSA colonization surveillance program. It is not intended to diagnose MRSA infection nor to guide or monitor treatment for MRSA infections. Performed at [facility], [place] [address], [place], [place] [address]     Radiology Reports Dg Chest 2 View  Result Date: 11/07/2017 CLINICAL DATA:  Initial evaluation for acute productive cough, shortness of breath. History of COPD, smoking. EXAM: CHEST  2 VIEW COMPARISON:  Prior radiograph from 03/18/2017. FINDINGS: Cardiac and mediastinal silhouettes are stable in size and contour, and remain within normal limits. Aortic atherosclerosis. Lungs  are hyperinflated with changes related to COPD. No superimposed focal infiltrates. No pulmonary edema or pleural effusion. No pneumothorax. No acute osseous abnormality.  Osteopenia. IMPRESSION: 1. COPD. 2. No other active cardiopulmonary disease identified. 3. Aortic atherosclerosis. Electronically Signed   By: Jeannine Boga M.D.   On: 11/07/2017 22:10   Dg Chest Port 1 View  Result Date: 11/10/2017 CLINICAL DATA:  72 y/o  F; shortness of breath. EXAM: PORTABLE CHEST 1 VIEW COMPARISON:  11/07/2017 chest radiograph.  06/08/2016 chest CT. FINDINGS: Stable normal cardiac silhouette pole given projection and technique. Aortic atherosclerosis with calcification. Stable emphysema and chronic bronchitic changes  compatible with COPD. No focal consolidation. No pleural effusion or pneumothorax. No acute osseous abnormality is evident. IMPRESSION: Stable findings of COPD. No focal consolidation. Aortic atherosclerosis. Electronically Signed   By: Kristine Garbe M.D.   On: 11/10/2017 02:47    Lab Data:  CBC: Recent Labs  Lab 11/07/17 2121 11/10/17 0257 11/11/17 0312 11/12/17 0313  WBC 10.5 17.0* 8.1 8.4  HGB 15.9* 14.4 13.3 13.7  HCT 46.3* 44.4 41.6 42.3  MCV 88.2 91.9 91.8 90.6  PLT 182 206 172 697   Basic Metabolic Panel: Recent Labs  Lab 11/07/17 2121 11/09/17 0411 11/10/17 0257 11/11/17 0312 11/12/17 0313  NA 138 139 139 141 136  K 4.1 4.3 4.2 4.1 3.9  CL 101 105 105 109 106  CO2 26 25 26 27  21*  GLUCOSE 109* 158* 180* 113* 150*  BUN 27* 46* 47* 48* 52*  CREATININE 1.64* 1.94* 1.92* 1.55* 1.47*  CALCIUM 9.1 8.9 8.5* 8.2* 8.4*   GFR: Estimated Creatinine Clearance: 33.7 mL/min (A) (by C-G formula based on SCr of 1.47 mg/dL (H)). Liver Function Tests: No results for input(s): AST, ALT, ALKPHOS, BILITOT, PROT, ALBUMIN in the last 168 hours. No results for input(s): LIPASE, AMYLASE in the last 168 hours. No results for input(s): AMMONIA in the last 168  hours. Coagulation Profile: No results for input(s): INR, PROTIME in the last 168 hours. Cardiac Enzymes: No results for input(s): CKTOTAL, CKMB, CKMBINDEX, TROPONINI in the last 168 hours. BNP (last 3 results) No results for input(s): PROBNP in the last 8760 hours. HbA1C: No results for input(s): HGBA1C in the last 72 hours. CBG: Recent Labs  Lab 11/11/17 0805 11/11/17 1145 11/11/17 1550 11/11/17 2108 11/12/17 0812  GLUCAP 100* 129* 96 163* 131*   Lipid Profile: No results for input(s): CHOL, HDL, LDLCALC, TRIG, CHOLHDL, LDLDIRECT in the last 72 hours. Thyroid Function Tests: No results for input(s): TSH, T4TOTAL, FREET4, T3FREE, THYROIDAB in the last 72 hours. Anemia Panel: No results for input(s): VITAMINB12, FOLATE, FERRITIN, TIBC, IRON, RETICCTPCT in the last 72 hours. Urine analysis:    Component Value Date/Time   COLORURINE YELLOW 03/02/2017 2245   APPEARANCEUR HAZY (A) 03/02/2017 2245   LABSPEC 1.008 03/02/2017 2245   PHURINE 5.0 03/02/2017 2245   GLUCOSEU NEGATIVE 03/02/2017 2245   HGBUR NEGATIVE 03/02/2017 2245   BILIRUBINUR NEGATIVE 03/02/2017 2245   KETONESUR NEGATIVE 03/02/2017 2245   PROTEINUR NEGATIVE 03/02/2017 2245   UROBILINOGEN 1.0 06/16/2015 1545   NITRITE NEGATIVE 03/02/2017 2245   LEUKOCYTESUR NEGATIVE 03/02/2017 2245     Rakeen Gaillard M.D. [facility] 11/12/2017, 12:32 PM  Pager: [phone] Between 7am to 7pm - call Pager - [phone]  After 7pm go to [email or web address] - password [email or web address]  Call night coverage person covering after 7pm

## 2017-11-12 NOTE — Progress Notes (Signed)
PULMONARY / CRITICAL CARE MEDICINE   Name: Victoria Bird MRN: [id number] DOB: 28-Jan-1946    ADMISSION DATE:  11/07/2017 CONSULTATION DATE:  11/10/2017  REFERRING MD:  Dr. Cruzita Lederer  CHIEF COMPLAINT:  Short of breath  HISTORY OF PRESENT ILLNESS:   72 yo female smoker presented with dyspnea, cough, fever and hypoxia.  Several family members had URI.  She was admitted for AECOPD.  Respiratory status got worse with progressive hypoxia/hypercapnia and she was placed on Bipap.    Thepatiemnt is no longer on BIPAP. Complains of feeling tired. Has not been sleepingtoo well in the hospital.  The patient appears to be doing better. She does remain p[retty wheezy despite good doses of steroids. SUBJECTIVE:  Off Bipap since yesterday afternoon.  Feels hungry.  Not having wheeze this morning.  VITAL SIGNS: BP (!) 170/58   Pulse 82   Temp 98 F (36.7 C) (Oral)   Resp (!) 23   Ht 5\' 3"  (1.6 m)   Wt 157 lb 11.2 oz (71.5 kg)   SpO2 93%   BMI 27.94 kg/m   INTAKE / OUTPUT: I/O last 3 completed shifts: In: 2327.5 [I.V.:2327.5] Out: 1050 [Urine:1050]  PHYSICAL EXAMINATION:  General - pleasant Eyes - pupils reactive ENT - no sinus tenderness, no oral exudate, no LAN Cardiac - regular, no murmur Chest -  Bilateral moderate expiratory wheezes, more pronounced on forced expirationft,   Ext - no edema Skin - no rashes Neuro -alert and oriented, normal strength, moves all extremities Psych - normal mood  LABS:  BMET Recent Labs  Lab 11/10/17 0257 11/11/17 0312 11/12/17 0313  NA 139 141 136  K 4.2 4.1 3.9  CL 105 109 106  CO2 26 27 21*  BUN 47* 48* 52*  CREATININE 1.92* 1.55* 1.47*  GLUCOSE 180* 113* 150*    Electrolytes Recent Labs  Lab 11/10/17 0257 11/11/17 0312 11/12/17 0313  CALCIUM 8.5* 8.2* 8.4*    CBC Recent Labs  Lab 11/10/17 0257 11/11/17 0312 11/12/17 0313  WBC 17.0* 8.1 8.4  HGB 14.4 13.3 13.7  HCT 44.4 41.6 42.3  PLT 206 172 181    Coag's No  results for input(s): APTT, INR in the last 168 hours.  Sepsis Markers No results for input(s): LATICACIDVEN, PROCALCITON, O2SATVEN in the last 168 hours.  ABG Recent Labs  Lab 11/10/17 0245 11/10/17 0540  PHART 7.155* 7.288*  PCO2ART 77.8* 56.6*  PO2ART 447* 114*    Liver Enzymes No results for input(s): AST, ALT, ALKPHOS, BILITOT, ALBUMIN in the last 168 hours.  Cardiac Enzymes No results for input(s): TROPONINI, PROBNP in the last 168 hours.  Glucose Recent Labs  Lab 11/11/17 0409 11/11/17 0805 11/11/17 1145 11/11/17 1550 11/11/17 2108 11/12/17 0812  GLUCAP 104* 100* 129* 96 163* 131*    Imaging No results found.   STUDIES:  PFT 06/09/15 >> FEV1 1.08 (47%), FEV1% 49, TLC 5.26 (105%), DLCO 29%  CULTURES: Influenza PCR 2/06 >> negative  ANTIBIOTICS: Zithromax 2/06 >>   SIGNIFICANT EVENTS: 2/06 Admit 2/09 Start on Bipap 2/10 Off Bipap  ASSESSMENT / PLAN:  Acute on chronic hypoxic/hypercapnic respiratory failure. AECOPD. Severe COPD with emphysema. Tobacco abuse. - oxygen to keep SpO2 88 to 95% - can d/c Bipap - scheduled BDs - continue solumedrol - day 5 of zithromax. Difficult to transition the patient fgiven her allergy of Symbicort.  CKD 3. - renal fx improving. Creatininne has beebn coming down each day and is now 1.47 - monitor urine outpt  DM type II with steroid induced hyperglycemia. - SSI Blood sugars are reasoanbly well controlled  Hx of HTN. - per primary team Bp remains elevated likely in some part related to steroids. patiejt on Norvasc, clonidine patch. and hydral;azine just started .  DVT prophylaxis - lovenox SUP - protonix Nutrition - Carb modified diet Goals of care - full code Disposition - former patient of Dr. Ashok Cordia.  She will f/u with Dr. Lake Bells as outpt for pulmonary management.    Micheal Likens MD.

## 2017-11-12 NOTE — Telephone Encounter (Signed)
(  staff message from Marni Griffon NP copied below) ---------- Hessie Dibble!   Can you get her set up with Dr Lake Bells as out-pt. Ideally in next 2-3 weeks.   Thanks   pete  ----------  Pt is still hospitalized.  Will hold message to schedule HFU after pt is discharged.

## 2017-11-13 LAB — CBC
HCT: 42.5 % (ref 36.0–46.0)
Hemoglobin: 13.9 g/dL (ref 12.0–15.0)
MCH: 29.7 pg (ref 26.0–34.0)
MCHC: 32.7 g/dL (ref 30.0–36.0)
MCV: 90.8 fL (ref 78.0–100.0)
PLATELETS: 203 10*3/uL (ref 150–400)
RBC: 4.68 MIL/uL (ref 3.87–5.11)
RDW: 14.1 % (ref 11.5–15.5)
WBC: 8.1 10*3/uL (ref 4.0–10.5)

## 2017-11-13 LAB — GLUCOSE, CAPILLARY
GLUCOSE-CAPILLARY: 193 mg/dL — AB (ref 65–99)
GLUCOSE-CAPILLARY: 148 mg/dL — AB (ref 65–99)
GLUCOSE-CAPILLARY: 127 mg/dL — AB (ref 65–99)
Glucose-Capillary: 119 mg/dL — ABNORMAL HIGH (ref 65–99)
Glucose-Capillary: 166 mg/dL — ABNORMAL HIGH (ref 65–99)

## 2017-11-13 LAB — BASIC METABOLIC PANEL
Anion gap: 7 (ref 5–15)
BUN: 50 mg/dL — AB (ref 6–20)
CALCIUM: 8.7 mg/dL — AB (ref 8.9–10.3)
CO2: 25 mmol/L (ref 22–32)
CREATININE: 1.3 mg/dL — AB (ref 0.44–1.00)
Chloride: 109 mmol/L (ref 101–111)
GFR calc Af Amer: 47 mL/min — ABNORMAL LOW (ref >60)
GFR, EST NON AFRICAN AMERICAN: 41 mL/min — AB (ref >60)
Glucose, Bld: 165 mg/dL — ABNORMAL HIGH (ref 65–99)
Potassium: 4.3 mmol/L (ref 3.5–5.1)
SODIUM: 141 mmol/L (ref 135–145)

## 2017-11-13 MED ORDER — HYDRALAZINE HCL 20 MG/ML IJ SOLN
10.0000 mg | INTRAMUSCULAR | Status: DC | PRN
  Administered 2017-11-13 (×3): 10 mg via INTRAVENOUS
  Filled 2017-11-13 (×3): qty 1

## 2017-11-13 MED ORDER — LABETALOL HCL 100 MG PO TABS
100.0000 mg | ORAL_TABLET | Freq: Two times a day (BID) | ORAL | Status: DC
  Administered 2017-11-13 – 2017-11-20 (×15): 100 mg via ORAL
  Filled 2017-11-13 (×16): qty 1

## 2017-11-13 NOTE — Progress Notes (Signed)
PROGRESS NOTE  Victoria Bird OTL:[id number] DOB: 11-Jul-1946 DOA: 11/07/2017 PCP: Hoyt Koch, MD   LOS: 5 days   Brief Narrative / Interim history: 72 year old female with history of COPD, hypertension, type 2 diabetes mellitus, ongoing tobacco abuse who presented to the emergency room with shortness of breath, cough productive of yellow sputum  with onset of about 3 days ago, persistent, decided to come to the ED.  She states that everybody in her household is sick with upper respiratory symptoms.  Her respiratory status got a little bit worse on 2/8, decompensated on 2/9 overnight and required to be transferred to stepdown and placed on BiPAP  Assessment & Plan: Principal Problem:   COPD with acute exacerbation ([place]) Active Problems:   Hypertension   Diabetes mellitus type 2, controlled ([place])   CKD (chronic kidney disease), stage III ([place])   Tobacco abuse   COPD exacerbation with acute on chronic hypoxic and hypercarbic respiratory failure, acute respiratory acidosis -Patient with increased cough on 2/8, 2/8-2/9 overnight she had worsening respiratory status, ABG showed acute respiratory acidosis in the setting of acute hypoxic and hypercarbic respiratory failure requiring stepdown transfer and BiPAP -She has been off BiPAP, pulmonary was consulted -Appears stable this morning, slightly improved from when I last saw her, however still with wheezing -Continue nebulizers, steroids, Brovana, azithromycin.  Allergic to Symbicort  Type 2 diabetes mellitus -Patient refusing insulin initially as she was told that if she takes insulin she could no longer donate blood, she is a lifelong donor -Continue sliding scale insulin, she has been using insulin since transferred to ICU  Hypertension -Persistently hypertensive, added labetalol this morning  Chronic kidney disease stage III -Creatinine worse on admission 1.6, worsening to 1.9, now improved with fluids and is 1.3 this  morning  Tobacco abuse -Unfortunately continues to smoke, counseled to quit   DVT prophylaxis: Lovenox Code Status: Full code Family Communication: No family at bedside Disposition Plan: Home when ready  Consultants:   None  Procedures:   None   Antimicrobials:  Azithromycin 2/7 >> plan to stop after 2/12  Subjective: - no chest pain, no abdominal pain, nausea or vomiting.  Still short of breath but improved  Objective: Vitals:   11/13/17 1000 11/13/17 1100 11/13/17 1200 11/13/17 1300  BP: (!) 168/67 (!) 187/74 124/87 (!) 146/50  Pulse: 86 83 68 65  Resp: (!) 23 (!) 26 (!) 23 (!) 21  Temp:   98.1 F (36.7 C)   TempSrc:   Oral   SpO2: 93% 92% 91% 93%  Weight:      Height:        Intake/Output Summary (Last 24 hours) at 11/13/2017 1432 Last data filed at 11/13/2017 1200 Gross per 24 hour  Intake 90 ml  Output 1470 ml  Net -1380 ml   Filed Weights   11/08/17 1330  Weight: 71.5 kg (157 lb 11.2 oz)    Examination:  Constitutional: No distress Eyes: No scleral icterus ENMT: Moist mucous membranes Respiratory: Overall diminished breath sounds, minimal wheezing bilaterally, no crackles Cardiovascular: Regular rate and rhythm no murmurs, no edema. Abdomen: Soft, nontender, nondistended, positive bowel sounds Skin: No rashes Neurologic: Nonfocal, equal strength Psychiatric: Alert and oriented x3   Data Reviewed: I have independently reviewed following labs and imaging studies   CBC: Recent Labs  Lab 11/07/17 2121 11/10/17 0257 11/11/17 0312 11/12/17 0313 11/13/17 0324  WBC 10.5 17.0* 8.1 8.4 8.1  HGB 15.9* 14.4 13.3 13.7 13.9  HCT 46.3* 44.4  41.6 42.3 42.5  MCV 88.2 91.9 91.8 90.6 90.8  PLT 182 206 172 181 371   Basic Metabolic Panel: Recent Labs  Lab 11/09/17 0411 11/10/17 0257 11/11/17 0312 11/12/17 0313 11/13/17 0324  NA 139 139 141 136 141  K 4.3 4.2 4.1 3.9 4.3  CL 105 105 109 106 109  CO2 25 26 27  21* 25  GLUCOSE 158* 180* 113*  150* 165*  BUN 46* 47* 48* 52* 50*  CREATININE 1.94* 1.92* 1.55* 1.47* 1.30*  CALCIUM 8.9 8.5* 8.2* 8.4* 8.7*   GFR: Estimated Creatinine Clearance: 38.1 mL/min (A) (by C-G formula based on SCr of 1.3 mg/dL (H)). Liver Function Tests: No results for input(s): AST, ALT, ALKPHOS, BILITOT, PROT, ALBUMIN in the last 168 hours. No results for input(s): LIPASE, AMYLASE in the last 168 hours. No results for input(s): AMMONIA in the last 168 hours. Coagulation Profile: No results for input(s): INR, PROTIME in the last 168 hours. Cardiac Enzymes: No results for input(s): CKTOTAL, CKMB, CKMBINDEX, TROPONINI in the last 168 hours. BNP (last 3 results) No results for input(s): PROBNP in the last 8760 hours. HbA1C: No results for input(s): HGBA1C in the last 72 hours. CBG: Recent Labs  Lab 11/12/17 1208 11/12/17 1742 11/12/17 2114 11/13/17 0814 11/13/17 1201  GLUCAP 193* 144* 138* 148* 119*   Lipid Profile: No results for input(s): CHOL, HDL, LDLCALC, TRIG, CHOLHDL, LDLDIRECT in the last 72 hours. Thyroid Function Tests: No results for input(s): TSH, T4TOTAL, FREET4, T3FREE, THYROIDAB in the last 72 hours. Anemia Panel: No results for input(s): VITAMINB12, FOLATE, FERRITIN, TIBC, IRON, RETICCTPCT in the last 72 hours. Urine analysis:    Component Value Date/Time   COLORURINE YELLOW 03/02/2017 2245   APPEARANCEUR HAZY (A) 03/02/2017 2245   LABSPEC 1.008 03/02/2017 2245   PHURINE 5.0 03/02/2017 2245   GLUCOSEU NEGATIVE 03/02/2017 2245   HGBUR NEGATIVE 03/02/2017 2245   BILIRUBINUR NEGATIVE 03/02/2017 2245   KETONESUR NEGATIVE 03/02/2017 2245   PROTEINUR NEGATIVE 03/02/2017 2245   UROBILINOGEN 1.0 06/16/2015 1545   NITRITE NEGATIVE 03/02/2017 2245   LEUKOCYTESUR NEGATIVE 03/02/2017 2245   Sepsis Labs: Invalid input(s): PROCALCITONIN, LACTICIDVEN  Recent Results (from the past 240 hour(s))  MRSA PCR Screening     Status: None   Collection Time: 11/10/17  3:40 AM  Result Value  Ref Range Status   MRSA by PCR NEGATIVE NEGATIVE Final    Comment:        The GeneXpert MRSA Assay (FDA approved for NASAL specimens only), is one component of a comprehensive MRSA colonization surveillance program. It is not intended to diagnose MRSA infection nor to guide or monitor treatment for MRSA infections. Performed at [facility], [place] [address], [place], [place] [address]       Radiology Studies: No results found.   Scheduled Meds: . amLODipine  5 mg Oral Daily  . benzonatate  100 mg Oral TID  . budesonide (PULMICORT) nebulizer solution  0.5 mg Nebulization BID  . cloNIDine  0.1 mg Transdermal Weekly  . enoxaparin (LOVENOX) injection  40 mg Subcutaneous Q24H  . hydrALAZINE  25 mg Oral Q8H  . insulin aspart  0-20 Units Subcutaneous TID WC  . insulin aspart  0-5 Units Subcutaneous QHS  . ipratropium-albuterol  3 mL Nebulization Q6H  . labetalol  100 mg Oral BID  . mouth rinse  15 mL Mouth Rinse BID  . methylPREDNISolone (SOLU-MEDROL) injection  60 mg Intravenous Q6H  . pantoprazole  40 mg Oral Daily  Continuous Infusions: . sodium chloride 10 mL/hr at 11/12/17 2100      Marzetta Board, MD, PhD [facility] Pager [phone]  If 7PM-7AM, please contact night-coverage [email or web address] Password [facility] 11/13/2017, 2:32 PM

## 2017-11-13 NOTE — Progress Notes (Signed)
eLink Physician-Brief Progress Note Patient Name: RIONNA FELTES DOB: 12-07-1945 MRN: [id number]   Date of Service  11/13/2017  HPI/Events of Note  Hypertension - BP = 195/79.   eICU Interventions  Will order: 1. Increase frequency of Hydralazine IV to Q 4 hours PRN SBP > 170.Marland Kitchen      Intervention Category Major Interventions: Hypertension - evaluation and management  Adamariz Gillott [place] 11/13/2017, 2:42 AM

## 2017-11-13 NOTE — Progress Notes (Signed)
PULMONARY / CRITICAL CARE MEDICINE   Name: Victoria Bird MRN: [id number] DOB: 06-Sep-1946    ADMISSION DATE:  11/07/2017 CONSULTATION DATE:  11/10/2017  REFERRING MD:  Dr. Cruzita Lederer  CHIEF COMPLAINT:  Short of breath  HISTORY OF PRESENT ILLNESS:   72 yo female smoker presented with dyspnea, cough, fever and hypoxia.  Several family members had URI.  She was admitted for AECOPD.  Respiratory status got worse with progressive hypoxia/hypercapnia and she was placed on Bipap.    The patient is no longer on BIPAP. Complains of feeling tired. Has not been sleeping too well in the hospital.  The patient appears to be doing better. She does remain pretty wheezy despite good doses of steroids. SUBJECTIVE:  Off Bipap since yesterday afternoon.  Feels hungry.  Not having wheeze this morning.  VITAL SIGNS: BP (!) 196/62   Pulse 78   Temp 98 F (36.7 C) (Oral)   Resp 19   Ht 5\' 3"  (1.6 m)   Wt 157 lb 11.2 oz (71.5 kg)   SpO2 93%   BMI 27.94 kg/m   INTAKE / OUTPUT: I/O last 3 completed shifts: In: 1560 [P.O.:120; I.V.:1440] Out: 1420 [Urine:1420]  PHYSICAL EXAMINATION:  General - pleasant Eyes - pupils reactive ENT - no sinus tenderness, no oral exudate, no LAN Cardiac - regular, no murmur Chest -  Bilateral moderate expiratory wheezes, more pronounced on forced expirationft,   Ext - no edema Skin - no rashes Neuro -alert and oriented, normal strength, moves all extremities Psych - normal mood  LABS:  BMET Recent Labs  Lab 11/11/17 0312 11/12/17 0313 11/13/17 0324  NA 141 136 141  K 4.1 3.9 4.3  CL 109 106 109  CO2 27 21* 25  BUN 48* 52* 50*  CREATININE 1.55* 1.47* 1.30*  GLUCOSE 113* 150* 165*    Electrolytes Recent Labs  Lab 11/11/17 0312 11/12/17 0313 11/13/17 0324  CALCIUM 8.2* 8.4* 8.7*    CBC Recent Labs  Lab 11/11/17 0312 11/12/17 0313 11/13/17 0324  WBC 8.1 8.4 8.1  HGB 13.3 13.7 13.9  HCT 41.6 42.3 42.5  PLT 172 181 203    Coag's No  results for input(s): APTT, INR in the last 168 hours.  Sepsis Markers No results for input(s): LATICACIDVEN, PROCALCITON, O2SATVEN in the last 168 hours.  ABG Recent Labs  Lab 11/10/17 0245 11/10/17 0540  PHART 7.155* 7.288*  PCO2ART 77.8* 56.6*  PO2ART 447* 114*    Liver Enzymes No results for input(s): AST, ALT, ALKPHOS, BILITOT, ALBUMIN in the last 168 hours.  Cardiac Enzymes No results for input(s): TROPONINI, PROBNP in the last 168 hours.  Glucose Recent Labs  Lab 11/11/17 1550 11/11/17 2108 11/12/17 0812 11/12/17 1208 11/12/17 1742 11/12/17 2114  GLUCAP 96 163* 131* 193* 144* 138*    Imaging No results found.   STUDIES:  PFT 06/09/15 >> FEV1 1.08 (47%), FEV1% 49, TLC 5.26 (105%), DLCO 29%  CULTURES: Influenza PCR 2/06 >> negative  ANTIBIOTICS: Zithromax 2/06 >>   SIGNIFICANT EVENTS: 2/06 Admit 2/09 Start on Bipap 2/10 Off Bipap  ASSESSMENT / PLAN:  Acute on chronic hypoxic/hypercapnic respiratory failure. AECOPD. Severe COPD with emphysema. The patient still has a rather bronchospastic cough. Patient is short of breath on minor activity Pulse ox 93-94% on 02. Can probably be downgraded form the ICU.  CKD 3. - renal fx improving. Creatininne has been coming down each day and is now 1.47 - monitor urine outpt. 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 - SSI Blood sugars are reasoanbly well controlled  Hx of HTN. BP is still running a bit high. - per primary team Bp remains elevated likely in some part related to steroids. Patient on Norvasc, clonidine patch. and hydralazine just started.  DVT prophylaxis - lovenox SUP - protonix Nutrition - Carb modified diet Goals of care - full code Disposition - former patient of Dr. Ashok Cordia.  She will f/u with Dr. Lake Bells as outpt for pulmonary management.    Micheal Likens MD.

## 2017-11-13 NOTE — Progress Notes (Signed)
Pt educated and encouraged on turning and repositioning in bed to prevent skin breakdown. Pt refuses to make significant change in position stating "I can't even eat food without not being able to breath, I'm not rolling around in this bed more than I have to". Pt also educated on importance of getting out of bed even if just to the chair. Pt stated she was unable to do that. Will continue to encourage turns.

## 2017-11-14 LAB — CBC
HEMATOCRIT: 42.3 % (ref 36.0–46.0)
Hemoglobin: 13.9 g/dL (ref 12.0–15.0)
MCH: 29.7 pg (ref 26.0–34.0)
MCHC: 32.9 g/dL (ref 30.0–36.0)
MCV: 90.4 fL (ref 78.0–100.0)
PLATELETS: 193 10*3/uL (ref 150–400)
RBC: 4.68 MIL/uL (ref 3.87–5.11)
RDW: 14 % (ref 11.5–15.5)
WBC: 8.8 10*3/uL (ref 4.0–10.5)

## 2017-11-14 LAB — GLUCOSE, CAPILLARY
GLUCOSE-CAPILLARY: 232 mg/dL — AB (ref 65–99)
GLUCOSE-CAPILLARY: 121 mg/dL — AB (ref 65–99)
Glucose-Capillary: 143 mg/dL — ABNORMAL HIGH (ref 65–99)

## 2017-11-14 LAB — BASIC METABOLIC PANEL
Anion gap: 8 (ref 5–15)
BUN: 54 mg/dL — AB (ref 6–20)
CHLORIDE: 107 mmol/L (ref 101–111)
CO2: 23 mmol/L (ref 22–32)
Calcium: 8.5 mg/dL — ABNORMAL LOW (ref 8.9–10.3)
Creatinine, Ser: 1.41 mg/dL — ABNORMAL HIGH (ref 0.44–1.00)
GFR calc Af Amer: 43 mL/min — ABNORMAL LOW (ref >60)
GFR calc non Af Amer: 37 mL/min — ABNORMAL LOW (ref >60)
Glucose, Bld: 192 mg/dL — ABNORMAL HIGH (ref 65–99)
POTASSIUM: 4.4 mmol/L (ref 3.5–5.1)
Sodium: 138 mmol/L (ref 135–145)

## 2017-11-14 MED ORDER — GUAIFENESIN ER 600 MG PO TB12
1200.0000 mg | ORAL_TABLET | Freq: Two times a day (BID) | ORAL | Status: DC
  Administered 2017-11-14 – 2017-11-20 (×13): 1200 mg via ORAL
  Filled 2017-11-14 (×13): qty 2

## 2017-11-14 MED ORDER — METHYLPREDNISOLONE SODIUM SUCC 125 MG IJ SOLR
60.0000 mg | Freq: Three times a day (TID) | INTRAMUSCULAR | Status: DC
  Administered 2017-11-14 – 2017-11-15 (×2): 60 mg via INTRAVENOUS
  Filled 2017-11-14 (×3): qty 2

## 2017-11-14 MED ORDER — IPRATROPIUM-ALBUTEROL 0.5-2.5 (3) MG/3ML IN SOLN
3.0000 mL | Freq: Three times a day (TID) | RESPIRATORY_TRACT | Status: DC
  Administered 2017-11-15 – 2017-11-17 (×5): 3 mL via RESPIRATORY_TRACT
  Filled 2017-11-14 (×6): qty 3

## 2017-11-14 NOTE — Progress Notes (Signed)
PULMONARY / CRITICAL CARE MEDICINE   Name: Victoria Bird MRN: [id number] DOB: [birth year]    ADMISSION DATE:  11/07/2017 CONSULTATION DATE:  11/10/2017  REFERRING MD:  Dr. Cruzita Lederer  CHIEF COMPLAINT:  Short of breath  HISTORY OF PRESENT ILLNESS:   72 yo female smoker presented with dyspnea, cough, fever and hypoxia.  Several family members had URI.  She was admitted for AECOPD.  Respiratory status got worse with progressive hypoxia/hypercapnia and she was placed on Bipap.    The patient is no longer on BIPAP. Complains of feeling tired. Has not been sleeping too well in the hospital.  The patient appears to be doing better. She does remain pretty wheezy despite good doses of steroids.   SUBJECTIVE:  Pt reports she was still smoking up to day of admit.  States the "mask scared the crap out of me" and I am quitting.  Reports feeling better but not back to baseline.  Remains on 4L HFNC.   VITAL SIGNS: BP (!) 150/65   Pulse 74   Temp 97.9 F (36.6 C) (Oral)   Resp 20   Ht 5\' 3"  (1.6 m)   Wt 157 lb 11.2 oz (71.5 kg)   SpO2 93%   BMI 27.94 kg/m   INTAKE / OUTPUT: I/O last 3 completed shifts: In: 600 [P.O.:240; I.V.:360] Out: 2070 [Urine:2070]  PHYSICAL EXAMINATION: General: chronically ill appearing female in NAD, lying in bed HEENT: MM pink/moist PSY: mildly anxious  Neuro: AAOx4, speech clear, MAE  CV: s1s2 rrr, no m/r/g PULM: even/non-labored, lungs bilaterally with mild diffuse wheezing  WU:JWJX, non-tender, bsx4 active  Extremities: warm/dry, no edema  Skin: no rashes or lesions   LABS:  BMET Recent Labs  Lab 11/12/17 0313 11/13/17 0324 11/14/17 0300  NA 136 141 138  K 3.9 4.3 4.4  CL 106 109 107  CO2 21* 25 23  BUN 52* 50* 54*  CREATININE 1.47* 1.30* 1.41*  GLUCOSE 150* 165* 192*    Electrolytes Recent Labs  Lab 11/12/17 0313 11/13/17 0324 11/14/17 0300  CALCIUM 8.4* 8.7* 8.5*    CBC Recent Labs  Lab 11/12/17 0313 11/13/17 0324  11/14/17 0300  WBC 8.4 8.1 8.8  HGB 13.7 13.9 13.9  HCT 42.3 42.5 42.3  PLT 181 203 193    Coag's No results for input(s): APTT, INR in the last 168 hours.  Sepsis Markers No results for input(s): LATICACIDVEN, PROCALCITON, O2SATVEN in the last 168 hours.  ABG Recent Labs  Lab 11/10/17 0245 11/10/17 0540  PHART 7.155* 7.288*  PCO2ART 77.8* 56.6*  PO2ART 447* 114*    Liver Enzymes No results for input(s): AST, ALT, ALKPHOS, BILITOT, ALBUMIN in the last 168 hours.  Cardiac Enzymes No results for input(s): TROPONINI, PROBNP in the last 168 hours.  Glucose Recent Labs  Lab 11/12/17 2114 11/13/17 0814 11/13/17 1201 11/13/17 1722 11/13/17 2112 11/14/17 0808  GLUCAP 138* 148* 119* 166* 127* 143*    Imaging No results found.   STUDIES:  PFT 06/09/15 >> FEV1 1.08 (47%), FEV1% 49, TLC 5.26 (105%), DLCO 29%  CULTURES: Influenza PCR 2/06 >> negative  ANTIBIOTICS: Zithromax 2/06 >>   SIGNIFICANT EVENTS: 2/06 Admit 2/09 Start on Bipap 2/10 Off Bipap  ASSESSMENT / PLAN:  Acute on chronic hypoxic/hypercapnic respiratory failure. AECOPD. Severe COPD with Emphysema - NOTE allergy to symbicort P: Continue pulmicort BID Duoneb Q6  Reduce Solumedrol to 60 mg IV Q8 > could transition to medrol while inpatient to ensure tolerance as she is allergic (  rash / pain) to prednisone.  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 P: Defer to primary  SSI   Hx of HTN. P: Per  primary  Monitor BP  Continue norvasc, clonidine, hydralazine    DVT prophylaxis - lovenox SUP - protonix Nutrition - Carb modified diet Goals of care - full code Disposition - former patient of Dr. Ashok Cordia.  She will f/u with Dr. Lake Bells for primary as outpt for pulmonary management (arranged).   Ok to transition to floor from pulmonary standpoint.  Will defer to Primary / TRH.   PCCM will be available PRN.  Please call back if new needs arise.    Noe Gens, NP-C [facility] Pgr: [phone] or if no answer [phone] 11/14/2017, 9:41 AM

## 2017-11-14 NOTE — Telephone Encounter (Signed)
She has a follow up scheduled on 2/28 with SG.

## 2017-11-14 NOTE — Progress Notes (Signed)
PROGRESS NOTE    Victoria Bird  [id number]:[id number] DOB: [birth year] DOA: 11/07/2017 PCP: Hoyt Koch, MD   Brief Narrative:  72 year old female with history of COPD, hypertension, type 2 diabetes mellitus, ongoing tobacco abuse who presented to the emergency room with shortness of breath, cough productive of yellow sputum with onset of about 3 days ago, persistent, decided to come to the ED. She states that everybody in her household is sick with upper respiratory symptoms.  Her respiratory status got Nikira Kushnir little bit worse on 2/8, decompensated on 2/9 overnight and required to be transferred to stepdown and placed on BiPAP.  Assessment & Plan:   Principal Problem:   COPD with acute exacerbation ([place]) Active Problems:   Hypertension   Diabetes mellitus type 2, controlled ([place])   CKD (chronic kidney disease), stage III ([name])   Tobacco abuse   COPD exacerbation with acute on chronic hypoxic and hypercarbic respiratory failure, acute respiratory acidosis No O2 at baseline -Patient with increased cough on 2/8, 2/8-2/9 overnight she had worsening respiratory status, ABG showed acute respiratory acidosis in the setting of acute hypoxic and hypercarbic respiratory failure requiring stepdown transfer and BiPAP -She has been off BiPAP, pulmonary was consulted (appreciate recs) - s/p 5 days azithromycin  -Seems to be improving - taper steroids -Continue nebulizers, Brovana.  Allergic to Symbicort  Type 2 diabetes mellitus -Patient refusing insulin initially as she was told that if she takes insulin she could no longer donate blood, she is Hamad Whyte lifelong donor -Continue sliding scale insulin, she has been using insulin since transferred to ICU  Hypertension -Persistently hypertensive, s/p addition of labetalol this morning - still room to increase amlodipine, will consider this next  Chronic kidney disease stage III - Baseline 1.3-1.6 on admission, peaked to 1.92 here - continue to  monitor, fluctuating   Tobacco abuse -Unfortunately continues to smoke, counseled to quit by previous provider  DVT prophylaxis: heparin  Code Status: full  Family Communication: none at bedside Disposition Plan: pending   Consultants:   PCCM  Procedures:   none  Antimicrobials:  Anti-infectives (From admission, onward)   Start     Dose/Rate Route Frequency Ordered Stop   11/09/17 1000  azithromycin (ZITHROMAX) tablet 250 mg     250 mg Oral Daily 11/08/17 0529 11/12/17 0955   11/08/17 0600  azithromycin (ZITHROMAX) tablet 500 mg     500 mg Oral  Once 11/08/17 0529 11/08/17 0612         Subjective: Denies pain. Feels like generally she may be trending in positive direction, but feels about the same as yesterday regarding breathing. "Just tired, exhausted". No O2 at baseline.   Objective: Vitals:   11/14/17 0400 11/14/17 0500 11/14/17 0600 11/14/17 0700  BP: (!) 141/81 (!) 147/46 (!) 149/60 (!) 150/65  Pulse: 73 65 82 74  Resp: (!) 22 18 15 20   Temp:      TempSrc:      SpO2: 93% 96% 93% 93%  Weight:      Height:        Intake/Output Summary (Last 24 hours) at 11/14/2017 0811 Last data filed at 11/14/2017 0700 Gross per 24 hour  Intake 510 ml  Output 1550 ml  Net -1040 ml   Filed Weights   11/08/17 1330  Weight: 71.5 kg (157 lb 11.2 oz)    Examination:  General exam: Appears calm and comfortable  Respiratory system: Diffuse wheezing, increased WOB Cardiovascular system: S1 & S2 heard, RRR. No JVD, murmurs,  rubs, gallops or clicks. No pedal edema. Gastrointestinal system: Abdomen is nondistended, soft and nontender. No organomegaly or masses felt. Normal bowel sounds heard. Central nervous system: Alert and oriented. No focal neurological deficits. Extremities: no LEE Skin: No rashes, lesions or ulcers Psychiatry: Judgement and insight appear normal. Mood & affect appropriate.     Data Reviewed: I have personally reviewed following labs and imaging  studies  CBC: Recent Labs  Lab 11/10/17 0257 11/11/17 0312 11/12/17 0313 11/13/17 0324 11/14/17 0300  WBC 17.0* 8.1 8.4 8.1 8.8  HGB 14.4 13.3 13.7 13.9 13.9  HCT 44.4 41.6 42.3 42.5 42.3  MCV 91.9 91.8 90.6 90.8 90.4  PLT 206 172 181 203 875   Basic Metabolic Panel: Recent Labs  Lab 11/10/17 0257 11/11/17 0312 11/12/17 0313 11/13/17 0324 11/14/17 0300  NA 139 141 136 141 138  K 4.2 4.1 3.9 4.3 4.4  CL 105 109 106 109 107  CO2 26 27 21* 25 23  GLUCOSE 180* 113* 150* 165* 192*  BUN 47* 48* 52* 50* 54*  CREATININE 1.92* 1.55* 1.47* 1.30* 1.41*  CALCIUM 8.5* 8.2* 8.4* 8.7* 8.5*   GFR: Estimated Creatinine Clearance: 35.2 mL/min (Evony Rezek) (by C-G formula based on SCr of 1.41 mg/dL (H)). Liver Function Tests: No results for input(s): AST, ALT, ALKPHOS, BILITOT, PROT, ALBUMIN in the last 168 hours. No results for input(s): LIPASE, AMYLASE in the last 168 hours. No results for input(s): AMMONIA in the last 168 hours. Coagulation Profile: No results for input(s): INR, PROTIME in the last 168 hours. Cardiac Enzymes: No results for input(s): CKTOTAL, CKMB, CKMBINDEX, TROPONINI in the last 168 hours. BNP (last 3 results) No results for input(s): PROBNP in the last 8760 hours. HbA1C: No results for input(s): HGBA1C in the last 72 hours. CBG: Recent Labs  Lab 11/13/17 0814 11/13/17 1201 11/13/17 1722 11/13/17 2112 11/14/17 0808  GLUCAP 148* 119* 166* 127* 143*   Lipid Profile: No results for input(s): CHOL, HDL, LDLCALC, TRIG, CHOLHDL, LDLDIRECT in the last 72 hours. Thyroid Function Tests: No results for input(s): TSH, T4TOTAL, FREET4, T3FREE, THYROIDAB in the last 72 hours. Anemia Panel: No results for input(s): VITAMINB12, FOLATE, FERRITIN, TIBC, IRON, RETICCTPCT in the last 72 hours. Sepsis Labs: No results for input(s): PROCALCITON, LATICACIDVEN in the last 168 hours.  Recent Results (from the past 240 hour(s))  MRSA PCR Screening     Status: None   Collection  Time: 11/10/17  3:40 AM  Result Value Ref Range Status   MRSA by PCR NEGATIVE NEGATIVE Final    Comment:        The GeneXpert MRSA Assay (FDA approved for NASAL specimens only), is one component of Harald Quevedo comprehensive MRSA colonization surveillance program. It is not intended to diagnose MRSA infection nor to guide or monitor treatment for MRSA infections. Performed at [facility], [place] [address], [place], [place] [address]          Radiology Studies: No results found.      Scheduled Meds: . amLODipine  5 mg Oral Daily  . benzonatate  100 mg Oral TID  . budesonide (PULMICORT) nebulizer solution  0.5 mg Nebulization BID  . cloNIDine  0.1 mg Transdermal Weekly  . enoxaparin (LOVENOX) injection  40 mg Subcutaneous Q24H  . hydrALAZINE  25 mg Oral Q8H  . insulin aspart  0-20 Units Subcutaneous TID WC  . insulin aspart  0-5 Units Subcutaneous QHS  . ipratropium-albuterol  3 mL Nebulization Q6H  . labetalol  100 mg Oral BID  . mouth rinse  15 mL Mouth Rinse BID  . methylPREDNISolone (SOLU-MEDROL) injection  60 mg Intravenous Q6H  . pantoprazole  40 mg Oral Daily   Continuous Infusions: . sodium chloride 10 mL/hr at 11/14/17 0700     LOS: 6 days    Time spent: over 30 min    Fayrene Helper, MD [facility] Pager [phone]  If 7PM-7AM, please contact night-coverage [email or web address] Password [email or web address] 11/14/2017, 8:11 AM

## 2017-11-15 LAB — CBC
HCT: 44.8 % (ref 36.0–46.0)
Hemoglobin: 14.4 g/dL (ref 12.0–15.0)
MCH: 29.3 pg (ref 26.0–34.0)
MCHC: 32.1 g/dL (ref 30.0–36.0)
MCV: 91.1 fL (ref 78.0–100.0)
Platelets: 193 10*3/uL (ref 150–400)
RBC: 4.92 MIL/uL (ref 3.87–5.11)
RDW: 13.8 % (ref 11.5–15.5)
WBC: 9.9 10*3/uL (ref 4.0–10.5)

## 2017-11-15 LAB — GLUCOSE, CAPILLARY
GLUCOSE-CAPILLARY: 138 mg/dL — AB (ref 65–99)
GLUCOSE-CAPILLARY: 218 mg/dL — AB (ref 65–99)
Glucose-Capillary: 98 mg/dL (ref 65–99)
Glucose-Capillary: 98 mg/dL (ref 65–99)

## 2017-11-15 LAB — BASIC METABOLIC PANEL
Anion gap: 9 (ref 5–15)
BUN: 57 mg/dL — AB (ref 6–20)
CALCIUM: 8.6 mg/dL — AB (ref 8.9–10.3)
CO2: 25 mmol/L (ref 22–32)
CREATININE: 1.47 mg/dL — AB (ref 0.44–1.00)
Chloride: 108 mmol/L (ref 101–111)
GFR calc non Af Amer: 35 mL/min — ABNORMAL LOW (ref >60)
GFR, EST AFRICAN AMERICAN: 41 mL/min — AB (ref >60)
Glucose, Bld: 157 mg/dL — ABNORMAL HIGH (ref 65–99)
Potassium: 4.8 mmol/L (ref 3.5–5.1)
SODIUM: 142 mmol/L (ref 135–145)

## 2017-11-15 MED ORDER — METHYLPREDNISOLONE SODIUM SUCC 125 MG IJ SOLR
60.0000 mg | Freq: Two times a day (BID) | INTRAMUSCULAR | Status: DC
  Administered 2017-11-15 – 2017-11-16 (×2): 60 mg via INTRAVENOUS
  Filled 2017-11-15 (×2): qty 2

## 2017-11-15 MED ORDER — NON FORMULARY: 3.0000 mg | Freq: Every day | Status: DC

## 2017-11-15 MED ORDER — LABETALOL HCL 5 MG/ML IV SOLN
5.0000 mg | INTRAVENOUS | Status: DC | PRN
  Filled 2017-11-15: qty 4

## 2017-11-15 MED ORDER — AMLODIPINE BESYLATE 10 MG PO TABS
10.0000 mg | ORAL_TABLET | Freq: Every day | ORAL | Status: DC
  Administered 2017-11-16 – 2017-11-20 (×5): 10 mg via ORAL
  Filled 2017-11-15 (×5): qty 1

## 2017-11-15 MED ORDER — HYDRALAZINE HCL 20 MG/ML IJ SOLN: 10.0000 mg | INTRAMUSCULAR | Status: DC | PRN

## 2017-11-15 MED ORDER — MELATONIN 3 MG PO TABS
3.0000 mg | ORAL_TABLET | Freq: Every day | ORAL | Status: DC
  Administered 2017-11-15 – 2017-11-19 (×5): 3 mg via ORAL
  Filled 2017-11-15 (×6): qty 1

## 2017-11-15 MED ORDER — AMLODIPINE BESYLATE 5 MG PO TABS
5.0000 mg | ORAL_TABLET | Freq: Once | ORAL | Status: AC
  Administered 2017-11-15: 5 mg via ORAL
  Filled 2017-11-15: qty 1

## 2017-11-15 NOTE — Care Management Important Message (Signed)
Important Message  Patient Details  Name: ANAIJA WISSINK MRN: [id number] Date of Birth: Aug 09, 1946   Medicare Important Message Given:  Yes    Kerin Salen 11/15/2017, 10:41 AMImportant Message  Patient Details  Name: [place] SARINANA MRN: [id number] Date of Birth: 07-12-1946   Medicare Important Message Given:  Yes    Kerin Salen 11/15/2017, 10:33 AM

## 2017-11-15 NOTE — Progress Notes (Signed)
PROGRESS NOTE    Victoria Bird  [id number] DOB: 01/09/1946 DOA: 11/07/2017 PCP: Hoyt Koch, MD   Brief Narrative:  72 year old female with history of COPD, hypertension, type 2 diabetes mellitus, ongoing tobacco abuse who presented to the emergency room with shortness of breath, cough productive of yellow sputum with onset of about 3 days ago, persistent, decided to come to the ED. She states that everybody in her household is sick with upper respiratory symptoms.  Her respiratory status got a little bit worse on 2/8, decompensated on 2/9 overnight and required to be transferred to stepdown and placed on BiPAP.  Assessment & Plan:   Principal Problem:   COPD with acute exacerbation (Victoria Bird) Active Problems:   Hypertension   Diabetes mellitus type 2, controlled (Victoria Bird)   CKD (chronic kidney disease), stage III ([name])   Tobacco abuse   COPD exacerbation with acute on chronic hypoxic and hypercarbic respiratory failure, acute respiratory acidosis No O2 at baseline.   3 L O2 by [place] today Wean as tolerated, goal O2 88-92% -Patient with increased cough on 2/8, 2/8-2/9 overnight she had worsening respiratory status, ABG showed acute respiratory acidosis in the setting of acute hypoxic and hypercarbic respiratory failure requiring stepdown transfer and BiPAP -She has been off BiPAP, pulmonary was consulted (appreciate recs) - s/p 5 days azithromycin  -Seems to be improving - taper steroids (BID today) -Continue nebulizers, Brovana.  Allergic to Symbicort  Type 2 diabetes mellitus - regular diet at pt request today -Patient refusing insulin initially as she was told that if she takes insulin she could no longer donate blood, she is a lifelong donor -Continue SSI, continue to monitor with steroids and regular diet  Hypertension -Persistently hypertensive - increase amlodipine to 10 mg.  Clonidine patch.  Hydral q8 25 mg.  Labetalol 100 mg BID.  Chronic kidney disease  stage III - Baseline 1.3-1.6 on admission, peaked to 1.92 here - continue to monitor, fluctuating   Tobacco abuse -Unfortunately continues to smoke, counseled to quit by previous provider  Insomnia: will try to get melatonin (nonformulary) per pt request  DVT prophylaxis: heparin  Code Status: full  Family Communication: none at bedside, declines me calling Disposition Plan: pending   Consultants:   PCCM  Procedures:   none  Antimicrobials:  Anti-infectives (From admission, onward)   Start     Dose/Rate Route Frequency Ordered Stop   11/09/17 1000  azithromycin (ZITHROMAX) tablet 250 mg     250 mg Oral Daily 11/08/17 0529 11/12/17 0955   11/08/17 0600  azithromycin (ZITHROMAX) tablet 500 mg     500 mg Oral  Once 11/08/17 0529 11/08/17 0612         Subjective: Rough day yesterday with transfer. Doing ok today. Still with SOB. No CP. Asking for something for sleep.   Objective: Vitals:   11/14/17 2316 11/15/17 0555 11/15/17 0802 11/15/17 1020  BP: (!) 161/70 (!) 146/58  (!) 183/65  Pulse: 64 68  71  Resp: 20 18    Temp: (!) 97.4 F (36.3 C) 97.8 F (36.6 C)    TempSrc: Oral Oral    SpO2: 96% 94% 91%   Weight:      Height:        Intake/Output Summary (Last 24 hours) at 11/15/2017 1157 Last data filed at 11/15/2017 1043 Gross per 24 hour  Intake 720 ml  Output 1450 ml  Net -730 ml   Filed Weights   11/08/17 1330 11/14/17 1901  Weight:  71.5 kg (157 lb 11.2 oz) 72.7 kg (160 lb 4.4 oz)    Examination:  General: No acute distress. Cardiovascular: Heart sounds show a regular rate, and rhythm. No gallops or rubs. No murmurs. No JVD. Lungs: Decreased breath sounds.  No increased wob.  Scattered faint wheezing.  Abdomen: Soft, nontender, nondistended with normal active bowel sounds. No masses. No hepatosplenomegaly. Neurological: Alert and oriented 3. Moves all extremities 4 with equal strength. Cranial nerves II through XII grossly intact. Skin: Warm  and dry. No rashes or lesions. Extremities: No clubbing or cyanosis. No edema.  Psychiatric: Mood and affect are normal. Insight and judgment are appropriate.     Data Reviewed: I have personally reviewed following labs and imaging studies  CBC: Recent Labs  Lab 11/11/17 0312 11/12/17 0313 11/13/17 0324 11/14/17 0300 11/15/17 0555  WBC 8.1 8.4 8.1 8.8 9.9  HGB 13.3 13.7 13.9 13.9 14.4  HCT 41.6 42.3 42.5 42.3 44.8  MCV 91.8 90.6 90.8 90.4 91.1  PLT 172 181 203 193 480   Basic Metabolic Panel: Recent Labs  Lab 11/11/17 0312 11/12/17 0313 11/13/17 0324 11/14/17 0300 11/15/17 0555  NA 141 136 141 138 142  K 4.1 3.9 4.3 4.4 4.8  CL 109 106 109 107 108  CO2 27 21* 25 23 25   GLUCOSE 113* 150* 165* 192* 157*  BUN 48* 52* 50* 54* 57*  CREATININE 1.55* 1.47* 1.30* 1.41* 1.47*  CALCIUM 8.2* 8.4* 8.7* 8.5* 8.6*   GFR: Estimated Creatinine Clearance: 34 mL/min (A) (by C-G formula based on SCr of 1.47 mg/dL (H)). Liver Function Tests: No results for input(s): AST, ALT, ALKPHOS, BILITOT, PROT, ALBUMIN in the last 168 hours. No results for input(s): LIPASE, AMYLASE in the last 168 hours. No results for input(s): AMMONIA in the last 168 hours. Coagulation Profile: No results for input(s): INR, PROTIME in the last 168 hours. Cardiac Enzymes: No results for input(s): CKTOTAL, CKMB, CKMBINDEX, TROPONINI in the last 168 hours. BNP (last 3 results) No results for input(s): PROBNP in the last 8760 hours. HbA1C: No results for input(s): HGBA1C in the last 72 hours. CBG: Recent Labs  Lab 11/14/17 0808 11/14/17 1121 11/14/17 2315 11/15/17 0753 11/15/17 1141  GLUCAP 143* 232* 121* 138* 218*   Lipid Profile: No results for input(s): CHOL, HDL, LDLCALC, TRIG, CHOLHDL, LDLDIRECT in the last 72 hours. Thyroid Function Tests: No results for input(s): TSH, T4TOTAL, FREET4, T3FREE, THYROIDAB in the last 72 hours. Anemia Panel: No results for input(s): VITAMINB12, FOLATE, FERRITIN,  TIBC, IRON, RETICCTPCT in the last 72 hours. Sepsis Labs: No results for input(s): PROCALCITON, LATICACIDVEN in the last 168 hours.  Recent Results (from the past 240 hour(s))  MRSA PCR Screening     Status: None   Collection Time: 11/10/17  3:40 AM  Result Value Ref Range Status   MRSA by PCR NEGATIVE NEGATIVE Final    Comment:        The GeneXpert MRSA Assay (FDA approved for NASAL specimens only), is one component of a comprehensive MRSA colonization surveillance program. It is not intended to diagnose MRSA infection nor to guide or monitor treatment for MRSA infections. Performed at [facility], [place] [address], [place], [place] [address]          Radiology Studies: No results found.      Scheduled Meds: . [START ON 11/16/2017] amLODipine  10 mg Oral Daily  . amLODipine  5 mg Oral Once  . benzonatate  100 mg Oral TID  .  budesonide (PULMICORT) nebulizer solution  0.5 mg Nebulization BID  . cloNIDine  0.1 mg Transdermal Weekly  . enoxaparin (LOVENOX) injection  40 mg Subcutaneous Q24H  . guaiFENesin  1,200 mg Oral BID  . hydrALAZINE  25 mg Oral Q8H  . insulin aspart  0-20 Units Subcutaneous TID WC  . insulin aspart  0-5 Units Subcutaneous QHS  . ipratropium-albuterol  3 mL Nebulization TID  . labetalol  100 mg Oral BID  . mouth rinse  15 mL Mouth Rinse BID  . methylPREDNISolone (SOLU-MEDROL) injection  60 mg Intravenous Q12H  . NON FORMULARY 3 mg  3 mg Oral QHS  . pantoprazole  40 mg Oral Daily   Continuous Infusions: . sodium chloride 10 mL/hr at 11/14/17 0700     LOS: 7 days    Time spent: over 30 min    Fayrene Helper, MD [facility] Pager [phone]  If 7PM-7AM, please contact night-coverage [email or web address] Password [facility] 11/15/2017, 11:57 AM

## 2017-11-15 NOTE — Progress Notes (Signed)
Report received from ongoing nurse. Agreed with nurse assessment of patient and will cont to monitor. 

## 2017-11-16 LAB — BASIC METABOLIC PANEL
ANION GAP: 7 (ref 5–15)
BUN: 57 mg/dL — ABNORMAL HIGH (ref 6–20)
CO2: 24 mmol/L (ref 22–32)
CREATININE: 1.43 mg/dL — AB (ref 0.44–1.00)
Calcium: 8.4 mg/dL — ABNORMAL LOW (ref 8.9–10.3)
Chloride: 108 mmol/L (ref 101–111)
GFR calc non Af Amer: 36 mL/min — ABNORMAL LOW (ref >60)
GFR, EST AFRICAN AMERICAN: 42 mL/min — AB (ref >60)
Glucose, Bld: 137 mg/dL — ABNORMAL HIGH (ref 65–99)
POTASSIUM: 4.5 mmol/L (ref 3.5–5.1)
SODIUM: 139 mmol/L (ref 135–145)

## 2017-11-16 LAB — GLUCOSE, CAPILLARY
GLUCOSE-CAPILLARY: 197 mg/dL — AB (ref 65–99)
Glucose-Capillary: 152 mg/dL — ABNORMAL HIGH (ref 65–99)
Glucose-Capillary: 118 mg/dL — ABNORMAL HIGH (ref 65–99)
Glucose-Capillary: 185 mg/dL — ABNORMAL HIGH (ref 65–99)
Glucose-Capillary: 146 mg/dL — ABNORMAL HIGH (ref 65–99)

## 2017-11-16 LAB — CBC
HCT: 41.2 % (ref 36.0–46.0)
Hemoglobin: 13.5 g/dL (ref 12.0–15.0)
MCH: 29.5 pg (ref 26.0–34.0)
MCHC: 32.8 g/dL (ref 30.0–36.0)
MCV: 90.2 fL (ref 78.0–100.0)
Platelets: 202 10*3/uL (ref 150–400)
RBC: 4.57 MIL/uL (ref 3.87–5.11)
RDW: 13.7 % (ref 11.5–15.5)
WBC: 10.7 10*3/uL — AB (ref 4.0–10.5)

## 2017-11-16 MED ORDER — METHYLPREDNISOLONE 4 MG PO TBPK
8.0000 mg | ORAL_TABLET | Freq: Every evening | ORAL | Status: AC
  Administered 2017-11-18: 8 mg via ORAL

## 2017-11-16 MED ORDER — METHYLPREDNISOLONE 4 MG PO TBPK
4.0000 mg | ORAL_TABLET | ORAL | Status: AC
  Administered 2017-11-17: 4 mg via ORAL

## 2017-11-16 MED ORDER — METHYLPREDNISOLONE 4 MG PO TBPK
4.0000 mg | ORAL_TABLET | Freq: Three times a day (TID) | ORAL | Status: AC
Start: 2017-11-18 — End: 2017-11-18
  Administered 2017-11-18 (×3): 4 mg via ORAL

## 2017-11-16 MED ORDER — METHYLPREDNISOLONE 4 MG PO TBPK
8.0000 mg | ORAL_TABLET | Freq: Every morning | ORAL | Status: AC
  Administered 2017-11-17: 8 mg via ORAL
  Filled 2017-11-16: qty 21

## 2017-11-16 MED ORDER — METHYLPREDNISOLONE 4 MG PO TBPK
4.0000 mg | ORAL_TABLET | Freq: Four times a day (QID) | ORAL | Status: DC
  Administered 2017-11-19 – 2017-11-20 (×6): 4 mg via ORAL

## 2017-11-16 MED ORDER — METHYLPREDNISOLONE 4 MG PO TBPK
8.0000 mg | ORAL_TABLET | Freq: Every evening | ORAL | Status: AC
  Administered 2017-11-17: 8 mg via ORAL

## 2017-11-16 NOTE — Progress Notes (Signed)
Call placed to dietary regarding patients request for chocolate milk being denied. Per dietary chart showing allergy to chocolate and caffeine. This was discussed with pt and she denied having allergy to chocolate and or caffeine.

## 2017-11-16 NOTE — Progress Notes (Signed)
PROGRESS NOTE    Victoria Bird  [id number] DOB: May 29, 1946 DOA: 11/07/2017 PCP: Hoyt Koch, MD   Brief Narrative:  72 year old female with history of COPD, hypertension, type 2 diabetes mellitus, ongoing tobacco abuse who presented to the emergency room with shortness of breath, cough productive of yellow sputum with onset of about 3 days ago, persistent, decided to come to the ED. She states that everybody in her household is sick with upper respiratory symptoms.  Her respiratory status got Victoria Bird little bit worse on 2/8, decompensated on 2/9 overnight and required to be transferred to stepdown and placed on BiPAP.  Assessment & Plan:   Principal Problem:   COPD with acute exacerbation ([place]) Active Problems:   Hypertension   Diabetes mellitus type 2, controlled ([place])   CKD (chronic kidney disease), stage III ([name])   Tobacco abuse   COPD exacerbation with acute on chronic hypoxic and hypercarbic respiratory failure, acute respiratory acidosis No O2 at baseline.   3 L O2 by [place] today Wean as tolerated, goal O2 88-92% -Patient with increased cough on 2/8, 2/8-2/9 overnight she had worsening respiratory status, ABG showed acute respiratory acidosis in the setting of acute hypoxic and hypercarbic respiratory failure requiring stepdown transfer and BiPAP -She has been off BiPAP, pulmonary was consulted (appreciate recs) - s/p 5 days azithromycin  -Seems to be improving - taper steroids (daily today), transition to PO tomorrow -Continue nebulizers.  Allergic to Symbicort  Type 2 diabetes mellitus - regular diet at pt request today -Patient refusing insulin initially as she was told that if she takes insulin she could no longer donate blood, she is Victoria Bird lifelong donor -Continue SSI, continue to monitor with steroids and regular diet.  CBG's appropriate this AM.  Hypertension -Persistently hypertensive, will continue to monitor with recent increase of amlodipine.  -  increase amlodipine to 10 mg.  Clonidine patch.  Hydral q8 25 mg.  Labetalol 100 mg BID.  Chronic kidney disease stage III - Baseline 1.3-1.6 on admission, peaked to 1.92 here - continue to monitor, fluctuating, stable  Tobacco abuse -Unfortunately continues to smoke, counseled to quit by previous provider  Insomnia: will try to get melatonin (nonformulary) per pt request  DVT prophylaxis: heparin  Code Status: full  Family Communication: none at bedside, declines me calling Disposition Plan: pending   Consultants:   PCCM  Procedures:   none  Antimicrobials:  Anti-infectives (From admission, onward)   Start     Dose/Rate Route Frequency Ordered Stop   11/09/17 1000  azithromycin (ZITHROMAX) tablet 250 mg     250 mg Oral Daily 11/08/17 0529 11/12/17 0955   11/08/17 0600  azithromycin (ZITHROMAX) tablet 500 mg     500 mg Oral  Once 11/08/17 0529 11/08/17 0612         Subjective: Feels up and down.  Frustrated.   Objective: Vitals:   11/15/17 2219 11/16/17 0534 11/16/17 0602 11/16/17 0935  BP: (!) 161/63 (!) 169/71    Pulse: 82 (!) 58 61   Resp: (!) 21 20    Temp: 97.6 F (36.4 C) (!) 97.4 F (36.3 C)    TempSrc: Oral Oral    SpO2: 91% 92%  95%  Weight:      Height:        Intake/Output Summary (Last 24 hours) at 11/16/2017 1001 Last data filed at 11/15/2017 1900 Gross per 24 hour  Intake 480 ml  Output 500 ml  Net -20 ml   [employer]  11/08/17 1330 11/14/17 1901  Weight: 71.5 kg (157 lb 11.2 oz) 72.7 kg (160 lb 4.4 oz)    Examination:  General: No acute distress. Cardiovascular: Heart sounds show Victoria Bird regular rate, and rhythm. No gallops or rubs. No murmurs. No JVD. Lungs: Decreased breath sounds, scattered wheezing.  Increased WOB after activity.  Abdomen: Soft, nontender, nondistended with normal active bowel sounds. No masses. No hepatosplenomegaly. Neurological: Alert and oriented 3. Moves all extremities 4. Cranial nerves II through XII  grossly intact. Skin: Warm and dry. No rashes or lesions. Extremities: No clubbing or cyanosis. No edema.  Psychiatric: Mood and affect are normal. Insight and judgment are appropriate.      Data Reviewed: I have personally reviewed following labs and imaging studies  CBC: Recent Labs  Lab 11/12/17 0313 11/13/17 0324 11/14/17 0300 11/15/17 0555 11/16/17 0458  WBC 8.4 8.1 8.8 9.9 10.7*  HGB 13.7 13.9 13.9 14.4 13.5  HCT 42.3 42.5 42.3 44.8 41.2  MCV 90.6 90.8 90.4 91.1 90.2  PLT 181 203 193 193 387   Basic Metabolic Panel: Recent Labs  Lab 11/12/17 0313 11/13/17 0324 11/14/17 0300 11/15/17 0555 11/16/17 0458  NA 136 141 138 142 139  K 3.9 4.3 4.4 4.8 4.5  CL 106 109 107 108 108  CO2 21* 25 23 25 24   GLUCOSE 150* 165* 192* 157* 137*  BUN 52* 50* 54* 57* 57*  CREATININE 1.47* 1.30* 1.41* 1.47* 1.43*  CALCIUM 8.4* 8.7* 8.5* 8.6* 8.4*   GFR: Estimated Creatinine Clearance: 35 mL/min (Victoria Bird) (by C-G formula based on SCr of 1.43 mg/dL (H)). Liver Function Tests: No results for input(s): AST, ALT, ALKPHOS, BILITOT, PROT, ALBUMIN in the last 168 hours. No results for input(s): LIPASE, AMYLASE in the last 168 hours. No results for input(s): AMMONIA in the last 168 hours. Coagulation Profile: No results for input(s): INR, PROTIME in the last 168 hours. Cardiac Enzymes: No results for input(s): CKTOTAL, CKMB, CKMBINDEX, TROPONINI in the last 168 hours. BNP (last 3 results) No results for input(s): PROBNP in the last 8760 hours. HbA1C: No results for input(s): HGBA1C in the last 72 hours. CBG: Recent Labs  Lab 11/15/17 0753 11/15/17 1141 11/15/17 1643 11/15/17 2217 11/16/17 0734  GLUCAP 138* 218* 98 152* 118*   Lipid Profile: No results for input(s): CHOL, HDL, LDLCALC, TRIG, CHOLHDL, LDLDIRECT in the last 72 hours. Thyroid Function Tests: No results for input(s): TSH, T4TOTAL, FREET4, T3FREE, THYROIDAB in the last 72 hours. Anemia Panel: No results for input(s):  VITAMINB12, FOLATE, FERRITIN, TIBC, IRON, RETICCTPCT in the last 72 hours. Sepsis Labs: No results for input(s): PROCALCITON, LATICACIDVEN in the last 168 hours.  Recent Results (from the past 240 hour(s))  MRSA PCR Screening     Status: None   Collection Time: 11/10/17  3:40 AM  Result Value Ref Range Status   MRSA by PCR NEGATIVE NEGATIVE Final    Comment:        The GeneXpert MRSA Assay (FDA approved for NASAL specimens only), is one component of Shamond Skelton comprehensive MRSA colonization surveillance program. It is not intended to diagnose MRSA infection nor to guide or monitor treatment for MRSA infections. Performed at [name], [place] [address] [place], [place] [address]          Radiology Studies: No results found.      Scheduled Meds: . amLODipine  10 mg Oral Daily  . benzonatate  100 mg Oral TID  . budesonide (PULMICORT) nebulizer solution  0.5  mg Nebulization BID  . cloNIDine  0.1 mg Transdermal Weekly  . enoxaparin (LOVENOX) injection  40 mg Subcutaneous Q24H  . guaiFENesin  1,200 mg Oral BID  . hydrALAZINE  25 mg Oral Q8H  . insulin aspart  0-20 Units Subcutaneous TID WC  . insulin aspart  0-5 Units Subcutaneous QHS  . ipratropium-albuterol  3 mL Nebulization TID  . labetalol  100 mg Oral BID  . mouth rinse  15 mL Mouth Rinse BID  . Melatonin  3 mg Oral QHS  . pantoprazole  40 mg Oral Daily   Continuous Infusions: . sodium chloride 10 mL/hr at 11/14/17 0700     LOS: 8 days    Time spent: over 30 min    Victoria Helper, MD [facility] Pager [phone]  If 7PM-7AM, please contact night-coverage [email or web address] Password [email or web address] 11/16/2017, 10:01 AM

## 2017-11-17 LAB — CBC
HEMATOCRIT: 41.3 % (ref 36.0–46.0)
HEMOGLOBIN: 13.3 g/dL (ref 12.0–15.0)
MCH: 29 pg (ref 26.0–34.0)
MCHC: 32.2 g/dL (ref 30.0–36.0)
MCV: 90.2 fL (ref 78.0–100.0)
Platelets: 187 10*3/uL (ref 150–400)
RBC: 4.58 MIL/uL (ref 3.87–5.11)
RDW: 13.6 % (ref 11.5–15.5)
WBC: 10.6 10*3/uL — ABNORMAL HIGH (ref 4.0–10.5)

## 2017-11-17 LAB — BASIC METABOLIC PANEL
Anion gap: 8 (ref 5–15)
BUN: 55 mg/dL — AB (ref 6–20)
CHLORIDE: 107 mmol/L (ref 101–111)
CO2: 25 mmol/L (ref 22–32)
Calcium: 8.4 mg/dL — ABNORMAL LOW (ref 8.9–10.3)
Creatinine, Ser: 1.39 mg/dL — ABNORMAL HIGH (ref 0.44–1.00)
GFR calc Af Amer: 43 mL/min — ABNORMAL LOW (ref >60)
GFR calc non Af Amer: 37 mL/min — ABNORMAL LOW (ref >60)
GLUCOSE: 123 mg/dL — AB (ref 65–99)
POTASSIUM: 4.4 mmol/L (ref 3.5–5.1)
Sodium: 140 mmol/L (ref 135–145)

## 2017-11-17 LAB — GLUCOSE, CAPILLARY
GLUCOSE-CAPILLARY: 147 mg/dL — AB (ref 65–99)
GLUCOSE-CAPILLARY: 128 mg/dL — AB (ref 65–99)
GLUCOSE-CAPILLARY: 202 mg/dL — AB (ref 65–99)
Glucose-Capillary: 124 mg/dL — ABNORMAL HIGH (ref 65–99)

## 2017-11-17 LAB — MAGNESIUM: Magnesium: 2 mg/dL (ref 1.7–2.4)

## 2017-11-17 MED ORDER — IPRATROPIUM-ALBUTEROL 0.5-2.5 (3) MG/3ML IN SOLN
3.0000 mL | Freq: Three times a day (TID) | RESPIRATORY_TRACT | Status: DC
  Administered 2017-11-17 – 2017-11-20 (×7): 3 mL via RESPIRATORY_TRACT
  Filled 2017-11-17 (×8): qty 3

## 2017-11-17 MED ORDER — IPRATROPIUM-ALBUTEROL 0.5-2.5 (3) MG/3ML IN SOLN
3.0000 mL | Freq: Four times a day (QID) | RESPIRATORY_TRACT | Status: DC
  Administered 2017-11-17: 3 mL via RESPIRATORY_TRACT
  Filled 2017-11-17 (×2): qty 3

## 2017-11-17 NOTE — Evaluation (Signed)
Physical Therapy Evaluation Patient Details Name: Victoria Bird MRN: [id number] DOB: Feb 01, 1946 Today's Date: 11/17/2017   History of Present Illness  72 year old female with history of COPD, hypertension, type 2 diabetes mellitus, ongoing tobacco abuse who presented to the emergency room with shortness of breath, cough productive of yellow sputum  with onset of about 3 days ago, persistent, decided to come to the ED 11/07/17.  She states that everybody in her household is sick with upper respiratory symptoms.   decompensated on 2/9 overnight and required to be transferred to stepdown and placed on BiPAP  Clinical Impression  The patient is very weak and deconditioned. Oxygen saturation dropped to 88% on 2 liters [place]. The patient may benefit from post acute rehab, although patient desires to go home. No family present to discuss. Pt admitted with above diagnosis. Pt currently with functional limitations due to the deficits listed below (see PT Problem List). Pt will benefit from skilled PT to increase their independence and safety with mobility to allow discharge to the venue listed below.       Follow Up Recommendations SNF    Equipment Recommendations  None recommended by PT    Recommendations for Other Services       Precautions / Restrictions Precautions Precautions: Fall Precaution Comments: [place]ito sats, BP      Mobility  Bed Mobility Overal bed mobility: Needs Assistance Bed Mobility: Supine to Sit;Sit to Supine     Supine to sit: Min assist Sit to supine: Min assist;HOB elevated   General bed mobility comments: assist with trunk and legs onto bed.  Transfers Overall transfer level: Needs assistance Equipment used: Rolling walker (2 wheeled) Transfers: Sit to/from Stand Sit to Stand: Mod assist         General transfer comment: assist to rise from the bed, 2 tries to stand. side stepped x 3. Very weak , noted to increase RR and dyspnea. Sats 88% on 2 liters after  return to bed. BP 161/57. Complained of being dizzy while sitting.  Ambulation/Gait                Stairs            Wheelchair Mobility    Modified Rankin (Stroke Patients Only)       Balance Overall balance assessment: Needs assistance Sitting-balance support: Feet supported Sitting balance-Leahy Scale: Fair     Standing balance support: During functional activity;Bilateral upper extremity supported Standing balance-Leahy Scale: Poor Standing balance comment: knees buckle slightly                             Pertinent Vitals/Pain Pain Assessment: No/denies pain    Home Living Family/patient expects to be discharged to:: Private residence Living Arrangements: Children;Other relatives Available Help at Discharge: Available PRN/intermittently Type of Home: Mobile home Home Access: Stairs to enter Entrance Stairs-Rails: Right   Home Layout: One level Home Equipment: Cane - single point;Walker - 4 wheels Additional Comments: uses cane mostly because walker is too heavy for her to carry up and down entry steps    Prior Function Level of Independence: Independent with assistive device(s)         Comments: uses cane in home. sometimes uses walker.  drives     Hand Dominance        Extremity/Trunk Assessment   Upper Extremity Assessment Upper Extremity Assessment: Generalized weakness    Lower Extremity Assessment Lower Extremity Assessment: Generalized weakness  Cervical / Trunk Assessment Cervical / Trunk Assessment: Normal  Communication   Communication: No difficulties  Cognition Arousal/Alertness: Awake/alert Behavior During Therapy: WFL for tasks assessed/performed Overall Cognitive Status: Within Functional Limits for tasks assessed                                        General Comments      Exercises     Assessment/Plan    PT Assessment Patient needs continued PT services  PT Problem List  Decreased strength;Decreased activity tolerance;Decreased knowledge of use of DME;Decreased balance;Decreased safety awareness;Decreased mobility;Decreased knowledge of precautions;Cardiopulmonary status limiting activity       PT Treatment Interventions DME instruction;Gait training;Functional mobility training;Therapeutic activities;Therapeutic exercise;Patient/family education    PT Goals (Current goals can be found in the Care Plan section)  Acute Rehab PT Goals Patient Stated Goal: pt prefers to go home, drive PT Goal Formulation: With patient Time For Goal Achievement: 12/01/17 Potential to Achieve Goals: Good    Frequency Min 2X/week   Barriers to discharge        Co-evaluation               AM-PAC PT "6 Clicks" Daily Activity  Outcome Measure Difficulty turning over in bed (including adjusting bedclothes, sheets and blankets)?: Unable Difficulty moving from lying on back to sitting on the side of the bed? : Unable Difficulty sitting down on and standing up from a chair with arms (e.g., wheelchair, bedside commode, etc,.)?: Unable Help needed moving to and from a bed to chair (including a wheelchair)?: Total Help needed walking in hospital room?: Total Help needed climbing 3-5 steps with a railing? : Total 6 Click Score: 6    End of Session   Activity Tolerance: Patient limited by fatigue;Treatment limited secondary to medical complications (Comment) Patient left: in bed;with call bell/phone within reach;with bed alarm set Nurse Communication: Mobility status PT Visit Diagnosis: Unsteadiness on feet (R26.81);Muscle weakness (generalized) (M62.81)    Time: 1696-7893 PT Time Calculation (min) (ACUTE ONLY): 12 min   Charges:   PT Evaluation $PT Eval Low Complexity: 1 Low     PT G CodesTresa Endo PT [phone]  Claretha Cooper 11/17/2017, 5:13 PM

## 2017-11-17 NOTE — Progress Notes (Signed)
PROGRESS NOTE    Victoria Bird  DPO:[id number] DOB: Mar 09, 1946 DOA: 11/07/2017 PCP: Hoyt Koch, MD   Brief Narrative:  72 year old female with history of COPD, hypertension, type 2 diabetes mellitus, ongoing tobacco abuse who presented to the emergency room with shortness of breath, cough productive of yellow sputum with onset of about 3 days ago, persistent, decided to come to the ED. She states that everybody in her household is sick with upper respiratory symptoms.  Her respiratory status got Victoria Bird little bit worse on 2/8, decompensated on 2/9 overnight and required to be transferred to stepdown and placed on BiPAP.  Assessment & Plan:   Principal Problem:   COPD with acute exacerbation ([place]) Active Problems:   Hypertension   Diabetes mellitus type 2, controlled ([place])   CKD (chronic kidney disease), stage III ([name])   Tobacco abuse   COPD exacerbation with acute on chronic hypoxic and hypercarbic respiratory failure, acute respiratory acidosis No O2 at baseline.   3 L O2 by [place] today Wean as tolerated, goal O2 88-92% Wheezing Matsuko Kretz bit more today, 2/16.  Will ask for prn neb, increase scheduled duonebs, continue PO steroids for now, but may need to change back to IV if not doing well with this.  Encouraged OOB to chair and working with PT.  -Patient with increased cough on 2/8, 2/8-2/9 overnight she had worsening respiratory status, ABG showed acute respiratory acidosis in the setting of acute hypoxic and hypercarbic respiratory failure requiring stepdown transfer and BiPAP -She has been off BiPAP, pulmonary was consulted (appreciate recs) - s/p 5 days azithromycin  - taper steroids (daily today), transition to PO 2/16 (medrol taper with pharm assistance) -Continue nebulizers.  Allergic to Symbicort  Type 2 diabetes mellitus - regular diet at pt request today -Patient refusing insulin initially as she was told that if she takes insulin she could no longer donate blood, she  is Victoria Bird lifelong donor -Continue SSI, continue to monitor with steroids and regular diet.  CBG's appropriate this AM.  Hypertension - improved today, still mod hypertensive, but better.  - increase amlodipine to 10 mg.  Clonidine patch.  Hydral q8 25 mg.  Labetalol 100 mg BID.  Chronic kidney disease stage III - Baseline 1.3-1.6 on admission, peaked to 1.92 here - continue to monitor, fluctuating, stable  Tobacco abuse -Unfortunately continues to smoke, counseled to quit by previous provider  Insomnia: will try to get melatonin (nonformulary) per pt request  DVT prophylaxis: heparin  Code Status: full  Family Communication: none at bedside Disposition Plan: pending   Consultants:   PCCM  Procedures:   none  Antimicrobials:  Anti-infectives (From admission, onward)   Start     Dose/Rate Route Frequency Ordered Stop   11/09/17 1000  azithromycin (ZITHROMAX) tablet 250 mg     250 mg Oral Daily 11/08/17 0529 11/12/17 0955   11/08/17 0600  azithromycin (ZITHROMAX) tablet 500 mg     500 mg Oral  Once 11/08/17 0529 11/08/17 0612         Subjective: Feels wiped out. Feels Victoria Bird bit better from resp standpoint.  Objective: Vitals:   11/16/17 2212 11/16/17 2219 11/17/17 0635 11/17/17 0800  BP:   (!) 148/60   Pulse:   60   Resp:   20   Temp:   97.6 F (36.4 C)   TempSrc:   Oral   SpO2: 92% 92% 94% 94%  Weight:      Height:  Intake/Output Summary (Last 24 hours) at 11/17/2017 0858 Last data filed at 11/17/2017 1093 Gross per 24 hour  Intake 240 ml  Output 1200 ml  Net -960 ml   Filed Weights   11/08/17 1330 11/14/17 1901  Weight: 71.5 kg (157 lb 11.2 oz) 72.7 kg (160 lb 4.4 oz)    Examination:  General: No acute distress. Cardiovascular: Heart sounds show Daisy Mcneel regular rate, and rhythm. No gallops or rubs. No murmurs. No JVD. Lungs: Diffuse wheezing, coarse cough.   Abdomen: Soft, nontender, nondistended with normal active bowel sounds. No masses. No  hepatosplenomegaly. Neurological: Alert and oriented 3. Moves all extremities 4. Cranial nerves II through XII grossly intact. Skin: Warm and dry. No rashes or lesions. Extremities: No clubbing or cyanosis. No edema.  Psychiatric: Mood and affect are normal. Insight and judgment are appropriate.    Data Reviewed: I have personally reviewed following labs and imaging studies  CBC: Recent Labs  Lab 11/13/17 0324 11/14/17 0300 11/15/17 0555 11/16/17 0458 11/17/17 0452  WBC 8.1 8.8 9.9 10.7* 10.6*  HGB 13.9 13.9 14.4 13.5 13.3  HCT 42.5 42.3 44.8 41.2 41.3  MCV 90.8 90.4 91.1 90.2 90.2  PLT 203 193 193 202 235   Basic Metabolic Panel: Recent Labs  Lab 11/13/17 0324 11/14/17 0300 11/15/17 0555 11/16/17 0458 11/17/17 0452  NA 141 138 142 139 140  K 4.3 4.4 4.8 4.5 4.4  CL 109 107 108 108 107  CO2 25 23 25 24 25   GLUCOSE 165* 192* 157* 137* 123*  BUN 50* 54* 57* 57* 55*  CREATININE 1.30* 1.41* 1.47* 1.43* 1.39*  CALCIUM 8.7* 8.5* 8.6* 8.4* 8.4*  MG  --   --   --   --  2.0   GFR: Estimated Creatinine Clearance: 36 mL/min (Demitris Pokorny) (by C-G formula based on SCr of 1.39 mg/dL (H)). Liver Function Tests: No results for input(s): AST, ALT, ALKPHOS, BILITOT, PROT, ALBUMIN in the last 168 hours. No results for input(s): LIPASE, AMYLASE in the last 168 hours. No results for input(s): AMMONIA in the last 168 hours. Coagulation Profile: No results for input(s): INR, PROTIME in the last 168 hours. Cardiac Enzymes: No results for input(s): CKTOTAL, CKMB, CKMBINDEX, TROPONINI in the last 168 hours. BNP (last 3 results) No results for input(s): PROBNP in the last 8760 hours. HbA1C: No results for input(s): HGBA1C in the last 72 hours. CBG: Recent Labs  Lab 11/16/17 0734 11/16/17 1133 11/16/17 1646 11/16/17 2118 11/17/17 0808  GLUCAP 118* 197* 185* 146* 124*   Lipid Profile: No results for input(s): CHOL, HDL, LDLCALC, TRIG, CHOLHDL, LDLDIRECT in the last 72 hours. Thyroid  Function Tests: No results for input(s): TSH, T4TOTAL, FREET4, T3FREE, THYROIDAB in the last 72 hours. Anemia Panel: No results for input(s): VITAMINB12, FOLATE, FERRITIN, TIBC, IRON, RETICCTPCT in the last 72 hours. Sepsis Labs: No results for input(s): PROCALCITON, LATICACIDVEN in the last 168 hours.  Recent Results (from the past 240 hour(s))  MRSA PCR Screening     Status: None   Collection Time: 11/10/17  3:40 AM  Result Value Ref Range Status   MRSA by PCR NEGATIVE NEGATIVE Final    Comment:        The GeneXpert MRSA Assay (FDA approved for NASAL specimens only), is one component of Deirdra Heumann comprehensive MRSA colonization surveillance program. It is not intended to diagnose MRSA infection nor to guide or monitor treatment for MRSA infections. Performed at [facility], [place] [address], [place], [place] [address]  Radiology Studies: No results found.      Scheduled Meds: . amLODipine  10 mg Oral Daily  . benzonatate  100 mg Oral TID  . budesonide (PULMICORT) nebulizer solution  0.5 mg Nebulization BID  . cloNIDine  0.1 mg Transdermal Weekly  . enoxaparin (LOVENOX) injection  40 mg Subcutaneous Q24H  . guaiFENesin  1,200 mg Oral BID  . hydrALAZINE  25 mg Oral Q8H  . insulin aspart  0-20 Units Subcutaneous TID WC  . insulin aspart  0-5 Units Subcutaneous QHS  . ipratropium-albuterol  3 mL Nebulization Q6H  . labetalol  100 mg Oral BID  . mouth rinse  15 mL Mouth Rinse BID  . Melatonin  3 mg Oral QHS  . methylPREDNISolone  4 mg Oral PC lunch  . methylPREDNISolone  4 mg Oral PC supper  . [START ON 11/18/2017] methylPREDNISolone  4 mg Oral 3 x daily with food  . [START ON 11/19/2017] methylPREDNISolone  4 mg Oral 4X daily taper  . methylPREDNISolone  8 mg Oral AC breakfast  . methylPREDNISolone  8 mg Oral Nightly  . [START ON 11/18/2017] methylPREDNISolone  8 mg Oral Nightly  . pantoprazole  40 mg Oral Daily   Continuous Infusions: .  sodium chloride 10 mL/hr at 11/14/17 0700     LOS: 9 days    Time spent: over 30 min    Fayrene Helper, MD [facility] Pager [phone]  If 7PM-7AM, please contact night-coverage [email or web address] Password [facility] 11/17/2017, 8:58 AM

## 2017-11-18 LAB — BASIC METABOLIC PANEL
Anion gap: 7 (ref 5–15)
BUN: 53 mg/dL — ABNORMAL HIGH (ref 6–20)
CALCIUM: 8.2 mg/dL — AB (ref 8.9–10.3)
CO2: 24 mmol/L (ref 22–32)
Chloride: 104 mmol/L (ref 101–111)
Creatinine, Ser: 1.31 mg/dL — ABNORMAL HIGH (ref 0.44–1.00)
GFR, EST AFRICAN AMERICAN: 47 mL/min — AB (ref >60)
GFR, EST NON AFRICAN AMERICAN: 40 mL/min — AB (ref >60)
Glucose, Bld: 149 mg/dL — ABNORMAL HIGH (ref 65–99)
Potassium: 4.9 mmol/L (ref 3.5–5.1)
SODIUM: 135 mmol/L (ref 135–145)

## 2017-11-18 LAB — CBC
HCT: 41.6 % (ref 36.0–46.0)
Hemoglobin: 13.6 g/dL (ref 12.0–15.0)
MCH: 29.4 pg (ref 26.0–34.0)
MCHC: 32.7 g/dL (ref 30.0–36.0)
MCV: 89.8 fL (ref 78.0–100.0)
Platelets: 179 10*3/uL (ref 150–400)
RBC: 4.63 MIL/uL (ref 3.87–5.11)
RDW: 13.6 % (ref 11.5–15.5)
WBC: 15.4 10*3/uL — AB (ref 4.0–10.5)

## 2017-11-18 LAB — GLUCOSE, CAPILLARY
GLUCOSE-CAPILLARY: 107 mg/dL — AB (ref 65–99)
Glucose-Capillary: 142 mg/dL — ABNORMAL HIGH (ref 65–99)
Glucose-Capillary: 143 mg/dL — ABNORMAL HIGH (ref 65–99)
Glucose-Capillary: 183 mg/dL — ABNORMAL HIGH (ref 65–99)

## 2017-11-18 LAB — MAGNESIUM: MAGNESIUM: 1.9 mg/dL (ref 1.7–2.4)

## 2017-11-18 NOTE — NC FL2 (Signed)
[place] MEDICAID FL2 LEVEL OF CARE SCREENING TOOL     IDENTIFICATION  Patient Name: Victoria Bird Birthdate: August 11, 1946 Sex: female Admission Date (Current Location): 11/07/2017  [facility] and [place] Number:  Herbalist and Address:  [facility],  [place], [place]      Provider Number: [id number]  Attending Physician Name and Address:  Elodia Florence., *  Relative Name and Phone Number:       Current Level of Care: Hospital Recommended Level of Care: [place] Prior Approval Number:    Date Approved/Denied:   PASRR Number: [id number]  Discharge Plan: SNF    Current Diagnoses: Patient Active Problem List   Diagnosis Date Noted  . COPD with acute exacerbation ([place]) 11/08/2017  . Vertigo 03/03/2017  . Fibromyalgia 01/18/2017  . Tobacco abuse 12/17/2016  . CKD (chronic kidney disease), stage III ([place]) 12/15/2016  . AAA (abdominal aortic aneurysm) without rupture ([place]) 06/08/2016  . Right-sided low back pain without sciatica   . Adjustment disorder with mixed anxiety and depressed mood 02/25/2016  . Overweight (BMI 25.0-29.9) 01/25/2016  . Allergic rhinitis 11/17/2015  . Tobacco use disorder 06/22/2015  . Diabetes mellitus type 2, controlled ([place]) 06/16/2015  . H/O recurrent pneumonia 05/27/2015  . GERD (gastroesophageal reflux disease) 05/27/2015  . Hyperlipidemia 05/27/2015  . Chronic pain syndrome 05/27/2015  . Thyroid nodule 01/20/2015  . COPD (chronic obstructive pulmonary disease) ([place]) 10/09/2014  . Hypertension 12/14/2011  . OSA (obstructive sleep apnea) 10/02/2008    Orientation RESPIRATION BLADDER Height & Weight     Self, Time, Situation, Place  O2([place] 3L) External catheter(catheter placed 11/10/17) Weight: 160 lb 4.4 oz (72.7 kg) Height:  5\' 3"  (160 cm)  BEHAVIORAL SYMPTOMS/MOOD NEUROLOGICAL BOWEL NUTRITION STATUS      Continent Diet(regular)  AMBULATORY STATUS COMMUNICATION OF NEEDS  Skin   Extensive Assist Verbally Normal                       Personal Care Assistance Level of Assistance  Bathing, Feeding, Dressing Bathing Assistance: Limited assistance Feeding assistance: Independent Dressing Assistance: Limited assistance     Functional Limitations Info  Sight, Hearing, Speech Sight Info: Adequate Hearing Info: Adequate Speech Info: Adequate    SPECIAL CARE FACTORS FREQUENCY  PT (By licensed PT), OT (By licensed OT)     PT Frequency: 5x/wk OT Frequency: 5x/wk            Contractures Contractures Info: Not present    Additional Factors Info  Code Status, Allergies, Insulin Sliding Scale Code Status Info: Full Allergies Info: Aspartame And Phenylalanine, Doxycycline, Tramadol, Codeine, Neurontin Gabapentin, Sulfa Antibiotics, Symbicort Budesonide-formoterol Fumarate, Penicillins, Prednisone   Insulin Sliding Scale Info: 0-20 units 3x/day with meals, 0-5 units daily at bedtime       Current Medications (11/18/2017):  This is the current hospital active medication list Current Facility-Administered Medications  Medication Dose Route Frequency Provider Last Rate Last Dose  . 0.9 %  sodium chloride infusion   Intravenous Continuous Tarry Kos, MD 10 mL/hr at 11/14/17 0700    . acetaminophen (TYLENOL) tablet 650 mg  650 mg Oral Q6H PRN Etta Quill, DO   650 mg at 11/13/17 4174   Or  . acetaminophen (TYLENOL) suppository 650 mg  650 mg Rectal Q6H PRN Etta Quill, DO      . albuterol (PROVENTIL) (2.5 MG/3ML) 0.083% nebulizer solution 2.5 mg  2.5 mg Nebulization Q1H  PRN Etta Quill, DO   2.5 mg at 11/11/17 2233  . amLODipine (NORVASC) tablet 10 mg  10 mg Oral Daily Elodia Florence., MD   10 mg at 11/18/17 1057  . benzonatate (TESSALON) capsule 100 mg  100 mg Oral TID Rai, Ripudeep K, MD   100 mg at 11/18/17 1057  . budesonide (PULMICORT) nebulizer solution 0.5 mg  0.5 mg Nebulization BID Chesley Mires, MD   0.5 mg at  11/18/17 0909  . cloNIDine (CATAPRES - Dosed in mg/24 hr) patch 0.1 mg  0.1 mg Transdermal Weekly Caren Griffins, MD   0.1 mg at 11/17/17 2123  . enoxaparin (LOVENOX) injection 40 mg  40 mg Subcutaneous Q24H Rai, Ripudeep K, MD   40 mg at 11/18/17 1300  . guaiFENesin (MUCINEX) 12 hr tablet 1,200 mg  1,200 mg Oral BID Ollis, Brandi L, NP   1,200 mg at 11/18/17 1057  . hydrALAZINE (APRESOLINE) injection 10 mg  10 mg Intravenous Q4H PRN Elodia Florence., MD      . hydrALAZINE (APRESOLINE) tablet 25 mg  25 mg Oral Q8H Rai, Ripudeep K, MD   25 mg at 11/18/17 0554  . insulin aspart (novoLOG) injection 0-20 Units  0-20 Units Subcutaneous TID WC Chesley Mires, MD   3 Units at 11/18/17 0835  . insulin aspart (novoLOG) injection 0-5 Units  0-5 Units Subcutaneous QHS Sood, Vineet, MD      . ipratropium-albuterol (DUONEB) 0.5-2.5 (3) MG/3ML nebulizer solution 3 mL  3 mL Nebulization TID Elodia Florence., MD   3 mL at 11/18/17 0907  . labetalol (NORMODYNE) tablet 100 mg  100 mg Oral BID Caren Griffins, MD   100 mg at 11/18/17 1057  . labetalol (NORMODYNE,TRANDATE) injection 5 mg  5 mg Intravenous Q2H PRN Elodia Florence., MD      . MEDLINE mouth rinse  15 mL Mouth Rinse BID Rai, Ripudeep K, MD   15 mL at 11/17/17 2122  . Melatonin TABS 3 mg  3 mg Oral QHS Polly Cobia, RPH   3 mg at 11/17/17 2120  . methylPREDNISolone (MEDROL DOSEPAK) tablet 4 mg  4 mg Oral 3 x daily with food Elodia Florence., MD   4 mg at 11/18/17 1311  . [START ON 11/19/2017] methylPREDNISolone (MEDROL DOSEPAK) tablet 4 mg  4 mg Oral 4X daily taper Elodia Florence., MD      . methylPREDNISolone (MEDROL DOSEPAK) tablet 8 mg  8 mg Oral Nightly Elodia Florence., MD      . ondansetron [facility]) injection 4 mg  4 mg Intravenous Q6H PRN Etta Quill, DO   4 mg at 11/09/17 1748  . pantoprazole (PROTONIX) EC tablet 40 mg  40 mg Oral Daily Chesley Mires, MD   40 mg at 11/18/17 1057     Discharge  Medications: Please see discharge summary for a list of discharge medications.  Relevant Imaging Results:  Relevant Lab Results:   Additional Information SS#: [id number]  Geralynn Ochs, LCSW

## 2017-11-18 NOTE — Progress Notes (Signed)
Attempted to assist patient out of the bed to the chair today x2. Pt has refused. Educations provided regarding the importance of mobilization. Pt is not agreeable at this time.

## 2017-11-18 NOTE — Progress Notes (Signed)
PROGRESS NOTE    Victoria Bird  PYK:[id number] DOB: September 29, 1946 DOA: 11/07/2017 PCP: Hoyt Koch, MD   Brief Narrative:  72 year old female with history of COPD, hypertension, type 2 diabetes mellitus, ongoing tobacco abuse who presented to the emergency room with shortness of breath, cough productive of yellow sputum with onset of about 3 days ago, persistent, decided to come to the ED. She states that everybody in her household is sick with upper respiratory symptoms.  Her respiratory status got Victoria Bird little bit worse on 2/8, decompensated on 2/9 overnight and required to be transferred to stepdown and placed on BiPAP.  Assessment & Plan:   Principal Problem:   COPD with acute exacerbation (Victoria Bird) Active Problems:   Hypertension   Diabetes mellitus type 2, controlled (Victoria Bird)   CKD (chronic kidney disease), stage III (Victoria Bird)   Tobacco abuse   COPD exacerbation with acute on chronic hypoxic and hypercarbic respiratory failure, acute respiratory acidosis No O2 at baseline.   3 L O2 by Victoria Bird today Wean as tolerated, goal O2 88-92% Seems stable today, ctm on steroid taper.   Scheduled duonebs, prn albuterol.  Encouraged OOB to chair and working with PT.  -Patient with increased cough on 2/8, 2/8-2/9 overnight she had worsening respiratory status, ABG showed acute respiratory acidosis in the setting of acute hypoxic and hypercarbic respiratory failure requiring stepdown transfer and BiPAP -She has been off BiPAP, pulmonary was consulted (appreciate recs) - s/p 5 days azithromycin  - taper steroids (daily today), transition to PO 2/16 (medrol taper with pharm assistance) -Continue nebulizers.  Allergic to Symbicort - PT recommending SNF  Type 2 diabetes mellitus - regular diet at pt request today -Patient refusing insulin initially as she was told that if she takes insulin she could no longer donate blood, she is Victoria Bird lifelong donor -Continue SSI, continue to monitor with steroids and  regular diet.  CBG's appropriate this AM.  Hypertension - improved today, still mod hypertensive, but better.  - increase amlodipine to 10 mg.  Clonidine patch.  Hydral q8 25 mg.  Labetalol 100 mg BID.  Chronic kidney disease stage III - Baseline 1.3-1.6 on admission, peaked to 1.92 here - continue to monitor, fluctuating, stable  Leukocytosis:  - likely 2/2 steroids, follow   Tobacco abuse -Unfortunately continues to smoke, counseled to quit by previous provider  Insomnia: will try to get melatonin (nonformulary) per pt request  DVT prophylaxis: heparin  Code Status: full  Family Communication: none at bedside Disposition Plan: pending   Consultants:   PCCM  Procedures:   none  Antimicrobials:  Anti-infectives (From admission, onward)   Start     Dose/Rate Route Frequency Ordered Stop   11/09/17 1000  azithromycin (ZITHROMAX) tablet 250 mg     250 mg Oral Daily 11/08/17 0529 11/12/17 0955   11/08/17 0600  azithromycin (ZITHROMAX) tablet 500 mg     500 mg Oral  Once 11/08/17 0529 11/08/17 0612         Subjective: Tired.  Steadily, slowly better.  Objective: Vitals:   11/18/17 0551 11/18/17 0907 11/18/17 0914 11/18/17 1057  BP: (!) 141/52   (!) 147/58  Pulse: 64   68  Resp: 18     Temp: 98.5 F (36.9 C)     TempSrc: Oral     SpO2: 93% 92% 92%   Weight:      Height:        Intake/Output Summary (Last 24 hours) at 11/18/2017 1515 Last data filed at  11/18/2017 0551 Gross per 24 hour  Intake -  Output 1450 ml  Net -1450 ml   Filed Weights   11/08/17 1330 11/14/17 1901  Weight: 71.5 kg (157 lb 11.2 oz) 72.7 kg (160 lb 4.4 oz)    Examination:  General: No acute distress. Cardiovascular: Heart sounds show Victoria Bird regular rate, and rhythm. No gallops or rubs. No murmurs. No JVD. Lungs: decreased bbreath sounds.  Harsh cough.  Abdomen: Soft, nontender, nondistended with normal active bowel sounds. No masses. No hepatosplenomegaly. Neurological: Alert  and oriented 3. Moves all extremities 4. Cranial nerves II through XII grossly intact. Skin: Warm and dry. No rashes or lesions. Extremities: No clubbing or cyanosis. No edema.  Psychiatric: Mood and affect are normal. Insight and judgment are appropriate.   Data Reviewed: I have personally reviewed following labs and imaging studies  CBC: Recent Labs  Lab 11/14/17 0300 11/15/17 0555 11/16/17 0458 11/17/17 0452 11/18/17 0518  WBC 8.8 9.9 10.7* 10.6* 15.4*  HGB 13.9 14.4 13.5 13.3 13.6  HCT 42.3 44.8 41.2 41.3 41.6  MCV 90.4 91.1 90.2 90.2 89.8  PLT 193 193 202 187 161   Basic Metabolic Panel: Recent Labs  Lab 11/14/17 0300 11/15/17 0555 11/16/17 0458 11/17/17 0452 11/18/17 0518  NA 138 142 139 140 135  K 4.4 4.8 4.5 4.4 4.9  CL 107 108 108 107 104  CO2 23 25 24 25 24   GLUCOSE 192* 157* 137* 123* 149*  BUN 54* 57* 57* 55* 53*  CREATININE 1.41* 1.47* 1.43* 1.39* 1.31*  CALCIUM 8.5* 8.6* 8.4* 8.4* 8.2*  MG  --   --   --  2.0 1.9   GFR: Estimated Creatinine Clearance: 38.2 mL/min (Victoria Bird) (by C-G formula based on SCr of 1.31 mg/dL (H)). Liver Function Tests: No results for input(s): AST, ALT, ALKPHOS, BILITOT, PROT, ALBUMIN in the last 168 hours. No results for input(s): LIPASE, AMYLASE in the last 168 hours. No results for input(s): AMMONIA in the last 168 hours. Coagulation Profile: No results for input(s): INR, PROTIME in the last 168 hours. Cardiac Enzymes: No results for input(s): CKTOTAL, CKMB, CKMBINDEX, TROPONINI in the last 168 hours. BNP (last 3 results) No results for input(s): PROBNP in the last 8760 hours. HbA1C: No results for input(s): HGBA1C in the last 72 hours. CBG: Recent Labs  Lab 11/17/17 1251 11/17/17 1715 11/17/17 2057 11/18/17 0729 11/18/17 1135  GLUCAP 147* 128* 202* 142* 107*   Lipid Profile: No results for input(s): CHOL, HDL, LDLCALC, TRIG, CHOLHDL, LDLDIRECT in the last 72 hours. Thyroid Function Tests: No results for input(s):  TSH, T4TOTAL, FREET4, T3FREE, THYROIDAB in the last 72 hours. Anemia Panel: No results for input(s): VITAMINB12, FOLATE, FERRITIN, TIBC, IRON, RETICCTPCT in the last 72 hours. Sepsis Labs: No results for input(s): PROCALCITON, LATICACIDVEN in the last 168 hours.  Recent Results (from the past 240 hour(s))  MRSA PCR Screening     Status: None   Collection Time: 11/10/17  3:40 AM  Result Value Ref Range Status   MRSA by PCR NEGATIVE NEGATIVE Final    Comment:        The GeneXpert MRSA Assay (FDA approved for NASAL specimens only), is one component of [facility] [place] comprehensive MRSA colonization surveillance program. It is not intended to diagnose MRSA infection nor to guide or monitor treatment for MRSA infections. Performed at [facility], [place] [address] [place], [place] [address]          Radiology Studies: No results found.  Scheduled Meds: . amLODipine  10 mg Oral Daily  . benzonatate  100 mg Oral TID  . budesonide (PULMICORT) nebulizer solution  0.5 mg Nebulization BID  . cloNIDine  0.1 mg Transdermal Weekly  . enoxaparin (LOVENOX) injection  40 mg Subcutaneous Q24H  . guaiFENesin  1,200 mg Oral BID  . hydrALAZINE  25 mg Oral Q8H  . insulin aspart  0-20 Units Subcutaneous TID WC  . insulin aspart  0-5 Units Subcutaneous QHS  . ipratropium-albuterol  3 mL Nebulization TID  . labetalol  100 mg Oral BID  . mouth rinse  15 mL Mouth Rinse BID  . Melatonin  3 mg Oral QHS  . methylPREDNISolone  4 mg Oral 3 x daily with food  . [START ON 11/19/2017] methylPREDNISolone  4 mg Oral 4X daily taper  . methylPREDNISolone  8 mg Oral Nightly  . pantoprazole  40 mg Oral Daily   Continuous Infusions: . sodium chloride 10 mL/hr at 11/14/17 0700     LOS: 10 days    Time spent: over 30 min    Fayrene Helper, MD [facility] Pager [phone]  If 7PM-7AM, please contact night-coverage [email or web address] Password [facility] 11/18/2017, 3:15 PM

## 2017-11-18 NOTE — Clinical Social Work Note (Signed)
Clinical Social Work Assessment  Patient Details  Name: Victoria Bird MRN: [id number] Date of Birth: 03-05-1946  Date of referral:  11/18/17               Reason for consult:  Facility Placement                Permission sought to share information with:  Facility Art therapist granted to share information::  Yes, Verbal Permission Granted  Name::        Agency::  SNF  Relationship::     Contact Information:     Housing/Transportation Living arrangements for the past 2 months:  Single Family Home Source of Information:  Patient Patient Interpreter Needed:  None Criminal Activity/Legal Involvement Pertinent to Current Situation/Hospitalization:  No - Comment as needed Significant Relationships:  Other(Comment), Other Family Members(grandson, foster daughter) Lives with:  Self, Other (Comment), Relatives(foster daughter, grandson) Do you feel safe going back to the place where you live?  Yes Need for family participation in patient care:  No (Coment)  Care giving concerns:  Patient lives at home with grandson and foster daughter who do provide support and assistance for the patient, but she is very weak at this time due to prolonged hospitalization and illness. Patient will benefit from short term rehab at discharge.   Social Worker assessment / plan:  CSW met with patient to discuss recommendation for SNF. CSW explained SNF placement and discussed expectations. CSW obtained permission to fax out referral, and will follow up with bed offers. CSW assured patient that this would not lock her into any decision and she can still think about it and change her mind if she wants to. CSW confirmed that insurance approval would be obtained prior to admission, to ensure that the insurance would pay for SNF.  Employment status:  Retired Nurse, adult PT Recommendations:  [place] / Referral to community resources:   [place]  Patient/Family's Response to care:  Patient unsure if she is agreeable to SNF or not, but was agreeable to think about it; patient doesn't want to have to be away from home any longer, but knows that she would benefit from some additional therapy before going back home.  Patient/Family's Understanding of and Emotional Response to Diagnosis, Current Treatment, and Prognosis:  Patient stated that she is just tired of being away from home and wants to be able to go home as soon as she can. Patient acknowledged that she would benefit from therapy, and might consider it if it was only for a very short period of time. Patient agreed to think it over and consider it. Patient would like to discuss bed offers when available.  Emotional Assessment Appearance:  Appears stated age Attitude/Demeanor/Rapport:  Engaged, Lethargic Affect (typically observed):  Pleasant, Apprehensive Orientation:  Oriented to Self, Oriented to Place, Oriented to  Time, Oriented to Situation Alcohol / Substance use:  Not Applicable Psych involvement (Current and /or in the community):  No (Comment)  Discharge Needs  Concerns to be addressed:  Care Coordination Readmission within the last 30 days:  No Current discharge risk:  Dependent with Mobility Barriers to Discharge:  Continued Medical Work up, [place], [place] 11/18/2017, 4:18 PM

## 2017-11-19 LAB — BASIC METABOLIC PANEL
ANION GAP: 8 (ref 5–15)
BUN: 51 mg/dL — AB (ref 6–20)
CHLORIDE: 105 mmol/L (ref 101–111)
CO2: 24 mmol/L (ref 22–32)
Calcium: 8.6 mg/dL — ABNORMAL LOW (ref 8.9–10.3)
Creatinine, Ser: 1.36 mg/dL — ABNORMAL HIGH (ref 0.44–1.00)
GFR calc Af Amer: 45 mL/min — ABNORMAL LOW (ref >60)
GFR, EST NON AFRICAN AMERICAN: 38 mL/min — AB (ref >60)
GLUCOSE: 142 mg/dL — AB (ref 65–99)
POTASSIUM: 5.1 mmol/L (ref 3.5–5.1)
Sodium: 137 mmol/L (ref 135–145)

## 2017-11-19 LAB — CBC
HEMATOCRIT: 41.2 % (ref 36.0–46.0)
Hemoglobin: 13.6 g/dL (ref 12.0–15.0)
MCH: 29.6 pg (ref 26.0–34.0)
MCHC: 33 g/dL (ref 30.0–36.0)
MCV: 89.8 fL (ref 78.0–100.0)
PLATELETS: 188 10*3/uL (ref 150–400)
RBC: 4.59 MIL/uL (ref 3.87–5.11)
RDW: 13.7 % (ref 11.5–15.5)
WBC: 13.1 10*3/uL — ABNORMAL HIGH (ref 4.0–10.5)

## 2017-11-19 LAB — GLUCOSE, CAPILLARY
GLUCOSE-CAPILLARY: 166 mg/dL — AB (ref 65–99)
GLUCOSE-CAPILLARY: 133 mg/dL — AB (ref 65–99)
Glucose-Capillary: 121 mg/dL — ABNORMAL HIGH (ref 65–99)
Glucose-Capillary: 96 mg/dL (ref 65–99)

## 2017-11-19 MED ORDER — CYCLOBENZAPRINE HCL 5 MG PO TABS
5.0000 mg | ORAL_TABLET | Freq: Three times a day (TID) | ORAL | Status: DC | PRN
  Administered 2017-11-19: 5 mg via ORAL
  Filled 2017-11-19: qty 1

## 2017-11-19 NOTE — Progress Notes (Signed)
PROGRESS NOTE    Victoria Bird  JME:[id number] DOB: [birth year] DOA: 11/07/2017 PCP: Hoyt Koch, MD   Brief Narrative:  72 year old female with history of COPD, hypertension, type 2 diabetes mellitus, ongoing tobacco abuse who presented to the emergency room with shortness of breath, cough productive of yellow sputum with onset of about 3 days ago, persistent, decided to come to the ED. She states that everybody in her household is sick with upper respiratory symptoms.  Her respiratory status got Zoila Ditullio little bit worse on 2/8, decompensated on 2/9 overnight and required to be transferred to stepdown and placed on BiPAP.  Assessment & Plan:   Principal Problem:   COPD with acute exacerbation ([place]) Active Problems:   Hypertension   Diabetes mellitus type 2, controlled ([place])   CKD (chronic kidney disease), stage III ([name])   Tobacco abuse   COPD exacerbation with acute on chronic hypoxic and hypercarbic respiratory failure, acute respiratory acidosis No O2 at baseline.   2 L O2 by [place] today Wean as tolerated, goal O2 88-92% Overall seems stable, down to 2 L [place], but wheezing and decreased air movement Scheduled duonebs, prn albuterol.  Encouraged OOB to chair and working with PT.  -Patient with increased cough on 2/8, 2/8-2/9 overnight she had worsening respiratory status, ABG showed acute respiratory acidosis in the setting of acute hypoxic and hypercarbic respiratory failure requiring stepdown transfer and BiPAP -She has been off BiPAP, pulmonary was consulted (appreciate recs) - s/p 5 days azithromycin  - taper steroids (daily today), transition to PO 2/16 (medrol taper) - if still improving slowly tomorrow, may consider redosing IV steroids -Continue nebulizers.  Allergic to Symbicort - PT recommending SNF  Type 2 diabetes mellitus - regular diet at pt request today -Patient refusing insulin initially as she was told that if she takes insulin she could no longer donate  blood, she is Kamyla Olejnik lifelong donor -Continue SSI, continue to monitor with steroids and regular diet.  CBG's appropriate this AM.  Hypertension - moderate HTN, improved overall - increase amlodipine to 10 mg.  Clonidine patch.  Hydral q8 25 mg.  Labetalol 100 mg BID.  Chronic kidney disease stage III - Baseline 1.3-1.6 on admission, peaked to 1.92 here - continue to monitor, stable  Leukocytosis:  - likely 2/2 steroids, follow   Neck Pain: - flexeril   Tobacco abuse -Unfortunately continues to smoke, counseled to quit by previous provider  Insomnia: will try to get melatonin (nonformulary) per pt request  DVT prophylaxis: heparin  Code Status: full  Family Communication: none at bedside Disposition Plan: pending   Consultants:   PCCM  Procedures:   none  Antimicrobials:  Anti-infectives (From admission, onward)   Start     Dose/Rate Route Frequency Ordered Stop   11/09/17 1000  azithromycin (ZITHROMAX) tablet 250 mg     250 mg Oral Daily 11/08/17 0529 11/12/17 0955   11/08/17 0600  azithromycin (ZITHROMAX) tablet 500 mg     500 mg Oral  Once 11/08/17 0529 11/08/17 0612         Subjective: Some neck pain.   Objective: Vitals:   11/19/17 0805 11/19/17 0814 11/19/17 1147 11/19/17 1409  BP:   (!) 150/62 (!) 148/54  Pulse:   65 68  Resp:    18  Temp:      TempSrc:      SpO2: 93% 93%  92%  Weight:      Height:        Intake/Output Summary (  Last 24 hours) at 11/19/2017 1705 Last data filed at 11/19/2017 1513 Gross per 24 hour  Intake 240 ml  Output 1700 ml  Net -1460 ml   Filed Weights   11/08/17 1330 11/14/17 1901  Weight: 71.5 kg (157 lb 11.2 oz) 72.7 kg (160 lb 4.4 oz)    Examination:  General: No acute distress. Cardiovascular: Heart sounds show Shawnmichael Parenteau regular rate, and rhythm. No gallops or rubs. No murmurs. No JVD. Lungs: Work of breathing slightly increased, scattered diffuse wheezing Abdomen: Soft, nontender, nondistended with normal active  bowel sounds. No masses. No hepatosplenomegaly. Neurological: Alert and oriented 3. Moves all extremities 4. Cranial nerves II through XII grossly intact. Skin: Warm and dry. No rashes or lesions. Extremities: No clubbing or cyanosis. No edema.  Psychiatric: Mood and affect are normal. Insight and judgment are appropriate.    Data Reviewed: I have personally reviewed following labs and imaging studies  CBC: Recent Labs  Lab 11/15/17 0555 11/16/17 0458 11/17/17 0452 11/18/17 0518 11/19/17 0537  WBC 9.9 10.7* 10.6* 15.4* 13.1*  HGB 14.4 13.5 13.3 13.6 13.6  HCT 44.8 41.2 41.3 41.6 41.2  MCV 91.1 90.2 90.2 89.8 89.8  PLT 193 202 187 179 379   Basic Metabolic Panel: Recent Labs  Lab 11/15/17 0555 11/16/17 0458 11/17/17 0452 11/18/17 0518 11/19/17 0537  NA 142 139 140 135 137  K 4.8 4.5 4.4 4.9 5.1  CL 108 108 107 104 105  CO2 25 24 25 24 24   GLUCOSE 157* 137* 123* 149* 142*  BUN 57* 57* 55* 53* 51*  CREATININE 1.47* 1.43* 1.39* 1.31* 1.36*  CALCIUM 8.6* 8.4* 8.4* 8.2* 8.6*  MG  --   --  2.0 1.9  --    GFR: Estimated Creatinine Clearance: 36.8 mL/min (Kechia Yahnke) (by C-G formula based on SCr of 1.36 mg/dL (H)). Liver Function Tests: No results for input(s): AST, ALT, ALKPHOS, BILITOT, PROT, ALBUMIN in the last 168 hours. No results for input(s): LIPASE, AMYLASE in the last 168 hours. No results for input(s): AMMONIA in the last 168 hours. Coagulation Profile: No results for input(s): INR, PROTIME in the last 168 hours. Cardiac Enzymes: No results for input(s): CKTOTAL, CKMB, CKMBINDEX, TROPONINI in the last 168 hours. BNP (last 3 results) No results for input(s): PROBNP in the last 8760 hours. HbA1C: No results for input(s): HGBA1C in the last 72 hours. CBG: Recent Labs  Lab 11/18/17 1638 11/18/17 2117 11/19/17 0743 11/19/17 1142 11/19/17 1654  GLUCAP 143* 183* 121* 96 166*   Lipid Profile: No results for input(s): CHOL, HDL, LDLCALC, TRIG, CHOLHDL, LDLDIRECT in  the last 72 hours. Thyroid Function Tests: No results for input(s): TSH, T4TOTAL, FREET4, T3FREE, THYROIDAB in the last 72 hours. Anemia Panel: No results for input(s): VITAMINB12, FOLATE, FERRITIN, TIBC, IRON, RETICCTPCT in the last 72 hours. Sepsis Labs: No results for input(s): PROCALCITON, LATICACIDVEN in the last 168 hours.  Recent Results (from the past 240 hour(s))  MRSA PCR Screening     Status: None   Collection Time: 11/10/17  3:40 AM  Result Value Ref Range Status   MRSA by PCR NEGATIVE NEGATIVE Final    Comment:        The GeneXpert MRSA Assay (FDA approved for NASAL specimens only), is one component of Aideliz [facility] comprehensive MRSA colonization surveillance program. It is not intended to diagnose MRSA infection nor to guide or monitor treatment for MRSA infections. Performed at [name], [place] [address] [place], [place] [address]  Radiology Studies: No results found.      Scheduled Meds: . amLODipine  10 mg Oral Daily  . benzonatate  100 mg Oral TID  . budesonide (PULMICORT) nebulizer solution  0.5 mg Nebulization BID  . cloNIDine  0.1 mg Transdermal Weekly  . enoxaparin (LOVENOX) injection  40 mg Subcutaneous Q24H  . guaiFENesin  1,200 mg Oral BID  . hydrALAZINE  25 mg Oral Q8H  . insulin aspart  0-20 Units Subcutaneous TID WC  . insulin aspart  0-5 Units Subcutaneous QHS  . ipratropium-albuterol  3 mL Nebulization TID  . labetalol  100 mg Oral BID  . mouth rinse  15 mL Mouth Rinse BID  . Melatonin  3 mg Oral QHS  . methylPREDNISolone  4 mg Oral 4X daily taper  . pantoprazole  40 mg Oral Daily   Continuous Infusions: . sodium chloride 10 mL/hr at 11/14/17 0700     LOS: 11 days    Time spent: over 20 min    Fayrene Helper, MD [facility] Pager [phone]  If 7PM-7AM, please contact night-coverage [email or web address] Password [facility] 11/19/2017, 5:05 PM

## 2017-11-20 LAB — MAGNESIUM: MAGNESIUM: 2 mg/dL (ref 1.7–2.4)

## 2017-11-20 LAB — BASIC METABOLIC PANEL
ANION GAP: 8 (ref 5–15)
BUN: 50 mg/dL — ABNORMAL HIGH (ref 6–20)
CO2: 24 mmol/L (ref 22–32)
Calcium: 8.1 mg/dL — ABNORMAL LOW (ref 8.9–10.3)
Chloride: 107 mmol/L (ref 101–111)
Creatinine, Ser: 1.51 mg/dL — ABNORMAL HIGH (ref 0.44–1.00)
GFR calc non Af Amer: 34 mL/min — ABNORMAL LOW (ref >60)
GFR, EST AFRICAN AMERICAN: 39 mL/min — AB (ref >60)
Glucose, Bld: 160 mg/dL — ABNORMAL HIGH (ref 65–99)
Potassium: 4.8 mmol/L (ref 3.5–5.1)
SODIUM: 139 mmol/L (ref 135–145)

## 2017-11-20 LAB — GLUCOSE, CAPILLARY
GLUCOSE-CAPILLARY: 99 mg/dL (ref 65–99)
GLUCOSE-CAPILLARY: 162 mg/dL — AB (ref 65–99)
GLUCOSE-CAPILLARY: 109 mg/dL — AB (ref 65–99)

## 2017-11-20 LAB — CBC
HEMATOCRIT: 40.4 % (ref 36.0–46.0)
HEMOGLOBIN: 13 g/dL (ref 12.0–15.0)
MCH: 29.2 pg (ref 26.0–34.0)
MCHC: 32.2 g/dL (ref 30.0–36.0)
MCV: 90.8 fL (ref 78.0–100.0)
Platelets: 181 10*3/uL (ref 150–400)
RBC: 4.45 MIL/uL (ref 3.87–5.11)
RDW: 14 % (ref 11.5–15.5)
WBC: 14.4 10*3/uL — AB (ref 4.0–10.5)

## 2017-11-20 MED ORDER — METHYLPREDNISOLONE 4 MG PO TBPK: ORAL_TABLET | ORAL | Status: DC

## 2017-11-20 MED ORDER — CLONIDINE 0.1 MG/24HR TD PTWK: 0.1000 mg | MEDICATED_PATCH | TRANSDERMAL | 0 refills | Status: AC

## 2017-11-20 MED ORDER — LABETALOL HCL 100 MG PO TABS: 100.0000 mg | ORAL_TABLET | Freq: Two times a day (BID) | ORAL | 0 refills | Status: DC

## 2017-11-20 MED ORDER — AMLODIPINE BESYLATE 10 MG PO TABS: 10.0000 mg | ORAL_TABLET | Freq: Every day | ORAL | 0 refills | Status: DC

## 2017-11-20 MED ORDER — MELATONIN 3 MG PO TABS: 3.0000 mg | ORAL_TABLET | Freq: Every day | ORAL | 0 refills | Status: AC

## 2017-11-20 MED ORDER — IPRATROPIUM-ALBUTEROL 0.5-2.5 (3) MG/3ML IN SOLN: 3.0000 mL | Freq: Two times a day (BID) | RESPIRATORY_TRACT | Status: DC

## 2017-11-20 MED ORDER — HYDRALAZINE HCL 25 MG PO TABS: 25.0000 mg | ORAL_TABLET | Freq: Three times a day (TID) | ORAL | 0 refills | Status: DC

## 2017-11-20 MED ORDER — BENZONATATE 100 MG PO CAPS: 100.0000 mg | ORAL_CAPSULE | Freq: Three times a day (TID) | ORAL | 0 refills | Status: DC | PRN

## 2017-11-20 NOTE — Progress Notes (Signed)
PT Cancellation Note  Patient Details Name: CLARICE ZULAUF MRN: [id number] DOB: Feb 27, 1946   Cancelled Treatment:    Reason Eval/Treat Not Completed: Other (comment)(per RN, pt is agitated. Will hold PT today. Will follow. )   [name] 11/20/2017, 1:34 PM [phone]

## 2017-11-20 NOTE — Care Management Note (Signed)
Case Management Note  Patient Details  Name: Victoria Bird MRN: [id number] Date of Birth: Jul 08, 1946  Subjective/Objective: Received message from [place] patient declines SNF. Spoke to patient in rm about d/c plans-she declines SNF-wants HHC-informed of HHC services-patient is concerned about co pay(she doesn't want services if there is a co pay-she agrees to HHRN/HHPT only-[facility] chosen rep Santiago Glad aware to find out co pay & let patient know. Patient also qualifies for home 02 w/dx. Patient states she wants to know her co pay before deciding if she will accept home 02. [name] will inform patient if there is a co pay, & if there is she will tell patient-await outcome. Patient states she has family support,grandson will pick her up.                   Action/Plan:d/c home w/HHC.   Expected Discharge Date:  11/20/17               Expected Discharge Plan:  [place]  In-House Referral:     Discharge planning Services  CM Consult  Post Acute Care Choice:  Durable Medical Equipment(Has rw) Choice offered to:  Patient  DME Arranged:    DME Agency:     HH Arranged:    [place] Agency:  [name]  Status of Service:  In process, will continue to follow  If discussed at Long Length of Stay Meetings, dates discussed:    Additional Comments:  [name], RN 11/20/2017, 2:54 PM

## 2017-11-20 NOTE — Care Management Note (Signed)
Case Management Note  Patient Details  Name: Victoria Bird MRN: [id number] Date of Birth: May 24, 1946  Subjective/Objective:Patient accepted [name] for HHC,& home 02-[facility] rep Santiago Glad will deliver home 02 travel tank to patient's rm prior d/c. No further CM needs.                   Action/Plan:d/c home w/HHC/dme   Expected Discharge Date:  11/20/17               Expected Discharge Plan:  [place]  In-House Referral:     Discharge planning Services  CM Consult  Post Acute Care Choice:  Durable Medical Equipment(Has rw) Choice offered to:  Patient  DME Arranged:  Oxygen DME Agency:  [place]:  RN, PT [facility] Agency:  [place]  Status of Service:  In process, will continue to follow  If discussed at Long Length of Stay Meetings, dates discussed:    Additional Comments:  [name], RN 11/20/2017, 3:53 PM

## 2017-11-20 NOTE — Progress Notes (Addendum)
SATURATION QUALIFICATIONS: (This note is used to comply with regulatory documentation for home oxygen)  Patient Saturations on Room Air at Rest = 91%  Patient Saturations on Room Air while Ambulating = 88%  Patient Saturations on 1 Liters of oxygen while Ambulating = 91%  Please briefly explain why patient needs home oxygen:

## 2017-11-20 NOTE — Progress Notes (Addendum)
Physical Therapy Treatment Patient Details Name: RHILYN BATTLE MRN: [id number] DOB: 08-22-1946 Today's Date: 11/20/2017    History of Present Illness 72 year old female with history of COPD, hypertension, type 2 diabetes mellitus, ongoing tobacco abuse who presented to the emergency room with shortness of breath, cough productive of yellow sputum  with onset of about 3 days ago, persistent, decided to come to the ED 11/07/17.  She states that everybody in her household is sick with upper respiratory symptoms.   decompensated on 2/9 overnight and required to be transferred to stepdown and placed on BiPAP    PT Comments    Pt very upset about D/C plans for SNF.  Pt has had an extended complicated Acute stay with very little participation with PT.  Pt was able to self rise OOB to [facility] at Supervision level and amb in her room 12 feet on RA using walker.  Pt was steady but limited activity.  "I live in a mobile home, I don't need to walk far".  Avg RA 88% with HR 78.  Pt did require rest breaks between activity.  Discussed assist level needed and pt stated her Royce Macadamia Daughter who lives with her does not work outside of the home and does all cooking and cleaning.  Consulted LPT Santiago Glad pt wished for home vs SNF as long as she has 24/7 assist.  Pt has a walker and would benefit from [name] PT.  Pt wanted to know cost. Notified RN change in D/C plan.   Follow Up Recommendations  Home health PT(consulted LPT pt has 24/7 family at home )     Equipment Recommendations  None recommended by PT    Recommendations for Other Services       Precautions / Restrictions Precautions Precautions: Fall Precaution Comments: monitor sats Restrictions Weight Bearing Restrictions: No    Mobility  Bed Mobility Overal bed mobility: Needs Assistance Bed Mobility: Supine to Sit;Sit to Supine     Supine to sit: Supervision Sit to supine: Supervision   General bed mobility comments: able to self perform with HOB  elevated and use of rails  Transfers Overall transfer level: Needs assistance Equipment used: None;Rolling walker (2 wheeled) Transfers: Sit to/from [employer] Transfers Sit to Stand: Supervision Stand pivot transfers: Supervision       General transfer comment: at Supervision level pt was able to get self OOB to [facility] using B UE's to steady self      RA avg 88%      Mod dyspnea     required sitting rest break  Ambulation/Gait Ambulation/Gait assistance: Supervision Ambulation Distance (Feet): 12 Feet Assistive device: Rolling walker (2 wheeled) Gait Pattern/deviations: Step-to pattern;Step-through pattern;Trunk flexed Gait velocity: decreased   General Gait Details: pt was able to amb around bed using walker at [place]vision level on RA 88%   Stairs            Wheelchair Mobility    Modified Rankin (Stroke Patients Only)       Balance                                            Cognition Arousal/Alertness: Awake/alert Behavior During Therapy: WFL for tasks assessed/performed Overall Cognitive Status: Within Functional Limits for tasks assessed  General Comments: pt upset and wants to go home      Exercises      General Comments        Pertinent Vitals/Pain Pain Assessment: Faces Faces Pain Scale: Hurts a little bit Pain Location: aches and pains "I have Fimbromyalgia"  Pain Descriptors / Indicators: Aching Pain Intervention(s): Monitored during session;Repositioned    Home Living                      Prior Function            PT Goals (current goals can now be found in the care plan section) Progress towards PT goals: Progressing toward goals    Frequency    Min 2X/week      PT Plan Current plan remains appropriate    Co-evaluation              AM-PAC PT "6 Clicks" Daily Activity  Outcome Measure  Difficulty turning over in bed (including adjusting  bedclothes, sheets and blankets)?: A Little Difficulty moving from lying on back to sitting on the side of the bed? : A Little Difficulty sitting down on and standing up from a chair with arms (e.g., wheelchair, bedside commode, etc,.)?: A Little Help needed moving to and from a bed to chair (including a wheelchair)?: A Little Help needed walking in hospital room?: A Little Help needed climbing 3-5 steps with a railing? : A Little 6 Click Score: 18    End of Session Equipment Utilized During Treatment: Gait belt Activity Tolerance: Patient tolerated treatment well Patient left: in bed;with call bell/phone within reach;with bed alarm set Nurse Communication: (pt wants to D/C to home and has 24/7 assist from her 72 year old Royce Macadamia daughter who does all cooking and  cleaning) PT Visit Diagnosis: Unsteadiness on feet (R26.81);Muscle weakness (generalized) (M62.81)     Time: 4132-4401 PT Time Calculation (min) (ACUTE ONLY): 30 min  Charges:  $Gait Training: 8-22 mins $Therapeutic Activity: 8-22 mins                    G Codes:       Rica Koyanagi  PTA [facility]  Acute  Rehab Pager      [phone]

## 2017-11-20 NOTE — Progress Notes (Signed)
CSW contacted by patient's RN and informed that patient now plans to discharge home with home health services. Patient's RN reported that patient's attending MD is aware.  CSW notified patient's RNCM of patient's interest in home health services.  CSW updated patient's selected SNF that patient is now discharging home.  CSW signing off, no other needs identified at this time.  Victoria Bird, [place] Social Worker [facility] Cell#: [phone]

## 2017-11-20 NOTE — Progress Notes (Signed)
CSW followed up with patient regarding SNF selection. Patient reported that she selects [facility].  CSW contacted [facility] SNF, staff member Lexine Baton agreed to start insurance authorization. CSW will continue to follow and assist with discharge planning.  Abundio Miu, [place] Social Worker [facility] Cell#: [phone]

## 2017-11-20 NOTE — Discharge Summary (Addendum)
Physician Discharge Summary  Victoria Bird NUU:[id number] DOB: 21-Jul-1946 DOA: 11/07/2017  PCP: Victoria Koch, MD  Admit date: 11/07/2017 Discharge date: 11/20/2017  Time spent: over 30 minutes  Recommendations for Outpatient Follow-up:  1. Follow up outpatient CBC/CMP 2. Schedule albuterol every 4 hours over the next 48-72 hours until patients breathing back to baseline.  Resume home spiriva.  3. Continue medrol taper (this should have come with patient from hospital, has 1 more dose today on 2/19, 2 doses on 2/20 and 1 dose on 2/21).  4. Patient on 1 L by [place] with activity at time of discharge.  Would follow up repeat home O2 eval at PCP or with pulmonology to determine long term needs  Discharge Diagnoses:  Principal Problem:   COPD with acute exacerbation ([place]) Active Problems:   Hypertension   Diabetes mellitus type 2, controlled ([place])   CKD (chronic kidney disease), stage III ([place])   Tobacco abuse   Discharge Condition: stable  Diet recommendation: heart healthy   Filed Weights   11/08/17 1330 11/14/17 1901 11/20/17 0451  Weight: 71.5 kg (157 lb 11.2 oz) 72.7 kg (160 lb 4.4 oz) 72.7 kg (160 lb 4.4 oz)    History of present illness:  Per PN 72 year old female with history of COPD, hypertension, type 2 diabetes mellitus, ongoing tobacco abuse who presented to the emergency room with shortness of breath, cough productive of yellow sputum with onset of about 3 days ago, persistent, decided to come to the ED. She states that everybody in her household is sick with upper respiratory symptoms. Her respiratory status got [name] little bit worse on 2/8, decompensated on 2/9 overnight and required to be transferred to stepdown and placed on BiPAP.  She was treated for [name] COPD exacerbation with steroids, azithromycin, and scheduled nebs.  She did require transfer to ICU and bipap briefly during admission.  She's now been transitioned to PO steroids and her O2 needs are decreasing  (on 1 L by [place] today).  She's had persistent hypertension that has improved with adjustment of antihypertensives here.   Addendum: Initially planned on discharge to SNF.  Pt improved on 1 L by [employer] with improved exam.  Discharge orders were placed, but pt decided to go home instead.  She understands we wanted her to go to SNF for rehab and to get [name] bit stronger while she recovers from COPD, but this is something she's not interested in.  Pt was evaluated by PT and found to sat 91% on room air and 88% with ambulation.  Pt sent home with home O2, steroid taper, recommendation to follow up with PCP and pulmonology.  Hospital Course:  COPD exacerbation with acute on chronic hypoxic and hypercarbic respiratory failure, acute respiratory acidosis No O2 at baseline.   1 L O2 by  today and improved air movement, still some scattered wheezing, but improved (will need repeat eval for O2 needs with PCP or pulmonology) Wean as tolerated, goal O2 88-92% Discharge with albuterol and spiriva Continue to encourage out of bed and working with PT (she's been in bed [name] great deal of time here and nursing has had some difficulty motivating her to get OOB) -Patient with increased cough on 2/8, 2/8-2/9 overnight she had worsening respiratory status, ABG showed acute respiratory acidosis in the setting of acute hypoxic and hypercarbic respiratory failure requiring stepdown transfer and BiPAP -She has been off BiPAP, pulmonary was consulted (appreciate recs) - s/p 5 days azithromycin  - Continue to taper  steroids (consider longer course as needed) - Continue nebulizers. Allergic to Symbicort - PT recommending SNF  Type 2 diabetes mellitus - regular diet at pt request today - SSI during admission - resume metformin at discharge  Hypertension - moderate HTN, improved overall - amlodipine to 10 mg.  Clonidine patch.  Hydral q8 25 mg.  Labetalol 100 mg BID.  Chronic kidney disease stage III - Baseline 1.3-1.6 on  admission, peaked to 1.92 here - continue to monitor, stable  Leukocytosis:  - likely 2/2 steroids, follow   Neck Pain: - flexeril   Tobacco abuse -Unfortunately continues to smoke, counseled to quit by previous provider  Insomnia: will try to get melatonin (nonformulary) per pt request  Procedures:  none  Consultations:  PCCM   Discharge Exam: Vitals:   11/20/17 0958 11/20/17 1255  BP: (!) 146/54 133/69  Pulse: 69 69  Resp: 18 18  Temp: 97.8 F (36.6 C) 97.7 F (36.5 C)  SpO2: 92% 91%   Frustrated.  Wants to get well enough to be able to go home.  Discussed that at this point, her oxygen levels are improved, exam improved as well.  Important to get stronger based on PT recommendation before she goes back home.  Will continue steroids, nebs, and O2 at SNF, but at this point I don't think we need further hospitalization.  Pt frustrated with this.    Addendum: pt worked with PT and found to sat normally on RA and 88% with activity and 91% with 1 L.  Decided she prefers to go home with home health PT.  Discussed recommendations which patient understands, but prefers at this time to go home.  General: No acute distress. Cardiovascular: Heart sounds show [name] regular rate, and rhythm. No gallops or rubs. No murmurs. No JVD. Lungs: Improved air movement from yesterday, still some scattered wheezing.  Still harsh cough, but overall improved.  On 1 L by [place].  Abdomen: Soft, nontender, nondistended with normal active bowel sounds. No masses. No hepatosplenomegaly. Neurological: Alert and oriented 3. Moves all extremities 4. Cranial nerves II through XII grossly intact. Skin: Warm and dry. No rashes or lesions. Extremities: No clubbing or cyanosis. No edema.  Psychiatric: Mood and affect are normal. Insight and judgment are appropriate.  Discharge Instructions   Discharge Instructions    Call MD for:  difficulty breathing, headache or visual disturbances   Complete by:  As  directed    Call MD for:  extreme fatigue   Complete by:  As directed    Call MD for:  persistant dizziness or light-headedness   Complete by:  As directed    Call MD for:  persistant nausea and vomiting   Complete by:  As directed    Call MD for:  severe uncontrolled pain   Complete by:  As directed    Call MD for:  temperature >100.4   Complete by:  As directed    Diet - low sodium heart healthy   Complete by:  As directed    Discharge instructions   Complete by:  As directed    You were seen for Paulyne Mooty COPD exacerbation.  You improved with steroids and nebulizer treatments as well as antibiotics.  Please follow up with your PCP within Glorian Mcdonell few days.  Continue your steroid course.  Schedule your albuterol every 4 hours for the next 2-3 days until you feel like your breathing is completely back to normal, then resume as needed.  Restart your spiriva.  Please follow up with pulmonology as an outpatient.   We are discharging you with home oxygen with activity.  Your oxygen level at rest was ok at discharge, but you would benefit from Loryn Haacke little oxygen with activity.  Please follow up with your PCP and pulmonology to determine whether to continue this.  Your blood pressures look better.  We've started you on several new blood pressure medications.  Please follow up with your PCP regarding your new blood pressure medications and to see if any adjustments need to be made.    Return if you have new, recurrent, or worsening symptoms.    Please ask your PCP to request records from this hospitalization so they know what was done and what the next steps are.   For home use only DME oxygen   Complete by:  As directed    SATURATION QUALIFICATIONS: (This note is used to comply with regulatory documentation for home oxygen)  Patient Saturations on Room Air at Rest = 91%  Patient Saturations on Room Air while Ambulating = 88%  Patient Saturations on 1 Liters of oxygen while Ambulating = 91%  Please  briefly explain why patient needs home oxygen:   Mode or (Route):  Nasal cannula   Liters per Minute:  1   Frequency:  Continuous (stationary and portable oxygen unit needed)   Oxygen delivery system:  Gas   Increase activity slowly   Complete by:  As directed      Allergies as of 11/20/2017      Reactions   Aspartame And Phenylalanine Nausea And Vomiting   Patient says she is allergic to all artificial sweeteners   Doxycycline Other (See Comments)   Nausea, vomiting, HA, double vision   Tramadol Shortness Of Breath, Nausea Only   Codeine Itching   Has not tried benadryl to alleviate side effects   Neurontin [gabapentin] Other (See Comments)   Dizzy, "drugged" feeling, sleepy   Sulfa Antibiotics Other (See Comments)   Kidney problem    Symbicort [budesonide-formoterol Fumarate]    Swelling of face and inside of mouth per pt   Penicillins Rash   Has patient had Demitria Hay PCN reaction causing immediate rash, facial/tongue/throat swelling, SOB or lightheadedness with hypotension: No Has patient had Islam Villescas PCN reaction causing severe rash involving mucus membranes or skin necrosis: No Has patient had Guiseppe Flanagan PCN reaction that required hospitalization No Has patient had Kron Everton PCN reaction occurring within the last 10 years: No If all of the above answers are "NO", then may proceed with Cephalosporin use.   Prednisone Rash   Patient stated she received Prednisone while she was in the hospital and experienced Jenipher Havel rash and "extreme pain.'      Medication List    STOP taking these medications   losartan-hydrochlorothiazide 100-12.5 MG tablet Commonly known as:  HYZAAR     TAKE these medications   albuterol 0.63 MG/3ML nebulizer solution Commonly known as:  ACCUNEB Take 1 ampule by nebulization every 6 (six) hours as needed for wheezing or shortness of breath.   albuterol 108 (90 Base) MCG/ACT inhaler Commonly known as:  PROAIR HFA Inhale 2 puffs into the lungs every 6 (six) hours as needed for wheezing  or shortness of breath.   amLODipine 10 MG tablet Commonly known as:  NORVASC Take 1 tablet (10 mg total) by mouth daily. Start taking on:  11/21/2017   benzonatate 100 MG capsule Commonly known as:  TESSALON Take 1 capsule (100 mg total) by mouth 3 (three)  times daily as needed for cough.   blood glucose meter kit and supplies Kit Use to test blood sugar   cloNIDine 0.1 mg/24hr patch Commonly known as:  CATAPRES - Dosed in mg/24 hr Place 1 patch (0.1 mg total) onto the skin once [place] week. Start taking on:  11/24/2017   glucose blood test strip Commonly known as:  IGLUCOSE TEST STRIPS Use as instructed   hydrALAZINE 25 MG tablet Commonly known as:  APRESOLINE Take 1 tablet (25 mg total) by mouth every 8 (eight) hours.   labetalol 100 MG tablet Commonly known as:  NORMODYNE Take 1 tablet (100 mg total) by mouth 2 (two) times daily.   lactose free nutrition Liqd Take 237 mLs by mouth 3 (three) times daily with meals.   Lancets Misc Use to test blood sugar one daily   Melatonin 3 MG Tabs Take 1 tablet (3 mg total) by mouth at bedtime.   metFORMIN 500 MG tablet Commonly known as:  GLUCOPHAGE TAKE 1 TABLET(500 MG) BY MOUTH TWICE DAILY WITH Kimani Hovis MEAL   methylPREDNISolone 4 MG Tbpk tablet Commonly known as:  MEDROL DOSEPAK Continue taper prescribed from hospital   pantoprazole 40 MG tablet Commonly known as:  PROTONIX Take 1 tablet (40 mg total) by mouth daily.   tiotropium 18 MCG inhalation capsule Commonly known as:  SPIRIVA HANDIHALER Place 1 capsule (18 mcg total) into inhaler and inhale daily.            Durable Medical Equipment  (From admission, onward)        Start     Ordered   11/20/17 0000  For home use only DME oxygen    Comments:  SATURATION QUALIFICATIONS: (This note is used to comply with regulatory documentation for home oxygen)  Patient Saturations on Room Air at Rest = 91%  Patient Saturations on Room Air while Ambulating = 88%  Patient  Saturations on 1 Liters of oxygen while Ambulating = 91%  Please briefly explain why patient needs home oxygen:  Question Answer Comment  Mode or (Route) Nasal cannula   Liters per Minute 1   Frequency Continuous (stationary and portable oxygen unit needed)   Oxygen delivery system Gas      11/20/17 1534     Allergies  Allergen Reactions  . Aspartame And Phenylalanine Nausea And Vomiting    Patient says she is allergic to all artificial sweeteners  . Doxycycline Other (See Comments)    Nausea, vomiting, HA, double vision  . Tramadol Shortness Of Breath and Nausea Only  . Codeine Itching    Has not tried benadryl to alleviate side effects  . Neurontin [Gabapentin] Other (See Comments)    Dizzy, "drugged" feeling, sleepy  . Sulfa Antibiotics Other (See Comments)    Kidney problem   . Symbicort [Budesonide-Formoterol Fumarate]     Swelling of face and inside of mouth per pt  . Penicillins Rash    Has patient had Elverda Wendel PCN reaction causing immediate rash, facial/tongue/throat swelling, SOB or lightheadedness with hypotension: No Has patient had Aaronjames Kelsay PCN reaction causing severe rash involving mucus membranes or skin necrosis: No Has patient had Neiva Maenza PCN reaction that required hospitalization No Has patient had Cipriano Millikan PCN reaction occurring within the last 10 years: No If all of the above answers are "NO", then may proceed with Cephalosporin use.  . Prednisone Rash    Patient stated she received Prednisone while she was in the hospital and experienced Tamanika Heiney rash and "extreme pain.'  Contact information for follow-up providers    Magdalen Spatz, NP Follow up on 11/29/2017.   Specialty:  Pulmonary Disease Why:  Appt at 11:30 AM.  Please arrive at 11:15 for check in.   Contact information: [address] [place] [address] [phone]        Victoria Koch, MD Follow up.   Specialty:  Internal Medicine Contact information: [place]  [id number] [phone]        [facility] Follow up.   Specialty:  Home Health Services Why:  [facility] nursing/physical therapy Contact information: [place] [address] [place] Follow up.   Why:  home oxygen Contact information: [address] [place] [address] [phone]            Contact information for after-discharge care    Destination    [facility] Follow up.   Service:  Skilled Nursing Contact information: [address] [place] [phone]                   The results of significant diagnostics from this hospitalization (including imaging, microbiology, ancillary and laboratory) are listed below for reference.    Significant Diagnostic Studies: Dg Chest 2 View  Result Date: 11/07/2017 CLINICAL DATA:  Initial evaluation for acute productive cough, shortness of breath. History of COPD, smoking. EXAM: CHEST  2 VIEW COMPARISON:  Prior radiograph from 03/18/2017. FINDINGS: Cardiac and mediastinal silhouettes are stable in size and contour, and remain within normal limits. Aortic atherosclerosis. Lungs are hyperinflated with changes related to COPD. No superimposed focal infiltrates. No pulmonary edema or pleural effusion. No pneumothorax. No acute osseous abnormality.  Osteopenia. IMPRESSION: 1. COPD. 2. No other active cardiopulmonary disease identified. 3. Aortic atherosclerosis. Electronically Signed   By: Jeannine Boga M.D.   On: 11/07/2017 22:10   Dg Chest Port 1 View  Result Date: 11/10/2017 CLINICAL DATA:  72 y/o  F; shortness of breath. EXAM: PORTABLE CHEST 1 VIEW COMPARISON:  11/07/2017 chest radiograph.  06/08/2016 chest CT. FINDINGS: Stable normal cardiac silhouette pole given projection and technique. Aortic atherosclerosis with calcification. Stable emphysema and chronic bronchitic changes  compatible with COPD. No focal consolidation. No pleural effusion or pneumothorax. No acute osseous abnormality is evident. IMPRESSION: Stable findings of COPD. No focal consolidation. Aortic atherosclerosis. Electronically Signed   By: Kristine Garbe M.D.   On: 11/10/2017 02:47    Microbiology: No results found for this or any previous visit (from the past 240 hour(s)).   Labs: Basic Metabolic Panel: Recent Labs  Lab 11/16/17 0458 11/17/17 0452 11/18/17 0518 11/19/17 0537 11/20/17 0509  NA 139 140 135 137 139  K 4.5 4.4 4.9 5.1 4.8  CL 108 107 104 105 107  CO2 '24 25 24 24 24  ' GLUCOSE 137* 123* 149* 142* 160*  BUN 57* 55* 53* 51* 50*  CREATININE 1.43* 1.39* 1.31* 1.36* 1.51*  CALCIUM 8.4* 8.4* 8.2* 8.6* 8.1*  MG  --  2.0 1.9  --  2.0   Liver Function Tests: No results for input(s): AST, ALT, ALKPHOS, BILITOT, PROT, ALBUMIN in the last 168 hours. No results for input(s): LIPASE, AMYLASE in the last 168 hours. No results for input(s): AMMONIA in the last 168 hours. CBC: Recent Labs  Lab 11/16/17 0458 11/17/17 3875 11/18/17 0518 11/19/17 0537 11/20/17 6433  WBC 10.7* 10.6* 15.4* 13.1* 14.4*  HGB 13.5 13.3 13.6 13.6 13.0  HCT 41.2 41.3 41.6 41.2 40.4  MCV 90.2 90.2 89.8 89.8 90.8  PLT 202 187 179 188 181   Cardiac Enzymes: No results for input(s): CKTOTAL, CKMB, CKMBINDEX, TROPONINI in the last 168 hours. BNP: BNP (last 3 results) No results for input(s): BNP in the last 8760 hours.  ProBNP (last 3 results) No results for input(s): PROBNP in the last 8760 hours.  CBG: Recent Labs  Lab 11/19/17 1654 11/19/17 2100 11/20/17 0736 11/20/17 1137 11/20/17 1658  GLUCAP 166* 133* 99 162* 109*       Signed:  Fayrene Helper MD.  [facility] 11/20/2017, 9:50 PM

## 2017-11-20 NOTE — Care Management Important Message (Signed)
Important Message  Patient Details  Name: Victoria Bird MRN: [id number] Date of Birth: 1946-09-03   Medicare Important Message Given:  Yes    Kerin Salen 11/20/2017, 10:51 AMImportant Message  Patient Details  Name: Victoria Bird MRN: [id number] Date of Birth: August 06, 1946   Medicare Important Message Given:  Yes    Kerin Salen 11/20/2017, 10:51 AM

## 2017-11-21 ENCOUNTER — Telehealth: Payer: Self-pay | Admitting: Internal Medicine

## 2017-11-21 ENCOUNTER — Telehealth: Payer: Self-pay | Admitting: *Deleted

## 2017-11-21 NOTE — Telephone Encounter (Signed)
Pt was on TCM list admitted 11/07/17 for COPD sxs. Pt decline appt w/PCP she has made hosp f/u appt w/pulmonology for 11/29/17 w/ PA Eric Form.Gust Rung

## 2017-11-21 NOTE — Telephone Encounter (Signed)
Copied from [place] [id number]. Topic: Quick Communication - See Telephone Encounter >> Nov 21, 2017 12:32 PM Margot Ables wrote: CRM for notification. See Telephone encounter for: 11/21/17.  Pt wanting to get a bedside commode from [facility], oxygen provider. [facility] is stating that pt needs RX for bedside commode.

## 2017-11-22 NOTE — Telephone Encounter (Signed)
Faxed to [facility] and patient informed we have faxed it

## 2017-11-22 NOTE — Telephone Encounter (Signed)
Fine to give verbal or printed rx given to [facility] which can be faxed.

## 2017-11-23 DIAGNOSIS — N183 Chronic kidney disease, stage 3 (moderate): Secondary | ICD-10-CM | POA: Diagnosis not present

## 2017-11-23 DIAGNOSIS — J441 Chronic obstructive pulmonary disease with (acute) exacerbation: Secondary | ICD-10-CM | POA: Diagnosis not present

## 2017-11-23 DIAGNOSIS — I129 Hypertensive chronic kidney disease with stage 1 through stage 4 chronic kidney disease, or unspecified chronic kidney disease: Secondary | ICD-10-CM | POA: Diagnosis not present

## 2017-11-23 DIAGNOSIS — G4700 Insomnia, unspecified: Secondary | ICD-10-CM | POA: Diagnosis not present

## 2017-11-23 DIAGNOSIS — Z9981 Dependence on supplemental oxygen: Secondary | ICD-10-CM | POA: Diagnosis not present

## 2017-11-23 DIAGNOSIS — E1122 Type 2 diabetes mellitus with diabetic chronic kidney disease: Secondary | ICD-10-CM | POA: Diagnosis not present

## 2017-11-23 DIAGNOSIS — Z720 Tobacco use: Secondary | ICD-10-CM | POA: Diagnosis not present

## 2017-11-23 DIAGNOSIS — Z7984 Long term (current) use of oral hypoglycemic drugs: Secondary | ICD-10-CM | POA: Diagnosis not present

## 2017-11-26 ENCOUNTER — Telehealth: Payer: Self-pay | Admitting: Emergency Medicine

## 2017-11-26 DIAGNOSIS — Z720 Tobacco use: Secondary | ICD-10-CM | POA: Diagnosis not present

## 2017-11-26 DIAGNOSIS — G4700 Insomnia, unspecified: Secondary | ICD-10-CM | POA: Diagnosis not present

## 2017-11-26 DIAGNOSIS — Z9981 Dependence on supplemental oxygen: Secondary | ICD-10-CM | POA: Diagnosis not present

## 2017-11-26 DIAGNOSIS — E1122 Type 2 diabetes mellitus with diabetic chronic kidney disease: Secondary | ICD-10-CM | POA: Diagnosis not present

## 2017-11-26 DIAGNOSIS — Z7984 Long term (current) use of oral hypoglycemic drugs: Secondary | ICD-10-CM | POA: Diagnosis not present

## 2017-11-26 DIAGNOSIS — J441 Chronic obstructive pulmonary disease with (acute) exacerbation: Secondary | ICD-10-CM | POA: Diagnosis not present

## 2017-11-26 DIAGNOSIS — I129 Hypertensive chronic kidney disease with stage 1 through stage 4 chronic kidney disease, or unspecified chronic kidney disease: Secondary | ICD-10-CM | POA: Diagnosis not present

## 2017-11-26 DIAGNOSIS — N183 Chronic kidney disease, stage 3 (moderate): Secondary | ICD-10-CM | POA: Diagnosis not present

## 2017-11-26 NOTE — Telephone Encounter (Signed)
Yes ok to order as they have requested

## 2017-11-26 NOTE — Telephone Encounter (Signed)
RB please advise, [facility] is wanting to do home physical therapy 1 visit every other week for three weeks. They are needing a verbal for this, is this ok. Thanks.

## 2017-11-27 NOTE — Telephone Encounter (Signed)
Called and spoke with Vickii Penna giving her the verbal order she was needing for pt per Dr. Lamonte Sakai.  Shandra expressed understanding. Nothing further needed at this current time.

## 2017-11-29 ENCOUNTER — Inpatient Hospital Stay: Payer: Medicare Other | Admitting: Acute Care

## 2017-11-30 DIAGNOSIS — J441 Chronic obstructive pulmonary disease with (acute) exacerbation: Secondary | ICD-10-CM | POA: Diagnosis not present

## 2017-11-30 DIAGNOSIS — J168 Pneumonia due to other specified infectious organisms: Secondary | ICD-10-CM | POA: Diagnosis not present

## 2017-11-30 DIAGNOSIS — R269 Unspecified abnormalities of gait and mobility: Secondary | ICD-10-CM | POA: Diagnosis not present

## 2017-11-30 DIAGNOSIS — J449 Chronic obstructive pulmonary disease, unspecified: Secondary | ICD-10-CM | POA: Diagnosis not present

## 2017-12-05 ENCOUNTER — Telehealth: Payer: Self-pay | Admitting: Internal Medicine

## 2017-12-05 ENCOUNTER — Other Ambulatory Visit: Payer: Self-pay | Admitting: Internal Medicine

## 2017-12-05 DIAGNOSIS — E0800 Diabetes mellitus due to underlying condition with hyperosmolarity without nonketotic hyperglycemic-hyperosmolar coma (NKHHC): Secondary | ICD-10-CM

## 2017-12-05 NOTE — Telephone Encounter (Signed)
Copied from [place]. Topic: Quick Communication - See Telephone Encounter >> Dec 05, 2017  2:09 PM Bea Graff, NT wrote: CRM for notification. See Telephone encounter for: Victoria Bird with [facility] calling and states that when the pt was discharged from the hospital the pt was suppose to have a rx for flexeril but the hospital did not send this to the pharmacy and Victoria Bird would like to see if Dr. Sharlet Salina would be willing to fill this for this pt? Fax over to [facility] on [address]. Or call pt if not able to do this or to let her know its been sent.   12/05/17.

## 2017-12-06 NOTE — Telephone Encounter (Signed)
Can you please make patient a hospital follow up if she wants medication. Thank you

## 2017-12-06 NOTE — Telephone Encounter (Signed)
Called patient and informed of MD response. Patient said that she is going to check with her daughter to see when she can bring her and will call back to schedule an appointment.

## 2017-12-06 NOTE — Telephone Encounter (Signed)
She was discharged about 2-3 weeks ago. She has not made hospital follow up. Will decline at this time as I do not see any mention of this in the discharge summary.

## 2017-12-12 DIAGNOSIS — G4700 Insomnia, unspecified: Secondary | ICD-10-CM | POA: Diagnosis not present

## 2017-12-12 DIAGNOSIS — E1122 Type 2 diabetes mellitus with diabetic chronic kidney disease: Secondary | ICD-10-CM | POA: Diagnosis not present

## 2017-12-12 DIAGNOSIS — Z7984 Long term (current) use of oral hypoglycemic drugs: Secondary | ICD-10-CM | POA: Diagnosis not present

## 2017-12-12 DIAGNOSIS — Z9981 Dependence on supplemental oxygen: Secondary | ICD-10-CM | POA: Diagnosis not present

## 2017-12-12 DIAGNOSIS — J441 Chronic obstructive pulmonary disease with (acute) exacerbation: Secondary | ICD-10-CM | POA: Diagnosis not present

## 2017-12-12 DIAGNOSIS — I129 Hypertensive chronic kidney disease with stage 1 through stage 4 chronic kidney disease, or unspecified chronic kidney disease: Secondary | ICD-10-CM | POA: Diagnosis not present

## 2017-12-12 DIAGNOSIS — Z720 Tobacco use: Secondary | ICD-10-CM | POA: Diagnosis not present

## 2017-12-12 DIAGNOSIS — N183 Chronic kidney disease, stage 3 (moderate): Secondary | ICD-10-CM | POA: Diagnosis not present

## 2017-12-14 DIAGNOSIS — Z7984 Long term (current) use of oral hypoglycemic drugs: Secondary | ICD-10-CM | POA: Diagnosis not present

## 2017-12-14 DIAGNOSIS — E1122 Type 2 diabetes mellitus with diabetic chronic kidney disease: Secondary | ICD-10-CM | POA: Diagnosis not present

## 2017-12-14 DIAGNOSIS — Z9981 Dependence on supplemental oxygen: Secondary | ICD-10-CM | POA: Diagnosis not present

## 2017-12-14 DIAGNOSIS — N183 Chronic kidney disease, stage 3 (moderate): Secondary | ICD-10-CM | POA: Diagnosis not present

## 2017-12-14 DIAGNOSIS — J441 Chronic obstructive pulmonary disease with (acute) exacerbation: Secondary | ICD-10-CM | POA: Diagnosis not present

## 2017-12-14 DIAGNOSIS — Z720 Tobacco use: Secondary | ICD-10-CM

## 2017-12-14 DIAGNOSIS — G4700 Insomnia, unspecified: Secondary | ICD-10-CM | POA: Diagnosis not present

## 2017-12-14 DIAGNOSIS — I129 Hypertensive chronic kidney disease with stage 1 through stage 4 chronic kidney disease, or unspecified chronic kidney disease: Secondary | ICD-10-CM | POA: Diagnosis not present

## 2017-12-18 DIAGNOSIS — J449 Chronic obstructive pulmonary disease, unspecified: Secondary | ICD-10-CM | POA: Diagnosis not present

## 2017-12-21 ENCOUNTER — Other Ambulatory Visit: Payer: Self-pay | Admitting: Internal Medicine

## 2017-12-21 NOTE — Telephone Encounter (Signed)
Meds: Amlodipine, Labetalol (both meds ordered while in hospital)  PCP: [place]: [facility], [address] AT [facility] [address] [phone] (Phone) [phone] (Fax)

## 2017-12-21 NOTE — Telephone Encounter (Signed)
Copied from [place] [id number]. Topic: Quick Communication - Rx Refill/Question >> Dec 21, 2017  1:55 PM Wynetta Emery, [place] C wrote: Medication: amLODipine (NORVASC) 10 MG tablet and also labetalol (NORMODYNE) 100 MG tablet ---pt was put on these medications when she was in the hospital.    Has the patient contacted their pharmacy? Yes, but request not showing   (Agent: If no, request that the patient contact the pharmacy for the refill.)  Preferred Pharmacy (with phone number or street name): [facility] [place], [address] AT [facility] [address] [phone] (Phone) [phone] (Fax)      Agent: Please be advised that RX refills may take up to 3 business days. We ask that you follow-up with your pharmacy.

## 2017-12-24 ENCOUNTER — Other Ambulatory Visit: Payer: Self-pay | Admitting: Internal Medicine

## 2017-12-24 MED ORDER — LABETALOL HCL 100 MG PO TABS: 100.0000 mg | ORAL_TABLET | Freq: Two times a day (BID) | ORAL | 0 refills | Status: DC

## 2017-12-24 MED ORDER — AMLODIPINE BESYLATE 10 MG PO TABS: 10.0000 mg | ORAL_TABLET | Freq: Every day | ORAL | 0 refills | Status: DC

## 2018-01-08 ENCOUNTER — Encounter: Payer: Self-pay | Admitting: Internal Medicine

## 2018-01-08 ENCOUNTER — Ambulatory Visit (INDEPENDENT_AMBULATORY_CARE_PROVIDER_SITE_OTHER): Payer: Medicare Other | Admitting: Internal Medicine

## 2018-01-08 VITALS — BP 142/62 | HR 67 | Temp 97.7°F | Ht 63.0 in | Wt 163.0 lb

## 2018-01-08 DIAGNOSIS — G894 Chronic pain syndrome: Secondary | ICD-10-CM | POA: Diagnosis not present

## 2018-01-08 DIAGNOSIS — J410 Simple chronic bronchitis: Secondary | ICD-10-CM

## 2018-01-08 DIAGNOSIS — I10 Essential (primary) hypertension: Secondary | ICD-10-CM

## 2018-01-08 MED ORDER — CYCLOBENZAPRINE HCL 5 MG PO TABS: 5.0000 mg | ORAL_TABLET | Freq: Three times a day (TID) | ORAL | 1 refills | Status: DC | PRN

## 2018-01-08 MED ORDER — PANTOPRAZOLE SODIUM 40 MG PO TBEC: 40.0000 mg | DELAYED_RELEASE_TABLET | Freq: Every day | ORAL | 1 refills | Status: DC

## 2018-01-08 MED ORDER — HYDRALAZINE HCL 25 MG PO TABS: 25.0000 mg | ORAL_TABLET | Freq: Three times a day (TID) | ORAL | 6 refills | Status: DC

## 2018-01-08 NOTE — Progress Notes (Signed)
   Subjective:    Patient ID: Victoria Bird, female    DOB: July 20, 1946, 72 y.o.   MRN: [id number]  HPI The patient is a 72 YO female coming in for hospital follow up (in for copd exacerbation, treated with antibiotics, steroids, ended up in step down on bipap due to respiratory failure, advised to go to SNF but went home instead). She is doing okay since being home. Needs home health orders to continue. She is still weak. Breathing is okay. Still using oxygen with walking at home and has followed up with pulmonary. She denies worsening. Denies chest pains. Denies diarrhea or constipation. Having some pains in her back and wants to get refill on muscle relaxer which has helped in the past.   PMH, [name], social history reviewed and updated.   Review of Systems  Constitutional: Positive for activity change and fatigue. Negative for appetite change, fever and unexpected weight change.  HENT: Negative.   Eyes: Negative.   Respiratory: Positive for shortness of breath. Negative for cough and chest tightness.   Cardiovascular: Negative for chest pain, palpitations and leg swelling.  Gastrointestinal: Negative for abdominal distention, abdominal pain, constipation, diarrhea, nausea and vomiting.  Musculoskeletal: Positive for back pain and myalgias.  Skin: Negative.   Neurological: Negative.   Psychiatric/Behavioral: Negative.       Objective:   Physical Exam  Constitutional: She is oriented to person, place, and time. She appears well-developed and well-nourished.  HENT:  Head: Normocephalic and atraumatic.  Eyes: EOM are normal.  Neck: Normal range of motion.  Cardiovascular: Normal rate and regular rhythm.  Pulmonary/Chest: Effort normal and breath sounds normal. No respiratory distress. She has no wheezes. She has no rales.  Abdominal: Soft. Bowel sounds are normal. She exhibits no distension. There is no tenderness. There is no rebound.  Musculoskeletal: She exhibits no edema.    Neurological: She is alert and oriented to person, place, and time. Coordination normal.  Skin: Skin is warm and dry.  Psychiatric: She has a normal mood and affect.   Vitals:   01/08/18 1455  BP: (!) 142/62  Pulse: 67  Temp: 97.7 F (36.5 C)  TempSrc: Oral  SpO2: 93%  Weight: 163 lb (73.9 kg)  Height: 5\' 3"  (1.6 m)      Assessment & Plan:

## 2018-01-08 NOTE — Patient Instructions (Signed)
We have sent in the refills.   We have sent in the flexeril for the pain which you can take up to 1 pill 3 times per day.

## 2018-01-11 NOTE — Assessment & Plan Note (Signed)
Appears to be stable today. Using albuterol and spiriva still. If another exacerbation may need escalation of therapy.

## 2018-01-11 NOTE — Assessment & Plan Note (Signed)
Refill flexeril for pain.

## 2018-01-11 NOTE — Assessment & Plan Note (Signed)
BP at goal on amlodipine, refill hydralazine, labetalol. Recent BMP without indication for change.

## 2018-01-18 DIAGNOSIS — J449 Chronic obstructive pulmonary disease, unspecified: Secondary | ICD-10-CM | POA: Diagnosis not present

## 2018-02-17 DIAGNOSIS — J449 Chronic obstructive pulmonary disease, unspecified: Secondary | ICD-10-CM | POA: Diagnosis not present

## 2018-02-25 ENCOUNTER — Other Ambulatory Visit: Payer: Self-pay | Admitting: Internal Medicine

## 2018-02-25 DIAGNOSIS — E0800 Diabetes mellitus due to underlying condition with hyperosmolarity without nonketotic hyperglycemic-hyperosmolar coma (NKHHC): Secondary | ICD-10-CM

## 2018-02-26 ENCOUNTER — Other Ambulatory Visit: Payer: Self-pay | Admitting: Internal Medicine

## 2018-02-26 DIAGNOSIS — E0800 Diabetes mellitus due to underlying condition with hyperosmolarity without nonketotic hyperglycemic-hyperosmolar coma (NKHHC): Secondary | ICD-10-CM

## 2018-03-20 DIAGNOSIS — J449 Chronic obstructive pulmonary disease, unspecified: Secondary | ICD-10-CM | POA: Diagnosis not present

## 2018-03-21 ENCOUNTER — Other Ambulatory Visit: Payer: Self-pay | Admitting: Internal Medicine

## 2018-04-10 ENCOUNTER — Ambulatory Visit (INDEPENDENT_AMBULATORY_CARE_PROVIDER_SITE_OTHER): Payer: Medicare Other | Admitting: Pulmonary Disease

## 2018-04-10 ENCOUNTER — Other Ambulatory Visit: Payer: Self-pay | Admitting: Pulmonary Disease

## 2018-04-10 ENCOUNTER — Other Ambulatory Visit (INDEPENDENT_AMBULATORY_CARE_PROVIDER_SITE_OTHER): Payer: Medicare Other

## 2018-04-10 ENCOUNTER — Ambulatory Visit (INDEPENDENT_AMBULATORY_CARE_PROVIDER_SITE_OTHER)
Admission: RE | Admit: 2018-04-10 | Discharge: 2018-04-10 | Disposition: A | Discharge status: Home / Self Care | Payer: Medicare Other | Source: Ambulatory Visit | Attending: Pulmonary Disease | Admitting: Pulmonary Disease

## 2018-04-10 ENCOUNTER — Encounter: Payer: Self-pay | Admitting: Pulmonary Disease

## 2018-04-10 VITALS — BP 150/68 | HR 80 | Ht 64.0 in | Wt 171.0 lb

## 2018-04-10 DIAGNOSIS — F17200 Nicotine dependence, unspecified, uncomplicated: Secondary | ICD-10-CM

## 2018-04-10 DIAGNOSIS — G4733 Obstructive sleep apnea (adult) (pediatric): Secondary | ICD-10-CM | POA: Diagnosis not present

## 2018-04-10 DIAGNOSIS — R0600 Dyspnea, unspecified: Secondary | ICD-10-CM

## 2018-04-10 DIAGNOSIS — J9611 Chronic respiratory failure with hypoxia: Secondary | ICD-10-CM

## 2018-04-10 DIAGNOSIS — J449 Chronic obstructive pulmonary disease, unspecified: Secondary | ICD-10-CM | POA: Diagnosis not present

## 2018-04-10 DIAGNOSIS — M7989 Other specified soft tissue disorders: Secondary | ICD-10-CM | POA: Diagnosis not present

## 2018-04-10 MED ORDER — FLUTICASONE-UMECLIDIN-VILANT 100-62.5-25 MCG/INH IN AEPB: 1.0000 | INHALATION_SPRAY | Freq: Every day | RESPIRATORY_TRACT | 0 refills | Status: DC

## 2018-04-10 MED ORDER — FUROSEMIDE 40 MG PO TABS: 40.0000 mg | ORAL_TABLET | Freq: Every day | ORAL | 0 refills | Status: DC

## 2018-04-10 NOTE — Progress Notes (Signed)
Synopsis: Former patient of Dr. Ashok Cordia with COPD, hospitalized for a COPD exacerbation in February 2019, says she quit smoking in February 2019.  Subjective:   PATIENT ID: Victoria Bird GENDER: female DOB: 1945/12/08, MRN: [id number]   HPI  Chief Complaint  Patient presents with  . Follow-up    feels weak with walking, shortness of breath, difficulty swallowing    Victoria Bird comes to clinic today to establish care with me for her severe COPD, chronic respiratory failure with hypoxemia, prior tobacco use.  She was previously followed by my partner for a number of years.  She was hospitalized in February of this year for a severe COPD exacerbation and was in the hospital for quite some time.  She was seen by the pulmonary critical care team and at that time notes document that she was supposed to be taking Pulmicort nebulized twice a day as well as scheduled DuoNeb.  However, at some point after hospital discharge she was put back on Spiriva and she says this is the only controller medicine she is taking.  She does not use her nebulizer at all.  She says she will use Ventolin from time to time.  She continues to suffer from shortness of breath and says that this occurs with nearly any activity.  She has cough with mucus production which is typically clear in color.  She says that she has also been retaining fluid lately and she says she thinks that she has gained about 10 pounds in terms of fluid.  This is made her feel more short of breath and she thinks may be contributing to the cough.  She has trouble swallowing, mostly because of the shortness of breath but not because of food getting stuck.  She has been using oxygen ever since she was discharged home.  She says that she uses it somewhat sporadically.  She has not been told whether or not she can stop taking it.  She tells me that she quit smoking in February 2019.  Past Medical History:  Diagnosis Date  . Anxiety   . Chronic pain   .  Colon polyps    adenomatous  . COPD (chronic obstructive pulmonary disease) ([place])   . Diabetes mellitus   . Diverticulitis   . Diverticulosis   . Emphysema lung ([place])   . Gall stones   . GERD (gastroesophageal reflux disease)   . H/O: pneumonia   . High cholesterol   . Hypertension   . OSA (obstructive sleep apnea)    no cpap  . Tracheobronchitis 01/01/2012     Family History  Problem Relation Age of Onset  . Heart attack Mother   . Diabetes Maternal Grandfather   . Heart attack Maternal Grandfather   . Hypertension Maternal Grandfather   . Stroke Maternal Grandfather   . Heart attack Maternal Grandmother   . Hypertension Maternal Grandmother   . Alcohol abuse Father   . Thyroid disease Neg Hx   . Lung disease Neg Hx   . Colon cancer Neg Hx   . Breast cancer Neg Hx      Social History   Socioeconomic History  . Marital status: Divorced    Spouse name: Not on file  . Number of children: 3  . Years of education: GED  . Highest education level: Not on file  Occupational History  . Occupation: Disability  Social Needs  . Financial resource strain: Not on file  . Food insecurity:    Worry: Not  on file    Inability: Not on file  . Transportation needs:    Medical: Not on file    Non-medical: Not on file  Tobacco Use  . Smoking status: Former Smoker    Packs/day: 1.50    Years: 41.00    Pack years: 61.50    Types: Cigarettes    Start date: 03/19/1974    Last attempt to quit: 11/06/2017    Years since quitting: 0.4  . Smokeless tobacco: Never Used  . Tobacco comment: daily amount of cigarettes varies depending on stress level   Substance and Sexual Activity  . Alcohol use: No    Alcohol/week: 0.0 oz  . Drug use: No  . Sexual activity: Never    Birth control/protection: Post-menopausal  Lifestyle  . Physical activity:    Days per week: Not on file    Minutes per session: Not on file  . Stress: Not on file  Relationships  . Social connections:    Talks on  phone: Not on file    Gets together: Not on file    Attends religious service: Not on file    Active member of club or organization: Not on file    Attends meetings of clubs or organizations: Not on file    Relationship status: Not on file  . Intimate partner violence:    Fear of current or ex partner: Not on file    Emotionally abused: Not on file    Physically abused: Not on file    Forced sexual activity: Not on file  Other Topics Concern  . Not on file  Social History Narrative   Daughter Edmonia Lynch) [phone]   Originally from [place]. Previously lived in [place]. She has traveled from [facility] to [place] & has also traveled across country to [place]. Multiple visits to [place], [place], [place], [place], [place]. She has a dog at home. No bird, mold, or hot tub exposure. She reports some years ago she did live in a home that had black mold in her closet. 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       Pt lives with her youngest daughter, lives in a trailer, does have stairs into home. Does have some troubled with that. Highest level of education is GED.      Allergies  Allergen Reactions  . Aspartame And Phenylalanine Nausea And Vomiting    Patient says she is allergic to all artificial sweeteners  . Doxycycline Other (See Comments)    Nausea, vomiting, HA, double vision  . Tramadol Shortness Of Breath and Nausea Only  . Codeine Itching    Has not tried benadryl to alleviate side effects  . Neurontin [Gabapentin] Other (See Comments)    Dizzy, "drugged" feeling, sleepy  . Sulfa Antibiotics Other (See Comments)    Kidney problem   . Symbicort [Budesonide-Formoterol Fumarate]     Swelling of face and inside of mouth per pt  . Penicillins Rash    Has patient had a PCN reaction causing immediate rash, facial/tongue/throat swelling, SOB or lightheadedness with hypotension:  No Has patient had a PCN reaction causing severe rash involving mucus membranes or skin necrosis: No Has patient had a PCN reaction that required hospitalization No Has patient had a PCN reaction occurring within the last 10 years: No If all of the above answers are "NO", then may proceed with Cephalosporin  use.  . Prednisone Rash    Patient stated she received Prednisone while she was in the hospital and experienced a rash and "extreme pain.'     Outpatient Medications Prior to Visit  Medication Sig Dispense Refill  . albuterol (PROAIR HFA) 108 (90 Base) MCG/ACT inhaler Inhale 2 puffs into the lungs every 6 (six) hours as needed for wheezing or shortness of breath. 1 Inhaler 5  . amLODipine (NORVASC) 10 MG tablet Take 1 tablet (10 mg total) by mouth daily. 90 tablet 3  . blood glucose meter kit and supplies KIT Use to test blood sugar 1 each 0  . cyclobenzaprine (FLEXERIL) 5 MG tablet TAKE 1 TABLET(5 MG) BY MOUTH THREE TIMES DAILY AS NEEDED FOR MUSCLE SPASMS 30 tablet 0  . glucose blood (IGLUCOSE TEST STRIPS) test strip Use as instructed 100 each 0  . hydrALAZINE (APRESOLINE) 25 MG tablet Take 25 mg by mouth every 8 (eight) hours.    [place] Kitchen labetalol (NORMODYNE) 100 MG tablet Take 1 tablet (100 mg total) by mouth 2 (two) times daily. 180 tablet 3  . Lancets MISC Use to test blood sugar one daily 30 each 0  . metFORMIN (GLUCOPHAGE) 500 MG tablet TAKE 1 TABLET(500 MG) BY MOUTH TWICE DAILY WITH A MEAL 90 tablet 1  . pantoprazole (PROTONIX) 40 MG tablet Take 1 tablet (40 mg total) by mouth daily. 90 tablet 1  . tiotropium (SPIRIVA HANDIHALER) 18 MCG inhalation capsule Place 1 capsule (18 mcg total) into inhaler and inhale daily. 30 capsule 5  . albuterol (ACCUNEB) 0.63 MG/3ML nebulizer solution Take 1 ampule by nebulization every 6 (six) hours as needed for wheezing or shortness of breath.    . hydrALAZINE (APRESOLINE) 25 MG tablet Take 1 tablet (25 mg total) by mouth every 8 (eight) hours. 90 tablet  6   No facility-administered medications prior to visit.     Review of Systems  Constitutional: Positive for malaise/fatigue. Negative for chills and diaphoresis.  HENT: Negative for congestion and ear discharge.   Respiratory: Positive for cough, sputum production, shortness of breath and wheezing. Negative for stridor.   Cardiovascular: Positive for leg swelling. Negative for chest pain and orthopnea.      Objective:  Physical Exam   Vitals:   04/10/18 1613  BP: (!) 150/68  Bird: 80  SpO2: 94%  Weight: 171 lb (77.6 kg)  Height: _0  (1.626 m)   2L [place]  Gen: chronically ill appearing HENT: OP clear, TM's clear, neck supple PULM: Poor air movement, some crackles R base, increased AP diameter, normal percussion CV: RRR, no mgr, trace edema GI: BS+, soft, nontender Derm: no cyanosis or rash Psyche: normal mood and affect   CBC    Component Value Date/Time   WBC 14.4 (H) 11/20/2017 0509   RBC 4.45 11/20/2017 0509   HGB 13.0 11/20/2017 0509   HCT 40.4 11/20/2017 0509   PLT 181 11/20/2017 0509   MCV 90.8 11/20/2017 0509   MCH 29.2 11/20/2017 0509   MCHC 32.2 11/20/2017 0509   RDW 14.0 11/20/2017 0509   LYMPHSABS 2.5 03/12/2017 1548   MONOABS 0.8 03/12/2017 1548   EOSABS 0.3 03/12/2017 1548   BASOSABS 0.0 03/12/2017 1548     PFT 04/2018 Spirometry: Ratio 50%, FEV1 0.75L (34% pred) 01/28/16: FVC 2.31 L (77%) FEV1 1.16 L (81%) FEV1/FVC 0.50 FEF 25-75 0.47 L (24%) no bronchodilator response 06/09/15: FVC 2.21 L (73%) FEV1 1.08 L (47%) FEV1/FVC 0.49 FEF 25-75 0.43 L (22%) no bronchodilator  response TLC 5.26 L (105%) RV 135% ERV 87% DLCO corrected 36% (Hgb 16.2)  6MWT 07/30/17:  Walked 192 meters / Baseline Sat 97% on RA / Nadir Sat 93% on RA 10/20/15:  Walked 240 meters / Baseline Sat 94% on RA / Nadir Sat 90% on RA 06/22/15:  3 laps around office. Baseline 93% on RA. Nadir Sat 89% on RA. 06/18/15:  Walked in hospital by PT / Baseline Sat 90% on RA / Nadir Sat  86% on RA (required 3 L/m on exertion to maintain)  IMAGING February 2019 chest x-ray images independently reviewed showing smphysema and cardiomegaly CXR PA/LAT 03/19/16 :  No parenchymal mass or opacity appreciated. No pleural effusion. Heart normal in size & mediastinum normal in contour.   CXR PA/LAT 06/16/16 :  No focal opacity. No effusion. Normal heart size. Normal mediastinal contour.   CXR PA/LAT 10/08/14: Mild persistent linear opacity consistent with scar at the right lung base. Kyphosis noted. Hyperinflation with some flattening of the diaphragm. No pleural effusion or thickening appreciated. No parenchymal opacity or nodule appreciated. Heart normal in size. Mediastinum: Contour.  CT CHEST W/ 04/16/02: No evidence for pulmonary emboli. No pathologically enlarged mediastinal or hilar lymph nodes. Enlarged lower pole of left thyroid lobe with a nodular configuration. Mild water density alveolitis with an anterior portion of left upper lobe & medial anterior portion of right upper lobe. No parenchymal nodules.   CARDIAC TTE (01/08/13): LV normal in size. EF 60-65%. Normal wall motion without regional abnormality. LA & RA normal in size. RV normal in size and function. Pulmonary artery systolic pressure 43 mmHg. Trivial aortic regurgitation. No mitral stenosis or regurgitation. No pulmonic or tricuspid regurgitation. No pericardial effusion.  LABS 05/27/15 Alpha-1 antitrypsin: 164  04/29/15 BMP: 140/4.2/102/24/16/1.01/93/9 LFT: 4/6.7/0.7/67/14/13  10/09/14 VBG (on room air): 7.39/37/59  03/24/13 ANA: Negative  RF:  <7.0   Records from her hospitalization for a COPD exacerbation in February 2019 reviewed, she was discharged on Spiriva     Assessment & Plan:   Dyspnea, unspecified type - Plan: Spirometry with Graph, DG Chest 2 View, B Nat Peptide, Nicotine/cotinine metabolites  COPD, severe ([name])  Tobacco use disorder  OSA (obstructive sleep apnea)  Chronic respiratory  failure with hypoxia ([name])  Leg swelling  Discussion: Victoria Bird comes back to our clinic today for the first time in about 9 months to establish care for her COPD.  She recently had a severe exacerbation and says she quit smoking.  However, a bit puzzled because she smells of cigarettes today.  She says that she is taking Spiriva alone which considering the severity of her symptoms and the severe episode of COPD she had recently think she probably needs to be treated with something stronger.  She also thinks a fair amount of her symptoms are due to fluid retention and she does have evidence of volume overload on physical exam today.  Spirometry testing today showed a significant drop in her lung function compared to the last test which was performed in 2017.  Plan: Fluid retention/leg swelling: We will check a test called a brain natruretic peptide Start taking furosemide, 40 mg daily times the next 7 days When we see you back in 1 week we will review whether or not the swelling has improved and how to further use diuretics  Chronic respiratory failure with hypoxemia: For now continue using 2 L of oxygen with exertion We will check your oxygen while ambulating today  COPD with recent severe exacerbation: Stay  away from cigarettes Stop Spiriva Take Trelegy 1 puff daily for the next week, we will give you a sample today and review how you did with this medicine when you come back to decide if we should prescribe it or not Get a flu shot in the fall Stay active Practice good hand hygiene Spirometry test today  Shortness of breath: We will check a chest x-ray today  Follow up with one of our nurse practitioners in one week  > 50% of this 40 minute visit spent face to face    Current Outpatient Medications:  .  albuterol (PROAIR HFA) 108 (90 Base) MCG/ACT inhaler, Inhale 2 puffs into the lungs every 6 (six) hours as needed for wheezing or shortness of breath., Disp: 1 Inhaler, Rfl: 5 .   amLODipine (NORVASC) 10 MG tablet, Take 1 tablet (10 mg total) by mouth daily., Disp: 90 tablet, Rfl: 3 .  blood glucose meter kit and supplies KIT, Use to test blood sugar, Disp: 1 each, Rfl: 0 .  cyclobenzaprine (FLEXERIL) 5 MG tablet, TAKE 1 TABLET(5 MG) BY MOUTH THREE TIMES DAILY AS NEEDED FOR MUSCLE SPASMS, Disp: 30 tablet, Rfl: 0 .  glucose blood (IGLUCOSE TEST STRIPS) test strip, Use as instructed, Disp: 100 each, Rfl: 0 .  hydrALAZINE (APRESOLINE) 25 MG tablet, Take 25 mg by mouth every 8 (eight) hours., Disp: , Rfl:  .  labetalol (NORMODYNE) 100 MG tablet, Take 1 tablet (100 mg total) by mouth 2 (two) times daily., Disp: 180 tablet, Rfl: 3 .  Lancets MISC, Use to test blood sugar one daily, Disp: 30 each, Rfl: 0 .  metFORMIN (GLUCOPHAGE) 500 MG tablet, TAKE 1 TABLET(500 MG) BY MOUTH TWICE DAILY WITH A MEAL, Disp: 90 tablet, Rfl: 1 .  pantoprazole (PROTONIX) 40 MG tablet, Take 1 tablet (40 mg total) by mouth daily., Disp: 90 tablet, Rfl: 1 .  tiotropium (SPIRIVA HANDIHALER) 18 MCG inhalation capsule, Place 1 capsule (18 mcg total) into inhaler and inhale daily., Disp: 30 capsule, Rfl: 5 .  albuterol (ACCUNEB) 0.63 MG/3ML nebulizer solution, Take 1 ampule by nebulization every 6 (six) hours as needed for wheezing or shortness of breath., Disp: , Rfl:  .  Fluticasone-Umeclidin-Vilant (TRELEGY ELLIPTA) 100-62.5-25 MCG/INH AEPB, Inhale 1 puff into the lungs daily., Disp: 1 each, Rfl: 0 .  furosemide (LASIX) 40 MG tablet, Take 1 tablet (40 mg total) by mouth daily for 7 days., Disp: 7 tablet, Rfl: 0 .  hydrALAZINE (APRESOLINE) 25 MG tablet, Take 1 tablet (25 mg total) by mouth every 8 (eight) hours., Disp: 90 tablet, Rfl: 6

## 2018-04-10 NOTE — Patient Instructions (Signed)
Plan: Fluid retention/leg swelling: We will check a test called a brain natruretic peptide Start taking furosemide, 40 mg daily times the next 7 days When we see you back in 1 week we will review whether or not the swelling has improved and how to further use diuretics  Chronic respiratory failure with hypoxemia: For now continue using 2 L of oxygen with exertion We will check your oxygen while ambulating today  COPD with recent severe exacerbation: Stay away from cigarettes Stop Spiriva Take Trelegy 1 puff daily for the next week, we will give you a sample today and review how you did with this medicine when you come back to decide if we should prescribe it or not Get a flu shot in the fall Stay active Practice good hand hygiene Spirometry test today  Shortness of breath: We will check a chest x-ray today  Follow up with one of our nurse practitioners in one week

## 2018-04-11 ENCOUNTER — Telehealth: Payer: Self-pay | Admitting: Pulmonary Disease

## 2018-04-11 ENCOUNTER — Other Ambulatory Visit: Payer: Self-pay | Admitting: Pulmonary Disease

## 2018-04-11 DIAGNOSIS — R918 Other nonspecific abnormal finding of lung field: Secondary | ICD-10-CM

## 2018-04-11 LAB — BRAIN NATRIURETIC PEPTIDE: PRO B NATRI PEPTIDE: 37 pg/mL (ref 0.0–100.0)

## 2018-04-11 NOTE — Telephone Encounter (Signed)
Victoria Bird is schedule for CT on 04/15/2018 @ 4:00pm at [facility] and she is aware of this appt and location

## 2018-04-11 NOTE — Telephone Encounter (Signed)
Called and spoke to patient. Patient stated after talking with me about results she had left the room and when she returned she had a missed call. Patient did not know if she have a voicemail or not, she just called [place] back.   Will route to [place] since she called the patient to follow up.

## 2018-04-13 LAB — NICOTINE/COTININE METABOLITES
Cotinine: 4.4 ng/mL
Nicotine: NOT DETECTED ng/mL

## 2018-04-15 ENCOUNTER — Ambulatory Visit (INDEPENDENT_AMBULATORY_CARE_PROVIDER_SITE_OTHER)
Admission: RE | Admit: 2018-04-15 | Discharge: 2018-04-15 | Disposition: A | Discharge status: Home / Self Care | Payer: Medicare Other | Source: Ambulatory Visit | Attending: Pulmonary Disease | Admitting: Pulmonary Disease

## 2018-04-15 ENCOUNTER — Telehealth: Payer: Self-pay | Admitting: Internal Medicine

## 2018-04-15 DIAGNOSIS — R918 Other nonspecific abnormal finding of lung field: Secondary | ICD-10-CM

## 2018-04-15 NOTE — Telephone Encounter (Signed)
Pharmacy called [facility] 7/14 at 2:41pm asking if patient needs to be on Cholesterol meds and stating that patient is diabetic.

## 2018-04-15 NOTE — Telephone Encounter (Signed)
If provider wanted patient to be on cholesterol medication patient would be on it. Patient can discuss at next office visit if they feel they should be on one

## 2018-04-18 ENCOUNTER — Ambulatory Visit (INDEPENDENT_AMBULATORY_CARE_PROVIDER_SITE_OTHER): Payer: Medicare Other | Admitting: Adult Health

## 2018-04-18 ENCOUNTER — Encounter: Payer: Self-pay | Admitting: Adult Health

## 2018-04-18 ENCOUNTER — Other Ambulatory Visit (INDEPENDENT_AMBULATORY_CARE_PROVIDER_SITE_OTHER): Payer: Medicare Other

## 2018-04-18 VITALS — BP 110/76 | HR 76 | Ht 63.0 in | Wt 170.0 lb

## 2018-04-18 DIAGNOSIS — I5032 Chronic diastolic (congestive) heart failure: Secondary | ICD-10-CM | POA: Diagnosis not present

## 2018-04-18 DIAGNOSIS — R918 Other nonspecific abnormal finding of lung field: Secondary | ICD-10-CM

## 2018-04-18 DIAGNOSIS — J9611 Chronic respiratory failure with hypoxia: Secondary | ICD-10-CM | POA: Diagnosis not present

## 2018-04-18 DIAGNOSIS — N183 Chronic kidney disease, stage 3 unspecified: Secondary | ICD-10-CM

## 2018-04-18 DIAGNOSIS — I5030 Unspecified diastolic (congestive) heart failure: Secondary | ICD-10-CM | POA: Insufficient documentation

## 2018-04-18 DIAGNOSIS — J449 Chronic obstructive pulmonary disease, unspecified: Secondary | ICD-10-CM

## 2018-04-18 DIAGNOSIS — R0600 Dyspnea, unspecified: Secondary | ICD-10-CM

## 2018-04-18 LAB — BASIC METABOLIC PANEL
BUN: 28 mg/dL — AB (ref 6–23)
CALCIUM: 9.1 mg/dL (ref 8.4–10.5)
CO2: 28 meq/L (ref 19–32)
CREATININE: 1.89 mg/dL — AB (ref 0.40–1.20)
Chloride: 102 mEq/L (ref 96–112)
GFR: 27.86 mL/min — AB (ref >60.00)
GLUCOSE: 96 mg/dL (ref 70–99)
Potassium: 4.6 mEq/L (ref 3.5–5.1)
SODIUM: 137 meq/L (ref 135–145)

## 2018-04-18 NOTE — Progress Notes (Signed)
'@Patient'  ID: Victoria Bird, female    DOB: March 11, 1946, 72 y.o.   MRN: [id number]  Chief Complaint  Patient presents with  . Follow-up    COPD    Referring provider: Hoyt Koch, *  HPI: 72 year old female former smoker followed for severe COPD   PFT 04/2018 Spirometry: Ratio 50%, FEV1 0.75L (34% pred) 01/28/16: FVC 2.31 L (77%) FEV1 1.16 L (81%) FEV1/FVC 0.50 FEF 25-75 0.47 L (24%) no bronchodilator response 06/09/15: FVC 2.21 L (73%) FEV1 1.08 L (47%) FEV1/FVC 0.49 FEF 25-75 0.43 L (22%) no bronchodilator response TLC 5.26 L (105%) RV 135% ERV 87% DLCO corrected 36% (Hgb 16.2)  6MWT 07/30/17: Walked 192 meters / Baseline Sat 97% on RA / Nadir Sat 93% on RA 10/20/15: Walked 240 meters / Baseline Sat 94% on RA / Nadir Sat 90% on RA 06/22/15: 3 laps around office. Baseline 93% on RA. Nadir Sat 89% on RA. 06/18/15: Walked in hospital by PT / Baseline Sat 90% on RA / Nadir Sat 86% on RA (required 3 L/m on exertion to maintain)  IMAGING February 2019 chest x-ray images independently reviewed showing smphysema and cardiomegaly CXR PA/LAT 03/19/16 : No parenchymal mass or opacity appreciated. No pleural effusion. Heart normal in size &mediastinum normal in contour.   CXR PA/LAT 06/16/16 :No focal opacity. No effusion. Normal heart size. Normal mediastinal contour.   CXR PA/LAT 10/08/14: Mild persistent linear opacity consistent with scar at the right lung base. Kyphosis noted. Hyperinflation with some flattening of the diaphragm. No pleural effusion or thickening appreciated. No parenchymal opacity or nodule appreciated. Heart normal in size. Mediastinum: Contour.  CT CHEST W/ 04/16/02:No evidence for pulmonary emboli. No pathologically enlarged mediastinal or hilar lymph nodes. Enlarged lower pole of left thyroid lobe with a nodular configuration. Mild water density alveolitis with an anterior portion of left upper lobe &medial anterior portion of right upper  lobe. No parenchymal nodules.   CARDIAC TTE (01/08/13): LV normal in size. EF 60-65%. Normal wall motion without regional abnormality. LA & RA normal in size. RV normal in size and function. Pulmonary artery systolic pressure 43 mmHg. Trivial aortic regurgitation. No mitral stenosis or regurgitation. No pulmonic or tricuspid regurgitation. No pericardial effusion.  LABS 05/27/15 Alpha-1 antitrypsin: 164  04/29/15 BMP: 140/4.2/102/24/16/1.01/93/9 LFT: 4/6.7/0.7/67/14/13  10/09/14 VBG (on room air): 7.39/37/59  03/24/13 ANA: Negative  RF: <7.0  04/18/2018 Follow up : COPD , O2 RF  Patient presents for a one-week follow-up.  Patient has underlying severe COPD.  Was seen last visit with increased symptom burden.  She was changed from Spiriva to Trelegy and started on Lasix 40 mg for suspected mild volume overload.  Patient says she is feeling some better.  Her swelling has resolved.  And does feel that her breathing is slightly better.  She says Trelegy is okay does not necessarily like the taste but can tolerate it.  Chest x-ray showed a possible 6 mm upper lobe nodule.  Patient was set up for a CT chest that showed stable pulmonary nodules over the last 2 years.  Along with moderate emphysema.  Atherosclerosis and stable 4.3 cm thoracic aortic aneurysm was noted.  Patient is aware to follow-up PCP to discuss these results. Patient denies any chest pain orthopnea PND or hemoptysis. Says she likes the lasix but feels it is too strong. Would lke lower dose.   Allergies  Allergen Reactions  . Aspartame And Phenylalanine Nausea And Vomiting    Patient says she is allergic  to all artificial sweeteners  . Doxycycline Other (See Comments)    Nausea, vomiting, HA, double vision  . Tramadol Shortness Of Breath and Nausea Only  . Codeine Itching    Has not tried benadryl to alleviate side effects  . Neurontin [Gabapentin] Other (See Comments)    Dizzy, "drugged" feeling, sleepy  . Sulfa  Antibiotics Other (See Comments)    Kidney problem   . Symbicort [Budesonide-Formoterol Fumarate]     Swelling of face and inside of mouth per pt  . Penicillins Rash    Has patient had a PCN reaction causing immediate rash, facial/tongue/throat swelling, SOB or lightheadedness with hypotension: No Has patient had a PCN reaction causing severe rash involving mucus membranes or skin necrosis: No Has patient had a PCN reaction that required hospitalization No Has patient had a PCN reaction occurring within the last 10 years: No If all of the above answers are "NO", then may proceed with Cephalosporin use.  . Prednisone Rash    Patient stated she received Prednisone while she was in the hospital and experienced a rash and "extreme pain.'    Immunization History  Administered Date(s) Administered  . Influenza Split 12/15/2011  . Influenza, High Dose Seasonal PF 07/28/2015, 07/19/2016, 07/19/2017  . Influenza,inj,Quad PF,6+ Mos 10/12/2014  . Pneumococcal Conjugate-13 04/13/2017  . Pneumococcal Polysaccharide-23 12/15/2011  . Pneumococcal-Unspecified 01/25/2016  . Tdap 01/25/2016    Past Medical History:  Diagnosis Date  . Anxiety   . Chronic pain   . Colon polyps    adenomatous  . COPD (chronic obstructive pulmonary disease) ([place])   . Diabetes mellitus   . Diverticulitis   . Diverticulosis   . Emphysema lung ([place])   . Gall stones   . GERD (gastroesophageal reflux disease)   . H/O: pneumonia   . High cholesterol   . Hypertension   . OSA (obstructive sleep apnea)    no cpap  . Tracheobronchitis 01/01/2012    Tobacco History: Social History   Tobacco Use  Smoking Status Former Smoker  . Packs/day: 1.50  . Years: 41.00  . Pack years: 61.50  . Types: Cigarettes  . Start date: 03/19/1974  . Last attempt to quit: 11/06/2017  . Years since quitting: 0.4  Smokeless Tobacco Never Used  Tobacco Comment   daily amount of cigarettes varies depending on stress level    Counseling  given: Not Answered Comment: daily amount of cigarettes varies depending on stress level    Outpatient Medications Prior to Visit  Medication Sig Dispense Refill  . albuterol (ACCUNEB) 0.63 MG/3ML nebulizer solution Take 1 ampule by nebulization every 6 (six) hours as needed for wheezing or shortness of breath.    Marland Kitchen albuterol (PROAIR HFA) 108 (90 Base) MCG/ACT inhaler Inhale 2 puffs into the lungs every 6 (six) hours as needed for wheezing or shortness of breath. 1 Inhaler 5  . amLODipine (NORVASC) 10 MG tablet Take 1 tablet (10 mg total) by mouth daily. 90 tablet 3  . blood glucose meter kit and supplies KIT Use to test blood sugar 1 each 0  . cyclobenzaprine (FLEXERIL) 5 MG tablet TAKE 1 TABLET(5 MG) BY MOUTH THREE TIMES DAILY AS NEEDED FOR MUSCLE SPASMS 30 tablet 0  . Fluticasone-Umeclidin-Vilant (TRELEGY ELLIPTA) 100-62.5-25 MCG/INH AEPB Inhale 1 puff into the lungs daily. 1 each 0  . glucose blood (IGLUCOSE TEST STRIPS) test strip Use as instructed 100 each 0  . hydrALAZINE (APRESOLINE) 25 MG tablet Take 25 mg by mouth every 8 (eight)  hours.    . labetalol (NORMODYNE) 100 MG tablet Take 1 tablet (100 mg total) by mouth 2 (two) times daily. 180 tablet 3  . Lancets MISC Use to test blood sugar one daily 30 each 0  . metFORMIN (GLUCOPHAGE) 500 MG tablet TAKE 1 TABLET(500 MG) BY MOUTH TWICE DAILY WITH A MEAL 90 tablet 1  . pantoprazole (PROTONIX) 40 MG tablet Take 1 tablet (40 mg total) by mouth daily. 90 tablet 1  . furosemide (LASIX) 40 MG tablet Take 1 tablet (40 mg total) by mouth daily for 7 days. 7 tablet 0  . hydrALAZINE (APRESOLINE) 25 MG tablet Take 1 tablet (25 mg total) by mouth every 8 (eight) hours. 90 tablet 6  . tiotropium (SPIRIVA HANDIHALER) 18 MCG inhalation capsule Place 1 capsule (18 mcg total) into inhaler and inhale daily. (Patient not taking: Reported on 04/18/2018) 30 capsule 5   No facility-administered medications prior to visit.      Review of  Systems  Constitutional:   No  weight loss, night sweats,  Fevers, chills,  +fatigue, or  lassitude.  HEENT:   No headaches,  Difficulty swallowing,  Tooth/dental problems, or  Sore throat,                No sneezing, itching, ear ache, nasal congestion, post nasal drip,   CV:  No chest pain,  Orthopnea, PND, swelling in lower extremities, anasarca, dizziness, palpitations, syncope.   GI  No heartburn, indigestion, abdominal pain, nausea, vomiting, diarrhea, change in bowel habits, loss of appetite, bloody stools.   Resp:  No chest wall deformity  Skin: no rash or lesions.  GU: no dysuria, change in color of urine, no urgency or frequency.  No flank pain, no hematuria   MS:  No joint pain or swelling.  No decreased range of motion.  No back pain.    Physical Exam  BP 110/76 (BP Location: Left Arm, Cuff Size: Normal)   Pulse 76   Ht '5\' 3"'  (1.6 m)   Wt 170 lb (77.1 kg)   SpO2 94%   BMI 30.11 kg/m   GEN: A/Ox3; pleasant , NAD, obese    HEENT:  [place]/AT,  EACs-clear, TMs-wnl, NOSE-clear, THROAT-clear, no lesions, no postnasal drip or exudate noted.   NECK:  Supple w/ fair ROM; no JVD; normal carotid impulses w/o bruits; no thyromegaly or nodules palpated; no lymphadenopathy.    RESP  Decreased BS in bases  no accessory muscle use, no dullness to percussion  CARD:  RRR, no m/r/g, no peripheral edema, pulses intact, no cyanosis or clubbing.  GI:   Soft & nt; nml bowel sounds; no organomegaly or masses detected.   Musco: Warm bil, no deformities or joint swelling noted.   Neuro: alert, no focal deficits noted.    Skin: Warm, no lesions or rashes    Lab Results:    BNP No results found for: BNP   Imaging: Dg Chest 2 View  Result Date: 04/11/2018 CLINICAL DATA:  Chronic dyspnea, COPD, diabetes mellitus, hypertension, emphysema, former smoker EXAM: CHEST - 2 VIEW COMPARISON:  11/10/2017 FINDINGS: Normal heart size, mediastinal contours, and pulmonary vascularity.  Emphysematous and minimal bronchitic changes consistent with COPD. No acute infiltrate, pleural effusion, or pneumothorax. Questionable new 6 mm diameter nodular density LEFT lung base adjacent to the cardiac apex not seen on previous 3 exams. Bones demineralized. IMPRESSION: COPD changes with question new 6 mm LEFT basilar nodular density; CT chest recommended to exclude pulmonary nodule. Electronically Signed  By: Lavonia Dana M.D.   On: 04/11/2018 08:52   Ct Chest Wo Contrast  Result Date: 04/15/2018 CLINICAL DATA:  Follow-up pulmonary nodules EXAM: CT CHEST WITHOUT CONTRAST TECHNIQUE: Multidetector CT imaging of the chest was performed following the standard protocol without IV contrast. COMPARISON:  04/10/2018 chest radiograph. 06/08/2016 chest CT angiogram FINDINGS: Cardiovascular: Normal heart size. No significant pericardial effusion/thickening. Left main and three-vessel coronary atherosclerosis. Atherosclerotic thoracic aorta with stable ectatic 4.3 cm proximal descending thoracic aorta using similar measurement technique. Stable dilated main pulmonary artery (3.5 cm diameter). Mediastinum/Nodes: Stable partially calcified dominant 4.4 cm left thyroid lobe nodule. Unremarkable esophagus. No pathologically enlarged axillary, mediastinal or hilar lymph nodes, noting limited sensitivity for the detection of hilar adenopathy on this noncontrast study. Lungs/Pleura: No pneumothorax. No pleural effusion. Moderate centrilobular emphysema with mild diffuse bronchial wall thickening. Numerous (greater than 10) small solid pulmonary nodules scattered in both lungs measuring up to 7 mm in the right middle lobe (series 3/image 70) and 6 mm in the anterior left lower lobe (series 3/image 105) are all unchanged since 06/08/2016 chest CT using similar measurement technique. No acute consolidative airspace disease, lung masses or new significant pulmonary nodules. Parenchymal band in medial right middle lobe is  compatible with postinfectious/postinflammatory scarring. Upper abdomen: No acute abnormality. Cholecystectomy. Musculoskeletal: No aggressive appearing focal osseous lesions. Mild thoracic spondylosis. IMPRESSION: 1. Long-term stability of small solid pulmonary nodules scattered in both lungs back to 06/08/2016 chest CT, considered benign. 2. Moderate centrilobular emphysema with mild diffuse bronchial wall thickening, suggesting COPD. 3. Stable ectatic 4.3 cm proximal descending thoracic aorta. Suggest follow-up chest CT angiogram in 12 months. 4. Left main and 3 vessel coronary atherosclerosis. 5. Stable dilated main pulmonary artery, suggesting pulmonary arterial hypertension. Aortic Atherosclerosis (ICD10-I70.0) and Emphysema (ICD10-J43.9). Electronically Signed   By: Ilona Sorrel M.D.   On: 04/15/2018 16:21     Assessment & Plan:   COPD (chronic obstructive pulmonary disease) ([name]) Improved symptom control on Trelegy.  Plan  Patient Instructions  Decrease Lasix 22m daily .  Labs today  Continue on TRELEGY 1 puff daily . Rinse after use.  Follow up with Dr. MLake Bellsin 3 months and As needed   Please contact office for sooner follow up if symptoms do not improve or worsen or seek emergency care       CKD (chronic kidney disease), stage III ([place] Check bmet on lasix    Lung nodules Stable on CT chest x 2 yrs .   Chronic respiratory failure with hypoxia ([name]) Cont on O2 to keep sats >90%.   Diastolic CHF ([place] Suspected DCHF with recent mild decompensation, improved on lasix  Lower dose for improved tolerance.  Check bmet       TRexene Edison NP 04/18/2018

## 2018-04-18 NOTE — Assessment & Plan Note (Signed)
Cont on O2 to keep sats >90%.

## 2018-04-18 NOTE — Addendum Note (Signed)
Addended by: Parke Poisson E on: 04/18/2018 04:23 PM   Modules accepted: Orders

## 2018-04-18 NOTE — Patient Instructions (Addendum)
Decrease Lasix 20mg  daily .  Labs today  Continue on TRELEGY 1 puff daily . Rinse after use.  Follow up with Dr. Lake Bells in 3 months and As needed   Please contact office for sooner follow up if symptoms do not improve or worsen or seek emergency care

## 2018-04-18 NOTE — Assessment & Plan Note (Signed)
Check bmet on lasix

## 2018-04-18 NOTE — Assessment & Plan Note (Signed)
Suspected DCHF with recent mild decompensation, improved on lasix  Lower dose for improved tolerance.  Check bmet

## 2018-04-18 NOTE — Assessment & Plan Note (Signed)
Stable on CT chest x 2 yrs .

## 2018-04-18 NOTE — Assessment & Plan Note (Signed)
Improved symptom control on Trelegy.  Plan  Patient Instructions  Decrease Lasix 20mg  daily .  Labs today  Continue on TRELEGY 1 puff daily . Rinse after use.  Follow up with Dr. [place] Bells in 3 months and As needed   Please contact office for sooner follow up if symptoms do not improve or worsen or seek emergency care

## 2018-04-19 DIAGNOSIS — J449 Chronic obstructive pulmonary disease, unspecified: Secondary | ICD-10-CM | POA: Diagnosis not present

## 2018-04-19 MED ORDER — FLUTICASONE-UMECLIDIN-VILANT 100-62.5-25 MCG/INH IN AEPB: 1.0000 | INHALATION_SPRAY | Freq: Every day | RESPIRATORY_TRACT | 3 refills | Status: DC

## 2018-04-19 NOTE — Addendum Note (Signed)
Addended by: Parke Poisson E on: 04/19/2018 11:04 AM   Modules accepted: Orders

## 2018-04-19 NOTE — Progress Notes (Signed)
Reviewed, agree 

## 2018-04-24 ENCOUNTER — Other Ambulatory Visit: Payer: Self-pay | Admitting: Internal Medicine

## 2018-05-20 DIAGNOSIS — J449 Chronic obstructive pulmonary disease, unspecified: Secondary | ICD-10-CM | POA: Diagnosis not present

## 2018-05-23 ENCOUNTER — Other Ambulatory Visit: Payer: Self-pay | Admitting: Internal Medicine

## 2018-05-27 ENCOUNTER — Other Ambulatory Visit: Payer: Self-pay | Admitting: Internal Medicine

## 2018-05-27 ENCOUNTER — Telehealth: Payer: Self-pay | Admitting: Adult Health

## 2018-05-27 DIAGNOSIS — E0800 Diabetes mellitus due to underlying condition with hyperosmolarity without nonketotic hyperglycemic-hyperosmolar coma (NKHHC): Secondary | ICD-10-CM

## 2018-05-27 MED ORDER — FUROSEMIDE 20 MG PO TABS: 20.0000 mg | ORAL_TABLET | Freq: Every day | ORAL | 3 refills | Status: AC

## 2018-05-27 NOTE — Telephone Encounter (Signed)
Spoke with pt, advised her that I would send in the 20mg  Lasix as TP instructed from the last visit. Pt understood and Rx sent to [facility] in [place].   Patient Instructions by Melvenia Needles, NP at 04/18/2018 3:00 PM  Author: Melvenia Needles, NP Author Type: Nurse Practitioner Filed: 04/18/2018 3:53 PM  Note Status: Addendum Cosign: Cosign Not Required Encounter Date: 04/18/2018  Editor: Melvenia Needles, NP (Nurse Practitioner)  Prior Versions: 1. Parrett, Fonnie Mu, NP (Nurse Practitioner) at 04/18/2018 3:45 PM - Signed    Decrease Lasix 20mg  daily .  Labs today  Continue on TRELEGY 1 puff daily . Rinse after use.  Follow up with Dr. Lake Bells in 3 months and As needed   Please contact office for sooner follow up if symptoms do not improve or worsen or seek emergency care

## 2018-06-17 IMAGING — DX DG CHEST 1V PORT
1 series · 1 of 1 positions shown · non-contrast
Comparison: June 07, 2016

CLINICAL DATA: Shortness of breath

EXAM:
PORTABLE CHEST 1 VIEW

[chest ap]
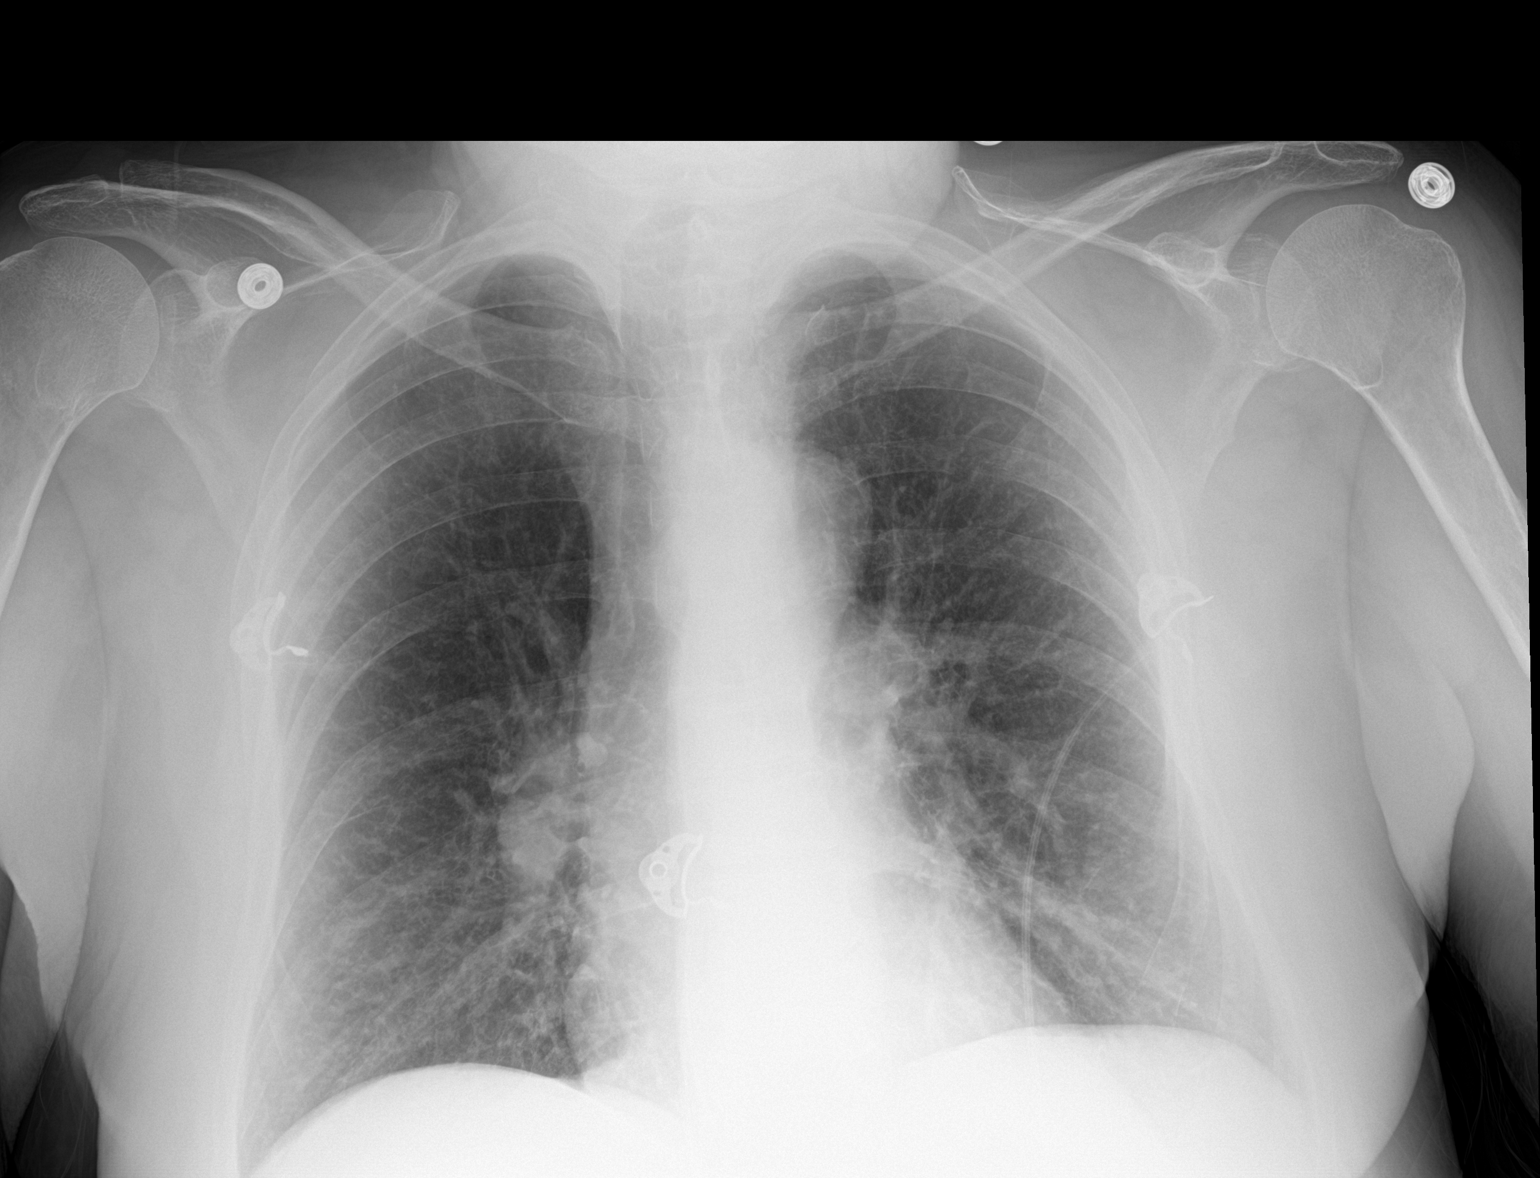

[1 of 1 positions shown; findings below may reference images not displayed]

FINDINGS: The heart size and mediastinal contours are within normal limits.
Both lungs are clear. The visualized skeletal structures are
unremarkable.
IMPRESSION: No active disease.

## 2018-06-18 ENCOUNTER — Other Ambulatory Visit: Payer: Self-pay | Admitting: Internal Medicine

## 2018-06-20 DIAGNOSIS — J449 Chronic obstructive pulmonary disease, unspecified: Secondary | ICD-10-CM | POA: Diagnosis not present

## 2018-07-04 ENCOUNTER — Other Ambulatory Visit: Payer: Self-pay | Admitting: Internal Medicine

## 2018-07-08 ENCOUNTER — Other Ambulatory Visit: Payer: Self-pay | Admitting: Internal Medicine

## 2018-07-08 DIAGNOSIS — E0800 Diabetes mellitus due to underlying condition with hyperosmolarity without nonketotic hyperglycemic-hyperosmolar coma (NKHHC): Secondary | ICD-10-CM

## 2018-07-20 DIAGNOSIS — J449 Chronic obstructive pulmonary disease, unspecified: Secondary | ICD-10-CM | POA: Diagnosis not present

## 2018-07-22 NOTE — Progress Notes (Signed)
Subjective:   Victoria Bird is a 72 y.o. female who presents for Medicare Annual (Subsequent) preventive examination.  Review of Systems:  No ROS.  Medicare Wellness Visit. Additional risk factors are reflected in the social history.  Cardiac Risk Factors include: advanced age (>24mn, >>79women);diabetes mellitus;dyslipidemia;hypertension Sleep patterns: gets up 1-2 times nightly to void and sleeps 8-9 hours nightly.    Home Safety/Smoke Alarms: Feels safe in home. Smoke alarms in place.  Living environment; residence and Firearm Safety: 1-story house/ trailer, equipment: Walkers, Type: RConservation officer, natureand HOmnicom Type: Commode, no firearms. Lives with family, no needs for DME, good support system Seat Belt Safety/Bike Helmet: Wears seat belt.     Objective:     Vitals: BP (!) 146/62   Pulse 79   Resp 17   Ht '5\' 3"'  (1.6 m)   Wt 177 lb (80.3 kg)   SpO2 94%   BMI 31.35 kg/m   Body mass index is 31.35 kg/m.  Advanced Directives 07/23/2018 11/08/2017 11/07/2017 07/19/2017 03/12/2017 03/03/2017 03/02/2017  Does Patient Have a Medical Advance Directive? No No No No No No No  Would patient like information on creating a medical advance directive? No - Patient declined No - Patient declined - No - Patient declined No - Patient declined No - Patient declined -  Pre-existing out of facility DNR order (yellow form or pink MOST form) - - - - - - -    Tobacco Social History   Tobacco Use  Smoking Status Former Smoker  . Packs/day: 1.50  . Years: 41.00  . Pack years: 61.50  . Types: Cigarettes  . Start date: 03/19/1974  . Last attempt to quit: 11/06/2017  . Years since quitting: 0.7  Smokeless Tobacco Never Used  Tobacco Comment   daily amount of cigarettes varies depending on stress level      Counseling given: Not Answered Comment: daily amount of cigarettes varies depending on stress level   Past Medical History:  Diagnosis Date  . Anxiety   . Chronic pain   . Colon  polyps    adenomatous  . COPD [place] obstructive [place] disease) ([place]   . Diabetes mellitus   . Diverticulitis   . Diverticulosis   . Emphysema lung ([facility]   . Gall stones   . GERD (gastroesophageal reflux disease)   . H/O: pneumonia   . High cholesterol   . Hypertension   . OSA (obstructive sleep apnea)    no cpap  . Tracheobronchitis 01/01/2012   Past Surgical History:  Procedure Laterality Date  . ABDOMINAL HYSTERECTOMY    . APPENDECTOMY    . Bilateral shoulder surgery    . CARPAL TUNNEL RELEASE Left   . CATARACT EXTRACTION    . CHOLECYSTECTOMY    . COLONOSCOPY    . ESOPHAGOGASTRODUODENOSCOPY    . FRACTURE SURGERY    . Right knee surgery    . ROTATOR CUFF REPAIR     Family History  Problem Relation Age of Onset  . Heart attack Mother   . Diabetes Maternal Grandfather   . Heart attack Maternal Grandfather   . Hypertension Maternal Grandfather   . Stroke Maternal Grandfather   . Heart attack Maternal Grandmother   . Hypertension Maternal Grandmother   . Alcohol abuse Father   . Thyroid disease Neg Hx   . Lung disease Neg Hx   . Colon cancer Neg Hx   . Breast cancer Neg Hx    Social History  Socioeconomic History  . Marital status: Divorced    Spouse name: Not on file  . Number of children: 3  . Years of education: GED  . Highest education level: Not on file  Occupational History  . Occupation: Disability  Social Needs  . Financial resource strain: Not hard at all  . Food insecurity:    Worry: Never true    Inability: Never true  . Transportation needs:    Medical: No    Non-medical: No  Tobacco Use  . Smoking status: Former Smoker    Packs/day: 1.50    Years: 41.00    Pack years: 61.50    Types: Cigarettes    Start date: 03/19/1974    Last attempt to quit: 11/06/2017    Years since quitting: 0.7  . Smokeless tobacco: Never Used  . Tobacco comment: daily amount of cigarettes varies depending on stress level   Substance and Sexual Activity  .  Alcohol use: No    Alcohol/week: 0.0 standard drinks  . Drug use: No  . Sexual activity: Never    Birth control/protection: Post-menopausal  Lifestyle  . Physical activity:    Days per week: 0 days    Minutes per session: 0 min  . Stress: Not at all  Relationships  . Social connections:    Talks on phone: More than three times a week    Gets together: More than three times a week    Attends religious service: 1 to 4 times per year    Active member of club or organization: Yes    Attends meetings of clubs or organizations: 1 to 4 times per year    Relationship status: Not on file  Other Topics Concern  . Not on file  Social History Narrative   Daughter Edmonia Lynch) [phone]   Originally from [place]. Previously lived in [place]. She has traveled from [facility] to [place] & has also traveled across country to [place]. Multiple visits to [place], [place], [place], [place], [place]. She has a dog at home. No bird, mold, or hot tub exposure. She reports some years ago she did live in a home that had black mold in her closet. 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       Pt lives with her youngest daughter, lives in a trailer, does have stairs into home. Does have some troubled with that. Highest level of education is GED.     Outpatient Encounter Medications as of 07/23/2018  Medication Sig  . albuterol (ACCUNEB) 0.63 MG/3ML nebulizer solution Take 1 ampule by nebulization every 6 (six) hours as needed for wheezing or shortness of breath.  Marland Kitchen albuterol (PROAIR HFA) 108 (90 Base) MCG/ACT inhaler Inhale 2 puffs into the lungs every 6 (six) hours as needed for wheezing or shortness of breath.  Marland Kitchen amLODipine (NORVASC) 10 MG tablet Take 1 tablet (10 mg total) by mouth daily.  . blood glucose meter kit and supplies KIT Use to test blood sugar  . cyclobenzaprine (FLEXERIL) 5 MG tablet TAKE 1  TABLET(5 MG) BY MOUTH THREE TIMES DAILY AS NEEDED FOR MUSCLE SPASMS  . furosemide (LASIX) 20 MG tablet Take 1 tablet (20 mg total) by mouth daily.  Marland Kitchen glucose blood (IGLUCOSE TEST STRIPS) test strip Use as instructed  . hydrALAZINE (APRESOLINE) 25 MG tablet Take 25 mg by mouth every 8 (eight) hours.  Marland Kitchen labetalol (  NORMODYNE) 100 MG tablet Take 1 tablet (100 mg total) by mouth 2 (two) times daily.  . Lancets MISC Use to test blood sugar one daily  . metFORMIN (GLUCOPHAGE) 500 MG tablet TAKE 1 TABLET(500 MG) BY MOUTH TWICE DAILY WITH A MEAL  . pantoprazole (PROTONIX) 40 MG tablet TAKE 1 TABLET(40 MG) BY MOUTH DAILY  . tiotropium (SPIRIVA HANDIHALER) 18 MCG inhalation capsule Place 1 capsule (18 mcg total) into inhaler and inhale daily.  . [DISCONTINUED] Fluticasone-Umeclidin-Vilant (TRELEGY ELLIPTA) 100-62.5-25 MCG/INH AEPB Inhale 1 puff into the lungs daily.  . furosemide (LASIX) 40 MG tablet Take 1 tablet (40 mg total) by mouth daily for 7 days.  . hydrALAZINE (APRESOLINE) 25 MG tablet Take 1 tablet (25 mg total) by mouth every 8 (eight) hours.  . [DISCONTINUED] Fluticasone-Umeclidin-Vilant (TRELEGY ELLIPTA) 100-62.5-25 MCG/INH AEPB Inhale 1 puff into the lungs daily. (Patient not taking: Reported on 07/23/2018)   No facility-administered encounter medications on file as of 07/23/2018.     Activities of Daily Living In your present state of health, do you have any difficulty performing the following activities: 07/23/2018 11/08/2017  Hearing? N N  Vision? N N  Difficulty concentrating or making decisions? N N  Walking or climbing stairs? N N  Dressing or bathing? N N  Doing errands, shopping? N N  Preparing Food and eating ? N -  Using the Toilet? N -  In the past six months, have you accidently leaked urine? N -  Do you have problems with loss of bowel control? N -  Managing your Medications? N -  Managing your Finances? N -  Housekeeping or managing your Housekeeping? N -  Some recent  data might be hidden   Patient Care Team: Hoyt Koch, MD as PCP - General (Internal Medicine) Juanito Doom, MD as Consulting Physician (Pulmonary Disease) Irene Shipper, MD as Consulting Physician (Gastroenterology)    Assessment:   This is a routine wellness examination for [facility]. Physical assessment deferred to PCP.   Exercise Activities and Dietary recommendations Current Exercise Habits: The patient does not participate in regular exercise at present, Exercise limited by: respiratory conditions(s);orthopedic condition(s)  Diet (meal preparation, eat out, water intake, caffeinated beverages, dairy products, fruits and vegetables): in general, an "unhealthy" diet, on average, 1 meals per day Patient states she can only eat and drink small amounts. She c/o abdominal pain if she consumes too much.   Discussed supplementing with glucerna, samples and coupons provided. Encouraged patient to increase daily water, healthy fluid intake and to take small amounts of food and drink frequently.  Goals    . Increase my physical activity     Each day move around the house and yard a little bit more    . Patient Stated     Stay as healthy and as independent as possible       Fall Risk Fall Risk  07/23/2018 07/19/2017 01/18/2017 02/25/2016 07/29/2015  Falls in the past year? No No No Yes Yes  Number falls in past yr: - - - 2 or more 2 or more  Injury with Fall? - - - Yes Yes  Risk Factor Category  - - - - High Fall Risk  Follow up - - - - Education provided;Falls prevention discussed;Falls evaluation completed    Depression Screen PHQ 2/9 Scores 07/23/2018 07/19/2017 01/18/2017  PHQ - 2 Score '2 2 6  ' PHQ- 9 Score 9 9 -     Cognitive Function MMSE - Mini Mental  State Exam 07/23/2018 07/19/2017  Orientation to time 5 5  Orientation to Place 5 5  Registration 3 3  Attention/ Calculation 3 4  Recall 1 1  Language- name 2 objects 2 2  Language- repeat 1 1  Language-  follow 3 step command 3 3  Language- read & follow direction 1 1  Write a sentence 1 1  Copy design 1 1  Total score 26 27        Immunization History  Administered Date(s) Administered  . Influenza Split 12/15/2011  . Influenza, High Dose Seasonal PF 07/28/2015, 07/19/2016, 07/19/2017, 07/23/2018  . Influenza,inj,Quad PF,6+ Mos 10/12/2014  . Pneumococcal Conjugate-13 04/13/2017  . Pneumococcal Polysaccharide-23 12/15/2011  . Pneumococcal-Unspecified 01/25/2016  . Tdap 01/25/2016   Screening Tests Health Maintenance  Topic Date Due  . Hepatitis C Screening  1946-05-12  . HEMOGLOBIN A1C  01/17/2018  . OPHTHALMOLOGY EXAM  01/31/2018  . FOOT EXAM  07/19/2018  . MAMMOGRAM  02/02/2019  . COLONOSCOPY  09/05/2022  . TETANUS/TDAP  01/24/2026  . INFLUENZA VACCINE  Completed  . DEXA SCAN  Completed  . PNA vac Low Risk Adult  Completed      Plan:    Scheduled an appointment with PCP 07/29/18 to address pain in abdomen and neck, difficulty eating and drinking.   [facility] referral to assist with COPD and community resources.  Continue doing brain stimulating activities (puzzles, reading, adult coloring books, staying active) to keep memory sharp.   Continue to eat heart healthy diet (full of fruits, vegetables, whole grains, lean protein, water--limit salt, fat, and sugar intake) and increase physical activity as tolerated.  I have personally reviewed and noted the following in the patient's chart:   . Medical and social history . Use of alcohol, tobacco or illicit drugs  . Current medications and supplements . Functional ability and status . Nutritional status . Physical activity . Advanced directives . List of other physicians . Vitals . Screenings to include cognitive, depression, and falls . Referrals and appointments  In addition, I have reviewed and discussed with patient certain preventive protocols, quality metrics, and best practice recommendations. A written  personalized care plan for preventive services as well as general preventive health recommendations were provided to patient.     Michiel Cowboy, RN  07/23/2018

## 2018-07-23 ENCOUNTER — Ambulatory Visit (INDEPENDENT_AMBULATORY_CARE_PROVIDER_SITE_OTHER): Payer: Medicare Other | Admitting: *Deleted

## 2018-07-23 VITALS — BP 146/62 | HR 79 | Resp 17 | Ht 63.0 in | Wt 177.0 lb

## 2018-07-23 DIAGNOSIS — Z23 Encounter for immunization: Secondary | ICD-10-CM

## 2018-07-23 DIAGNOSIS — Z0000 Encounter for general adult medical examination without abnormal findings: Secondary | ICD-10-CM

## 2018-07-23 NOTE — Patient Instructions (Addendum)
Continue doing brain stimulating activities (puzzles, reading, adult coloring books, staying active) to keep memory sharp.   Continue to eat heart healthy diet (full of fruits, vegetables, whole grains, lean protein, water--limit salt, fat, and sugar intake) and increase physical activity as tolerated.  www.auntbertha.com or down load app on smart phone  Aunt Berenice Primas website lists multiple social resources for individuals such as: food, health, money, house hold goods, transit, medical supplies, job training and legal services.    Victoria Bird , Thank you for taking time to come for your Medicare Wellness Visit. I appreciate your ongoing commitment to your health goals. Please review the following plan we discussed and let me know if I can assist you in the future.   These are the goals we discussed: Goals    . Increase my physical activity     Each day move around the house and yard a little bit more    . Patient Stated     Stay as healthy and as independent as possible       This is a list of the screening recommended for you and due dates:  Health Maintenance  Topic Date Due  .  Hepatitis C: One time screening is recommended by Center for Disease Control  (CDC) for  adults born from [birth year] through 1964.   [birth year]  . Hemoglobin A1C  01/17/2018  . Eye exam for diabetics  01/31/2018  . Flu Shot  05/02/2018  . Complete foot exam   07/19/2018  . Mammogram  02/02/2019  . Colon Cancer Screening  09/05/2022  . Tetanus Vaccine  01/24/2026  . DEXA scan (bone density measurement)  Completed  . Pneumonia vaccines  Completed    Health Maintenance, Female Adopting a healthy lifestyle and getting preventive care can go a long way to promote health and wellness. Talk with your health care provider about what schedule of regular examinations is right for you. This is a good chance for you to check in with your provider about disease prevention and staying healthy. In between checkups, there  are plenty of things you can do on your own. Experts have done a lot of research about which lifestyle changes and preventive measures are most likely to keep you healthy. Ask your health care provider for more information. Weight and diet Eat a healthy diet  Be sure to include plenty of vegetables, fruits, low-fat dairy products, and lean protein.  Do not eat a lot of foods high in solid fats, added sugars, or salt.  Get regular exercise. This is one of the most important things you can do for your health. ? Most adults should exercise for at least 150 minutes each week. The exercise should increase your heart rate and make you sweat (moderate-intensity exercise). ? Most adults should also do strengthening exercises at least twice a week. This is in addition to the moderate-intensity exercise.  Maintain a healthy weight  Body mass index (BMI) is a measurement that can be used to identify possible weight problems. It estimates body fat based on height and weight. Your health care provider can help determine your BMI and help you achieve or maintain a healthy weight.  For females 50 years of age and older: ? A BMI below 18.5 is considered underweight. ? A BMI of 18.5 to 24.9 is normal. ? A BMI of 25 to 29.9 is considered overweight. ? A BMI of 30 and above is considered obese.  Watch levels of cholesterol and blood lipids  You should start having your blood tested for lipids and cholesterol at 71 years of age, then have this test every 5 years.  You may need to have your cholesterol levels checked more often if: ? Your lipid or cholesterol levels are high. ? You are older than 71 years of age. ? You are at high risk for heart disease.  Cancer screening Lung Cancer  Lung cancer screening is recommended for adults 60-5 years old who are at high risk for lung cancer because of a history of smoking.  A yearly low-dose CT scan of the lungs is recommended for people who: ? Currently  smoke. ? Have quit within the past 15 years. ? Have at least a 30-pack-year history of smoking. A pack year is smoking an average of one pack of cigarettes a day for 1 year.  Yearly screening should continue until it has been 15 years since you quit.  Yearly screening should stop if you develop a health problem that would prevent you from having lung cancer treatment.  Breast Cancer  Practice breast self-awareness. This means understanding how your breasts normally appear and feel.  It also means doing regular breast self-exams. Let your health care provider know about any changes, no matter how small.  If you are in your 20s or 30s, you should have a clinical breast exam (CBE) by a health care provider every 1-3 years as part of a regular health exam.  If you are 74 or older, have a CBE every year. Also consider having a breast X-ray (mammogram) every year.  If you have a family history of breast cancer, talk to your health care provider about genetic screening.  If you are at high risk for breast cancer, talk to your health care provider about having an MRI and a mammogram every year.  Breast cancer gene (BRCA) assessment is recommended for women who have family members with BRCA-related cancers. BRCA-related cancers include: ? Breast. ? Ovarian. ? Tubal. ? Peritoneal cancers.  Results of the assessment will determine the need for genetic counseling and BRCA1 and BRCA2 testing.  Cervical Cancer Your health care provider may recommend that you be screened regularly for cancer of the pelvic organs (ovaries, uterus, and vagina). This screening involves a pelvic examination, including checking for microscopic changes to the surface of your cervix (Pap test). You may be encouraged to have this screening done every 3 years, beginning at age 41.  For women ages 35-65, health care providers may recommend pelvic exams and Pap testing every 3 years, or they may recommend the Pap and pelvic  exam, combined with testing for human papilloma virus (HPV), every 5 years. Some types of HPV increase your risk of cervical cancer. Testing for HPV may also be done on women of any age with unclear Pap test results.  Other health care providers may not recommend any screening for nonpregnant women who are considered low risk for pelvic cancer and who do not have symptoms. Ask your health care provider if a screening pelvic exam is right for you.  If you have had past treatment for cervical cancer or a condition that could lead to cancer, you need Pap tests and screening for cancer for at least 20 years after your treatment. If Pap tests have been discontinued, your risk factors (such as having a new sexual partner) need to be reassessed to determine if screening should resume. Some women have medical problems that increase the chance of getting cervical cancer. In these  cases, your health care provider may recommend more frequent screening and Pap tests.  Colorectal Cancer  This type of cancer can be detected and often prevented.  Routine colorectal cancer screening usually begins at 71 years of age and continues through 71 years of age.  Your health care provider may recommend screening at an earlier age if you have risk factors for colon cancer.  Your health care provider may also recommend using home test kits to check for hidden blood in the stool.  A small camera at the end of a tube can be used to examine your colon directly (sigmoidoscopy or colonoscopy). This is done to check for the earliest forms of colorectal cancer.  Routine screening usually begins at age 48.  Direct examination of the colon should be repeated every 5-10 years through 70 years of age. However, you may need to be screened more often if early forms of precancerous polyps or small growths are found.  Skin Cancer  Check your skin from head to toe regularly.  Tell your health care provider about any new moles or  changes in moles, especially if there is a change in a mole's shape or color.  Also tell your health care provider if you have a mole that is larger than the size of a pencil eraser.  Always use sunscreen. Apply sunscreen liberally and repeatedly throughout the day.  Protect yourself by wearing long sleeves, pants, a wide-brimmed hat, and sunglasses whenever you are outside.  Heart disease, diabetes, and high blood pressure  High blood pressure causes heart disease and increases the risk of stroke. High blood pressure is more likely to develop in: ? People who have blood pressure in the high end of the normal range (130-139/85-89 mm Hg). ? People who are overweight or obese. ? People who are African American.  If you are 22-87 years of age, have your blood pressure checked every 3-5 years. If you are 46 years of age or older, have your blood pressure checked every year. You should have your blood pressure measured twice-once when you are at a hospital or clinic, and once when you are not at a hospital or clinic. Record the average of the two measurements. To check your blood pressure when you are not at a hospital or clinic, you can use: ? An automated blood pressure machine at a pharmacy. ? A home blood pressure monitor.  If you are between 25 years and 76 years old, ask your health care provider if you should take aspirin to prevent strokes.  Have regular diabetes screenings. This involves taking a blood sample to check your fasting blood sugar level. ? If you are at a normal weight and have a low risk for diabetes, have this test once every three years after 71 years of age. ? If you are overweight and have a high risk for diabetes, consider being tested at a younger age or more often. Preventing infection Hepatitis B  If you have a higher risk for hepatitis B, you should be screened for this virus. You are considered at high risk for hepatitis B if: ? You were born in a country where  hepatitis B is common. Ask your health care provider which countries are considered high risk. ? Your parents were born in a high-risk country, and you have not been immunized against hepatitis B (hepatitis B vaccine). ? You have HIV or AIDS. ? You use needles to inject street drugs. ? You live with someone who has  hepatitis B. ? You have had sex with someone who has hepatitis B. ? You get hemodialysis treatment. ? You take certain medicines for conditions, including cancer, organ transplantation, and autoimmune conditions.  Hepatitis C  Blood testing is recommended for: ? Everyone born from [birth year] through 1964. ? Anyone with known risk factors for hepatitis C.  Sexually transmitted infections (STIs)  You should be screened for sexually transmitted infections (STIs) including gonorrhea and chlamydia if: ? You are sexually active and are younger than 71 years of age. ? You are older than 71 years of age and your health care provider tells you that you are at risk for this type of infection. ? Your sexual activity has changed since you were last screened and you are at an increased risk for chlamydia or gonorrhea. Ask your health care provider if you are at risk.  If you do not have HIV, but are at risk, it may be recommended that you take a prescription medicine daily to prevent HIV infection. This is called pre-exposure prophylaxis (PrEP). You are considered at risk if: ? You are sexually active and do not regularly use condoms or know the HIV status of your partner(s). ? You take drugs by injection. ? You are sexually active with a partner who has HIV.  Talk with your health care provider about whether you are at high risk of being infected with HIV. If you choose to begin PrEP, you should first be tested for HIV. You should then be tested every 3 months for as long as you are taking PrEP. Pregnancy  If you are premenopausal and you may become pregnant, ask your health care provider  about preconception counseling.  If you may become pregnant, take 400 to 800 micrograms (mcg) of folic acid every day.  If you want to prevent pregnancy, talk to your health care provider about birth control (contraception). Osteoporosis and menopause  Osteoporosis is a disease in which the bones lose minerals and strength with aging. This can result in serious bone fractures. Your risk for osteoporosis can be identified using a bone density scan.  If you are 26 years of age or older, or if you are at risk for osteoporosis and fractures, ask your health care provider if you should be screened.  Ask your health care provider whether you should take a calcium or vitamin D supplement to lower your risk for osteoporosis.  Menopause may have certain physical symptoms and risks.  Hormone replacement therapy may reduce some of these symptoms and risks. Talk to your health care provider about whether hormone replacement therapy is right for you. Follow these instructions at home:  Schedule regular health, dental, and eye exams.  Stay current with your immunizations.  Do not use any tobacco products including cigarettes, chewing tobacco, or electronic cigarettes.  If you are pregnant, do not drink alcohol.  If you are breastfeeding, limit how much and how often you drink alcohol.  Limit alcohol intake to no more than 1 drink per day for nonpregnant women. One drink equals 12 ounces of beer, 5 ounces of wine, or 1 ounces of hard liquor.  Do not use street drugs.  Do not share needles.  Ask your health care provider for help if you need support or information about quitting drugs.  Tell your health care provider if you often feel depressed.  Tell your health care provider if you have ever been abused or do not feel safe at home. This information is not intended  to replace advice given to you by your health care provider. Make sure you discuss any questions you have with your health care  provider. Document Released: 04/03/2011 Document Revised: 02/24/2016 Document Reviewed: 06/22/2015 Elsevier Interactive Patient Education  2018 [place]. Influenza Virus Vaccine injection What is this medicine? INFLUENZA VIRUS VACCINE (in floo EN zuh VAHY ruhs vak SEEN) helps to reduce the risk of getting influenza also known as the flu. The vaccine only helps protect you against some strains of the flu. This medicine may be used for other purposes; ask your health care provider or pharmacist if you have questions. COMMON BRAND NAME(S): Afluria, Agriflu, Alfuria, FLUAD, Fluarix, Fluarix Quadrivalent, Flublok, Flublok Quadrivalent, FLUCELVAX, Flulaval, Fluvirin, Fluzone, Fluzone High-Dose, Fluzone Intradermal What should I tell my health care provider before I take this medicine? They need to know if you have any of these conditions: -bleeding disorder like hemophilia -fever or infection -Guillain-Barre syndrome or other neurological problems -immune system problems -infection with the human immunodeficiency virus (HIV) or AIDS -low blood platelet counts -multiple sclerosis -an unusual or allergic reaction to influenza virus vaccine, latex, other medicines, foods, dyes, or preservatives. Different brands of vaccines contain different allergens. Some may contain latex or eggs. Talk to your doctor about your allergies to make sure that you get the right vaccine. -pregnant or trying to get pregnant -breast-feeding How should I use this medicine? This vaccine is for injection into a muscle or under the skin. It is given by a health care professional. A copy of Vaccine Information Statements will be given before each vaccination. Read this sheet carefully each time. The sheet may change frequently. Talk to your healthcare provider to see which vaccines are right for you. Some vaccines should not be used in all age groups. Overdosage: If you think you have taken too much of this medicine  contact a poison control center or emergency room at once. NOTE: This medicine is only for you. Do not share this medicine with others. What if I miss a dose? This does not apply. What may interact with this medicine? -chemotherapy or radiation therapy -medicines that lower your immune system like etanercept, anakinra, infliximab, and adalimumab -medicines that treat or prevent blood clots like warfarin -phenytoin -steroid medicines like prednisone or cortisone -theophylline -vaccines This list may not describe all possible interactions. Give your health care provider a list of all the medicines, herbs, non-prescription drugs, or dietary supplements you use. Also tell them if you smoke, drink alcohol, or use illegal drugs. Some items may interact with your medicine. What should I watch for while using this medicine? Report any side effects that do not go away within 3 days to your doctor or health care professional. Call your health care provider if any unusual symptoms occur within 6 weeks of receiving this vaccine. You may still catch the flu, but the illness is not usually as bad. You cannot get the flu from the vaccine. The vaccine will not protect against colds or other illnesses that may cause fever. The vaccine is needed every year. What side effects may I notice from receiving this medicine? Side effects that you should report to your doctor or health care professional as soon as possible: -allergic reactions like skin rash, itching or hives, swelling of the face, lips, or tongue Side effects that usually do not require medical attention (report to your doctor or health care professional if they continue or are bothersome): -fever -headache -muscle aches and pains -pain, tenderness, redness, or  swelling at the injection site -tiredness This list may not describe all possible side effects. Call your doctor for medical advice about side effects. You may report side effects to FDA at  1-800-FDA-1088. Where should I keep my medicine? The vaccine will be given by a health care professional in a clinic, pharmacy, doctor's office, or other health care setting. You will not be given vaccine doses to store at home. NOTE: This sheet is a summary. It may not cover all possible information. If you have questions about this medicine, talk to your doctor, pharmacist, or health care provider.  2018 Elsevier/Gold Standard (2015-04-09 10:07:28)

## 2018-07-24 ENCOUNTER — Other Ambulatory Visit: Payer: Self-pay | Admitting: *Deleted

## 2018-07-24 NOTE — Progress Notes (Signed)
Medical screening examination/treatment/procedure(s) were performed by non-physician practitioner and as supervising physician I was immediately available for consultation/collaboration. I agree with above. Gillian Kluever A Aycen Porreca, MD 

## 2018-07-24 NOTE — Patient Outreach (Signed)
  [facility]) Care Management  07/24/2018  Victoria Bird July 15, 1946 [id number]   Telephone Screen  Referral Date: 07/24/18 Referral Source: [facility]  Referral Reason: communtiy resource assistance. Resp. status has not improve much since last hospitalization   Diagnoses of COPD/ Pneumonia   Insurance: united health care medicare    Outreach attempt # 1 No answer. [facility] RN CM left HIPAA compliant voicemail message along with CM's contact info.   Plan: [name] RN CM sent an unsuccessful outreach letter and scheduled this patient for another call attempt within 4 business days   Tarryn Bogdan L. Lavina Hamman, RN, BSN, [place] Coordinator Office number [phone] Mobile number [phone]  Main [facility] number [phone] Fax number [phone]

## 2018-07-25 ENCOUNTER — Other Ambulatory Visit: Payer: Self-pay | Admitting: *Deleted

## 2018-07-25 NOTE — Patient Outreach (Addendum)
[facility]) Care Management  07/25/2018  JAMIRA BARFUSS [birth year] [id number]   Telephone Screen  Referral Date: 07/24/18 Referral Source: [facility]  Referral Reason: communtiy resource assistance. Resp. status has not improve much since last hospitalization   Diagnoses of COPD/ Pneumonia   Insurance: united health care medicare    Outreach attempt # 2 No answer. [facility] RN CM left HIPAA compliant voicemail message along with CM's contact info.   Plan: [facility] RN CM scheduled this patient for a third call attempt within 4 business days notes to be routed to MD   Brooks. Lavina Hamman, RN, BSN, [place] Coordinator Office number [phone] Mobile number [phone]  Main [facility] number [phone] Fax number [phone]

## 2018-07-29 ENCOUNTER — Encounter: Payer: Self-pay | Admitting: Internal Medicine

## 2018-07-29 ENCOUNTER — Ambulatory Visit (INDEPENDENT_AMBULATORY_CARE_PROVIDER_SITE_OTHER): Payer: Medicare Other | Admitting: Internal Medicine

## 2018-07-29 ENCOUNTER — Ambulatory Visit (INDEPENDENT_AMBULATORY_CARE_PROVIDER_SITE_OTHER)
Admission: RE | Admit: 2018-07-29 | Discharge: 2018-07-29 | Disposition: A | Discharge status: Home / Self Care | Payer: Medicare Other | Source: Ambulatory Visit | Attending: Internal Medicine | Admitting: Internal Medicine

## 2018-07-29 ENCOUNTER — Other Ambulatory Visit (INDEPENDENT_AMBULATORY_CARE_PROVIDER_SITE_OTHER): Payer: Medicare Other

## 2018-07-29 VITALS — BP 122/60 | HR 70 | Temp 97.6°F | Ht 63.0 in | Wt 177.0 lb

## 2018-07-29 DIAGNOSIS — R1031 Right lower quadrant pain: Secondary | ICD-10-CM

## 2018-07-29 DIAGNOSIS — M542 Cervicalgia: Secondary | ICD-10-CM | POA: Diagnosis not present

## 2018-07-29 DIAGNOSIS — R1310 Dysphagia, unspecified: Secondary | ICD-10-CM

## 2018-07-29 DIAGNOSIS — E041 Nontoxic single thyroid nodule: Secondary | ICD-10-CM | POA: Diagnosis not present

## 2018-07-29 LAB — COMPREHENSIVE METABOLIC PANEL
ALK PHOS: 67 U/L (ref 39–117)
ALT: 13 U/L (ref 0–35)
AST: 12 U/L (ref 0–37)
Albumin: 4.1 g/dL (ref 3.5–5.2)
BILIRUBIN TOTAL: 0.6 mg/dL (ref 0.2–1.2)
BUN: 26 mg/dL — ABNORMAL HIGH (ref 6–23)
CALCIUM: 9.1 mg/dL (ref 8.4–10.5)
CO2: 25 meq/L (ref 19–32)
CREATININE: 2.05 mg/dL — AB (ref 0.40–1.20)
Chloride: 104 mEq/L (ref 96–112)
GFR: 25.35 mL/min — ABNORMAL LOW (ref >60.00)
GLUCOSE: 128 mg/dL — AB (ref 70–99)
Potassium: 4.3 mEq/L (ref 3.5–5.1)
Sodium: 139 mEq/L (ref 135–145)
TOTAL PROTEIN: 7 g/dL (ref 6.0–8.3)

## 2018-07-29 LAB — VITAMIN D 25 HYDROXY (VIT D DEFICIENCY, FRACTURES): VITD: 16.71 ng/mL — ABNORMAL LOW (ref 30.00–100.00)

## 2018-07-29 LAB — CK: CK TOTAL: 67 U/L (ref 7–177)

## 2018-07-29 LAB — LIPASE: LIPASE: 32 U/L (ref 11.0–59.0)

## 2018-07-29 LAB — FERRITIN: FERRITIN: 51.5 ng/mL (ref 10.0–291.0)

## 2018-07-29 LAB — MAGNESIUM: MAGNESIUM: 1.6 mg/dL (ref 1.5–2.5)

## 2018-07-29 LAB — CBC
HCT: 36.2 % (ref 36.0–46.0)
HEMOGLOBIN: 12.1 g/dL (ref 12.0–15.0)
MCHC: 33.4 g/dL (ref 30.0–36.0)
MCV: 84.4 fl (ref 78.0–100.0)
Platelets: 273 10*3/uL (ref 150.0–400.0)
RBC: 4.29 Mil/uL (ref 3.87–5.11)
RDW: 14.6 % (ref 11.5–15.5)
WBC: 10.6 10*3/uL — AB (ref 4.0–10.5)

## 2018-07-29 LAB — T4, FREE: Free T4: 0.83 ng/dL (ref 0.60–1.60)

## 2018-07-29 LAB — HEMOGLOBIN A1C: Hgb A1c MFr Bld: 5.6 % (ref 4.6–6.5)

## 2018-07-29 LAB — VITAMIN B12: VITAMIN B 12: 262 pg/mL (ref 211–911)

## 2018-07-29 LAB — TSH: TSH: 2.94 u[IU]/mL (ref 0.35–4.50)

## 2018-07-29 NOTE — Progress Notes (Signed)
Subjective:    Patient ID: Victoria Bird, female    DOB: [birth year], 72 y.o.   MRN: [id number]  HPI The patient is a 72 YO female coming in for several concerns including pain in abdomen (RLQ, severe when present, happens almost daily, sometimes triggered by eating too much food, sometimes no trigger, will last hours, not helped by having bowel movements, she is having very soft BM which are not fully formed during this same time frame, denies blood in stool, denies weight loss, prior diverticulitis and she states she normally is severely constipated with that, prior appendix and gallbladder removal, no recent imaging but AAA not followed up from several years ago which patient was unaware of), neck pain (severe in the base of the skull, 10/10 pain, has taken flexeril for it which does not help, daily, sometimes so bad she cannot turn her head, radiates down arms and into shoulders, some numbness and weakness in her hands, some pulling up of her fingers at times as well, denies anything making it better, moving makes it worse sometimes), and problems eating and drinking (several problems with this, the one we were able to focus more on was the sensation of throat closing up while eating, food does not get stuck, does better with soft foods, able to eat cereal and sandwich and mashed potatoes, denies weight loss, does have some sore throat, denies fevers or chills, denies nasal drainage, also with some problems with food getting stuck lower in her esophagus but she was unable to stay on track long enough to tell me about these symptoms).   Review of Systems  Constitutional: Positive for activity change, appetite change, fatigue and unexpected weight change. Negative for chills, diaphoresis and fever.  HENT: Positive for sore throat and trouble swallowing.   Eyes: Negative.   Respiratory: Negative for cough, chest tightness and shortness of breath.   Cardiovascular: Negative for chest pain,  palpitations and leg swelling.  Gastrointestinal: Positive for abdominal pain and diarrhea. Negative for abdominal distention, anal bleeding, blood in stool, constipation, nausea, rectal pain and vomiting.  Musculoskeletal: Positive for arthralgias, back pain, myalgias and neck pain. Negative for gait problem and joint swelling.  Skin: Negative.   Neurological: Negative.   Psychiatric/Behavioral: Negative.       Objective:   Physical Exam  Constitutional: She is oriented to person, place, and time. She appears well-developed and well-nourished.  HENT:  Head: Normocephalic and atraumatic.  Eyes: EOM are normal.  Neck: Normal range of motion.  Cardiovascular: Normal rate and regular rhythm.  Pulmonary/Chest: Effort normal and breath sounds normal. No respiratory distress. She has no wheezes. She has no rales.  Abdominal: Soft. Bowel sounds are normal. She exhibits no distension. There is tenderness. There is no rebound.  Musculoskeletal: She exhibits tenderness. She exhibits no edema.  Pain cervical neck and shoulders, with radiation to the arms and hands, during exam right finger is clenching  Neurological: She is alert and oriented to person, place, and time. Coordination normal.  Skin: Skin is warm and dry.  Psychiatric:  Rambling historian and hard to get a full history of any complaint as she continues switching topics   Vitals:   07/29/18 1332  BP: 122/60  Pulse: 70  Temp: 97.6 F (36.4 C)  TempSrc: Oral  SpO2: 94%  Weight: 177 lb (80.3 kg)  Height: 5\' 3"  (1.6 m)      Assessment & Plan:  Visit time 40 minutes: greater than 50% of that time  was spent in face to face counseling and coordination of care with the patient: counseled about her multiple new concerning symptoms in light of long term smoker with new swallowing problems, severe abdominal pain, as well as neck pain with arm weakness

## 2018-07-29 NOTE — Patient Instructions (Signed)
We are checking labs today and the CT of the stomach.   We are getting an x-ray of the neck today.  We are getting you in with an ear nose and throat doctor to look at the throat.   Depending on results we may need to make changes.   We will see you back in 1 month to follow up on these problems.

## 2018-07-29 NOTE — Assessment & Plan Note (Addendum)
Checking cervical x-ray today and she is having severe pain. She is also having some subjective weakness in her hands and numbness at times which could be related to nerve impingement. If x-ray negative may need CT scan neck. Due to serious nature of her other complaints and ongoing neck pain for several months will continue to manage. Checking CK and ANA to rule out alternative cause.

## 2018-07-29 NOTE — Assessment & Plan Note (Signed)
Checking CT abdomen and pelvis. Given CKD which was acutely worse on last labs (by pulm which was not addressed or followed up) likely cannot do IV contrast but oral would still be helpful to rule out diverticulitis. She has appendix and gallbladder removed at this time. Checking CMP, CBC, lipase to rule out alternative etiology.

## 2018-07-29 NOTE — Assessment & Plan Note (Signed)
Given long term smoking needs referral to ENT for direct visualization of throat. If normal then needs more evaluation of esophagus with swallowing study. Given no weight loss this is not urgent at this time. Will re-evaluate and treat more at next visit.

## 2018-07-30 ENCOUNTER — Telehealth: Payer: Self-pay | Admitting: Pulmonary Disease

## 2018-07-30 NOTE — Telephone Encounter (Signed)
Spoke with pt , we did not call pt for anything that I can see in her chart. Dr. Nathanial Millman office tried to call her about lab results. I advised her to call their office for the results. Pt understood and nothing further is needed.

## 2018-07-31 ENCOUNTER — Other Ambulatory Visit: Payer: Self-pay | Admitting: *Deleted

## 2018-07-31 NOTE — Patient Outreach (Signed)
[facility]) Care Management  07/31/2018  Victoria Bird [birth year] [id number]   Telephone Screen  Referral Date:07/24/18 Referral Source:[facility] Referral Reason:communtiy resource assistance. Resp. status has not improve much since last hospitalization   Diagnoses of COPD/ Pneumonia   Insurance:[employer] medicare   Outreach attempt # 3 No answer. [facility] RN CM left HIPAA compliant voicemail message along with CM's contact info.   Plan: [name] RN CM scheduled this patient for case closure within 4 business days notes to be routed to MD   [place]. Lavina Hamman, RN, BSN, [place] Coordinator Office number [phone] Mobile number [phone]  Main [facility] number [phone] Fax number [phone]

## 2018-08-01 ENCOUNTER — Inpatient Hospital Stay: Admission: RE | Admit: 2018-08-01 | Payer: Medicare Other | Source: Ambulatory Visit

## 2018-08-02 ENCOUNTER — Telehealth: Payer: Self-pay | Admitting: Internal Medicine

## 2018-08-02 ENCOUNTER — Other Ambulatory Visit: Payer: Self-pay | Admitting: *Deleted

## 2018-08-02 NOTE — Patient Outreach (Signed)
[facility]) Care Management  08/02/2018  Victoria Bird 08-Jun-1946 [id number]   Care coordination  Referral Date:07/24/18 Referral Source:[facility] Referral Reason:communtiy resource assistance. Resp. status has not improve much since last hospitalization   Diagnoses of COPD/ Pneumonia   Insurance:[employer] medicare   After 3 call attempt and an outreach letter sent to this patient without a response, [facility] RN CM called Victoria Bird, Dr Lesly Rubenstein office and spoke with Victoria Bird who confirms the contact number CM has been calling is correct. Shy to update Dr Sharlet Salina of unsuccessful engagement with this patient  Plans  [facility] RN CM scheduled this patient forcase closure within 3 business days  Mitchell L. Lavina Hamman, RN, BSN, [place] Coordinator Office number [phone] Mobile number [phone]  Main [facility] number [phone] Fax number [phone]

## 2018-08-02 NOTE — Telephone Encounter (Signed)
Copied from [place] [id number]. Topic: General - Other >> Aug 02, 2018 12:18 PM Janace Aris A wrote: Reason for CRM: Despina Hick from [employer] called in, she wanted to advise the pt's provider that she has tried to reach the pt numerous time regarding the referral. She says they have left VM's and send a letter and no response back. Joelene Millin says the last day she can keep this referral open would be 08/08/18

## 2018-08-06 NOTE — Telephone Encounter (Signed)
fyi

## 2018-08-07 ENCOUNTER — Other Ambulatory Visit: Payer: Self-pay | Admitting: *Deleted

## 2018-08-07 NOTE — Patient Outreach (Addendum)
[facility]) Care Management  08/07/2018  SHAWNDRA CLUTE [birth year] [id number]  Case closure   Call attempts made on 07/24/18, 07/25/18  and  07/31/18  Unsuccessful outreach letter sent on 07/24/18 without a response for a referral from MD- [facility]   Plan [facility] RN CM will close case after no response from patient within 10 business days. Unable to reach Case closure letters sent to MD  Joelene Millin L. Lavina Hamman, RN, BSN, [place] Coordinator Office number [phone] Mobile number [phone]  Main [facility] number [phone] Fax number [phone]

## 2018-08-20 DIAGNOSIS — J449 Chronic obstructive pulmonary disease, unspecified: Secondary | ICD-10-CM | POA: Diagnosis not present

## 2018-08-22 ENCOUNTER — Other Ambulatory Visit: Payer: Self-pay | Admitting: Internal Medicine

## 2018-09-13 ENCOUNTER — Encounter: Payer: Self-pay | Admitting: Internal Medicine

## 2018-09-19 DIAGNOSIS — J449 Chronic obstructive pulmonary disease, unspecified: Secondary | ICD-10-CM | POA: Diagnosis not present

## 2018-10-20 DIAGNOSIS — J449 Chronic obstructive pulmonary disease, unspecified: Secondary | ICD-10-CM | POA: Diagnosis not present

## 2018-10-24 ENCOUNTER — Inpatient Hospital Stay (HOSPITAL_COMMUNITY)
Admission: EM | Admit: 2018-10-24 | Discharge: 2018-10-28 | DRG: 190 | Disposition: A | Discharge status: Home Health | Payer: Medicare Other | Attending: Internal Medicine | Admitting: Internal Medicine

## 2018-10-24 ENCOUNTER — Other Ambulatory Visit: Payer: Self-pay

## 2018-10-24 ENCOUNTER — Emergency Department (HOSPITAL_COMMUNITY): Payer: Medicare Other

## 2018-10-24 ENCOUNTER — Encounter (HOSPITAL_COMMUNITY): Payer: Self-pay

## 2018-10-24 DIAGNOSIS — Z8701 Personal history of pneumonia (recurrent): Secondary | ICD-10-CM

## 2018-10-24 DIAGNOSIS — R1084 Generalized abdominal pain: Secondary | ICD-10-CM | POA: Diagnosis not present

## 2018-10-24 DIAGNOSIS — R05 Cough: Secondary | ICD-10-CM | POA: Diagnosis not present

## 2018-10-24 DIAGNOSIS — R0902 Hypoxemia: Secondary | ICD-10-CM

## 2018-10-24 DIAGNOSIS — R600 Localized edema: Secondary | ICD-10-CM | POA: Diagnosis not present

## 2018-10-24 DIAGNOSIS — Z880 Allergy status to penicillin: Secondary | ICD-10-CM

## 2018-10-24 DIAGNOSIS — E785 Hyperlipidemia, unspecified: Secondary | ICD-10-CM | POA: Diagnosis not present

## 2018-10-24 DIAGNOSIS — J189 Pneumonia, unspecified organism: Secondary | ICD-10-CM | POA: Diagnosis present

## 2018-10-24 DIAGNOSIS — Z794 Long term (current) use of insulin: Secondary | ICD-10-CM

## 2018-10-24 DIAGNOSIS — E7800 Pure hypercholesterolemia, unspecified: Secondary | ICD-10-CM | POA: Diagnosis present

## 2018-10-24 DIAGNOSIS — N179 Acute kidney failure, unspecified: Secondary | ICD-10-CM

## 2018-10-24 DIAGNOSIS — Z882 Allergy status to sulfonamides status: Secondary | ICD-10-CM | POA: Diagnosis not present

## 2018-10-24 DIAGNOSIS — E1122 Type 2 diabetes mellitus with diabetic chronic kidney disease: Secondary | ICD-10-CM | POA: Diagnosis present

## 2018-10-24 DIAGNOSIS — Z885 Allergy status to narcotic agent status: Secondary | ICD-10-CM

## 2018-10-24 DIAGNOSIS — Z888 Allergy status to other drugs, medicaments and biological substances status: Secondary | ICD-10-CM | POA: Diagnosis not present

## 2018-10-24 DIAGNOSIS — E1121 Type 2 diabetes mellitus with diabetic nephropathy: Secondary | ICD-10-CM | POA: Diagnosis not present

## 2018-10-24 DIAGNOSIS — N189 Chronic kidney disease, unspecified: Secondary | ICD-10-CM | POA: Diagnosis not present

## 2018-10-24 DIAGNOSIS — G4733 Obstructive sleep apnea (adult) (pediatric): Secondary | ICD-10-CM | POA: Diagnosis not present

## 2018-10-24 DIAGNOSIS — Z87891 Personal history of nicotine dependence: Secondary | ICD-10-CM | POA: Diagnosis not present

## 2018-10-24 DIAGNOSIS — E119 Type 2 diabetes mellitus without complications: Secondary | ICD-10-CM

## 2018-10-24 DIAGNOSIS — Z79899 Other long term (current) drug therapy: Secondary | ICD-10-CM | POA: Diagnosis not present

## 2018-10-24 DIAGNOSIS — N183 Chronic kidney disease, stage 3 (moderate): Secondary | ICD-10-CM | POA: Diagnosis present

## 2018-10-24 DIAGNOSIS — J9611 Chronic respiratory failure with hypoxia: Secondary | ICD-10-CM | POA: Diagnosis not present

## 2018-10-24 DIAGNOSIS — I10 Essential (primary) hypertension: Secondary | ICD-10-CM | POA: Diagnosis present

## 2018-10-24 DIAGNOSIS — I129 Hypertensive chronic kidney disease with stage 1 through stage 4 chronic kidney disease, or unspecified chronic kidney disease: Secondary | ICD-10-CM | POA: Diagnosis present

## 2018-10-24 DIAGNOSIS — Z881 Allergy status to other antibiotic agents status: Secondary | ICD-10-CM | POA: Diagnosis not present

## 2018-10-24 DIAGNOSIS — R0602 Shortness of breath: Secondary | ICD-10-CM | POA: Diagnosis not present

## 2018-10-24 DIAGNOSIS — Z9981 Dependence on supplemental oxygen: Secondary | ICD-10-CM

## 2018-10-24 DIAGNOSIS — J441 Chronic obstructive pulmonary disease with (acute) exacerbation: Secondary | ICD-10-CM

## 2018-10-24 DIAGNOSIS — J439 Emphysema, unspecified: Secondary | ICD-10-CM | POA: Diagnosis not present

## 2018-10-24 DIAGNOSIS — R062 Wheezing: Secondary | ICD-10-CM | POA: Diagnosis not present

## 2018-10-24 LAB — BASIC METABOLIC PANEL
Anion gap: 10 (ref 5–15)
BUN: 27 mg/dL — ABNORMAL HIGH (ref 8–23)
CHLORIDE: 104 mmol/L (ref 98–111)
CO2: 24 mmol/L (ref 22–32)
Calcium: 8.8 mg/dL — ABNORMAL LOW (ref 8.9–10.3)
Creatinine, Ser: 2.32 mg/dL — ABNORMAL HIGH (ref 0.44–1.00)
GFR calc Af Amer: 24 mL/min — ABNORMAL LOW (ref >60)
GFR calc non Af Amer: 20 mL/min — ABNORMAL LOW (ref >60)
Glucose, Bld: 124 mg/dL — ABNORMAL HIGH (ref 70–99)
Potassium: 3.7 mmol/L (ref 3.5–5.1)
SODIUM: 138 mmol/L (ref 135–145)

## 2018-10-24 LAB — CBC WITH DIFFERENTIAL/PLATELET
Abs Immature Granulocytes: 0.04 10*3/uL (ref 0.00–0.07)
Basophils Absolute: 0 10*3/uL (ref 0.0–0.1)
Basophils Relative: 0 %
EOS PCT: 1 %
Eosinophils Absolute: 0.1 10*3/uL (ref 0.0–0.5)
HEMATOCRIT: 35.6 % — AB (ref 36.0–46.0)
Hemoglobin: 11.1 g/dL — ABNORMAL LOW (ref 12.0–15.0)
Immature Granulocytes: 0 %
Lymphocytes Relative: 16 %
Lymphs Abs: 1.7 10*3/uL (ref 0.7–4.0)
MCH: 27.3 pg (ref 26.0–34.0)
MCHC: 31.2 g/dL (ref 30.0–36.0)
MCV: 87.7 fL (ref 80.0–100.0)
Monocytes Absolute: 1 10*3/uL (ref 0.1–1.0)
Monocytes Relative: 9 %
Neutro Abs: 7.9 10*3/uL — ABNORMAL HIGH (ref 1.7–7.7)
Neutrophils Relative %: 74 %
Platelets: 276 10*3/uL (ref 150–400)
RBC: 4.06 MIL/uL (ref 3.87–5.11)
RDW: 13.2 % (ref 11.5–15.5)
WBC: 10.9 10*3/uL — ABNORMAL HIGH (ref 4.0–10.5)
nRBC: 0 % (ref 0.0–0.2)

## 2018-10-24 LAB — BRAIN NATRIURETIC PEPTIDE: B Natriuretic Peptide: 45.6 pg/mL (ref 0.0–100.0)

## 2018-10-24 LAB — I-STAT TROPONIN, ED: Troponin i, poc: 0 ng/mL (ref 0.00–0.08)

## 2018-10-24 LAB — INFLUENZA PANEL BY PCR (TYPE A & B)
Influenza A By PCR: NEGATIVE
Influenza B By PCR: NEGATIVE

## 2018-10-24 LAB — GLUCOSE, CAPILLARY: Glucose-Capillary: 119 mg/dL — ABNORMAL HIGH (ref 70–99)

## 2018-10-24 MED ORDER — LEVOFLOXACIN IN D5W 750 MG/150ML IV SOLN
750.0000 mg | Freq: Once | INTRAVENOUS | Status: AC
  Administered 2018-10-24: 750 mg via INTRAVENOUS
  Filled 2018-10-24: qty 150

## 2018-10-24 MED ORDER — HYDRALAZINE HCL 25 MG PO TABS
25.0000 mg | ORAL_TABLET | Freq: Three times a day (TID) | ORAL | Status: DC
  Administered 2018-10-25 – 2018-10-28 (×11): 25 mg via ORAL
  Filled 2018-10-24 (×11): qty 1

## 2018-10-24 MED ORDER — ENOXAPARIN SODIUM 30 MG/0.3ML ~~LOC~~ SOLN
30.0000 mg | Freq: Every day | SUBCUTANEOUS | Status: DC
  Administered 2018-10-25 – 2018-10-27 (×4): 30 mg via SUBCUTANEOUS
  Filled 2018-10-24 (×4): qty 0.3

## 2018-10-24 MED ORDER — AMLODIPINE BESYLATE 10 MG PO TABS
10.0000 mg | ORAL_TABLET | Freq: Every day | ORAL | Status: DC
  Administered 2018-10-25 – 2018-10-28 (×4): 10 mg via ORAL
  Filled 2018-10-24 (×4): qty 1

## 2018-10-24 MED ORDER — PANTOPRAZOLE SODIUM 40 MG PO TBEC
40.0000 mg | DELAYED_RELEASE_TABLET | Freq: Every day | ORAL | Status: DC
  Administered 2018-10-25 – 2018-10-28 (×4): 40 mg via ORAL
  Filled 2018-10-24 (×4): qty 1

## 2018-10-24 MED ORDER — LABETALOL HCL 100 MG PO TABS
100.0000 mg | ORAL_TABLET | Freq: Two times a day (BID) | ORAL | Status: DC
  Administered 2018-10-25 – 2018-10-28 (×8): 100 mg via ORAL
  Filled 2018-10-24 (×8): qty 1

## 2018-10-24 MED ORDER — INSULIN ASPART 100 UNIT/ML ~~LOC~~ SOLN
0.0000 [IU] | Freq: Every day | SUBCUTANEOUS | Status: DC
  Administered 2018-10-27: 2 [IU] via SUBCUTANEOUS

## 2018-10-24 MED ORDER — ALBUTEROL SULFATE (2.5 MG/3ML) 0.083% IN NEBU: 3.0000 mL | INHALATION_SOLUTION | Freq: Four times a day (QID) | RESPIRATORY_TRACT | Status: DC | PRN

## 2018-10-24 MED ORDER — SODIUM CHLORIDE 0.9 % IV SOLN
INTRAVENOUS | Status: AC
  Administered 2018-10-25: via INTRAVENOUS

## 2018-10-24 MED ORDER — METHYLPREDNISOLONE SODIUM SUCC 40 MG IJ SOLR
40.0000 mg | Freq: Two times a day (BID) | INTRAMUSCULAR | Status: DC
  Administered 2018-10-25 – 2018-10-28 (×7): 40 mg via INTRAVENOUS
  Filled 2018-10-24 (×7): qty 1

## 2018-10-24 MED ORDER — IPRATROPIUM-ALBUTEROL 0.5-2.5 (3) MG/3ML IN SOLN
3.0000 mL | Freq: Four times a day (QID) | RESPIRATORY_TRACT | Status: DC
  Administered 2018-10-25 (×4): 3 mL via RESPIRATORY_TRACT
  Filled 2018-10-24 (×4): qty 3

## 2018-10-24 MED ORDER — CYCLOBENZAPRINE HCL 5 MG PO TABS
5.0000 mg | ORAL_TABLET | Freq: Three times a day (TID) | ORAL | Status: DC | PRN
  Administered 2018-10-25 – 2018-10-27 (×3): 5 mg via ORAL
  Filled 2018-10-24 (×3): qty 1

## 2018-10-24 MED ORDER — METHYLPREDNISOLONE SODIUM SUCC 125 MG IJ SOLR
125.0000 mg | Freq: Once | INTRAMUSCULAR | Status: AC
  Administered 2018-10-24: 125 mg via INTRAVENOUS
  Filled 2018-10-24: qty 2

## 2018-10-24 MED ORDER — ALBUTEROL (5 MG/ML) CONTINUOUS INHALATION SOLN
10.0000 mg/h | INHALATION_SOLUTION | Freq: Once | RESPIRATORY_TRACT | Status: AC
  Administered 2018-10-24: 10 mg/h via RESPIRATORY_TRACT
  Filled 2018-10-24: qty 20

## 2018-10-24 MED ORDER — GUAIFENESIN 200 MG PO TABS
400.0000 mg | ORAL_TABLET | Freq: Two times a day (BID) | ORAL | Status: DC
  Administered 2018-10-25 – 2018-10-28 (×8): 400 mg via ORAL
  Filled 2018-10-24 (×8): qty 2

## 2018-10-24 MED ORDER — CYCLOBENZAPRINE HCL 10 MG PO TABS
5.0000 mg | ORAL_TABLET | Freq: Once | ORAL | Status: AC
  Administered 2018-10-24: 5 mg via ORAL
  Filled 2018-10-24: qty 1

## 2018-10-24 MED ORDER — INSULIN ASPART 100 UNIT/ML ~~LOC~~ SOLN
0.0000 [IU] | Freq: Three times a day (TID) | SUBCUTANEOUS | Status: DC
  Administered 2018-10-25: 1 [IU] via SUBCUTANEOUS
  Administered 2018-10-25 (×2): 2 [IU] via SUBCUTANEOUS
  Administered 2018-10-26: 1 [IU] via SUBCUTANEOUS
  Administered 2018-10-26: 3 [IU] via SUBCUTANEOUS
  Administered 2018-10-26: 2 [IU] via SUBCUTANEOUS
  Administered 2018-10-27: 1 [IU] via SUBCUTANEOUS
  Administered 2018-10-27 – 2018-10-28 (×3): 2 [IU] via SUBCUTANEOUS

## 2018-10-24 MED ORDER — LEVOFLOXACIN IN D5W 750 MG/150ML IV SOLN: 750.0000 mg | INTRAVENOUS | Status: DC

## 2018-10-24 NOTE — H&P (Signed)
H&P        History and Physical    Victoria Bird BSW:[id number] DOB: 12/03/1945 DOA: 10/24/2018  PCP: Hoyt Koch, MD  Patient coming from: home  I have personally briefly reviewed patient's old medical records in [place]  Chief Complaint: SOB  HPI: Victoria Bird is a 73 y.o. female with medical history significant of COPD, CKD, DM2 presets with SOB.  Patient has had URI symptoms for numerous days.  She knew that if she continued she would just get worse in the hospital along.  Last admission was February of last year for similar presentation.  Based patient uses nasal cannula.  She quit cigarettes back in February last year.  She denies any chest pain patient had a cough is been nonproductive.  She has low-grade fever. ED Course: Was found to have a pneumonia on imaging.  She was given IV Levaquin and Solu-Medrol in the ED.  Review of Systems:  + shortness of breath cough low-grade fever back pain all others reviewed with patient  and are  negative unless otherwise stated   Past Medical History:  Diagnosis Date  . Anxiety   . Chronic pain   . Colon polyps    adenomatous  . COPD (chronic obstructive pulmonary disease) ([place])   . Diabetes mellitus   . Diverticulitis   . Diverticulosis   . Emphysema lung ([place])   . Gall stones   . GERD (gastroesophageal reflux disease)   . H/O: pneumonia   . High cholesterol   . Hypertension   . OSA (obstructive sleep apnea)    no cpap  . Tracheobronchitis 01/01/2012    Past Surgical History:  Procedure Laterality Date  . ABDOMINAL HYSTERECTOMY    . APPENDECTOMY    . Bilateral shoulder surgery    . CARPAL TUNNEL RELEASE Left   . CATARACT EXTRACTION    . CHOLECYSTECTOMY    . COLONOSCOPY    . ESOPHAGOGASTRODUODENOSCOPY    . FRACTURE SURGERY    . Right knee surgery    . ROTATOR CUFF REPAIR       reports that she quit smoking about a year ago. Her smoking use included cigarettes. She started smoking about 44  years ago. She has a 61.50 pack-year smoking history. She has never used smokeless tobacco. She reports that she does not drink alcohol or use drugs.  Allergies  Allergen Reactions  . Aspartame And Phenylalanine Nausea And Vomiting    Patient says she is allergic to all artificial sweeteners  . Doxycycline Other (See Comments)    Nausea, vomiting, HA, double vision  . Tramadol Shortness Of Breath and Nausea Only  . Codeine Itching    Has not tried benadryl to alleviate side effects  . Neurontin [Gabapentin] Other (See Comments)    Dizzy, "drugged" feeling, sleepy  . Sulfa Antibiotics Other (See Comments)    Kidney problem   . Symbicort [Budesonide-Formoterol Fumarate]     Swelling of face and inside of mouth per pt  . Penicillins Rash    Has patient had a PCN reaction causing immediate rash, facial/tongue/throat swelling, SOB or lightheadedness with hypotension: No Has patient had a PCN reaction causing severe rash involving mucus membranes or skin necrosis: No Has patient had a PCN reaction that required hospitalization No Has patient had a PCN reaction occurring within the last 10 years: No If all of the above answers are "NO", then may proceed with Cephalosporin use.  . Prednisone Rash  Patient stated she received Prednisone while she was in the hospital and experienced a rash and "extreme pain.'    Family History  Problem Relation Age of Onset  . Heart attack Mother   . Diabetes Maternal Grandfather   . Heart attack Maternal Grandfather   . Hypertension Maternal Grandfather   . Stroke Maternal Grandfather   . Heart attack Maternal Grandmother   . Hypertension Maternal Grandmother   . Alcohol abuse Father   . Thyroid disease Neg Hx   . Lung disease Neg Hx   . Colon cancer Neg Hx   . Breast cancer Neg Hx     Prior to Admission medications   Medication Sig Start Date End Date Taking? Authorizing Provider  albuterol (ACCUNEB) 0.63 MG/3ML nebulizer solution Take 1 ampule  by nebulization every 6 (six) hours as needed for wheezing or shortness of breath.   Yes [provider]  albuterol (PROAIR HFA) 108 (90 Base) MCG/ACT inhaler Inhale 2 puffs into the lungs every 6 (six) hours as needed for wheezing or shortness of breath. 11/02/17  Yes Parrett, Tammy S, NP  amLODipine (NORVASC) 10 MG tablet Take 1 tablet (10 mg total) by mouth daily. 03/21/18  Yes Hoyt Koch, MD  cyclobenzaprine (FLEXERIL) 5 MG tablet TAKE 1 TABLET(5 MG) BY MOUTH THREE TIMES DAILY AS NEEDED FOR MUSCLE SPASMS Patient taking differently: Take 5 mg by mouth 3 (three) times daily as needed for muscle spasms.  08/22/18  Yes Hoyt Koch, MD  furosemide (LASIX) 20 MG tablet Take 1 tablet (20 mg total) by mouth daily. 05/27/18  Yes Parrett, Tammy S, NP  guaifenesin (HUMIBID E) 400 MG TABS tablet Take 400 mg by mouth 2 (two) times daily.   Yes [provider]  hydrALAZINE (APRESOLINE) 25 MG tablet Take 1 tablet (25 mg total) by mouth every 8 (eight) hours. 01/08/18 10/24/18 Yes Hoyt Koch, MD  labetalol (NORMODYNE) 100 MG tablet Take 1 tablet (100 mg total) by mouth 2 (two) times daily. 03/21/18  Yes Hoyt Koch, MD  metFORMIN (GLUCOPHAGE) 500 MG tablet TAKE 1 TABLET(500 MG) BY MOUTH TWICE DAILY WITH A MEAL Patient taking differently: Take 500 mg by mouth 2 (two) times daily with a meal.  07/08/18  Yes Hoyt Koch, MD  pantoprazole (PROTONIX) 40 MG tablet TAKE 1 TABLET(40 MG) BY MOUTH DAILY Patient taking differently: Take 40 mg by mouth daily.  07/05/18  Yes Hoyt Koch, MD  tiotropium (SPIRIVA HANDIHALER) 18 MCG inhalation capsule Place 1 capsule (18 mcg total) into inhaler and inhale daily. 04/13/17  Yes Javier Glazier, MD  blood glucose meter kit and supplies KIT Use to test blood sugar 08/30/17   Hoyt Koch, MD  furosemide (LASIX) 40 MG tablet Take 1 tablet (40 mg total) by mouth daily for 7 days. 04/10/18 04/17/18  Juanito Doom, MD  glucose blood (IGLUCOSE TEST STRIPS) test strip Use as instructed 08/30/17   Hoyt Koch, MD  Lancets MISC Use to test blood sugar one daily 08/30/17   Hoyt Koch, MD    Physical Exam: Vitals:   10/24/18 2130 10/24/18 2200 10/24/18 2229 10/24/18 2233  BP: 105/71 (!) 119/41 137/60   Pulse: 86 83 95   Resp: '19 18 20   ' Temp:   98.1 F (36.7 C)   TempSrc:   Oral   SpO2: 94% 93% 94%   Weight:    74.8 kg  Height:  Constitutional: NAD, calm, comfortable Vitals:   10/24/18 2130 10/24/18 2200 10/24/18 2229 10/24/18 2233  BP: 105/71 (!) 119/41 137/60   Pulse: 86 83 95   Resp: '19 18 20   ' Temp:   98.1 F (36.7 C)   TempSrc:   Oral   SpO2: 94% 93% 94%   Weight:    74.8 kg  Height:       Eyes: , lids and conjunctivae normal ENMT: Mucous membranes are moist. Posterior pharynx clear of any exudate or lesions.Normal dentition.  Neck: normal, supple, Respiratory:  Wheezes throughout  Normal respiratory effort. No accessory muscle use.  Cardiovascular: Regular rate and rhythm,  Abdomen: no tenderness, no masses palpated.  Bowel sounds positive.  Musculoskeletal: no clubbing / cyanosis.  Psychiatric: Normal judgment and insight. Alert and oriented x 3. Normal mood.     Labs on Admission: I have personally reviewed following labs and imaging studies  CBC: Recent Labs  Lab 10/24/18 1912  WBC 10.9*  NEUTROABS 7.9*  HGB 11.1*  HCT 35.6*  MCV 87.7  PLT 268   Basic Metabolic Panel: Recent Labs  Lab 10/24/18 1912  NA 138  K 3.7  CL 104  CO2 24  GLUCOSE 124*  BUN 27*  CREATININE 2.32*  CALCIUM 8.8*   GFR: Estimated Creatinine Clearance: 21.6 mL/min (A) (by C-G formula based on SCr of 2.32 mg/dL (H)). Liver Function Tests: No results for input(s): AST, ALT, ALKPHOS, BILITOT, PROT, ALBUMIN in the last 168 hours. No results for input(s): LIPASE, AMYLASE in the last 168 hours. No results for input(s): AMMONIA in the last 168  hours. Coagulation Profile: No results for input(s): INR, PROTIME in the last 168 hours. Cardiac Enzymes: No results for input(s): CKTOTAL, CKMB, CKMBINDEX, TROPONINI in the last 168 hours. BNP (last 3 results) Recent Labs    04/10/18 1715  PROBNP 37.0   HbA1C: No results for input(s): HGBA1C in the last 72 hours. CBG: Recent Labs  Lab 10/24/18 2343  GLUCAP 119*   Lipid Profile: No results for input(s): CHOL, HDL, LDLCALC, TRIG, CHOLHDL, LDLDIRECT in the last 72 hours. Thyroid Function Tests: No results for input(s): TSH, T4TOTAL, FREET4, T3FREE, THYROIDAB in the last 72 hours. Anemia Panel: No results for input(s): VITAMINB12, FOLATE, FERRITIN, TIBC, IRON, RETICCTPCT in the last 72 hours. Urine analysis:    Component Value Date/Time   COLORURINE YELLOW 03/02/2017 2245   APPEARANCEUR HAZY (A) 03/02/2017 2245   LABSPEC 1.008 03/02/2017 2245   PHURINE 5.0 03/02/2017 2245   GLUCOSEU NEGATIVE 03/02/2017 2245   HGBUR NEGATIVE 03/02/2017 2245   BILIRUBINUR NEGATIVE 03/02/2017 2245   KETONESUR NEGATIVE 03/02/2017 2245   PROTEINUR NEGATIVE 03/02/2017 2245   UROBILINOGEN 1.0 06/16/2015 1545   NITRITE NEGATIVE 03/02/2017 2245   LEUKOCYTESUR NEGATIVE 03/02/2017 2245    Radiological Exams on Admission: Dg Chest 2 View  Result Date: 10/24/2018 CLINICAL DATA:  Initial evaluation for acute shortness of breath, cough. EXAM: CHEST - 2 VIEW COMPARISON:  Prior radiograph from 04/10/2018 FINDINGS: Transverse heart size within normal limits. Mediastinal silhouette normal. Lungs normally inflated. Changes related underlying COPD. Patchy and hazy densities within the right mid and lower lung, suspicious for possible infiltrate given provided history. No edema or effusion. No pneumothorax. No acute osseous finding. IMPRESSION: 1. Patchy densities within the right mid and lower lung, suspicious for possible infectious infiltrates given provided history. 2. Underlying COPD. Electronically Signed    By: Jeannine Boga M.D.   On: 10/24/2018 19:50    EKG:  Independently reviewed.  Sinus rhythm rate of 89 bpm no acute ST or T wave abnormalities  Assessment/Plan Principal Problem:   PNA (pneumonia) Active Problems:   Diabetes mellitus type 2, controlled ([name])   Chronic respiratory failure with hypoxia ([name])   Hypertension   OSA (obstructive sleep apnea)   COPD exacerbation ([name])  Htn Acute on CKD   -Antibiotics per protocol, supportive care, pulmonary toilet.  Follow cultures.   -Bronchodilators, steroids, empiric antibiotics,  - cont home supplemental oxygen to achieve O2 sats greater than 90% - Home metformin, SSI, carb consistent diet, check A1c -Cont home CPAP -Baseline creatinine around 1.5-2 today 2.32.  Check urinalysis.  Avoid nephrotoxins continue IV fluids. -cont home meds for htn    DVT prophylaxis: Lovenox  Code Status: full  Disposition Plan: Home 2 days  Admission status: Obs med bed    Jaslen Adcox Johnson-Pitts MD [facility] Pager [phone]  If 7PM-7AM, please contact night-coverage [email or web address] Password [facility]  10/24/2018, 11:46 PM

## 2018-10-24 NOTE — ED Notes (Signed)
ED TO INPATIENT HANDOFF REPORT  Name/Age/Gender Victoria Bird 73 y.o. female  Code Status Code Status History    Date Active Date Inactive Code Status Order ID Comments User Context   11/08/2017 0531 11/20/2017 2233 Full Code [id number]  Etta Quill, DO ED   03/12/2017 2126 03/21/2017 2121 Full Code [id number]  Etta Quill, DO ED   03/03/2017 1205 03/03/2017 2059 Full Code [id number]  [name], MD Inpatient   12/16/2016 0351 12/19/2016 1945 Full Code [id number]  [name], MD Inpatient   06/08/2016 0459 06/11/2016 2015 Full Code [id number]  [name], MD Inpatient   03/10/2016 1038 03/20/2016 2154 Full Code [id number]  [name], MD Inpatient   12/25/2015 0505 12/31/2015 2012 Full Code [id number]  [name], MD Inpatient   12/12/2015 1302 12/18/2015 1630 Full Code [id number]  [name], MD ED   06/16/2015 2144 06/18/2015 1835 Full Code [id number]  [name], MD Inpatient   10/09/2014 0354 10/12/2014 2010 Full Code [id number]  [name], MD ED   04/10/2013 0602 04/12/2013 2034 Full Code [id number]  [name], MD ED   01/07/2013 2041 01/08/2013 1758 Full Code [id number]  Robbie Lis, MD Inpatient   12/25/2011 0408 01/01/2012 2000 Full Code [id number]  [name], RN Inpatient      Home/SNF/Other Home  Chief Complaint Shortness of Breath  Level of Care/Admitting Diagnosis ED Disposition    ED Disposition Condition [facility] Area: [facility] [[id number]]  Level of Care: Med-Surg [16]  Diagnosis: PNA (pneumonia) [267124]  Admitting Physician: Shelbie Proctor [[id number]]  Attending Physician: Shelbie Proctor [[id number]]  PT Class (Do Not Modify): Observation [104]  PT Acc Code (Do Not Modify): Observation [10022]       Medical History Past Medical History:  Diagnosis Date  . Anxiety   . Chronic pain   . Colon polyps    adenomatous  . COPD (chronic obstructive pulmonary disease)  ([place])   . Diabetes mellitus   . Diverticulitis   . Diverticulosis   . Emphysema lung ([place])   . Gall stones   . GERD (gastroesophageal reflux disease)   . H/O: pneumonia   . High cholesterol   . Hypertension   . OSA (obstructive sleep apnea)    no cpap  . Tracheobronchitis 01/01/2012    Allergies Allergies  Allergen Reactions  . Aspartame And Phenylalanine Nausea And Vomiting    Patient says she is allergic to all artificial sweeteners  . Doxycycline Other (See Comments)    Nausea, vomiting, HA, double vision  . Tramadol Shortness Of Breath and Nausea Only  . Codeine Itching    Has not tried benadryl to alleviate side effects  . Neurontin [Gabapentin] Other (See Comments)    Dizzy, "drugged" feeling, sleepy  . Sulfa Antibiotics Other (See Comments)    Kidney problem   . Symbicort [Budesonide-Formoterol Fumarate]     Swelling of face and inside of mouth per pt  . Penicillins Rash    Has patient had a PCN reaction causing immediate rash, facial/tongue/throat swelling, SOB or lightheadedness with hypotension: No Has patient had a PCN reaction causing severe rash involving mucus membranes or skin necrosis: No Has patient had a PCN reaction that required hospitalization No Has patient had a PCN reaction occurring within the last 10 years: No If all of the above answers are "NO", then may proceed with Cephalosporin use.  . Prednisone Rash    Patient  stated she received Prednisone while she was in the hospital and experienced a rash and "extreme pain.'    IV Location/Drains/Wounds Patient Lines/Drains/Airways Status   Active Line/Drains/Airways    Name:   Placement date:   Placement time:   Site:   Days:   Peripheral IV 10/24/18 Left Forearm   10/24/18    1932    Forearm   less than 1   External Urinary Catheter   11/10/17    0336    -   348          Labs/Imaging Results for orders placed or performed during the hospital encounter of 10/24/18 (from the past 48 hour(s))   Basic metabolic panel     Status: Abnormal   Collection Time: 10/24/18  7:12 PM  Result Value Ref Range   Sodium 138 135 - 145 mmol/L   Potassium 3.7 3.5 - 5.1 mmol/L   Chloride 104 98 - 111 mmol/L   CO2 24 22 - 32 mmol/L   Glucose, Bld 124 (H) 70 - 99 mg/dL   BUN 27 (H) 8 - 23 mg/dL   Creatinine, Ser 2.32 (H) 0.44 - 1.00 mg/dL   Calcium 8.8 (L) 8.9 - 10.3 mg/dL   GFR calc non Af Amer 20 (L) >60 mL/min   GFR calc Af Amer 24 (L) >60 mL/min   Anion gap 10 5 - 15    Comment: Performed at [facility], [place] [address] [place], [place] [address]  Brain natriuretic peptide     Status: None   Collection Time: 10/24/18  7:12 PM  Result Value Ref Range   B Natriuretic Peptide 45.6 0.0 - 100.0 pg/mL    Comment: Performed at [facility], [place] [address] [place], [place] [address]  CBC with Differential     Status: Abnormal   Collection Time: 10/24/18  7:12 PM  Result Value Ref Range   WBC 10.9 (H) 4.0 - 10.5 K/uL   RBC 4.06 3.87 - 5.11 MIL/uL   Hemoglobin 11.1 (L) 12.0 - 15.0 g/dL   HCT 35.6 (L) 36.0 - 46.0 %   MCV 87.7 80.0 - 100.0 fL   MCH 27.3 26.0 - 34.0 pg   MCHC 31.2 30.0 - 36.0 g/dL   RDW 13.2 11.5 - 15.5 %   Platelets 276 150 - 400 K/uL   nRBC 0.0 0.0 - 0.2 %   Neutrophils Relative % 74 %   Neutro Abs 7.9 (H) 1.7 - 7.7 K/uL   Lymphocytes Relative 16 %   Lymphs Abs 1.7 0.7 - 4.0 K/uL   Monocytes Relative 9 %   Monocytes Absolute 1.0 0.1 - 1.0 K/uL   Eosinophils Relative 1 %   Eosinophils Absolute 0.1 0.0 - 0.5 K/uL   Basophils Relative 0 %   Basophils Absolute 0.0 0.0 - 0.1 K/uL   Immature Granulocytes 0 %   Abs Immature Granulocytes 0.04 0.00 - 0.07 K/uL    Comment: Performed at [facility], [place] [address], [place], [place] [address]  Influenza panel by PCR (type A & B)     Status: None   Collection Time: 10/24/18  7:49 PM  Result Value Ref Range   Influenza A By PCR NEGATIVE NEGATIVE   Influenza B By PCR  NEGATIVE NEGATIVE    Comment: (NOTE) The Xpert Xpress Flu assay is intended as an aid in the diagnosis of  influenza and should not be used as a sole basis for treatment.  This  assay is FDA approved for nasopharyngeal swab specimens only. Nasal  washings and aspirates are unacceptable for Xpert Xpress Flu testing. Performed at [facility], [place] [address] [place], [place] [address]   I-stat troponin, ED     Status: None   Collection Time: 10/24/18  7:56 PM  Result Value Ref Range   Troponin i, poc 0.00 0.00 - 0.08 ng/mL   Comment 3            Comment: Due to the release kinetics of cTnI, a negative result within the first hours of the onset of symptoms does not rule out myocardial infarction with certainty. If myocardial infarction is still suspected, repeat the test at appropriate intervals.    Dg Chest 2 View  Result Date: 10/24/2018 CLINICAL DATA:  Initial evaluation for acute shortness of breath, cough. EXAM: CHEST - 2 VIEW COMPARISON:  Prior radiograph from 04/10/2018 FINDINGS: Transverse heart size within normal limits. Mediastinal silhouette normal. Lungs normally inflated. Changes related underlying COPD. Patchy and hazy densities within the right mid and lower lung, suspicious for possible infiltrate given provided history. No edema or effusion. No pneumothorax. No acute osseous finding. IMPRESSION: 1. Patchy densities within the right mid and lower lung, suspicious for possible infectious infiltrates given provided history. 2. Underlying COPD. Electronically Signed   By: Jeannine Boga M.D.   On: 10/24/2018 19:50    Pending Labs Unresulted Labs (From admission, onward)   None      Vitals/Pain Today's Vitals   10/24/18 2048 10/24/18 2100 10/24/18 2119 10/24/18 2130  BP: (!) 134/109 (!) 136/59 (!) 136/59 105/71  Pulse: 93 92 91 86  Resp: (!) 26 19 20 19   Temp:      TempSrc:      SpO2: 98% 98% 95% 94%  Height:      PainSc:         Isolation Precautions Droplet precaution  Medications Medications  levofloxacin (LEVAQUIN) IVPB 750 mg (750 mg Intravenous New Bag/Given 10/24/18 2046)  albuterol (PROVENTIL,VENTOLIN) solution continuous neb (10 mg/hr Nebulization Given 10/24/18 1927)  cyclobenzaprine (FLEXERIL) tablet 5 mg (5 mg Oral Given 10/24/18 2112)  methylPREDNISolone sodium succinate (SOLU-MEDROL) 125 mg/2 mL injection 125 mg (125 mg Intravenous Given 10/24/18 2124)    Mobility walks with person assist

## 2018-10-24 NOTE — ED Triage Notes (Signed)
Pt arrives by [facility] due to [facility], cough, and flu-like symptoms x3 days. Per EMS, wheezing and rhonchi heard in all lung fields. Pt has a hx of COPD, 2L O2 at home.   5mg  albuterol given by EMS.

## 2018-10-24 NOTE — Progress Notes (Signed)
PHARMACY NOTE:  ANTIMICROBIAL RENAL DOSAGE ADJUSTMENT  Current antimicrobial regimen includes a mismatch between antimicrobial dosage and estimated renal function.  As per policy approved by the Pharmacy & Therapeutics and Medical Executive Committees, the antimicrobial dosage will be adjusted accordingly.  Current antimicrobial dosage:  Levaquin 750 mg IV q24h  Indication: PNA  Renal Function:  Estimated Creatinine Clearance: 21.6 mL/min (A) (by C-G formula based on SCr of 2.32 mg/dL (H)). []      On intermittent HD, scheduled: []      On CRRT    Antimicrobial dosage has been changed to:  Levaquin 750 mg IV q48h     Thank you for allowing pharmacy to be a part of this patient's care.  Dorrene German, [facility] 10/24/2018 11:38 PM

## 2018-10-24 NOTE — ED Notes (Signed)
Bed: WA17 Expected date:  Expected time:  Means of arrival:  Comments: EMS 70s flu [facility]

## 2018-10-24 NOTE — ED Provider Notes (Signed)
[facility] DEPT Provider Note   CSN: [id number] Arrival date & time: 10/24/18  1902     History   Chief Complaint Chief Complaint  Patient presents with  . Shortness of Breath  . Cough  . Chills  . Generalized Body Aches    HPI Victoria Bird is a 73 y.o. female with history of COPD, diabetes, AAA, hypertension hyperlipidemia on home O2, nasal cannula 2 L, presenting today via EMS for a 3-day history of flulike symptoms with cough, congestion, rhinorrhea, subjective fever and shortness of breath.  Patient with history of COPD and states that she has not taken her COPD medications in quite some time due to "not needing them".  Patient states that she has had increased wheezing/shortness of breath since onset of her symptoms.  She endorses cough with green/yellow sputum production x3 days.  She denies hemoptysis or chest pain.  Patient received 5 mg of albuterol by EMS prior to arrival with some improvement of wheezing however patient is still with subjective shortness of breath.  HPI  Past Medical History:  Diagnosis Date  . Anxiety   . Chronic pain   . Colon polyps    adenomatous  . COPD (chronic obstructive pulmonary disease) ([place])   . Diabetes mellitus   . Diverticulitis   . Diverticulosis   . Emphysema lung ([place])   . Gall stones   . GERD (gastroesophageal reflux disease)   . H/O: pneumonia   . High cholesterol   . Hypertension   . OSA (obstructive sleep apnea)    no cpap  . Tracheobronchitis 01/01/2012    Patient Active Problem List   Diagnosis Date Noted  . Neck pain 07/29/2018  . Right lower quadrant abdominal pain 07/29/2018  . Swallowing problem 07/29/2018  . Lung nodules 04/18/2018  . Chronic respiratory failure with hypoxia ([place]) 04/18/2018  . Diastolic CHF ([place]) 77/82/4235  . COPD with acute exacerbation ([place]) 11/08/2017  . Vertigo 03/03/2017  . Fibromyalgia 01/18/2017  . Tobacco abuse 12/17/2016  . CKD (chronic  kidney disease), stage III ([place]) 12/15/2016  . AAA (abdominal aortic aneurysm) without rupture ([place]) 06/08/2016  . Right-sided low back pain without sciatica   . Adjustment disorder with mixed anxiety and depressed mood 02/25/2016  . Overweight (BMI 25.0-29.9) 01/25/2016  . Allergic rhinitis 11/17/2015  . Tobacco use disorder 06/22/2015  . Diabetes mellitus type 2, controlled ([place]) 06/16/2015  . H/O recurrent pneumonia 05/27/2015  . GERD (gastroesophageal reflux disease) 05/27/2015  . Hyperlipidemia 05/27/2015  . Chronic pain syndrome 05/27/2015  . Thyroid nodule 01/20/2015  . COPD (chronic obstructive pulmonary disease) ([place]) 10/09/2014  . Hypertension 12/14/2011  . OSA (obstructive sleep apnea) 10/02/2008    Past Surgical History:  Procedure Laterality Date  . ABDOMINAL HYSTERECTOMY    . APPENDECTOMY    . Bilateral shoulder surgery    . CARPAL TUNNEL RELEASE Left   . CATARACT EXTRACTION    . CHOLECYSTECTOMY    . COLONOSCOPY    . ESOPHAGOGASTRODUODENOSCOPY    . FRACTURE SURGERY    . Right knee surgery    . ROTATOR CUFF REPAIR       OB History   No obstetric history on file.      Home Medications    Prior to Admission medications   Medication Sig Start Date End Date Taking? Authorizing Provider  albuterol (ACCUNEB) 0.63 MG/3ML nebulizer solution Take 1 ampule by nebulization every 6 (six) hours as needed for wheezing or shortness of breath.  [provider]  albuterol (PROAIR HFA) 108 (90 Base) MCG/ACT inhaler Inhale 2 puffs into the lungs every 6 (six) hours as needed for wheezing or shortness of breath. 11/02/17   Parrett, Fonnie Mu, NP  amLODipine (NORVASC) 10 MG tablet Take 1 tablet (10 mg total) by mouth daily. 03/21/18   Hoyt Koch, MD  blood glucose meter kit and supplies KIT Use to test blood sugar 08/30/17   Hoyt Koch, MD  cyclobenzaprine (FLEXERIL) 5 MG tablet TAKE 1 TABLET(5 MG) BY MOUTH THREE TIMES DAILY AS NEEDED FOR MUSCLE  SPASMS 08/22/18   Hoyt Koch, MD  furosemide (LASIX) 20 MG tablet Take 1 tablet (20 mg total) by mouth daily. 05/27/18   Parrett, Fonnie Mu, NP  furosemide (LASIX) 40 MG tablet Take 1 tablet (40 mg total) by mouth daily for 7 days. 04/10/18 04/17/18  Juanito Doom, MD  glucose blood (IGLUCOSE TEST STRIPS) test strip Use as instructed 08/30/17   Hoyt Koch, MD  hydrALAZINE (APRESOLINE) 25 MG tablet Take 1 tablet (25 mg total) by mouth every 8 (eight) hours. 01/08/18 02/07/18  Hoyt Koch, MD  hydrALAZINE (APRESOLINE) 25 MG tablet Take 25 mg by mouth every 8 (eight) hours.    [provider]  labetalol (NORMODYNE) 100 MG tablet Take 1 tablet (100 mg total) by mouth 2 (two) times daily. 03/21/18   Hoyt Koch, MD  Lancets MISC Use to test blood sugar one daily 08/30/17   Hoyt Koch, MD  metFORMIN (GLUCOPHAGE) 500 MG tablet TAKE 1 TABLET(500 MG) BY MOUTH TWICE DAILY WITH A MEAL 07/08/18   Hoyt Koch, MD  pantoprazole (PROTONIX) 40 MG tablet TAKE 1 TABLET(40 MG) BY MOUTH DAILY 07/05/18   Hoyt Koch, MD  tiotropium (SPIRIVA HANDIHALER) 18 MCG inhalation capsule Place 1 capsule (18 mcg total) into inhaler and inhale daily. 04/13/17   Javier Glazier, MD    Family History Family History  Problem Relation Age of Onset  . Heart attack Mother   . Diabetes Maternal Grandfather   . Heart attack Maternal Grandfather   . Hypertension Maternal Grandfather   . Stroke Maternal Grandfather   . Heart attack Maternal Grandmother   . Hypertension Maternal Grandmother   . Alcohol abuse Father   . Thyroid disease Neg Hx   . Lung disease Neg Hx   . Colon cancer Neg Hx   . Breast cancer Neg Hx     Social History Social History   Tobacco Use  . Smoking status: Former Smoker    Packs/day: 1.50    Years: 41.00    Pack years: 61.50    Types: Cigarettes    Start date: 03/19/1974    Last attempt to quit: 11/06/2017    Years since  quitting: 0.9  . Smokeless tobacco: Never Used  . Tobacco comment: daily amount of cigarettes varies depending on stress level   Substance Use Topics  . Alcohol use: No    Alcohol/week: 0.0 standard drinks  . Drug use: No     Allergies   Aspartame and phenylalanine; Doxycycline; Tramadol; Codeine; Neurontin [gabapentin]; Sulfa antibiotics; Symbicort [budesonide-formoterol fumarate]; Penicillins; and Prednisone   Review of Systems Review of Systems  Constitutional: Positive for fever (Subjective). Negative for chills.  HENT: Positive for congestion and rhinorrhea. Negative for sore throat and trouble swallowing.   Respiratory: Positive for cough and shortness of breath.   Cardiovascular: Negative.  Negative for chest pain and leg swelling.  Gastrointestinal:  Negative.  Negative for abdominal pain, diarrhea, nausea and vomiting.  Genitourinary: Negative.  Negative for dysuria and hematuria.  Musculoskeletal: Negative.  Negative for arthralgias and myalgias.  Neurological: Negative.  Negative for dizziness, syncope, weakness and headaches.  All other systems reviewed and are negative.  Physical Exam Updated Vital Signs BP (!) 141/66   Pulse 86   Temp 98.6 F (37 C) (Oral)   Resp 17   Ht _0  (1.6 m)   SpO2 96%   BMI 31.35 kg/m   Physical Exam Constitutional:      General: She is not in acute distress.    Appearance: She is well-developed. She is not ill-appearing or diaphoretic.  HENT:     Head: Normocephalic and atraumatic.     Right Ear: External ear normal.     Left Ear: External ear normal.     Nose: Nose normal.     Mouth/Throat:     Mouth: Mucous membranes are moist.     Pharynx: Oropharynx is clear.  Eyes:     Pupils: Pupils are equal, round, and reactive to light.  Neck:     Musculoskeletal: Normal range of motion.     Trachea: Trachea normal. No tracheal deviation.  Cardiovascular:     Rate and Rhythm: Normal rate and regular rhythm.     Pulses:           Dorsalis pedis pulses are 2+ on the right side and 2+ on the left side.       Posterior tibial pulses are 2+ on the right side and 2+ on the left side.  Pulmonary:     Effort: Pulmonary effort is normal. No accessory muscle usage or respiratory distress.     Breath sounds: Wheezing and rhonchi present.  Abdominal:     General: Bowel sounds are normal.     Palpations: Abdomen is soft. There is no pulsatile mass.     Tenderness: There is no abdominal tenderness. There is no guarding or rebound.  Musculoskeletal: Normal range of motion.     Right lower leg: She exhibits no tenderness. No edema.     Left lower leg: She exhibits no tenderness. No edema.  Skin:    General: Skin is warm and dry.     Capillary Refill: Capillary refill takes less than 2 seconds.  Neurological:     Mental Status: She is alert.     GCS: GCS eye subscore is 4. GCS verbal subscore is 5. GCS motor subscore is 6.  Psychiatric:        Mood and Affect: Mood normal.        Behavior: Behavior normal.    ED Treatments / Results  Labs (all labs ordered are listed, but only abnormal results are displayed) Labs Reviewed  BASIC METABOLIC PANEL - Abnormal; Notable for the following components:      Result Value   Glucose, Bld 124 (*)    BUN 27 (*)    Creatinine, Ser 2.32 (*)    Calcium 8.8 (*)    GFR calc non Af Amer 20 (*)    GFR calc Af Amer 24 (*)    All other components within normal limits  CBC WITH DIFFERENTIAL/PLATELET - Abnormal; Notable for the following components:   WBC 10.9 (*)    Hemoglobin 11.1 (*)    HCT 35.6 (*)    Neutro Abs 7.9 (*)    All other components within normal limits  INFLUENZA PANEL BY PCR (TYPE A &  B)  BRAIN NATRIURETIC PEPTIDE  I-STAT TROPONIN, ED    EKG EKG Interpretation  Date/Time:  Thursday October 24 2018 19:14:39 EST Ventricular Rate:  89 PR Interval:    QRS Duration: 73 QT Interval:  348 QTC Calculation: 424 R Axis:   47 Text Interpretation:  Sinus rhythm When  compared with ECG of 11/10/2017 Rate slower Otherwise no significant change Confirmed by Francine Graven [id number]) on 10/24/2018 7:52:56 PM   Radiology Dg Chest 2 View  Result Date: 10/24/2018 CLINICAL DATA:  Initial evaluation for acute shortness of breath, cough. EXAM: CHEST - 2 VIEW COMPARISON:  Prior radiograph from 04/10/2018 FINDINGS: Transverse heart size within normal limits. Mediastinal silhouette normal. Lungs normally inflated. Changes related underlying COPD. Patchy and hazy densities within the right mid and lower lung, suspicious for possible infiltrate given provided history. No edema or effusion. No pneumothorax. No acute osseous finding. IMPRESSION: 1. Patchy densities within the right mid and lower lung, suspicious for possible infectious infiltrates given provided history. 2. Underlying COPD. Electronically Signed   By: Jeannine Boga M.D.   On: 10/24/2018 19:50    Procedures Procedures (including critical care time)  Medications Ordered in ED Medications  levofloxacin (LEVAQUIN) IVPB 750 mg (750 mg Intravenous New Bag/Given 10/24/18 2046)  albuterol (PROVENTIL,VENTOLIN) solution continuous neb (10 mg/hr Nebulization Given 10/24/18 1927)     Initial Impression / Assessment and Plan / ED Course  I have reviewed the triage vital signs and the nursing notes.  Pertinent labs & imaging results that were available during my care of the patient were reviewed by me and considered in my medical decision making (see chart for details).    73 year old with history of COPD presenting with 3-day history of URI-like illness with productive cough.  Patient given albuterol prior to arrival by EMS with some improvement of symptoms.  Patient denies recent admissions/hospitalizations. On arrival patient with wheezing, continuous DuoNeb ordered.  Initial troponin negative CBC with mild leukocytosis BMP with creatinine of 2.32 slightly increased from baseline EKG without acute changes  confirmed by Dr. Thurnell Garbe Chest x-ray  IMPRESSION:  1. Patchy densities within the right mid and lower lung, suspicious  for possible infectious infiltrates given provided history.  2. Underlying COPD.  ------------------- Case Discussed with Dr. Thurnell Garbe, IV antibiotics, Levaquin ordered due to patient's penicillin allergy.  Will seek admission. ------------------ Patient reassessed, states that she is feeling somewhat improved following nebulizer treatment today.  States understanding of care plan and is agreeable for admission.  Vital signs stable, patient on nasal cannula without hypoxia or tachycardia. --------------- Discussed case with hospitalist who will see patient in emergency department for admission.  Solu-Medrol ordered, patient denies allergy to Solu-Medrol. -------------- Patient has been admitted to the hospital for further evaluation treatment.  Note: Portions of this report may have been transcribed using voice recognition software. Every effort was made to ensure accuracy; however, inadvertent computerized transcription errors may still be present. Final Clinical Impressions(s) / ED Diagnoses   Final diagnoses:  Community acquired pneumonia of right lung, unspecified part of lung  COPD exacerbation [name])    ED Discharge Orders    None       Gari Crown 10/24/18 2218    Francine Graven, DO 10/28/18 1354

## 2018-10-25 DIAGNOSIS — J189 Pneumonia, unspecified organism: Secondary | ICD-10-CM | POA: Diagnosis not present

## 2018-10-25 LAB — HEMOGLOBIN A1C
Hgb A1c MFr Bld: 5.4 % (ref 4.8–5.6)
Mean Plasma Glucose: 108.28 mg/dL

## 2018-10-25 LAB — GLUCOSE, CAPILLARY
GLUCOSE-CAPILLARY: 139 mg/dL — AB (ref 70–99)
Glucose-Capillary: 165 mg/dL — ABNORMAL HIGH (ref 70–99)
Glucose-Capillary: 188 mg/dL — ABNORMAL HIGH (ref 70–99)
Glucose-Capillary: 164 mg/dL — ABNORMAL HIGH (ref 70–99)

## 2018-10-25 LAB — STREP PNEUMONIAE URINARY ANTIGEN: Strep Pneumo Urinary Antigen: NEGATIVE

## 2018-10-25 MED ORDER — AZITHROMYCIN 250 MG PO TABS
500.0000 mg | ORAL_TABLET | ORAL | Status: AC
  Administered 2018-10-25 – 2018-10-27 (×3): 500 mg via ORAL
  Filled 2018-10-25 (×3): qty 2

## 2018-10-25 MED ORDER — FUROSEMIDE 20 MG PO TABS
20.0000 mg | ORAL_TABLET | Freq: Every day | ORAL | Status: DC
  Administered 2018-10-25 – 2018-10-28 (×4): 20 mg via ORAL
  Filled 2018-10-25 (×4): qty 1

## 2018-10-25 MED ORDER — IPRATROPIUM-ALBUTEROL 0.5-2.5 (3) MG/3ML IN SOLN
3.0000 mL | Freq: Three times a day (TID) | RESPIRATORY_TRACT | Status: DC
  Administered 2018-10-26 – 2018-10-28 (×7): 3 mL via RESPIRATORY_TRACT
  Filled 2018-10-25 (×7): qty 3

## 2018-10-25 MED ORDER — TIOTROPIUM BROMIDE MONOHYDRATE 18 MCG IN CAPS: 18.0000 ug | ORAL_CAPSULE | Freq: Every day | RESPIRATORY_TRACT | Status: DC

## 2018-10-25 MED ORDER — SODIUM CHLORIDE 0.9 % IV SOLN
1.0000 g | INTRAVENOUS | Status: DC
  Administered 2018-10-25 – 2018-10-28 (×4): 1 g via INTRAVENOUS
  Filled 2018-10-25 (×4): qty 1

## 2018-10-25 NOTE — Progress Notes (Signed)
PROGRESS NOTE    Victoria Bird  DQQ:[id number] DOB: 1946/09/15 DOA: 10/24/2018 PCP: Hoyt Koch, MD   Brief Narrative:  73 year old with past medical history relevant for hypertension, COPD on 2 to 3 L nasal cannula, stage III CKD, type 2 diabetes coming in with shortness of breath and found to have COPD exacerbation secondary to community-acquired pneumonia.   Assessment & Plan:   Principal Problem:   PNA (pneumonia) Active Problems:   Hypertension   OSA (obstructive sleep apnea)   Diabetes mellitus type 2, controlled ([name])   COPD exacerbation ([name])   Chronic respiratory failure with hypoxia ([name])   #) COPD exacerbation due to community-acquired pneumonia: Patient is still quite tachypneic and short of breath and wheezing.  She is intermittently requiring increasing amounts of oxygen. - Continue short acting bronchodilators every 6 hours and as needed -Continue L AMA - Continue IV ceftriaxone and azithromycin started 10/25/2018 -Influenza panel negative -Blood cultures 10/24/2018 no growth to date IV methylprednisolone 40 mg every 12  #) Obstructive sleep apnea: -Continue CPAP  #) Stage III CKD: Patient's creatinine is slightly worse today but it is unclear if this is dramatically different than her baseline.  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 -Hold nephrotoxins  #) Type 2 diabetes: Patient will likely not be able to be discharged on metformin due to her degree of CKD -Sliding scale insulin, AC at bedtime -Hold metformin  #) Hypertension: -Continue amlodipine 10 mg daily - Continue labetalol 200 mg twice daily -Continue hydralazine 25 mg 3 times daily  #) Lower extremity edema: -Continue furosemide 20 mg daily  Fluids: Tolerating p.o. Electrolytes: Monitor and supplement Nutrition: Heart healthy diet/carb restricted  Prophylaxis: Enoxaparin   Disposition: Pending improved respiratory status  Full code    Consultants:    None  Procedures:   None  Antimicrobials:   IV ceftriaxone started 10/25/2018  P.o. azithromycin started 10/25/2018   Subjective: Patient reports that her breathing is somewhat better.  She continues to be short of breath when she gets up and requires more oxygen.  She denies any nausea, vomiting, diarrhea, cough, congestion, rhinorrhea.   Objective: Vitals:   10/25/18 0148 10/25/18 0456 10/25/18 0847 10/25/18 0928  BP:  (!) 138/58  (!) 149/62  Pulse:  79  96  Resp:    (!) 24  Temp:  98.3 F (36.8 C)  98.7 F (37.1 C)  TempSrc:  Oral    SpO2: 98% 96% 94% 96%  Weight:      Height:        Intake/Output Summary (Last 24 hours) at 10/25/2018 1023 Last data filed at 10/25/2018 0641 Gross per 24 hour  Intake 368.93 ml  Output 400 ml  Net -31.07 ml   Filed Weights   10/24/18 2233  Weight: 74.8 kg    Examination:  General exam: Mildly uncomfortable appearing Respiratory system: Moderately increased work of breathing, diffuse rhonchi, intermittent expiratory wheezing worse on the left, no crackles Cardiovascular system: Regular rate and rhythm, no murmurs Gastrointestinal system: Soft, nondistended, no rebound or guarding, plus bowel sounds Central nervous system: Alert and oriented.  Grossly intact, moving all extremities Extremities: Plus lower extremity edema. Skin: No rashes over visible skin Psychiatry: Judgement and insight appear normal. Mood & affect appropriate.     Data Reviewed: I have personally reviewed following labs and imaging studies  CBC: Recent Labs  Lab 10/24/18 1912  WBC 10.9*  NEUTROABS 7.9*  HGB 11.1*  HCT 35.6*  MCV 87.7  PLT 440   Basic Metabolic Panel: Recent Labs  Lab 10/24/18 1912  NA 138  K 3.7  CL 104  CO2 24  GLUCOSE 124*  BUN 27*  CREATININE 2.32*  CALCIUM 8.8*   GFR: Estimated Creatinine Clearance: 21.6 mL/min (A) (by C-G formula based on SCr of 2.32 mg/dL (H)). Liver Function Tests: No results for input(s):  AST, ALT, ALKPHOS, BILITOT, PROT, ALBUMIN in the last 168 hours. No results for input(s): LIPASE, AMYLASE in the last 168 hours. No results for input(s): AMMONIA in the last 168 hours. Coagulation Profile: No results for input(s): INR, PROTIME in the last 168 hours. Cardiac Enzymes: No results for input(s): CKTOTAL, CKMB, CKMBINDEX, TROPONINI in the last 168 hours. BNP (last 3 results) Recent Labs    04/10/18 1715  PROBNP 37.0   HbA1C: Recent Labs    10/25/18 0021  HGBA1C 5.4   CBG: Recent Labs  Lab 10/24/18 2343 10/25/18 0735  GLUCAP 119* 139*   Lipid Profile: No results for input(s): CHOL, HDL, LDLCALC, TRIG, CHOLHDL, LDLDIRECT in the last 72 hours. Thyroid Function Tests: No results for input(s): TSH, T4TOTAL, FREET4, T3FREE, THYROIDAB in the last 72 hours. Anemia Panel: No results for input(s): VITAMINB12, FOLATE, FERRITIN, TIBC, IRON, RETICCTPCT in the last 72 hours. Sepsis Labs: No results for input(s): PROCALCITON, LATICACIDVEN in the last 168 hours.  No results found for this or any previous visit (from the past 240 hour(s)).       Radiology Studies: Dg Chest 2 View  Result Date: 10/24/2018 CLINICAL DATA:  Initial evaluation for acute shortness of breath, cough. EXAM: CHEST - 2 VIEW COMPARISON:  Prior radiograph from 04/10/2018 FINDINGS: Transverse heart size within normal limits. Mediastinal silhouette normal. Lungs normally inflated. Changes related underlying COPD. Patchy and hazy densities within the right mid and lower lung, suspicious for possible infiltrate given provided history. No edema or effusion. No pneumothorax. No acute osseous finding. IMPRESSION: 1. Patchy densities within the right mid and lower lung, suspicious for possible infectious infiltrates given provided history. 2. Underlying COPD. Electronically Signed   By: Jeannine Boga M.D.   On: 10/24/2018 19:50        Scheduled Meds: . amLODipine  10 mg Oral Daily  . azithromycin   500 mg Oral Q24H  . enoxaparin (LOVENOX) injection  30 mg Subcutaneous QHS  . furosemide  20 mg Oral Daily  . guaiFENesin  400 mg Oral BID  . hydrALAZINE  25 mg Oral Q8H  . insulin aspart  0-5 Units Subcutaneous QHS  . insulin aspart  0-9 Units Subcutaneous TID WC  . ipratropium-albuterol  3 mL Nebulization Q6H  . labetalol  100 mg Oral BID  . methylPREDNISolone (SOLU-MEDROL) injection  40 mg Intravenous Q12H  . pantoprazole  40 mg Oral Daily   Continuous Infusions: . cefTRIAXone (ROCEPHIN)  IV 1 g (10/25/18 0814)     LOS: 0 days    Time spent: [place], MD [facility]  If 7PM-7AM, please contact night-coverage [email or web address] Password [facility] 10/25/2018, 10:23 AM

## 2018-10-26 ENCOUNTER — Observation Stay (HOSPITAL_COMMUNITY): Payer: Medicare Other

## 2018-10-26 DIAGNOSIS — R0602 Shortness of breath: Secondary | ICD-10-CM | POA: Diagnosis not present

## 2018-10-26 DIAGNOSIS — Z888 Allergy status to other drugs, medicaments and biological substances status: Secondary | ICD-10-CM | POA: Diagnosis not present

## 2018-10-26 DIAGNOSIS — J9611 Chronic respiratory failure with hypoxia: Secondary | ICD-10-CM | POA: Diagnosis present

## 2018-10-26 DIAGNOSIS — R600 Localized edema: Secondary | ICD-10-CM | POA: Diagnosis present

## 2018-10-26 DIAGNOSIS — Z794 Long term (current) use of insulin: Secondary | ICD-10-CM | POA: Diagnosis not present

## 2018-10-26 DIAGNOSIS — J439 Emphysema, unspecified: Secondary | ICD-10-CM | POA: Diagnosis present

## 2018-10-26 DIAGNOSIS — Z880 Allergy status to penicillin: Secondary | ICD-10-CM | POA: Diagnosis not present

## 2018-10-26 DIAGNOSIS — Z87891 Personal history of nicotine dependence: Secondary | ICD-10-CM | POA: Diagnosis not present

## 2018-10-26 DIAGNOSIS — N183 Chronic kidney disease, stage 3 (moderate): Secondary | ICD-10-CM | POA: Diagnosis present

## 2018-10-26 DIAGNOSIS — E1122 Type 2 diabetes mellitus with diabetic chronic kidney disease: Secondary | ICD-10-CM | POA: Diagnosis present

## 2018-10-26 DIAGNOSIS — E7800 Pure hypercholesterolemia, unspecified: Secondary | ICD-10-CM | POA: Diagnosis present

## 2018-10-26 DIAGNOSIS — Z8701 Personal history of pneumonia (recurrent): Secondary | ICD-10-CM | POA: Diagnosis not present

## 2018-10-26 DIAGNOSIS — Z9981 Dependence on supplemental oxygen: Secondary | ICD-10-CM | POA: Diagnosis not present

## 2018-10-26 DIAGNOSIS — I129 Hypertensive chronic kidney disease with stage 1 through stage 4 chronic kidney disease, or unspecified chronic kidney disease: Secondary | ICD-10-CM | POA: Diagnosis present

## 2018-10-26 DIAGNOSIS — Z881 Allergy status to other antibiotic agents status: Secondary | ICD-10-CM | POA: Diagnosis not present

## 2018-10-26 DIAGNOSIS — E785 Hyperlipidemia, unspecified: Secondary | ICD-10-CM | POA: Diagnosis present

## 2018-10-26 DIAGNOSIS — Z885 Allergy status to narcotic agent status: Secondary | ICD-10-CM | POA: Diagnosis not present

## 2018-10-26 DIAGNOSIS — J189 Pneumonia, unspecified organism: Secondary | ICD-10-CM | POA: Diagnosis present

## 2018-10-26 DIAGNOSIS — G4733 Obstructive sleep apnea (adult) (pediatric): Secondary | ICD-10-CM | POA: Diagnosis present

## 2018-10-26 DIAGNOSIS — Z79899 Other long term (current) drug therapy: Secondary | ICD-10-CM | POA: Diagnosis not present

## 2018-10-26 DIAGNOSIS — J441 Chronic obstructive pulmonary disease with (acute) exacerbation: Secondary | ICD-10-CM | POA: Diagnosis present

## 2018-10-26 DIAGNOSIS — Z882 Allergy status to sulfonamides status: Secondary | ICD-10-CM | POA: Diagnosis not present

## 2018-10-26 LAB — RENAL FUNCTION PANEL
Albumin: 3.2 g/dL — ABNORMAL LOW (ref 3.5–5.0)
Anion gap: 9 (ref 5–15)
BUN: 42 mg/dL — ABNORMAL HIGH (ref 8–23)
CO2: 24 mmol/L (ref 22–32)
Calcium: 9 mg/dL (ref 8.9–10.3)
Chloride: 107 mmol/L (ref 98–111)
Creatinine, Ser: 2.49 mg/dL — ABNORMAL HIGH (ref 0.44–1.00)
GFR calc Af Amer: 22 mL/min — ABNORMAL LOW (ref >60)
GFR calc non Af Amer: 19 mL/min — ABNORMAL LOW (ref >60)
Glucose, Bld: 165 mg/dL — ABNORMAL HIGH (ref 70–99)
Phosphorus: 3.9 mg/dL (ref 2.5–4.6)
Potassium: 4.6 mmol/L (ref 3.5–5.1)
Sodium: 140 mmol/L (ref 135–145)

## 2018-10-26 LAB — GLUCOSE, CAPILLARY
Glucose-Capillary: 146 mg/dL — ABNORMAL HIGH (ref 70–99)
Glucose-Capillary: 209 mg/dL — ABNORMAL HIGH (ref 70–99)
Glucose-Capillary: 155 mg/dL — ABNORMAL HIGH (ref 70–99)
Glucose-Capillary: 185 mg/dL — ABNORMAL HIGH (ref 70–99)

## 2018-10-26 LAB — CBC
HCT: 34.5 % — ABNORMAL LOW (ref 36.0–46.0)
Hemoglobin: 10.7 g/dL — ABNORMAL LOW (ref 12.0–15.0)
MCH: 26.6 pg (ref 26.0–34.0)
MCHC: 31 g/dL (ref 30.0–36.0)
MCV: 85.8 fL (ref 80.0–100.0)
Platelets: 304 10*3/uL (ref 150–400)
RBC: 4.02 MIL/uL (ref 3.87–5.11)
RDW: 13.2 % (ref 11.5–15.5)
WBC: 13 10*3/uL — ABNORMAL HIGH (ref 4.0–10.5)
nRBC: 0 % (ref 0.0–0.2)

## 2018-10-26 LAB — MAGNESIUM: Magnesium: 1.8 mg/dL (ref 1.7–2.4)

## 2018-10-26 NOTE — Plan of Care (Signed)
  Problem: Nutrition: Goal: Adequate nutrition will be maintained Outcome: Progressing   Problem: Pain Managment: Goal: General experience of comfort will improve Outcome: Progressing   Problem: Safety: Goal: Ability to remain free from injury will improve Outcome: Progressing   

## 2018-10-26 NOTE — Progress Notes (Signed)
PROGRESS NOTE    Victoria Bird  VZC:[id number] DOB: 15-May-1946 DOA: 10/24/2018 PCP: Hoyt Koch, MD   Brief Narrative:  73 year old with past medical history relevant for hypertension, COPD on 2 to 3 L nasal cannula, stage III CKD, type 2 diabetes coming in with shortness of breath and found to have COPD exacerbation secondary to community-acquired pneumonia.   Assessment & Plan:   Principal Problem:   PNA (pneumonia) Active Problems:   Hypertension   OSA (obstructive sleep apnea)   Diabetes mellitus type 2, controlled ([name])   COPD exacerbation ([name])   Chronic respiratory failure with hypoxia ([name])   #) COPD exacerbation due to community-acquired pneumonia: Patient continues to be tachypneic and short of breath.  She continues to be wheezing profoundly.  She is short of breath even with ambulation. - Continue short acting bronchodilators every 6 hours and as needed -Continue L AMA - Continue IV ceftriaxone and azithromycin started 10/25/2018 -Influenza panel negative -Blood cultures 10/24/2018 no growth to date -IV methylprednisolone 40 mg every 12  #) Obstructive sleep apnea: -Continue CPAP  #) Stage III CKD: This is stabilized. -Hold nephrotoxins  #) Type 2 diabetes: Patient will likely not be able to be discharged on metformin due to her degree of CKD.  Regardless her hemoglobin A1c is 5.4 and it is not clear that she needs active treatment for her diabetes at this time. -Sliding scale insulin, AC at bedtime -Discontinue metformin  #) Hypertension: -Continue amlodipine 10 mg daily - Continue labetalol 200 mg twice daily -Continue hydralazine 25 mg 3 times daily  #) Lower extremity edema: -Continue furosemide 20 mg daily  Fluids: Tolerating p.o. Electrolytes: Monitor and supplement Nutrition: Heart healthy diet/carb restricted  Prophylaxis: Enoxaparin   Disposition: Pending improved respiratory status  Full code    Consultants:    None  Procedures:   None  Antimicrobials:   IV ceftriaxone started 10/25/2018  P.o. azithromycin started 10/25/2018   Subjective: Patient reports that she is feeling quite poorly.  She continues to be tachypneic and have several word dyspnea.  She has shortness of breath with even minimal ambulation.  She denies any nausea, vomiting, diarrhea.  She has not had any chest pain.  Objective: Vitals:   10/25/18 2008 10/25/18 2031 10/26/18 0525 10/26/18 0753  BP:  138/77 128/60   Pulse:  87 74 98  Resp:  (!) 22 16 20   Temp:  98.1 F (36.7 C) 97.9 F (36.6 C)   TempSrc:  Oral Oral   SpO2: 97% 96% 96% 97%  Weight:   76.7 kg   Height:        Intake/Output Summary (Last 24 hours) at 10/26/2018 1054 Last data filed at 10/26/2018 0600 Gross per 24 hour  Intake 580 ml  Output 850 ml  Net -270 ml   Filed Weights   10/24/18 2233 10/26/18 0525  Weight: 74.8 kg 76.7 kg    Examination:  General exam: Mildly uncomfortable appearing Respiratory system: Moderately increased work of breathing, diffuse rhonchi, intermittent expiratory wheezing worse on the left, no crackles Cardiovascular system: Regular rate and rhythm, no murmurs Gastrointestinal system: Soft, nondistended, no rebound or guarding, plus bowel sounds Central nervous system: Alert and oriented.  Grossly intact, moving all extremities Extremities: 1+ plus lower extremity edema. Skin: No rashes over visible skin Psychiatry: Judgement and insight appear normal. Mood & affect appropriate.     Data Reviewed: I have personally reviewed following labs and imaging studies  CBC: Recent Labs  Lab  10/24/18 1912 10/26/18 0636  WBC 10.9* 13.0*  NEUTROABS 7.9*  --   HGB 11.1* 10.7*  HCT 35.6* 34.5*  MCV 87.7 85.8  PLT 276 536   Basic Metabolic Panel: Recent Labs  Lab 10/24/18 1912 10/26/18 0636  NA 138 140  K 3.7 4.6  CL 104 107  CO2 24 24  GLUCOSE 124* 165*  BUN 27* 42*  CREATININE 2.32* 2.49*  CALCIUM  8.8* 9.0  MG  --  1.8  PHOS  --  3.9   GFR: Estimated Creatinine Clearance: 20.3 mL/min (A) (by C-G formula based on SCr of 2.49 mg/dL (H)). Liver Function Tests: Recent Labs  Lab 10/26/18 0636  ALBUMIN 3.2*   No results for input(s): LIPASE, AMYLASE in the last 168 hours. No results for input(s): AMMONIA in the last 168 hours. Coagulation Profile: No results for input(s): INR, PROTIME in the last 168 hours. Cardiac Enzymes: No results for input(s): CKTOTAL, CKMB, CKMBINDEX, TROPONINI in the last 168 hours. BNP (last 3 results) Recent Labs    04/10/18 1715  PROBNP 37.0   HbA1C: Recent Labs    10/25/18 0021  HGBA1C 5.4   CBG: Recent Labs  Lab 10/25/18 0735 10/25/18 1152 10/25/18 1632 10/25/18 2214 10/26/18 0757  GLUCAP 139* 165* 188* 164* 146*   Lipid Profile: No results for input(s): CHOL, HDL, LDLCALC, TRIG, CHOLHDL, LDLDIRECT in the last 72 hours. Thyroid Function Tests: No results for input(s): TSH, T4TOTAL, FREET4, T3FREE, THYROIDAB in the last 72 hours. Anemia Panel: No results for input(s): VITAMINB12, FOLATE, FERRITIN, TIBC, IRON, RETICCTPCT in the last 72 hours. Sepsis Labs: No results for input(s): PROCALCITON, LATICACIDVEN in the last 168 hours.  No results found for this or any previous visit (from the past 240 hour(s)).       Radiology Studies: Dg Chest 2 View  Result Date: 10/24/2018 CLINICAL DATA:  Initial evaluation for acute shortness of breath, cough. EXAM: CHEST - 2 VIEW COMPARISON:  Prior radiograph from 04/10/2018 FINDINGS: Transverse heart size within normal limits. Mediastinal silhouette normal. Lungs normally inflated. Changes related underlying COPD. Patchy and hazy densities within the right mid and lower lung, suspicious for possible infiltrate given provided history. No edema or effusion. No pneumothorax. No acute osseous finding. IMPRESSION: 1. Patchy densities within the right mid and lower lung, suspicious for possible infectious  infiltrates given provided history. 2. Underlying COPD. Electronically Signed   By: Jeannine Boga M.D.   On: 10/24/2018 19:50        Scheduled Meds: . amLODipine  10 mg Oral Daily  . azithromycin  500 mg Oral Q24H  . enoxaparin (LOVENOX) injection  30 mg Subcutaneous QHS  . furosemide  20 mg Oral Daily  . guaiFENesin  400 mg Oral BID  . hydrALAZINE  25 mg Oral Q8H  . insulin aspart  0-5 Units Subcutaneous QHS  . insulin aspart  0-9 Units Subcutaneous TID WC  . ipratropium-albuterol  3 mL Nebulization TID  . labetalol  100 mg Oral BID  . methylPREDNISolone (SOLU-MEDROL) injection  40 mg Intravenous Q12H  . pantoprazole  40 mg Oral Daily   Continuous Infusions: . cefTRIAXone (ROCEPHIN)  IV 1 g (10/26/18 0805)     LOS: 0 days    Time spent: [place], MD [facility]  If 7PM-7AM, please contact night-coverage [email or web address] Password [facility] 10/26/2018, 10:54 AM

## 2018-10-27 LAB — CBC
HCT: 33.7 % — ABNORMAL LOW (ref 36.0–46.0)
Hemoglobin: 10.4 g/dL — ABNORMAL LOW (ref 12.0–15.0)
MCH: 27.4 pg (ref 26.0–34.0)
MCHC: 30.9 g/dL (ref 30.0–36.0)
MCV: 88.9 fL (ref 80.0–100.0)
Platelets: 292 10*3/uL (ref 150–400)
RBC: 3.79 MIL/uL — ABNORMAL LOW (ref 3.87–5.11)
RDW: 13.2 % (ref 11.5–15.5)
WBC: 13.2 10*3/uL — ABNORMAL HIGH (ref 4.0–10.5)
nRBC: 0 % (ref 0.0–0.2)

## 2018-10-27 LAB — RENAL FUNCTION PANEL
Albumin: 3.3 g/dL — ABNORMAL LOW (ref 3.5–5.0)
Anion gap: 8 (ref 5–15)
BUN: 52 mg/dL — ABNORMAL HIGH (ref 8–23)
CO2: 24 mmol/L (ref 22–32)
Calcium: 8.7 mg/dL — ABNORMAL LOW (ref 8.9–10.3)
Chloride: 108 mmol/L (ref 98–111)
Creatinine, Ser: 2.25 mg/dL — ABNORMAL HIGH (ref 0.44–1.00)
GFR calc Af Amer: 25 mL/min — ABNORMAL LOW (ref >60)
GFR calc non Af Amer: 21 mL/min — ABNORMAL LOW (ref >60)
Glucose, Bld: 185 mg/dL — ABNORMAL HIGH (ref 70–99)
Phosphorus: 3.4 mg/dL (ref 2.5–4.6)
Potassium: 4.4 mmol/L (ref 3.5–5.1)
Sodium: 140 mmol/L (ref 135–145)

## 2018-10-27 LAB — GLUCOSE, CAPILLARY
GLUCOSE-CAPILLARY: 189 mg/dL — AB (ref 70–99)
Glucose-Capillary: 150 mg/dL — ABNORMAL HIGH (ref 70–99)
Glucose-Capillary: 157 mg/dL — ABNORMAL HIGH (ref 70–99)
Glucose-Capillary: 240 mg/dL — ABNORMAL HIGH (ref 70–99)

## 2018-10-27 MED ORDER — PROMETHAZINE-CODEINE 6.25-10 MG/5ML PO SYRP: 5.0000 mL | ORAL_SOLUTION | Freq: Four times a day (QID) | ORAL | Status: DC | PRN

## 2018-10-27 NOTE — Evaluation (Signed)
Physical Therapy Evaluation Patient Details Name: Victoria Bird MRN: [id number] DOB: May 12, 1946 Today's Date: 10/27/2018   History of Present Illness  73 year old with past medical history relevant for hypertension, COPD on 2 to 3 L nasal cannula, stage III CKD, type 2 diabetes coming in with shortness of breath and found to have COPD exacerbation secondary to community-acquired pneumonia  Clinical Impression  Pt admitted with above diagnosis. Pt currently with functional limitations due to the deficits listed below (see PT Problem List). Min/guard assist for stand pivot transfer from bed to recliner, pt unable to tolerate ambulation 2* dyspnea/fatigue with transfer. SaO2 92% on 2L O2 with minimal activity.  Pt will benefit from skilled PT to increase their independence and safety with mobility to allow discharge to the venue listed below.       Follow Up Recommendations Home health PT    Equipment Recommendations  None recommended by PT    Recommendations for Other Services       Precautions / Restrictions Precautions Precautions: Fall Precaution Comments: pt reports a fall 3 weeks ago Restrictions Weight Bearing Restrictions: No      Mobility  Bed Mobility Overal bed mobility: Modified Independent             General bed mobility comments: HOB up, used rail  Transfers Overall transfer level: Needs assistance Equipment used: (bedrail & armrest) Transfers: Stand Pivot Transfers   Stand pivot transfers: Min guard       General transfer comment: used bedrail and armrest of recliner for single UE support, SaO2 92% on 2L O2 with transfer, 2/4 dyspnea  Ambulation/Gait             General Gait Details: unable 2* fatigue/dyspnea with SPT  Stairs            Wheelchair Mobility    Modified Rankin (Stroke Patients Only)       Balance Overall balance assessment: Needs assistance   Sitting balance-Leahy Scale: Good     Standing balance support:  Single extremity supported Standing balance-Leahy Scale: Fair                               Pertinent Vitals/Pain Pain Assessment: No/denies pain    Home Living Family/patient expects to be discharged to:: Private residence Living Arrangements: Other relatives(grandson, foster daughter) Available Help at Discharge: Available 24 hours/day;Family Type of Home: Mobile home Home Access: Stairs to enter Entrance Stairs-Rails: Right Entrance Stairs-Number of Steps: 6 Home Layout: One level Home Equipment: Cane - single point;Walker - 4 wheels Additional Comments: uses cane mostly because walker is too heavy for her to carry up and down entry steps    Prior Function Level of Independence: Independent with assistive device(s)         Comments: uses cane in home. sometimes uses walker.  drives     Hand Dominance        Extremity/Trunk Assessment   Upper Extremity Assessment Upper Extremity Assessment: Overall WFL for tasks assessed    Lower Extremity Assessment Lower Extremity Assessment: Overall WFL for tasks assessed(B knee ext 4/5)    Cervical / Trunk Assessment Cervical / Trunk Assessment: Normal  Communication   Communication: No difficulties  Cognition Arousal/Alertness: Awake/alert Behavior During Therapy: WFL for tasks assessed/performed Overall Cognitive Status: Within Functional Limits for tasks assessed  General Comments      Exercises     Assessment/Plan    PT Assessment Patient needs continued PT services  PT Problem List Decreased activity tolerance;Cardiopulmonary status limiting activity;Decreased balance;Decreased mobility       PT Treatment Interventions Functional mobility training;Therapeutic exercise;Therapeutic activities;Patient/family education    PT Goals (Current goals can be found in the Care Plan section)  Acute Rehab PT Goals Patient Stated Goal: improve  breathing PT Goal Formulation: With patient Time For Goal Achievement: 11/10/18 Potential to Achieve Goals: Good    Frequency Min 3X/week   Barriers to discharge        Co-evaluation               AM-PAC PT "6 Clicks" Mobility  Outcome Measure Help needed turning from your back to your side while in a flat bed without using bedrails?: A Little Help needed moving from lying on your back to sitting on the side of a flat bed without using bedrails?: A Little Help needed moving to and from a bed to a chair (including a wheelchair)?: A Little Help needed standing up from a chair using your arms (e.g., wheelchair or bedside chair)?: A Little Help needed to walk in hospital room?: A Lot Help needed climbing 3-5 steps with a railing? : A Lot 6 Click Score: 16    End of Session Equipment Utilized During Treatment: Gait belt Activity Tolerance: Patient limited by fatigue Patient left: in chair;with call bell/phone within reach Nurse Communication: Mobility status PT Visit Diagnosis: Difficulty in walking, not elsewhere classified (R26.2)    Time: 8144-8185 PT Time Calculation (min) (ACUTE ONLY): 15 min   Charges:   PT Evaluation $PT Eval Low Complexity: 1 Low         [name] PT 10/27/2018  Acute Rehabilitation Services Pager [phone] Office [phone]

## 2018-10-27 NOTE — Plan of Care (Signed)
  Problem: Nutrition: Goal: Adequate nutrition will be maintained Outcome: Progressing   Problem: Elimination: Goal: Will not experience complications related to bowel motility Outcome: Progressing   Problem: Activity: Goal: Risk for activity intolerance will decrease Outcome: Progressing   

## 2018-10-27 NOTE — Progress Notes (Signed)
PROGRESS NOTE    Victoria Bird  OAC:[id number] DOB: [birth year] DOA: 10/24/2018 PCP: Hoyt Koch, MD   Brief Narrative:  73 year old with past medical history relevant for hypertension, COPD on 2 to 3 L nasal cannula, stage III CKD, type 2 diabetes coming in with shortness of breath and found to have COPD exacerbation secondary to community-acquired pneumonia.   Assessment & Plan:   Principal Problem:   PNA (pneumonia) Active Problems:   Hypertension   OSA (obstructive sleep apnea)   Diabetes mellitus type 2, controlled ([name])   COPD exacerbation ([name])   Chronic respiratory failure with hypoxia ([name])   #) COPD exacerbation due to community-acquired pneumonia: Somewhat improved with patient is still quite tachypneic and short of breath.  Wheezing is improved somewhat but she is still Able to get out of bed without being short of breath. - Continue short acting bronchodilators every 6 hours and as needed -Continue L AMA - Continue IV ceftriaxone and azithromycin started 10/25/2018 -Influenza panel negative -Blood cultures 10/24/2018 no growth to date -IV methylprednisolone 40 mg every 12 -Codeine with promethazine for cough  #) Obstructive sleep apnea: -Continue CPAP  #) Stage III CKD: This is stabilized. -Hold nephrotoxins  #) Type 2 diabetes: Patient will likely not be able to be discharged on metformin due to her degree of CKD.  Regardless her hemoglobin A1c is 5.4 and it is not clear that she needs active treatment for her diabetes at this time. -Sliding scale insulin, AC at bedtime -Discontinue metformin  #) Hypertension: -Continue amlodipine 10 mg daily - Continue labetalol 200 mg twice daily -Continue hydralazine 25 mg 3 times daily  #) Lower extremity edema: -Continue furosemide 20 mg daily  Fluids: Tolerating p.o. Electrolytes: Monitor and supplement Nutrition: Heart healthy diet/carb restricted  Prophylaxis: Enoxaparin   Disposition: Pending  improved respiratory status and PT evaluation  Full code    Consultants:   None  Procedures:   None  Antimicrobials:   IV ceftriaxone started 10/25/2018  P.o. azithromycin started 10/25/2018   Subjective: Patient reports that she is feeling somewhat better but continues to have profound cough.  She is less short of breath and wheezing but she continues to have 4-5 word dyspnea.  She denies any chest pain, nausea, vomiting, diarrhea  Objective: Vitals:   10/27/18 0540 10/27/18 0540 10/27/18 0807 10/27/18 0932  BP: 139/74 139/74  139/71  Pulse:  71 98 88  Resp:  16 18   Temp:  97.9 F (36.6 C)    TempSrc:  Oral    SpO2:  95% 94% 95%  Weight:      Height:        Intake/Output Summary (Last 24 hours) at 10/27/2018 1030 Last data filed at 10/27/2018 0900 Gross per 24 hour  Intake 640 ml  Output 1000 ml  Net -360 ml   Filed Weights   10/24/18 2233 10/26/18 0525  Weight: 74.8 kg 76.7 kg    Examination:  General exam: No acute distress Respiratory system: Mildly increased work of breathing, scattered rhonchi, mild expiratory wheezing worse on the left, no crackles Cardiovascular system: Regular rate and rhythm, no murmurs Gastrointestinal system: Soft, nondistended, no rebound or guarding, plus bowel sounds Central nervous system: Alert and oriented.  Grossly intact, moving all extremities Extremities: 1+ plus lower extremity edema. Skin: No rashes over visible skin Psychiatry: Judgement and insight appear normal. Mood & affect appropriate.     Data Reviewed: I have personally reviewed following labs and imaging studies  CBC: Recent Labs  Lab 10/24/18 1912 10/26/18 0636 10/27/18 0547  WBC 10.9* 13.0* 13.2*  NEUTROABS 7.9*  --   --   HGB 11.1* 10.7* 10.4*  HCT 35.6* 34.5* 33.7*  MCV 87.7 85.8 88.9  PLT 276 304 992   Basic Metabolic Panel: Recent Labs  Lab 10/24/18 1912 10/26/18 0636 10/27/18 0547  NA 138 140 140  K 3.7 4.6 4.4  CL 104 107 108    CO2 24 24 24   GLUCOSE 124* 165* 185*  BUN 27* 42* 52*  CREATININE 2.32* 2.49* 2.25*  CALCIUM 8.8* 9.0 8.7*  MG  --  1.8  --   PHOS  --  3.9 3.4   GFR: Estimated Creatinine Clearance: 22.5 mL/min (A) (by C-G formula based on SCr of 2.25 mg/dL (H)). Liver Function Tests: Recent Labs  Lab 10/26/18 0636 10/27/18 0547  ALBUMIN 3.2* 3.3*   No results for input(s): LIPASE, AMYLASE in the last 168 hours. No results for input(s): AMMONIA in the last 168 hours. Coagulation Profile: No results for input(s): INR, PROTIME in the last 168 hours. Cardiac Enzymes: No results for input(s): CKTOTAL, CKMB, CKMBINDEX, TROPONINI in the last 168 hours. BNP (last 3 results) Recent Labs    04/10/18 1715  PROBNP 37.0   HbA1C: Recent Labs    10/25/18 0021  HGBA1C 5.4   CBG: Recent Labs  Lab 10/26/18 0757 10/26/18 1141 10/26/18 1642 10/26/18 2059 10/27/18 0746  GLUCAP 146* 209* 155* 185* 150*   Lipid Profile: No results for input(s): CHOL, HDL, LDLCALC, TRIG, CHOLHDL, LDLDIRECT in the last 72 hours. Thyroid Function Tests: No results for input(s): TSH, T4TOTAL, FREET4, T3FREE, THYROIDAB in the last 72 hours. Anemia Panel: No results for input(s): VITAMINB12, FOLATE, FERRITIN, TIBC, IRON, RETICCTPCT in the last 72 hours. Sepsis Labs: No results for input(s): PROCALCITON, LATICACIDVEN in the last 168 hours.  No results found for this or any previous visit (from the past 240 hour(s)).       Radiology Studies: Dg Chest Port 1 View  Result Date: 10/26/2018 CLINICAL DATA:  Shortness of breath, hypoxia. EXAM: PORTABLE CHEST 1 VIEW COMPARISON:  Chest x-rays dated 10/24/2018 and 03/18/2017. FINDINGS: Heart size and mediastinal contours are stable. Lungs now appear clear. There is no confluent opacity appreciated on today's exam to suggest pneumonia. No pleural effusion or pneumothorax seen. No acute or suspicious osseous finding. IMPRESSION: No active disease. No evidence of pneumonia or  pulmonary edema on today's exam. Electronically Signed   By: Franki Cabot M.D.   On: 10/26/2018 11:25        Scheduled Meds: . amLODipine  10 mg Oral Daily  . azithromycin  500 mg Oral Q24H  . enoxaparin (LOVENOX) injection  30 mg Subcutaneous QHS  . furosemide  20 mg Oral Daily  . guaiFENesin  400 mg Oral BID  . hydrALAZINE  25 mg Oral Q8H  . insulin aspart  0-5 Units Subcutaneous QHS  . insulin aspart  0-9 Units Subcutaneous TID WC  . ipratropium-albuterol  3 mL Nebulization TID  . labetalol  100 mg Oral BID  . methylPREDNISolone (SOLU-MEDROL) injection  40 mg Intravenous Q12H  . pantoprazole  40 mg Oral Daily   Continuous Infusions: . cefTRIAXone (ROCEPHIN)  IV 1 g (10/27/18 0936)     LOS: 1 day    Time spent: [place], MD [facility]  If 7PM-7AM, please contact night-coverage [email or web address] Password [email or web address] 10/27/2018, 10:30 AM

## 2018-10-28 ENCOUNTER — Telehealth: Payer: Self-pay | Admitting: *Deleted

## 2018-10-28 LAB — GLUCOSE, CAPILLARY
Glucose-Capillary: 171 mg/dL — ABNORMAL HIGH (ref 70–99)
Glucose-Capillary: 158 mg/dL — ABNORMAL HIGH (ref 70–99)

## 2018-10-28 LAB — BASIC METABOLIC PANEL
Anion gap: 8 (ref 5–15)
BUN: 55 mg/dL — ABNORMAL HIGH (ref 8–23)
CO2: 22 mmol/L (ref 22–32)
Calcium: 8.5 mg/dL — ABNORMAL LOW (ref 8.9–10.3)
Creatinine, Ser: 1.94 mg/dL — ABNORMAL HIGH (ref 0.44–1.00)
GFR calc Af Amer: 29 mL/min — ABNORMAL LOW (ref >60)
GFR calc non Af Amer: 25 mL/min — ABNORMAL LOW (ref >60)
Potassium: 4.4 mmol/L (ref 3.5–5.1)
Sodium: 137 mmol/L (ref 135–145)

## 2018-10-28 LAB — BASIC METABOLIC PANEL WITH GFR
Chloride: 107 mmol/L (ref 98–111)
Glucose, Bld: 187 mg/dL — ABNORMAL HIGH (ref 70–99)

## 2018-10-28 MED ORDER — PROMETHAZINE-CODEINE 6.25-10 MG/5ML PO SYRP: 5.0000 mL | ORAL_SOLUTION | Freq: Four times a day (QID) | ORAL | 0 refills | Status: DC | PRN

## 2018-10-28 MED ORDER — BENZONATATE 100 MG PO CAPS: 100.0000 mg | ORAL_CAPSULE | Freq: Four times a day (QID) | ORAL | 0 refills | Status: AC | PRN

## 2018-10-28 MED ORDER — PREDNISONE 20 MG PO TABS: 40.0000 mg | ORAL_TABLET | Freq: Every day | ORAL | 0 refills | Status: DC

## 2018-10-28 MED ORDER — DEXAMETHASONE 1 MG PO TABS: 1.0000 mg | ORAL_TABLET | Freq: Two times a day (BID) | ORAL | 0 refills | Status: AC

## 2018-10-28 MED ORDER — CEFDINIR 300 MG PO CAPS: 300.0000 mg | ORAL_CAPSULE | Freq: Two times a day (BID) | ORAL | 0 refills | Status: AC

## 2018-10-28 NOTE — Telephone Encounter (Signed)
Called pt to make hosp follow-up appt w/PCP. Pt states she did not want to make at this time due to not having no transportation. She states she will check with her daughter and if she will bring her she will call back and schedule appt.Marland KitchenJohny Chess

## 2018-10-28 NOTE — Care Management Note (Addendum)
Case Management Note  Patient Details  Name: Victoria Bird MRN: [id number] Date of Birth: 27-Jun-1946  Subjective/Objective:                  DISCHARGED  Action/Plan: Refuses home p.t. they did it last time and I did not need it then, States that house is already set up for ambulation and safety. Daughter is to bring travel tank to travel home with.  o2 is through [facility].  Expected Discharge Date:  10/28/18               Expected Discharge Plan:     In-House Referral:     Discharge planning Services     Post Acute Care Choice:    Choice offered to:     DME Arranged:    DME Agency:     HH Arranged:    HH Agency:     Status of Service:     If discussed at [employer], dates discussed:    Additional Comments:  Leeroy Cha, RN 10/28/2018, 10:27 AM

## 2018-10-28 NOTE — Discharge Instructions (Signed)
COPD and Physical Activity  Chronic obstructive pulmonary disease (COPD) is a long-term (chronic) condition that affects the lungs. COPD is a general term that can be used to describe many different lung problems that cause lung swelling (inflammation) and limit airflow, including chronic bronchitis and emphysema.  The main symptom of COPD is shortness of breath, which makes it harder to do even simple tasks. This can also make it harder to exercise and be active. Talk with your health care provider about treatments to help you breathe better and actions you can take to prevent breathing problems during physical activity.  What are the benefits of exercising with COPD?  Exercising regularly is an important part of a healthy lifestyle. You can still exercise and do physical activities even though you have COPD. Exercise and physical activity improve your shortness of breath by increasing blood flow (circulation). This causes your heart to pump more oxygen through your body. Moderate exercise can improve your:  · Oxygen use.  · Energy level.  · Shortness of breath.  · Strength in your breathing muscles.  · Heart health.  · Sleep.  · Self-esteem and feelings of self-worth.  · Depression, stress, and anxiety levels.  Exercise can benefit everyone with COPD. The severity of your disease may affect how hard you can exercise, especially at first, but everyone can benefit. Talk with your health care provider about how much exercise is safe for you, and which activities and exercises are safe for you.  What actions can I take to prevent breathing problems during physical activity?  · Sign up for a pulmonary rehabilitation program. This type of program may include:  ? Education about lung diseases.  ? Exercise classes that teach you how to exercise and be more active while improving your breathing. This usually involves:  § Exercise using your lower extremities, such as a stationary bicycle.  § About 30 minutes of exercise, 2  to 5 times per week, for 6 to 12 weeks  § Strength training, such as push ups or leg lifts.  ? Nutrition education.  ? Group classes in which you can talk with others who also have COPD and learn ways to manage stress.  · If you use an oxygen tank, you should use it while you exercise. Work with your health care provider to adjust your oxygen for your physical activity. Your resting flow rate is different from your flow rate during physical activity.  · While you are exercising:  ? Take slow breaths.  ? Pace yourself and do not try to go too fast.  ? Purse your lips while breathing out. Pursing your lips is similar to a kissing or whistling position.  ? If doing exercise that uses a quick burst of effort, such as weight lifting:  § Breathe in before starting the exercise.  § Breathe out during the hardest part of the exercise (such as raising the weights).  Where to find support  You can find support for exercising with COPD from:  · Your health care provider.  · A pulmonary rehabilitation program.  · Your local health department or community health programs.  · Support groups, online or in-person. Your health care provider may be able to recommend support groups.  Where to find more information  You can find more information about exercising with COPD from:  · American Lung Association: lung.org.  · COPD Foundation: copdfoundation.org.  Contact a health care provider if:  · Your symptoms get worse.  · You   have chest pain.  · You have nausea.  · You have a fever.  · You have trouble talking or catching your breath.  · You want to start a new exercise program or a new activity.  Summary  · COPD is a general term that can be used to describe many different lung problems that cause lung swelling (inflammation) and limit airflow. This includes chronic bronchitis and emphysema.  · Exercise and physical activity improve your shortness of breath by increasing blood flow (circulation). This causes your heart to provide more  oxygen to your body.  · Contact your health care provider before starting any exercise program or new activity. Ask your health care provider what exercises and activities are safe for you.  This information is not intended to replace advice given to you by your health care provider. Make sure you discuss any questions you have with your health care provider.  Document Released: 10/11/2017 Document Revised: 10/11/2017 Document Reviewed: 10/11/2017  Elsevier Interactive Patient Education © 2019 [employer]

## 2018-10-28 NOTE — Discharge Summary (Addendum)
Physician Discharge Summary  Victoria Bird CWC:[id number] DOB: 08/07/1946 DOA: 10/24/2018  PCP: Hoyt Koch, MD  Admit date: 10/24/2018 Discharge date: 10/28/2018  Admitted From: Home Disposition: Home  Recommendations for Outpatient Follow-up:  1. Follow up with PCP in 1-2 weeks 2. Please obtain BMP/CBC in one week   Home Health: Yes, home health PT Equipment/Devices: 3 L nasal cannula  Discharge Condition: Stable CODE STATUS: Full Diet recommendation: Heart Healthy  Brief/Interim Summary:  #) COPD exacerbation due to community-acquired pneumonia: Patient was admitted with wheezing, shortness of breath, subjective fevers and found to have community-acquired pneumonia and COPD exacerbation.  Patient was given scheduled bronchodilators, home long-acting muscarinic antagonist, IV antibiotics and IV steroids with improvement in respiratory status.  Patient was evaluated by physical therapy and home health PT was recommended.  Patient was discharged home on oral cefdinir and steroids to complete her course of approximately 7 days.  #) Obstructive sleep apnea: Patient was continued on home CPAP.  #) Stage III CKD: This was stable.  #) Type 2 diabetes: Hemoglobin A1c was 5.4.  Patient's home metformin was discontinued as her A1c is quite low and she has fairly significant CKD.  #): Patient was continued on home amlodipine, labetalol, hydralazine.  #) Lower extremity edema: Patient was continued on home furosemide.  Discharge Diagnoses:  Principal Problem:   PNA (pneumonia) Active Problems:   Hypertension   OSA (obstructive sleep apnea)   Diabetes mellitus type 2, controlled ([name])   COPD exacerbation ([name])   Chronic respiratory failure with hypoxia [facility])    Discharge Instructions  Discharge Instructions    Diet - low sodium heart healthy   Complete by:  As directed    Increase activity slowly   Complete by:  As directed      Allergies as of 10/28/2018       Reactions   Aspartame And Phenylalanine Nausea And Vomiting   Patient says she is allergic to all artificial sweeteners   Doxycycline Other (See Comments)   Nausea, vomiting, HA, double vision   Tramadol Shortness Of Breath, Nausea Only   Codeine Itching   Has not tried benadryl to alleviate side effects   Neurontin [gabapentin] Other (See Comments)   Dizzy, "drugged" feeling, sleepy   Sulfa Antibiotics Other (See Comments)   Kidney problem    Symbicort [budesonide-formoterol Fumarate]    Swelling of face and inside of mouth per pt   Penicillins Rash   Has patient had a PCN reaction causing immediate rash, facial/tongue/throat swelling, SOB or lightheadedness with hypotension: No Has patient had a PCN reaction causing severe rash involving mucus membranes or skin necrosis: No Has patient had a PCN reaction that required hospitalization No Has patient had a PCN reaction occurring within the last 10 years: No If all of the above answers are "NO", then may proceed with Cephalosporin use.   Prednisone Rash   Patient stated she received Prednisone while she was in the hospital and experienced a rash and "extreme pain.'      Medication List    STOP taking these medications   metFORMIN 500 MG tablet Commonly known as:  GLUCOPHAGE     TAKE these medications   albuterol 0.63 MG/3ML nebulizer solution Commonly known as:  ACCUNEB Take 1 ampule by nebulization every 6 (six) hours as needed for wheezing or shortness of breath.   albuterol 108 (90 Base) MCG/ACT inhaler Commonly known as:  PROAIR HFA Inhale 2 puffs into the lungs every 6 (six) hours  as needed for wheezing or shortness of breath.   amLODipine 10 MG tablet Commonly known as:  NORVASC Take 1 tablet (10 mg total) by mouth daily.   benzonatate 100 MG capsule Commonly known as:  TESSALON PERLES Take 1 capsule (100 mg total) by mouth every 6 (six) hours as needed for cough.   blood glucose meter kit and supplies Kit Use to  test blood sugar   cefdinir 300 MG capsule Commonly known as:  OMNICEF Take 1 capsule (300 mg total) by mouth 2 (two) times daily for 3 days.   cyclobenzaprine 5 MG tablet Commonly known as:  FLEXERIL TAKE 1 TABLET(5 MG) BY MOUTH THREE TIMES DAILY AS NEEDED FOR MUSCLE SPASMS What changed:  See the new instructions.   dexamethasone 1 MG tablet Commonly known as:  DECADRON Take 1 tablet (1 mg total) by mouth 2 (two) times daily with a meal for 3 days.   furosemide 40 MG tablet Commonly known as:  LASIX Take 1 tablet (40 mg total) by mouth daily for 7 days.   furosemide 20 MG tablet Commonly known as:  LASIX Take 1 tablet (20 mg total) by mouth daily.   glucose blood test strip Commonly known as:  IGLUCOSE TEST STRIPS Use as instructed   guaifenesin 400 MG Tabs tablet Commonly known as:  HUMIBID E Take 400 mg by mouth 2 (two) times daily.   hydrALAZINE 25 MG tablet Commonly known as:  APRESOLINE Take 1 tablet (25 mg total) by mouth every 8 (eight) hours.   labetalol 100 MG tablet Commonly known as:  NORMODYNE Take 1 tablet (100 mg total) by mouth 2 (two) times daily.   Lancets Misc Use to test blood sugar one daily   pantoprazole 40 MG tablet Commonly known as:  PROTONIX TAKE 1 TABLET(40 MG) BY MOUTH DAILY What changed:  See the new instructions.   tiotropium 18 MCG inhalation capsule Commonly known as:  SPIRIVA HANDIHALER Place 1 capsule (18 mcg total) into inhaler and inhale daily.       Allergies  Allergen Reactions  . Aspartame And Phenylalanine Nausea And Vomiting    Patient says she is allergic to all artificial sweeteners  . Doxycycline Other (See Comments)    Nausea, vomiting, HA, double vision  . Tramadol Shortness Of Breath and Nausea Only  . Codeine Itching    Has not tried benadryl to alleviate side effects  . Neurontin [Gabapentin] Other (See Comments)    Dizzy, "drugged" feeling, sleepy  . Sulfa Antibiotics Other (See Comments)    Kidney  problem   . Symbicort [Budesonide-Formoterol Fumarate]     Swelling of face and inside of mouth per pt  . Penicillins Rash    Has patient had a PCN reaction causing immediate rash, facial/tongue/throat swelling, SOB or lightheadedness with hypotension: No Has patient had a PCN reaction causing severe rash involving mucus membranes or skin necrosis: No Has patient had a PCN reaction that required hospitalization No Has patient had a PCN reaction occurring within the last 10 years: No If all of the above answers are "NO", then may proceed with Cephalosporin use.  . Prednisone Rash    Patient stated she received Prednisone while she was in the hospital and experienced a rash and "extreme pain.'    Consultations:  None   Procedures/Studies: Dg Chest 2 View  Result Date: 10/24/2018 CLINICAL DATA:  Initial evaluation for acute shortness of breath, cough. EXAM: CHEST - 2 VIEW COMPARISON:  Prior radiograph from 04/10/2018  FINDINGS: Transverse heart size within normal limits. Mediastinal silhouette normal. Lungs normally inflated. Changes related underlying COPD. Patchy and hazy densities within the right mid and lower lung, suspicious for possible infiltrate given provided history. No edema or effusion. No pneumothorax. No acute osseous finding. IMPRESSION: 1. Patchy densities within the right mid and lower lung, suspicious for possible infectious infiltrates given provided history. 2. Underlying COPD. Electronically Signed   By: Jeannine Boga M.D.   On: 10/24/2018 19:50   Dg Chest Port 1 View  Result Date: 10/26/2018 CLINICAL DATA:  Shortness of breath, hypoxia. EXAM: PORTABLE CHEST 1 VIEW COMPARISON:  Chest x-rays dated 10/24/2018 and 03/18/2017. FINDINGS: Heart size and mediastinal contours are stable. Lungs now appear clear. There is no confluent opacity appreciated on today's exam to suggest pneumonia. No pleural effusion or pneumothorax seen. No acute or suspicious osseous finding.  IMPRESSION: No active disease. No evidence of pneumonia or pulmonary edema on today's exam. Electronically Signed   By: Franki Cabot M.D.   On: 10/26/2018 11:25      Subjective:   Discharge Exam: Vitals:   10/28/18 0531 10/28/18 0909  BP: 139/75   Pulse: 72   Resp: 19   Temp: (!) 97.5 F (36.4 C)   SpO2: 100% 96%   Vitals:   10/27/18 2022 10/27/18 2119 10/28/18 0531 10/28/18 0909  BP: (!) 156/71  139/75   Pulse: 82  72   Resp: 19  19   Temp: 98.2 F (36.8 C)  (!) 97.5 F (36.4 C)   TempSrc: Oral  Oral   SpO2: 96% 95% 100% 96%  Weight:      Height:        General exam: No acute distress Respiratory system:  No increased work of breathing, scattered rhonchi, no wheezing, no crackles Cardiovascular system: Regular rate and rhythm, no murmurs Gastrointestinal system: Soft, nondistended, no rebound or guarding, plus bowel sounds Central nervous system: Alert and oriented.  Grossly intact, moving all extremities Extremities: 1+ plus lower extremity edema. Skin: No rashes over visible skin Psychiatry: Judgement and insight appear normal. Mood & affect appropriate.        The results of significant diagnostics from this hospitalization (including imaging, microbiology, ancillary and laboratory) are listed below for reference.     Microbiology: No results found for this or any previous visit (from the past 240 hour(s)).   Labs: BNP (last 3 results) Recent Labs    10/24/18 1912  BNP 63.7   Basic Metabolic Panel: Recent Labs  Lab 10/24/18 1912 10/26/18 0636 10/27/18 0547 10/28/18 0550  NA 138 140 140 137  K 3.7 4.6 4.4 4.4  CL 104 107 108 107  CO2 '24 24 24 22  ' GLUCOSE 124* 165* 185* 187*  BUN 27* 42* 52* 55*  CREATININE 2.32* 2.49* 2.25* 1.94*  CALCIUM 8.8* 9.0 8.7* 8.5*  MG  --  1.8  --   --   PHOS  --  3.9 3.4  --    Liver Function Tests: Recent Labs  Lab 10/26/18 0636 10/27/18 0547  ALBUMIN 3.2* 3.3*   No results for input(s): LIPASE,  AMYLASE in the last 168 hours. No results for input(s): AMMONIA in the last 168 hours. CBC: Recent Labs  Lab 10/24/18 1912 10/26/18 0636 10/27/18 0547  WBC 10.9* 13.0* 13.2*  NEUTROABS 7.9*  --   --   HGB 11.1* 10.7* 10.4*  HCT 35.6* 34.5* 33.7*  MCV 87.7 85.8 88.9  PLT 276 304 292   Cardiac  Enzymes: No results for input(s): CKTOTAL, CKMB, CKMBINDEX, TROPONINI in the last 168 hours. BNP: Invalid input(s): POCBNP CBG: Recent Labs  Lab 10/27/18 1145 10/27/18 1633 10/27/18 2025 10/28/18 0727 10/28/18 1133  GLUCAP 157* 189* 240* 171* 158*   D-Dimer No results for input(s): DDIMER in the last 72 hours. Hgb A1c No results for input(s): HGBA1C in the last 72 hours. Lipid Profile No results for input(s): CHOL, HDL, LDLCALC, TRIG, CHOLHDL, LDLDIRECT in the last 72 hours. Thyroid function studies No results for input(s): TSH, T4TOTAL, T3FREE, THYROIDAB in the last 72 hours.  Invalid input(s): FREET3 Anemia work up No results for input(s): VITAMINB12, FOLATE, FERRITIN, TIBC, IRON, RETICCTPCT in the last 72 hours. Urinalysis    Component Value Date/Time   COLORURINE YELLOW 03/02/2017 2245   APPEARANCEUR HAZY (A) 03/02/2017 2245   LABSPEC 1.008 03/02/2017 2245   PHURINE 5.0 03/02/2017 2245   GLUCOSEU NEGATIVE 03/02/2017 2245   HGBUR NEGATIVE 03/02/2017 2245   BILIRUBINUR NEGATIVE 03/02/2017 2245   KETONESUR NEGATIVE 03/02/2017 2245   PROTEINUR NEGATIVE 03/02/2017 2245   UROBILINOGEN 1.0 06/16/2015 1545   NITRITE NEGATIVE 03/02/2017 2245   LEUKOCYTESUR NEGATIVE 03/02/2017 2245   Sepsis Labs Invalid input(s): PROCALCITONIN,  WBC,  LACTICIDVEN Microbiology No results found for this or any previous visit (from the past 240 hour(s)).   Time coordinating discharge: 35  SIGNED:   Cristy Folks, MD  [facility] 10/28/2018, 2:02 PM  If 7PM-7AM, please contact night-coverage [email or web address] Password [email or web address]

## 2018-11-06 ENCOUNTER — Emergency Department (HOSPITAL_COMMUNITY): Payer: Medicare Other

## 2018-11-06 ENCOUNTER — Inpatient Hospital Stay (HOSPITAL_COMMUNITY)
Admission: EM | Admit: 2018-11-06 | Discharge: 2018-11-30 | DRG: 190 | Disposition: A | Discharge status: Skilled Nursing Facility | Payer: Medicare Other | Attending: Internal Medicine | Admitting: Internal Medicine

## 2018-11-06 ENCOUNTER — Encounter (HOSPITAL_COMMUNITY): Payer: Self-pay

## 2018-11-06 DIAGNOSIS — Z880 Allergy status to penicillin: Secondary | ICD-10-CM

## 2018-11-06 DIAGNOSIS — K567 Ileus, unspecified: Secondary | ICD-10-CM | POA: Diagnosis not present

## 2018-11-06 DIAGNOSIS — J9621 Acute and chronic respiratory failure with hypoxia: Secondary | ICD-10-CM | POA: Diagnosis not present

## 2018-11-06 DIAGNOSIS — R42 Dizziness and giddiness: Secondary | ICD-10-CM | POA: Diagnosis not present

## 2018-11-06 DIAGNOSIS — B9781 Human metapneumovirus as the cause of diseases classified elsewhere: Secondary | ICD-10-CM | POA: Diagnosis present

## 2018-11-06 DIAGNOSIS — R5381 Other malaise: Secondary | ICD-10-CM | POA: Diagnosis present

## 2018-11-06 DIAGNOSIS — D631 Anemia in chronic kidney disease: Secondary | ICD-10-CM | POA: Diagnosis not present

## 2018-11-06 DIAGNOSIS — X58XXXA Exposure to other specified factors, initial encounter: Secondary | ICD-10-CM | POA: Diagnosis present

## 2018-11-06 DIAGNOSIS — T380X5A Adverse effect of glucocorticoids and synthetic analogues, initial encounter: Secondary | ICD-10-CM | POA: Diagnosis not present

## 2018-11-06 DIAGNOSIS — Z720 Tobacco use: Secondary | ICD-10-CM | POA: Diagnosis not present

## 2018-11-06 DIAGNOSIS — K560 Paralytic ileus: Secondary | ICD-10-CM | POA: Diagnosis not present

## 2018-11-06 DIAGNOSIS — J441 Chronic obstructive pulmonary disease with (acute) exacerbation: Secondary | ICD-10-CM

## 2018-11-06 DIAGNOSIS — I10 Essential (primary) hypertension: Secondary | ICD-10-CM | POA: Diagnosis present

## 2018-11-06 DIAGNOSIS — Z6829 Body mass index (BMI) 29.0-29.9, adult: Secondary | ICD-10-CM

## 2018-11-06 DIAGNOSIS — J439 Emphysema, unspecified: Principal | ICD-10-CM | POA: Diagnosis present

## 2018-11-06 DIAGNOSIS — J189 Pneumonia, unspecified organism: Secondary | ICD-10-CM | POA: Diagnosis not present

## 2018-11-06 DIAGNOSIS — E860 Dehydration: Secondary | ICD-10-CM | POA: Diagnosis not present

## 2018-11-06 DIAGNOSIS — Z8701 Personal history of pneumonia (recurrent): Secondary | ICD-10-CM

## 2018-11-06 DIAGNOSIS — G4733 Obstructive sleep apnea (adult) (pediatric): Secondary | ICD-10-CM | POA: Diagnosis not present

## 2018-11-06 DIAGNOSIS — E7800 Pure hypercholesterolemia, unspecified: Secondary | ICD-10-CM | POA: Diagnosis present

## 2018-11-06 DIAGNOSIS — I129 Hypertensive chronic kidney disease with stage 1 through stage 4 chronic kidney disease, or unspecified chronic kidney disease: Secondary | ICD-10-CM | POA: Diagnosis present

## 2018-11-06 DIAGNOSIS — E1165 Type 2 diabetes mellitus with hyperglycemia: Secondary | ICD-10-CM | POA: Diagnosis not present

## 2018-11-06 DIAGNOSIS — Z79899 Other long term (current) drug therapy: Secondary | ICD-10-CM

## 2018-11-06 DIAGNOSIS — E870 Hyperosmolality and hypernatremia: Secondary | ICD-10-CM | POA: Diagnosis present

## 2018-11-06 DIAGNOSIS — R140 Abdominal distension (gaseous): Secondary | ICD-10-CM | POA: Diagnosis not present

## 2018-11-06 DIAGNOSIS — J9622 Acute and chronic respiratory failure with hypercapnia: Secondary | ICD-10-CM | POA: Diagnosis not present

## 2018-11-06 DIAGNOSIS — N179 Acute kidney failure, unspecified: Secondary | ICD-10-CM | POA: Diagnosis present

## 2018-11-06 DIAGNOSIS — E876 Hypokalemia: Secondary | ICD-10-CM | POA: Diagnosis not present

## 2018-11-06 DIAGNOSIS — J9601 Acute respiratory failure with hypoxia: Secondary | ICD-10-CM | POA: Diagnosis not present

## 2018-11-06 DIAGNOSIS — R1084 Generalized abdominal pain: Secondary | ICD-10-CM | POA: Diagnosis not present

## 2018-11-06 DIAGNOSIS — R000 Tachycardia, unspecified: Secondary | ICD-10-CM | POA: Diagnosis not present

## 2018-11-06 DIAGNOSIS — G8929 Other chronic pain: Secondary | ICD-10-CM | POA: Diagnosis present

## 2018-11-06 DIAGNOSIS — Z882 Allergy status to sulfonamides status: Secondary | ICD-10-CM

## 2018-11-06 DIAGNOSIS — K56609 Unspecified intestinal obstruction, unspecified as to partial versus complete obstruction: Secondary | ICD-10-CM | POA: Diagnosis not present

## 2018-11-06 DIAGNOSIS — E440 Moderate protein-calorie malnutrition: Secondary | ICD-10-CM | POA: Diagnosis not present

## 2018-11-06 DIAGNOSIS — Z4682 Encounter for fitting and adjustment of non-vascular catheter: Secondary | ICD-10-CM | POA: Diagnosis not present

## 2018-11-06 DIAGNOSIS — R339 Retention of urine, unspecified: Secondary | ICD-10-CM | POA: Diagnosis not present

## 2018-11-06 DIAGNOSIS — R531 Weakness: Secondary | ICD-10-CM | POA: Diagnosis not present

## 2018-11-06 DIAGNOSIS — F419 Anxiety disorder, unspecified: Secondary | ICD-10-CM | POA: Diagnosis present

## 2018-11-06 DIAGNOSIS — Z9981 Dependence on supplemental oxygen: Secondary | ICD-10-CM

## 2018-11-06 DIAGNOSIS — I5030 Unspecified diastolic (congestive) heart failure: Secondary | ICD-10-CM | POA: Diagnosis not present

## 2018-11-06 DIAGNOSIS — R109 Unspecified abdominal pain: Secondary | ICD-10-CM

## 2018-11-06 DIAGNOSIS — Z515 Encounter for palliative care: Secondary | ICD-10-CM | POA: Diagnosis not present

## 2018-11-06 DIAGNOSIS — M6281 Muscle weakness (generalized): Secondary | ICD-10-CM | POA: Diagnosis not present

## 2018-11-06 DIAGNOSIS — Z888 Allergy status to other drugs, medicaments and biological substances status: Secondary | ICD-10-CM

## 2018-11-06 DIAGNOSIS — Z66 Do not resuscitate: Secondary | ICD-10-CM | POA: Diagnosis present

## 2018-11-06 DIAGNOSIS — J9611 Chronic respiratory failure with hypoxia: Secondary | ICD-10-CM | POA: Diagnosis present

## 2018-11-06 DIAGNOSIS — Z7189 Other specified counseling: Secondary | ICD-10-CM | POA: Diagnosis not present

## 2018-11-06 DIAGNOSIS — E1122 Type 2 diabetes mellitus with diabetic chronic kidney disease: Secondary | ICD-10-CM | POA: Diagnosis not present

## 2018-11-06 DIAGNOSIS — Z833 Family history of diabetes mellitus: Secondary | ICD-10-CM

## 2018-11-06 DIAGNOSIS — R079 Chest pain, unspecified: Secondary | ICD-10-CM | POA: Diagnosis not present

## 2018-11-06 DIAGNOSIS — K219 Gastro-esophageal reflux disease without esophagitis: Secondary | ICD-10-CM | POA: Diagnosis not present

## 2018-11-06 DIAGNOSIS — I714 Abdominal aortic aneurysm, without rupture: Secondary | ICD-10-CM | POA: Diagnosis present

## 2018-11-06 DIAGNOSIS — R0602 Shortness of breath: Secondary | ICD-10-CM

## 2018-11-06 DIAGNOSIS — R05 Cough: Secondary | ICD-10-CM | POA: Diagnosis not present

## 2018-11-06 DIAGNOSIS — Z87891 Personal history of nicotine dependence: Secondary | ICD-10-CM

## 2018-11-06 DIAGNOSIS — N183 Chronic kidney disease, stage 3 (moderate): Secondary | ICD-10-CM | POA: Diagnosis not present

## 2018-11-06 DIAGNOSIS — Z9119 Patient's noncompliance with other medical treatment and regimen: Secondary | ICD-10-CM

## 2018-11-06 DIAGNOSIS — Z9049 Acquired absence of other specified parts of digestive tract: Secondary | ICD-10-CM

## 2018-11-06 DIAGNOSIS — M797 Fibromyalgia: Secondary | ICD-10-CM | POA: Diagnosis not present

## 2018-11-06 DIAGNOSIS — R0689 Other abnormalities of breathing: Secondary | ICD-10-CM | POA: Diagnosis not present

## 2018-11-06 DIAGNOSIS — E1121 Type 2 diabetes mellitus with diabetic nephropathy: Secondary | ICD-10-CM | POA: Diagnosis not present

## 2018-11-06 DIAGNOSIS — E1169 Type 2 diabetes mellitus with other specified complication: Secondary | ICD-10-CM | POA: Diagnosis not present

## 2018-11-06 DIAGNOSIS — Z5320 Procedure and treatment not carried out because of patient's decision for unspecified reasons: Secondary | ICD-10-CM | POA: Diagnosis present

## 2018-11-06 DIAGNOSIS — M2550 Pain in unspecified joint: Secondary | ICD-10-CM | POA: Diagnosis not present

## 2018-11-06 DIAGNOSIS — Z86010 Personal history of colonic polyps: Secondary | ICD-10-CM

## 2018-11-06 DIAGNOSIS — Z7401 Bed confinement status: Secondary | ICD-10-CM | POA: Diagnosis not present

## 2018-11-06 DIAGNOSIS — Z823 Family history of stroke: Secondary | ICD-10-CM

## 2018-11-06 DIAGNOSIS — E875 Hyperkalemia: Secondary | ICD-10-CM | POA: Diagnosis not present

## 2018-11-06 DIAGNOSIS — R110 Nausea: Secondary | ICD-10-CM | POA: Diagnosis not present

## 2018-11-06 DIAGNOSIS — K5900 Constipation, unspecified: Secondary | ICD-10-CM | POA: Diagnosis not present

## 2018-11-06 DIAGNOSIS — Z4689 Encounter for fitting and adjustment of other specified devices: Secondary | ICD-10-CM | POA: Diagnosis not present

## 2018-11-06 DIAGNOSIS — E119 Type 2 diabetes mellitus without complications: Secondary | ICD-10-CM

## 2018-11-06 DIAGNOSIS — J449 Chronic obstructive pulmonary disease, unspecified: Secondary | ICD-10-CM | POA: Diagnosis not present

## 2018-11-06 DIAGNOSIS — J9620 Acute and chronic respiratory failure, unspecified whether with hypoxia or hypercapnia: Secondary | ICD-10-CM | POA: Diagnosis not present

## 2018-11-06 DIAGNOSIS — Z811 Family history of alcohol abuse and dependence: Secondary | ICD-10-CM

## 2018-11-06 DIAGNOSIS — Z0189 Encounter for other specified special examinations: Secondary | ICD-10-CM

## 2018-11-06 DIAGNOSIS — Z9289 Personal history of other medical treatment: Secondary | ICD-10-CM

## 2018-11-06 DIAGNOSIS — Z8249 Family history of ischemic heart disease and other diseases of the circulatory system: Secondary | ICD-10-CM

## 2018-11-06 DIAGNOSIS — I2781 Cor pulmonale (chronic): Secondary | ICD-10-CM | POA: Diagnosis present

## 2018-11-06 DIAGNOSIS — R0902 Hypoxemia: Secondary | ICD-10-CM | POA: Diagnosis not present

## 2018-11-06 DIAGNOSIS — Z90710 Acquired absence of both cervix and uterus: Secondary | ICD-10-CM

## 2018-11-06 LAB — BLOOD GAS, ARTERIAL
Acid-Base Excess: 1 mmol/L (ref 0.0–2.0)
Bicarbonate: 27.4 mmol/L (ref 20.0–28.0)
Delivery systems: POSITIVE
Drawn by: 331471
Expiratory PAP: 5
FIO2: 30
Inspiratory PAP: 10
O2 Saturation: 68.4 %
PATIENT TEMPERATURE: 98.6
pCO2 arterial: 55.2 mmHg — ABNORMAL HIGH (ref 32.0–48.0)
pH, Arterial: 7.316 — ABNORMAL LOW (ref 7.350–7.450)
pO2, Arterial: 38.5 mmHg — CL (ref 83.0–108.0)

## 2018-11-06 LAB — CBC WITH DIFFERENTIAL/PLATELET
ABS IMMATURE GRANULOCYTES: 0.03 10*3/uL (ref 0.00–0.07)
Basophils Absolute: 0 10*3/uL (ref 0.0–0.1)
Basophils Relative: 0 %
Eosinophils Absolute: 0 10*3/uL (ref 0.0–0.5)
Eosinophils Relative: 0 %
HCT: 37.7 % (ref 36.0–46.0)
Hemoglobin: 11.1 g/dL — ABNORMAL LOW (ref 12.0–15.0)
Immature Granulocytes: 0 %
LYMPHS ABS: 1.2 10*3/uL (ref 0.7–4.0)
Lymphocytes Relative: 15 %
MCH: 27.1 pg (ref 26.0–34.0)
MCHC: 29.4 g/dL — ABNORMAL LOW (ref 30.0–36.0)
MCV: 92 fL (ref 80.0–100.0)
Monocytes Absolute: 0.6 10*3/uL (ref 0.1–1.0)
Monocytes Relative: 8 %
Neutro Abs: 6.1 10*3/uL (ref 1.7–7.7)
Neutrophils Relative %: 77 %
Platelets: 159 10*3/uL (ref 150–400)
RBC: 4.1 MIL/uL (ref 3.87–5.11)
RDW: 14 % (ref 11.5–15.5)
WBC: 7.9 10*3/uL (ref 4.0–10.5)
nRBC: 0 % (ref 0.0–0.2)

## 2018-11-06 LAB — COMPREHENSIVE METABOLIC PANEL
ALT: 36 U/L (ref 0–44)
AST: 41 U/L (ref 15–41)
Albumin: 3.5 g/dL (ref 3.5–5.0)
Alkaline Phosphatase: 52 U/L (ref 38–126)
Anion gap: 9 (ref 5–15)
BUN: 31 mg/dL — AB (ref 8–23)
CO2: 26 mmol/L (ref 22–32)
Calcium: 8.5 mg/dL — ABNORMAL LOW (ref 8.9–10.3)
Chloride: 106 mmol/L (ref 98–111)
Creatinine, Ser: 2.12 mg/dL — ABNORMAL HIGH (ref 0.44–1.00)
GFR calc Af Amer: 26 mL/min — ABNORMAL LOW (ref >60)
GFR calc non Af Amer: 23 mL/min — ABNORMAL LOW (ref >60)
Glucose, Bld: 142 mg/dL — ABNORMAL HIGH (ref 70–99)
Potassium: 4.7 mmol/L (ref 3.5–5.1)
SODIUM: 141 mmol/L (ref 135–145)
Total Bilirubin: 0.8 mg/dL (ref 0.3–1.2)
Total Protein: 6.8 g/dL (ref 6.5–8.1)

## 2018-11-06 LAB — TROPONIN I: Troponin I: <0.03 ng/mL (ref <0.03)

## 2018-11-06 MED ORDER — PANTOPRAZOLE SODIUM 40 MG PO TBEC
40.0000 mg | DELAYED_RELEASE_TABLET | Freq: Every day | ORAL | Status: DC
  Administered 2018-11-07 – 2018-11-17 (×11): 40 mg via ORAL
  Filled 2018-11-06 (×12): qty 1

## 2018-11-06 MED ORDER — SODIUM CHLORIDE 0.9% FLUSH
3.0000 mL | Freq: Two times a day (BID) | INTRAVENOUS | Status: DC
  Administered 2018-11-07 – 2018-11-29 (×31): 3 mL via INTRAVENOUS

## 2018-11-06 MED ORDER — HEPARIN SODIUM (PORCINE) 5000 UNIT/ML IJ SOLN
5000.0000 [IU] | Freq: Three times a day (TID) | INTRAMUSCULAR | Status: DC
  Administered 2018-11-06 – 2018-11-30 (×67): 5000 [IU] via SUBCUTANEOUS
  Filled 2018-11-06 (×68): qty 1

## 2018-11-06 MED ORDER — LEVOFLOXACIN IN D5W 750 MG/150ML IV SOLN
750.0000 mg | INTRAVENOUS | Status: DC
  Administered 2018-11-06: 750 mg via INTRAVENOUS
  Filled 2018-11-06 (×3): qty 150

## 2018-11-06 MED ORDER — ALBUTEROL (5 MG/ML) CONTINUOUS INHALATION SOLN
10.0000 mg/h | INHALATION_SOLUTION | Freq: Once | RESPIRATORY_TRACT | Status: AC
  Administered 2018-11-06: 10 mg/h via RESPIRATORY_TRACT
  Filled 2018-11-06: qty 20

## 2018-11-06 MED ORDER — CYCLOBENZAPRINE HCL 10 MG PO TABS
5.0000 mg | ORAL_TABLET | Freq: Once | ORAL | Status: AC
  Administered 2018-11-06: 5 mg via ORAL
  Filled 2018-11-06: qty 1

## 2018-11-06 MED ORDER — HYDRALAZINE HCL 25 MG PO TABS
25.0000 mg | ORAL_TABLET | Freq: Three times a day (TID) | ORAL | Status: DC
  Administered 2018-11-06 – 2018-11-17 (×29): 25 mg via ORAL
  Filled 2018-11-06 (×35): qty 1

## 2018-11-06 MED ORDER — AMLODIPINE BESYLATE 10 MG PO TABS
10.0000 mg | ORAL_TABLET | Freq: Every day | ORAL | Status: DC
  Administered 2018-11-07 – 2018-11-10 (×4): 10 mg via ORAL
  Filled 2018-11-06 (×4): qty 1

## 2018-11-06 MED ORDER — LABETALOL HCL 100 MG PO TABS
100.0000 mg | ORAL_TABLET | Freq: Two times a day (BID) | ORAL | Status: DC
  Administered 2018-11-06 – 2018-11-17 (×21): 100 mg via ORAL
  Filled 2018-11-06 (×22): qty 1

## 2018-11-06 MED ORDER — METHYLPREDNISOLONE SODIUM SUCC 125 MG IJ SOLR
60.0000 mg | Freq: Four times a day (QID) | INTRAMUSCULAR | Status: DC
  Administered 2018-11-06 – 2018-11-09 (×10): 60 mg via INTRAVENOUS
  Filled 2018-11-06 (×10): qty 2

## 2018-11-06 MED ORDER — IPRATROPIUM-ALBUTEROL 0.5-2.5 (3) MG/3ML IN SOLN
3.0000 mL | Freq: Four times a day (QID) | RESPIRATORY_TRACT | Status: DC
  Administered 2018-11-06: 3 mL via RESPIRATORY_TRACT
  Filled 2018-11-06: qty 3

## 2018-11-06 MED ORDER — IPRATROPIUM-ALBUTEROL 0.5-2.5 (3) MG/3ML IN SOLN
3.0000 mL | RESPIRATORY_TRACT | Status: DC
  Administered 2018-11-06 – 2018-11-08 (×12): 3 mL via RESPIRATORY_TRACT
  Filled 2018-11-06 (×14): qty 3

## 2018-11-06 MED ORDER — LEVOFLOXACIN IN D5W 750 MG/150ML IV SOLN: 750.0000 mg | INTRAVENOUS | Status: DC

## 2018-11-06 NOTE — ED Notes (Signed)
Patient removed from BiPAP machine to eat and drink per MD. Oxygen sats ranging from 93-100% on 4L [place].

## 2018-11-06 NOTE — ED Notes (Signed)
Bed: WA12 Expected date:  Expected time:  Means of arrival:  Comments: EMS/shob  

## 2018-11-06 NOTE — H&P (Signed)
History and Physical   Victoria Bird:[id number] DOB: [birth year] DOA: 11/06/2018  PCP: Hoyt Koch, MD  Chief Complaint: shortness of breath  HPI: This is a 73 year old woman with medical problems including COPD (very severe obstruction on spirometry in 04/2018), chronic kidney disease stage III vs IV, former smoker, and chronic hypoxic respiratory failure with a baseline 2 L oxygen requirement, and hypertension presenting with shortness of breath.  History is obtained via patient, discussion with the emergency medicine team, and chart review.  The patient was recently admitted from January 23 through October 28, 2018, where she was treated for COPD exacerbation in the setting of pneumonia.  The patient reports she had residual dyspnea upon arrival home, however over the past 24 to 48 hours her symptoms have worsened.  Specifically, she reports that at baseline she lives with her grandson and is independent in her ADLs, and walks with a 4 wheeled walker.  She reports [place].  She reports developing dyspnea at rest as well as a sense of generalized not feeling well including generalized pain.  Associated symptoms include subjective fevers.  She denies any cough or increased sputum production, she confirms that she is taking Spiriva as well as a rapid acting bronchodilator at home, is on 2 L nasal cannula O2.  She denies any nausea, vomiting, hemoptysis, syncope.  ED Course: The emergency medicine team relayed that the patient received IV Solu-Medrol in route, in addition oxygen saturations were found to be in the 70s by EMS.  Upon arrival to the emergency department, vital signs remarkable for respiratory rate of 42, heart rate of 96, systolic blood pressure ranging from 118-136.  Diagnostics obtained include a CBC remarkable for white count of 7.9, hemoglobin 11.1, platelets within normal limits, CMP remarkable for BUN of 31, creatinine of  2.1.  Troponin within normal limits.  A venous blood gas was obtained revealing pH of 7.316, PaCO2 of 55.2.  The patient was given a continuous nebulizer therapy.  A chest x-ray was obtained which did not reveal acute abnormality.  The patient was placed on BiPAP for respiratory support given her respiratory distress.  Hospital medicine was consulted for further management.  Review of Systems: A complete ROS was obtained; pertinent positives negatives are denoted in the HPI. Otherwise, all systems are negative.   Past Medical History:  Diagnosis Date  . Anxiety   . Chronic pain   . Colon polyps    adenomatous  . COPD (chronic obstructive pulmonary disease) ([place])   . Diabetes mellitus   . Diverticulitis   . Diverticulosis   . Emphysema lung ([place])   . Gall stones   . GERD (gastroesophageal reflux disease)   . H/O: pneumonia   . High cholesterol   . Hypertension   . OSA (obstructive sleep apnea)    no cpap  . Tracheobronchitis 01/01/2012   Social History   Socioeconomic History  . Marital status: Divorced    Spouse name: Not on file  . Number of children: 3  . Years of education: GED  . Highest education level: Not on file  Occupational History  . Occupation: Disability  Social Needs  . Financial resource strain: Not hard at all  . Food insecurity:    Worry: Never true    Inability: Never true  . Transportation needs:    Medical: No    Non-medical: No  Tobacco Use  . Smoking status: Former Smoker  Packs/day: 1.50    Years: 41.00    Pack years: 61.50    Types: Cigarettes    Start date: 03/19/1974    Last attempt to quit: 11/06/2017    Years since quitting: 1.0  . Smokeless tobacco: Never Used  . Tobacco comment: daily amount of cigarettes varies depending on stress level   Substance and Sexual Activity  . Alcohol use: No    Alcohol/week: 0.0 standard drinks  . Drug use: No  . Sexual activity: Never    Birth control/protection: Post-menopausal  Lifestyle  .  Physical activity:    Days per week: 0 days    Minutes per session: 0 min  . Stress: Not at all  Relationships  . Social connections:    Talks on phone: More than three times a week    Gets together: More than three times a week    Attends religious service: 1 to 4 times per year    Active member of club or organization: Yes    Attends meetings of clubs or organizations: 1 to 4 times per year    Relationship status: Not on file  . Intimate partner violence:    Fear of current or ex partner: Not on file    Emotionally abused: Not on file    Physically abused: Not on file    Forced sexual activity: Not on file  Other Topics Concern  . Not on file  Social History Narrative   Daughter Victoria Bird) [phone]   Originally from [place]. Previously lived in [place]. She has traveled from [facility] to [place] & has also traveled across country to [place]. Multiple visits to [place], [place], [place], [place], [place]. She has a dog at home. No bird, mold, or hot tub exposure. She reports some years ago she did live in a home that had black mold in her closet. 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       Pt lives with her youngest daughter, lives in a trailer, does have stairs into home. Does have some troubled with that. Highest level of education is GED.    Family History  Problem Relation Age of Onset  . Heart attack Mother   . Diabetes Maternal Grandfather   . Heart attack Maternal Grandfather   . Hypertension Maternal Grandfather   . Stroke Maternal Grandfather   . Heart attack Maternal Grandmother   . Hypertension Maternal Grandmother   . Alcohol abuse Father   . Thyroid disease Neg Hx   . Lung disease Neg Hx   . Colon cancer Neg Hx   . Breast cancer Neg Hx     Physical Exam: Vitals:   11/06/18 1645 11/06/18 1649 11/06/18 1828  BP: 136/60 136/60 118/68  Pulse: 96 96 99    Resp: (!) 38 (!) 42 (!) 31  SpO2: 100% 98% 93%   General: White woman, appears in mild distress secondary to dyspnea, but is alert oriented  ENT: No scleral icterus Cardiovascular: Heart sounds are distant, and difficult to hear over wheezing, S1-S2 unremarkable, no lower extremity edema.  Respiratory: Respiratory rate increased, roughly 30 breaths/min, she is able to make complete sentences and answer questions, but with prolonged responses must take a break to catch her breath, on BiPAP with an FiO2 of 30%, IPAP of 12, EPAP of 5.  Diffuse wheezes throughout, scattered rhonchi. Abdomen: Soft, non-tender.  Well-healed right upper quadrant scar Skin: No rash or induration seen on limited exam. Musculoskeletal: Grossly normal tone BUE/BLE. Appropriate ROM Psychiatric: Grossly normal mood and affect. Neurologic: Moves all extremities in coordinated fashion.  I have personally reviewed the following labs, culture data, and imaging studies.  Assessment/Plan:  #Acue on chronic hypoxic respiratory failure secondary to COPD exacerbation, suspected Course: Was admitted at end of 10/2018 for COPD exacerbation in setting of pneumonia; she reports never reaching her respiratory baseline before decompensation requiring EMS dispatch.  Symptoms of dyspnea at rest and subjective fever, found to have O2 sat in 70s upon EMS arrival and significant tachypnea with RR in 30-40s.; home O2 requirement of 2 L O2 A/P: although no productive cough or increased purulence, no other clearly identifiable cause evident for trigger of this episode; given presence of diffuse wheezing - suspect COPD exacerbation.  Important to investigate for atypical / viral pathogens with RVP.  Will continue BIPAP for respiratory support in the step-down setting with antimicrobial coverage (fluroquinolone given allergies to other antimicrobials), scheduled nebulized bronchodilators, and continuation of systemic steroids with IV  methylprednisolone (optimal tapering / schedule to be determined based on clinical course - she reports prednisone allergy).  A pulmonary embolus was considered; however, in the setting of diffuse wheezing and COPD exacerbation as likely driver to symptoms, this is less likely; however, if she fails to improve may be reasonable to consider evaluation for such as a concomitant contributor to her symptoms. Will obtain TTE to evaluate for any evidence of HF (particularly right sided).   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

## 2018-11-06 NOTE — Progress Notes (Signed)
PHARMACY NOTE:  ANTIMICROBIAL RENAL DOSAGE ADJUSTMENT  Current antimicrobial regimen includes a mismatch between antimicrobial dosage and estimated renal function.  As per policy approved by the Pharmacy & Therapeutics and Medical Executive Committees, the antimicrobial dosage will be adjusted accordingly.  Current antimicrobial dosage:  Levaquin 750mg  IV q24h x 5 days  Indication: COPD exacerbation  Renal Function:  Estimated Creatinine Clearance: 23.9 mL/min (A) (by C-G formula based on SCr of 2.12 mg/dL (H)).  Antimicrobial dosage has been changed to:  Levaquin 750mg  IV q48h x 5 days (for CrCl 20-49 ml/min)   Thank you for allowing pharmacy to be a part of this patient's care.  Everette Rank, [facility] 11/06/2018 7:01 PM

## 2018-11-06 NOTE — ED Triage Notes (Signed)
Per EMS:  pt from home.  Pt recently D/C from hospital stay with PNA.  Pt finished ABX, but had increased fatigue today,  Pt hx COPD and on 2L [place] at home.  Pt desatted to 76% when standing up to sit on stretcher.  10 mg albuterol, 1 mg atrovent, and 125mg   Solumedrol given; magnesium started but stopped due to nausea,  4 mg zofran given.

## 2018-11-06 NOTE — ED Provider Notes (Signed)
[facility] DEPT Provider Note   CSN: [id number] Arrival date & time: 11/06/18  1632     History   Chief Complaint Chief Complaint  Patient presents with  . Shortness of Breath    HPI Victoria Bird is a 73 y.o. female.  Pt presents to the ED today with SOB.  The pt said sx have been going on all day.  She wears 2L home O2 all the time, and sats were in the 70s on 2L per EMS.  Pt was given 10 mg albuterol, 1 mg atrovent, 125 mg solumedrol by EMS.  Pt was admitted from 1/23-27 for a COPD exacerbation and CAP.  The pt has finished her abx.       Past Medical History:  Diagnosis Date  . Anxiety   . Chronic pain   . Colon polyps    adenomatous  . COPD (chronic obstructive pulmonary disease) ([place])   . Diabetes mellitus   . Diverticulitis   . Diverticulosis   . Emphysema lung ([place])   . Gall stones   . GERD (gastroesophageal reflux disease)   . H/O: pneumonia   . High cholesterol   . Hypertension   . OSA (obstructive sleep apnea)    no cpap  . Tracheobronchitis 01/01/2012    Patient Active Problem List   Diagnosis Date Noted  . PNA (pneumonia) 10/24/2018  . Neck pain 07/29/2018  . Right lower quadrant abdominal pain 07/29/2018  . Swallowing problem 07/29/2018  . Lung nodules 04/18/2018  . Chronic respiratory failure with hypoxia ([place]) 04/18/2018  . Diastolic CHF ([place]) 75/64/3329  . COPD with acute exacerbation ([place]) 11/08/2017  . Vertigo 03/03/2017  . Fibromyalgia 01/18/2017  . Tobacco abuse 12/17/2016  . CKD (chronic kidney disease), stage III ([place]) 12/15/2016  . AAA (abdominal aortic aneurysm) without rupture ([place]) 06/08/2016  . Right-sided low back pain without sciatica   . COPD exacerbation ([place])   . Adjustment disorder with mixed anxiety and depressed mood 02/25/2016  . Overweight (BMI 25.0-29.9) 01/25/2016  . Allergic rhinitis 11/17/2015  . Tobacco use disorder 06/22/2015  . Diabetes mellitus type 2, controlled ([place])  06/16/2015  . H/O recurrent pneumonia 05/27/2015  . GERD (gastroesophageal reflux disease) 05/27/2015  . Hyperlipidemia 05/27/2015  . Chronic pain syndrome 05/27/2015  . Thyroid nodule 01/20/2015  . COPD (chronic obstructive pulmonary disease) ([place]) 10/09/2014  . Hypertension 12/14/2011  . OSA (obstructive sleep apnea) 10/02/2008    Past Surgical History:  Procedure Laterality Date  . ABDOMINAL HYSTERECTOMY    . APPENDECTOMY    . Bilateral shoulder surgery    . CARPAL TUNNEL RELEASE Left   . CATARACT EXTRACTION    . CHOLECYSTECTOMY    . COLONOSCOPY    . ESOPHAGOGASTRODUODENOSCOPY    . FRACTURE SURGERY    . Right knee surgery    . ROTATOR CUFF REPAIR       OB History   No obstetric history on file.      Home Medications    Prior to Admission medications   Medication Sig Start Date End Date Taking? Authorizing Provider  albuterol (ACCUNEB) 0.63 MG/3ML nebulizer solution Take 1 ampule by nebulization every 6 (six) hours as needed for wheezing or shortness of breath.    [provider]  albuterol (PROAIR HFA) 108 (90 Base) MCG/ACT inhaler Inhale 2 puffs into the lungs every 6 (six) hours as needed for wheezing or shortness of breath. 11/02/17   Parrett, Fonnie Mu, NP  amLODipine (NORVASC) 10  MG tablet Take 1 tablet (10 mg total) by mouth daily. 03/21/18   Hoyt Koch, MD  benzonatate (TESSALON PERLES) 100 MG capsule Take 1 capsule (100 mg total) by mouth every 6 (six) hours as needed for cough. 10/28/18 10/28/19  Purohit, Konrad Dolores, MD  blood glucose meter kit and supplies KIT Use to test blood sugar 08/30/17   Hoyt Koch, MD  cyclobenzaprine (FLEXERIL) 5 MG tablet TAKE 1 TABLET(5 MG) BY MOUTH THREE TIMES DAILY AS NEEDED FOR MUSCLE SPASMS Patient taking differently: Take 5 mg by mouth 3 (three) times daily as needed for muscle spasms.  08/22/18   Hoyt Koch, MD  furosemide (LASIX) 20 MG tablet Take 1 tablet (20 mg total) by mouth daily. 05/27/18    Parrett, Fonnie Mu, NP  furosemide (LASIX) 40 MG tablet Take 1 tablet (40 mg total) by mouth daily for 7 days. 04/10/18 04/17/18  Juanito Doom, MD  glucose blood (IGLUCOSE TEST STRIPS) test strip Use as instructed 08/30/17   Hoyt Koch, MD  guaifenesin (HUMIBID E) 400 MG TABS tablet Take 400 mg by mouth 2 (two) times daily.    [provider]  hydrALAZINE (APRESOLINE) 25 MG tablet Take 1 tablet (25 mg total) by mouth every 8 (eight) hours. 01/08/18 10/24/18  Hoyt Koch, MD  labetalol (NORMODYNE) 100 MG tablet Take 1 tablet (100 mg total) by mouth 2 (two) times daily. 03/21/18   Hoyt Koch, MD  Lancets MISC Use to test blood sugar one daily 08/30/17   Hoyt Koch, MD  pantoprazole (PROTONIX) 40 MG tablet TAKE 1 TABLET(40 MG) BY MOUTH DAILY Patient taking differently: Take 40 mg by mouth daily.  07/05/18   Hoyt Koch, MD  tiotropium (SPIRIVA HANDIHALER) 18 MCG inhalation capsule Place 1 capsule (18 mcg total) into inhaler and inhale daily. 04/13/17   Javier Glazier, MD    Family History Family History  Problem Relation Age of Onset  . Heart attack Mother   . Diabetes Maternal Grandfather   . Heart attack Maternal Grandfather   . Hypertension Maternal Grandfather   . Stroke Maternal Grandfather   . Heart attack Maternal Grandmother   . Hypertension Maternal Grandmother   . Alcohol abuse Father   . Thyroid disease Neg Hx   . Lung disease Neg Hx   . Colon cancer Neg Hx   . Breast cancer Neg Hx     Social History Social History   Tobacco Use  . Smoking status: Former Smoker    Packs/day: 1.50    Years: 41.00    Pack years: 61.50    Types: Cigarettes    Start date: 03/19/1974    Last attempt to quit: 11/06/2017    Years since quitting: 1.0  . Smokeless tobacco: Never Used  . Tobacco comment: daily amount of cigarettes varies depending on stress level   Substance Use Topics  . Alcohol use: No    Alcohol/week: 0.0  standard drinks  . Drug use: No     Allergies   Aspartame and phenylalanine; Doxycycline; Tramadol; Codeine; Neurontin [gabapentin]; Sulfa antibiotics; Symbicort [budesonide-formoterol fumarate]; Penicillins; and Prednisone   Review of Systems Review of Systems  Respiratory: Positive for cough, shortness of breath and wheezing.   All other systems reviewed and are negative.    Physical Exam Updated Vital Signs BP 136/60   Pulse 96   Resp (!) 42   SpO2 98%   Physical Exam Vitals signs and nursing note reviewed.  Constitutional:      General: She is in acute distress.     Appearance: She is well-developed.  HENT:     Head: Normocephalic and atraumatic.     Mouth/Throat:     Mouth: Mucous membranes are moist.     Pharynx: Oropharynx is clear.  Eyes:     Extraocular Movements: Extraocular movements intact.     Pupils: Pupils are equal, round, and reactive to light.  Neck:     Musculoskeletal: Normal range of motion and neck supple.  Cardiovascular:     Rate and Rhythm: Regular rhythm. Tachycardia present.  Pulmonary:     Effort: Tachypnea, accessory muscle usage and respiratory distress present.     Breath sounds: Wheezing present.  Abdominal:     General: Bowel sounds are normal.     Palpations: Abdomen is soft.  Musculoskeletal: Normal range of motion.  Skin:    General: Skin is warm and dry.     Capillary Refill: Capillary refill takes less than 2 seconds.  Neurological:     General: No focal deficit present.     Mental Status: She is alert and oriented to person, place, and time.  Psychiatric:        Mood and Affect: Mood normal.        Behavior: Behavior normal.      ED Treatments / Results  Labs (all labs ordered are listed, but only abnormal results are displayed) Labs Reviewed  COMPREHENSIVE METABOLIC PANEL - Abnormal; Notable for the following components:      Result Value   Glucose, Bld 142 (*)    BUN 31 (*)    Creatinine, Ser 2.12 (*)     Calcium 8.5 (*)    GFR calc non Af Amer 23 (*)    GFR calc Af Amer 26 (*)    All other components within normal limits  CBC WITH DIFFERENTIAL/PLATELET - Abnormal; Notable for the following components:   Hemoglobin 11.1 (*)    MCHC 29.4 (*)    All other components within normal limits  BLOOD GAS, ARTERIAL - Abnormal; Notable for the following components:   pH, Arterial 7.316 (*)    pCO2 arterial 55.2 (*)    pO2, Arterial 38.5 (*)    All other components within normal limits  TROPONIN I  URINALYSIS, ROUTINE W REFLEX MICROSCOPIC    EKG None  Radiology Dg Chest Port 1 View  Result Date: 11/06/2018 CLINICAL DATA:  Worsening fatigue. Recent pneumonia. History of COPD. EXAM: PORTABLE CHEST 1 VIEW COMPARISON:  Chest radiograph October 26, 2018 FINDINGS: Cardiomediastinal silhouette is normal. Patient rotated to the RIGHT. Calcified aortic arch. Mild chronic interstitial changes increased lung volumes without pleural effusion or focal consolidation. No pneumothorax. Soft tissue planes and included osseous structures are non suspicious. IMPRESSION: No acute cardiopulmonary process. Emphysema (ICD10-J43.9). Aortic Atherosclerosis (ICD10-I70.0). Electronically Signed   By: Elon Alas M.D.   On: 11/06/2018 17:03    Procedures Procedures (including critical care time)  Medications Ordered in ED Medications  albuterol (PROVENTIL,VENTOLIN) solution continuous neb (10 mg/hr Nebulization Given 11/06/18 1657)     Initial Impression / Assessment and Plan / ED Course  I have reviewed the triage vital signs and the nursing notes.  Pertinent labs & imaging results that were available during my care of the patient were reviewed by me and considered in my medical decision making (see chart for details).    CRITICAL CARE Performed by: Isla Pence   Total critical care time: 2  minutes  Critical care time was exclusive of separately billable procedures and treating other  patients.  Critical care was necessary to treat or prevent imminent or life-threatening deterioration.  Critical care was time spent personally by me on the following activities: development of treatment plan with patient and/or surrogate as well as nursing, discussions with consultants, evaluation of patient's response to treatment, examination of patient, obtaining history from patient or surrogate, ordering and performing treatments and interventions, ordering and review of laboratory studies, ordering and review of radiographic studies, pulse oximetry and re-evaluation of patient's condition.  Pt placed on bipap upon arrival due to extreme sob.  The bipap has improved pt, but she remains tachypneic.  She does not have pna on cxr.  She has been given multiple nebs and solumedrol.  Wheezing has improved. Pt d/w Dr. Stana Bunting ([facility]) for admission.  Final Clinical Impressions(s) / ED Diagnoses   Final diagnoses:  COPD exacerbation ([place])  Acute respiratory failure with hypoxia [facility])    ED Discharge Orders    None       Isla Pence, MD 11/06/18 1806

## 2018-11-07 ENCOUNTER — Inpatient Hospital Stay (HOSPITAL_COMMUNITY): Payer: Medicare Other

## 2018-11-07 ENCOUNTER — Other Ambulatory Visit: Payer: Self-pay

## 2018-11-07 DIAGNOSIS — J441 Chronic obstructive pulmonary disease with (acute) exacerbation: Secondary | ICD-10-CM | POA: Diagnosis present

## 2018-11-07 DIAGNOSIS — R0602 Shortness of breath: Secondary | ICD-10-CM

## 2018-11-07 LAB — RESPIRATORY PANEL BY PCR
ADENOVIRUS-RVPPCR: NOT DETECTED
Bordetella pertussis: NOT DETECTED
CORONAVIRUS NL63-RVPPCR: NOT DETECTED
Chlamydophila pneumoniae: NOT DETECTED
Coronavirus 229E: NOT DETECTED
Coronavirus HKU1: NOT DETECTED
Coronavirus OC43: NOT DETECTED
Influenza A: NOT DETECTED
Influenza B: NOT DETECTED
Metapneumovirus: DETECTED — AB
Mycoplasma pneumoniae: NOT DETECTED
Parainfluenza Virus 1: NOT DETECTED
Parainfluenza Virus 2: NOT DETECTED
Parainfluenza Virus 3: NOT DETECTED
Parainfluenza Virus 4: NOT DETECTED
Respiratory Syncytial Virus: NOT DETECTED
Rhinovirus / Enterovirus: NOT DETECTED

## 2018-11-07 LAB — CBC WITH DIFFERENTIAL/PLATELET
Abs Immature Granulocytes: 0.03 10*3/uL (ref 0.00–0.07)
Basophils Absolute: 0 10*3/uL (ref 0.0–0.1)
Basophils Relative: 0 %
Eosinophils Absolute: 0 10*3/uL (ref 0.0–0.5)
Eosinophils Relative: 0 %
HCT: 33.2 % — ABNORMAL LOW (ref 36.0–46.0)
Hemoglobin: 10.3 g/dL — ABNORMAL LOW (ref 12.0–15.0)
Immature Granulocytes: 1 %
LYMPHS PCT: 7 %
Lymphs Abs: 0.4 10*3/uL — ABNORMAL LOW (ref 0.7–4.0)
MCH: 27.6 pg (ref 26.0–34.0)
MCHC: 31 g/dL (ref 30.0–36.0)
MCV: 89 fL (ref 80.0–100.0)
Monocytes Absolute: 0.1 10*3/uL (ref 0.1–1.0)
Monocytes Relative: 2 %
Neutro Abs: 4.7 10*3/uL (ref 1.7–7.7)
Neutrophils Relative %: 90 %
Platelets: 146 10*3/uL — ABNORMAL LOW (ref 150–400)
RBC: 3.73 MIL/uL — ABNORMAL LOW (ref 3.87–5.11)
RDW: 14.1 % (ref 11.5–15.5)
WBC: 5.2 10*3/uL (ref 4.0–10.5)
nRBC: 0 % (ref 0.0–0.2)

## 2018-11-07 LAB — BASIC METABOLIC PANEL
ANION GAP: 10 (ref 5–15)
BUN: 46 mg/dL — ABNORMAL HIGH (ref 8–23)
CALCIUM: 8.5 mg/dL — AB (ref 8.9–10.3)
CO2: 24 mmol/L (ref 22–32)
Chloride: 105 mmol/L (ref 98–111)
Creatinine, Ser: 2.34 mg/dL — ABNORMAL HIGH (ref 0.44–1.00)
GFR calc Af Amer: 23 mL/min — ABNORMAL LOW (ref >60)
GFR calc non Af Amer: 20 mL/min — ABNORMAL LOW (ref >60)
Glucose, Bld: 127 mg/dL — ABNORMAL HIGH (ref 70–99)
Potassium: 5.1 mmol/L (ref 3.5–5.1)
Sodium: 139 mmol/L (ref 135–145)

## 2018-11-07 LAB — ECHOCARDIOGRAM COMPLETE
Height: 63 in
Weight: 2582.03 oz

## 2018-11-07 MED ORDER — CYCLOBENZAPRINE HCL 5 MG PO TABS
5.0000 mg | ORAL_TABLET | Freq: Three times a day (TID) | ORAL | Status: DC | PRN
  Administered 2018-11-07 – 2018-11-15 (×8): 5 mg via ORAL
  Filled 2018-11-07 (×8): qty 1

## 2018-11-07 NOTE — Progress Notes (Signed)
PT Cancellation Note  Patient Details Name: Victoria Bird MRN: [id number] DOB: August 19, 1946   Cancelled Treatment:    Reason Eval/Treat Not Completed: Fatigue/lethargy limiting ability to participate(Pt very dyspneic upon PT arrival, and states she is too exhausted to participate in PT today. Pt requests PT see her tomorrow. PT to continue to follow acutely. )  Julien Girt, PT Acute Rehabilitation Services Pager [phone]  Office [place] 11/07/2018, 3:04 PM

## 2018-11-07 NOTE — ED Notes (Signed)
ED TO INPATIENT HANDOFF REPORT  Name/Age/Gender Victoria Bird 73 y.o. female  Code Status    Code Status Orders  (From admission, onward)         Start     Ordered   11/06/18 1852  Full code  Continuous     11/06/18 1852        Code Status History    Date Active Date Inactive Code Status Order ID Comments User Context   10/24/2018 2328 10/28/2018 1728 Full Code [id number]  Shelbie Proctor, MD Inpatient   11/08/2017 0531 11/20/2017 2233 Full Code [id number]  Etta Quill, DO ED   03/12/2017 2126 03/21/2017 2121 Full Code [id number]  Etta Quill, DO ED   03/03/2017 1205 03/03/2017 2059 Full Code [id number]  [name], MD Inpatient   12/16/2016 0351 12/19/2016 1945 Full Code [id number]  [name], MD Inpatient   06/08/2016 0459 06/11/2016 2015 Full Code [id number]  [name], MD Inpatient   03/10/2016 1038 03/20/2016 2154 Full Code [id number]  [name], MD Inpatient   12/25/2015 0505 12/31/2015 2012 Full Code [id number]  [name], MD Inpatient   12/12/2015 1302 12/18/2015 1630 Full Code [id number]  [name], MD ED   06/16/2015 2144 06/18/2015 1835 Full Code [id number]  [name], MD Inpatient   10/09/2014 0354 10/12/2014 2010 Full Code [id number]  [name], MD ED   04/10/2013 0602 04/12/2013 2034 Full Code [id number]  [name], MD ED   01/07/2013 2041 01/08/2013 1758 Full Code [id number]  Robbie Lis, MD Inpatient   12/25/2011 0408 01/01/2012 2000 Full Code [id number]  [name], RN Inpatient      Home/SNF/Other Home  Chief Complaint Shortness of breath  Level of Care/Admitting Diagnosis ED Disposition    ED Disposition Condition [facility] Area: [facility] [[id number]]  Level of Care: Telemetry [5]  Admit to tele based on following criteria: Monitor for Ischemic changes  Diagnosis: Acute exacerbation of chronic obstructive pulmonary disease (COPD) [name]) [[id number]]  Admitting  Physician: Kayleen Memos [[id number]]  Attending Physician: Kayleen Memos [phone]  Estimated length of stay: past midnight tomorrow  Certification:: I certify this patient will need inpatient services for at least 2 midnights  PT Class (Do Not Modify): Inpatient [101]  PT Acc Code (Do Not Modify): Private [1]       Medical History Past Medical History:  Diagnosis Date  . Anxiety   . Chronic pain   . Colon polyps    adenomatous  . COPD (chronic obstructive pulmonary disease) ([place])   . Diabetes mellitus   . Diverticulitis   . Diverticulosis   . Emphysema lung ([place])   . Gall stones   . GERD (gastroesophageal reflux disease)   . H/O: pneumonia   . High cholesterol   . Hypertension   . OSA (obstructive sleep apnea)    no cpap  . Tracheobronchitis 01/01/2012    Allergies Allergies  Allergen Reactions  . Aspartame And Phenylalanine Nausea And Vomiting    Patient says she is allergic to all artificial sweeteners  . Doxycycline Other (See Comments)    Nausea, vomiting, HA, double vision  . Tramadol Shortness Of Breath and Nausea Only  . Codeine Itching    Has not tried benadryl to alleviate side effects  . Neurontin [Gabapentin] Other (See Comments)    Dizzy, "drugged" feeling, sleepy  . Sulfa Antibiotics Other (See Comments)    Kidney problem   .  Symbicort [Budesonide-Formoterol Fumarate]     Swelling of face and inside of mouth per pt  . Penicillins Rash    Has patient had a PCN reaction causing immediate rash, facial/tongue/throat swelling, SOB or lightheadedness with hypotension: No Has patient had a PCN reaction causing severe rash involving mucus membranes or skin necrosis: No Has patient had a PCN reaction that required hospitalization No Has patient had a PCN reaction occurring within the last 10 years: No If all of the above answers are "NO", then may proceed with Cephalosporin use.  . Prednisone Rash    Patient stated she received Prednisone while she was in the  hospital and experienced a rash and "extreme pain.'    IV Location/Drains/Wounds Patient Lines/Drains/Airways Status   Active Line/Drains/Airways    Name:   Placement date:   Placement time:   Site:   Days:   Peripheral IV 11/07/18 Right Antecubital   11/07/18    0045    Antecubital   less than 1          Labs/Imaging Results for orders placed or performed during the hospital encounter of 11/06/18 (from the past 48 hour(s))  Comprehensive metabolic panel     Status: Abnormal   Collection Time: 11/06/18  4:51 PM  Result Value Ref Range   Sodium 141 135 - 145 mmol/L   Potassium 4.7 3.5 - 5.1 mmol/L   Chloride 106 98 - 111 mmol/L   CO2 26 22 - 32 mmol/L   Glucose, Bld 142 (H) 70 - 99 mg/dL   BUN 31 (H) 8 - 23 mg/dL   Creatinine, Ser 2.12 (H) 0.44 - 1.00 mg/dL   Calcium 8.5 (L) 8.9 - 10.3 mg/dL   Total Protein 6.8 6.5 - 8.1 g/dL   Albumin 3.5 3.5 - 5.0 g/dL   AST 41 15 - 41 U/L   ALT 36 0 - 44 U/L   Alkaline Phosphatase 52 38 - 126 U/L   Total Bilirubin 0.8 0.3 - 1.2 mg/dL   GFR calc non Af Amer 23 (L) >60 mL/min   GFR calc Af Amer 26 (L) >60 mL/min   Anion gap 9 5 - 15    Comment: Performed at [facility], [place] [address], [place], [place] [address]  CBC WITH DIFFERENTIAL     Status: Abnormal   Collection Time: 11/06/18  4:51 PM  Result Value Ref Range   WBC 7.9 4.0 - 10.5 K/uL   RBC 4.10 3.87 - 5.11 MIL/uL   Hemoglobin 11.1 (L) 12.0 - 15.0 g/dL   HCT 37.7 36.0 - 46.0 %   MCV 92.0 80.0 - 100.0 fL   MCH 27.1 26.0 - 34.0 pg   MCHC 29.4 (L) 30.0 - 36.0 g/dL   RDW 14.0 11.5 - 15.5 %   Platelets 159 150 - 400 K/uL   nRBC 0.0 0.0 - 0.2 %   Neutrophils Relative % 77 %   Neutro Abs 6.1 1.7 - 7.7 K/uL   Lymphocytes Relative 15 %   Lymphs Abs 1.2 0.7 - 4.0 K/uL   Monocytes Relative 8 %   Monocytes Absolute 0.6 0.1 - 1.0 K/uL   Eosinophils Relative 0 %   Eosinophils Absolute 0.0 0.0 - 0.5 K/uL   Basophils Relative 0 %   Basophils Absolute 0.0 0.0 - 0.1  K/uL   Immature Granulocytes 0 %   Abs Immature Granulocytes 0.03 0.00 - 0.07 K/uL    Comment: Performed at [facility], [place]  [address] [place], [place] [address]  Troponin I - ONCE - STAT     Status: None   Collection Time: 11/06/18  4:51 PM  Result Value Ref Range   Troponin I <0.03 <0.03 ng/mL    Comment: Performed at [facility], [place] [address] [place], [place] [address]  Blood gas, arterial ([facility], [facility], [facility])     Status: Abnormal   Collection Time: 11/06/18  4:55 PM  Result Value Ref Range   FIO2 30.00    Delivery systems BILEVEL POSITIVE AIRWAY PRESSURE    Inspiratory PAP 10    Expiratory PAP 5    pH, Arterial 7.316 (L) 7.350 - 7.450   pCO2 arterial 55.2 (H) 32.0 - 48.0 mmHg   pO2, Arterial 38.5 (LL) 83.0 - 108.0 mmHg    Comment:  RBV DR.HAVILAND BY LISA CRADDOCK,RRT,RCP ON 11/06/2018 AT 1705   Bicarbonate 27.4 20.0 - 28.0 mmol/L   Acid-Base Excess 1.0 0.0 - 2.0 mmol/L   O2 Saturation 68.4 %   Patient temperature 98.6    Collection site RIGHT RADIAL    Drawn by [id number]    Sample type ARTERIAL DRAW    Allens test (pass/fail) PASS PASS    Comment: Performed at [facility], [place] [address], [place], [place] [address]  CBC WITH DIFFERENTIAL     Status: Abnormal   Collection Time: 11/07/18  4:52 AM  Result Value Ref Range   WBC 5.2 4.0 - 10.5 K/uL   RBC 3.73 (L) 3.87 - 5.11 MIL/uL   Hemoglobin 10.3 (L) 12.0 - 15.0 g/dL   HCT 33.2 (L) 36.0 - 46.0 %   MCV 89.0 80.0 - 100.0 fL   MCH 27.6 26.0 - 34.0 pg   MCHC 31.0 30.0 - 36.0 g/dL   RDW 14.1 11.5 - 15.5 %   Platelets 146 (L) 150 - 400 K/uL   nRBC 0.0 0.0 - 0.2 %   Neutrophils Relative % 90 %   Neutro Abs 4.7 1.7 - 7.7 K/uL   Lymphocytes Relative 7 %   Lymphs Abs 0.4 (L) 0.7 - 4.0 K/uL   Monocytes Relative 2 %   Monocytes Absolute 0.1 0.1 - 1.0 K/uL   Eosinophils Relative 0 %   Eosinophils Absolute 0.0 0.0 - 0.5 K/uL   Basophils Relative 0 %   Basophils Absolute  0.0 0.0 - 0.1 K/uL   Immature Granulocytes 1 %   Abs Immature Granulocytes 0.03 0.00 - 0.07 K/uL    Comment: Performed at [facility], [place] [address], [place], [place] [address]  Basic metabolic panel     Status: Abnormal   Collection Time: 11/07/18  5:57 AM  Result Value Ref Range   Sodium 139 135 - 145 mmol/L   Potassium 5.1 3.5 - 5.1 mmol/L   Chloride 105 98 - 111 mmol/L   CO2 24 22 - 32 mmol/L   Glucose, Bld 127 (H) 70 - 99 mg/dL   BUN 46 (H) 8 - 23 mg/dL   Creatinine, Ser 2.34 (H) 0.44 - 1.00 mg/dL   Calcium 8.5 (L) 8.9 - 10.3 mg/dL   GFR calc non Af Amer 20 (L) >60 mL/min   GFR calc Af Amer 23 (L) >60 mL/min   Anion gap 10 5 - 15    Comment: Performed at [facility], [place] [address] [place], [place] [address]   Dg Chest Port 1 View  Result Date: 11/06/2018 CLINICAL DATA:  Worsening fatigue. Recent pneumonia. History of COPD.  EXAM: PORTABLE CHEST 1 VIEW COMPARISON:  Chest radiograph October 26, 2018 FINDINGS: Cardiomediastinal silhouette is normal. Patient rotated to the RIGHT. Calcified aortic arch. Mild chronic interstitial changes increased lung volumes without pleural effusion or focal consolidation. No pneumothorax. Soft tissue planes and included osseous structures are non suspicious. IMPRESSION: No acute cardiopulmonary process. Emphysema (ICD10-J43.9). Aortic Atherosclerosis (ICD10-I70.0). Electronically Signed   By: Elon Alas M.D.   On: 11/06/2018 17:03   None  Pending Labs Unresulted Labs (From admission, onward)    Start     Ordered   11/06/18 1852  Respiratory Panel by PCR  (Respiratory virus panel with precautions)  Once,   R     11/06/18 1852   11/06/18 1642  Urinalysis, Routine w reflex microscopic  ONCE - STAT,   STAT     11/06/18 1642          Vitals/Pain Today's Vitals   11/07/18 0930 11/07/18 1000 11/07/18 1030 11/07/18 1100  BP: (!) 110/53 127/61 125/61 125/61  Pulse: 85 74 79 83  Resp: (!) 28 (!) 32  (!) 25 (!) 37  SpO2: 93% 94% 94% 93%  PainSc:        Isolation Precautions Droplet precaution  Medications Medications  amLODipine (NORVASC) tablet 10 mg (10 mg Oral Given 11/07/18 0930)  hydrALAZINE (APRESOLINE) tablet 25 mg (25 mg Oral Given 11/07/18 7793)  labetalol (NORMODYNE) tablet 100 mg (100 mg Oral Given 11/07/18 0930)  pantoprazole (PROTONIX) EC tablet 40 mg (40 mg Oral Given 11/07/18 0930)  heparin injection 5,000 Units (5,000 Units Subcutaneous Given 11/07/18 9030)  sodium chloride flush (NS) 0.9 % injection 3 mL (3 mLs Intravenous Given 11/07/18 0930)  methylPREDNISolone sodium succinate (SOLU-MEDROL) 125 mg/2 mL injection 60 mg (60 mg Intravenous Given 11/07/18 0751)  levofloxacin (LEVAQUIN) IVPB 750 mg (0 mg Intravenous Stopped 11/07/18 0359)  ipratropium-albuterol (DUONEB) 0.5-2.5 (3) MG/3ML nebulizer solution 3 mL (3 mLs Nebulization Given 11/07/18 0758)  cyclobenzaprine (FLEXERIL) tablet 5 mg (has no administration in time range)  albuterol (PROVENTIL,VENTOLIN) solution continuous neb (10 mg/hr Nebulization Given 11/06/18 1657)  cyclobenzaprine (FLEXERIL) tablet 5 mg (5 mg Oral Given 11/06/18 2220)    Mobility walks with person assist

## 2018-11-07 NOTE — Progress Notes (Signed)
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  Upon arrival to the emergency department, vital signs remarkable for respiratory rate of 42, heart rate of 96, systolic blood pressure ranging from 118-136.  chest x-ray negative on admission, initially placed on BiPAP for respiratory support, stepdown,off BiPAP and transferred to telemetry  Assessment and plan #Acue on chronic hypoxic respiratory failure secondary to COPD exacerbation, suspected Course: Was admitted at end of 10/2018 for COPD exacerbation in setting of pneumonia; she reports never reaching her respiratory baseline before decompensation requiring EMS dispatch.  Symptoms of dyspnea at rest and subjective fever, found to have O2 sat in 70s upon EMS arrival and significant tachypnea with RR in 30-40s.; home O2 requirement of 2 L O2 although no productive cough or increased purulence, no other clearly identifiable cause evident for trigger of this episode; given presence of diffuse wheezing - suspect COPD exacerbation. Initially  placed on BIPAP for respiratory support in the step-down setting with antimicrobial coverage (fluroquinolone given allergies to other antimicrobials), scheduled nebulized bronchodilators, and continuation of systemic steroids with IV methylprednisolone .  A pulmonary embolus was considered; however, in the setting of diffuse wheezing and COPD exacerbation as likely driver to symptoms, this is less likely; however, if she fails to improve may be reasonable to consider evaluation for such as a concomitant contributor to her symptoms.   TTE to evaluate for any evidence of HF (particularly right sided).  Respiratory panel pending    -HTN: will continue home amlodipine, hydralazine, and labetalol; hold home furosemide  -Former smoker: congratulated, stopped since ~11/2017 per her report  -CKD: baseline CR ~2, continue to monitor  AKI creatinine is around 2 at baseline,[trending up for the last 1 year] currently 2.34. Will continue IV fluids and see the patient's creatinine improves, likely prerenal    DVT prophylaxsis heparin  Code Status:  Full code   Family Communication: Discussed in detail with the patient, all imaging results, lab results explained to the patient   Disposition Plan:   Anticipate discharge in the next 24-48 hours of symptomatic improvement     Consultants:  None   Procedures:  None   Antibiotics: Anti-infectives (From admission, onward)   Start     Dose/Rate Route Frequency Ordered Stop   11/06/18 1930  levofloxacin (LEVAQUIN) IVPB 750 mg     750 mg 100 mL/hr over 90 Minutes Intravenous Every 48 hours 11/06/18 1901 11/12/18 1929   11/06/18 1900  levofloxacin (LEVAQUIN) IVPB 750 mg  Status:  Discontinued     750 mg 100 mL/hr over 90 Minutes Intravenous Every 24 hours 11/06/18 1852 11/06/18 1900         HPI/Subjective: She does not feel any better today than yesterday   Objective: Vitals:   11/07/18 1130 11/07/18 1140 11/07/18 1200 11/07/18 1242  BP: 119/65  126/62 (!) 145/66  Pulse: 87  72 82  Resp: (!) 32  (!) 30 (!) 25  Temp:    98.7 F (37.1 C)  SpO2: 94% 94% 93% 93%  Weight:    73.2 kg  Height:    5\' 3"  (1.6 m)    Intake/Output Summary (Last 24 hours) at 11/07/2018 1336 Last data filed  at 11/07/2018 0359 Gross per 24 hour  Intake 146.36 ml  Output -  Net 146.36 ml    Exam:  Examination:  General exam: Appears calm and comfortable  Respiratory system: wheezing  On auscultation.increased  Respiratory effort normal. Cardiovascular system: S1 & S2 heard, RRR. No JVD, murmurs, rubs, gallops or clicks. No pedal edema. Gastrointestinal system: Abdomen is nondistended, soft and nontender. No organomegaly or masses felt. Normal bowel sounds heard. Central nervous system: Alert and oriented. No focal neurological deficits. Extremities: Symmetric 5 x 5 power. Skin: No rashes, lesions or ulcers Psychiatry: Judgement and insight appear normal. Mood & affect appropriate.     Data Reviewed: I have personally reviewed following labs and imaging studies  Micro Results No results found for this or any previous visit (from the past 240 hour(s)).  Radiology Reports Dg Chest 2 View  Result Date: 10/24/2018 CLINICAL DATA:  Initial evaluation for acute shortness of breath, cough. EXAM: CHEST - 2 VIEW COMPARISON:  Prior radiograph from 04/10/2018 FINDINGS: Transverse heart size within normal limits. Mediastinal silhouette normal. Lungs normally inflated. Changes related underlying COPD. Patchy and hazy densities within the right mid and lower lung, suspicious for possible infiltrate given provided history. No edema or effusion. No pneumothorax. No acute osseous finding. IMPRESSION: 1. Patchy densities within the right mid and lower lung, suspicious for possible infectious infiltrates given provided history. 2. Underlying COPD. Electronically Signed   By: Jeannine Boga M.D.   On: 10/24/2018 19:50   Dg Chest Port 1 View  Result Date: 11/06/2018 CLINICAL DATA:  Worsening fatigue. Recent pneumonia. History of COPD. EXAM: PORTABLE CHEST 1 VIEW COMPARISON:  Chest radiograph October 26, 2018 FINDINGS: Cardiomediastinal silhouette is normal. Patient rotated to the RIGHT. Calcified aortic  arch. Mild chronic interstitial changes increased lung volumes without pleural effusion or focal consolidation. No pneumothorax. Soft tissue planes and included osseous structures are non suspicious. IMPRESSION: No acute cardiopulmonary process. Emphysema (ICD10-J43.9). Aortic Atherosclerosis (ICD10-I70.0). Electronically Signed   By: Elon Alas M.D.   On: 11/06/2018 17:03   Dg Chest Port 1 View  Result Date: 10/26/2018 CLINICAL DATA:  Shortness of breath, hypoxia. EXAM: PORTABLE CHEST 1 VIEW COMPARISON:  Chest x-rays dated 10/24/2018 and 03/18/2017. FINDINGS: Heart size and mediastinal contours are stable. Lungs now appear clear. There is no confluent opacity appreciated on today's exam to suggest pneumonia. No pleural effusion or pneumothorax seen. No acute or suspicious osseous finding. IMPRESSION: No active disease. No evidence of pneumonia or pulmonary edema on today's exam. Electronically Signed   By: Franki Cabot M.D.   On: 10/26/2018 11:25     CBC Recent Labs  Lab 11/06/18 1651 11/07/18 0452  WBC 7.9 5.2  HGB 11.1* 10.3*  HCT 37.7 33.2*  PLT 159 146*  MCV 92.0 89.0  MCH 27.1 27.6  MCHC 29.4* 31.0  RDW 14.0 14.1  LYMPHSABS 1.2 0.4*  MONOABS 0.6 0.1  EOSABS 0.0 0.0  BASOSABS 0.0 0.0    Chemistries  Recent Labs  Lab 11/06/18 1651 11/07/18 0557  NA 141 139  K 4.7 5.1  CL 106 105  CO2 26 24  GLUCOSE 142* 127*  BUN 31* 46*  CREATININE 2.12*  2.34*  CALCIUM 8.5* 8.5*  AST 41  --   ALT 36  --   ALKPHOS 52  --   BILITOT 0.8  --    ------------------------------------------------------------------------------------------------------------------ estimated creatinine clearance is 21.1 mL/min (A) (by C-G formula based on SCr of 2.34 mg/dL (H)). ------------------------------------------------------------------------------------------------------------------ No results for input(s): HGBA1C in the last 72  hours. ------------------------------------------------------------------------------------------------------------------ No results for input(s): CHOL, HDL, LDLCALC, TRIG, CHOLHDL, LDLDIRECT in the last 72 hours. ------------------------------------------------------------------------------------------------------------------ No results for input(s): TSH, T4TOTAL, T3FREE, THYROIDAB in the last 72 hours.  Invalid input(s): FREET3 ------------------------------------------------------------------------------------------------------------------ No results for input(s): VITAMINB12, FOLATE, FERRITIN, TIBC, IRON, RETICCTPCT in the last 72 hours.  Coagulation profile No results for input(s): INR, PROTIME in the last 168 hours.  No results for input(s): DDIMER in the last 72 hours.  Cardiac Enzymes Recent Labs  Lab 11/06/18 1651  TROPONINI <0.03   ------------------------------------------------------------------------------------------------------------------ Invalid input(s): POCBNP   CBG: No results for input(s): GLUCAP in the last 168 hours.     Studies: Dg Chest Port 1 View  Result Date: 11/06/2018 CLINICAL DATA:  Worsening fatigue. Recent pneumonia. History of COPD. EXAM: PORTABLE CHEST 1 VIEW COMPARISON:  Chest radiograph October 26, 2018 FINDINGS: Cardiomediastinal silhouette is normal. Patient rotated to the RIGHT. Calcified aortic arch. Mild chronic interstitial changes increased lung volumes without pleural effusion or focal consolidation. No pneumothorax. Soft tissue planes and included osseous structures are non suspicious. IMPRESSION: No acute cardiopulmonary process. Emphysema (ICD10-J43.9). Aortic Atherosclerosis (ICD10-I70.0). Electronically Signed   By: Elon Alas M.D.   On: 11/06/2018 17:03      Lab Results  Component Value Date   HGBA1C 5.4 10/25/2018   HGBA1C 5.6 07/29/2018   HGBA1C 5.6 07/19/2017   Lab Results  Component Value Date   MICROALBUR <0.7  07/23/2015   CREATININE 2.34 (H) 11/07/2018       Scheduled Meds: . amLODipine  10 mg Oral Daily  . heparin  5,000 Units Subcutaneous Q8H  . hydrALAZINE  25 mg Oral Q8H  . ipratropium-albuterol  3 mL Nebulization Q4H  . labetalol  100 mg Oral BID  . methylPREDNISolone (SOLU-MEDROL) injection  60 mg Intravenous Q6H  . pantoprazole  40 mg Oral Daily  . sodium chloride flush  3 mL Intravenous Q12H   Continuous Infusions: . levofloxacin (LEVAQUIN) IV Stopped (11/07/18 0359)     LOS: 1 day    Time spent: >30 MINS    Reyne Dumas  [facility] Pager [phone]. If 7PM-7AM, please contact night-coverage at [email or web address], password [email or web address] 11/07/2018, 1:36 PM  LOS: 1 day

## 2018-11-07 NOTE — ED Notes (Signed)
While in the room to give patient solumedrol her BP cuff started to go off. She started yelling, "stop that, quit!!! Take it off!" I explained we needed to take her BP and patient yelled, "bullshit, stop it" and patient started swinging her call bell at me, trying to hit me and she started trying to kick me.

## 2018-11-07 NOTE — ED Notes (Signed)
Hospitalist Dr. [place] Crane at patient bedside.

## 2018-11-07 NOTE — ED Notes (Signed)
Patient provided with breakfast tray and updated on Plan of Care. Patient denies needs at this time.

## 2018-11-07 NOTE — ED Notes (Signed)
Patient requesting muscle relaxer for her back. Patient reports she was given a dose last night "and the doctor said they would order it for when I needed it." This RN checked the orders and updated the patient that there were no muscle relaxer or pain PRN orders, but that I would page the physician asking for an order. Patient agreed.

## 2018-11-08 ENCOUNTER — Inpatient Hospital Stay (HOSPITAL_COMMUNITY): Payer: Medicare Other

## 2018-11-08 DIAGNOSIS — E440 Moderate protein-calorie malnutrition: Secondary | ICD-10-CM

## 2018-11-08 DIAGNOSIS — J9601 Acute respiratory failure with hypoxia: Secondary | ICD-10-CM

## 2018-11-08 LAB — HEMOGLOBIN A1C
Hgb A1c MFr Bld: 6.1 % — ABNORMAL HIGH (ref 4.8–5.6)
MEAN PLASMA GLUCOSE: 128.37 mg/dL

## 2018-11-08 LAB — URINALYSIS, ROUTINE W REFLEX MICROSCOPIC
Bilirubin Urine: NEGATIVE
Glucose, UA: NEGATIVE mg/dL
Hgb urine dipstick: NEGATIVE
KETONES UR: NEGATIVE mg/dL
Leukocytes, UA: NEGATIVE
Nitrite: NEGATIVE
Protein, ur: NEGATIVE mg/dL
Specific Gravity, Urine: 1.014 (ref 1.005–1.030)
pH: 5 (ref 5.0–8.0)

## 2018-11-08 LAB — GLUCOSE, CAPILLARY
GLUCOSE-CAPILLARY: 222 mg/dL — AB (ref 70–99)
Glucose-Capillary: 171 mg/dL — ABNORMAL HIGH (ref 70–99)
Glucose-Capillary: 107 mg/dL — ABNORMAL HIGH (ref 70–99)

## 2018-11-08 MED ORDER — INSULIN GLARGINE 100 UNIT/ML ~~LOC~~ SOLN: 20.0000 [IU] | Freq: Every day | SUBCUTANEOUS | Status: DC

## 2018-11-08 MED ORDER — ENSURE ENLIVE PO LIQD
237.0000 mL | Freq: Two times a day (BID) | ORAL | Status: DC
  Administered 2018-11-08 – 2018-11-13 (×5): 237 mL via ORAL

## 2018-11-08 MED ORDER — TECHNETIUM TO 99M ALBUMIN AGGREGATED
3.9000 | Freq: Once | INTRAVENOUS | Status: AC | PRN
  Administered 2018-11-08: 3.9 via INTRAVENOUS

## 2018-11-08 MED ORDER — INSULIN GLARGINE 100 UNIT/ML ~~LOC~~ SOLN
10.0000 [IU] | Freq: Every day | SUBCUTANEOUS | Status: DC
  Administered 2018-11-08 – 2018-11-30 (×23): 10 [IU] via SUBCUTANEOUS
  Filled 2018-11-08 (×23): qty 0.1

## 2018-11-08 MED ORDER — TECHNETIUM TC 99M DIETHYLENETRIAME-PENTAACETIC ACID
29.6000 | Freq: Once | INTRAVENOUS | Status: AC | PRN
  Administered 2018-11-08: 29.6 via RESPIRATORY_TRACT

## 2018-11-08 MED ORDER — INSULIN ASPART 100 UNIT/ML ~~LOC~~ SOLN
0.0000 [IU] | Freq: Three times a day (TID) | SUBCUTANEOUS | Status: DC
  Administered 2018-11-08: 2 [IU] via SUBCUTANEOUS
  Administered 2018-11-08: 3 [IU] via SUBCUTANEOUS
  Administered 2018-11-09 (×3): 1 [IU] via SUBCUTANEOUS
  Administered 2018-11-10: 2 [IU] via SUBCUTANEOUS
  Administered 2018-11-10: 3 [IU] via SUBCUTANEOUS
  Administered 2018-11-10: 1 [IU] via SUBCUTANEOUS
  Administered 2018-11-11: 2 [IU] via SUBCUTANEOUS
  Administered 2018-11-11: 3 [IU] via SUBCUTANEOUS
  Administered 2018-11-11: 7 [IU] via SUBCUTANEOUS
  Administered 2018-11-12: 3 [IU] via SUBCUTANEOUS
  Administered 2018-11-12: 7 [IU] via SUBCUTANEOUS
  Administered 2018-11-12 – 2018-11-13 (×2): 3 [IU] via SUBCUTANEOUS
  Administered 2018-11-13: 2 [IU] via SUBCUTANEOUS
  Administered 2018-11-13: 3 [IU] via SUBCUTANEOUS
  Administered 2018-11-14 (×3): 2 [IU] via SUBCUTANEOUS
  Administered 2018-11-15: 1 [IU] via SUBCUTANEOUS
  Administered 2018-11-15: 5 [IU] via SUBCUTANEOUS
  Administered 2018-11-15: 3 [IU] via SUBCUTANEOUS
  Administered 2018-11-16: 1 [IU] via SUBCUTANEOUS
  Administered 2018-11-16: 2 [IU] via SUBCUTANEOUS
  Administered 2018-11-16 – 2018-11-17 (×3): 1 [IU] via SUBCUTANEOUS
  Administered 2018-11-17: 3 [IU] via SUBCUTANEOUS

## 2018-11-08 MED ORDER — MORPHINE SULFATE (PF) 2 MG/ML IV SOLN
2.0000 mg | Freq: Once | INTRAVENOUS | Status: AC
  Administered 2018-11-08: 2 mg via INTRAVENOUS
  Filled 2018-11-08: qty 1

## 2018-11-08 MED ORDER — HYDROMORPHONE HCL 1 MG/ML IJ SOLN
1.0000 mg | Freq: Once | INTRAMUSCULAR | Status: AC
  Administered 2018-11-08: 1 mg via INTRAVENOUS
  Filled 2018-11-08: qty 1

## 2018-11-08 MED ORDER — OXYCODONE HCL 5 MG PO TABS
5.0000 mg | ORAL_TABLET | Freq: Four times a day (QID) | ORAL | Status: DC | PRN
  Administered 2018-11-08 – 2018-11-16 (×6): 5 mg via ORAL
  Filled 2018-11-08 (×7): qty 1

## 2018-11-08 MED ORDER — LEVOFLOXACIN 750 MG PO TABS
750.0000 mg | ORAL_TABLET | ORAL | Status: DC
  Administered 2018-11-08: 750 mg via ORAL
  Filled 2018-11-08: qty 1

## 2018-11-08 NOTE — Progress Notes (Signed)
Care resumed from previous RN, agree with current assessment. Patient currently down in Radiology, went to give IV dilaudid per order x1 for back pain.

## 2018-11-08 NOTE — Care Management Important Message (Signed)
Important Message  Patient Details  Name: ENJOLI TIDD MRN: [id number] Date of Birth: 1946/01/06   Medicare Important Message Given:  Yes    Kerin Salen 11/08/2018, 11:14 AMImportant Message  Patient Details  Name: AFSHEEN ANTONY MRN: [id number] Date of Birth: 1945/12/19   Medicare Important Message Given:  Yes    Kerin Salen 11/08/2018, 11:14 AM

## 2018-11-08 NOTE — Care Management Note (Signed)
Case Management Note  Patient Details  Name: Victoria Bird MRN: [id number] Date of Birth: 08-19-1946  Subjective/Objective:                     Action/Plan:Pt declined HH. Discharge readiness is indicated by patient meeting Recovery Milestones, including ALL of the following: ? Hemodynamic stability ? Mental status at baseline ? No evidence of infection, or outpatient treatment planned ? Eating and sleeping without frequent dyspnea ? Breathing comfortably at rest ? Ambulatory ? Oral hydration, medications, and diet ? Discharge plans and education    Expected Discharge Date:  (unknown)               Expected Discharge Plan:  [place]  In-House Referral:     Discharge planning Services  CM Consult  Post Acute Care Choice:    Choice offered to:  Patient  DME Arranged:    DME Agency:     HH Arranged:  (Patient Refused [facility]) [place] Agency:     Status of Service:     If discussed at [place] of Stay Meetings, dates discussed:    Additional CommentsPurcell Mouton, RN 11/08/2018, 11:25 AM

## 2018-11-08 NOTE — Progress Notes (Signed)
OT Cancellation Note  Patient Details Name: Victoria Bird MRN: [id number] DOB: 1946/01/11   Cancelled Treatment:    Reason Eval/Treat Not Completed: Medical issues which prohibited therapy. Awaiting VQ scan results.  Will likely check back over the weekend  [facility] 11/08/2018, 12:35 PM  Lesle Chris, OTR/L Acute Rehabilitation Services [phone] [facility] pager [phone] office 11/08/2018

## 2018-11-08 NOTE — Progress Notes (Signed)
PT Cancellation Note  Patient Details Name: Victoria Bird MRN: [id number] DOB: Jul 26, 1946   Cancelled Treatment:    Reason Eval/Treat Not Completed: Medical issues which prohibited therapy Pt remains SOB at rest.  V/Q scan ordered, will await results.   [name],[name] 11/08/2018, 11:40 AM [name], PT, DPT Acute Rehabilitation Services Office: [phone] Pager: [phone]

## 2018-11-08 NOTE — Progress Notes (Signed)
PHARMACIST - PHYSICIAN COMMUNICATION DR:   Allyson Sabal CONCERNING: Antibiotic IV to Oral Route Change Policy  RECOMMENDATION: This patient is receiving Levaquin by the intravenous route.  Based on criteria approved by the Pharmacy and Therapeutics Committee, the antibiotic(s) is/are being converted to the equivalent oral dose form(s).   DESCRIPTION: These criteria include:  Patient being treated for a respiratory tract infection, urinary tract infection, cellulitis or clostridium difficile associated diarrhea if on metronidazole  The patient is not neutropenic and does not exhibit a GI malabsorption state  The patient is eating (either orally or via tube) and/or has been taking other orally administered medications for a least 24 hours  The patient is improving clinically and has a Tmax < 100.5  If you have questions about this conversion, please contact the Pharmacy Department  []   [phone] )  [facility] []   [phone] )  [name] []   [phone] )  [facility] []   [phone] )  [facility] [x]   [phone] )  [place], PharmD, BCPS 11/08/2018 8:42 AM

## 2018-11-08 NOTE — Progress Notes (Signed)
Patient with no urine output in several hours, bladder scan volume 580. Paged Dr. Allyson Sabal for orders to I/O Cath patient. I/O Cath for 1054mL, UA sent per orders.

## 2018-11-08 NOTE — Progress Notes (Signed)
Initial Nutrition Assessment  DOCUMENTATION CODES:   Non-severe (moderate) malnutrition in context of chronic illness  INTERVENTION:    Ensure Enlive po BID, each supplement provides 350 kcal and 20 grams of protein  MVI daily  NUTRITION DIAGNOSIS:   Moderate Malnutrition related to chronic illness(COPD) as evidenced by percent weight loss, energy intake < 75% for > or equal to 1 month, moderate muscle depletion, severe muscle depletion.  GOAL:   Patient will meet greater than or equal to 90% of their needs  MONITOR:   PO intake, Supplement acceptance, Weight trends, Labs  REASON FOR ASSESSMENT:   Consult COPD Protocol  ASSESSMENT:   Patient with PMH significant for COPD, CKD III, and HTN. Recently admitted January 23-January 27 where she was treated for COPD exacerbation in the setting of PNA. Presents this admission with acute exacerbation of COPD.    Pt endorses having a loss in appetite over the last 5-6 months due to ongoing breathing difficulties. States a typical day consist of: B- cheerios with milk; L- deli meat sandwich; D- cereal with milk or meat with vegetable. She does not use supplementation at home. Pt describes having issues with swallowing. States the muscles in her neck become weak from increased work of breathing and it feels hard to swallow food. Pt may benefit from SLP consult. Discussed the importance of protein intake for preservation of lean body mass. Pt amendable to Ensure.   Pt reports a UBW of 165 lb and is unsure if she recently lost weight. Records indicate pt weighed 169 lb on 1/25 and 161 lb this admission (4.7% wt loss in two weeks, significant for time frame). Nutrition-Focused physical exam completed.  Medications reviewed and include: SS novolog, lantus, solumedrol Labs reviewed.   NUTRITION - FOCUSED PHYSICAL EXAM:    Most Recent Value  Orbital Region  No depletion  Upper Arm Region  Mild depletion  Thoracic and Lumbar Region  Unable  to assess  Buccal Region  No depletion  Temple Region  No depletion  Clavicle Bone Region  Mild depletion  Clavicle and Acromion Bone Region  Mild depletion  Scapular Bone Region  Unable to assess  Dorsal Hand  Mild depletion  Patellar Region  Moderate depletion  Anterior Thigh Region  Severe depletion  Posterior Calf Region  Severe depletion  Edema (RD Assessment)  None     Diet Order:   Diet Order            Diet regular Room service appropriate? Yes; Fluid consistency: Thin  Diet effective now              EDUCATION NEEDS:   Education needs have been addressed  Skin:  Skin Assessment: Skin Integrity Issues: Skin Integrity Issues:: Other (Comment) Other: MASD- bilateral perineum  Last BM:  2/4  Height:   Ht Readings from Last 1 Encounters:  11/07/18 5\' 3"  (1.6 m)    Weight:   Wt Readings from Last 1 Encounters:  11/07/18 73.2 kg    Ideal Body Weight:  52.3 kg  BMI:  Body mass index is 28.59 kg/m.  Estimated Nutritional Needs:   Kcal:  1650-1850 kcal  Protein:  80-95 grams  Fluid:  >/= 1.6 L/day   Mariana Single RD, LDN Clinical Nutrition Pager # - [phone]

## 2018-11-08 NOTE — Progress Notes (Signed)
Patient's daughter notified this RN that she is very concerned with patient's ability to care for herself, she states "I had to ambush her with the EMS to get her to come to the ER, she didn't have her oxygen on, and was lying in pee" CSW consult ordered.

## 2018-11-08 NOTE — Progress Notes (Addendum)
[facility] PROGRESS NOTE  Victoria Bird JZP:[id number] DOB: Feb 02, 1946 DOA: 11/06/2018   PCP: Hoyt Koch, MD     Assessment/Plan: Active Problems:   COPD exacerbation [facility])   Acute exacerbation of chronic obstructive pulmonary disease (COPD) ([place])   73 year old woman with medical problems including COPD (very severe obstruction on spirometry in 04/2018), chronic kidney disease stage III vs IV, former smoker, and chronic hypoxic respiratory failure with a baseline 2 L oxygen requirement, and hypertension presenting with shortness of breath.admitted by hospitalist for COPD exacerbation,received IV Solu-Medrol in route, in addition oxygen saturations were found to be in the 70s by EMS.  Upon arrival to the emergency department, vital signs remarkable for respiratory rate of 42, heart rate of 96, systolic blood pressure ranging from 118-136.  chest x-ray negative on admission, initially placed on BiPAP for respiratory support, stepdown,off BiPAP and transferred to telemetry  Assessment and plan #Acue on chronic hypoxic respiratory failure secondary to COPD exacerbation, suspected  Was admitted at end of 10/2018 for COPD exacerbation in setting of pneumonia; she reports never reaching her respiratory baseline before decompensation requiring EMS dispatch.  Symptoms of dyspnea at rest and subjective fever, found to have O2 sat in 70s upon EMS arrival and significant tachypnea with RR in 30-40s.; home O2 requirement of 2 L O2 although no productive cough or increased purulence, no other clearly identifiable cause evident for trigger of this episode; given presence of diffuse wheezing - suspect COPD exacerbation. Initially  placed on BIPAP for respiratory support in the step-down setting with antimicrobial coverage (fluroquinolone given allergies to other antimicrobials), scheduled nebulized bronchodilators, and continuation of systemic steroids with IV methylprednisolone . A  pulmonary embolus was considered; however, in the setting of diffuse wheezing and COPD exacerbation as likely driver to symptoms, this is less likely; however, given persistent symptoms VQ was ordered and found to be low probability for pulmonary embolism .   TTE  Showed EF of greater than 65. Unable to estimate PA systolic pressures.right ventricle with normal systolic function.  She does have evidence of cor pulmonale. Respiratory panel positive for metapneumovirus. She still hypoxic on 4 L of oxygen. Declined physical therapy yesterday because of fatigue    -HTN: will continue home amlodipine, hydralazine, and labetalol; hold home furosemide  -Former smoker: congratulated, stopped since ~11/2017 per her report  Acute on CKD stage III: baseline CR ~2,cr trending upwards,, but her baseline is still around 2-2.5. Continue to monitor renal function closely  - hyperglycemia likely in the setting of steroids, patient is not a diabetic. Patient has been started on low-dose Lantus and sliding scale insulin     DVT prophylaxsis heparin  Code Status:  Full code   Family Communication: Discussed in detail with the patient, all imaging results, lab results explained to the patient   Disposition Plan:   Anticipate discharge in the next 24-48 hours of symptomatic improvement     Consultants:  None   Procedures:  None   Antibiotics: Anti-infectives (From admission, onward)   Start     Dose/Rate Route Frequency Ordered Stop   11/08/18 1800  levofloxacin (LEVAQUIN) tablet 750 mg     750 mg Oral Every 48 hours 11/08/18 0841 11/12/18 1759   11/06/18 1930  levofloxacin (LEVAQUIN) IVPB 750 mg  Status:  Discontinued     750 mg 100 mL/hr over 90 Minutes Intravenous Every 48 hours 11/06/18 1901 11/08/18 0841   11/06/18 1900  levofloxacin (LEVAQUIN) IVPB  750 mg  Status:  Discontinued     750 mg 100 mL/hr over 90 Minutes Intravenous Every 24 hours 11/06/18 1852 11/06/18 1900          HPI/Subjective:  patient short of breath with minimal activity, she is unable to ambulate without feeling short of breath. She refused physical therapy yesterday  Objective: Vitals:   11/07/18 2307 11/08/18 0042 11/08/18 0458 11/08/18 0805  BP:  (!) 113/56 134/66   Pulse:  88 70   Resp:   18   Temp:  (!) 97.5 F (36.4 C) 97.8 F (36.6 C)   TempSrc:  Axillary    SpO2: 94% 91% 92% 92%  Weight:      Height:        Intake/Output Summary (Last 24 hours) at 11/08/2018 1021 Last data filed at 11/08/2018 0503 Gross per 24 hour  Intake 390 ml  Output 200 ml  Net 190 ml    Exam:  Examination:  General exam: Appears calm and comfortable  Respiratory system: wheezing  On auscultation.increased  Respiratory effort normal. Cardiovascular system: S1 & S2 heard, RRR. No JVD, murmurs, rubs, gallops or clicks. No pedal edema. Gastrointestinal system: Abdomen is nondistended, soft and nontender. No organomegaly or masses felt. Normal bowel sounds heard. Central nervous system: Alert and oriented. No focal neurological deficits. Extremities: Symmetric 5 x 5 power.      Data Reviewed: I have personally reviewed following labs and imaging studies  Micro Results Recent Results (from the past 240 hour(s))  Respiratory Panel by PCR     Status: Abnormal   Collection Time: 11/07/18  2:40 PM  Result Value Ref Range Status   Adenovirus NOT DETECTED NOT DETECTED Final   Coronavirus 229E NOT DETECTED NOT DETECTED Final    Comment: (NOTE) The Coronavirus on the Respiratory Panel, DOES NOT test for the novel  Coronavirus (2019 nCoV)    Coronavirus HKU1 NOT DETECTED NOT DETECTED Final   Coronavirus NL63 NOT DETECTED NOT DETECTED Final   Coronavirus OC43 NOT DETECTED NOT DETECTED Final   Metapneumovirus DETECTED (A) NOT DETECTED Final   Rhinovirus / Enterovirus NOT DETECTED NOT DETECTED Final   Influenza A NOT DETECTED NOT DETECTED Final   Influenza B NOT DETECTED NOT DETECTED Final    Parainfluenza Virus 1 NOT DETECTED NOT DETECTED Final   Parainfluenza Virus 2 NOT DETECTED NOT DETECTED Final   Parainfluenza Virus 3 NOT DETECTED NOT DETECTED Final   Parainfluenza Virus 4 NOT DETECTED NOT DETECTED Final   Respiratory Syncytial Virus NOT DETECTED NOT DETECTED Final   Bordetella pertussis NOT DETECTED NOT DETECTED Final   Chlamydophila pneumoniae NOT DETECTED NOT DETECTED Final   Mycoplasma pneumoniae NOT DETECTED NOT DETECTED Final    Comment: Performed at [facility], [address], [place], [place] [address]    Radiology Reports Dg Chest 2 View  Result Date: 10/24/2018 CLINICAL DATA:  Initial evaluation for acute shortness of breath, cough. EXAM: CHEST - 2 VIEW COMPARISON:  Prior radiograph from 04/10/2018 FINDINGS: Transverse heart size within normal limits. Mediastinal silhouette normal. Lungs normally inflated. Changes related underlying COPD. Patchy and hazy densities within the right mid and lower lung, suspicious for possible infiltrate given provided history. No edema or effusion. No pneumothorax. No acute osseous finding. IMPRESSION: 1. Patchy densities within the right mid and lower lung, suspicious for possible infectious infiltrates given provided history. 2. Underlying COPD. Electronically Signed   By: Jeannine Boga M.D.   On: 10/24/2018 19:50  Dg Chest Port 1 View  Result Date: 11/06/2018 CLINICAL DATA:  Worsening fatigue. Recent pneumonia. History of COPD. EXAM: PORTABLE CHEST 1 VIEW COMPARISON:  Chest radiograph October 26, 2018 FINDINGS: Cardiomediastinal silhouette is normal. Patient rotated to the RIGHT. Calcified aortic arch. Mild chronic interstitial changes increased lung volumes without pleural effusion or focal consolidation. No pneumothorax. Soft tissue planes and included osseous structures are non suspicious. IMPRESSION: No acute cardiopulmonary process. Emphysema (ICD10-J43.9). Aortic Atherosclerosis (ICD10-I70.0). Electronically  Signed   By: Elon Alas M.D.   On: 11/06/2018 17:03   Dg Chest Port 1 View  Result Date: 10/26/2018 CLINICAL DATA:  Shortness of breath, hypoxia. EXAM: PORTABLE CHEST 1 VIEW COMPARISON:  Chest x-rays dated 10/24/2018 and 03/18/2017. FINDINGS: Heart size and mediastinal contours are stable. Lungs now appear clear. There is no confluent opacity appreciated on today's exam to suggest pneumonia. No pleural effusion or pneumothorax seen. No acute or suspicious osseous finding. IMPRESSION: No active disease. No evidence of pneumonia or pulmonary edema on today's exam. Electronically Signed   By: Franki Cabot M.D.   On: 10/26/2018 11:25     CBC Recent Labs  Lab 11/06/18 1651 11/07/18 0452  WBC 7.9 5.2  HGB 11.1* 10.3*  HCT 37.7 33.2*  PLT 159 146*  MCV 92.0 89.0  MCH 27.1 27.6  MCHC 29.4* 31.0  RDW 14.0 14.1  LYMPHSABS 1.2 0.4*  MONOABS 0.6 0.1  EOSABS 0.0 0.0  BASOSABS 0.0 0.0    Chemistries  Recent Labs  Lab 11/06/18 1651 11/07/18 0557  NA 141 139  K 4.7 5.1  CL 106 105  CO2 26 24  GLUCOSE 142* 127*  BUN 31* 46*  CREATININE 2.12* 2.34*  CALCIUM 8.5* 8.5*  AST 41  --   ALT 36  --   ALKPHOS 52  --   BILITOT 0.8  --    ------------------------------------------------------------------------------------------------------------------ estimated creatinine clearance is 21.1 mL/min (A) (by C-G formula based on SCr of 2.34 mg/dL (H)). ------------------------------------------------------------------------------------------------------------------ No results for input(s): HGBA1C in the last 72 hours. ------------------------------------------------------------------------------------------------------------------ No results for input(s): CHOL, HDL, LDLCALC, TRIG, CHOLHDL, LDLDIRECT in the last 72 hours. ------------------------------------------------------------------------------------------------------------------ No results for input(s): TSH, T4TOTAL, T3FREE, THYROIDAB  in the last 72 hours.  Invalid input(s): FREET3 ------------------------------------------------------------------------------------------------------------------ No results for input(s): VITAMINB12, FOLATE, FERRITIN, TIBC, IRON, RETICCTPCT in the last 72 hours.  Coagulation profile No results for input(s): INR, PROTIME in the last 168 hours.  No results for input(s): DDIMER in the last 72 hours.  Cardiac Enzymes Recent Labs  Lab 11/06/18 1651  TROPONINI <0.03   ------------------------------------------------------------------------------------------------------------------ Invalid input(s): POCBNP   CBG: No results for input(s): GLUCAP in the last 168 hours.     Studies: Dg Chest Port 1 View  Result Date: 11/06/2018 CLINICAL DATA:  Worsening fatigue. Recent pneumonia. History of COPD. EXAM: PORTABLE CHEST 1 VIEW COMPARISON:  Chest radiograph October 26, 2018 FINDINGS: Cardiomediastinal silhouette is normal. Patient rotated to the RIGHT. Calcified aortic arch. Mild chronic interstitial changes increased lung volumes without pleural effusion or focal consolidation. No pneumothorax. Soft tissue planes and included osseous structures are non suspicious. IMPRESSION: No acute cardiopulmonary process. Emphysema (ICD10-J43.9). Aortic Atherosclerosis (ICD10-I70.0). Electronically Signed   By: Elon Alas M.D.   On: 11/06/2018 17:03      Lab Results  Component Value Date   HGBA1C 5.4 10/25/2018   HGBA1C 5.6 07/29/2018   HGBA1C 5.6 07/19/2017   Lab Results  Component Value Date   MICROALBUR <0.7 07/23/2015   CREATININE 2.34 (H) 11/07/2018  Scheduled Meds: . amLODipine  10 mg Oral Daily  . heparin  5,000 Units Subcutaneous Q8H  . hydrALAZINE  25 mg Oral Q8H  . ipratropium-albuterol  3 mL Nebulization Q4H  . labetalol  100 mg Oral BID  . levofloxacin  750 mg Oral Q48H  . methylPREDNISolone (SOLU-MEDROL) injection  60 mg Intravenous Q6H  . pantoprazole  40 mg  Oral Daily  . sodium chloride flush  3 mL Intravenous Q12H   Continuous Infusions:    LOS: 2 days    Time spent: >30 MINS    Reyne Dumas  [facility] Pager [phone]. If 7PM-7AM, please contact night-coverage at [email or web address], password [facility] 11/08/2018, 10:21 AM  LOS: 2 days

## 2018-11-09 ENCOUNTER — Inpatient Hospital Stay (HOSPITAL_COMMUNITY): Payer: Medicare Other

## 2018-11-09 DIAGNOSIS — I10 Essential (primary) hypertension: Secondary | ICD-10-CM

## 2018-11-09 DIAGNOSIS — N179 Acute kidney failure, unspecified: Secondary | ICD-10-CM

## 2018-11-09 DIAGNOSIS — J9621 Acute and chronic respiratory failure with hypoxia: Secondary | ICD-10-CM

## 2018-11-09 DIAGNOSIS — R0902 Hypoxemia: Secondary | ICD-10-CM

## 2018-11-09 DIAGNOSIS — E1121 Type 2 diabetes mellitus with diabetic nephropathy: Secondary | ICD-10-CM

## 2018-11-09 DIAGNOSIS — J9622 Acute and chronic respiratory failure with hypercapnia: Secondary | ICD-10-CM

## 2018-11-09 DIAGNOSIS — Z720 Tobacco use: Secondary | ICD-10-CM

## 2018-11-09 DIAGNOSIS — J441 Chronic obstructive pulmonary disease with (acute) exacerbation: Secondary | ICD-10-CM

## 2018-11-09 DIAGNOSIS — K219 Gastro-esophageal reflux disease without esophagitis: Secondary | ICD-10-CM

## 2018-11-09 LAB — CBC WITH DIFFERENTIAL/PLATELET
Abs Immature Granulocytes: 0.06 10*3/uL (ref 0.00–0.07)
BASOS ABS: 0 10*3/uL (ref 0.0–0.1)
Basophils Relative: 0 %
Eosinophils Absolute: 0 10*3/uL (ref 0.0–0.5)
Eosinophils Relative: 0 %
HCT: 34.9 % — ABNORMAL LOW (ref 36.0–46.0)
Hemoglobin: 10.5 g/dL — ABNORMAL LOW (ref 12.0–15.0)
Immature Granulocytes: 1 %
Lymphocytes Relative: 9 %
Lymphs Abs: 0.7 10*3/uL (ref 0.7–4.0)
MCH: 26.6 pg (ref 26.0–34.0)
MCHC: 30.1 g/dL (ref 30.0–36.0)
MCV: 88.6 fL (ref 80.0–100.0)
Monocytes Absolute: 0.2 10*3/uL (ref 0.1–1.0)
Monocytes Relative: 3 %
NEUTROS PCT: 87 %
Neutro Abs: 6.8 10*3/uL (ref 1.7–7.7)
Platelets: 221 10*3/uL (ref 150–400)
RBC: 3.94 MIL/uL (ref 3.87–5.11)
RDW: 13.7 % (ref 11.5–15.5)
WBC: 7.7 10*3/uL (ref 4.0–10.5)
nRBC: 0 % (ref 0.0–0.2)

## 2018-11-09 LAB — GLUCOSE, CAPILLARY
Glucose-Capillary: 145 mg/dL — ABNORMAL HIGH (ref 70–99)
Glucose-Capillary: 143 mg/dL — ABNORMAL HIGH (ref 70–99)
Glucose-Capillary: 137 mg/dL — ABNORMAL HIGH (ref 70–99)
Glucose-Capillary: 153 mg/dL — ABNORMAL HIGH (ref 70–99)

## 2018-11-09 LAB — BASIC METABOLIC PANEL
Anion gap: 9 (ref 5–15)
BUN: 85 mg/dL — ABNORMAL HIGH (ref 8–23)
CO2: 25 mmol/L (ref 22–32)
CREATININE: 3.15 mg/dL — AB (ref 0.44–1.00)
Calcium: 8.7 mg/dL — ABNORMAL LOW (ref 8.9–10.3)
Chloride: 104 mmol/L (ref 98–111)
GFR calc Af Amer: 16 mL/min — ABNORMAL LOW (ref >60)
GFR, EST NON AFRICAN AMERICAN: 14 mL/min — AB (ref >60)
Glucose, Bld: 181 mg/dL — ABNORMAL HIGH (ref 70–99)
Potassium: 4.9 mmol/L (ref 3.5–5.1)
Sodium: 138 mmol/L (ref 135–145)

## 2018-11-09 LAB — CREATININE, URINE, RANDOM: Creatinine, Urine: 124.04 mg/dL

## 2018-11-09 LAB — SODIUM, URINE, RANDOM: Sodium, Ur: 15 mmol/L

## 2018-11-09 LAB — MRSA PCR SCREENING: MRSA by PCR: NEGATIVE

## 2018-11-09 LAB — MAGNESIUM: Magnesium: 2.7 mg/dL — ABNORMAL HIGH (ref 1.7–2.4)

## 2018-11-09 MED ORDER — ARFORMOTEROL TARTRATE 15 MCG/2ML IN NEBU
15.0000 ug | INHALATION_SOLUTION | Freq: Two times a day (BID) | RESPIRATORY_TRACT | Status: DC
  Administered 2018-11-09 – 2018-11-11 (×5): 15 ug via RESPIRATORY_TRACT
  Filled 2018-11-09 (×5): qty 2

## 2018-11-09 MED ORDER — FLUTICASONE PROPIONATE 50 MCG/ACT NA SUSP
2.0000 | Freq: Every day | NASAL | Status: DC
  Administered 2018-11-10 – 2018-11-27 (×9): 2 via NASAL
  Filled 2018-11-09: qty 16

## 2018-11-09 MED ORDER — ALBUTEROL SULFATE (2.5 MG/3ML) 0.083% IN NEBU
2.5000 mg | INHALATION_SOLUTION | RESPIRATORY_TRACT | Status: DC | PRN
  Administered 2018-11-12: 2.5 mg via RESPIRATORY_TRACT
  Filled 2018-11-09: qty 3

## 2018-11-09 MED ORDER — HYDRALAZINE HCL 20 MG/ML IJ SOLN
5.0000 mg | Freq: Four times a day (QID) | INTRAMUSCULAR | Status: DC | PRN
  Administered 2018-11-09 – 2018-11-15 (×6): 5 mg via INTRAVENOUS
  Filled 2018-11-09 (×7): qty 1

## 2018-11-09 MED ORDER — SODIUM CHLORIDE 0.9 % IV BOLUS
500.0000 mL | Freq: Once | INTRAVENOUS | Status: AC
  Administered 2018-11-09: 500 mL via INTRAVENOUS

## 2018-11-09 MED ORDER — LORATADINE 10 MG PO TABS
10.0000 mg | ORAL_TABLET | Freq: Every day | ORAL | Status: DC
  Administered 2018-11-09 – 2018-11-17 (×9): 10 mg via ORAL
  Filled 2018-11-09 (×9): qty 1

## 2018-11-09 MED ORDER — IPRATROPIUM-ALBUTEROL 0.5-2.5 (3) MG/3ML IN SOLN
3.0000 mL | Freq: Four times a day (QID) | RESPIRATORY_TRACT | Status: DC
  Administered 2018-11-09 – 2018-11-13 (×16): 3 mL via RESPIRATORY_TRACT
  Filled 2018-11-09 (×16): qty 3

## 2018-11-09 MED ORDER — LEVOFLOXACIN 500 MG PO TABS
500.0000 mg | ORAL_TABLET | ORAL | Status: AC
  Administered 2018-11-10: 500 mg via ORAL
  Filled 2018-11-09 (×2): qty 1

## 2018-11-09 MED ORDER — METHYLPREDNISOLONE SODIUM SUCC 125 MG IJ SOLR
125.0000 mg | Freq: Three times a day (TID) | INTRAMUSCULAR | Status: DC
  Administered 2018-11-09 – 2018-11-12 (×9): 125 mg via INTRAVENOUS
  Filled 2018-11-09 (×9): qty 2

## 2018-11-09 NOTE — Consult Note (Signed)
NAME:  Victoria Bird, MRN:  [id number], DOB:  [birth year], LOS: 3 ADMISSION DATE:  11/06/2018, CONSULTATION DATE:  11/09/2018 REFERRING MD:  Nile RiggsGrandville Silos, CHIEF COMPLAINT:  Acute on chronic respiratory failure   Brief History   73 year old female with ES-COPD who presents in respiratory failure acute on chronic due to metapneumo virus infection.  PCCM was consulted for increased WOB and evolving respiratory failure.  Patient is not speaking in full sentences so difficult to get history but is full code per her request.   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  PCCM was consulted for increased WOB and evolving respiratory failure.  Patient is not speaking in full sentences so difficult to get history but is full code per her request.   Past Medical History  COPD  Significant Hospital Events   2/8 acute respiratory failure  Consults:  PCCM  Procedures:  None  Significant Diagnostic Tests:  CXR with hyperinflation RVP with metapneumo virus positive  Micro Data:  RVP metapneumovirus  Antimicrobials:  Levaquin 2/7>>>   Interim history/subjective:  C/O SOB and not wanting the BiPAP back on  Objective   Blood pressure (!) 150/66, pulse 69, temperature (!) 97.5 F (36.4 C), temperature source Oral, resp. rate 14, height '5\' 3"'  (1.6 m), weight 73.2 kg, SpO2 91 %.        Intake/Output Summary (Last 24 hours) at 11/09/2018 1243 Last data filed at 11/09/2018 0600 Gross per 24 hour  Intake 892.41 ml  Output 1000 ml  Net -107.59 ml   Filed Weights   11/07/18 1242  Weight: 73.2 kg    Examination: General: Acute on chronically ill appearing female, visible respiratory distress HENT: [place]/AT, PERRL, EOM-I and MMM Lungs: Barely audible BS diffusely  Cardiovascular: RRR, Nl S1/S2 and -M/R/G Abdomen: Soft, NT, ND and +BS Extremities: -edema and -tenderness Neuro: Alert and  interactive, moving all ext to command Skin: thin but intact  I reviewed CXR myself, hyperinflation noted with no acute findings  Resolved Hospital Problem list   N/A  Assessment & Plan:  73 year old female with severe COPD who presents to PCCM with acute on chronic respiratory failure due to COPD and metapneumovirus.  Discussed with [facility]-MD.  Acute on chronic respiratory failure:  - Transfer to the SDU  - BiPAP  - If deteriorates then will intubate  COPD:  - Brovana  - Duonebs  - PRN albuterol  - Solumedrol  - Levofloxacin for airway sterilization  Hypoxemia:  - Titrate O2 for sat of 88-92% not higher due to severe COPD  - May need an ambulatory desaturation study when improved for home O2 level  Metapneumovirus infection:   - Supportive care    Labs   CBC: Recent Labs  Lab 11/06/18 1651 11/07/18 0452 11/09/18 0830  WBC 7.9 5.2 7.7  NEUTROABS 6.1 4.7 6.8  HGB 11.1* 10.3* 10.5*  HCT 37.7 33.2* 34.9*  MCV 92.0 89.0 88.6  PLT 159 146* 569    Basic Metabolic Panel: Recent Labs  Lab 11/06/18 1651 11/07/18 0557 11/09/18 0830  NA 141 139 138  K 4.7 5.1 4.9  CL 106 105 104  CO2 '26 24 25  ' GLUCOSE 142* 127* 181*  BUN 31* 46* 85*  CREATININE 2.12* 2.34* 3.15*  CALCIUM 8.5* 8.5* 8.7*  MG  --   --  2.7*   GFR: Estimated Creatinine Clearance: 15.7  mL/min (A) (by C-G formula based on SCr of 3.15 mg/dL (H)). Recent Labs  Lab 11/06/18 1651 11/07/18 0452 11/09/18 0830  WBC 7.9 5.2 7.7    Liver Function Tests: Recent Labs  Lab 11/06/18 1651  AST 41  ALT 36  ALKPHOS 52  BILITOT 0.8  PROT 6.8  ALBUMIN 3.5   No results for input(s): LIPASE, AMYLASE in the last 168 hours. No results for input(s): AMMONIA in the last 168 hours.  ABG    Component Value Date/Time   PHART 7.316 (L) 11/06/2018 1655   PCO2ART 55.2 (H) 11/06/2018 1655   PO2ART 38.5 (LL) 11/06/2018 1655   HCO3 27.4 11/06/2018 1655   TCO2 33 03/10/2016 0103   ACIDBASEDEF 0.9 11/10/2017  0540   O2SAT 68.4 11/06/2018 1655     Coagulation Profile: No results for input(s): INR, PROTIME in the last 168 hours.  Cardiac Enzymes: Recent Labs  Lab 11/06/18 1651  TROPONINI <0.03    HbA1C: Hgb A1c MFr Bld  Date/Time Value Ref Range Status  11/08/2018 11:01 AM 6.1 (H) 4.8 - 5.6 % Final    Comment:    (NOTE) Pre diabetes:          5.7%-6.4% Diabetes:              >6.4% Glycemic control for   <7.0% adults with diabetes   10/25/2018 12:21 AM 5.4 4.8 - 5.6 % Final    Comment:    (NOTE) Pre diabetes:          5.7%-6.4% Diabetes:              >6.4% Glycemic control for   <7.0% adults with diabetes     CBG: Recent Labs  Lab 11/08/18 1149 11/08/18 1739 11/08/18 2130 11/09/18 0740 11/09/18 1156  GLUCAP 171* 222* 107* 145* 143*    Review of Systems:   Unattainable, patient is very SOB  Past Medical History  She,  has a past medical history of Anxiety, Chronic pain, Colon polyps, COPD (chronic obstructive pulmonary disease) ([place]), Diabetes mellitus, Diverticulitis, Diverticulosis, Emphysema lung ([place]), Gall stones, GERD (gastroesophageal reflux disease), H/O: pneumonia, High cholesterol, Hypertension, OSA (obstructive sleep apnea), and Tracheobronchitis (01/01/2012).   Surgical History    Past Surgical History:  Procedure Laterality Date  . ABDOMINAL HYSTERECTOMY    . APPENDECTOMY    . Bilateral shoulder surgery    . CARPAL TUNNEL RELEASE Left   . CATARACT EXTRACTION    . CHOLECYSTECTOMY    . COLONOSCOPY    . ESOPHAGOGASTRODUODENOSCOPY    . FRACTURE SURGERY    . Right knee surgery    . ROTATOR CUFF REPAIR       Social History   reports that she quit smoking about a year ago. Her smoking use included cigarettes. She started smoking about 44 years ago. She has a 61.50 pack-year smoking history. She has never used smokeless tobacco. She reports that she does not drink alcohol or use drugs.   Family History   Her family history includes Alcohol abuse in  her father; Diabetes in her maternal grandfather; Heart attack in her maternal grandfather, maternal grandmother, and mother; Hypertension in her maternal grandfather and maternal grandmother; Stroke in her maternal grandfather. There is no history of Thyroid disease, Lung disease, Colon cancer, or Breast cancer.   Allergies Allergies  Allergen Reactions  . Aspartame And Phenylalanine Nausea And Vomiting    Patient says she is allergic to all artificial sweeteners  . Doxycycline Other (See Comments)  Nausea, vomiting, HA, double vision  . Tramadol Shortness Of Breath and Nausea Only    Can tolerate Morphine/Dilaudid IV, Percocet per pt report. 11/08/18.  Marland Kitchen Neurontin [Gabapentin] Other (See Comments)    Dizzy, "drugged" feeling, sleepy  . Sulfa Antibiotics Other (See Comments)    Kidney problem   . Symbicort [Budesonide-Formoterol Fumarate]     Swelling of face and inside of mouth per pt  . Penicillins Rash    Has patient had a PCN reaction causing immediate rash, facial/tongue/throat swelling, SOB or lightheadedness with hypotension: No Has patient had a PCN reaction causing severe rash involving mucus membranes or skin necrosis: No Has patient had a PCN reaction that required hospitalization No Has patient had a PCN reaction occurring within the last 10 years: No If all of the above answers are "NO", then may proceed with Cephalosporin use.  . Prednisone Rash    Patient stated she received Prednisone while she was in the hospital and experienced a rash and "extreme pain.'     Home Medications  Prior to Admission medications   Medication Sig Start Date End Date Taking? Authorizing Provider  albuterol (ACCUNEB) 0.63 MG/3ML nebulizer solution Take 1 ampule by nebulization every 6 (six) hours as needed for wheezing or shortness of breath.   Yes [provider]  albuterol (PROAIR HFA) 108 (90 Base) MCG/ACT inhaler Inhale 2 puffs into the lungs every 6 (six) hours as needed for  wheezing or shortness of breath. 11/02/17  Yes Parrett, Tammy S, NP  amLODipine (NORVASC) 10 MG tablet Take 1 tablet (10 mg total) by mouth daily. 03/21/18  Yes Hoyt Koch, MD  benzonatate (TESSALON PERLES) 100 MG capsule Take 1 capsule (100 mg total) by mouth every 6 (six) hours as needed for cough. 10/28/18 10/28/19 Yes Purohit, Konrad Dolores, MD  cyclobenzaprine (FLEXERIL) 5 MG tablet TAKE 1 TABLET(5 MG) BY MOUTH THREE TIMES DAILY AS NEEDED FOR MUSCLE SPASMS Patient taking differently: Take 5 mg by mouth 3 (three) times daily as needed for muscle spasms.  08/22/18  Yes Hoyt Koch, MD  furosemide (LASIX) 20 MG tablet Take 1 tablet (20 mg total) by mouth daily. Patient taking differently: Take 20 mg by mouth as needed for fluid.  05/27/18  Yes Parrett, Tammy S, NP  guaifenesin (HUMIBID E) 400 MG TABS tablet Take 400 mg by mouth 2 (two) times daily.   Yes [provider]  hydrALAZINE (APRESOLINE) 25 MG tablet Take 1 tablet (25 mg total) by mouth every 8 (eight) hours. 01/08/18 11/06/18 Yes Hoyt Koch, MD  labetalol (NORMODYNE) 100 MG tablet Take 1 tablet (100 mg total) by mouth 2 (two) times daily. 03/21/18  Yes Hoyt Koch, MD  pantoprazole (PROTONIX) 40 MG tablet TAKE 1 TABLET(40 MG) BY MOUTH DAILY Patient taking differently: Take 40 mg by mouth daily.  07/05/18  Yes Hoyt Koch, MD  tiotropium (SPIRIVA HANDIHALER) 18 MCG inhalation capsule Place 1 capsule (18 mcg total) into inhaler and inhale daily. 04/13/17  Yes Javier Glazier, MD  blood glucose meter kit and supplies KIT Use to test blood sugar 08/30/17   Hoyt Koch, MD  furosemide (LASIX) 40 MG tablet Take 1 tablet (40 mg total) by mouth daily for 7 days. 04/10/18 04/17/18  Juanito Doom, MD  glucose blood (IGLUCOSE TEST STRIPS) test strip Use as instructed 08/30/17   Hoyt Koch, MD  Lancets MISC Use to test blood sugar one daily 08/30/17   Hoyt Koch,  MD      The patient is critically ill with multiple organ systems failure and requires high complexity decision making for assessment and support, frequent evaluation and titration of therapies, application of advanced monitoring technologies and extensive interpretation of multiple databases.   Critical Care Time devoted to patient care services described in this note is  45  Minutes. This time reflects time of care of this signee Dr Jennet Maduro. This critical care time does not reflect procedure time, or teaching time or supervisory time of PA/NP/Med student/Med Resident etc but could involve care discussion time.  Rush Farmer, M.D. [facility] Pulmonary/Critical Care Medicine. Pager: [phone]. After hours pager: [phone].

## 2018-11-09 NOTE — Progress Notes (Signed)
PROGRESS NOTE    Victoria Bird  HYW:[id number] DOB: 07/12/1946 DOA: 11/06/2018 PCP: Hoyt Koch, MD    Brief Narrative: Patient is a 73 year old female history of chronic respiratory failure on 2 L nasal cannula chronically secondary to severe COPD with very severe obstruction on spirometry of July 2019, chronic kidney disease stage III versus 4, former tobacco user quit in 2019, hypertension presented to the ED with worsening shortness of breath and admitted for an acute COPD exacerbation.  Patient on admission was placed in the stepdown unit and placed on BiPAP, IV steroids, scheduled nebulizer treatments.  Patient subsequently improved was taken off BiPAP and transferred to the telemetry floor.  Patient still with ongoing poor air movement.  On 11/29/2018 patient with worsening shortness of breath, use of accessory muscles of respiration, and subsequently transferred back to the stepdown unit to be placed on BiPAP.   Assessment & Plan:   Principal Problem:   Acute on chronic respiratory failure with hypoxia and hypercapnia ([name]) Active Problems:   Hypertension   GERD (gastroesophageal reflux disease)   OSA (obstructive sleep apnea)   Diabetes mellitus type 2, controlled ([name])   COPD exacerbation ([name])   Tobacco abuse   COPD with acute exacerbation ([name])   Chronic respiratory failure with hypoxia ([name])   Acute exacerbation of chronic obstructive pulmonary disease (COPD) ([name])   Malnutrition of moderate degree   ARF (acute renal failure) ([name])  1 acute on chronic respiratory failure with hypoxia and hypercapnia secondary to acute COPD exacerbation Patient initially admitted with acute on chronic respiratory failure felt to be secondary to COPD exacerbation requiring BiPAP initially and subsequently on BiPAP transferred to the floor.  Influenza PCR negative.  Respiratory viral panel positive for metapneumovirus which may have triggered patient's acute COPD exacerbation.  On  assessment today to 05/22/2019 patient with increased use of accessory muscles of respiration, worsening shortness of breath, feeling of fatigue and tiredness, speaking in choppy sentences with poor air movement.  Patient with no significant improvement since transferred to the telemetry floor per patient.  Check a stat ABG, chest x-ray.  Increase Solu-Medrol to 125 mg IV every 8 hours.  Continue scheduled duo nebs, Levaquin.  Add Brovana.  Add Flonase, Claritin.  PPI.  Transferred to stepdown unit and placed on the BiPAP machine.  Consult with pulmonary and critical care medicine for further evaluation and management.  2.  Gastroesophageal reflux disease PPI.  3.  Acute on chronic kidney disease stage III Patient with a baseline creatinine from 2-2.5.  Creatinine worsening and currently at 3.15 from 2.34 on 11/07/2018.  We will check a urine sodium, urine creatinine.  Repeat UA with cultures and sensitivities.  Patient noted to have some urinary retention throughout the course of the day yesterday and overnight.  Place Foley catheter.  Check a renal ultrasound.  Follow urine output.  Follow.  4.  Moderate protein calorie malnutrition Place on nutritional supplementation.  5.  Hypertension Continue current regimen of Norvasc, hydralazine and labetalol.  Diuretics on hold.  6.  Former tobacco use Patient congratulated on tobacco cessation February 2019.  Follow.  7.  Anemia H&H stable.  Follow.  Check an anemia panel.  8.  Diabetes mellitus type 2 Hemoglobin A1c 6.1 on 11/08/2018.  CBG of 145 this morning.  Continue sliding scale insulin.   DVT prophylaxis: Heparin Code Status: Full Family Communication: Updated patient, daughter-in-law at bedside who patient agrees may have medical information. Disposition Plan: Transfer back to stepdown  unit.   Consultants:   None  Procedures:   Chest x-ray 11/08/2018, 11/06/2018  VQ scan 11/08/2018  Renal ultrasound pending 11/09/2018  2D echo  11/07/2018  Antimicrobials:  Oral Levaquin 2/7//2020>>>>> 11/12/2018  IV Levaquin 11/06/2018>>>> 11/08/2018   Subjective: Patient speaking in choppy sentences.  Patient states no improvement with shortness of breath and feels her breathing is worsening.  Patient feels she is going to tire out soon.  Patient denies any chest pain.  No nausea or vomiting.  No abdominal pain.  Objective: Vitals:   11/08/18 2128 11/08/18 2316 11/09/18 0541 11/09/18 1059  BP: (!) 142/72  (!) 150/66   Pulse: 85  69   Resp: 17  14   Temp: 98 F (36.7 C)  (!) 97.5 F (36.4 C)   TempSrc: Oral  Oral   SpO2: 95% 92% 97% 91%  Weight:      Height:        Intake/Output Summary (Last 24 hours) at 11/09/2018 1211 Last data filed at 11/09/2018 0600 Gross per 24 hour  Intake 892.41 ml  Output 1000 ml  Net -107.59 ml   Filed Weights   11/07/18 1242  Weight: 73.2 kg    Examination:  General exam: Visibly short of breath.  Use of accessory muscles of respiration. Respiratory system: Using accessory muscles of respiration.  Thoracoabdominal respirations.  Wheezing diffusely, poor air movement, tight.  No rhonchi.  No crackles.  Speaking in choppy sentences.  Cardiovascular system: Regular rate rhythm no murmurs rubs or gallops.  No JVD.  No lower extremity edema.  Gastrointestinal system: Abdomen is nondistended, soft and nontender. No organomegaly or masses felt. Normal bowel sounds heard. Central nervous system: Alert and oriented. No focal neurological deficits. Extremities: Symmetric 5 x 5 power. Skin: No rashes, lesions or ulcers Psychiatry: Judgement and insight appear normal. Mood & affect appropriate.     Data Reviewed: I have personally reviewed following labs and imaging studies  CBC: Recent Labs  Lab 11/06/18 1651 11/07/18 0452 11/09/18 0830  WBC 7.9 5.2 7.7  NEUTROABS 6.1 4.7 6.8  HGB 11.1* 10.3* 10.5*  HCT 37.7 33.2* 34.9*  MCV 92.0 89.0 88.6  PLT 159 146* 470   Basic Metabolic  Panel: Recent Labs  Lab 11/06/18 1651 11/07/18 0557 11/09/18 0830  NA 141 139 138  K 4.7 5.1 4.9  CL 106 105 104  CO2 26 24 25   GLUCOSE 142* 127* 181*  BUN 31* 46* 85*  CREATININE 2.12* 2.34* 3.15*  CALCIUM 8.5* 8.5* 8.7*  MG  --   --  2.7*   GFR: Estimated Creatinine Clearance: 15.7 mL/min (A) (by C-G formula based on SCr of 3.15 mg/dL (H)). Liver Function Tests: Recent Labs  Lab 11/06/18 1651  AST 41  ALT 36  ALKPHOS 52  BILITOT 0.8  PROT 6.8  ALBUMIN 3.5   No results for input(s): LIPASE, AMYLASE in the last 168 hours. No results for input(s): AMMONIA in the last 168 hours. Coagulation Profile: No results for input(s): INR, PROTIME in the last 168 hours. Cardiac Enzymes: Recent Labs  Lab 11/06/18 1651  TROPONINI <0.03   BNP (last 3 results) Recent Labs    04/10/18 1715  PROBNP 37.0   HbA1C: Recent Labs    11/08/18 1101  HGBA1C 6.1*   CBG: Recent Labs  Lab 11/08/18 1149 11/08/18 1739 11/08/18 2130 11/09/18 0740 11/09/18 1156  GLUCAP 171* 222* 107* 145* 143*   Lipid Profile: No results for input(s): CHOL, HDL, LDLCALC, TRIG, CHOLHDL,  LDLDIRECT in the last 72 hours. Thyroid Function Tests: No results for input(s): TSH, T4TOTAL, FREET4, T3FREE, THYROIDAB in the last 72 hours. Anemia Panel: No results for input(s): VITAMINB12, FOLATE, FERRITIN, TIBC, IRON, RETICCTPCT in the last 72 hours. Sepsis Labs: No results for input(s): PROCALCITON, LATICACIDVEN in the last 168 hours.  Recent Results (from the past 240 hour(s))  Respiratory Panel by PCR     Status: Abnormal   Collection Time: 11/07/18  2:40 PM  Result Value Ref Range Status   Adenovirus NOT DETECTED NOT DETECTED Final   Coronavirus 229E NOT DETECTED NOT DETECTED Final    Comment: (NOTE) The Coronavirus on the Respiratory Panel, DOES NOT test for the novel  Coronavirus (2019 nCoV)    Coronavirus HKU1 NOT DETECTED NOT DETECTED Final   Coronavirus NL63 NOT DETECTED NOT DETECTED Final    Coronavirus OC43 NOT DETECTED NOT DETECTED Final   Metapneumovirus DETECTED (A) NOT DETECTED Final   Rhinovirus / Enterovirus NOT DETECTED NOT DETECTED Final   Influenza A NOT DETECTED NOT DETECTED Final   Influenza B NOT DETECTED NOT DETECTED Final   Parainfluenza Virus 1 NOT DETECTED NOT DETECTED Final   Parainfluenza Virus 2 NOT DETECTED NOT DETECTED Final   Parainfluenza Virus 3 NOT DETECTED NOT DETECTED Final   Parainfluenza Virus 4 NOT DETECTED NOT DETECTED Final   Respiratory Syncytial Virus NOT DETECTED NOT DETECTED Final   Bordetella pertussis NOT DETECTED NOT DETECTED Final   Chlamydophila pneumoniae NOT DETECTED NOT DETECTED Final   Mycoplasma pneumoniae NOT DETECTED NOT DETECTED Final    Comment: Performed at [name], [address], [place], [place] [address]         Radiology Studies: Dg Chest 1 View  Result Date: 11/08/2018 CLINICAL DATA:  Emphysematous changes, COPD, correlation with V/Q scan EXAM: CHEST  1 VIEW COMPARISON:  Portable exam 1457 hours compared to 11/06/2018 FINDINGS: Normal heart size, mediastinal contours, and pulmonary vascularity. Atherosclerotic calcification aorta. Emphysematous and minimal bronchitic changes consistent with COPD. Minimally greater lucency of the RIGHT lung versus LEFT. Minimal interstitial prominence in the mid to lower lungs unchanged. No infiltrate, pleural effusion or pneumothorax. Bones demineralized. IMPRESSION: Changes of COPD and minimal chronic interstitial disease. No acute infiltrate. Electronically Signed   By: Lavonia Dana M.D.   On: 11/08/2018 15:32   Nm Pulmonary Perf And Vent  Addendum Date: 11/08/2018   ADDENDUM REPORT: 11/08/2018 15:34 ADDENDUM: Comparison is now available to a current chest radiograph of 11/08/2018. Chest radiograph demonstrates emphysematous changes with slightly greater lucency of the RIGHT lung than LEFT. This likely accounts for the mild hyperexpansion and decreased ventilation/perfusion  in the RIGHT lung versus LEFT on scintigraphy. Overall worse ventilation than perfusion in both lungs. Findings continue to represent a low probability for pulmonary embolism. Electronically Signed   By: Lavonia Dana M.D.   On: 11/08/2018 15:34   Result Date: 11/08/2018 CLINICAL DATA:  Shortness of breath and chest pain, history of interstitial lung disease, COPD, type II diabetes mellitus, hypertension EXAM: NUCLEAR MEDICINE VENTILATION - PERFUSION LUNG SCAN TECHNIQUE: Ventilation images were obtained in multiple projections using inhaled aerosol Tc-76m DTPA. Perfusion images were obtained in multiple projections after intravenous injection of Tc-23m MAA. RADIOPHARMACEUTICALS:  29.6 mCi of Tc-49m DTPA aerosol inhalation and 3.9 mCi Tc17m MAA IV COMPARISON:  None Correlation: Chest radiograph 11/06/2018; no chest radiograph in past 24 hrs FINDINGS: Ventilation: Severely irregular ventilation throughout both lungs, with patchy areas of absent and decreased ventilation in both lungs.  RIGHT lung volume appears increased versus LEFT. Most severe degrees of ventilation impairment are seen in the upper lobes bilaterally LEFT greater than RIGHT. Pattern of of ventilatory impairment is most consistent with parenchymal lung disease, a combination of COPD and minimal interstitial lung disease changes present on most recent chest radiograph. Perfusion: Much better perfusion than ventilation throughout both lungs. Generally diminished perfusion throughout the upper lobes in a nonsegmental fashion. Mild diffuse asymmetry of perfusion with less throughout RIGHT lung than LEFT. No focal segmental or subsegmental perfusion defects identified. Chest radiograph: Most recent exam demonstrates BILATERAL emphysematous changes and minimal interstitial prominence without acute infiltrate. IMPRESSION: Much worse ventilation and perfusion abnormalities throughout both lungs as above, pattern most consistent with emphysematous changes  present on chest radiograph. Low probability for pulmonary embolism. Electronically Signed: By: Lavonia Dana M.D. On: 11/08/2018 14:52        Scheduled Meds: . amLODipine  10 mg Oral Daily  . arformoterol  15 mcg Nebulization BID  . feeding supplement (ENSURE ENLIVE)  237 mL Oral BID BM  . fluticasone  2 spray Each Nare Daily  . heparin  5,000 Units Subcutaneous Q8H  . hydrALAZINE  25 mg Oral Q8H  . insulin aspart  0-9 Units Subcutaneous TID WC  . insulin glargine  10 Units Subcutaneous Daily  . ipratropium-albuterol  3 mL Nebulization QID  . labetalol  100 mg Oral BID  . levofloxacin  750 mg Oral Q48H  . loratadine  10 mg Oral Daily  . methylPREDNISolone (SOLU-MEDROL) injection  125 mg Intravenous Q8H  . pantoprazole  40 mg Oral Daily  . sodium chloride flush  3 mL Intravenous Q12H   Continuous Infusions:   LOS: 3 days    Time spent: 45 minutes    Irine Seal, MD [facility]  If 7PM-7AM, please contact night-coverage [email or web address] 11/09/2018, 12:11 PM

## 2018-11-09 NOTE — Evaluation (Addendum)
Occupational Therapy Evaluation Patient Details Name: Victoria Bird MRN: [id number] DOB: 08/20/1946 Today's Date: 11/09/2018    History of Present Illness 73 year old female history of chronic respiratory failure on 2 L nasal cannula chronically secondary to severe COPD with very severe obstruction on spirometry of July 2019, chronic kidney disease stage III versus 4, former tobacco user quit in 2019, hypertension presented to the ED with worsening shortness of breath and admitted for an acute COPD exacerbation.  Patient on admission was placed in the stepdown unit and placed on BiPAP, IV steroids, scheduled nebulizer treatments.  Patient subsequently improved was taken off BiPAP and transferred to the telemetry floor.  Patient still with ongoing poor air movement.  On 11/09/2018 patient with worsening shortness of breath, use of accessory muscles of respiration, and subsequently transferred back to the stepdown unit to be placed on BiPAP. Pt with recent admission and discharge 1/23-1/27.    Clinical Impression   This 73 y/o female presents with the above. At baseline pt reports she is mod independent with ADL and functional mobility using rollator. Limited eval today as pt with decreased respiratory status (MD arriving during session and report plan for pt to return to stepdown for further management). Pt performing bed mobility and able to maintain static sitting balance with close minguard assist for brief period. Anticipate she will require increased assist for mobility beyond EOB. Pt currently requires minguard assist for seated UB ADL. Pt initially on 2L O2 with sats decreasing to 85-86% with mobility to EOB, requires use of 3L O2 for sats to return to >90% after return to supine with cues provided throughout for deep breathing techniques. Pt will benefit from continued OT services to maximize her safety and independence with ADL and mobility prior to return home. Will follow.     Follow Up  Recommendations  SNF;Supervision/Assistance - 24 hour(pending pt progress)    Equipment Recommendations  Other (comment)(TBD in next venue)           Precautions / Restrictions Precautions Precautions: Fall Precaution Comments: monitor O2 sats, pt on 2L O2 at baseline Restrictions Weight Bearing Restrictions: No      Mobility Bed Mobility Overal bed mobility: Needs Assistance Bed Mobility: Supine to Sit;Sit to Supine     Supine to sit: Min guard Sit to supine: Min guard   General bed mobility comments: minguard for safety with HOB slightly elevated, increased effort but no physical assist required  Transfers                 General transfer comment: deferred today per MD request    Balance Overall balance assessment: Needs assistance Sitting-balance support: Feet supported;Bilateral upper extremity supported Sitting balance-Leahy Scale: Fair                                     ADL either performed or assessed with clinical judgement   ADL Overall ADL's : Needs assistance/impaired Eating/Feeding: Modified independent;Sitting   Grooming: Set up;Sitting   Upper Body Bathing: Min guard;Set up;Sitting   Lower Body Bathing: Moderate assistance;Sitting/lateral leans   Upper Body Dressing : Minimal assistance;Sitting   Lower Body Dressing: Maximal assistance;+2 for safety/equipment;Sitting/lateral leans                 General ADL Comments: pt performing bed mobility and sat EOB for few minutes today; MD arriving during session and request therapies hold off on further  activity due to pt respiratory status (pt on 2L initially with sats decreasing to mid 80s with bed mobility, increased to 3L and pt requiring increased time for sats to return to >90% after return to supine)     Vision         Perception     Praxis      Pertinent Vitals/Pain Pain Assessment: Faces Faces Pain Scale: Hurts little more Pain Location: low back Pain  Descriptors / Indicators: Aching;Sore Pain Intervention(s): Limited activity within patient's tolerance;Monitored during session;Repositioned     Hand Dominance     Extremity/Trunk Assessment Upper Extremity Assessment Upper Extremity Assessment: Generalized weakness   Lower Extremity Assessment Lower Extremity Assessment: Defer to PT evaluation       Communication Communication Communication: No difficulties   Cognition Arousal/Alertness: Awake/alert Behavior During Therapy: WFL for tasks assessed/performed;Anxious Overall Cognitive Status: Within Functional Limits for tasks assessed                                     General Comments       Exercises     Shoulder Instructions      Home Living Family/patient expects to be discharged to:: Private residence Living Arrangements: Other relatives(grandson and his two children; foster daughter) Available Help at Discharge: Family Type of Home: Mobile home Home Access: Stairs to enter Entrance Stairs-Number of Steps: 6 Entrance Stairs-Rails: Right Home Layout: One level     Bathroom Shower/Tub: Other (comment)(garden tub)   Bathroom Toilet: Standard     Home Equipment: Cane - single point;Walker - 4 wheels   Additional Comments: home setup partly obtained from previous admission      Prior Functioning/Environment Level of Independence: Independent with assistive device(s)        Comments: reports uses rollator for mobility; reports independence with ADL        OT Problem List: Decreased strength;Decreased range of motion;Decreased activity tolerance;Impaired balance (sitting and/or standing);Cardiopulmonary status limiting activity;Pain      OT Treatment/Interventions: Self-care/ADL training;Therapeutic exercise;Neuromuscular education;Energy conservation;DME and/or AE instruction;Therapeutic activities;Patient/family education;Balance training    OT Goals(Current goals can be found in the  care plan section) Acute Rehab OT Goals Patient Stated Goal: improve breathing, get stronger OT Goal Formulation: With patient Time For Goal Achievement: 11/23/18 Potential to Achieve Goals: Good  OT Frequency: Min 2X/week   Barriers to D/C:            Co-evaluation PT/OT/SLP Co-Evaluation/Treatment: Yes Reason for Co-Treatment: For patient/therapist safety;To address functional/ADL transfers;Complexity of the patient's impairments (multi-system involvement)(pt currently with decreased activity tolerance)          AM-PAC OT "6 Clicks" Daily Activity     Outcome Measure Help from another person eating meals?: None Help from another person taking care of personal grooming?: None Help from another person toileting, which includes using toliet, bedpan, or urinal?: Total Help from another person bathing (including washing, rinsing, drying)?: A Lot Help from another person to put on and taking off regular upper body clothing?: A Little Help from another person to put on and taking off regular lower body clothing?: A Lot 6 Click Score: 16   End of Session Equipment Utilized During Treatment: Oxygen Nurse Communication: Mobility status  Activity Tolerance: Patient limited by fatigue;Treatment limited secondary to medical complications (Comment)(limited due to respiratory status) Patient left: in bed;with call bell/phone within reach;with bed alarm set  OT Visit Diagnosis: Muscle  weakness (generalized) (M62.81)                Time: 9223-0097 OT Time Calculation (min): 19 min Charges:  OT General Charges $OT Visit: 1 Visit OT Evaluation $OT Eval Moderate Complexity: [place], OT [employer] Pager [phone] Office [phone]   Raymondo Band 11/09/2018, 1:18 PM

## 2018-11-09 NOTE — Progress Notes (Signed)
Spoke with patient's daughter and gave up an update on mother's health status.  Advised transferred to step down to put on bipap secondary to accessory muscle use and labored breathing.  Daughter very appreciative of update.

## 2018-11-09 NOTE — Progress Notes (Signed)
Upon reassessment, pt without output for several hours. While explaining to pt the need for a repeat bladder scan , pt became increasingly agitated and stated that she doesn't understand why we need to "put her through so much" and that she felt "pressured." This RN explained the need for the bladder scan and offered pt to move to the [facility] to void. Pt refused mobility and stated that this RN needed to "give her time to drink more fluids." On-call NP notified via text page. Orders received. Pt had no further questions or complaints at this time. Will continue to monitor.

## 2018-11-09 NOTE — Evaluation (Signed)
Physical Therapy Evaluation Patient Details Name: Victoria Bird MRN: [id number] DOB: 10-05-1945 Today's Date: 11/09/2018   History of Present Illness  73 year old female history of chronic respiratory failure on 2 L nasal cannula chronically secondary to severe COPD with very severe obstruction on spirometry of July 2019, chronic kidney disease stage III versus 4, former tobacco user quit in 2019, hypertension presented to the ED with worsening shortness of breath and admitted for an acute COPD exacerbation.  Patient on admission was placed in the stepdown unit and placed on BiPAP, IV steroids, scheduled nebulizer treatments.  Patient subsequently improved was taken off BiPAP and transferred to the telemetry floor.  Patient still with ongoing poor air movement.  On 11/09/2018 patient with worsening shortness of breath, use of accessory muscles of respiration, and subsequently transferred back to the stepdown unit to be placed on BiPAP. Pt with recent admission and discharge 1/23-1/27.   Clinical Impression   Pt presents with generalized weakness, dyspnea and desaturations on exertion requiring 2-3LO2 to maintain sats at 90%, heavy accessory muscle use for respiration, and decreased activity tolerance. Pt to benefit from acute PT to address deficits. Pt able to tolerate sitting EOB for ~3 minutes, with sats dropping to 85% upon first sitting EOB. Pt placed on 3LO2 to recover, sats back to 88-90% with increased O2. At baseline, pt on 2LO2 at home. PT and OT co-eval terminated per MD request, given work of breathing and sats. PT recommending ST-SNF stay to address deficits, pending pt improvement especially in respiratory status. PT to progress mobility as tolerated, and will continue to follow acutely.      Follow Up Recommendations SNF(pending pt progress with mobility )    Equipment Recommendations  None recommended by PT    Recommendations for Other Services       Precautions / Restrictions  Precautions Precautions: Fall Precaution Comments: monitor O2 sats, pt on 2L O2 at baseline Restrictions Weight Bearing Restrictions: No      Mobility  Bed Mobility Overal bed mobility: Needs Assistance Bed Mobility: Supine to Sit;Sit to Supine     Supine to sit: Min guard;HOB elevated Sit to supine: Min guard;HOB elevated   General bed mobility comments: Min guard for safety. Increased time and effort to perform. At start of session, pt satting 90% on 2LO2, but after sitting EOB pt dropped to mid80s. Pt instructed in breathing techniques (in through nose, out through mouth), but pt tends to breathe in and out through mouth even with cuing. Pt with heavy accessory muscle use of breathing and dyspnea 3/4.  Transfers Overall transfer level: (NT - MD stopped session due to pt's respiratory status)               General transfer comment: deferred today per MD request  Ambulation/Gait                Stairs            Wheelchair Mobility    Modified Rankin (Stroke Patients Only)       Balance Overall balance assessment: Needs assistance Sitting-balance support: Feet supported;Bilateral upper extremity supported Sitting balance-Leahy Scale: Fair Sitting balance - Comments: not able to accept challenge                                      Pertinent Vitals/Pain Pain Assessment: Faces Faces Pain Scale: Hurts little more Pain Location: low back  Pain Descriptors / Indicators: Aching;Sore Pain Intervention(s): Limited activity within patient's tolerance;Repositioned;Monitored during session    [place] expects to be discharged to:: Private residence Living Arrangements: Other relatives(grandson and his two children; foster daughter) Available Help at Discharge: Family Type of Home: Mobile home Home Access: Stairs to enter Entrance Stairs-Rails: Right Entrance Stairs-Number of Steps: 6 Home Layout: One level Home  Equipment: Cane - single point;Walker - 4 wheels Additional Comments: home setup partly obtained from previous admission    Prior Function Level of Independence: Independent with assistive device(s)         Comments: reports uses rollator for mobility; reports independence with ADL     Hand Dominance   Dominant Hand: Right    Extremity/Trunk Assessment   Upper Extremity Assessment Upper Extremity Assessment: Overall WFL for tasks assessed(Per OT eval )    Lower Extremity Assessment Lower Extremity Assessment: Generalized weakness    Cervical / Trunk Assessment Cervical / Trunk Assessment: Normal  Communication   Communication: No difficulties  Cognition Arousal/Alertness: Awake/alert Behavior During Therapy: WFL for tasks assessed/performed;Anxious Overall Cognitive Status: Within Functional Limits for tasks assessed                                 General Comments: Pt with anxiety about discussion of bipap, possibility of going to stepdown. Pt states "I think I am this way because they are making me do too much"      General Comments      Exercises     Assessment/Plan    PT Assessment Patient needs continued PT services  PT Problem List Decreased activity tolerance;Cardiopulmonary status limiting activity;Decreased balance;Decreased mobility;Decreased strength;Pain       PT Treatment Interventions Functional mobility training;Therapeutic exercise;Therapeutic activities;Patient/family education    PT Goals (Current goals can be found in the Care Plan section)  Acute Rehab PT Goals Patient Stated Goal: breathe better  PT Goal Formulation: With patient Time For Goal Achievement: 11/23/18 Potential to Achieve Goals: Good    Frequency Min 2X/week   Barriers to discharge        Co-evaluation PT/OT/SLP Co-Evaluation/Treatment: Yes Reason for Co-Treatment: Complexity of the patient's impairments (multi-system involvement);For  patient/therapist safety;To address functional/ADL transfers PT goals addressed during session: Mobility/safety with mobility OT goals addressed during session: ADL's and self-care       AM-PAC PT "6 Clicks" Mobility  Outcome Measure Help needed turning from your back to your side while in a flat bed without using bedrails?: A Little Help needed moving from lying on your back to sitting on the side of a flat bed without using bedrails?: A Little Help needed moving to and from a bed to a chair (including a wheelchair)?: A Lot Help needed standing up from a chair using your arms (e.g., wheelchair or bedside chair)?: A Lot Help needed to walk in hospital room?: A Lot Help needed climbing 3-5 steps with a railing? : A Lot 6 Click Score: 14    End of Session Equipment Utilized During Treatment: Gait belt Activity Tolerance: Patient limited by fatigue;Treatment limited secondary to medical complications (Comment)(Pt limited by dyspnea, desats ) Patient left: with call bell/phone within reach;in bed;with bed alarm set Nurse Communication: Mobility status PT Visit Diagnosis: Muscle weakness (generalized) (M62.81);Other abnormalities of gait and mobility (R26.89)    Time: 1205-1224 PT Time Calculation (min) (ACUTE ONLY): 19 min   Charges:   PT Evaluation $PT Eval Low  Complexity: 1 Low        Julien Girt, PT Acute Rehabilitation Services Pager [phone]  Office [phone]  Roxine Caddy D Elonda Husky 11/09/2018, 2:22 PM

## 2018-11-09 NOTE — Progress Notes (Signed)
PHARMACY NOTE:  ANTIMICROBIAL RENAL DOSAGE ADJUSTMENT  Current antimicrobial regimen includes a mismatch between antimicrobial dosage and estimated renal function.  As per policy approved by the Pharmacy & Therapeutics and Medical Executive Committees, the antimicrobial dosage will be adjusted accordingly.  Current antimicrobial dosage:  levaquin 750mg  q48h  Indication: COPD exacerbation  Renal Function:  Estimated Creatinine Clearance: 15.7 mL/min (A) (by C-G formula based on SCr of 3.15 mg/dL (H)). []      On intermittent HD, scheduled: []      On CRRT    Antimicrobial dosage has been changed to:  500mg  q48h  Additional comments:   Thank you for allowing pharmacy to be a part of this patient's care.  Peggyann Juba, PharmD, BCPS Pager: [phone] 11/09/2018 2:58 PM

## 2018-11-09 NOTE — Progress Notes (Signed)
Pt unable to void for several hours. Bladder scan performed and max amount shown was 176. Pt is adamant that she doesn't need to void and hasn't had much to drink. On call NP notified via text/page. See new orders. Will continue to monitor.

## 2018-11-09 NOTE — Evaluation (Signed)
SLP Cancellation Note  Patient Details Name: Victoria Bird MRN: [id number] DOB: 1946-04-24   Cancelled treatment:       Reason Eval/Treat Not Completed: Medical issues which prohibited therapy. Patient on BiPap secondary to labored breathing. Per RN, likely will be weaned off of BiPap by tomorrow (2/9). SLP will check for patient readiness to have BSE.   Sonia Baller, MA, CCC-SLP 11/09/18 3:59 PM

## 2018-11-10 LAB — CBC WITH DIFFERENTIAL/PLATELET
Abs Immature Granulocytes: 0.05 10*3/uL (ref 0.00–0.07)
Basophils Absolute: 0 10*3/uL (ref 0.0–0.1)
Basophils Relative: 0 %
Eosinophils Absolute: 0 10*3/uL (ref 0.0–0.5)
Eosinophils Relative: 0 %
HCT: 32.8 % — ABNORMAL LOW (ref 36.0–46.0)
Hemoglobin: 10.1 g/dL — ABNORMAL LOW (ref 12.0–15.0)
IMMATURE GRANULOCYTES: 1 %
LYMPHS ABS: 0.5 10*3/uL — AB (ref 0.7–4.0)
Lymphocytes Relative: 8 %
MCH: 27.5 pg (ref 26.0–34.0)
MCHC: 30.8 g/dL (ref 30.0–36.0)
MCV: 89.4 fL (ref 80.0–100.0)
Monocytes Absolute: 0.2 10*3/uL (ref 0.1–1.0)
Monocytes Relative: 3 %
Neutro Abs: 5.4 10*3/uL (ref 1.7–7.7)
Neutrophils Relative %: 88 %
Platelets: 214 10*3/uL (ref 150–400)
RBC: 3.67 MIL/uL — ABNORMAL LOW (ref 3.87–5.11)
RDW: 13.7 % (ref 11.5–15.5)
WBC: 6.1 10*3/uL (ref 4.0–10.5)
nRBC: 0 % (ref 0.0–0.2)

## 2018-11-10 LAB — IRON AND TIBC
IRON: 77 ug/dL (ref 28–170)
Saturation Ratios: 26 % (ref 10.4–31.8)
TIBC: 292 ug/dL (ref 250–450)
UIBC: 215 ug/dL

## 2018-11-10 LAB — BASIC METABOLIC PANEL
Anion gap: 8 (ref 5–15)
BUN: 83 mg/dL — ABNORMAL HIGH (ref 8–23)
CHLORIDE: 107 mmol/L (ref 98–111)
CO2: 24 mmol/L (ref 22–32)
Calcium: 8.9 mg/dL (ref 8.9–10.3)
Creatinine, Ser: 2.88 mg/dL — ABNORMAL HIGH (ref 0.44–1.00)
GFR calc Af Amer: 18 mL/min — ABNORMAL LOW (ref >60)
GFR calc non Af Amer: 16 mL/min — ABNORMAL LOW (ref >60)
Glucose, Bld: 174 mg/dL — ABNORMAL HIGH (ref 70–99)
Potassium: 5 mmol/L (ref 3.5–5.1)
Sodium: 139 mmol/L (ref 135–145)

## 2018-11-10 LAB — GLUCOSE, CAPILLARY
Glucose-Capillary: 176 mg/dL — ABNORMAL HIGH (ref 70–99)
Glucose-Capillary: 201 mg/dL — ABNORMAL HIGH (ref 70–99)
Glucose-Capillary: 147 mg/dL — ABNORMAL HIGH (ref 70–99)
Glucose-Capillary: 190 mg/dL — ABNORMAL HIGH (ref 70–99)

## 2018-11-10 LAB — FOLATE: Folate: 7.9 ng/mL (ref >5.9)

## 2018-11-10 LAB — FERRITIN: Ferritin: 204 ng/mL (ref 11–307)

## 2018-11-10 LAB — VITAMIN B12: Vitamin B-12: 832 pg/mL (ref 180–914)

## 2018-11-10 MED ORDER — TAMSULOSIN HCL 0.4 MG PO CAPS
0.4000 mg | ORAL_CAPSULE | Freq: Every day | ORAL | Status: DC
  Administered 2018-11-10 – 2018-11-29 (×10): 0.4 mg via ORAL
  Filled 2018-11-10 (×13): qty 1

## 2018-11-10 NOTE — Progress Notes (Signed)
Family visiting patient from out of town.  Child under 72 years old also present.  Discussed flu restrictions for children 12 and under with family.  Family briefly saw MD this morning and requested to see him again before leaving.  MD notified.

## 2018-11-10 NOTE — Evaluation (Signed)
Clinical/Bedside Swallow Evaluation Patient Details  Name: Victoria Bird MRN: [id number] Date of Birth: 06/22/1946  Today's Date: 11/10/2018 Time: SLP Start Time (ACUTE ONLY): 74 SLP Stop Time (ACUTE ONLY): 1641 SLP Time Calculation (min) (ACUTE ONLY): 11 min  Past Medical History:  Past Medical History:  Diagnosis Date  . Anxiety   . Chronic pain   . Colon polyps    adenomatous  . COPD (chronic obstructive pulmonary disease) ([place])   . Diabetes mellitus   . Diverticulitis   . Diverticulosis   . Emphysema lung ([place])   . Gall stones   . GERD (gastroesophageal reflux disease)   . H/O: pneumonia   . High cholesterol   . Hypertension   . OSA (obstructive sleep apnea)    no cpap  . Tracheobronchitis 01/01/2012   Past Surgical History:  Past Surgical History:  Procedure Laterality Date  . ABDOMINAL HYSTERECTOMY    . APPENDECTOMY    . Bilateral shoulder surgery    . CARPAL TUNNEL RELEASE Left   . CATARACT EXTRACTION    . CHOLECYSTECTOMY    . COLONOSCOPY    . ESOPHAGOGASTRODUODENOSCOPY    . FRACTURE SURGERY    . Right knee surgery    . ROTATOR CUFF REPAIR     HPI:  73 year old female history of chronic respiratory failure secondary to severe COPD with very severe obstruction on spirometry of July 2019, chronic kidney disease stage,  former tobacco user, GERD, DM, pna, HTN presented to the ED with worsening shortness of breath. Found to have acute COPD exacerbation, on BiPAP improved moved to stepdown however increased respiratory distress and transferred to ICU.  Per chart. Pt with recent admission and discharge 1/23-1/27. BSE 03/14/16 (and 2013) during hospitalization when she choked on piece of chicken. No s/s aspiration. Pt has history of GERD and SLP educated pt re: reflux strategies.    Assessment / Plan / Recommendation Clinical Impression  Pt demonstrates a respiratory based dysphagia marked by suspected decreased coordination of swallow and respiration in setting of  severe COPD. Immediate and delayed coughs with straw sips water- cup sips not attmepted due semi-reclined position needing SLP encouragement to stay at that height and not lower head of bed. Discussed results with pt and limitations of bedside swallow evaluation. Explained option of MBS to allow transparency of swallow function. Asked pt to think about MBS and SLP will follow up for her desicion. Will leave her on regular/thin and return for plan. Advised her of importance of sitting in upright position when eating/drinking.       SLP Visit Diagnosis: Dysphagia, unspecified (R13.10)    Aspiration Risk  Moderate aspiration risk;Severe aspiration risk    Diet Recommendation Regular;Thin liquid   Liquid Administration via: Cup;Straw Medication Administration: Whole meds with puree Supervision: Patient able to self feed;Full supervision/cueing for compensatory strategies Compensations: Slow rate;Small sips/bites Postural Changes: Seated upright at 90 degrees    Other  Recommendations Oral Care Recommendations: Oral care BID   Follow up Recommendations (TBD)      Frequency and Duration min 2x/week  2 weeks       Prognosis Prognosis for Safe Diet Advancement: Fair Barriers to Reach Goals: Severity of deficits      Swallow Study   General HPI: 73 year old female history of chronic respiratory failure secondary to severe COPD with very severe obstruction on spirometry of July 2019, chronic kidney disease stage,  former tobacco user, GERD, DM, pna, HTN presented to the ED with worsening shortness of  breath. Found to have acute COPD exacerbation, on BiPAP improved moved to stepdown however increased respiratory distress and transferred to ICU.  Per chart. Pt with recent admission and discharge 1/23-1/27. BSE 03/14/16 (and 2013) during hospitalization when she choked on piece of chicken. No s/s aspiration. Pt has history of GERD and SLP educated pt re: reflux strategies.  Type of Study: Bedside  Swallow Evaluation Previous Swallow Assessment: see HPI Diet Prior to this Study: Regular;Thin liquids Temperature Spikes Noted: No Respiratory Status: Nasal cannula History of Recent Intubation: No Behavior/Cognition: Alert;Cooperative;Requires cueing Oral Care Completed by SLP: Recent completion by staff Oral Cavity - Dentition: Poor condition;Missing dentition Vision: Functional for self-feeding Self-Feeding Abilities: Able to feed self Patient Positioning: Partially reclined Baseline Vocal Quality: Normal Volitional Cough: Strong Volitional Swallow: Able to elicit    Oral/Motor/Sensory Function Overall Oral Motor/Sensory Function: Within functional limits   Ice Chips Ice chips: Not tested   Thin Liquid Thin Liquid: Impaired Presentation: Straw Pharyngeal  Phase Impairments: Cough - Immediate;Cough - Delayed    Nectar Thick Nectar Thick Liquid: Not tested   Honey Thick Honey Thick Liquid: Not tested   Puree Puree: Not tested   Solid     Solid: Within functional limits      Houston Siren 11/10/2018,5:06 PM   Orbie Pyo [place].Ed Risk analyst [phone] Office [phone]

## 2018-11-10 NOTE — Progress Notes (Signed)
Called respiratory to notify of ordered blood gas.  Respiratory aware of order.  MD notified.

## 2018-11-10 NOTE — Progress Notes (Signed)
Transfer orders for stepdown.  Waiting on bed placement.  Charge nurse aware.

## 2018-11-10 NOTE — Progress Notes (Signed)
Charge nurse at bedside. Called report to ICU upon receiving bed placement.

## 2018-11-10 NOTE — Progress Notes (Signed)
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  PCCM was consulted for increased WOB and evolving respiratory failure.  Patient is not speaking in full sentences so difficult to get history but is full code per her request.   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  PCCM was consulted for increased WOB and evolving respiratory failure.  Patient is not speaking in full sentences so difficult to get history but is full code per her request.   Past Medical History  COPD  Significant Hospital Events   2/8 acute respiratory failure  Consults:  PCCM  Procedures:  None  Significant Diagnostic Tests:  CXR with hyperinflation RVP with metapneumo virus positive  Micro Data:  RVP metapneumovirus  Antimicrobials:  Levaquin 2/7>>>   Interim history/subjective:  Off BiPAP now but using accessory muscles Patient is refusing BiPAP  Objective   Blood pressure (!) 164/70, pulse (!) 105, temperature 97.6 F (36.4 C), temperature source Oral, resp. rate (!) 30, height '5\' 3"'  (1.6 m), weight 73.2 kg, SpO2 96 %.    FiO2 (%):  [30 %] 30 %   Intake/Output Summary (Last 24 hours) at 11/10/2018 0956 Last data filed at 11/10/2018 0600 Gross per 24 hour  Intake 3 ml  Output 1350 ml  Net -1347 ml   Filed Weights   11/07/18 1242  Weight: 73.2 kg    Examination: General: Acutely ill appearing female, NAD HENT: [place]/AT, PERRL, EOM-I and MMM Lungs: Barely audible BS diffusely  Cardiovascular: RRR, Nl S1/S2 and -M/R/G Abdomen: Soft, NT, ND and +BS Extremities: -edema and -tenderness Neuro:  Alert and interactive, moving all ext to command Skin: thin but intact  I reviewed CXR myself, hyperinflation noted with no acute findings  Resolved Hospital Problem list   N/A  Assessment & Plan:  73 year old female with severe COPD who presents to PCCM with acute on chronic respiratory failure due to COPD and metapneumovirus.  Discussed with [facility]-MD.  Acute on chronic respiratory failure:  - Keep in the ICU given respiratory status  - BiPAP, patient is now more agreeable as the alternative is intubation  - If deteriorates then will intubate  COPD:  - Brovana  - Duonebs  - PRN albuterol  - Solumedrol  - Continue levofloxacin for airway sterilization  Hypoxemia:  - Titrate O2 for sat of 88-92% not higher due to severe COPD  - May need an ambulatory desaturation study when improved for home O2 level  Metapneumovirus infection:   - Supportive care  PCCM will continue to follow  Labs   CBC: Recent Labs  Lab 11/06/18 1651 11/07/18 0452 11/09/18 0830 11/10/18 0409  WBC 7.9 5.2 7.7 6.1  NEUTROABS 6.1 4.7 6.8 5.4  HGB 11.1* 10.3* 10.5* 10.1*  HCT 37.7 33.2* 34.9* 32.8*  MCV 92.0 89.0 88.6 89.4  PLT 159 146* 221 336    Basic Metabolic Panel: Recent Labs  Lab 11/06/18 1651 11/07/18 0557 11/09/18 0830 11/10/18 0409  NA 141 139 138 139  K 4.7 5.1 4.9 5.0  CL 106 105 104 107  CO2 '26 24 25 ' 24  GLUCOSE 142* 127* 181* 174*  BUN 31* 46* 85* 83*  CREATININE 2.12* 2.34* 3.15* 2.88*  CALCIUM 8.5* 8.5* 8.7* 8.9  MG  --   --  2.7*  --    GFR: Estimated Creatinine Clearance: 17.2 mL/min (A) (by C-G formula based on SCr of 2.88 mg/dL (H)). Recent Labs  Lab 11/06/18 1651 11/07/18 0452 11/09/18 0830 11/10/18 0409  WBC 7.9 5.2 7.7 6.1    Liver Function Tests: Recent Labs  Lab 11/06/18 1651  AST 41  ALT 36  ALKPHOS 52  BILITOT 0.8  PROT 6.8  ALBUMIN 3.5   No results for input(s): LIPASE, AMYLASE in the last 168 hours. No results for input(s): AMMONIA in the  last 168 hours.  ABG    Component Value Date/Time   PHART 7.316 (L) 11/06/2018 1655   PCO2ART 55.2 (H) 11/06/2018 1655   PO2ART 38.5 (LL) 11/06/2018 1655   HCO3 27.4 11/06/2018 1655   TCO2 33 03/10/2016 0103   ACIDBASEDEF 0.9 11/10/2017 0540   O2SAT 68.4 11/06/2018 1655     Coagulation Profile: No results for input(s): INR, PROTIME in the last 168 hours.  Cardiac Enzymes: Recent Labs  Lab 11/06/18 1651  TROPONINI <0.03    HbA1C: Hgb A1c MFr Bld  Date/Time Value Ref Range Status  11/08/2018 11:01 AM 6.1 (H) 4.8 - 5.6 % Final    Comment:    (NOTE) Pre diabetes:          5.7%-6.4% Diabetes:              >6.4% Glycemic control for   <7.0% adults with diabetes   10/25/2018 12:21 AM 5.4 4.8 - 5.6 % Final    Comment:    (NOTE) Pre diabetes:          5.7%-6.4% Diabetes:              >6.4% Glycemic control for   <7.0% adults with diabetes     CBG: Recent Labs  Lab 11/09/18 0740 11/09/18 1156 11/09/18 1629 11/09/18 2144 11/10/18 0749  GLUCAP 145* 143* 137* 153* 176*    Review of Systems:   Unattainable, patient is very SOB  Past Medical History  She,  has a past medical history of Anxiety, Chronic pain, Colon polyps, COPD (chronic obstructive pulmonary disease) ([place]), Diabetes mellitus, Diverticulitis, Diverticulosis, Emphysema lung ([place]), Gall stones, GERD (gastroesophageal reflux disease), H/O: pneumonia, High cholesterol, Hypertension, OSA (obstructive sleep apnea), and Tracheobronchitis (01/01/2012).   Surgical History    Past Surgical History:  Procedure Laterality Date  . ABDOMINAL HYSTERECTOMY    . APPENDECTOMY    . Bilateral shoulder surgery    . CARPAL TUNNEL RELEASE Left   . CATARACT EXTRACTION    . CHOLECYSTECTOMY    . COLONOSCOPY    . ESOPHAGOGASTRODUODENOSCOPY    . FRACTURE SURGERY    . Right knee surgery    . ROTATOR CUFF REPAIR       Social History   reports that she quit smoking about a year ago. Her smoking use included cigarettes.  She started smoking about 44 years ago. She has a 61.50 pack-year smoking history. She has never used smokeless tobacco. She reports that she does not drink alcohol or use drugs.   Family History   Her family history includes Alcohol abuse in her father; Diabetes in her maternal grandfather; Heart attack in her maternal grandfather, maternal grandmother, and mother; Hypertension in her maternal grandfather and maternal grandmother; Stroke in her maternal grandfather. There is no  history of Thyroid disease, Lung disease, Colon cancer, or Breast cancer.   Allergies Allergies  Allergen Reactions  . Aspartame And Phenylalanine Nausea And Vomiting    Patient says she is allergic to all artificial sweeteners  . Doxycycline Other (See Comments)    Nausea, vomiting, HA, double vision  . Tramadol Shortness Of Breath and Nausea Only    Can tolerate Morphine/Dilaudid IV, Percocet per pt report. 11/08/18.  Marland Kitchen Neurontin [Gabapentin] Other (See Comments)    Dizzy, "drugged" feeling, sleepy  . Sulfa Antibiotics Other (See Comments)    Kidney problem   . Symbicort [Budesonide-Formoterol Fumarate]     Swelling of face and inside of mouth per pt  . Penicillins Rash    Has patient had a PCN reaction causing immediate rash, facial/tongue/throat swelling, SOB or lightheadedness with hypotension: No Has patient had a PCN reaction causing severe rash involving mucus membranes or skin necrosis: No Has patient had a PCN reaction that required hospitalization No Has patient had a PCN reaction occurring within the last 10 years: No If all of the above answers are "NO", then may proceed with Cephalosporin use.  . Prednisone Rash    Patient stated she received Prednisone while she was in the hospital and experienced a rash and "extreme pain.'     Home Medications  Prior to Admission medications   Medication Sig Start Date End Date Taking? Authorizing Provider  albuterol (ACCUNEB) 0.63 MG/3ML nebulizer solution Take  1 ampule by nebulization every 6 (six) hours as needed for wheezing or shortness of breath.   Yes [provider]  albuterol (PROAIR HFA) 108 (90 Base) MCG/ACT inhaler Inhale 2 puffs into the lungs every 6 (six) hours as needed for wheezing or shortness of breath. 11/02/17  Yes Parrett, Tammy S, NP  amLODipine (NORVASC) 10 MG tablet Take 1 tablet (10 mg total) by mouth daily. 03/21/18  Yes Hoyt Koch, MD  benzonatate (TESSALON PERLES) 100 MG capsule Take 1 capsule (100 mg total) by mouth every 6 (six) hours as needed for cough. 10/28/18 10/28/19 Yes Purohit, Konrad Dolores, MD  cyclobenzaprine (FLEXERIL) 5 MG tablet TAKE 1 TABLET(5 MG) BY MOUTH THREE TIMES DAILY AS NEEDED FOR MUSCLE SPASMS Patient taking differently: Take 5 mg by mouth 3 (three) times daily as needed for muscle spasms.  08/22/18  Yes Hoyt Koch, MD  furosemide (LASIX) 20 MG tablet Take 1 tablet (20 mg total) by mouth daily. Patient taking differently: Take 20 mg by mouth as needed for fluid.  05/27/18  Yes Parrett, Tammy S, NP  guaifenesin (HUMIBID E) 400 MG TABS tablet Take 400 mg by mouth 2 (two) times daily.   Yes [provider]  hydrALAZINE (APRESOLINE) 25 MG tablet Take 1 tablet (25 mg total) by mouth every 8 (eight) hours. 01/08/18 11/06/18 Yes Hoyt Koch, MD  labetalol (NORMODYNE) 100 MG tablet Take 1 tablet (100 mg total) by mouth 2 (two) times daily. 03/21/18  Yes Hoyt Koch, MD  pantoprazole (PROTONIX) 40 MG tablet TAKE 1 TABLET(40 MG) BY MOUTH DAILY Patient taking differently: Take 40 mg by mouth daily.  07/05/18  Yes Hoyt Koch, MD  tiotropium (SPIRIVA HANDIHALER) 18 MCG inhalation capsule Place 1 capsule (18 mcg total) into inhaler and inhale daily. 04/13/17  Yes Javier Glazier, MD  blood glucose meter kit and supplies KIT Use to test blood sugar 08/30/17   Hoyt Koch, MD  furosemide (LASIX) 40 MG tablet Take 1 tablet (40 mg total)  by mouth daily for 7  days. 04/10/18 04/17/18  Juanito Doom, MD  glucose blood (IGLUCOSE TEST STRIPS) test strip Use as instructed 08/30/17   Hoyt Koch, MD  Lancets MISC Use to test blood sugar one daily 08/30/17   Hoyt Koch, MD    The patient is critically ill with multiple organ systems failure and requires high complexity decision making for assessment and support, frequent evaluation and titration of therapies, application of advanced monitoring technologies and extensive interpretation of multiple databases.   Critical Care Time devoted to patient care services described in this note is  33  Minutes. This time reflects time of care of this signee Dr Jennet Maduro. This critical care time does not reflect procedure time, or teaching time or supervisory time of PA/NP/Med student/Med Resident etc but could involve care discussion time.  Rush Farmer, M.D. [facility] Pulmonary/Critical Care Medicine. Pager: [phone]. After hours pager: [phone].

## 2018-11-10 NOTE — Progress Notes (Signed)
PROGRESS NOTE    Victoria Bird  QVZ:[id number] DOB: 01/05/1946 DOA: 11/06/2018 PCP: Hoyt Koch, MD    Brief Narrative: Patient is a 73 year old female history of chronic respiratory failure on 2 L nasal cannula chronically secondary to severe COPD with very severe obstruction on spirometry of July 2019, chronic kidney disease stage III versus 4, former tobacco user quit in 2019, hypertension presented to the ED with worsening shortness of breath and admitted for an acute COPD exacerbation.  Patient on admission was placed in the stepdown unit and placed on BiPAP, IV steroids, scheduled nebulizer treatments.  Patient subsequently improved was taken off BiPAP and transferred to the telemetry floor.  Patient still with ongoing poor air movement.  On 11/29/2018 patient with worsening shortness of breath, use of accessory muscles of respiration, and subsequently transferred back to the stepdown unit to be placed on BiPAP.   Assessment & Plan:   Principal Problem:   Acute on chronic respiratory failure with hypoxia and hypercapnia ([name]) Active Problems:   Hypertension   GERD (gastroesophageal reflux disease)   OSA (obstructive sleep apnea)   Diabetes mellitus type 2, controlled ([name])   COPD exacerbation ([name])   Tobacco abuse   COPD with acute exacerbation ([name])   Chronic respiratory failure with hypoxia ([name])   Acute exacerbation of chronic obstructive pulmonary disease (COPD) ([name])   Malnutrition of moderate degree   ARF (acute renal failure) ([name])  1 acute on chronic respiratory failure with hypoxia and hypercapnia secondary to acute COPD exacerbation Patient initially admitted with acute on chronic respiratory failure felt to be secondary to COPD exacerbation requiring BiPAP initially and subsequently on BiPAP transferred to the floor.  Influenza PCR negative.  Respiratory viral panel positive for metapneumovirus which may have triggered patient's acute COPD exacerbation.  On  assessment on 11/09/2018, patient with increased use of accessory muscles of respiration, worsening shortness of breath, feeling of fatigue and tiredness, speaking in choppy sentences with poor air movement.  Patient with no significant improvement since transferred to the telemetry floor per patient.  ABG was ordered however discontinued per PCCM.  Patient transferred to the stepdown unit and placed on the BiPAP.  Patient was on BiPAP overnight which has been removed this morning.  Patient still with accessory use of muscles of respiration, thoracoabdominal breathing.  Continue Solu-Medrol 125 mg IV every 8 hours, scheduled duo nebs, Claritin, Protonix, Flonase, Levaquin, Brovana.  BiPAP.  If patient fails BiPAP and continues to worsen may need to be intubated.  PCCM has been consulted and are following.   2.  Gastroesophageal reflux disease Continue PPI.  3.  Acute on chronic kidney disease stage III Patient with a baseline creatinine from 2-2.5.  Creatinine was worsening and starting to trend back down after Foley catheter placement.  Urine output of 1.350 L over the past 24 hours.  Creatinine currently at 2.88 from 3.15 from 2.34 on 11/07/2018.  We will check a urine sodium, urine creatinine.  Repeat UA nitrite negative, leukocytes negative, protein negative.  Urine sodium was 15, urine creatinine of 124.04.  Patient noted to have some urinary retention throughout the course of the 11/08/2018.  Foley catheter ordered and placed.  Patient with urine output of 1.350 L over the past 24 hours.  Renal ultrasound with echogenic, thin renal parenchyma bilaterally.  No hydronephrosis or suspicious renal mass.  Follow.   4.  Moderate protein calorie malnutrition Continue nutritional supplementation.   5.  Hypertension Blood pressure somewhat elevated however  may be due to acute respiratory distress.  Continue current regimen of Norvasc, hydralazine and labetalol.  Diuretics on hold.  6.  Former tobacco  use Patient congratulated on tobacco cessation February 2019.  Follow.  7.  Anemia of chronic disease H&H stable.  Follow.    8.  Diabetes mellitus type 2 Hemoglobin A1c 6.1 on 11/08/2018.  CBG of 176 this morning.  Continue Lantus 10 units daily and sliding scale insulin.   9.  urinary retention Foley catheter placed.  Renal ultrasound negative for hydronephrosis or suspicious renal mass.  Will place on Flomax.  Voiding trial in about  5 days.   DVT prophylaxis: Heparin Code Status: Full Family Communication: Updated patient.  No family at bedside. Disposition Plan: Remain in stepdown unit.   Consultants:   PCCM: Dr. Nelda Marseille 11/09/2018  Procedures:   Chest x-ray 11/08/2018, 11/06/2018  VQ scan 11/08/2018  Renal ultrasound 11/09/2018  2D echo 11/07/2018  Antimicrobials:  Oral Levaquin 2/7//2020>>>>> 11/12/2018  IV Levaquin 11/06/2018>>>> 11/08/2018   Subjective: Patient laying in bed.  States shortness of breath improving from yesterday however still speaking in choppy sentences and does visibly look short of breath.  Patient was on BiPAP overnight per RN and was taken off today.  Patient very reluctant to go back on the BiPAP.  Denies any chest pain.  No nausea or vomiting.  No abdominal pain.   Objective: Vitals:   11/10/18 0700 11/10/18 0800 11/10/18 0802 11/10/18 0855  BP: (!) 167/69  (!) 154/70   Pulse: 79  96   Resp: (!) 23  20   Temp:  97.6 F (36.4 C)    TempSrc:  Oral    SpO2: 97%  94% 92%  Weight:      Height:        Intake/Output Summary (Last 24 hours) at 11/10/2018 0936 Last data filed at 11/10/2018 0600 Gross per 24 hour  Intake 3 ml  Output 1350 ml  Net -1347 ml   Filed Weights   11/07/18 1242  Weight: 73.2 kg    Examination:  General exam: Visibly short of breath.  Use of accessory muscles of respiration.   Respiratory system: Thoracoabdominal breathing.  Use of accessory muscles of respiration.  Diffuse wheezing.  Poor air movement.  No crackles.  No  rhonchi.  Speaking in choppy sentences.  Cardiovascular system: RRR no murmurs rubs or gallops.  No JVD.  No lower extremity edema.  Gastrointestinal system: Abdomen is soft, nontender, nondistended, positive bowel sounds.  No rebound.  No guarding.  Central nervous system: Alert and oriented. No focal neurological deficits. Extremities: Symmetric 5 x 5 power. Skin: No rashes, lesions or ulcers Psychiatry: Judgement and insight appear normal. Mood & affect appropriate.     Data Reviewed: I have personally reviewed following labs and imaging studies  CBC: Recent Labs  Lab 11/06/18 1651 11/07/18 0452 11/09/18 0830 11/10/18 0409  WBC 7.9 5.2 7.7 6.1  NEUTROABS 6.1 4.7 6.8 5.4  HGB 11.1* 10.3* 10.5* 10.1*  HCT 37.7 33.2* 34.9* 32.8*  MCV 92.0 89.0 88.6 89.4  PLT 159 146* 221 242   Basic Metabolic Panel: Recent Labs  Lab 11/06/18 1651 11/07/18 0557 11/09/18 0830 11/10/18 0409  NA 141 139 138 139  K 4.7 5.1 4.9 5.0  CL 106 105 104 107  CO2 26 24 25 24   GLUCOSE 142* 127* 181* 174*  BUN 31* 46* 85* 83*  CREATININE 2.12* 2.34* 3.15* 2.88*  CALCIUM 8.5* 8.5* 8.7* 8.9  MG  --   --  2.7*  --    GFR: Estimated Creatinine Clearance: 17.2 mL/min (A) (by C-G formula based on SCr of 2.88 mg/dL (H)). Liver Function Tests: Recent Labs  Lab 11/06/18 1651  AST 41  ALT 36  ALKPHOS 52  BILITOT 0.8  PROT 6.8  ALBUMIN 3.5   No results for input(s): LIPASE, AMYLASE in the last 168 hours. No results for input(s): AMMONIA in the last 168 hours. Coagulation Profile: No results for input(s): INR, PROTIME in the last 168 hours. Cardiac Enzymes: Recent Labs  Lab 11/06/18 1651  TROPONINI <0.03   BNP (last 3 results) Recent Labs    04/10/18 1715  PROBNP 37.0   HbA1C: Recent Labs    11/08/18 1101  HGBA1C 6.1*   CBG: Recent Labs  Lab 11/09/18 0740 11/09/18 1156 11/09/18 1629 11/09/18 2144 11/10/18 0749  GLUCAP 145* 143* 137* 153* 176*   Lipid Profile: No results  for input(s): CHOL, HDL, LDLCALC, TRIG, CHOLHDL, LDLDIRECT in the last 72 hours. Thyroid Function Tests: No results for input(s): TSH, T4TOTAL, FREET4, T3FREE, THYROIDAB in the last 72 hours. Anemia Panel: Recent Labs    11/10/18 0409  VITAMINB12 832  FOLATE 7.9  FERRITIN 204  TIBC 292  IRON 77   Sepsis Labs: No results for input(s): PROCALCITON, LATICACIDVEN in the last 168 hours.  Recent Results (from the past 240 hour(s))  Respiratory Panel by PCR     Status: Abnormal   Collection Time: 11/07/18  2:40 PM  Result Value Ref Range Status   Adenovirus NOT DETECTED NOT DETECTED Final   Coronavirus 229E NOT DETECTED NOT DETECTED Final    Comment: (NOTE) The Coronavirus on the Respiratory Panel, DOES NOT test for the novel  Coronavirus (2019 nCoV)    Coronavirus HKU1 NOT DETECTED NOT DETECTED Final   Coronavirus NL63 NOT DETECTED NOT DETECTED Final   Coronavirus OC43 NOT DETECTED NOT DETECTED Final   Metapneumovirus DETECTED (A) NOT DETECTED Final   Rhinovirus / Enterovirus NOT DETECTED NOT DETECTED Final   Influenza A NOT DETECTED NOT DETECTED Final   Influenza B NOT DETECTED NOT DETECTED Final   Parainfluenza Virus 1 NOT DETECTED NOT DETECTED Final   Parainfluenza Virus 2 NOT DETECTED NOT DETECTED Final   Parainfluenza Virus 3 NOT DETECTED NOT DETECTED Final   Parainfluenza Virus 4 NOT DETECTED NOT DETECTED Final   Respiratory Syncytial Virus NOT DETECTED NOT DETECTED Final   Bordetella pertussis NOT DETECTED NOT DETECTED Final   Chlamydophila pneumoniae NOT DETECTED NOT DETECTED Final   Mycoplasma pneumoniae NOT DETECTED NOT DETECTED Final    Comment: Performed at [facility], [address], [place], [place] [address]  MRSA PCR Screening     Status: None   Collection Time: 11/09/18  1:35 PM  Result Value Ref Range Status   MRSA by PCR NEGATIVE NEGATIVE Final    Comment:        The GeneXpert MRSA Assay (FDA approved for NASAL specimens only), is one component of  a comprehensive MRSA colonization surveillance program. It is not intended to diagnose MRSA infection nor to guide or monitor treatment for MRSA infections. Performed at [facility], [place] [address], [place], [place] [address]          Radiology Studies: Dg Chest 1 View  Result Date: 11/08/2018 CLINICAL DATA:  Emphysematous changes, COPD, correlation with V/Q scan EXAM: CHEST  1 VIEW COMPARISON:  Portable exam 1457 hours compared to 11/06/2018 FINDINGS: Normal heart size, mediastinal contours, and pulmonary  vascularity. Atherosclerotic calcification aorta. Emphysematous and minimal bronchitic changes consistent with COPD. Minimally greater lucency of the RIGHT lung versus LEFT. Minimal interstitial prominence in the mid to lower lungs unchanged. No infiltrate, pleural effusion or pneumothorax. Bones demineralized. IMPRESSION: Changes of COPD and minimal chronic interstitial disease. No acute infiltrate. Electronically Signed   By: Lavonia Dana M.D.   On: 11/08/2018 15:32   US Renal  Result Date: 11/09/2018 CLINICAL DATA:  Acute renal failure. EXAM: RENAL / URINARY TRACT ULTRASOUND COMPLETE COMPARISON:  CT of the abdomen and pelvis on 12/16/2016 FINDINGS: Right Kidney: Renal measurements: 8.4 x 3.9 x 5.6 centimeters = volume: 96.6 mL. Renal parenchyma is echogenic and thin. No suspicious mass. No hydronephrosis. Left Kidney: Renal measurements: 8.6 x 4.5 x 4.8 centimeters = volume: 98.6 mL. Echogenic renal parenchyma and thin. No suspicious renal mass or hydronephrosis. Bladder: Appears normal for degree of bladder distention. IMPRESSION: Echogenic, thin renal parenchyma bilaterally. No hydronephrosis or suspicious renal mass. Electronically Signed   By: Nolon Nations M.D.   On: 11/09/2018 12:49   Nm Pulmonary Perf And Vent  Addendum Date: 11/08/2018   ADDENDUM REPORT: 11/08/2018 15:34 ADDENDUM: Comparison is now available to a current chest radiograph of 11/08/2018. Chest  radiograph demonstrates emphysematous changes with slightly greater lucency of the RIGHT lung than LEFT. This likely accounts for the mild hyperexpansion and decreased ventilation/perfusion in the RIGHT lung versus LEFT on scintigraphy. Overall worse ventilation than perfusion in both lungs. Findings continue to represent a low probability for pulmonary embolism. Electronically Signed   By: Lavonia Dana M.D.   On: 11/08/2018 15:34   Result Date: 11/08/2018 CLINICAL DATA:  Shortness of breath and chest pain, history of interstitial lung disease, COPD, type II diabetes mellitus, hypertension EXAM: NUCLEAR MEDICINE VENTILATION - PERFUSION LUNG SCAN TECHNIQUE: Ventilation images were obtained in multiple projections using inhaled aerosol Tc-48m DTPA. Perfusion images were obtained in multiple projections after intravenous injection of Tc-55m MAA. RADIOPHARMACEUTICALS:  29.6 mCi of Tc-64m DTPA aerosol inhalation and 3.9 mCi Tc42m MAA IV COMPARISON:  None Correlation: Chest radiograph 11/06/2018; no chest radiograph in past 24 hrs FINDINGS: Ventilation: Severely irregular ventilation throughout both lungs, with patchy areas of absent and decreased ventilation in both lungs. RIGHT lung volume appears increased versus LEFT. Most severe degrees of ventilation impairment are seen in the upper lobes bilaterally LEFT greater than RIGHT. Pattern of of ventilatory impairment is most consistent with parenchymal lung disease, a combination of COPD and minimal interstitial lung disease changes present on most recent chest radiograph. Perfusion: Much better perfusion than ventilation throughout both lungs. Generally diminished perfusion throughout the upper lobes in a nonsegmental fashion. Mild diffuse asymmetry of perfusion with less throughout RIGHT lung than LEFT. No focal segmental or subsegmental perfusion defects identified. Chest radiograph: Most recent exam demonstrates BILATERAL emphysematous changes and minimal interstitial  prominence without acute infiltrate. IMPRESSION: Much worse ventilation and perfusion abnormalities throughout both lungs as above, pattern most consistent with emphysematous changes present on chest radiograph. Low probability for pulmonary embolism. Electronically Signed: By: Lavonia Dana M.D. On: 11/08/2018 14:52   Dg Chest Port 1 View  Result Date: 11/09/2018 CLINICAL DATA:  73 year old female with history of coughing and shortness of breath. EXAM: PORTABLE CHEST 1 VIEW COMPARISON:  Chest x-ray 11/08/2018. FINDINGS: Lung volumes are normal. No consolidative airspace disease. No pleural effusions. No pneumothorax. No pulmonary nodule or mass noted. Pulmonary vasculature and the cardiomediastinal silhouette are within normal limits. Aortic atherosclerosis. IMPRESSION: 1. No radiographic evidence  of acute cardiopulmonary disease. 2. Aortic atherosclerosis. Electronically Signed   By: Vinnie Langton M.D.   On: 11/09/2018 13:54        Scheduled Meds: . amLODipine  10 mg Oral Daily  . arformoterol  15 mcg Nebulization BID  . feeding supplement (ENSURE ENLIVE)  237 mL Oral BID BM  . fluticasone  2 spray Each Nare Daily  . heparin  5,000 Units Subcutaneous Q8H  . hydrALAZINE  25 mg Oral Q8H  . insulin aspart  0-9 Units Subcutaneous TID WC  . insulin glargine  10 Units Subcutaneous Daily  . ipratropium-albuterol  3 mL Nebulization QID  . labetalol  100 mg Oral BID  . levofloxacin  500 mg Oral Q48H  . loratadine  10 mg Oral Daily  . methylPREDNISolone (SOLU-MEDROL) injection  125 mg Intravenous Q8H  . pantoprazole  40 mg Oral Daily  . sodium chloride flush  3 mL Intravenous Q12H   Continuous Infusions:   LOS: 4 days    Time spent: 40 minutes    Irine Seal, MD [facility]  If 7PM-7AM, please contact night-coverage [email or web address] 11/10/2018, 9:36 AM

## 2018-11-11 ENCOUNTER — Inpatient Hospital Stay (HOSPITAL_COMMUNITY): Payer: Medicare Other

## 2018-11-11 LAB — CBC WITH DIFFERENTIAL/PLATELET
ABS IMMATURE GRANULOCYTES: 0.1 10*3/uL — AB (ref 0.00–0.07)
Basophils Absolute: 0 10*3/uL (ref 0.0–0.1)
Basophils Relative: 0 %
Eosinophils Absolute: 0 10*3/uL (ref 0.0–0.5)
Eosinophils Relative: 0 %
HCT: 33.3 % — ABNORMAL LOW (ref 36.0–46.0)
Hemoglobin: 10.1 g/dL — ABNORMAL LOW (ref 12.0–15.0)
IMMATURE GRANULOCYTES: 2 %
Lymphocytes Relative: 8 %
Lymphs Abs: 0.5 10*3/uL — ABNORMAL LOW (ref 0.7–4.0)
MCH: 27.4 pg (ref 26.0–34.0)
MCHC: 30.3 g/dL (ref 30.0–36.0)
MCV: 90.5 fL (ref 80.0–100.0)
Monocytes Absolute: 0.2 10*3/uL (ref 0.1–1.0)
Monocytes Relative: 4 %
NEUTROS ABS: 5.4 10*3/uL (ref 1.7–7.7)
NEUTROS PCT: 86 %
Platelets: 190 10*3/uL (ref 150–400)
RBC: 3.68 MIL/uL — ABNORMAL LOW (ref 3.87–5.11)
RDW: 13.8 % (ref 11.5–15.5)
WBC: 6.2 10*3/uL (ref 4.0–10.5)
nRBC: 0 % (ref 0.0–0.2)

## 2018-11-11 LAB — BASIC METABOLIC PANEL
Anion gap: 9 (ref 5–15)
BUN: 74 mg/dL — ABNORMAL HIGH (ref 8–23)
CO2: 24 mmol/L (ref 22–32)
Calcium: 8.9 mg/dL (ref 8.9–10.3)
Chloride: 107 mmol/L (ref 98–111)
Creatinine, Ser: 2.45 mg/dL — ABNORMAL HIGH (ref 0.44–1.00)
GFR calc non Af Amer: 19 mL/min — ABNORMAL LOW (ref >60)
GFR, EST AFRICAN AMERICAN: 22 mL/min — AB (ref >60)
Glucose, Bld: 174 mg/dL — ABNORMAL HIGH (ref 70–99)
Potassium: 5 mmol/L (ref 3.5–5.1)
Sodium: 140 mmol/L (ref 135–145)

## 2018-11-11 LAB — GLUCOSE, CAPILLARY
Glucose-Capillary: 188 mg/dL — ABNORMAL HIGH (ref 70–99)
Glucose-Capillary: 305 mg/dL — ABNORMAL HIGH (ref 70–99)
Glucose-Capillary: 201 mg/dL — ABNORMAL HIGH (ref 70–99)
Glucose-Capillary: 219 mg/dL — ABNORMAL HIGH (ref 70–99)
Glucose-Capillary: 203 mg/dL — ABNORMAL HIGH (ref 70–99)

## 2018-11-11 NOTE — Progress Notes (Addendum)
NAME:  Victoria Bird, MRN:  [id number], DOB:  03-21-1946, LOS: 5 ADMISSION DATE:  11/06/2018, CONSULTATION DATE:  11/09/2018 REFERRING MD:  Nile RiggsGrandville Silos, CHIEF COMPLAINT:  Acute on chronic respiratory failure   Brief History   73 year old female with ES-COPD who presents in respiratory failure acute on chronic due to metapneumo virus infection.  PCCM was consulted for increased WOB and evolving respiratory failure.  Patient is not speaking in full sentences so difficult to get history but is full code per her request.   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  PCCM was consulted for increased WOB and evolving respiratory failure.  Patient is not speaking in full sentences so difficult to get history but is full code per her request.   Past Medical History  COPD  Significant Hospital Events   2/8 acute respiratory failure  Consults:  PCCM  Procedures:  None  Significant Diagnostic Tests:  CXR with hyperinflation RVP with metapneumo virus positive  Micro Data:  RVP metapneumovirus  Antimicrobials:  Levaquin 2/7>>>   Interim history/subjective:  No longer requiring BiPAP since the age evening of admission.  She does state that she dislikes wearing the mask Comfortable respiratory pattern.  Denies cough, denies wheezing.  Objective   Blood pressure 94/70, pulse (!) 114, temperature 98 F (36.7 C), temperature source Oral, resp. rate (!) 28, height 5\' 3"  (1.6 m), weight 70.9 kg, SpO2 98 %.        Intake/Output Summary (Last 24 hours) at 11/11/2018 0951 Last data filed at 11/11/2018 0000 Gross per 24 hour  Intake 480 ml  Output 1100 ml  Net -620 ml   Filed Weights   11/07/18 1242 11/10/18 1137 11/11/18 0500  Weight: 73.2 kg 77.2 kg 70.9 kg    Examination: General: Obese chronically ill, no distress currently HENT: Oropharynx clear, no stridor Lungs: Good  air movement, no wheeze on normal respiration.  She does have wheeze on forced expiration. Cardiovascular: Regular, tachycardic, no murmur Abdomen: Soft, obese, positive bowel sounds Extremities: No edema Neuro: Awake, alert, interacting, oriented to place and time, moves all extremities Skin: No rash, few scattered ecchymoses on her arms  Chest x-ray 11/11/2018 reviewed by me, no infiltrates noted  Resolved Hospital Problem list   N/A  Assessment & Plan:  73 year old female with severe COPD who presents to PCCM with acute on chronic respiratory failure due to COPD and metapneumovirus.  Discussed with [facility]-MD.  Acute on chronic respiratory failure, improved significantly:  -should be able to change BiPAP to as needed, hopefully avoid going forward.  She does want to be treated aggressively but has difficulty tolerating the mask.  That could mean that mechanical ventilation is best alternative should she decompensate  -Management for her COPD as below  COPD:  -Stop Brovana  -Continue scheduled DuoNeb 4 times daily with albuterol nebs as needed  -Start to taper steroids, continue IV for another day and switch to prednisone with a planned extended taper probably on 2/11  -Agree with 5 days of antibiotics, can likely change her levofloxacin to p.o.  -Start to mobilize.  Question whether she can DC Foley catheter and get out of bed.  Defer to internal medicine  -She was only on Spiriva at home.  Would recommend change to a LABA/LAMA in preparation for discharge.  Consider Anoro or Stiolto  -Wean oxygen as able.  Plan for walking oximetry prior to discharge to ensure she does not need supplemental oxygen at home at least temporarily  -f/u visit made for her on 11/18/18 at 14:00 in our office.   Metapneumovirus infection:   -Supportive care   Labs   CBC: Recent Labs  Lab 11/06/18 1651 11/07/18 0452 11/09/18 0830 11/10/18 0409 11/11/18 0345  WBC 7.9 5.2 7.7 6.1 6.2  NEUTROABS 6.1 4.7  6.8 5.4 5.4  HGB 11.1* 10.3* 10.5* 10.1* 10.1*  HCT 37.7 33.2* 34.9* 32.8* 33.3*  MCV 92.0 89.0 88.6 89.4 90.5  PLT 159 146* 221 214 962    Basic Metabolic Panel: Recent Labs  Lab 11/06/18 1651 11/07/18 0557 11/09/18 0830 11/10/18 0409 11/11/18 0345  NA 141 139 138 139 140  K 4.7 5.1 4.9 5.0 5.0  CL 106 105 104 107 107  CO2 26 24 25 24 24   GLUCOSE 142* 127* 181* 174* 174*  BUN 31* 46* 85* 83* 74*  CREATININE 2.12* 2.34* 3.15* 2.88* 2.45*  CALCIUM 8.5* 8.5* 8.7* 8.9 8.9  MG  --   --  2.7*  --   --    GFR: Estimated Creatinine Clearance: 19.9 mL/min (A) (by C-G formula based on SCr of 2.45 mg/dL (H)). Recent Labs  Lab 11/07/18 0452 11/09/18 0830 11/10/18 0409 11/11/18 0345  WBC 5.2 7.7 6.1 6.2    Liver Function Tests: Recent Labs  Lab 11/06/18 1651  AST 41  ALT 36  ALKPHOS 52  BILITOT 0.8  PROT 6.8  ALBUMIN 3.5   No results for input(s): LIPASE, AMYLASE in the last 168 hours. No results for input(s): AMMONIA in the last 168 hours.  ABG    Component Value Date/Time   PHART 7.316 (L) 11/06/2018 1655   PCO2ART 55.2 (H) 11/06/2018 1655   PO2ART 38.5 (LL) 11/06/2018 1655   HCO3 27.4 11/06/2018 1655   TCO2 33 03/10/2016 0103   ACIDBASEDEF 0.9 11/10/2017 0540   O2SAT 68.4 11/06/2018 1655     Coagulation Profile: No results for input(s): INR, PROTIME in the last 168 hours.  Cardiac Enzymes: Recent Labs  Lab 11/06/18 1651  TROPONINI <0.03    HbA1C: Hgb A1c MFr Bld  Date/Time Value Ref Range Status  11/08/2018 11:01 AM 6.1 (H) 4.8 - 5.6 % Final    Comment:    (NOTE) Pre diabetes:          5.7%-6.4% Diabetes:              >6.4% Glycemic control for   <7.0% adults with diabetes   10/25/2018 12:21 AM 5.4 4.8 - 5.6 % Final    Comment:    (NOTE) Pre diabetes:          5.7%-6.4% Diabetes:              >6.4% Glycemic control for   <7.0% adults with diabetes     CBG: Recent Labs  Lab 11/10/18 0749 11/10/18 1129 11/10/18 1657 11/10/18 2149  11/11/18 0813  GLUCAP 176* 201* 147* [place]     Baltazar Apo, MD, PhD 11/11/2018, 10:08 AM [facility] [phone] or if no answer [phone]

## 2018-11-11 NOTE — Care Management Note (Signed)
Case Management Note  Patient Details  Name: LYNIAH FUJITA MRN: [id number] Date of Birth: 1946/01/22  Subjective/Objective:                  Discharge Readiness Return to top of Chronic Obstructive Pulmonary Disease [facility] - [place]  Discharge readiness is indicated by patient meeting Recovery Milestones, including ALL of the following: ? Hemodynamic stability YES WEANED FROM BIPAP TO [place] ? Mental status at baseline YES ? No evidence of infection, or outpatient treatment planned WBC-WNL ? Eating and sleeping without frequent dyspnea HAS DYSPNEA WITH EXERTION ? Breathing comfortably at rest ON [place] ? Ambulatory IN ROOM AND UP IN THE CHAIR ? Oral hydration, medications, and diet ? REGULAR DIET, IV NS AT KVO ? LEVEL OF CARE SDU-MAY BE READY FOR TELE OR MED SURG BED   Action/Plan: Will follow for progression of care and clinical status. Will follow for case management needs none present at this time.  Expected Discharge Date:  (unknown)               Expected Discharge Plan:  [place]  In-House Referral:     Discharge planning Services  CM Consult  Post Acute Care Choice:    Choice offered to:  Patient  DME Arranged:    DME Agency:     HH Arranged:  (Patient Refused [place]) [place] Agency:     Status of Service:     If discussed at [place] of Stay Meetings, dates discussed:    Additional Comments:  Leeroy Cha, RN 11/11/2018, 10:08 AM

## 2018-11-11 NOTE — Progress Notes (Signed)
PROGRESS NOTE    Victoria Bird  GDJ:[id number] DOB: [birth year] DOA: 11/06/2018 PCP: Hoyt Koch, MD    Brief Narrative: Patient is a 73 year old female history of chronic respiratory failure on 2 L nasal cannula chronically secondary to severe COPD with very severe obstruction on spirometry of July 2019, chronic kidney disease stage III versus 4, former tobacco user quit in 2019, hypertension presented to the ED with worsening shortness of breath and admitted for an acute COPD exacerbation.  Patient on admission was placed in the stepdown unit and placed on BiPAP, IV steroids, scheduled nebulizer treatments.  Patient subsequently improved was taken off BiPAP and transferred to the telemetry floor.  Patient still with ongoing poor air movement.  On 11/29/2018 patient with worsening shortness of breath, use of accessory muscles of respiration, and subsequently transferred back to the stepdown unit to be placed on BiPAP.   Assessment & Plan:   Principal Problem:   Acute on chronic respiratory failure with hypoxia and hypercapnia ([name]) Active Problems:   Hypertension   GERD (gastroesophageal reflux disease)   OSA (obstructive sleep apnea)   Diabetes mellitus type 2, controlled ([name])   COPD exacerbation ([name])   Tobacco abuse   COPD with acute exacerbation ([name])   Chronic respiratory failure with hypoxia ([name])   Acute exacerbation of chronic obstructive pulmonary disease (COPD) ([name])   Malnutrition of moderate degree   ARF (acute renal failure) ([name])  1 acute on chronic respiratory failure with hypoxia and hypercapnia secondary to acute COPD exacerbation Patient initially admitted with acute on chronic respiratory failure felt to be secondary to COPD exacerbation requiring BiPAP initially and subsequently on BiPAP transferred to the floor.  Influenza PCR negative.  Respiratory viral panel positive for metapneumovirus which may have triggered patient's acute COPD exacerbation.  On  assessment on 11/09/2018, patient with increased use of accessory muscles of respiration, worsening shortness of breath, feeling of fatigue and tiredness, speaking in choppy sentences with poor air movement.  Patient with no significant improvement since transferred to the telemetry floor per patient.  ABG was ordered however discontinued per PCCM.  Patient transferred to the stepdown unit and placed on the BiPAP.  Patient was on BiPAP the night of 11/09/2018 and has been off BiPAP since morning of 11/10/2018.  Patient slowly clinically improving however still with some use of accessory muscles of respiration and some thoracoabdominal breathing.  Patient resistant to going back on BiPAP however states if she needs to will go on it at this time.  Continue Solu-Medrol 125 mg IV every 8 hours, scheduled duo nebs, Claritin, Protonix, Flonase, Levaquin, Brovana.  Change BiPAP to as needed.  If patient fails BiPAP and continues to worsen may need to be intubated.  PCCM has been consulted and are following.   2.  Gastroesophageal reflux disease Continue PPI.  3.  Acute on chronic kidney disease stage III Patient with a baseline creatinine from 2-2.5.  Creatinine was worsening and starting to trend back down after Foley catheter placement.  Urine output of 1.1 L over the past 24 hours.  Creatinine currently at 2.45 from 2.88 from 3.15 from 2.34 on 11/07/2018.  Repeat UA nitrite negative, leukocytes negative, protein negative.  Urine sodium was 15, urine creatinine of 124.04.  Patient noted to have some urinary retention throughout the course of the 11/08/2018.  Foley catheter ordered and placed.  Renal ultrasound with echogenic, thin renal parenchyma bilaterally.  No hydronephrosis or suspicious renal mass.  Follow.   4.  Moderate protein calorie malnutrition Continue nutritional supplementation.   5.  Hypertension Blood pressure somewhat borderline this morning.  Discontinue Norvasc.  Continue hydralazine and labetalol.   Patient also started on Flomax.  Diuretics on hold.   6.  Former tobacco use Patient congratulated on tobacco cessation February 2019.  Follow.  7.  Anemia of chronic disease H&H stable.  Follow.    8.  Diabetes mellitus type 2 Hemoglobin A1c 6.1 on 11/08/2018.  CBG of 188 this morning.  Continue Lantus 10 units daily and sliding scale insulin.   9.  urinary retention Foley catheter placed.  Output of 1.1 L over the past 24 hours.  Renal ultrasound negative for hydronephrosis or suspicious renal mass.  Continue Flomax.  Voiding trial in about  3-4 days.   DVT prophylaxis: Heparin Code Status: Full Family Communication: Updated patient.  No family at bedside. Disposition Plan: Remain in stepdown unit.   Consultants:   PCCM: Dr. Nelda Marseille 11/09/2018  Procedures:   Chest x-ray 11/08/2018, 11/06/2018  VQ scan 11/08/2018  Renal ultrasound 11/09/2018  2D echo 11/07/2018  Antimicrobials:  Oral Levaquin 2/7//2020>>>>> 11/12/2018  IV Levaquin 11/06/2018>>>> 11/08/2018   Subjective: Patient states has been off BiPAP since yesterday morning.  Patient reluctant to go back on the BiPAP.  Patient states she feels her breathing is better.  Speaking in less choppy sentences.  Less use of accessory muscles of respiration.  Denies chest pain.  No nausea or vomiting.  No abdominal pain.   Objective: Vitals:   11/11/18 0600 11/11/18 0800 11/11/18 0900 11/11/18 0901  BP: (!) 168/67 (!) 175/76 94/70   Pulse: 75 81 (!) 114   Resp: (!) 21 20 (!) 28   Temp:      TempSrc:      SpO2: 96% 97% 95% 98%  Weight:      Height:        Intake/Output Summary (Last 24 hours) at 11/11/2018 5701 Last data filed at 11/11/2018 0000 Gross per 24 hour  Intake 480 ml  Output 1100 ml  Net -620 ml   Filed Weights   11/07/18 1242 11/10/18 1137 11/11/18 0500  Weight: 73.2 kg 77.2 kg 70.9 kg    Examination:  General exam: Less visibly short of breath.  Some use of accessory muscles of respiration.  Thoracoabdominal  breathing.  Respiratory system: Thoracoabdominal breathing.  Less use of accessory muscles of respiration.  Decreased diffuse wheezing.  Poor to fair air movement.  Speaking in less choppy sentences.  No crackles.  Cardiovascular system: Regular rate rhythm no murmurs rubs or gallops.  No JVD.  No lower extremity edema. Gastrointestinal system: Abdomen is nontender, nondistended, soft, positive bowel sounds.  No rebound.  No guarding.   Central nervous system: Alert and oriented. No focal neurological deficits. Extremities: Symmetric 5 x 5 power. Skin: No rashes, lesions or ulcers Psychiatry: Judgement and insight appear normal. Mood & affect appropriate.     Data Reviewed: I have personally reviewed following labs and imaging studies  CBC: Recent Labs  Lab 11/06/18 1651 11/07/18 0452 11/09/18 0830 11/10/18 0409 11/11/18 0345  WBC 7.9 5.2 7.7 6.1 6.2  NEUTROABS 6.1 4.7 6.8 5.4 5.4  HGB 11.1* 10.3* 10.5* 10.1* 10.1*  HCT 37.7 33.2* 34.9* 32.8* 33.3*  MCV 92.0 89.0 88.6 89.4 90.5  PLT 159 146* 221 214 779   Basic Metabolic Panel: Recent Labs  Lab 11/06/18 1651 11/07/18 0557 11/09/18 0830 11/10/18 0409 11/11/18 0345  NA 141 139 138 139 140  K 4.7 5.1 4.9 5.0 5.0  CL 106 105 104 107 107  CO2 26 24 25 24 24   GLUCOSE 142* 127* 181* 174* 174*  BUN 31* 46* 85* 83* 74*  CREATININE 2.12* 2.34* 3.15* 2.88* 2.45*  CALCIUM 8.5* 8.5* 8.7* 8.9 8.9  MG  --   --  2.7*  --   --    GFR: Estimated Creatinine Clearance: 19.9 mL/min (A) (by C-G formula based on SCr of 2.45 mg/dL (H)). Liver Function Tests: Recent Labs  Lab 11/06/18 1651  AST 41  ALT 36  ALKPHOS 52  BILITOT 0.8  PROT 6.8  ALBUMIN 3.5   No results for input(s): LIPASE, AMYLASE in the last 168 hours. No results for input(s): AMMONIA in the last 168 hours. Coagulation Profile: No results for input(s): INR, PROTIME in the last 168 hours. Cardiac Enzymes: Recent Labs  Lab 11/06/18 1651  TROPONINI <0.03   BNP  (last 3 results) Recent Labs    04/10/18 1715  PROBNP 37.0   HbA1C: Recent Labs    11/08/18 1101  HGBA1C 6.1*   CBG: Recent Labs  Lab 11/10/18 0749 11/10/18 1129 11/10/18 1657 11/10/18 2149 11/11/18 0813  GLUCAP 176* 201* 147* 190* 188*   Lipid Profile: No results for input(s): CHOL, HDL, LDLCALC, TRIG, CHOLHDL, LDLDIRECT in the last 72 hours. Thyroid Function Tests: No results for input(s): TSH, T4TOTAL, FREET4, T3FREE, THYROIDAB in the last 72 hours. Anemia Panel: Recent Labs    11/10/18 0409  VITAMINB12 832  FOLATE 7.9  FERRITIN 204  TIBC 292  IRON 77   Sepsis Labs: No results for input(s): PROCALCITON, LATICACIDVEN in the last 168 hours.  Recent Results (from the past 240 hour(s))  Respiratory Panel by PCR     Status: Abnormal   Collection Time: 11/07/18  2:40 PM  Result Value Ref Range Status   Adenovirus NOT DETECTED NOT DETECTED Final   Coronavirus 229E NOT DETECTED NOT DETECTED Final    Comment: (NOTE) The Coronavirus on the Respiratory Panel, DOES NOT test for the novel  Coronavirus (2019 nCoV)    Coronavirus HKU1 NOT DETECTED NOT DETECTED Final   Coronavirus NL63 NOT DETECTED NOT DETECTED Final   Coronavirus OC43 NOT DETECTED NOT DETECTED Final   Metapneumovirus DETECTED (A) NOT DETECTED Final   Rhinovirus / Enterovirus NOT DETECTED NOT DETECTED Final   Influenza A NOT DETECTED NOT DETECTED Final   Influenza B NOT DETECTED NOT DETECTED Final   Parainfluenza Virus 1 NOT DETECTED NOT DETECTED Final   Parainfluenza Virus 2 NOT DETECTED NOT DETECTED Final   Parainfluenza Virus 3 NOT DETECTED NOT DETECTED Final   Parainfluenza Virus 4 NOT DETECTED NOT DETECTED Final   Respiratory Syncytial Virus NOT DETECTED NOT DETECTED Final   Bordetella pertussis NOT DETECTED NOT DETECTED Final   Chlamydophila pneumoniae NOT DETECTED NOT DETECTED Final   Mycoplasma pneumoniae NOT DETECTED NOT DETECTED Final    Comment: Performed at [facility], [address] [place], [place] [address]  MRSA PCR Screening     Status: None   Collection Time: 11/09/18  1:35 PM  Result Value Ref Range Status   MRSA by PCR NEGATIVE NEGATIVE Final    Comment:        The GeneXpert MRSA Assay (FDA approved for NASAL specimens only), is one component of a comprehensive MRSA colonization surveillance program. It is not intended to diagnose MRSA infection nor to guide or monitor treatment for MRSA infections. Performed at [facility], [place] [address], [place], [place] [address]          Radiology Studies: US Renal  Result Date: 11/09/2018 CLINICAL DATA:  Acute renal failure. EXAM: RENAL / URINARY TRACT ULTRASOUND COMPLETE COMPARISON:  CT of the abdomen and pelvis on 12/16/2016 FINDINGS: Right Kidney: Renal measurements: 8.4 x 3.9 x 5.6 centimeters = volume: 96.6 mL. Renal parenchyma is echogenic and thin. No suspicious mass. No hydronephrosis. Left Kidney: Renal measurements: 8.6 x 4.5 x 4.8 centimeters = volume: 98.6 mL. Echogenic renal parenchyma and thin. No suspicious renal mass or hydronephrosis. Bladder: Appears normal for degree of bladder distention. IMPRESSION: Echogenic, thin renal parenchyma bilaterally. No hydronephrosis or suspicious renal mass. Electronically Signed   By: Nolon Nations M.D.   On: 11/09/2018 12:49   Dg Chest Port 1 View  Result Date: 11/11/2018 CLINICAL DATA:  Shortness of breath, COPD, hypertension EXAM: PORTABLE CHEST 1 VIEW COMPARISON:  None. FINDINGS: The heart size and mediastinal contours are within normal limits. Both lungs are clear. The visualized skeletal structures are unremarkable. IMPRESSION: No acute abnormality of the lungs in AP portable projection. Electronically Signed   By: Eddie Candle M.D.   On: 11/11/2018 08:17   Dg Chest Port 1 View  Result Date: 11/09/2018 CLINICAL DATA:  73 year old female with history of coughing and shortness of breath. EXAM: PORTABLE CHEST 1 VIEW COMPARISON:  Chest  x-ray 11/08/2018. FINDINGS: Lung volumes are normal. No consolidative airspace disease. No pleural effusions. No pneumothorax. No pulmonary nodule or mass noted. Pulmonary vasculature and the cardiomediastinal silhouette are within normal limits. Aortic atherosclerosis. IMPRESSION: 1. No radiographic evidence of acute cardiopulmonary disease. 2. Aortic atherosclerosis. Electronically Signed   By: Vinnie Langton M.D.   On: 11/09/2018 13:54        Scheduled Meds: . arformoterol  15 mcg Nebulization BID  . feeding supplement (ENSURE ENLIVE)  237 mL Oral BID BM  . fluticasone  2 spray Each Nare Daily  . heparin  5,000 Units Subcutaneous Q8H  . hydrALAZINE  25 mg Oral Q8H  . insulin aspart  0-9 Units Subcutaneous TID WC  . insulin glargine  10 Units Subcutaneous Daily  . ipratropium-albuterol  3 mL Nebulization QID  . labetalol  100 mg Oral BID  . loratadine  10 mg Oral Daily  . methylPREDNISolone (SOLU-MEDROL) injection  125 mg Intravenous Q8H  . pantoprazole  40 mg Oral Daily  . sodium chloride flush  3 mL Intravenous Q12H  . tamsulosin  0.4 mg Oral QPC supper   Continuous Infusions:   LOS: 5 days    Time spent: 40 minutes    Irine Seal, MD [facility]  If 7PM-7AM, please contact night-coverage [email or web address] 11/11/2018, 9:22 AM

## 2018-11-11 NOTE — Progress Notes (Signed)
Physical Therapy Treatment Patient Details Name: Victoria Bird MRN: [id number] DOB: [birth year] Today's Date: 11/11/2018    History of Present Illness 73 year old female history of chronic respiratory failure on 2 L nasal cannula chronically secondary to severe COPD with very severe obstruction on spirometry of July 2019, chronic kidney disease stage III versus 4, former tobacco user quit in 2019, hypertension presented to the ED with worsening shortness of breath and admitted for an acute COPD exacerbation.  Patient on admission was placed in the stepdown unit and placed on BiPAP, IV steroids, scheduled nebulizer treatments.  Patient subsequently improved was taken off BiPAP and transferred to the telemetry floor.  Patient still with ongoing poor air movement.  On 11/09/2018 patient with worsening shortness of breath, use of accessory muscles of respiration, and subsequently transferred back to the stepdown unit to be placed on BiPAP. Pt with recent admission and discharge 1/23-1/27.     PT Comments    Pt cooperative but ltd by SOB with exertion.  This date pt ambulated very short distance to chair but desat to 87% on 4L - several minutes to recover to 95%.   Follow Up Recommendations  SNF     Equipment Recommendations  None recommended by PT    Recommendations for Other Services       Precautions / Restrictions Precautions Precautions: Fall Precaution Comments: monitor O2 sats, pt on 2L O2 at baseline Restrictions Weight Bearing Restrictions: No    Mobility  Bed Mobility Overal bed mobility: Needs Assistance Bed Mobility: Supine to Sit     Supine to sit: Min guard;HOB elevated     General bed mobility comments: Min guard for safety. Increased time and effort to perform. At start of session, pt satting 95% on 3LO2, but after sitting EOB pt dropped to 89%. Pt instructed in breathing techniques (in through nose, out through mouth), but pt tends to breathe in and out through  mouth even with cuing.   Transfers Overall transfer level: Needs assistance Equipment used: Rolling walker (2 wheeled) Transfers: Stand Pivot Transfers;Sit to/from Stand Sit to Stand: Min assist Stand pivot transfers: Min assist       General transfer comment: cues for use of UEs to self assist  Ambulation/Gait Ambulation/Gait assistance: Min assist;+2 physical assistance;+2 safety/equipment Gait Distance (Feet): 3 Feet Assistive device: Rolling walker (2 wheeled) Gait Pattern/deviations: Step-to pattern;Decreased step length - right;Decreased step length - left;Shuffle;Trunk flexed     General Gait Details: cues for posture and position from RW; increased time and rest break 2* SOB   Stairs             Wheelchair Mobility    Modified Rankin (Stroke Patients Only)       Balance Overall balance assessment: Needs assistance Sitting-balance support: Feet supported;Bilateral upper extremity supported Sitting balance-Leahy Scale: Fair     Standing balance support: Bilateral upper extremity supported Standing balance-Leahy Scale: Fair                              Cognition Arousal/Alertness: Awake/alert Behavior During Therapy: WFL for tasks assessed/performed;Anxious Overall Cognitive Status: Within Functional Limits for tasks assessed                                        Exercises      General Comments  Pertinent Vitals/Pain Pain Assessment: Faces Faces Pain Scale: Hurts a little bit Pain Location: low back Pain Descriptors / Indicators: Aching;Sore Pain Intervention(s): Limited activity within patient's tolerance;Monitored during session    Home Living                      Prior Function            PT Goals (current goals can now be found in the care plan section) Acute Rehab PT Goals Patient Stated Goal: breathe better  PT Goal Formulation: With patient Time For Goal Achievement:  11/23/18 Potential to Achieve Goals: Good Progress towards PT goals: Progressing toward goals    Frequency    Min 2X/week      PT Plan Current plan remains appropriate    Co-evaluation              AM-PAC PT "6 Clicks" Mobility   Outcome Measure  Help needed turning from your back to your side while in a flat bed without using bedrails?: A Little Help needed moving from lying on your back to sitting on the side of a flat bed without using bedrails?: A Little Help needed moving to and from a bed to a chair (including a wheelchair)?: A Lot Help needed standing up from a chair using your arms (e.g., wheelchair or bedside chair)?: A Little Help needed to walk in hospital room?: A Lot Help needed climbing 3-5 steps with a railing? : Total 6 Click Score: 14    End of Session Equipment Utilized During Treatment: Gait belt Activity Tolerance: Patient limited by fatigue Patient left: in chair;with call bell/phone within reach;with chair alarm set Nurse Communication: Mobility status PT Visit Diagnosis: Muscle weakness (generalized) (M62.81);Other abnormalities of gait and mobility (R26.89)     Time: 8177-1165 PT Time Calculation (min) (ACUTE ONLY): 23 min  Charges:  $Gait Training: 8-22 mins $Therapeutic Activity: 8-22 mins                     Debe Coder PT Acute Rehabilitation Services Pager [phone] Office [phone]    Victoria Bird 11/11/2018, 1:07 PM

## 2018-11-12 ENCOUNTER — Inpatient Hospital Stay (HOSPITAL_COMMUNITY): Payer: Medicare Other

## 2018-11-12 DIAGNOSIS — E875 Hyperkalemia: Secondary | ICD-10-CM | POA: Diagnosis not present

## 2018-11-12 DIAGNOSIS — R109 Unspecified abdominal pain: Secondary | ICD-10-CM | POA: Diagnosis not present

## 2018-11-12 LAB — POTASSIUM: Potassium: 5.6 mmol/L — ABNORMAL HIGH (ref 3.5–5.1)

## 2018-11-12 LAB — BASIC METABOLIC PANEL
Anion gap: 9 (ref 5–15)
Anion gap: 8 (ref 5–15)
BUN: 75 mg/dL — AB (ref 8–23)
BUN: 89 mg/dL — ABNORMAL HIGH (ref 8–23)
CO2: 25 mmol/L (ref 22–32)
CO2: 26 mmol/L (ref 22–32)
Calcium: 9.1 mg/dL (ref 8.9–10.3)
Calcium: 8.9 mg/dL (ref 8.9–10.3)
Chloride: 105 mmol/L (ref 98–111)
Chloride: 102 mmol/L (ref 98–111)
Creatinine, Ser: 2.19 mg/dL — ABNORMAL HIGH (ref 0.44–1.00)
Creatinine, Ser: 2.33 mg/dL — ABNORMAL HIGH (ref 0.44–1.00)
GFR calc Af Amer: 25 mL/min — ABNORMAL LOW (ref >60)
GFR calc Af Amer: 24 mL/min — ABNORMAL LOW (ref >60)
GFR calc non Af Amer: 22 mL/min — ABNORMAL LOW (ref >60)
GFR calc non Af Amer: 20 mL/min — ABNORMAL LOW (ref >60)
Glucose, Bld: 227 mg/dL — ABNORMAL HIGH (ref 70–99)
Glucose, Bld: 328 mg/dL — ABNORMAL HIGH (ref 70–99)
POTASSIUM: 5.4 mmol/L — AB (ref 3.5–5.1)
Potassium: 5.7 mmol/L — ABNORMAL HIGH (ref 3.5–5.1)
Sodium: 139 mmol/L (ref 135–145)
Sodium: 136 mmol/L (ref 135–145)

## 2018-11-12 LAB — GLUCOSE, CAPILLARY
GLUCOSE-CAPILLARY: 203 mg/dL — AB (ref 70–99)
Glucose-Capillary: 201 mg/dL — ABNORMAL HIGH (ref 70–99)
Glucose-Capillary: 309 mg/dL — ABNORMAL HIGH (ref 70–99)
Glucose-Capillary: 243 mg/dL — ABNORMAL HIGH (ref 70–99)
Glucose-Capillary: 131 mg/dL — ABNORMAL HIGH (ref 70–99)

## 2018-11-12 LAB — CBC
HCT: 35.2 % — ABNORMAL LOW (ref 36.0–46.0)
Hemoglobin: 10.7 g/dL — ABNORMAL LOW (ref 12.0–15.0)
MCH: 27 pg (ref 26.0–34.0)
MCHC: 30.4 g/dL (ref 30.0–36.0)
MCV: 88.7 fL (ref 80.0–100.0)
Platelets: 205 10*3/uL (ref 150–400)
RBC: 3.97 MIL/uL (ref 3.87–5.11)
RDW: 13.7 % (ref 11.5–15.5)
WBC: 7.6 10*3/uL (ref 4.0–10.5)
nRBC: 0 % (ref 0.0–0.2)

## 2018-11-12 LAB — MAGNESIUM: Magnesium: 2.4 mg/dL (ref 1.7–2.4)

## 2018-11-12 MED ORDER — FUROSEMIDE 10 MG/ML IJ SOLN: 40.0000 mg | Freq: Two times a day (BID) | INTRAMUSCULAR | Status: DC

## 2018-11-12 MED ORDER — FUROSEMIDE 10 MG/ML IJ SOLN
20.0000 mg | Freq: Two times a day (BID) | INTRAMUSCULAR | Status: AC
  Administered 2018-11-12 (×2): 20 mg via INTRAVENOUS
  Filled 2018-11-12 (×2): qty 2

## 2018-11-12 MED ORDER — ORAL CARE MOUTH RINSE
15.0000 mL | Freq: Two times a day (BID) | OROMUCOSAL | Status: DC
  Administered 2018-11-15 – 2018-11-24 (×9): 15 mL via OROMUCOSAL

## 2018-11-12 MED ORDER — HYDROCODONE-HOMATROPINE 5-1.5 MG/5ML PO SYRP
5.0000 mL | ORAL_SOLUTION | Freq: Four times a day (QID) | ORAL | Status: DC | PRN
  Filled 2018-11-12 (×2): qty 5

## 2018-11-12 MED ORDER — METHYLPREDNISOLONE SODIUM SUCC 125 MG IJ SOLR
60.0000 mg | Freq: Three times a day (TID) | INTRAMUSCULAR | Status: DC
  Administered 2018-11-12 – 2018-11-14 (×6): 60 mg via INTRAVENOUS
  Filled 2018-11-12 (×6): qty 2

## 2018-11-12 MED ORDER — BISACODYL 10 MG RE SUPP
10.0000 mg | Freq: Once | RECTAL | Status: AC
  Administered 2018-11-12: 10 mg via RECTAL
  Filled 2018-11-12: qty 1

## 2018-11-12 NOTE — Consult Note (Addendum)
Reason for Consult:  SBO Referring Physician: [name] is an 73 y.o. female.    HPI: Patient is a 73 year old female who was hospitalized 1/23 -10/28/18, with COPD exacerbation and community-acquired pneumonia.  She was discharged home and completed her antibiotics but returned on 11/06/2018 with increased fatigue, respiratory rate in the 30-40 range, and O2 desaturations down to 76% on oxygen.  She was readmitted with acute on chronic hypoxic respiratory failure.  She was placed on Levaquin and BiPAP.  She was seen on 11/09/2018, by Dr. Nel[facility] with a progressive decline.  She was having increased work of breathing and was unable to speak in full sentences.  She was transferred to the stepdown unit and placed on BiPAP again with plans to intubate if she had further respiratory deterioration.  Today she complained of some abdominal discomfort and there was concern for constipation.  She has not had a bowel movement in a week.  She is currently afebrile vital signs are stable although the blood pressure slightly elevated.  Labs show creatinine down to 2.19.  WBC is normal hemoglobin hematocrit are stable, potassium of 5.7.  2 view abdominal film shows multiple dilated small loops of small bowel in the mid abdomen moderate air in the stomach scattered air in the nondistended colon visible free air on the decubitus view.  Chest x-ray showed normal cardiac and pulmonary vasculature.  No infiltrates or effusions.  Lungs were hyperinflated consistent with her emphysema.  Distal issues include hypertension, type 2 diabetes, chronic kidney disease stage III-IV, history of constipation, Hx of deconditioning  Past Medical History:  Diagnosis Date  . Anxiety   . Chronic pain   . Colon polyps    adenomatous  . COPD (chronic obstructive pulmonary disease) ([place]) 61-pack-year history; quit 2019   . Diabetes mellitus   . Diverticulitis   . Diverticulosis   .  Emphysema lung ([place])   . Gall stones   . GERD (gastroesophageal reflux disease)   . H/O: pneumonia   . High cholesterol   . Hypertension   . OSA (obstructive sleep apnea)    no cpap  . Tracheobronchitis 01/01/2012    Past Surgical History:  Procedure Laterality Date  . ABDOMINAL HYSTERECTOMY    . APPENDECTOMY    . Bilateral shoulder surgery    . CARPAL TUNNEL RELEASE Left   . CATARACT EXTRACTION    . CHOLECYSTECTOMY    . COLONOSCOPY    . ESOPHAGOGASTRODUODENOSCOPY    . FRACTURE SURGERY    . Right knee surgery    . ROTATOR CUFF REPAIR      Family History  Problem Relation Age of Onset  . Heart attack Mother   . Diabetes Maternal Grandfather   . Heart attack Maternal Grandfather   . Hypertension Maternal Grandfather   . Stroke Maternal Grandfather   . Heart attack Maternal Grandmother   . Hypertension Maternal Grandmother   . Alcohol abuse Father   . Thyroid disease Neg Hx   . Lung disease Neg Hx   . Colon cancer Neg Hx   . Breast cancer Neg Hx     Social History:  reports that she quit smoking about a year ago. Her smoking use included cigarettes. She started smoking about 44 years ago. She has a 61.50 pack-year smoking history. She has never used smokeless tobacco. She reports that she does not drink alcohol or use drugs.  Allergies:  Allergies  Allergen Reactions  . Aspartame  And Phenylalanine Nausea And Vomiting    Patient says she is allergic to all artificial sweeteners  . Doxycycline Other (See Comments)    Nausea, vomiting, HA, double vision  . Tramadol Shortness Of Breath and Nausea Only    Can tolerate Morphine/Dilaudid IV, Percocet per pt report. 11/08/18.  Marland Kitchen Neurontin [Gabapentin] Other (See Comments)    Dizzy, "drugged" feeling, sleepy  . Sulfa Antibiotics Other (See Comments)    Kidney problem   . Symbicort [Budesonide-Formoterol Fumarate]     Swelling of face and inside of mouth per pt  . Penicillins Rash    Has patient had a PCN reaction causing  immediate rash, facial/tongue/throat swelling, SOB or lightheadedness with hypotension: No Has patient had a PCN reaction causing severe rash involving mucus membranes or skin necrosis: No Has patient had a PCN reaction that required hospitalization No Has patient had a PCN reaction occurring within the last 10 years: No If all of the above answers are "NO", then may proceed with Cephalosporin use.  . Prednisone Rash    Patient stated she received Prednisone while she was in the hospital and experienced a rash and "extreme pain.'    Medications:  Prior to Admission:  Medications Prior to Admission  Medication Sig Dispense Refill Last Dose  . albuterol (ACCUNEB) 0.63 MG/3ML nebulizer solution Take 1 ampule by nebulization every 6 (six) hours as needed for wheezing or shortness of breath.   unknown  . albuterol (PROAIR HFA) 108 (90 Base) MCG/ACT inhaler Inhale 2 puffs into the lungs every 6 (six) hours as needed for wheezing or shortness of breath. 1 Inhaler 5 unknown  . amLODipine (NORVASC) 10 MG tablet Take 1 tablet (10 mg total) by mouth daily. 90 tablet 3 11/06/2018 at Unknown time  . benzonatate (TESSALON PERLES) 100 MG capsule Take 1 capsule (100 mg total) by mouth every 6 (six) hours as needed for cough. 30 capsule 0 11/06/2018 at Unknown time  . cyclobenzaprine (FLEXERIL) 5 MG tablet TAKE 1 TABLET(5 MG) BY MOUTH THREE TIMES DAILY AS NEEDED FOR MUSCLE SPASMS (Patient taking differently: Take 5 mg by mouth 3 (three) times daily as needed for muscle spasms. ) 30 tablet 3 unknown  . furosemide (LASIX) 20 MG tablet Take 1 tablet (20 mg total) by mouth daily. (Patient taking differently: Take 20 mg by mouth as needed for fluid. ) 30 tablet 3 unknown  . guaifenesin (HUMIBID E) 400 MG TABS tablet Take 400 mg by mouth 2 (two) times daily.   11/06/2018 at Unknown time  . hydrALAZINE (APRESOLINE) 25 MG tablet Take 1 tablet (25 mg total) by mouth every 8 (eight) hours. 90 tablet 6 11/06/2018 at Unknown time   . labetalol (NORMODYNE) 100 MG tablet Take 1 tablet (100 mg total) by mouth 2 (two) times daily. 180 tablet 3 11/06/2018 at Unknown time  . pantoprazole (PROTONIX) 40 MG tablet TAKE 1 TABLET(40 MG) BY MOUTH DAILY (Patient taking differently: Take 40 mg by mouth daily. ) 90 tablet 3 11/06/2018 at Unknown time  . tiotropium (SPIRIVA HANDIHALER) 18 MCG inhalation capsule Place 1 capsule (18 mcg total) into inhaler and inhale daily. 30 capsule 5 unknown at Unknown time  . blood glucose meter kit and supplies KIT Use to test blood sugar 1 each 0 Taking  . furosemide (LASIX) 40 MG tablet Take 1 tablet (40 mg total) by mouth daily for 7 days. 7 tablet 0   . glucose blood (IGLUCOSE TEST STRIPS) test strip Use as instructed 100 each  0 Taking  . Lancets MISC Use to test blood sugar one daily 30 each 0 Taking   Scheduled: . feeding supplement (ENSURE ENLIVE)  237 mL Oral BID BM  . fluticasone  2 spray Each Nare Daily  . furosemide  20 mg Intravenous Q12H  . heparin  5,000 Units Subcutaneous Q8H  . hydrALAZINE  25 mg Oral Q8H  . insulin aspart  0-9 Units Subcutaneous TID WC  . insulin glargine  10 Units Subcutaneous Daily  . ipratropium-albuterol  3 mL Nebulization QID  . labetalol  100 mg Oral BID  . loratadine  10 mg Oral Daily  . methylPREDNISolone (SOLU-MEDROL) injection  60 mg Intravenous Q8H  . pantoprazole  40 mg Oral Daily  . sodium chloride flush  3 mL Intravenous Q12H  . tamsulosin  0.4 mg Oral QPC supper   Continuous:  Anti-infectives (From admission, onward)   Start     Dose/Rate Route Frequency Ordered Stop   11/10/18 1800  levofloxacin (LEVAQUIN) tablet 500 mg     500 mg Oral Every 48 hours 11/09/18 1457 11/10/18 2000   11/08/18 1800  levofloxacin (LEVAQUIN) tablet 750 mg  Status:  Discontinued     750 mg Oral Every 48 hours 11/08/18 0841 11/09/18 1457   11/06/18 1930  levofloxacin (LEVAQUIN) IVPB 750 mg  Status:  Discontinued     750 mg 100 mL/hr over 90 Minutes Intravenous Every  48 hours 11/06/18 1901 11/08/18 0841   11/06/18 1900  levofloxacin (LEVAQUIN) IVPB 750 mg  Status:  Discontinued     750 mg 100 mL/hr over 90 Minutes Intravenous Every 24 hours 11/06/18 1852 11/06/18 1900      Results for orders placed or performed during the hospital encounter of 11/06/18 (from the past 48 hour(s))  Glucose, capillary     Status: Abnormal   Collection Time: 11/10/18  4:57 PM  Result Value Ref Range   Glucose-Capillary 147 (H) 70 - 99 mg/dL  Glucose, capillary     Status: Abnormal   Collection Time: 11/10/18  9:49 PM  Result Value Ref Range   Glucose-Capillary 190 (H) 70 - 99 mg/dL   Comment 1 Notify RN    Comment 2 Document in Chart   CBC with Differential/Platelet     Status: Abnormal   Collection Time: 11/11/18  3:45 AM  Result Value Ref Range   WBC 6.2 4.0 - 10.5 K/uL   RBC 3.68 (L) 3.87 - 5.11 MIL/uL   Hemoglobin 10.1 (L) 12.0 - 15.0 g/dL   HCT 33.3 (L) 36.0 - 46.0 %   MCV 90.5 80.0 - 100.0 fL   MCH 27.4 26.0 - 34.0 pg   MCHC 30.3 30.0 - 36.0 g/dL   RDW 13.8 11.5 - 15.5 %   Platelets 190 150 - 400 K/uL   nRBC 0.0 0.0 - 0.2 %   Neutrophils Relative % 86 %   Neutro Abs 5.4 1.7 - 7.7 K/uL   Lymphocytes Relative 8 %   Lymphs Abs 0.5 (L) 0.7 - 4.0 K/uL   Monocytes Relative 4 %   Monocytes Absolute 0.2 0.1 - 1.0 K/uL   Eosinophils Relative 0 %   Eosinophils Absolute 0.0 0.0 - 0.5 K/uL   Basophils Relative 0 %   Basophils Absolute 0.0 0.0 - 0.1 K/uL   Immature Granulocytes 2 %   Abs Immature Granulocytes 0.10 (H) 0.00 - 0.07 K/uL    Comment: Performed at [facility], [place] [address], [place], [place] [address]  Basic metabolic panel     Status: Abnormal   Collection Time: 11/11/18  3:45 AM  Result Value Ref Range   Sodium 140 135 - 145 mmol/L   Potassium 5.0 3.5 - 5.1 mmol/L   Chloride 107 98 - 111 mmol/L   CO2 24 22 - 32 mmol/L   Glucose, Bld 174 (H) 70 - 99 mg/dL   BUN 74 (H) 8 - 23 mg/dL   Creatinine, Ser 2.45 (H) 0.44 - 1.00  mg/dL   Calcium 8.9 8.9 - 10.3 mg/dL   GFR calc non Af Amer 19 (L) >60 mL/min   GFR calc Af Amer 22 (L) >60 mL/min   Anion gap 9 5 - 15    Comment: Performed at [facility], [place] [address] [place], [place] [address]  Glucose, capillary     Status: Abnormal   Collection Time: 11/11/18  8:13 AM  Result Value Ref Range   Glucose-Capillary 188 (H) 70 - 99 mg/dL   Comment 1 Notify RN    Comment 2 Document in Chart   Glucose, capillary     Status: Abnormal   Collection Time: 11/11/18 12:42 PM  Result Value Ref Range   Glucose-Capillary 305 (H) 70 - 99 mg/dL   Comment 1 Notify RN    Comment 2 Document in Chart   Glucose, capillary     Status: Abnormal   Collection Time: 11/11/18  5:19 PM  Result Value Ref Range   Glucose-Capillary 201 (H) 70 - 99 mg/dL  Glucose, capillary     Status: Abnormal   Collection Time: 11/11/18  9:40 PM  Result Value Ref Range   Glucose-Capillary 219 (H) 70 - 99 mg/dL  Glucose, capillary     Status: Abnormal   Collection Time: 11/11/18 11:28 PM  Result Value Ref Range   Glucose-Capillary 203 (H) 70 - 99 mg/dL   Comment 1 Notify RN    Comment 2 Document in Chart   CBC     Status: Abnormal   Collection Time: 11/12/18  3:22 AM  Result Value Ref Range   WBC 7.6 4.0 - 10.5 K/uL   RBC 3.97 3.87 - 5.11 MIL/uL   Hemoglobin 10.7 (L) 12.0 - 15.0 g/dL   HCT 35.2 (L) 36.0 - 46.0 %   MCV 88.7 80.0 - 100.0 fL   MCH 27.0 26.0 - 34.0 pg   MCHC 30.4 30.0 - 36.0 g/dL   RDW 13.7 11.5 - 15.5 %   Platelets 205 150 - 400 K/uL   nRBC 0.0 0.0 - 0.2 %    Comment: Performed at [facility], [place] [address], [place], [place] [address]  Basic metabolic panel     Status: Abnormal   Collection Time: 11/12/18  3:22 AM  Result Value Ref Range   Sodium 139 135 - 145 mmol/L   Potassium 5.7 (H) 3.5 - 5.1 mmol/L   Chloride 105 98 - 111 mmol/L   CO2 25 22 - 32 mmol/L   Glucose, Bld 227 (H) 70 - 99 mg/dL   BUN 75 (H) 8 - 23 mg/dL    Creatinine, Ser 2.19 (H) 0.44 - 1.00 mg/dL   Calcium 9.1 8.9 - 10.3 mg/dL   GFR calc non Af Amer 22 (L) >60 mL/min   GFR calc Af Amer 25 (L) >60 mL/min   Anion gap 9 5 - 15    Comment: Performed at [facility], [place] [address] [place], [place] [address]  Glucose, capillary  Status: Abnormal   Collection Time: 11/12/18  7:39 AM  Result Value Ref Range   Glucose-Capillary 201 (H) 70 - 99 mg/dL  Potassium     Status: Abnormal   Collection Time: 11/12/18  7:55 AM  Result Value Ref Range   Potassium 5.6 (H) 3.5 - 5.1 mmol/L    Comment: NO VISIBLE HEMOLYSIS Performed at [facility], [place] [address], [place], [place] [address]   Glucose, capillary     Status: Abnormal   Collection Time: 11/12/18 12:04 PM  Result Value Ref Range   Glucose-Capillary 309 (H) 70 - 99 mg/dL    Dg Chest 1 View  Result Date: 11/12/2018 CLINICAL DATA:  Shortness of breath.  Chronic respiratory failure. EXAM: CHEST  1 VIEW COMPARISON:  Chest x-rays dated 11/11/2018, 11/09/2018 11/08/2018 and 10/24/2018 FINDINGS: Heart size and pulmonary vascularity are normal. No infiltrates or effusions. The lungs are hyperinflated consistent with previously demonstrated emphysema. Slight dilatation and calcification of the arch of the aorta. No significant change since the prior exams. IMPRESSION: Aortic Atherosclerosis (ICD10-I70.0) and Emphysema (ICD10-J43.9). No acute abnormalities. Electronically Signed   By: Lorriane Shire M.D.   On: 11/12/2018 11:35   Dg Chest Port 1 View  Result Date: 11/11/2018 CLINICAL DATA:  Shortness of breath, COPD, hypertension EXAM: PORTABLE CHEST 1 VIEW COMPARISON:  None. FINDINGS: The heart size and mediastinal contours are within normal limits. Both lungs are clear. The visualized skeletal structures are unremarkable. IMPRESSION: No acute abnormality of the lungs in AP portable projection. Electronically Signed   By: Eddie Candle M.D.   On: 11/11/2018 08:17    Dg Abd 2 Views  Result Date: 11/12/2018 CLINICAL DATA:  Abdominal pain and distention. EXAM: ABDOMEN - 2 VIEW COMPARISON:  Radiographs dated 03/19/2017 FINDINGS: There are multiple dilated loops of small bowel in the mid abdomen. Moderate air in the stomach. Scattered air in the nondistended colon. No visible free air on the decubitus view. No bone abnormality. IMPRESSION: Multiple dilated loops of small bowel which could represent small bowel obstruction. The colon is not distended. Electronically Signed   By: Lorriane Shire M.D.   On: 11/12/2018 11:43    Review of Systems  Constitutional: Negative.   HENT: Negative.   Eyes: Negative.   Respiratory: Positive for sputum production, shortness of breath and wheezing.   Cardiovascular: Positive for orthopnea, leg swelling and PND.  Gastrointestinal: Positive for abdominal pain (distension) and constipation (denies constipation, had diarrhea prior to admit, but says it was not preceeded by constipaton.). Negative for blood in stool, diarrhea, heartburn, melena, nausea and vomiting.  Genitourinary:       Foley in place  Musculoskeletal:       Sore from being in bed  Skin: Negative.   Neurological: Negative.   Endo/Heme/Allergies: Negative.   Psychiatric/Behavioral: Negative.    Blood pressure (!) 150/72, pulse 93, temperature 98.1 F (36.7 C), temperature source Oral, resp. rate (!) 22, height '5\' 3"'  (1.6 m), weight 71.5 kg, SpO2 95 %. Physical Exam  Constitutional: She is oriented to person, place, and time.  Ill appearing woman, but in no acute distress  HENT:  Head: Normocephalic and atraumatic.  Mouth/Throat: Oropharynx is clear and moist. No oropharyngeal exudate.  Eyes: Right eye exhibits no discharge. Left eye exhibits no discharge. No scleral icterus.  Pupils are equal  Neck: Normal range of motion. Neck supple. No tracheal deviation present. No thyromegaly present.  Cardiovascular: Normal rate, regular rhythm, normal heart  sounds and intact  distal pulses.  No murmur heard. Respiratory: Effort normal. No respiratory distress. She has wheezes. She has no rales. She exhibits no tenderness.  RUQ scar from cholecystectomy  GI: Bowel sounds are normal. She exhibits distension. She exhibits no mass. There is no abdominal tenderness. There is no rebound and no guarding.  BS are high pitched, hypoactive. She says her abdomen is less distended, drinking a coke without issue, no nausea or vomiting  Musculoskeletal:        General: No tenderness or edema.  Neurological: She is alert and oriented to person, place, and time. No cranial nerve deficit.  Skin: Skin is warm and dry. No rash noted. No erythema. No pallor.  Psychiatric: She has a normal mood and affect. Her behavior is normal. Judgment and thought content normal.    Assessment/Plan: Ileus vs SBO - last BM 11/07/18  - Hx abdominal hysterectomy, appendectomy, cholecystectomy Acute on chronic Respiratory failure - metapneumovirus positive on admit Severe COPD/tobacco use, quit last year- Home O2 Hx OSA Type II diabetes Hypertension Malnutrition GERD Anemia of chronic disease Urinary retention  Plan:  I think it is more likely she has some ileus, rather than an SBO.  On my exam she says she feels much better than this AM.  No prior hx of SBO, she denies constipation hx.  She has been in bed/hospital since January.  She is not having nausea or vomiting, and she is drinking a cup of coke as I was seeing her.    I agree with NPO, sips and chips for oral comfort.  Start with a dulcolax suppository and see how she does, repeat film in AM.  If no better we can get a CT without contrast. If she develops nausea or emesis she will need an NG tube with decompression.  I think trying to put an NG down her would increase the risk of aspiration with vomiting. She says she is fine now, not able to walk and just wants her Coke. I would manage conservatively since she seems to be  in no distress at this time and is feeling better.  See how she progresses.        Victoria Bird 11/12/2018, 1:10 PM

## 2018-11-12 NOTE — Progress Notes (Addendum)
PROGRESS NOTE    Victoria Bird  QIO:[id number] DOB: [birth year] DOA: 11/06/2018 PCP: Hoyt Koch, MD    Brief Narrative: Patient is a 73 year old female history of chronic respiratory failure on 2 L nasal cannula chronically secondary to severe COPD with very severe obstruction on spirometry of July 2019, chronic kidney disease stage III versus 4, former tobacco user quit in 2019, hypertension presented to the ED with worsening shortness of breath and admitted for an acute COPD exacerbation.  Patient on admission was placed in the stepdown unit and placed on BiPAP, IV steroids, scheduled nebulizer treatments.  Patient subsequently improved was taken off BiPAP and transferred to the telemetry floor.  Patient still with ongoing poor air movement.  On 11/29/2018 patient with worsening shortness of breath, use of accessory muscles of respiration, and subsequently transferred back to the stepdown unit to be placed on BiPAP.   Assessment & Plan:   Principal Problem:   Acute on chronic respiratory failure with hypoxia and hypercapnia ([name]) Active Problems:   Hypertension   GERD (gastroesophageal reflux disease)   OSA (obstructive sleep apnea)   Diabetes mellitus type 2, controlled ([name])   COPD exacerbation ([name])   Tobacco abuse   COPD with acute exacerbation ([name])   Chronic respiratory failure with hypoxia ([name])   Acute exacerbation of chronic obstructive pulmonary disease (COPD) ([name])   Malnutrition of moderate degree   ARF (acute renal failure) ([name])   Hyperkalemia   Abdominal discomfort  1 acute on chronic respiratory failure with hypoxia and hypercapnia secondary to acute COPD exacerbation Patient initially admitted with acute on chronic respiratory failure felt to be secondary to COPD exacerbation requiring BiPAP initially and subsequently on BiPAP transferred to the floor.  Influenza PCR negative.  Respiratory viral panel positive for metapneumovirus which may have triggered  patient's acute COPD exacerbation.  On assessment on 11/09/2018, patient with increased use of accessory muscles of respiration, worsening shortness of breath, feeling of fatigue and tiredness, speaking in choppy sentences with poor air movement.  Patient with no significant improvement since transferred to the telemetry floor per patient.  ABG was ordered however discontinued per PCCM.  Patient transferred to the stepdown unit and placed on the BiPAP.  Patient was on BiPAP the night of 11/09/2018 and has been off BiPAP since morning of 11/10/2018.  Patient slowly clinically improving with less use of accessory muscles of respiration and less thoracoabdominal breathing. Patient resistant to going back on BiPAP however states if she needs to will go on it at this time.  Decrease Solu-Medrol to 60 mg IV every 8 hours.  Likely transition to high-dose oral prednisone with slow taper tomorrow 11/13/2018.  Continue scheduled duo nebs, Claritin, Protonix, Flonase, Levaquin.  Garlon Hatchet has been discontinued.  Patient with some crackles noted on examination and as such we will give Lasix 20 mg IV every 12 hours x2 doses.  Repeat chest x-ray.  BiPAP to as needed.  If patient fails BiPAP and continues to worsen may need to be intubated.  PCCM was consulted and followed the patient.  PCCM will see as needed.  2.  Gastroesophageal reflux disease Continue PPI.  3.  Acute on chronic kidney disease stage III Patient with a baseline creatinine from 2-2.5.  Creatinine was worsening and starting to trend back down after Foley catheter placement.  Urine output of 825 cc over the past 24 hours.  Creatinine currently at 2.19 from 2.45 from 2.88 from 3.15 from 2.34 on 11/07/2018.  Repeat UA  nitrite negative, leukocytes negative, protein negative.  Urine sodium was 15, urine creatinine of 124.04.  Patient noted to have some urinary retention throughout the course of the 11/08/2018.  Foley catheter ordered and placed.  Renal ultrasound with  echogenic, thin renal parenchyma bilaterally.  No hydronephrosis or suspicious renal mass.  Patient with some crackles noted on clinical examination and as such we will give 2 doses of Lasix 20 IV every 12 hours.  Follow.   4.  Moderate protein calorie malnutrition Continue nutritional supplementation.   5.  Hypertension Blood pressure elevated with systolic blood pressure of 170 this morning.  Continue hydralazine and labetalol.  Norvasc was discontinued.  Patient started on Flomax.  Patient diuretics have been on hold.  Place on Lasix 20 mg IV every 12 hours x2 doses.  Follow.  6.  Former tobacco use Patient congratulated on tobacco cessation February 2019.  Follow.  7.  Anemia of chronic disease H&H stable.  Follow.    8.  Diabetes mellitus type 2 Hemoglobin A1c 6.1 on 11/08/2018.  CBG of 201 this morning.  Continue Lantus 10 units daily and sliding scale insulin.   9.  urinary retention Foley catheter placed.  Output of 825 cc over the past 24 hours. Renal ultrasound negative for hydronephrosis or suspicious renal mass.  Continue Flomax.  Voiding trial tomorrow.     10.  Hyperkalemia Repeat potassium at 5.6.  Patient will be given IV Lasix.  Patient to get a soapsuds enema.  Repeat labs this afternoon.  If potassium is still elevated will give Kayexalate.  11.  Abdominal discomfort Concern for possible constipation.  Check abdominal films.  Patient's last bowel movement was on 11/07/2018.  Soapsuds enema.   DVT prophylaxis: Heparin Code Status: Full Family Communication: Updated patient.  No family at bedside. Disposition Plan: Remain in stepdown unit.   Consultants:   PCCM: Dr. Nelda Marseille 11/09/2018  Procedures:   Chest x-ray 11/08/2018, 11/06/2018  VQ scan 11/08/2018  Renal ultrasound 11/09/2018  2D echo 11/07/2018  Antimicrobials:  Oral Levaquin 2/7//2020>>>>> 11/11/2018  IV Levaquin 11/06/2018>>>> 11/08/2018   Subjective: Patient sleeping.  Still off BiPAP.  States breathing has  improved.  States is just not feeling too well today.  Last bowel movement was on 11/07/2018.  Patient with some complaints of nausea.  Denies any chest pain.  Complains of some abdominal discomfort.   Objective: Vitals:   11/12/18 0545 11/12/18 0600 11/12/18 0636 11/12/18 0705  BP: (!) 168/75 (!) 159/104    Pulse:  82    Resp:  (!) 23    Temp:    98.1 F (36.7 C)  TempSrc:    Oral  SpO2:  96% 97%   Weight:      Height:        Intake/Output Summary (Last 24 hours) at 11/12/2018 0921 Last data filed at 11/11/2018 2000 Gross per 24 hour  Intake -  Output 700 ml  Net -700 ml   Filed Weights   11/10/18 1137 11/11/18 0500 11/12/18 0500  Weight: 77.2 kg 70.9 kg 71.5 kg    Examination:  General exam: Less visibly short of breath.  Less use of accessory muscles of respiration.  Less thoracoabdominal breathing.  Respiratory system: Less use of accessory muscles of respiration.  Large thoracoabdominal breathing.  Fair air movement.  No wheezing.  Diffuse crackles noted.  Speaking in less choppy sentences.   Cardiovascular system: RRR no murmurs rubs or gallops.  No JVD.  No lower extremity edema.  Gastrointestinal system: Abdomen is mildly distended, soft, diffusely uncomfortable to palpation, positive bowel sounds.  No rebound.  No guarding. Central nervous system: Alert and oriented. No focal neurological deficits. Extremities: Symmetric 5 x 5 power. Skin: No rashes, lesions or ulcers Psychiatry: Judgement and insight appear normal. Mood & affect appropriate.     Data Reviewed: I have personally reviewed following labs and imaging studies  CBC: Recent Labs  Lab 11/06/18 1651 11/07/18 0452 11/09/18 0830 11/10/18 0409 11/11/18 0345 11/12/18 0322  WBC 7.9 5.2 7.7 6.1 6.2 7.6  NEUTROABS 6.1 4.7 6.8 5.4 5.4  --   HGB 11.1* 10.3* 10.5* 10.1* 10.1* 10.7*  HCT 37.7 33.2* 34.9* 32.8* 33.3* 35.2*  MCV 92.0 89.0 88.6 89.4 90.5 88.7  PLT 159 146* 221 214 190 160   Basic Metabolic  Panel: Recent Labs  Lab 11/07/18 0557 11/09/18 0830 11/10/18 0409 11/11/18 0345 11/12/18 0322 11/12/18 0755  NA 139 138 139 140 139  --   K 5.1 4.9 5.0 5.0 5.7* 5.6*  CL 105 104 107 107 105  --   CO2 24 25 24 24 25   --   GLUCOSE 127* 181* 174* 174* 227*  --   BUN 46* 85* 83* 74* 75*  --   CREATININE 2.34* 3.15* 2.88* 2.45* 2.19*  --   CALCIUM 8.5* 8.7* 8.9 8.9 9.1  --   MG  --  2.7*  --   --   --   --    GFR: Estimated Creatinine Clearance: 22.3 mL/min (A) (by C-G formula based on SCr of 2.19 mg/dL (H)). Liver Function Tests: Recent Labs  Lab 11/06/18 1651  AST 41  ALT 36  ALKPHOS 52  BILITOT 0.8  PROT 6.8  ALBUMIN 3.5   No results for input(s): LIPASE, AMYLASE in the last 168 hours. No results for input(s): AMMONIA in the last 168 hours. Coagulation Profile: No results for input(s): INR, PROTIME in the last 168 hours. Cardiac Enzymes: Recent Labs  Lab 11/06/18 1651  TROPONINI <0.03   BNP (last 3 results) Recent Labs    04/10/18 1715  PROBNP 37.0   HbA1C: No results for input(s): HGBA1C in the last 72 hours. CBG: Recent Labs  Lab 11/11/18 1242 11/11/18 1719 11/11/18 2140 11/11/18 2328 11/12/18 0739  GLUCAP 305* 201* 219* 203* 201*   Lipid Profile: No results for input(s): CHOL, HDL, LDLCALC, TRIG, CHOLHDL, LDLDIRECT in the last 72 hours. Thyroid Function Tests: No results for input(s): TSH, T4TOTAL, FREET4, T3FREE, THYROIDAB in the last 72 hours. Anemia Panel: Recent Labs    11/10/18 0409  VITAMINB12 832  FOLATE 7.9  FERRITIN 204  TIBC 292  IRON 77   Sepsis Labs: No results for input(s): PROCALCITON, LATICACIDVEN in the last 168 hours.  Recent Results (from the past 240 hour(s))  Respiratory Panel by PCR     Status: Abnormal   Collection Time: 11/07/18  2:40 PM  Result Value Ref Range Status   Adenovirus NOT DETECTED NOT DETECTED Final   Coronavirus 229E NOT DETECTED NOT DETECTED Final    Comment: (NOTE) The Coronavirus on the  Respiratory Panel, DOES NOT test for the novel  Coronavirus (2019 nCoV)    Coronavirus HKU1 NOT DETECTED NOT DETECTED Final   Coronavirus NL63 NOT DETECTED NOT DETECTED Final   Coronavirus OC43 NOT DETECTED NOT DETECTED Final   Metapneumovirus DETECTED (A) NOT DETECTED Final   Rhinovirus / Enterovirus NOT DETECTED NOT DETECTED Final   Influenza A NOT DETECTED NOT DETECTED Final  Influenza B NOT DETECTED NOT DETECTED Final   Parainfluenza Virus 1 NOT DETECTED NOT DETECTED Final   Parainfluenza Virus 2 NOT DETECTED NOT DETECTED Final   Parainfluenza Virus 3 NOT DETECTED NOT DETECTED Final   Parainfluenza Virus 4 NOT DETECTED NOT DETECTED Final   Respiratory Syncytial Virus NOT DETECTED NOT DETECTED Final   Bordetella pertussis NOT DETECTED NOT DETECTED Final   Chlamydophila pneumoniae NOT DETECTED NOT DETECTED Final   Mycoplasma pneumoniae NOT DETECTED NOT DETECTED Final    Comment: Performed at [facility], [place] [address] [place], [place] [address]  MRSA PCR Screening     Status: None   Collection Time: 11/09/18  1:35 PM  Result Value Ref Range Status   MRSA by PCR NEGATIVE NEGATIVE Final    Comment:        The GeneXpert MRSA Assay (FDA approved for NASAL specimens only), is one component of a comprehensive MRSA colonization surveillance program. It is not intended to diagnose MRSA infection nor to guide or monitor treatment for MRSA infections. Performed at [facility], [place] [address], [place], [place] [address]          Radiology Studies: Dg Chest [facility] 1 View  Result Date: 11/11/2018 CLINICAL DATA:  Shortness of breath, COPD, hypertension EXAM: PORTABLE CHEST 1 VIEW COMPARISON:  None. FINDINGS: The heart size and mediastinal contours are within normal limits. Both lungs are clear. The visualized skeletal structures are unremarkable. IMPRESSION: No acute abnormality of the lungs in AP portable projection. Electronically Signed   By: Eddie Candle M.D.   On: 11/11/2018 08:17        Scheduled Meds: . feeding supplement (ENSURE ENLIVE)  237 mL Oral BID BM  . fluticasone  2 spray Each Nare Daily  . furosemide  20 mg Intravenous Q12H  . heparin  5,000 Units Subcutaneous Q8H  . hydrALAZINE  25 mg Oral Q8H  . insulin aspart  0-9 Units Subcutaneous TID WC  . insulin glargine  10 Units Subcutaneous Daily  . ipratropium-albuterol  3 mL Nebulization QID  . labetalol  100 mg Oral BID  . loratadine  10 mg Oral Daily  . methylPREDNISolone (SOLU-MEDROL) injection  125 mg Intravenous Q8H  . pantoprazole  40 mg Oral Daily  . sodium chloride flush  3 mL Intravenous Q12H  . tamsulosin  0.4 mg Oral QPC supper   Continuous Infusions:   LOS: 6 days    Time spent: 40 minutes    Irine Seal, MD [facility]  If 7PM-7AM, please contact night-coverage [email or web address] 11/12/2018, 9:21 AM

## 2018-11-12 NOTE — Progress Notes (Signed)
  Speech Language Pathology Treatment: Dysphagia  Patient Details Name: Victoria Bird MRN: [id number] DOB: Jun 04, 1946 Today's Date: 11/12/2018 Time: 1200-1210 SLP Time Calculation (min) (ACUTE ONLY): 10 min  Assessment / Plan / Recommendation Clinical Impression  Pt familiar to this SlP from 2017 visit.  Today reviewed option of pursuing MBS with pt due to concerns for possible aspiration, even silent aspiration given her COPD.  Pt states she "had all that swallowing evaluation before" and she declined instrumental evaluation *MBS.  She denies difficulty swallowing at this time.  MBS would not be conducted today given pt lack of BM since Feb 2nd* and distended abdomen.    Informed pt to increased pulmonary risk if she is chronically having low grade aspiration.  Advised her to follow reflux/esophageal precautions. She states "I am not on any reflux medications, you can check if you want to".  Chart indicates she is on protonix - advised her to as such.  SLP further recommend she sit fully upright during and after meals as note she required verbal cues/prompting to allow proper posture.    Noted pt diet order changed to NPO x medicine before SLP observed pt with solids - however pt declines further assessment regardless and denies dysphagia/reflux.  All education completed and unless pt desires MBS - no follow up needed.  Thanks.     HPI HPI: 73 year old female history of chronic respiratory failure secondary to severe COPD with very severe obstruction on spirometry of July 2019, chronic kidney disease stage,  former tobacco user, GERD, DM, pna, HTN presented to the ED with worsening shortness of breath. Found to have acute COPD exacerbation, on BiPAP improved moved to stepdown however increased respiratory distress and transferred to ICU.  Per chart. Pt with recent admission and discharge 1/23-1/27. BSE 03/14/16 (and 2013) during hospitalization when she choked on piece of chicken. No s/s  aspiration. Pt has history of GERD and SLP educated pt re: reflux strategies.       SLP Plan  Discharge SLP treatment due to (comment)(pt declined MBS)       Recommendations  Diet recommendations: Regular;Thin liquid Liquids provided via: Cup;Straw Medication Administration: Whole meds with puree Supervision: Patient able to self feed Compensations: Slow rate;Small sips/bites Postural Changes and/or Swallow Maneuvers: Seated upright 90 degrees;Upright 30-60 min after meal                Oral Care Recommendations: Oral care BID Follow up Recommendations: (TBD) SLP Visit Diagnosis: Dysphagia, unspecified (R13.10) Plan: Discharge SLP treatment due to (comment)(pt declined MBS)       GO                [name] 11/12/2018, 2:18 PM  [name], [facility] SLP [place] Pager [phone] Office [phone]

## 2018-11-12 NOTE — Progress Notes (Signed)
Patient currently NPO per MD's orders. There is a concern patient may have a SBO vs.ileus per Will J. PA notes. Patient is frustrated and unhappy with the NPO order. Explained to patient why this order is necessary until the possible SBO vs. Ileus is resolved. Patient stated that "she will leave AMA when daughter arrives tonight." Explained to patient that drinking and eating could make diagnosis worse at this time.Tried to call daughter to explain situation but no answer via phone but left voice message.

## 2018-11-12 NOTE — Plan of Care (Signed)
  Problem: Nutrition: Goal: Adequate nutrition will be maintained Outcome: Progressing   Problem: Pain Managment: Goal: General experience of comfort will improve Outcome: Progressing   Problem: Safety: Goal: Ability to remain free from injury will improve Outcome: Progressing   

## 2018-11-12 NOTE — Progress Notes (Signed)
OT Cancellation Note  Patient Details Name: Victoria Bird MRN: [id number] DOB: 12-Mar-1946   Cancelled Treatment:    Reason Eval/Treat Not Completed: Medical issues which prohibited therapy;Patient declined, no reason specified; pt declining working with OT, reports too fatigued and upset re: current NPO status, also noted pt with elevated potassium (5.6). Will follow up for OT treatment as able.  Lou Cal, OT [facility] Pager [phone] Office [phone]   Raymondo Band 11/12/2018, 2:27 PM

## 2018-11-12 NOTE — Progress Notes (Signed)
Pt currently on 3 LPM [place] and tolerating well at this time.  Pt appears to be in no acute distress at this time, Bipap not indicated.  RT to monitor and assess as needed.

## 2018-11-13 ENCOUNTER — Inpatient Hospital Stay (HOSPITAL_COMMUNITY): Payer: Medicare Other

## 2018-11-13 DIAGNOSIS — E440 Moderate protein-calorie malnutrition: Secondary | ICD-10-CM

## 2018-11-13 DIAGNOSIS — G4733 Obstructive sleep apnea (adult) (pediatric): Secondary | ICD-10-CM

## 2018-11-13 LAB — CBC
HCT: 36.7 % (ref 36.0–46.0)
HEMOGLOBIN: 11.2 g/dL — AB (ref 12.0–15.0)
MCH: 26.5 pg (ref 26.0–34.0)
MCHC: 30.5 g/dL (ref 30.0–36.0)
MCV: 87 fL (ref 80.0–100.0)
Platelets: 202 10*3/uL (ref 150–400)
RBC: 4.22 MIL/uL (ref 3.87–5.11)
RDW: 13.8 % (ref 11.5–15.5)
WBC: 7.7 10*3/uL (ref 4.0–10.5)
nRBC: 0 % (ref 0.0–0.2)

## 2018-11-13 LAB — GLUCOSE, CAPILLARY
Glucose-Capillary: 156 mg/dL — ABNORMAL HIGH (ref 70–99)
Glucose-Capillary: 208 mg/dL — ABNORMAL HIGH (ref 70–99)
Glucose-Capillary: 230 mg/dL — ABNORMAL HIGH (ref 70–99)
Glucose-Capillary: 254 mg/dL — ABNORMAL HIGH (ref 70–99)

## 2018-11-13 LAB — BASIC METABOLIC PANEL
Anion gap: 10 (ref 5–15)
BUN: 84 mg/dL — ABNORMAL HIGH (ref 8–23)
CO2: 26 mmol/L (ref 22–32)
Calcium: 8.9 mg/dL (ref 8.9–10.3)
Chloride: 103 mmol/L (ref 98–111)
Creatinine, Ser: 2.3 mg/dL — ABNORMAL HIGH (ref 0.44–1.00)
GFR calc Af Amer: 24 mL/min — ABNORMAL LOW (ref >60)
GFR calc non Af Amer: 21 mL/min — ABNORMAL LOW (ref >60)
Glucose, Bld: 192 mg/dL — ABNORMAL HIGH (ref 70–99)
POTASSIUM: 5 mmol/L (ref 3.5–5.1)
SODIUM: 139 mmol/L (ref 135–145)

## 2018-11-13 MED ORDER — BISACODYL 10 MG RE SUPP
10.0000 mg | Freq: Once | RECTAL | Status: DC
  Filled 2018-11-13: qty 1

## 2018-11-13 MED ORDER — IPRATROPIUM-ALBUTEROL 0.5-2.5 (3) MG/3ML IN SOLN
3.0000 mL | Freq: Three times a day (TID) | RESPIRATORY_TRACT | Status: DC
  Administered 2018-11-13 – 2018-11-16 (×4): 3 mL via RESPIRATORY_TRACT
  Filled 2018-11-13 (×9): qty 3

## 2018-11-13 NOTE — Progress Notes (Signed)
PT Cancellation Note  Patient Details Name: KAYLINN DEDIC MRN: [id number] DOB: [birth year]   Cancelled Treatment:    Reason Eval/Treat Not Completed: Patient declined, patient declined, stating that she sat too long yesterday and is too tired today. Claretha Cooper 11/13/2018, 2:59 PM [place] Pager [phone] Office [phone]

## 2018-11-13 NOTE — Progress Notes (Signed)
[facility] Progress Note     Subjective: CC: wants to drink Denies abdominal pain or nausea. Passed some flatus last night, none yet today. No BM. Denies feeling bloated.  Tolerating some sips of liquids and ensure.  Reports difficulty with mobilization secondary to weakness and joint pain. Discussed importance of mobilization for building strength and bowel function.   Objective: Vital signs in last 24 hours: Temp:  [97.8 F (36.6 C)-98.3 F (36.8 C)] 97.9 F (36.6 C) (02/12 0402) Pulse Rate:  [77-97] 83 (02/12 0400) Resp:  [16-27] 16 (02/12 0400) BP: (109-176)/(59-98) 109/88 (02/12 0400) SpO2:  [86 %-98 %] 98 % (02/12 0728) Last BM Date: 11/07/18  Intake/Output from previous day: 02/11 0701 - 02/12 0700 In: 3 [I.V.:3] Out: 2350 [Urine:2350] Intake/Output this shift: No intake/output data recorded.  PE: Gen:  Alert, NAD Card:  Regular rate and rhythm Pulm:  On nasal cannula  Abd: Soft, non-tender, moderately distended, bowel sounds hypoactive Skin: warm and dry, no rashes  Psych: A&Ox3   Lab Results:  Recent Labs    11/12/18 0322 11/13/18 0245  WBC 7.6 7.7  HGB 10.7* 11.2*  HCT 35.2* 36.7  PLT 205 202   BMET Recent Labs    11/12/18 1351 11/13/18 0245  NA 136 139  K 5.4* 5.0  CL 102 103  CO2 26 26  GLUCOSE 328* 192*  BUN 89* 84*  CREATININE 2.33* 2.30*  CALCIUM 8.9 8.9   PT/INR No results for input(s): LABPROT, INR in the last 72 hours. CMP     Component Value Date/Time   NA 139 11/13/2018 0245   K 5.0 11/13/2018 0245   CL 103 11/13/2018 0245   CO2 26 11/13/2018 0245   GLUCOSE 192 (H) 11/13/2018 0245   BUN 84 (H) 11/13/2018 0245   CREATININE 2.30 (H) 11/13/2018 0245   CALCIUM 8.9 11/13/2018 0245   PROT 6.8 11/06/2018 1651   ALBUMIN 3.5 11/06/2018 1651   AST 41 11/06/2018 1651   ALT 36 11/06/2018 1651   ALKPHOS 52 11/06/2018 1651   BILITOT 0.8 11/06/2018 1651   GFRNONAA 21 (L) 11/13/2018 0245   GFRAA 24 (L) 11/13/2018 0245    Lipase     Component Value Date/Time   LIPASE 32.0 07/29/2018 1430       Studies/Results: Dg Chest 1 View  Result Date: 11/12/2018 CLINICAL DATA:  Shortness of breath.  Chronic respiratory failure. EXAM: CHEST  1 VIEW COMPARISON:  Chest x-rays dated 11/11/2018, 11/09/2018 11/08/2018 and 10/24/2018 FINDINGS: Heart size and pulmonary vascularity are normal. No infiltrates or effusions. The lungs are hyperinflated consistent with previously demonstrated emphysema. Slight dilatation and calcification of the arch of the aorta. No significant change since the prior exams. IMPRESSION: Aortic Atherosclerosis (ICD10-I70.0) and Emphysema (ICD10-J43.9). No acute abnormalities. Electronically Signed   By: Lorriane Shire M.D.   On: 11/12/2018 11:35   Dg Abd 1 View  Result Date: 11/13/2018 CLINICAL DATA:  Ileus EXAM: ABDOMEN - 1 VIEW COMPARISON:  Yesterday FINDINGS: Unchanged generalized gaseous small bowel distention. Gas is seen within the colon, reaching the sigmoid colon and rectum on prior. Moderate gastric distention. No evidence of pneumatosis. No concerning gas collection. Cholecystectomy clips. IMPRESSION: History of ileus with unchanged gaseous bowel distension. Electronically Signed   By: Monte Fantasia M.D.   On: 11/13/2018 08:27   Dg Abd 2 Views  Result Date: 11/12/2018 CLINICAL DATA:  Abdominal pain and distention. EXAM: ABDOMEN - 2 VIEW COMPARISON:  Radiographs dated 03/19/2017 FINDINGS: There are multiple  dilated loops of small bowel in the mid abdomen. Moderate air in the stomach. Scattered air in the nondistended colon. No visible free air on the decubitus view. No bone abnormality. IMPRESSION: Multiple dilated loops of small bowel which could represent small bowel obstruction. The colon is not distended. Electronically Signed   By: Lorriane Shire M.D.   On: 11/12/2018 11:43    Anti-infectives: Anti-infectives (From admission, onward)   Start     Dose/Rate Route Frequency Ordered  Stop   11/10/18 1800  levofloxacin (LEVAQUIN) tablet 500 mg     500 mg Oral Every 48 hours 11/09/18 1457 11/10/18 2000   11/08/18 1800  levofloxacin (LEVAQUIN) tablet 750 mg  Status:  Discontinued     750 mg Oral Every 48 hours 11/08/18 0841 11/09/18 1457   11/06/18 1930  levofloxacin (LEVAQUIN) IVPB 750 mg  Status:  Discontinued     750 mg 100 mL/hr over 90 Minutes Intravenous Every 48 hours 11/06/18 1901 11/08/18 0841   11/06/18 1900  levofloxacin (LEVAQUIN) IVPB 750 mg  Status:  Discontinued     750 mg 100 mL/hr over 90 Minutes Intravenous Every 24 hours 11/06/18 1852 11/06/18 1900       Assessment/Plan Acute on chronic Respiratory failure - metapneumovirus positive on admit Severe COPD/tobacco use, quit last year- Home O2 Hx OSA Type II diabetes Hypertension Malnutrition GERD Anemia of chronic disease Urinary retention  Ileus vs SBO - Hx of abdominal hysterectomy, appendectomy, cholecystectomy - MOBILIZE!!!! - repeat dulcolax suppository today - will discuss with MD today whether patient could try PO gastrografin - if she becomes nauseated or vomits would recommend NGT placement - recommend bowel rest - patient taking sips of liquids   FEN: sips of liquids VTE: SCDs, SQ heparin  ID: levaquin 2/5>2/9   LOS: 7 days    Brigid Re , [name] 11/13/2018, 9:09 AM Pager: [place]: [phone]

## 2018-11-13 NOTE — Progress Notes (Signed)
LCSW consulted for SNF placement.  Patient declined SNF.  LCSW attempted to reach daughter for collateral.  LCSW signing off.  Victoria Bird [place]

## 2018-11-13 NOTE — Care Management Note (Signed)
Case Management Note  Patient Details  Name: Victoria Bird MRN: [id number] Date of Birth: 1946/02/17  Subjective/Objective:                  Discharge Readiness Return to top of Pulmonary Embolism RRG - [place]  Discharge readiness is indicated by patient meeting Recovery Milestones, including ALL of the following: ? Hemodynamic stability YES ? No bleeding or evidence of new emboli POSSIBLE PULMIONARY INFARCT ? Ambulatory NO ? Oxygen absent[Y] NO ON O2 VIANC ? INR therapeutic if indicated N/A ? Oral hydration, medications,[Z] and diet ? IV SOLU-MEDROL NEB. TREATMENTS ? LEVEL OF CARE-SDU   Action/Plan: Will follow for progression of care and clinical status. Will follow for case management needs none present at this time.  Expected Discharge Date:  (unknown)               Expected Discharge Plan:  [name]  In-House Referral:     Discharge planning Services  CM Consult  Post Acute Care Choice:    Choice offered to:  Patient  DME Arranged:    DME Agency:     HH Arranged:  (Patient Refused [place]) [place] Agency:     Status of Service:     If discussed at [name] of Stay Meetings, dates discussed:    Additional Comments:  Leeroy Cha, RN 11/13/2018, 11:23 AM

## 2018-11-13 NOTE — Clinical Social Work Note (Signed)
Clinical Social Work Assessment  Patient Details  Name: Victoria Bird MRN: [id number] Date of Birth: [birth year]  Date of referral:  11/13/18               Reason for consult:  Facility Placement                Permission sought to share information with:  Family Supports Permission granted to share information::     Name::     Teacher, adult education::     Relationship::  dtr  Contact Information:     Housing/Transportation Living arrangements for the past 2 months:  Mobile Home Source of Information:  Patient Patient Interpreter Needed:  None Criminal Activity/Legal Involvement Pertinent to Current Situation/Hospitalization:  No - Comment as needed Significant Relationships:  Adult Children Lives with:  Adult Children Do you feel safe going back to the place where you live?  Yes Need for family participation in patient care:  Yes (Comment)  Care giving concerns:  73 year old woman with medical problems including COPD (very severe obstruction on spirometry in 04/2018), chronic kidney disease stage III vs IV, former smoker, and chronic hypoxic respiratory failure with a baseline 2 L oxygen requirement, and hypertension presenting with shortness of breath.  History is obtained via patient, discussion with the emergency medicine team, and chart review.  Patients daughter has concerns about patients ability to care for herself at home. LCSW called daughter for collateral with no success.   Social Worker assessment / plan:  LCSW consulted for SNF placement.   LCSW met at bedside with patient. Patient declines SNF and Home Health. Patient states she lives in a trailer and does not need rehab because she doesn't have a large house. Patient reports she is on 2L of oxygen at home and uses a walker to assist with ambulation. According to patient she is independent in all ADLs.   According to PT/OT patient is now refusing to participate.   PLAN: TBD, patient declining CSW services at the time  of assessment. LCSW signing off.   Employment status:  Retired Forensic scientist:    PT Recommendations:  [place] / Referral to community resources:     Patient/Family's Response to care:  Patient does not feel she needs rehab at home or inpatient.   Patient/Family's Understanding of and Emotional Response to Diagnosis, Current Treatment, and Prognosis:  Patient feels that she can manage at home. Patient reports she lives with her son and adopted daughter that help at home. LCSW called daughter and left message to address concerns she mentioned to staff.   Emotional Assessment Appearance:  Appears stated age Attitude/Demeanor/Rapport:  Engaged Affect (typically observed):  Calm Orientation:  Oriented to Self, Oriented to Place, Oriented to  Time, Oriented to Situation Alcohol / Substance use:  Tobacco Use Psych involvement (Current and /or in the community):  No (Comment)  Discharge Needs  Concerns to be addressed:  No discharge needs identified Readmission within the last 30 days:  Yes Current discharge risk:  None Barriers to Discharge:  Continued Medical Work up   [employer], LCSW 11/13/2018, 3:28 PM

## 2018-11-13 NOTE — Progress Notes (Signed)
PROGRESS NOTE  Victoria Bird PFX:[id number] DOB: 10-Nov-1945 DOA: 11/06/2018 PCP: Hoyt Koch, MD   LOS: 7 days   Brief narrative: Patient is a 73 year old female history of chronic respiratory failure on 2 L nasal cannula chronically secondary to severe COPD with very severe obstruction on spirometry of July 2019, chronic kidney disease stage III versus 4, former tobacco user quit in 2019, hypertension presented to the ED with worsening shortness of breath and was admitted for an acute COPD exacerbation.  Patient on admission was placed in the stepdown unit and placed on BiPAP, IV steroids, scheduled nebulizer treatments.  Patient subsequently improved was taken off BiPAP and transferred to the telemetry floor.  Patient still with ongoing poor air movement.  On 11/29/2018 patient with worsening shortness of breath, use of accessory muscles of respiration, and subsequently transferred back to the stepdown unit to be placed on BiPAP. During stepdown stay, patient did have constipation and ileus.  Surgery was consulted.  Assessment/Plan:  Principal Problem:   Acute on chronic respiratory failure with hypoxia and hypercapnia ([name]) Active Problems:   Hypertension   GERD (gastroesophageal reflux disease)   OSA (obstructive sleep apnea)   Diabetes mellitus type 2, controlled ([name])   COPD exacerbation ([name])   Tobacco abuse   COPD with acute exacerbation ([name])   Chronic respiratory failure with hypoxia ([name])   Acute exacerbation of chronic obstructive pulmonary disease (COPD) ([name])   Malnutrition of moderate degree   ARF (acute renal failure) ([name])   Hyperkalemia   Abdominal discomfort  Acute on chronic hypoxic, hypercapnic respiratory failure secondary to COPD exacerbation.  Respiratory viral panel was positive for metapneumovirus.  Will closely monitor and stepdown unit.  PRN BiPAP.  Continue on oxygen nebulizer steroids.  Received Lasix as well. PCCM on board.  Abdominal  distention, constipation.  Surgery on board.  Patient has not had a bowel movement yet.  Did have some flatus yesterday.  Tolerating some liquids.  Plan is to repeat and Dulcolax suppository today.  History of GERD.  Continue PPI.  Acute kidney injury on chronic kidney disease stage III.  Will closely monitor.  On a Foley catheter.  Renal ultrasound showed echogenic kidneys.  No hydronephrosis.    Moderate protein calorie malnutrition Continue nutritional supplementation.  Hypertension Continue hydralazine labetalol.  On Flomax.  Diuretics on hold.  Still accelerated.     Anemia of chronic disease Stable at this time.  Closely monitor CBC.  Diabetes mellitus type 2 Hemoglobin A1c 6.1 on 11/08/2018.  On Lantus and sliding scale insulin.  Will closely monitor.  Glycemic control better today.  Urinary retention On a Foley catheter.  Flomax was initiated.  Kidney ultrasound with echogenic kidneys.  Will consider voiding trial in a.m.   Hyperkalemia Improved potassium levels at this time.  Will closely monitor.    VTE Prophylaxis: Heparin subcu  Code Status: Full code  Family Communication: No one available at bedside  Disposition Plan: Likely skilled nursing facility in 2 to 3 days.  Follow surgical recommendation.  Physical therapy recommended skilled nursing facility placement.  Consultants:  Surgery, PCCM  Procedures:  Chest x-ray 11/08/2018, 11/06/2018  VQ scan 11/08/2018  Renal ultrasound 11/09/2018  2D echo 11/07/2018   Antibiotics: Anti-infectives (From admission, onward)   Start     Dose/Rate Route Frequency Ordered Stop   11/10/18 1800  levofloxacin (LEVAQUIN) tablet 500 mg     500 mg Oral Every 48 hours 11/09/18 1457 11/10/18 2000   11/08/18 1800  levofloxacin (LEVAQUIN) tablet 750 mg  Status:  Discontinued     750 mg Oral Every 48 hours 11/08/18 0841 11/09/18 1457   11/06/18 1930  levofloxacin (LEVAQUIN) IVPB 750 mg  Status:  Discontinued     750  mg 100 mL/hr over 90 Minutes Intravenous Every 48 hours 11/06/18 1901 11/08/18 0841   11/06/18 1900  levofloxacin (LEVAQUIN) IVPB 750 mg  Status:  Discontinued     750 mg 100 mL/hr over 90 Minutes Intravenous Every 24 hours 11/06/18 1852 11/06/18 1900       Subjective: Denies any abdominal pain or vomiting but has mild distention pain has not moved her bowels.  She did have some flatus yesterday.  Objective: Vitals:   11/13/18 0402 11/13/18 0728  BP:    Pulse:    Resp:    Temp: 97.9 F (36.6 C)   SpO2:  98%    Intake/Output Summary (Last 24 hours) at 11/13/2018 0736 Last data filed at 11/13/2018 0700 Gross per 24 hour  Intake 3 ml  Output 2350 ml  Net -2347 ml   Filed Weights   11/10/18 1137 11/11/18 0500 11/12/18 0500  Weight: 77.2 kg 70.9 kg 71.5 kg   Body mass index is 27.92 kg/m.   Physical Exam: GENERAL: Patient is alert awake and oriented. Not in obvious distress.   on nasal cannula HENT: No scleral pallor or icterus. Pupils equally reactive to light. Oral mucosa is moist NECK: is supple, no palpable thyroid enlargement. CHEST: Diminished breath sounds were noted bilaterally with coarse breath sounds. CVS: S1 and S2 heard, no murmur. Regular rate and rhythm. No pericardial rub. ABDOMEN: Soft, non-tender, bowel sounds are present.  Abdominal distention present. No palpable hepato-splenomegaly. EXTREMITIES: Trace edema CNS: Cranial nerves are intact. No focal motor or sensory deficits. SKIN: warm and dry without rashes.  Data Review: I have personally reviewed the following laboratory data and studies,  CBC: Recent Labs  Lab 11/06/18 1651 11/07/18 0452 11/09/18 0830 11/10/18 0409 11/11/18 0345 11/12/18 0322 11/13/18 0245  WBC 7.9 5.2 7.7 6.1 6.2 7.6 7.7  NEUTROABS 6.1 4.7 6.8 5.4 5.4  --   --   HGB 11.1* 10.3* 10.5* 10.1* 10.1* 10.7* 11.2*  HCT 37.7 33.2* 34.9* 32.8* 33.3* 35.2* 36.7  MCV 92.0 89.0 88.6 89.4 90.5 88.7 87.0  PLT 159 146* 221 214 190  205 026   Basic Metabolic Panel: Recent Labs  Lab 11/09/18 0830 11/10/18 0409 11/11/18 0345 11/12/18 0322 11/12/18 0755 11/12/18 1351 11/13/18 0245  NA 138 139 140 139  --  136 139  K 4.9 5.0 5.0 5.7* 5.6* 5.4* 5.0  CL 104 107 107 105  --  102 103  CO2 25 24 24 25   --  26 26  GLUCOSE 181* 174* 174* 227*  --  328* 192*  BUN 85* 83* 74* 75*  --  89* 84*  CREATININE 3.15* 2.88* 2.45* 2.19*  --  2.33* 2.30*  CALCIUM 8.7* 8.9 8.9 9.1  --  8.9 8.9  MG 2.7*  --   --   --   --  2.4  --    Liver Function Tests: Recent Labs  Lab 11/06/18 1651  AST 41  ALT 36  ALKPHOS 52  BILITOT 0.8  PROT 6.8  ALBUMIN 3.5   No results for input(s): LIPASE, AMYLASE in the last 168 hours. No results for input(s): AMMONIA in the last 168 hours. Cardiac Enzymes: Recent Labs  Lab 11/06/18 1651  TROPONINI <0.03   BNP (  last 3 results) Recent Labs    10/24/18 1912  BNP 45.6    ProBNP (last 3 results) Recent Labs    04/10/18 1715  PROBNP 37.0    CBG: Recent Labs  Lab 11/12/18 0739 11/12/18 1204 11/12/18 1535 11/12/18 1626 11/12/18 2150  GLUCAP 201* 309* 243* 203* 131*   Recent Results (from the past 240 hour(s))  Respiratory Panel by PCR     Status: Abnormal   Collection Time: 11/07/18  2:40 PM  Result Value Ref Range Status   Adenovirus NOT DETECTED NOT DETECTED Final   Coronavirus 229E NOT DETECTED NOT DETECTED Final    Comment: (NOTE) The Coronavirus on the Respiratory Panel, DOES NOT test for the novel  Coronavirus (2019 nCoV)    Coronavirus HKU1 NOT DETECTED NOT DETECTED Final   Coronavirus NL63 NOT DETECTED NOT DETECTED Final   Coronavirus OC43 NOT DETECTED NOT DETECTED Final   Metapneumovirus DETECTED (A) NOT DETECTED Final   Rhinovirus / Enterovirus NOT DETECTED NOT DETECTED Final   Influenza A NOT DETECTED NOT DETECTED Final   Influenza B NOT DETECTED NOT DETECTED Final   Parainfluenza Virus 1 NOT DETECTED NOT DETECTED Final   Parainfluenza Virus 2 NOT DETECTED  NOT DETECTED Final   Parainfluenza Virus 3 NOT DETECTED NOT DETECTED Final   Parainfluenza Virus 4 NOT DETECTED NOT DETECTED Final   Respiratory Syncytial Virus NOT DETECTED NOT DETECTED Final   Bordetella pertussis NOT DETECTED NOT DETECTED Final   Chlamydophila pneumoniae NOT DETECTED NOT DETECTED Final   Mycoplasma pneumoniae NOT DETECTED NOT DETECTED Final    Comment: Performed at [facility], [place] [address] [place], [place] [address]  MRSA PCR Screening     Status: None   Collection Time: 11/09/18  1:35 PM  Result Value Ref Range Status   MRSA by PCR NEGATIVE NEGATIVE Final    Comment:        The GeneXpert MRSA Assay (FDA approved for NASAL specimens only), is one component of a comprehensive MRSA colonization surveillance program. It is not intended to diagnose MRSA infection nor to guide or monitor treatment for MRSA infections. Performed at [facility], [place] [address] [place],  [address]      Studies: Dg Chest 1 View  Result Date: 11/12/2018 CLINICAL DATA:  Shortness of breath.  Chronic respiratory failure. EXAM: CHEST  1 VIEW COMPARISON:  Chest x-rays dated 11/11/2018, 11/09/2018 11/08/2018 and 10/24/2018 FINDINGS: Heart size and pulmonary vascularity are normal. No infiltrates or effusions. The lungs are hyperinflated consistent with previously demonstrated emphysema. Slight dilatation and calcification of the arch of the aorta. No significant change since the prior exams. IMPRESSION: Aortic Atherosclerosis (ICD10-I70.0) and Emphysema (ICD10-J43.9). No acute abnormalities. Electronically Signed   By: Lorriane Shire M.D.   On: 11/12/2018 11:35   Dg Chest Port 1 View  Result Date: 11/11/2018 CLINICAL DATA:  Shortness of breath, COPD, hypertension EXAM: PORTABLE CHEST 1 VIEW COMPARISON:  None. FINDINGS: The heart size and mediastinal contours are within normal limits. Both lungs are clear. The visualized skeletal structures are unremarkable.  IMPRESSION: No acute abnormality of the lungs in AP portable projection. Electronically Signed   By: Eddie Candle M.D.   On: 11/11/2018 08:17   Dg Abd 2 Views  Result Date: 11/12/2018 CLINICAL DATA:  Abdominal pain and distention. EXAM: ABDOMEN - 2 VIEW COMPARISON:  Radiographs dated 03/19/2017 FINDINGS: There are multiple dilated loops of small bowel in the mid abdomen. Moderate air in the stomach. Scattered  air in the nondistended colon. No visible free air on the decubitus view. No bone abnormality. IMPRESSION: Multiple dilated loops of small bowel which could represent small bowel obstruction. The colon is not distended. Electronically Signed   By: Lorriane Shire M.D.   On: 11/12/2018 11:43    Scheduled Meds: . feeding supplement (ENSURE ENLIVE)  237 mL Oral BID BM  . fluticasone  2 spray Each Nare Daily  . heparin  5,000 Units Subcutaneous Q8H  . hydrALAZINE  25 mg Oral Q8H  . insulin aspart  0-9 Units Subcutaneous TID WC  . insulin glargine  10 Units Subcutaneous Daily  . ipratropium-albuterol  3 mL Nebulization QID  . labetalol  100 mg Oral BID  . loratadine  10 mg Oral Daily  . mouth rinse  15 mL Mouth Rinse BID  . methylPREDNISolone (SOLU-MEDROL) injection  60 mg Intravenous Q8H  . pantoprazole  40 mg Oral Daily  . sodium chloride flush  3 mL Intravenous Q12H  . tamsulosin  0.4 mg Oral QPC supper    Continuous Infusions:   Flora Lipps, MD  [facility] 11/13/2018

## 2018-11-13 NOTE — Progress Notes (Signed)
Nutrition Follow-up  DOCUMENTATION CODES:   Non-severe (moderate) malnutrition in context of chronic illness  INTERVENTION:  - Diet advancement as medically feasible. - Continue Ensure Enlive BID.   NUTRITION DIAGNOSIS:   Moderate Malnutrition related to chronic illness(COPD) as evidenced by percent weight loss, energy intake < 75% for > or equal to 1 month, moderate muscle depletion, severe muscle depletion. -ongoing  GOAL:   Patient will meet greater than or equal to 90% of their needs -unable to meet at this time  MONITOR:   Diet advancement, PO intake, Supplement acceptance, Weight trends, Labs  ASSESSMENT:   Patient with PMH significant for COPD, CKD III, and HTN. Recently admitted January 23-January 27 where she was treated for COPD exacerbation in the setting of PNA. Presents this admission with acute exacerbation of COPD.   Weight has fluctuated throughout hospitalization. Patient's last recorded meal was 100% of breakfast on 2/9 on regular diet. She was changed to NPO yesterday at 11:55 AM. Ensure Enlive had been ordered on 2/7 and patient accepted 4 of 8 bottles offered before being changed to NPO.   Patient reports having sips of ice water and Ensure today. She denies any abdominal pain/pressure or nausea with or without sips today. She is very frustrated about being NPO and wanting to know when diet will be advanced. Claiborne Billings, Surgery PA, was able to talk with patient this AM about this topic.   Per Kelly's note this AM: ileus vs SBO with plan for repeat dulcolax suppository today, NGT will need to be placed if pt becomes nauseated or vomits, and plan for ongoing bowel rest at this time.    Medications reviewed; 20 mg IV lasix x2 doses 2/11, sliding scale novolog, 10 units lantus/day, 60 mg solu-medrol TID, 40 mg oral protonix/day.  Labs reviewed; CBGs: 156 and 208 mg/dl today, BUN: 84 mg/dl, creatinine: 2.3 mg/dl, GFR: 21 ml/min.       Diet Order:   Diet Order           Diet NPO time specified Except for: Ice Chips, Sips with Meds  Diet effective now              EDUCATION NEEDS:   Education needs have been addressed  Skin:  Skin Assessment: Skin Integrity Issues: Skin Integrity Issues:: Other (Comment) Other: MASD- bilateral perineum  Last BM:  2/6  Height:   Ht Readings from Last 1 Encounters:  11/07/18 5\' 3"  (1.6 m)    Weight:   Wt Readings from Last 1 Encounters:  11/12/18 71.5 kg    Ideal Body Weight:  52.3 kg  BMI:  Body mass index is 27.92 kg/m.  Estimated Nutritional Needs:   Kcal:  1650-1850 kcal  Protein:  80-95 grams  Fluid:  >/= 1.6 L/day     Jarome Matin, MS, RD, LDN, [facility] Inpatient Clinical Dietitian Pager # [phone] After hours/weekend pager # [phone]

## 2018-11-13 NOTE — Progress Notes (Signed)
OT Cancellation Note  Patient Details Name: Victoria Bird MRN: [id number] DOB: [birth year]   Cancelled Treatment:    Reason Eval/Treat Not Completed: Fatigue/lethargy limiting ability to participate.  Pt declined all OOB activity despite encouragement. Will check another day.  [name] 11/13/2018, 2:59 PM  Lesle Chris, OTR/L Acute Rehabilitation Services [phone] [facility] pager [phone] office 11/13/2018

## 2018-11-14 ENCOUNTER — Inpatient Hospital Stay (HOSPITAL_COMMUNITY): Payer: Medicare Other

## 2018-11-14 LAB — BASIC METABOLIC PANEL
ANION GAP: 12 (ref 5–15)
Anion gap: 10 (ref 5–15)
BUN: 81 mg/dL — ABNORMAL HIGH (ref 8–23)
BUN: 82 mg/dL — ABNORMAL HIGH (ref 8–23)
CO2: 26 mmol/L (ref 22–32)
CO2: 27 mmol/L (ref 22–32)
Calcium: 8.8 mg/dL — ABNORMAL LOW (ref 8.9–10.3)
Calcium: 9 mg/dL (ref 8.9–10.3)
Chloride: 102 mmol/L (ref 98–111)
Chloride: 98 mmol/L (ref 98–111)
Creatinine, Ser: 2.16 mg/dL — ABNORMAL HIGH (ref 0.44–1.00)
Creatinine, Ser: 2.32 mg/dL — ABNORMAL HIGH (ref 0.44–1.00)
GFR calc Af Amer: 26 mL/min — ABNORMAL LOW (ref >60)
GFR calc Af Amer: 24 mL/min — ABNORMAL LOW (ref >60)
GFR calc non Af Amer: 20 mL/min — ABNORMAL LOW (ref >60)
GFR, EST NON AFRICAN AMERICAN: 22 mL/min — AB (ref >60)
Glucose, Bld: 209 mg/dL — ABNORMAL HIGH (ref 70–99)
Glucose, Bld: 210 mg/dL — ABNORMAL HIGH (ref 70–99)
POTASSIUM: 5.2 mmol/L — AB (ref 3.5–5.1)
Potassium: 5.5 mmol/L — ABNORMAL HIGH (ref 3.5–5.1)
Sodium: 138 mmol/L (ref 135–145)
Sodium: 137 mmol/L (ref 135–145)

## 2018-11-14 LAB — CBC
HCT: 37.3 % (ref 36.0–46.0)
HCT: 38.3 % (ref 36.0–46.0)
HEMOGLOBIN: 11.8 g/dL — AB (ref 12.0–15.0)
Hemoglobin: 11.6 g/dL — ABNORMAL LOW (ref 12.0–15.0)
MCH: 27.8 pg (ref 26.0–34.0)
MCH: 26.8 pg (ref 26.0–34.0)
MCHC: 31.1 g/dL (ref 30.0–36.0)
MCHC: 30.8 g/dL (ref 30.0–36.0)
MCV: 89.2 fL (ref 80.0–100.0)
MCV: 87 fL (ref 80.0–100.0)
NRBC: 0 % (ref 0.0–0.2)
Platelets: 188 10*3/uL (ref 150–400)
Platelets: 209 10*3/uL (ref 150–400)
RBC: 4.18 MIL/uL (ref 3.87–5.11)
RBC: 4.4 MIL/uL (ref 3.87–5.11)
RDW: 14.1 % (ref 11.5–15.5)
RDW: 14 % (ref 11.5–15.5)
WBC: 9.9 10*3/uL (ref 4.0–10.5)
WBC: 10.4 10*3/uL (ref 4.0–10.5)
nRBC: 0 % (ref 0.0–0.2)

## 2018-11-14 LAB — GLUCOSE, CAPILLARY
GLUCOSE-CAPILLARY: 150 mg/dL — AB (ref 70–99)
Glucose-Capillary: 183 mg/dL — ABNORMAL HIGH (ref 70–99)
Glucose-Capillary: 192 mg/dL — ABNORMAL HIGH (ref 70–99)
Glucose-Capillary: 155 mg/dL — ABNORMAL HIGH (ref 70–99)

## 2018-11-14 MED ORDER — FUROSEMIDE 10 MG/ML IJ SOLN
20.0000 mg | Freq: Once | INTRAMUSCULAR | Status: AC
  Administered 2018-11-14: 20 mg via INTRAVENOUS
  Filled 2018-11-14: qty 2

## 2018-11-14 MED ORDER — METHYLPREDNISOLONE SODIUM SUCC 40 MG IJ SOLR
40.0000 mg | Freq: Two times a day (BID) | INTRAMUSCULAR | Status: DC
  Administered 2018-11-14 – 2018-11-17 (×6): 40 mg via INTRAVENOUS
  Filled 2018-11-14 (×6): qty 1

## 2018-11-14 MED ORDER — DIATRIZOATE MEGLUMINE & SODIUM 66-10 % PO SOLN
90.0000 mL | Freq: Once | ORAL | Status: DC
  Filled 2018-11-14: qty 90

## 2018-11-14 MED ORDER — GLYCERIN (LAXATIVE) 2.1 G RE SUPP
1.0000 | Freq: Once | RECTAL | Status: AC
  Administered 2018-11-14: 1 via RECTAL
  Filled 2018-11-14: qty 1

## 2018-11-14 MED ORDER — MORPHINE SULFATE (PF) 2 MG/ML IV SOLN
1.0000 mg | INTRAVENOUS | Status: DC | PRN
  Administered 2018-11-14 – 2018-11-15 (×3): 1 mg via INTRAVENOUS
  Filled 2018-11-14 (×3): qty 1

## 2018-11-14 NOTE — Progress Notes (Signed)
After much encouragement, RN was able to sit patient up on side of bed. After 5 minutes, patient complained of tiredness and wanted to lay back in bed. RN educated patient. Patient back in bed, laying quietly with eyes closed. RN will continue to encourage mobilization.

## 2018-11-14 NOTE — Progress Notes (Signed)
PT Cancellation Note  Patient Details Name: Victoria Bird MRN: [id number] DOB: 1946/08/01   Cancelled Treatment:    Reason Eval/Treat Not Completed: Patient declined, patient adamantly refusing to mobilize. Patient states that she is tired.  RN also encouraged patient to no avail.    Tresa Endo [place] Pager [phone] Office [phone]  11/14/2018, 11:41 AM

## 2018-11-14 NOTE — Progress Notes (Signed)
[facility] Progress Note     Subjective: CC: ileus Patient passing some flatus but no stool. Patient refusing to mobilize. Reports she was told she can't have contrast - explained the difference between IV and PO contrast. Patient very ambivalent. Reports she can't poop and we don't understand that she had diarrhea for several days PTA.   Objective: Vital signs in last 24 hours: Temp:  [97.6 F (36.4 C)-98.8 F (37.1 C)] 98.4 F (36.9 C) (02/13 0307) Pulse Rate:  [75-98] 86 (02/13 0600) Resp:  [17-23] 20 (02/13 0600) BP: (141-175)/(42-90) 151/42 (02/13 0600) SpO2:  [93 %-99 %] 97 % (02/13 0600) Weight:  [70 kg] 70 kg (02/13 0500) Last BM Date: 11/07/18  Intake/Output from previous day: 02/12 0701 - 02/13 0700 In: 750 [P.O.:750] Out: 1150 [Urine:1150] Intake/Output this shift: No intake/output data recorded.  PE: Gen:  Alert, NAD Card:  Regular rate and rhythm Pulm:  On nasal cannula  Abd: Soft, non-tender, distended, bowel sounds hypoactive Skin: warm and dry, no rashes  Psych: A&Ox3   Lab Results:  Recent Labs    11/13/18 0245 11/14/18 0251  WBC 7.7 9.9  HGB 11.2* 11.6*  HCT 36.7 37.3  PLT 202 188   BMET Recent Labs    11/13/18 0245 11/14/18 0251  NA 139 138  K 5.0 5.5*  CL 103 102  CO2 26 26  GLUCOSE 192* 209*  BUN 84* 81*  CREATININE 2.30* 2.16*  CALCIUM 8.9 8.8*   PT/INR No results for input(s): LABPROT, INR in the last 72 hours. CMP     Component Value Date/Time   NA 138 11/14/2018 0251   K 5.5 (H) 11/14/2018 0251   CL 102 11/14/2018 0251   CO2 26 11/14/2018 0251   GLUCOSE 209 (H) 11/14/2018 0251   BUN 81 (H) 11/14/2018 0251   CREATININE 2.16 (H) 11/14/2018 0251   CALCIUM 8.8 (L) 11/14/2018 0251   PROT 6.8 11/06/2018 1651   ALBUMIN 3.5 11/06/2018 1651   AST 41 11/06/2018 1651   ALT 36 11/06/2018 1651   ALKPHOS 52 11/06/2018 1651   BILITOT 0.8 11/06/2018 1651   GFRNONAA 22 (L) 11/14/2018 0251   GFRAA 26 (L) 11/14/2018  0251   Lipase     Component Value Date/Time   LIPASE 32.0 07/29/2018 1430       Studies/Results: Dg Chest 1 View  Result Date: 11/12/2018 CLINICAL DATA:  Shortness of breath.  Chronic respiratory failure. EXAM: CHEST  1 VIEW COMPARISON:  Chest x-rays dated 11/11/2018, 11/09/2018 11/08/2018 and 10/24/2018 FINDINGS: Heart size and pulmonary vascularity are normal. No infiltrates or effusions. The lungs are hyperinflated consistent with previously demonstrated emphysema. Slight dilatation and calcification of the arch of the aorta. No significant change since the prior exams. IMPRESSION: Aortic Atherosclerosis (ICD10-I70.0) and Emphysema (ICD10-J43.9). No acute abnormalities. Electronically Signed   By: Lorriane Shire M.D.   On: 11/12/2018 11:35   Dg Abd 1 View  Result Date: 11/13/2018 CLINICAL DATA:  Ileus EXAM: ABDOMEN - 1 VIEW COMPARISON:  Yesterday FINDINGS: Unchanged generalized gaseous small bowel distention. Gas is seen within the colon, reaching the sigmoid colon and rectum on prior. Moderate gastric distention. No evidence of pneumatosis. No concerning gas collection. Cholecystectomy clips. IMPRESSION: History of ileus with unchanged gaseous bowel distension. Electronically Signed   By: Monte Fantasia M.D.   On: 11/13/2018 08:27   Dg Abd 2 Views  Result Date: 11/12/2018 CLINICAL DATA:  Abdominal pain and distention. EXAM: ABDOMEN - 2 VIEW COMPARISON:  Radiographs dated 03/19/2017 FINDINGS: There are multiple dilated loops of small bowel in the mid abdomen. Moderate air in the stomach. Scattered air in the nondistended colon. No visible free air on the decubitus view. No bone abnormality. IMPRESSION: Multiple dilated loops of small bowel which could represent small bowel obstruction. The colon is not distended. Electronically Signed   By: Lorriane Shire M.D.   On: 11/12/2018 11:43    Anti-infectives: Anti-infectives (From admission, onward)   Start     Dose/Rate Route Frequency  Ordered Stop   11/10/18 1800  levofloxacin (LEVAQUIN) tablet 500 mg     500 mg Oral Every 48 hours 11/09/18 1457 11/10/18 2000   11/08/18 1800  levofloxacin (LEVAQUIN) tablet 750 mg  Status:  Discontinued     750 mg Oral Every 48 hours 11/08/18 0841 11/09/18 1457   11/06/18 1930  levofloxacin (LEVAQUIN) IVPB 750 mg  Status:  Discontinued     750 mg 100 mL/hr over 90 Minutes Intravenous Every 48 hours 11/06/18 1901 11/08/18 0841   11/06/18 1900  levofloxacin (LEVAQUIN) IVPB 750 mg  Status:  Discontinued     750 mg 100 mL/hr over 90 Minutes Intravenous Every 24 hours 11/06/18 1852 11/06/18 1900       Assessment/Plan Acute on chronic Respiratory failure -metapneumoviruspositive on admit Severe COPD/tobacco use, quit last year- Home O2 Hx OSA Type II diabetes Hypertension Malnutrition GERD Anemia of chronic disease Urinary retention  Ileus vs SBO - Hx of abdominal hysterectomy, appendectomy, cholecystectomy - MOBILIZE - patient consistently refusing, will order IV pain medication for mobilization only  - suppository again today  - will order PO gastrografin today with follow up films  - if she becomes nauseated or vomits would recommend NGT placement - recommend bowel rest - patient taking sips of liquids   FEN: sips of liquids VTE: SCDs, SQ heparin  ID: levaquin 4/6>8/0   Patient complicating care by refusing interventions. Explained that it is ok for her to take oral contrast. Reiterated importance of bowel function and risks if intestines continue to be dilated with no intervention.   LOS: 8 days    Brigid Re , [facility] Surgery 11/14/2018, 7:40 AM Pager: [phone] Consults: [phone]

## 2018-11-14 NOTE — Progress Notes (Signed)
PROGRESS NOTE  Victoria Bird GMW:[id number] DOB: 1946/01/04 DOA: 11/06/2018 PCP: Hoyt Koch, MD   LOS: 8 days   Brief narrative: Patient is a 73 year old female history of chronic respiratory failure on 2 L nasal cannula chronically secondary to severe COPD with very severe obstruction on spirometry of July 2019, chronic kidney disease stage III versus 4, former tobacco user quit in 2019, hypertension presented to the ED with worsening shortness of breath and was admitted for an acute COPD exacerbation.  Patient on admission was placed in the stepdown unit and placed on BiPAP, IV steroids, scheduled nebulizer treatments.  Patient subsequently improved was taken off BiPAP and transferred to the telemetry floor.  Patient still with ongoing poor air movement.  On 11/29/2018 patient with worsening shortness of breath, use of accessory muscles of respiration, and subsequently transferred back to the stepdown unit to be placed on BiPAP. During stepdown stay, patient did have constipation and ileus.  Surgery was consulted.  Assessment/Plan:  Principal Problem:   Acute on chronic respiratory failure with hypoxia and hypercapnia ([name]) Active Problems:   Hypertension   GERD (gastroesophageal reflux disease)   OSA (obstructive sleep apnea)   Diabetes mellitus type 2, controlled ([name])   COPD exacerbation ([name])   Tobacco abuse   COPD with acute exacerbation ([name])   Chronic respiratory failure with hypoxia ([name])   Acute exacerbation of chronic obstructive pulmonary disease (COPD) ([name])   Malnutrition of moderate degree   ARF (acute renal failure) ([name])   Hyperkalemia   Abdominal discomfort  Acute on chronic hypoxic, hypercapnic respiratory failure secondary to COPD exacerbation.  Respiratory viral panel was positive for metapneumovirus. on oxygen nebulizer, IV steroids.  We will decrease the dose of IV steroids today.  Abdominal distention, constipation.  Has not had a bowel movement  yet.  She is refusing suppository and other modalities.  Surgery recommends Gastrografin study today.  No nausea or vomiting.  Abdominal x-ray from today shows persistent small bowel distention.  Surgery on board.  Will follow surgical recommendation.  History of GERD.  Continue PPI.  Acute kidney injury on chronic kidney disease stage III.  Discontinue Foley catheter today.  Renal ultrasound showed echogenic kidneys.  No hydronephrosis.   Mildly improving.  On oral fluids.  Moderate protein calorie malnutrition Continue nutritional supplementation.  Hypertension Continue hydralazine labetalol.  On Flomax.  Diuretics on hold.  Overall better today  Anemia of chronic disease Stable at this time.  Closely monitor CBC.  Diabetes mellitus type 2 Hemoglobin A1c 6.1 on 11/08/2018.  On Lantus and sliding scale insulin.    Urinary retention On a Foley catheter.  Flomax was initiated.  Kidney ultrasound with echogenic kidneys.  Voiding trial today.   Hyperkalemia Mild hyperkalemia noted again today.  Decrease the steroid.  Check BMP In a.m.  Noncompliance to treatment plan.  Patient was explained the need for ambulation and laxatives for constipation.  I also spoke with the patient's daughter about it.  I also had a long conversation with the patient's nurse at bedside.   VTE Prophylaxis: Heparin subcu  Code Status: Full code  Family Communication: I had a prolonged discussion with the patient's daughter on the phone and updated her about the clinical condition of the patient.  I have encouraged her to discuss with the patient regarding the need for ambulation in compliance with medical treatment.  Disposition Plan: Physical therapy recommends skilled nursing facility placement but patient has refused it.  She is also not ambulating  and not compliant with treatment.  I had a prolonged discussion with the patient's daughter about it who will come and talk to the patient about further  steps.  Consultants:  Surgery, PCCM  Procedures:  Chest x-ray 11/08/2018, 11/06/2018  VQ scan 11/08/2018  Renal ultrasound 11/09/2018  2D echo 11/07/2018   Antibiotics: Anti-infectives (From admission, onward)   Start     Dose/Rate Route Frequency Ordered Stop   11/10/18 1800  levofloxacin (LEVAQUIN) tablet 500 mg     500 mg Oral Every 48 hours 11/09/18 1457 11/10/18 2000   11/08/18 1800  levofloxacin (LEVAQUIN) tablet 750 mg  Status:  Discontinued     750 mg Oral Every 48 hours 11/08/18 0841 11/09/18 1457   11/06/18 1930  levofloxacin (LEVAQUIN) IVPB 750 mg  Status:  Discontinued     750 mg 100 mL/hr over 90 Minutes Intravenous Every 48 hours 11/06/18 1901 11/08/18 0841   11/06/18 1900  levofloxacin (LEVAQUIN) IVPB 750 mg  Status:  Discontinued     750 mg 100 mL/hr over 90 Minutes Intravenous Every 24 hours 11/06/18 1852 11/06/18 1900       Subjective: Patient denies any abdominal pain nausea or vomiting but has not had a bowel movement.  She has refused suppository or ambulation.  States that her breathing is okay.  Objective: Vitals:   11/14/18 0900 11/14/18 1000  BP: (!) 152/124 120/66  Pulse: 89 75  Resp: 17 20  Temp:    SpO2: 97% 93%    Intake/Output Summary (Last 24 hours) at 11/14/2018 1121 Last data filed at 11/14/2018 0600 Gross per 24 hour  Intake 750 ml  Output 1150 ml  Net -400 ml   Filed Weights   11/11/18 0500 11/12/18 0500 11/14/18 0500  Weight: 70.9 kg 71.5 kg 70 kg   Body mass index is 27.34 kg/m.   Physical Exam: GENERAL: Patient is alert awake and oriented.  Is frustrated and irritated.  Not in obvious distress.   on nasal cannula HENT: No scleral pallor or icterus. Pupils equally reactive to light. Oral mucosa is moist NECK: is supple, no palpable thyroid enlargement. CHEST: Diminished breath sounds bilaterally with coarse breath sounds. CVS: S1 and S2 heard, no murmur. Regular rate and rhythm. No pericardial rub. ABDOMEN: Soft, non-tender,  bowel sounds are present.  Mild abdominal distention noted.Marland Kitchen No palpable hepato-splenomegaly. EXTREMITIES: No edema CNS: Cranial nerves are intact. No focal motor or sensory deficits. SKIN: warm and dry without rashes.  Data Review: I have personally reviewed the following laboratory data and studies,  CBC: Recent Labs  Lab 11/09/18 0830 11/10/18 0409 11/11/18 0345 11/12/18 0322 11/13/18 0245 11/14/18 0251  WBC 7.7 6.1 6.2 7.6 7.7 9.9  NEUTROABS 6.8 5.4 5.4  --   --   --   HGB 10.5* 10.1* 10.1* 10.7* 11.2* 11.6*  HCT 34.9* 32.8* 33.3* 35.2* 36.7 37.3  MCV 88.6 89.4 90.5 88.7 87.0 89.2  PLT 221 214 190 205 202 834   Basic Metabolic Panel: Recent Labs  Lab 11/09/18 0830  11/11/18 0345 11/12/18 0322 11/12/18 0755 11/12/18 1351 11/13/18 0245 11/14/18 0251  NA 138   < > 140 139  --  136 139 138  K 4.9   < > 5.0 5.7* 5.6* 5.4* 5.0 5.5*  CL 104   < > 107 105  --  102 103 102  CO2 25   < > 24 25  --  26 26 26   GLUCOSE 181*   < > 174* 227*  --  328* 192* 209*  BUN 85*   < > 74* 75*  --  89* 84* 81*  CREATININE 3.15*   < > 2.45* 2.19*  --  2.33* 2.30* 2.16*  CALCIUM 8.7*   < > 8.9 9.1  --  8.9 8.9 8.8*  MG 2.7*  --   --   --   --  2.4  --   --    < > = values in this interval not displayed.   Liver Function Tests: No results for input(s): AST, ALT, ALKPHOS, BILITOT, PROT, ALBUMIN in the last 168 hours. No results for input(s): LIPASE, AMYLASE in the last 168 hours. No results for input(s): AMMONIA in the last 168 hours. Cardiac Enzymes: No results for input(s): CKTOTAL, CKMB, CKMBINDEX, TROPONINI in the last 168 hours. BNP (last 3 results) Recent Labs    10/24/18 1912  BNP 45.6    ProBNP (last 3 results) Recent Labs    04/10/18 1715  PROBNP 37.0    CBG: Recent Labs  Lab 11/13/18 0823 11/13/18 1205 11/13/18 1808 11/13/18 2139 11/14/18 0809  GLUCAP 156* 208* 230* 254* 183*   Recent Results (from the past 240 hour(s))  Respiratory Panel by PCR      Status: Abnormal   Collection Time: 11/07/18  2:40 PM  Result Value Ref Range Status   Adenovirus NOT DETECTED NOT DETECTED Final   Coronavirus 229E NOT DETECTED NOT DETECTED Final    Comment: (NOTE) The Coronavirus on the Respiratory Panel, DOES NOT test for the novel  Coronavirus (2019 nCoV)    Coronavirus HKU1 NOT DETECTED NOT DETECTED Final   Coronavirus NL63 NOT DETECTED NOT DETECTED Final   Coronavirus OC43 NOT DETECTED NOT DETECTED Final   Metapneumovirus DETECTED (A) NOT DETECTED Final   Rhinovirus / Enterovirus NOT DETECTED NOT DETECTED Final   Influenza A NOT DETECTED NOT DETECTED Final   Influenza B NOT DETECTED NOT DETECTED Final   Parainfluenza Virus 1 NOT DETECTED NOT DETECTED Final   Parainfluenza Virus 2 NOT DETECTED NOT DETECTED Final   Parainfluenza Virus 3 NOT DETECTED NOT DETECTED Final   Parainfluenza Virus 4 NOT DETECTED NOT DETECTED Final   Respiratory Syncytial Virus NOT DETECTED NOT DETECTED Final   Bordetella pertussis NOT DETECTED NOT DETECTED Final   Chlamydophila pneumoniae NOT DETECTED NOT DETECTED Final   Mycoplasma pneumoniae NOT DETECTED NOT DETECTED Final    Comment: Performed at [facility], [place] [address], [place], [place] [address]  MRSA PCR Screening     Status: None   Collection Time: 11/09/18  1:35 PM  Result Value Ref Range Status   MRSA by PCR NEGATIVE NEGATIVE Final    Comment:        The GeneXpert MRSA Assay (FDA approved for NASAL specimens only), is one component of a comprehensive MRSA colonization surveillance program. It is not intended to diagnose MRSA infection nor to guide or monitor treatment for MRSA infections. Performed at [facility], [place] [address] [place], [place] [address]      Studies: Dg Abd 1 View  Result Date: 11/13/2018 CLINICAL DATA:  Ileus EXAM: ABDOMEN - 1 VIEW COMPARISON:  Yesterday FINDINGS: Unchanged generalized gaseous small bowel distention. Gas is seen within the colon,  reaching the sigmoid colon and rectum on prior. Moderate gastric distention. No evidence of pneumatosis. No concerning gas collection. Cholecystectomy clips. IMPRESSION: History of ileus with unchanged gaseous bowel distension. Electronically Signed   By: Monte Fantasia M.D.   On: 11/13/2018  08:27   Dg Abd Portable 2v  Result Date: 11/14/2018 CLINICAL DATA:  Ileus EXAM: PORTABLE ABDOMEN - 2 VIEW COMPARISON:  11/13/2010 FINDINGS: A persistent diffuse dilated gas-filled loops of small bowel measuring up to 4 cm. Degree of distention is not changed. There is gas in the colon and rectum. No evidence of intraperitoneal free air on the portable decubitus view. IMPRESSION: Persistent diffuse small bowel gas distension consistent with ileus. No significant interval change. Electronically Signed   By: Suzy Bouchard M.D.   On: 11/14/2018 08:36    Scheduled Meds: . bisacodyl  10 mg Rectal Once  . diatrizoate meglumine-sodium  90 mL Per NG tube Once  . fluticasone  2 spray Each Nare Daily  . heparin  5,000 Units Subcutaneous Q8H  . hydrALAZINE  25 mg Oral Q8H  . insulin aspart  0-9 Units Subcutaneous TID WC  . insulin glargine  10 Units Subcutaneous Daily  . ipratropium-albuterol  3 mL Nebulization TID  . labetalol  100 mg Oral BID  . loratadine  10 mg Oral Daily  . mouth rinse  15 mL Mouth Rinse BID  . methylPREDNISolone (SOLU-MEDROL) injection  60 mg Intravenous Q8H  . pantoprazole  40 mg Oral Daily  . sodium chloride flush  3 mL Intravenous Q12H  . tamsulosin  0.4 mg Oral QPC supper    Continuous Infusions:   Flora Lipps, MD  [facility] 11/14/2018

## 2018-11-15 ENCOUNTER — Inpatient Hospital Stay (HOSPITAL_COMMUNITY): Payer: Medicare Other

## 2018-11-15 LAB — CBC
HCT: 39.1 % (ref 36.0–46.0)
Hemoglobin: 12 g/dL (ref 12.0–15.0)
MCH: 27.5 pg (ref 26.0–34.0)
MCHC: 30.7 g/dL (ref 30.0–36.0)
MCV: 89.7 fL (ref 80.0–100.0)
NRBC: 0 % (ref 0.0–0.2)
Platelets: 212 10*3/uL (ref 150–400)
RBC: 4.36 MIL/uL (ref 3.87–5.11)
RDW: 14.3 % (ref 11.5–15.5)
WBC: 13.4 10*3/uL — ABNORMAL HIGH (ref 4.0–10.5)

## 2018-11-15 LAB — BASIC METABOLIC PANEL
Anion gap: 10 (ref 5–15)
BUN: 85 mg/dL — ABNORMAL HIGH (ref 8–23)
CHLORIDE: 98 mmol/L (ref 98–111)
CO2: 29 mmol/L (ref 22–32)
CREATININE: 2.41 mg/dL — AB (ref 0.44–1.00)
Calcium: 8.8 mg/dL — ABNORMAL LOW (ref 8.9–10.3)
GFR calc non Af Amer: 20 mL/min — ABNORMAL LOW (ref >60)
GFR, EST AFRICAN AMERICAN: 23 mL/min — AB (ref >60)
Glucose, Bld: 150 mg/dL — ABNORMAL HIGH (ref 70–99)
Potassium: 5 mmol/L (ref 3.5–5.1)
Sodium: 137 mmol/L (ref 135–145)

## 2018-11-15 LAB — GLUCOSE, CAPILLARY
GLUCOSE-CAPILLARY: 201 mg/dL — AB (ref 70–99)
Glucose-Capillary: 137 mg/dL — ABNORMAL HIGH (ref 70–99)
Glucose-Capillary: 276 mg/dL — ABNORMAL HIGH (ref 70–99)
Glucose-Capillary: 130 mg/dL — ABNORMAL HIGH (ref 70–99)

## 2018-11-15 MED ORDER — POLYETHYLENE GLYCOL 3350 17 G PO PACK
17.0000 g | PACK | Freq: Two times a day (BID) | ORAL | Status: DC
  Administered 2018-11-17: 17 g via ORAL
  Filled 2018-11-15 (×3): qty 1

## 2018-11-15 MED ORDER — ONDANSETRON HCL 4 MG/2ML IJ SOLN
4.0000 mg | INTRAMUSCULAR | Status: DC | PRN
  Administered 2018-11-16 – 2018-11-30 (×7): 4 mg via INTRAVENOUS
  Filled 2018-11-15 (×7): qty 2

## 2018-11-15 MED ORDER — MORPHINE SULFATE (PF) 2 MG/ML IV SOLN
1.0000 mg | Freq: Four times a day (QID) | INTRAVENOUS | Status: DC | PRN
  Administered 2018-11-15 – 2018-11-18 (×6): 1 mg via INTRAVENOUS
  Filled 2018-11-15 (×6): qty 1

## 2018-11-15 MED ORDER — BISACODYL 10 MG RE SUPP
10.0000 mg | Freq: Every day | RECTAL | Status: DC
  Administered 2018-11-15 – 2018-11-24 (×8): 10 mg via RECTAL
  Filled 2018-11-15 (×12): qty 1

## 2018-11-15 MED ORDER — POLYETHYLENE GLYCOL 3350 17 G PO PACK
17.0000 g | PACK | Freq: Every day | ORAL | Status: DC
  Administered 2018-11-15: 17 g via ORAL
  Filled 2018-11-15: qty 1

## 2018-11-15 NOTE — Progress Notes (Signed)
Physical Therapy Treatment Patient Details Name: Victoria Bird MRN: [id number] DOB: 1946/05/21 Today's Date: 11/15/2018    History of Present Illness 73 year old female history of chronic respiratory failure on 2 L nasal cannula chronically secondary to severe COPD with very severe obstruction on spirometry of July 2019, chronic kidney disease stage III versus 4, former tobacco user quit in 2019, hypertension presented to the ED with worsening shortness of breath and admitted for an acute COPD exacerbation.  Patient on admission was placed in the stepdown unit and placed on BiPAP, IV steroids, scheduled nebulizer treatments.  Patient subsequently improved was taken off BiPAP and transferred to the telemetry floor.  Patient still with ongoing poor air movement.  On 11/09/2018 patient with worsening shortness of breath, use of accessory muscles of respiration, and subsequently transferred back to the stepdown unit to be placed on BiPAP. Pt with recent admission and discharge 1/23-1/27.     PT Comments    Pt cooperative this am but ltd by complaints of dizziness/nausea with move to EOB sitting.  BP supine 150/72; in sitting 117/64 - RN in room.   Follow Up Recommendations  SNF     Equipment Recommendations  None recommended by PT    Recommendations for Other Services       Precautions / Restrictions Precautions Precautions: Fall Precaution Comments: monitor O2 sats, pt on 2L O2 at baseline Restrictions Weight Bearing Restrictions: No    Mobility  Bed Mobility Overal bed mobility: Needs Assistance Bed Mobility: Supine to Sit     Supine to sit: Min assist;Mod assist Sit to supine: Min assist;+2 for physical assistance;+2 for safety/equipment   General bed mobility comments: increased time and cues for sequence to move to EOB;  Increased assist back to bed 2* fatigue  Transfers                 General transfer comment: NT, pt c/o fatigue and nausea  Ambulation/Gait                  Stairs             Wheelchair Mobility    Modified Rankin (Stroke Patients Only)       Balance Overall balance assessment: Needs assistance Sitting-balance support: Feet supported;Bilateral upper extremity supported Sitting balance-Leahy Scale: Fair                                      Cognition Arousal/Alertness: Awake/alert Behavior During Therapy: WFL for tasks assessed/performed;Anxious Overall Cognitive Status: Within Functional Limits for tasks assessed                                        Exercises      General Comments        Pertinent Vitals/Pain Pain Assessment: No/denies pain    Home Living                      Prior Function            PT Goals (current goals can now be found in the care plan section) Acute Rehab PT Goals Patient Stated Goal: breathe better  PT Goal Formulation: With patient Time For Goal Achievement: 11/23/18 Potential to Achieve Goals: Good Progress towards PT goals: Progressing toward goals    Frequency  Min 2X/week      PT Plan Current plan remains appropriate    Co-evaluation              AM-PAC PT "6 Clicks" Mobility   Outcome Measure  Help needed turning from your back to your side while in a flat bed without using bedrails?: A Little Help needed moving from lying on your back to sitting on the side of a flat bed without using bedrails?: A Little Help needed moving to and from a bed to a chair (including a wheelchair)?: A Lot Help needed standing up from a chair using your arms (e.g., wheelchair or bedside chair)?: A Lot Help needed to walk in hospital room?: A Lot Help needed climbing 3-5 steps with a railing? : Total 6 Click Score: 13    End of Session Equipment Utilized During Treatment: Gait belt Activity Tolerance: Patient limited by fatigue Patient left: in bed;with call bell/phone within reach;with nursing/sitter in  room Nurse Communication: Mobility status PT Visit Diagnosis: Muscle weakness (generalized) (M62.81);Other abnormalities of gait and mobility (R26.89)     Time: 1120-1140 PT Time Calculation (min) (ACUTE ONLY): 20 min  Charges:  $Therapeutic Activity: 8-22 mins                     Debe Coder PT Acute Rehabilitation Services Pager [phone] Office [phone]    [facility] 11/15/2018, 12:33 PM

## 2018-11-15 NOTE — Progress Notes (Signed)
PROGRESS NOTE  Victoria Bird KGY:[id number] DOB: 11-03-1945 DOA: 11/06/2018 PCP: Hoyt Koch, MD   LOS: 9 days   Brief narrative: Patient is a 73 year old female history of chronic respiratory failure on 2 L nasal cannula chronically secondary to severe COPD with very severe obstruction on spirometry of July 2019, chronic kidney disease stage III versus 4, former tobacco user quit in 2019, hypertension presented to the ED with worsening shortness of breath and was admitted for an acute COPD exacerbation.  Patient on admission was placed in the stepdown unit and placed on BiPAP, IV steroids, scheduled nebulizer treatments.  Patient subsequently improved was taken off BiPAP and transferred to the telemetry floor.  Patient still with ongoing poor air movement.  On 11/29/2018 patient with worsening shortness of breath, use of accessory muscles of respiration, and subsequently transferred back to the stepdown unit to be placed on BiPAP. During stepdown stay, patient did have constipation and ileus.  Surgery was consulted.  Assessment/Plan:  Principal Problem:   Acute on chronic respiratory failure with hypoxia and hypercapnia ([name]) Active Problems:   Hypertension   GERD (gastroesophageal reflux disease)   OSA (obstructive sleep apnea)   Diabetes mellitus type 2, controlled ([name])   COPD exacerbation ([name])   Tobacco abuse   COPD with acute exacerbation ([name])   Chronic respiratory failure with hypoxia ([name])   Acute exacerbation of chronic obstructive pulmonary disease (COPD) ([name])   Malnutrition of moderate degree   ARF (acute renal failure) ([name])   Hyperkalemia   Abdominal discomfort  Acute on chronic hypoxic, hypercapnic respiratory failure secondary to COPD exacerbation.  Respiratory viral panel was positive for metapneumovirus.continue nebulizer, IV steroids.  We will continue to decrease the dose of IV steroids.  Abdominal distention, constipation.  Patient has been refusing  Gastrografin study, intermittently allowing suppositories.  We will add MiraLAX.  I had a long conversation with the patient regarding the need for compliance with treatment.   History of GERD.  Continue PPI.  Acute kidney injury on chronic kidney disease stage III.    Renal ultrasound showed echogenic kidneys.  No hydronephrosis.   Off Foley cath  Moderate protein calorie malnutrition Continue nutritional supplementation.  Hypertension Continue hydralazine labetalol.  On Flomax.  Diuretics on hold.  Overall better today  Anemia of chronic disease Stable at this time.  Closely monitor CBC.  Diabetes mellitus type 2 Hemoglobin A1c 6.1 on 11/08/2018.  On Lantus and sliding scale insulin.    Urinary retention   Improved. Flomax was initiated.  Kidney ultrasound with echogenic kidneys.    Hyperkalemia improved  Noncompliance to treatment plan.  Patient was very explained the need for ambulation and use of laxative for constipation.  Debility, deconditioning. Patient has been seen by physical therapy who recommended skilled nursing facility placement.    VTE Prophylaxis: Heparin subcu  Code Status: Full code  Family Communication: Spoke with the patient's daughter yesterday and updated her about the clinical condition of the patient.    Disposition Plan: Difficult at this time.  Likely 2 to 3 days.  Patient would benefit from skilled nursing facility placement as per physical therapy recommendation.  We will continue to attempt to ambulate the patient and encourage to participate in treatment plan.  Consultants:  Surgery, PCCM  Procedures:  Chest x-ray 11/08/2018, 11/06/2018  VQ scan 11/08/2018  Renal ultrasound 11/09/2018  2D echo 11/07/2018   Antibiotics: Anti-infectives (From admission, onward)   Start     Dose/Rate Route Frequency Ordered Stop  11/10/18 1800  levofloxacin (LEVAQUIN) tablet 500 mg     500 mg Oral Every 48 hours 11/09/18 1457 11/10/18 2000    11/08/18 1800  levofloxacin (LEVAQUIN) tablet 750 mg  Status:  Discontinued     750 mg Oral Every 48 hours 11/08/18 0841 11/09/18 1457   11/06/18 1930  levofloxacin (LEVAQUIN) IVPB 750 mg  Status:  Discontinued     750 mg 100 mL/hr over 90 Minutes Intravenous Every 48 hours 11/06/18 1901 11/08/18 0841   11/06/18 1900  levofloxacin (LEVAQUIN) IVPB 750 mg  Status:  Discontinued     750 mg 100 mL/hr over 90 Minutes Intravenous Every 24 hours 11/06/18 1852 11/06/18 1900       Subjective:  Patient denies any nausea, vomiting or abdominal pain.  She has not moved her bowels.  Passing flatus.  Noncompliant with treatment.  Objective: Vitals:   11/15/18 1100 11/15/18 1200  BP: (!) 150/57 (!) 114/92  Pulse: 95 76  Resp: 20 20  Temp:  98.2 F (36.8 C)  SpO2: 99% 99%    Intake/Output Summary (Last 24 hours) at 11/15/2018 1406 Last data filed at 11/15/2018 1100 Gross per 24 hour  Intake 3 ml  Output 300 ml  Net -297 ml   Filed Weights   11/12/18 0500 11/14/18 0500 11/15/18 0500  Weight: 71.5 kg 70 kg 70.7 kg   Body mass index is 27.61 kg/m.   Physical Exam: General:  Average built, not in obvious distress.  On nasal cannula HENT: Normocephalic, pupils equally reacting to light and accommodation.  No scleral pallor or icterus noted. Oral mucosa is moist.  Chest: Diminished breath sounds bilaterally with coarse breath sounds CVS: S1 &S2 heard. No murmur.  Regular rate and rhythm. Abdomen: Soft, nontender, mildly distended.  Bowel sounds are heard.  Liver is not palpable, no abdominal mass palpated Extremities: No cyanosis, clubbing or edema.  Peripheral pulses are palpable. Psych: Alert, awake and oriented, normal mood CNS:  No cranial nerve deficits.  Power equal in all extremities.  No sensory deficits noted.  No cerebellar signs.   Skin: Warm and dry.  No rashes noted.   Data Review: I have personally reviewed the following laboratory data and studies,  CBC: Recent Labs    Lab 11/09/18 0830 11/10/18 0409 11/11/18 0345 11/12/18 0322 11/13/18 0245 11/14/18 0251 11/14/18 1226 11/15/18 0743  WBC 7.7 6.1 6.2 7.6 7.7 9.9 10.4 13.4*  NEUTROABS 6.8 5.4 5.4  --   --   --   --   --   HGB 10.5* 10.1* 10.1* 10.7* 11.2* 11.6* 11.8* 12.0  HCT 34.9* 32.8* 33.3* 35.2* 36.7 37.3 38.3 39.1  MCV 88.6 89.4 90.5 88.7 87.0 89.2 87.0 89.7  PLT 221 214 190 205 202 188 209 664   Basic Metabolic Panel: Recent Labs  Lab 11/09/18 0830  11/12/18 1351 11/13/18 0245 11/14/18 0251 11/14/18 1226 11/15/18 0743  NA 138   < > 136 139 138 137 137  K 4.9   < > 5.4* 5.0 5.5* 5.2* 5.0  CL 104   < > 102 103 102 98 98  CO2 25   < > 26 26 26 27 29   GLUCOSE 181*   < > 328* 192* 209* 210* 150*  BUN 85*   < > 89* 84* 81* 82* 85*  CREATININE 3.15*   < > 2.33* 2.30* 2.16* 2.32* 2.41*  CALCIUM 8.7*   < > 8.9 8.9 8.8* 9.0 8.8*  MG 2.7*  --  2.4  --   --   --   --    < > =  values in this interval not displayed.   Liver Function Tests: No results for input(s): AST, ALT, ALKPHOS, BILITOT, PROT, ALBUMIN in the last 168 hours. No results for input(s): LIPASE, AMYLASE in the last 168 hours. No results for input(s): AMMONIA in the last 168 hours. Cardiac Enzymes: No results for input(s): CKTOTAL, CKMB, CKMBINDEX, TROPONINI in the last 168 hours. BNP (last 3 results) Recent Labs    10/24/18 1912  BNP 45.6    ProBNP (last 3 results) Recent Labs    04/10/18 1715  PROBNP 37.0    CBG: Recent Labs  Lab 11/14/18 1207 11/14/18 1641 11/14/18 2215 11/15/18 0754 11/15/18 1214  GLUCAP 192* 155* 150* 137* 276*   Recent Results (from the past 240 hour(s))  Respiratory Panel by PCR     Status: Abnormal   Collection Time: 11/07/18  2:40 PM  Result Value Ref Range Status   Adenovirus NOT DETECTED NOT DETECTED Final   Coronavirus 229E NOT DETECTED NOT DETECTED Final    Comment: (NOTE) The Coronavirus on the Respiratory Panel, DOES NOT test for the novel  Coronavirus (2019 nCoV)     Coronavirus HKU1 NOT DETECTED NOT DETECTED Final   Coronavirus NL63 NOT DETECTED NOT DETECTED Final   Coronavirus OC43 NOT DETECTED NOT DETECTED Final   Metapneumovirus DETECTED (A) NOT DETECTED Final   Rhinovirus / Enterovirus NOT DETECTED NOT DETECTED Final   Influenza A NOT DETECTED NOT DETECTED Final   Influenza B NOT DETECTED NOT DETECTED Final   Parainfluenza Virus 1 NOT DETECTED NOT DETECTED Final   Parainfluenza Virus 2 NOT DETECTED NOT DETECTED Final   Parainfluenza Virus 3 NOT DETECTED NOT DETECTED Final   Parainfluenza Virus 4 NOT DETECTED NOT DETECTED Final   Respiratory Syncytial Virus NOT DETECTED NOT DETECTED Final   Bordetella pertussis NOT DETECTED NOT DETECTED Final   Chlamydophila pneumoniae NOT DETECTED NOT DETECTED Final   Mycoplasma pneumoniae NOT DETECTED NOT DETECTED Final    Comment: Performed at [facility], [place] [address] [place], [place] [address]  MRSA PCR Screening     Status: None   Collection Time: 11/09/18  1:35 PM  Result Value Ref Range Status   MRSA by PCR NEGATIVE NEGATIVE Final    Comment:        The GeneXpert MRSA Assay (FDA approved for NASAL specimens only), is one component of a comprehensive MRSA colonization surveillance program. It is not intended to diagnose MRSA infection nor to guide or monitor treatment for MRSA infections. Performed at [facility], [place] [address], [place], [place] [address]      Studies: Dg Abd 2 Views  Result Date: 11/15/2018 CLINICAL DATA:  Ileus EXAM: ABDOMEN - 2 VIEW COMPARISON:  Yesterday FINDINGS: Continued generalized small bowel and colonic distension. There are fluid levels on the decubitus view without pneumoperitoneum. Superimposed ovoid soft tissue density over the right lower quadrant IMPRESSION: Continued ileus. Electronically Signed   By: Monte Fantasia M.D.   On: 11/15/2018 07:36   Dg Abd Portable 2v  Result Date: 11/14/2018 CLINICAL DATA:  Ileus EXAM: PORTABLE  ABDOMEN - 2 VIEW COMPARISON:  11/13/2010 FINDINGS: A persistent diffuse dilated gas-filled loops of small bowel measuring up to 4 cm. Degree of distention is not changed. There is gas in the colon and rectum. No evidence of intraperitoneal free air on the portable decubitus view. IMPRESSION: Persistent diffuse small bowel gas distension consistent with ileus. No significant interval change. Electronically Signed   By: Helane Gunther.D.  On: 11/14/2018 08:36    Scheduled Meds: . bisacodyl  10 mg Rectal Once  . diatrizoate meglumine-sodium  90 mL Per NG tube Once  . fluticasone  2 spray Each Nare Daily  . heparin  5,000 Units Subcutaneous Q8H  . hydrALAZINE  25 mg Oral Q8H  . insulin aspart  0-9 Units Subcutaneous TID WC  . insulin glargine  10 Units Subcutaneous Daily  . ipratropium-albuterol  3 mL Nebulization TID  . labetalol  100 mg Oral BID  . loratadine  10 mg Oral Daily  . mouth rinse  15 mL Mouth Rinse BID  . methylPREDNISolone (SOLU-MEDROL) injection  40 mg Intravenous Q12H  . pantoprazole  40 mg Oral Daily  . polyethylene glycol  17 g Oral Daily  . sodium chloride flush  3 mL Intravenous Q12H  . tamsulosin  0.4 mg Oral QPC supper    Continuous Infusions:   Flora Lipps, MD  [facility] 11/15/2018

## 2018-11-15 NOTE — Final Consult Note (Signed)
Consultant Final Sign-Off Note    Assessment/Final recommendations  Victoria Bird is a 73 y.o. female followed by me for ileus. Patient is currently passing flatus and tolerating clear liquids. She is consistently refusing mobilization against my recommendation. She refused gastrografin yesterday. She has intermittently allowed suppositories. Would recommend continuing liquids until she has a BM. Could trial oral erythromycin as a motility agent as well. Recommend suppositories as patient will allow, and could try enema if she will allow. She currently is refusing any laxatives.   This patient has an ileus not an obstruction and has no current indications for surgical intervention. We will sign off at this time. If we can be of further assistance please call us.  Wound care (if applicable): N/A   Diet at discharge: liquids with advancement to soft when having more bowel function    Activity at discharge: per primary team   Follow-up appointment:     Pending results:  Unresulted Labs (From admission, onward)    Start     Ordered   11/15/18 2633  Basic metabolic panel  Once,   R    Question:  Specimen collection method  Answer:  Lab=Lab collect   11/15/18 0712           Medication recommendations: suppositories and enema as tolerated, Could try PO erythromycin for motility   Other recommendations: Could consider GI consult since this is a motility issue not an obstruction    Thank you for allowing [place] to participate in the care of your patient!  Please consult [place] again if you have further needs for your patient.  Claiborne Billings Rayburn 11/15/2018 8:19 AM    Subjective   Patient is tired this AM. Reports some mild intermittent abdominal pain. Denies nausea. Tolerating clears. Passing flatus. Still declining mobilization.   Objective  Vital signs in last 24 hours: Temp:  [98.3 F (36.8 C)-99.9 F (37.7 C)] 98.3 F (36.8 C) (02/14 0400) Pulse Rate:  [75-106] 97 (02/14  0600) Resp:  [17-25] 21 (02/14 0600) BP: (120-170)/(54-124) 145/66 (02/14 0600) SpO2:  [93 %-99 %] 93 % (02/14 0600) Weight:  [70.7 kg] 70.7 kg (02/14 0500)   Physical Exam Gen: Alert, NAD Card: Regular rate and rhythm Pulm:On nasal cannula Abd: Soft, mildly tender in lower abdomen, mildly distended, bowel sounds hypoactive Skin: warm and dry, no rashes  Psych: A&Ox3, flat affect, withdrawn    Pertinent labs and Studies: Recent Labs    11/14/18 0251 11/14/18 1226 11/15/18 0743  WBC 9.9 10.4 13.4*  HGB 11.6* 11.8* 12.0  HCT 37.3 38.3 39.1   BMET Recent Labs    11/14/18 0251 11/14/18 1226  NA 138 137  K 5.5* 5.2*  CL 102 98  CO2 26 27  GLUCOSE 209* 210*  BUN 81* 82*  CREATININE 2.16* 2.32*  CALCIUM 8.8* 9.0   No results for input(s): LABURIN in the last 72 hours. Results for orders placed or performed during the hospital encounter of 11/06/18  Respiratory Panel by PCR     Status: Abnormal   Collection Time: 11/07/18  2:40 PM  Result Value Ref Range Status   Adenovirus NOT DETECTED NOT DETECTED Final   Coronavirus 229E NOT DETECTED NOT DETECTED Final    Comment: (NOTE) The Coronavirus on the Respiratory Panel, DOES NOT test for the novel  Coronavirus (2019 nCoV)    Coronavirus HKU1 NOT DETECTED NOT DETECTED Final   Coronavirus NL63 NOT DETECTED NOT DETECTED Final   Coronavirus OC43 NOT DETECTED NOT DETECTED  Final   Metapneumovirus DETECTED (A) NOT DETECTED Final   Rhinovirus / Enterovirus NOT DETECTED NOT DETECTED Final   Influenza A NOT DETECTED NOT DETECTED Final   Influenza B NOT DETECTED NOT DETECTED Final   Parainfluenza Virus 1 NOT DETECTED NOT DETECTED Final   Parainfluenza Virus 2 NOT DETECTED NOT DETECTED Final   Parainfluenza Virus 3 NOT DETECTED NOT DETECTED Final   Parainfluenza Virus 4 NOT DETECTED NOT DETECTED Final   Respiratory Syncytial Virus NOT DETECTED NOT DETECTED Final   Bordetella pertussis NOT DETECTED NOT DETECTED Final    Chlamydophila pneumoniae NOT DETECTED NOT DETECTED Final   Mycoplasma pneumoniae NOT DETECTED NOT DETECTED Final    Comment: Performed at [facility], [place] [address], [place], [place] [address]  MRSA PCR Screening     Status: None   Collection Time: 11/09/18  1:35 PM  Result Value Ref Range Status   MRSA by PCR NEGATIVE NEGATIVE Final    Comment:        The GeneXpert MRSA Assay (FDA approved for NASAL specimens only), is one component of a comprehensive MRSA colonization surveillance program. It is not intended to diagnose MRSA infection nor to guide or monitor treatment for MRSA infections. Performed at [facility], [place] [address] [place], [place] [address]     Imaging: Dg Abd 2 Views  Result Date: 11/15/2018 CLINICAL DATA:  Ileus EXAM: ABDOMEN - 2 VIEW COMPARISON:  Yesterday FINDINGS: Continued generalized small bowel and colonic distension. There are fluid levels on the decubitus view without pneumoperitoneum. Superimposed ovoid soft tissue density over the right lower quadrant IMPRESSION: Continued ileus. Electronically Signed   By: Monte Fantasia M.D.   On: 11/15/2018 07:36

## 2018-11-16 LAB — GLUCOSE, CAPILLARY
GLUCOSE-CAPILLARY: 140 mg/dL — AB (ref 70–99)
Glucose-Capillary: 153 mg/dL — ABNORMAL HIGH (ref 70–99)
Glucose-Capillary: 142 mg/dL — ABNORMAL HIGH (ref 70–99)
Glucose-Capillary: 119 mg/dL — ABNORMAL HIGH (ref 70–99)

## 2018-11-16 MED ORDER — IPRATROPIUM-ALBUTEROL 0.5-2.5 (3) MG/3ML IN SOLN
3.0000 mL | Freq: Two times a day (BID) | RESPIRATORY_TRACT | Status: DC
  Filled 2018-11-16: qty 3

## 2018-11-16 NOTE — Progress Notes (Addendum)
PROGRESS NOTE  Victoria Bird:[id number] DOB: 12/21/1945 DOA: 11/06/2018 PCP: Hoyt Koch, MD   LOS: 10 days   Brief narrative:  Patient is a 73 year old female history of chronic respiratory failure on 2 L nasal cannula chronically secondary to severe COPD with very severe obstruction on spirometry of July 2019, chronic kidney disease stage III versus 4, former tobacco user quit in 2019, hypertension presented to the ED with worsening shortness of breath and was admitted for an acute COPD exacerbation.  Patient on admission was placed in the stepdown unit and placed on BiPAP, IV steroids, scheduled nebulizer treatments.  Patient subsequently improved was taken off BiPAP and transferred to the telemetry floor.  Patient still with ongoing poor air movement.  On 11/29/2018 patient with worsening shortness of breath, use of accessory muscles of respiration, and subsequently transferred back to the stepdown unit to be placed on BiPAP. During stepdown stay, patient did have constipation and ileus.  Surgery was consulted.  Surgery recommended conservative treatment.  Assessment/Plan:  Principal Problem:   Acute on chronic respiratory failure with hypoxia and hypercapnia ([name]) Active Problems:   Hypertension   GERD (gastroesophageal reflux disease)   OSA (obstructive sleep apnea)   Diabetes mellitus type 2, controlled ([name])   COPD exacerbation ([name])   Tobacco abuse   COPD with acute exacerbation ([name])   Chronic respiratory failure with hypoxia ([name])   Acute exacerbation of chronic obstructive pulmonary disease (COPD) ([name])   Malnutrition of moderate degree   ARF (acute renal failure) ([name])   Hyperkalemia   Abdominal discomfort  Acute on chronic hypoxic, hypercapnic respiratory failure secondary to COPD exacerbation.  Respiratory viral panel was positive for metapneumovirus.  Continue nebulizer oxygen supplementation.  Steroids have been tapered.  We will continue to  taper.  Abdominal distention, constipation.  Patient finally agreed to have an enema yesterday with some bowel movement.  Abdomen is still distended with some nausea.  Patient was encouraged to take medications and ambulation.  Will obtain x-ray of the abdomen today for follow-up.  Leukocytosis.  On IV steroids.  Trending up.  Will closely monitor.  We will continue to decrease steroids.  History of GERD.  Continue PPI.  Acute kidney injury on chronic kidney disease stage III.    Renal ultrasound showed echogenic kidneys.  No hydronephrosis.   Off Foley cath.  Creatinine levels have pretty much remained stable.  Moderate protein calorie malnutrition Patient was encouraged oral intake.  Hypertension Continue hydralazine labetalol.  On Flomax.   Anemia of chronic disease Stable at this time.  Closely monitor CBC.  Diabetes mellitus type 2 Hemoglobin A1c 6.1 on 11/08/2018.  On Lantus and sliding scale insulin.  Overall glycemic control is adequate  Urinary retention Resolved.  Continue fluids   Hyperkalemia Improved, decrease steroids  Noncompliance to treatment plan.  Intermittent.  Not motivated for physical therapy  Debility, deconditioning. Patient has been seen by physical therapy who recommended skilled nursing facility placement patient has refused.  She wishes to be discharged home.  VTE Prophylaxis: Heparin subcu  Code Status: Full code  Family Communication: No one available at bedside.  Disposition Plan: Likely to home with home health services in 1 to 2 days, will try to ambulate the patient.  Will transfer out of the stepdown unit.  Consultants:  Surgery, PCCM  Procedures:  Chest x-ray 11/08/2018, 11/06/2018  VQ scan 11/08/2018  Renal ultrasound 11/09/2018  2D echo 11/07/2018   Antibiotics: Anti-infectives (From admission, onward)   Start  Dose/Rate Route Frequency Ordered Stop   November 29, 2018 1800  levofloxacin (LEVAQUIN) tablet 500 mg     500 mg Oral  Every 48 hours 11/09/18 1457 2018-11-29 2000   11/08/18 1800  levofloxacin (LEVAQUIN) tablet 750 mg  Status:  Discontinued     750 mg Oral Every 48 hours 11/08/18 0841 11/09/18 1457   11/06/18 1930  levofloxacin (LEVAQUIN) IVPB 750 mg  Status:  Discontinued     750 mg 100 mL/hr over 90 Minutes Intravenous Every 48 hours 11/06/18 1901 11/08/18 0841   11/06/18 1900  levofloxacin (LEVAQUIN) IVPB 750 mg  Status:  Discontinued     750 mg 100 mL/hr over 90 Minutes Intravenous Every 24 hours 11/06/18 1852 11/06/18 1900     Subjective: Patient still complains of mild nausea.  She did have some bowel movement after enema yesterday.  Still complains of mild distention.  Objective: Vitals:   11/16/18 0921 11/16/18 1028  BP: (!) 168/76   Pulse: 90 84  Resp:  18  Temp:    SpO2:  99%    Intake/Output Summary (Last 24 hours) at 11/16/2018 1043 Last data filed at 11/16/2018 0600 Gross per 24 hour  Intake 400 ml  Output 300 ml  Net 100 ml   Filed Weights   11/14/18 0500 11/15/18 0500 11/16/18 0540  Weight: 70 kg 70.7 kg 71.9 kg   Body mass index is 28.08 kg/m.   Physical Exam: General: Alert awake and communicative.  Not in obvious distress.  On nasal cannula. HENT: Normocephalic, pupils equally reacting to light and accommodation.  No scleral pallor or icterus noted. Oral mucosa is moist.  Chest: Respirations noted bilaterally. CVS: S1 &S2 heard. No murmur.  Regular rate and rhythm. Abdomen: Soft, nontender, but appears slightly distended.  Bowel sounds are heard.  Liver is not palpable, no abdominal mass palpated Extremities: No cyanosis, clubbing or edema.  Peripheral pulses are palpable. Psych: Alert, awake and oriented, normal mood CNS:  No cranial nerve deficits.  Power equal in all extremities.  No sensory deficits noted.  No cerebellar signs.   Skin: Warm and dry.  No rashes noted.  Data Review: I have personally reviewed the following laboratory data and studies,  CBC: Recent  Labs  Lab 29-Nov-2018 0409 11/11/18 0345 11/12/18 0322 11/13/18 0245 11/14/18 0251 11/14/18 1226 11/15/18 0743  WBC 6.1 6.2 7.6 7.7 9.9 10.4 13.4*  NEUTROABS 5.4 5.4  --   --   --   --   --   HGB 10.1* 10.1* 10.7* 11.2* 11.6* 11.8* 12.0  HCT 32.8* 33.3* 35.2* 36.7 37.3 38.3 39.1  MCV 89.4 90.5 88.7 87.0 89.2 87.0 89.7  PLT 214 190 205 202 188 209 109   Basic Metabolic Panel: Recent Labs  Lab 11/12/18 1351 11/13/18 0245 11/14/18 0251 11/14/18 1226 11/15/18 0743  NA 136 139 138 137 137  K 5.4* 5.0 5.5* 5.2* 5.0  CL 102 103 102 98 98  CO2 26 26 26 27 29   GLUCOSE 328* 192* 209* 210* 150*  BUN 89* 84* 81* 82* 85*  CREATININE 2.33* 2.30* 2.16* 2.32* 2.41*  CALCIUM 8.9 8.9 8.8* 9.0 8.8*  MG 2.4  --   --   --   --    Liver Function Tests: No results for input(s): AST, ALT, ALKPHOS, BILITOT, PROT, ALBUMIN in the last 168 hours. No results for input(s): LIPASE, AMYLASE in the last 168 hours. No results for input(s): AMMONIA in the last 168 hours. Cardiac Enzymes: No results for  input(s): CKTOTAL, CKMB, CKMBINDEX, TROPONINI in the last 168 hours. BNP (last 3 results) Recent Labs    10/24/18 1912  BNP 45.6    ProBNP (last 3 results) Recent Labs    04/10/18 1715  PROBNP 37.0    CBG: Recent Labs  Lab 11/15/18 0754 11/15/18 1214 11/15/18 1522 11/15/18 2115 11/16/18 0744  GLUCAP 137* 276* 201* 130* 153*   Recent Results (from the past 240 hour(s))  Respiratory Panel by PCR     Status: Abnormal   Collection Time: 11/07/18  2:40 PM  Result Value Ref Range Status   Adenovirus NOT DETECTED NOT DETECTED Final   Coronavirus 229E NOT DETECTED NOT DETECTED Final    Comment: (NOTE) The Coronavirus on the Respiratory Panel, DOES NOT test for the novel  Coronavirus (2019 nCoV)    Coronavirus HKU1 NOT DETECTED NOT DETECTED Final   Coronavirus NL63 NOT DETECTED NOT DETECTED Final   Coronavirus OC43 NOT DETECTED NOT DETECTED Final   Metapneumovirus DETECTED (A) NOT  DETECTED Final   Rhinovirus / Enterovirus NOT DETECTED NOT DETECTED Final   Influenza A NOT DETECTED NOT DETECTED Final   Influenza B NOT DETECTED NOT DETECTED Final   Parainfluenza Virus 1 NOT DETECTED NOT DETECTED Final   Parainfluenza Virus 2 NOT DETECTED NOT DETECTED Final   Parainfluenza Virus 3 NOT DETECTED NOT DETECTED Final   Parainfluenza Virus 4 NOT DETECTED NOT DETECTED Final   Respiratory Syncytial Virus NOT DETECTED NOT DETECTED Final   Bordetella pertussis NOT DETECTED NOT DETECTED Final   Chlamydophila pneumoniae NOT DETECTED NOT DETECTED Final   Mycoplasma pneumoniae NOT DETECTED NOT DETECTED Final    Comment: Performed at [facility], [place] [address], [place], [place] [address]  MRSA PCR Screening     Status: None   Collection Time: 11/09/18  1:35 PM  Result Value Ref Range Status   MRSA by PCR NEGATIVE NEGATIVE Final    Comment:        The GeneXpert MRSA Assay (FDA approved for NASAL specimens only), is one component of a comprehensive MRSA colonization surveillance program. It is not intended to diagnose MRSA infection nor to guide or monitor treatment for MRSA infections. Performed at [facility], [place] [address] [place], [place] [address]      Studies: Dg Abd 2 Views  Result Date: 11/15/2018 CLINICAL DATA:  Ileus EXAM: ABDOMEN - 2 VIEW COMPARISON:  Yesterday FINDINGS: Continued generalized small bowel and colonic distension. There are fluid levels on the decubitus view without pneumoperitoneum. Superimposed ovoid soft tissue density over the right lower quadrant IMPRESSION: Continued ileus. Electronically Signed   By: Monte Fantasia M.D.   On: 11/15/2018 07:36    Scheduled Meds: . bisacodyl  10 mg Rectal Daily  . diatrizoate meglumine-sodium  90 mL Per NG tube Once  . fluticasone  2 spray Each Nare Daily  . heparin  5,000 Units Subcutaneous Q8H  . hydrALAZINE  25 mg Oral Q8H  . insulin aspart  0-9 Units Subcutaneous TID WC  .  insulin glargine  10 Units Subcutaneous Daily  . ipratropium-albuterol  3 mL Nebulization BID  . labetalol  100 mg Oral BID  . loratadine  10 mg Oral Daily  . mouth rinse  15 mL Mouth Rinse BID  . methylPREDNISolone (SOLU-MEDROL) injection  40 mg Intravenous Q12H  . pantoprazole  40 mg Oral Daily  . polyethylene glycol  17 g Oral BID  . sodium chloride flush  3 mL Intravenous Q12H  .  tamsulosin  0.4 mg Oral QPC supper    Continuous Infusions:   Flora Lipps, MD  [facility] 11/16/2018

## 2018-11-17 ENCOUNTER — Inpatient Hospital Stay (HOSPITAL_COMMUNITY): Payer: Medicare Other

## 2018-11-17 DIAGNOSIS — J9611 Chronic respiratory failure with hypoxia: Secondary | ICD-10-CM

## 2018-11-17 DIAGNOSIS — K567 Ileus, unspecified: Secondary | ICD-10-CM

## 2018-11-17 LAB — BASIC METABOLIC PANEL
Anion gap: 13 (ref 5–15)
BUN: 83 mg/dL — ABNORMAL HIGH (ref 8–23)
CHLORIDE: 96 mmol/L — AB (ref 98–111)
CO2: 27 mmol/L (ref 22–32)
Calcium: 8.7 mg/dL — ABNORMAL LOW (ref 8.9–10.3)
Creatinine, Ser: 2.51 mg/dL — ABNORMAL HIGH (ref 0.44–1.00)
GFR calc Af Amer: 22 mL/min — ABNORMAL LOW (ref >60)
GFR calc non Af Amer: 19 mL/min — ABNORMAL LOW (ref >60)
Glucose, Bld: 144 mg/dL — ABNORMAL HIGH (ref 70–99)
Potassium: 5 mmol/L (ref 3.5–5.1)
SODIUM: 136 mmol/L (ref 135–145)

## 2018-11-17 LAB — CBC
HCT: 40.8 % (ref 36.0–46.0)
Hemoglobin: 12.1 g/dL (ref 12.0–15.0)
MCH: 26.3 pg (ref 26.0–34.0)
MCHC: 29.7 g/dL — ABNORMAL LOW (ref 30.0–36.0)
MCV: 88.7 fL (ref 80.0–100.0)
Platelets: 240 10*3/uL (ref 150–400)
RBC: 4.6 MIL/uL (ref 3.87–5.11)
RDW: 14.3 % (ref 11.5–15.5)
WBC: 17.1 10*3/uL — ABNORMAL HIGH (ref 4.0–10.5)
nRBC: 0 % (ref 0.0–0.2)

## 2018-11-17 LAB — GLUCOSE, CAPILLARY
GLUCOSE-CAPILLARY: 125 mg/dL — AB (ref 70–99)
Glucose-Capillary: 147 mg/dL — ABNORMAL HIGH (ref 70–99)
Glucose-Capillary: 176 mg/dL — ABNORMAL HIGH (ref 70–99)
Glucose-Capillary: 111 mg/dL — ABNORMAL HIGH (ref 70–99)

## 2018-11-17 MED ORDER — LABETALOL HCL 5 MG/ML IV SOLN
10.0000 mg | INTRAVENOUS | Status: DC | PRN
  Filled 2018-11-17: qty 4

## 2018-11-17 MED ORDER — METHYLPREDNISOLONE SODIUM SUCC 40 MG IJ SOLR
40.0000 mg | Freq: Every day | INTRAMUSCULAR | Status: DC
  Administered 2018-11-18 – 2018-11-21 (×4): 40 mg via INTRAVENOUS
  Filled 2018-11-17 (×4): qty 1

## 2018-11-17 MED ORDER — METOCLOPRAMIDE HCL 5 MG/ML IJ SOLN
10.0000 mg | Freq: Three times a day (TID) | INTRAMUSCULAR | Status: DC | PRN
  Administered 2018-11-17: 10 mg via INTRAVENOUS
  Filled 2018-11-17: qty 2

## 2018-11-17 MED ORDER — SODIUM CHLORIDE 0.9 % IV SOLN
INTRAVENOUS | Status: DC
  Administered 2018-11-17 – 2018-11-19 (×6): via INTRAVENOUS

## 2018-11-17 NOTE — Progress Notes (Signed)
Patient complained of nausea .Vomitted 100cc green liquid. Abdomen slighltly firm.. Patient refuses to get out of bed to use bedside commode or  To use toilet.Dr Louanne Belton aware.

## 2018-11-17 NOTE — Progress Notes (Signed)
Patient reports still having nausea after receiving Zofran.  Dr. Louanne Belton notified via text page.

## 2018-11-17 NOTE — Progress Notes (Signed)
RN gave Reglan for nausea/vomiting.  Patient vomited a large amount of brown liquid onto floor.  Emesis has strong odor of stool.  Dr. Louanne Belton notified via text page.

## 2018-11-17 NOTE — Progress Notes (Deleted)
Transferred  To room 1420 via bed. Report given to RN. Patient vomited prior to transfer.

## 2018-11-17 NOTE — Progress Notes (Addendum)
IV fluids started on patient per orders.  Patient educated on gastric tube ordered and npo diet order.  When asked about inserting gastric tube, patient stated, "No.  Not right now."  Will continue to educate.

## 2018-11-18 ENCOUNTER — Inpatient Hospital Stay (HOSPITAL_COMMUNITY): Payer: Medicare Other

## 2018-11-18 ENCOUNTER — Inpatient Hospital Stay: Payer: Medicare Other | Admitting: Primary Care

## 2018-11-18 LAB — CBC
HCT: 38.7 % (ref 36.0–46.0)
Hemoglobin: 11.7 g/dL — ABNORMAL LOW (ref 12.0–15.0)
MCH: 27.7 pg (ref 26.0–34.0)
MCHC: 30.2 g/dL (ref 30.0–36.0)
MCV: 91.5 fL (ref 80.0–100.0)
Platelets: 221 10*3/uL (ref 150–400)
RBC: 4.23 MIL/uL (ref 3.87–5.11)
RDW: 14.4 % (ref 11.5–15.5)
WBC: 19.4 10*3/uL — ABNORMAL HIGH (ref 4.0–10.5)
nRBC: 0 % (ref 0.0–0.2)

## 2018-11-18 LAB — BASIC METABOLIC PANEL
Anion gap: 10 (ref 5–15)
BUN: 90 mg/dL — ABNORMAL HIGH (ref 8–23)
CO2: 27 mmol/L (ref 22–32)
Calcium: 8.5 mg/dL — ABNORMAL LOW (ref 8.9–10.3)
Chloride: 101 mmol/L (ref 98–111)
Creatinine, Ser: 2.76 mg/dL — ABNORMAL HIGH (ref 0.44–1.00)
GFR calc Af Amer: 19 mL/min — ABNORMAL LOW (ref >60)
GFR calc non Af Amer: 17 mL/min — ABNORMAL LOW (ref >60)
Glucose, Bld: 94 mg/dL (ref 70–99)
Potassium: 4.3 mmol/L (ref 3.5–5.1)
Sodium: 138 mmol/L (ref 135–145)

## 2018-11-18 LAB — GLUCOSE, CAPILLARY
GLUCOSE-CAPILLARY: 82 mg/dL (ref 70–99)
Glucose-Capillary: 68 mg/dL — ABNORMAL LOW (ref 70–99)
Glucose-Capillary: 87 mg/dL (ref 70–99)
Glucose-Capillary: 96 mg/dL (ref 70–99)
Glucose-Capillary: 98 mg/dL (ref 70–99)

## 2018-11-18 LAB — MAGNESIUM: MAGNESIUM: 2.3 mg/dL (ref 1.7–2.4)

## 2018-11-18 MED ORDER — INSULIN ASPART 100 UNIT/ML ~~LOC~~ SOLN
0.0000 [IU] | Freq: Four times a day (QID) | SUBCUTANEOUS | Status: DC
  Administered 2018-11-19 – 2018-11-20 (×4): 1 [IU] via SUBCUTANEOUS
  Administered 2018-11-20: 2 [IU] via SUBCUTANEOUS
  Administered 2018-11-21 – 2018-11-28 (×6): 1 [IU] via SUBCUTANEOUS
  Administered 2018-11-29: 2 [IU] via SUBCUTANEOUS

## 2018-11-18 MED ORDER — METHOCARBAMOL 1000 MG/10ML IJ SOLN: 500.0000 mg | Freq: Three times a day (TID) | INTRAVENOUS | Status: DC | PRN

## 2018-11-18 MED ORDER — METHOCARBAMOL 1000 MG/10ML IJ SOLN
250.0000 mg | Freq: Once | INTRAVENOUS | Status: AC
  Administered 2018-11-18: 250 mg via INTRAVENOUS
  Filled 2018-11-18: qty 2.5

## 2018-11-18 MED ORDER — MORPHINE SULFATE (PF) 2 MG/ML IV SOLN
2.0000 mg | INTRAVENOUS | Status: DC | PRN
  Administered 2018-11-18 – 2018-11-24 (×8): 2 mg via INTRAVENOUS
  Filled 2018-11-18 (×9): qty 1

## 2018-11-18 MED ORDER — DEXTROSE 50 % IV SOLN
25.0000 mL | INTRAVENOUS | Status: AC
  Administered 2018-11-18: 25 mL via INTRAVENOUS

## 2018-11-18 MED ORDER — PHENOL 1.4 % MT LIQD
1.0000 | OROMUCOSAL | Status: DC | PRN
  Administered 2018-11-21: 1 via OROMUCOSAL
  Filled 2018-11-18: qty 177

## 2018-11-18 MED ORDER — PANTOPRAZOLE SODIUM 40 MG IV SOLR
40.0000 mg | Freq: Two times a day (BID) | INTRAVENOUS | Status: DC
  Administered 2018-11-18 – 2018-11-24 (×14): 40 mg via INTRAVENOUS
  Filled 2018-11-18 (×15): qty 40

## 2018-11-18 MED ORDER — DEXTROSE 50 % IV SOLN
INTRAVENOUS | Status: AC
  Filled 2018-11-18: qty 50

## 2018-11-18 NOTE — Progress Notes (Addendum)
Patient states pain in neck is 7/10 and muscles are tight. Paged Baltazar Najjar to change PO flexeril to IV robaxin. One dose 250mg  IV robaxin ordered and given. Will continue to monitor.

## 2018-11-18 NOTE — Care Management Important Message (Signed)
Important Message  Patient Details  Name: MIKITA LESMEISTER MRN: [id number] Date of Birth: 1946-04-08   Medicare Important Message Given:  Yes    Kerin Salen 11/18/2018, 12:43 [place] Message  Patient Details  Name: SHANEEN REESER MRN: [id number] Date of Birth: March 27, 1946   Medicare Important Message Given:  Yes    Kerin Salen 11/18/2018, 12:43 PM

## 2018-11-18 NOTE — Progress Notes (Signed)
Pt's daughter Benjamine Mola called to get update on pt status, also interested in getting a palliative consult, MD paged to make aware

## 2018-11-18 NOTE — Progress Notes (Signed)
PT Cancellation Note  Patient Details Name: Victoria Bird MRN: [id number] DOB: 02-05-1946   Cancelled Treatment:    Reason Eval/Treat Not Completed: Patient declined, no reason specified. Encouraged pt to at least try to work on bed mobility and transfers but she was not agreeable. Will check back another day.    Weston Anna, PT Acute Rehabilitation Services Pager: [phone] Office: [phone]

## 2018-11-18 NOTE — Progress Notes (Signed)
PROGRESS NOTE  Victoria Bird YCX:[id number] DOB: [birth year] DOA: 11/06/2018 PCP: Hoyt Koch, MD   LOS: 12 days   Brief narrative:  Patient is a 73 year old female history of chronic respiratory failure on 2 L nasal cannula chronically secondary to severe COPD with very severe obstruction on spirometry of July 2019, chronic kidney disease stage III- 4, former tobacco user quit in 2019, hypertension presented to the ED with worsening shortness of breath and was admitted for an acute COPD exacerbation.  Patient on admission was placed in the stepdown unit and placed on BiPAP, IV steroids, scheduled nebulizer treatments.  Patient subsequently improved was taken off BiPAP and transferred to the telemetry floor.  Patient still with ongoing poor air movement.  On 11/29/2018 patient with worsening shortness of breath, use of accessory muscles of respiration, and subsequently transferred back to the stepdown unit to be placed on BiPAP. During stepdown stay, patient did have constipation and ileus.  Surgery was consulted.  Surgery recommended conservative treatment but patient had compliance issues.  Assessment/Plan:  Principal Problem:   Acute on chronic respiratory failure with hypoxia and hypercapnia ([name]) Active Problems:   Hypertension   GERD (gastroesophageal reflux disease)   OSA (obstructive sleep apnea)   Diabetes mellitus type 2, controlled ([name])   COPD exacerbation ([name])   Tobacco abuse   COPD with acute exacerbation ([name])   Chronic respiratory failure with hypoxia ([name])   Acute exacerbation of chronic obstructive pulmonary disease (COPD) ([name])   Malnutrition of moderate degree   ARF (acute renal failure) ([name])   Hyperkalemia   Abdominal discomfort  Abdominal distention, constipation.  Likely secondary to ileus.  Patient continued to vomit with abdominal distention so patient was kept n.p.o., IV fluids were reinitiated.  Patient finally agreed on NG tube placement with  intermittent suction with significant amount of fluid drained via the NG tube.  We will continue with NG tube today.  Ice chips only..  Abdominal x-ray was done which showed findings suggestive ileus.  Did have an enema with mild bowel movement.  An extensive discussion with the patient regarding the goals of care.  I have encouraged her to participate with physical therapy and ambulate if possible.  Acute on chronic hypoxic, hypercapnic respiratory failure secondary to COPD exacerbation.  Respiratory viral panel was positive for metapneumovirus.  Continue nebulizer oxygen supplementation.  Steroids have been tapered.  We will continue to taper as clinically indicated.  Leukocytosis.  Likely secondary to steroids.  Continue to monitor.  History of GERD.  Continue PPI.  Acute kidney injury on chronic kidney disease stage III.    Renal ultrasound showed echogenic kidneys.  No hydronephrosis.   Off Foley cath.  Continue level slightly trending up.  We will continue on IV fluids.    Moderate protein calorie malnutrition Patient will be kept n.p.o. again today.  Continue on IV fluid hydration.  Put the patient on D5 normal saline at this time.  Hypertension Continue hydralazine, labetalol.  On Flomax.   Anemia of chronic disease Stable at this time.  Closely monitor CBC.  Diabetes mellitus type 2 Hemoglobin A1c 6.1 on 11/08/2018.  On Lantus and sliding scale insulin.  Overall glycemic control is adequate, will be kept n.p.o. so we will change the sliding scale to every 6 hourly.  Urinary retention Improved but needed intermittent catheterization today.  Might need repeat catheterization again if feels to urinate.   Hyperkalemia Improved, decrease steroids  Noncompliance to treatment plan.  Intermittent.  Not  motivated for physical therapy  Debility, deconditioning. Patient has been seen by physical therapy who recommended skilled nursing facility placement but patient has refused.  I  again spoke with the patient about it today.   VTE Prophylaxis: Heparin subcu  Code Status: Full code  Family Communication: I tried to call the  patient's daughter Ms Ronnald Ramp on the phone to update her about the clinical condition of the patient but was unable to reach her today.  Disposition Plan: Likely to home with home health services in 2 to 3 days., will try to ambulate the patient.    Consultants:  Surgery, PCCM  Procedures:  Chest x-ray 11/08/2018, 11/06/2018  VQ scan 11/08/2018  Renal ultrasound 11/09/2018  2D echo 11/07/2018   Antibiotics: Anti-infectives (From admission, onward)   Start     Dose/Rate Route Frequency Ordered Stop   11/10/18 1800  levofloxacin (LEVAQUIN) tablet 500 mg     500 mg Oral Every 48 hours 11/09/18 1457 11/10/18 2000   11/08/18 1800  levofloxacin (LEVAQUIN) tablet 750 mg  Status:  Discontinued     750 mg Oral Every 48 hours 11/08/18 0841 11/09/18 1457   11/06/18 1930  levofloxacin (LEVAQUIN) IVPB 750 mg  Status:  Discontinued     750 mg 100 mL/hr over 90 Minutes Intravenous Every 48 hours 11/06/18 1901 11/08/18 0841   11/06/18 1900  levofloxacin (LEVAQUIN) IVPB 750 mg  Status:  Discontinued     750 mg 100 mL/hr over 90 Minutes Intravenous Every 24 hours 11/06/18 1852 11/06/18 1900     Subjective: Patient had significant amount of nausea and vomiting yesterday so was kept n.p.o. and IV fluids were initiated with NG tube.  Patient did have significant drainage from the NG tube today and feels a little better with nausea.  Objective: Vitals:   11/17/18 2231 11/18/18 0459  BP: 140/73 124/82  Pulse: 95 97  Resp: (!) 24 (!) 22  Temp: 98.6 F (37 C) 98 F (36.7 C)  SpO2: 99% 97%    Intake/Output Summary (Last 24 hours) at 11/18/2018 0954 Last data filed at 11/18/2018 0726 Gross per 24 hour  Intake 1301.57 ml  Output 1950 ml  Net -648.43 ml   Filed Weights   11/16/18 0540 11/17/18 0500 11/18/18 0459  Weight: 71.9 kg 70.4 kg 68.6 kg    Body mass index is 26.79 kg/m.   Physical Exam: General:  Average built, not in obvious distress.  On nasal cannula.  NG tube in place with significant drainage. HENT: Normocephalic, pupils equally reacting to light and accommodation.  No scleral pallor or icterus noted. Oral mucosa is moist.  Chest:   Diminished breath sounds bilaterally.  No definite wheezes noted CVS: S1 &S2 heard. No murmur.  Regular rate and rhythm. Abdomen: Soft, nontender but grossly distended..  Liver is not palpable, no abdominal mass palpated Extremities: No cyanosis, clubbing or edema.  Peripheral pulses are palpable. Psych: Alert, awake and communicative today.  Normal mood CNS:  No cranial nerve deficits.  Power equal in all extremities.  No sensory deficits noted.  No cerebellar signs.   Skin: Warm and dry.  No rashes noted.  Data Review: I have personally reviewed the following laboratory data and studies,  CBC: Recent Labs  Lab 11/14/18 0251 11/14/18 1226 11/15/18 0743 11/17/18 0356 11/18/18 0500  WBC 9.9 10.4 13.4* 17.1* 19.4*  HGB 11.6* 11.8* 12.0 12.1 11.7*  HCT 37.3 38.3 39.1 40.8 38.7  MCV 89.2 87.0 89.7 88.7 91.5  PLT 188  209 212 240 270   Basic Metabolic Panel: Recent Labs  Lab 11/12/18 1351  11/14/18 0251 11/14/18 1226 11/15/18 0743 11/17/18 0356 11/18/18 0500  NA 136   < > 138 137 137 136 138  K 5.4*   < > 5.5* 5.2* 5.0 5.0 4.3  CL 102   < > 102 98 98 96* 101  CO2 26   < > 26 27 29 27 27   GLUCOSE 328*   < > 209* 210* 150* 144* 94  BUN 89*   < > 81* 82* 85* 83* 90*  CREATININE 2.33*   < > 2.16* 2.32* 2.41* 2.51* 2.76*  CALCIUM 8.9   < > 8.8* 9.0 8.8* 8.7* 8.5*  MG 2.4  --   --   --   --   --  2.3   < > = values in this interval not displayed.   Liver Function Tests: No results for input(s): AST, ALT, ALKPHOS, BILITOT, PROT, ALBUMIN in the last 168 hours. No results for input(s): LIPASE, AMYLASE in the last 168 hours. No results for input(s): AMMONIA in the last 168  hours. Cardiac Enzymes: No results for input(s): CKTOTAL, CKMB, CKMBINDEX, TROPONINI in the last 168 hours. BNP (last 3 results) Recent Labs    10/24/18 1912  BNP 45.6    ProBNP (last 3 results) Recent Labs    04/10/18 1715  PROBNP 37.0    CBG: Recent Labs  Lab 11/17/18 0743 11/17/18 1149 11/17/18 1652 11/17/18 2308 11/18/18 0746  GLUCAP 147* 176* 125* 111* 98   Recent Results (from the past 240 hour(s))  MRSA PCR Screening     Status: None   Collection Time: 11/09/18  1:35 PM  Result Value Ref Range Status   MRSA by PCR NEGATIVE NEGATIVE Final    Comment:        The GeneXpert MRSA Assay (FDA approved for NASAL specimens only), is one component of a comprehensive MRSA colonization surveillance program. It is not intended to diagnose MRSA infection nor to guide or monitor treatment for MRSA infections. Performed at [facility], [place] [address], [place], [place] [address]      Studies: Dg Abd 1 View  Result Date: 11/17/2018 CLINICAL DATA:  Constipation, ileus, vomiting. Best obtainable images, pt actively vomiting and had difficulty with positioning of x-ray. H/o Diverticulitis. EXAM: ABDOMEN - 1 VIEW COMPARISON:  Radiograph 11/15/2018 FINDINGS: Persistent gas distended loops of small bowel measuring between 4 and 5 cm. The degree of distention is similar to comparison exam 2 days prior. Findings may be slightly improved from radiograph 11/14/2018. There is small amount of gas and stool in the rectum. Stool in the ascending colon. IMPRESSION: Potential mild improvement in small bowel pattern. Favor ileus over obstruction. Recommend clinical correlation for signs obstruction. Significant gaseous distention of the small bowel remains. Electronically Signed   By: Suzy Bouchard M.D.   On: 11/17/2018 15:07   Dg Abd Portable 1v  Result Date: 11/18/2018 CLINICAL DATA:  Repositioned gastric tube. EXAM: PORTABLE ABDOMEN - 1 VIEW COMPARISON:  11/17/2018  FINDINGS: The tip and side port of a gastric tube are now seen in the expected location of the distal stomach. Dilated small bowel loops are redemonstrated in the included upper abdomen in keeping with the patient's known high-grade partial or early SBO. Heart size and mediastinal contours are stable with ectatic slightly aneurysmal aortic arch. Lungs are relatively clear. Cholecystectomy clips are seen in the right upper quadrant. IMPRESSION: The tip and  side port of a gastric tube are now seen in the expected location of the distal stomach. Abnormal small bowel dilatation consistent with partial or early high-grade small bowel obstruction. Electronically Signed   By: Ashley Royalty M.D.   On: 11/18/2018 03:00   Dg Abd Portable 1v  Result Date: 11/17/2018 CLINICAL DATA:  NG tube placement EXAM: PORTABLE ABDOMEN - 1 VIEW COMPARISON:  Abdomen radiographs from 1108 hours on the same day FINDINGS: The side port of a gastric tube is approximately 6 cm above the GE juncture and further advancement of the gastric tube by at least 10 cm is recommended. Dilated small bowel loops are again demonstrated. No free air is seen. Borderline cardiomegaly with tortuous atherosclerotic aorta. No effusion or pulmonary consolidation. No overt pulmonary edema. No acute osseous abnormality. IMPRESSION: Further advancement of the gastric tube by at least 10 cm is recommended as the side-port is noted above the GE junction by at least 6 cm. Dilated small bowel loops in keeping with small bowel obstruction. Electronically Signed   By: Ashley Royalty M.D.   On: 11/17/2018 23:21    Scheduled Meds: . bisacodyl  10 mg Rectal Daily  . diatrizoate meglumine-sodium  90 mL Per NG tube Once  . fluticasone  2 spray Each Nare Daily  . heparin  5,000 Units Subcutaneous Q8H  . insulin aspart  0-9 Units Subcutaneous TID WC  . insulin glargine  10 Units Subcutaneous Daily  . loratadine  10 mg Oral Daily  . mouth rinse  15 mL Mouth Rinse BID  .  methylPREDNISolone (SOLU-MEDROL) injection  40 mg Intravenous Daily  . pantoprazole  40 mg Oral Daily  . polyethylene glycol  17 g Oral BID  . sodium chloride flush  3 mL Intravenous Q12H  . tamsulosin  0.4 mg Oral QPC supper    Continuous Infusions: . sodium chloride 125 mL/hr at 11/18/18 0840     Flora Lipps, MD  [facility] 11/18/2018

## 2018-11-18 NOTE — Progress Notes (Addendum)
Patient states pain in back after IV morphine and IV robaxin is still a 7/10. Paged Baltazar Najjar. IV morphine order changed from 1 to 2 mg every 4 hours. Will continue to monitor.

## 2018-11-19 LAB — CBC
HCT: 35 % — ABNORMAL LOW (ref 36.0–46.0)
Hemoglobin: 10.6 g/dL — ABNORMAL LOW (ref 12.0–15.0)
MCH: 28 pg (ref 26.0–34.0)
MCHC: 30.3 g/dL (ref 30.0–36.0)
MCV: 92.3 fL (ref 80.0–100.0)
PLATELETS: 187 10*3/uL (ref 150–400)
RBC: 3.79 MIL/uL — ABNORMAL LOW (ref 3.87–5.11)
RDW: 14.5 % (ref 11.5–15.5)
WBC: 13.9 10*3/uL — ABNORMAL HIGH (ref 4.0–10.5)
nRBC: 0 % (ref 0.0–0.2)

## 2018-11-19 LAB — BASIC METABOLIC PANEL
Anion gap: 6 (ref 5–15)
BUN: 58 mg/dL — ABNORMAL HIGH (ref 8–23)
CO2: 26 mmol/L (ref 22–32)
CREATININE: 1.9 mg/dL — AB (ref 0.44–1.00)
Calcium: 8.3 mg/dL — ABNORMAL LOW (ref 8.9–10.3)
Chloride: 112 mmol/L — ABNORMAL HIGH (ref 98–111)
GFR calc Af Amer: 30 mL/min — ABNORMAL LOW (ref >60)
GFR, EST NON AFRICAN AMERICAN: 26 mL/min — AB (ref >60)
Glucose, Bld: 124 mg/dL — ABNORMAL HIGH (ref 70–99)
Potassium: 4 mmol/L (ref 3.5–5.1)
Sodium: 144 mmol/L (ref 135–145)

## 2018-11-19 LAB — GLUCOSE, CAPILLARY
Glucose-Capillary: 108 mg/dL — ABNORMAL HIGH (ref 70–99)
Glucose-Capillary: 132 mg/dL — ABNORMAL HIGH (ref 70–99)
Glucose-Capillary: 138 mg/dL — ABNORMAL HIGH (ref 70–99)
Glucose-Capillary: 145 mg/dL — ABNORMAL HIGH (ref 70–99)
Glucose-Capillary: 70 mg/dL (ref 70–99)

## 2018-11-19 MED ORDER — DEXTROSE-NACL 5-0.9 % IV SOLN
INTRAVENOUS | Status: DC
  Administered 2018-11-19 – 2018-11-20 (×3): via INTRAVENOUS

## 2018-11-19 NOTE — Progress Notes (Signed)
Hypoglycemic Event  CBG: 68  Treatment: D50 25 mL (12.5 gm)  Symptoms: Pale  Follow-up CBG: Time:0000 CBG Result:145  Possible Reasons for Event: Inadequate meal intake  Comments/MD notified:Kirby    Victoria Bird N

## 2018-11-19 NOTE — Care Management Note (Signed)
Case Management Note  Patient Details  Name: Victoria Bird MRN: [id number] Date of Birth: 23-Jan-1946  Subjective/Objective: Acute on chronic resp failure. From home w/family support-Pam primary caregiver(child patient raised) PT-[facility] SNF. Patient declines SNF & HHC-"I have enough help @ home". Patient d/c plan is home. Has own transp home.                   Action/Plan:dc plan home.   Expected Discharge Date:  (unknown)               Expected Discharge Plan:  Home/Self Care  In-House Referral:     Discharge planning Services  CM Consult  Post Acute Care Choice:  Durable Medical Equipment(rw,3n1,[facility] home 02-has travel tank) Choice offered to:  Patient  DME Arranged:    DME Agency:     HH Arranged:  (Patient Refused [facility]) [place] Agency:     Status of Service:  Completed, signed off  If discussed at [place] of Stay Meetings, dates discussed:    Additional Comments:  [name], RN 11/19/2018, 11:43 AM

## 2018-11-19 NOTE — Progress Notes (Signed)
Victoria Bird, NT bladder scanned patient- 313ml shown. Patient not I&O catherized d/t order. Will continue to monitor.

## 2018-11-19 NOTE — Progress Notes (Signed)
PROGRESS NOTE  Victoria Bird JGG:[id number] DOB: [birth year] DOA: 11/06/2018 PCP: Hoyt Koch, MD   LOS: 13 days   Brief narrative:  Patient is a 73 year old female history of chronic respiratory failure on 2 L nasal cannula chronically secondary to severe COPD with very severe obstruction on spirometry of July 2019, chronic kidney disease stage III- 4, former tobacco user quit in 2019, hypertension presented to the ED with worsening shortness of breath and was admitted for an acute COPD exacerbation.  Patient on admission was placed in the stepdown unit and placed on BiPAP, IV steroids, scheduled nebulizer treatments.  Patient subsequently improved was taken off BiPAP and transferred to the telemetry floor.  Patient still with ongoing poor air movement.  On 11/29/2018 patient with worsening shortness of breath, use of accessory muscles of respiration, and subsequently transferred back to the stepdown unit to be placed on BiPAP. During stepdown stay, patient did have constipation and ileus.  Surgery was consulted.  Surgery recommended conservative treatment but patient had compliance issues.  Subsequently patient was transferred out of the stepdown unit but developed more abdominal distention and vomiting.  She was then placed on a NG tube with intermittent suction with mild improvement in her symptoms.  Family requested palliative care evaluation for her ongoing symptoms.  Assessment/Plan:  Principal Problem:   Acute on chronic respiratory failure with hypoxia and hypercapnia ([name]) Active Problems:   Hypertension   GERD (gastroesophageal reflux disease)   OSA (obstructive sleep apnea)   Diabetes mellitus type 2, controlled ([name])   COPD exacerbation ([name])   Tobacco abuse   COPD with acute exacerbation ([name])   Chronic respiratory failure with hypoxia ([name])   Acute exacerbation of chronic obstructive pulmonary disease (COPD) ([name])   Malnutrition of moderate degree   ARF (acute  renal failure) ([name])   Hyperkalemia   Abdominal discomfort  Abdominal distention, constipation.  Likely secondary to ileus.  Symptomatically better with NG tube with less abdominal distention and less nausea but has not had a bowel movement, will continue on IV fluids NG tube intermittent suction.  Continue on D5 water normal saline.  Check Abdominal x-ray today.  She had been seen by surgery who signed off on the patient because of lack of compliance to contrast study, enema and other treatments.  Consider CT scan if her condition continues to deteriorate.  Might need to consider alternate ways of nutrition including TPN if her ileus is prolonged.  Palliative care has been consulted to define goals of care and address ongoing issues as per the daughter's request.  Acute on chronic hypoxic, hypercapnic respiratory failure secondary to COPD exacerbation.  Improved.  Respiratory viral panel was positive for metapneumovirus.  Continue nebulizer oxygen supplementation.  Continue IV steroids for now since NPO.  Overall respiratory status has improved.  Leukocytosis.  Likely secondary to steroids.  Continue to monitor.  Trending down.  History of GERD.  Continue PPI.  Acute kidney injury on chronic kidney disease stage III.    Renal ultrasound showed echogenic kidneys.  No hydronephrosis.   Check BMP.  Trending down creatinine levels.  Moderate protein calorie malnutrition Patient will be n.p.o. again.  Continue on D5 water.  Hypertension Continue with hydralazine as needed for hypertension.  On Flomax.  Unable to use PO at this time.  Anemia of chronic disease Stable at this time.  Closely monitor CBC.  Diabetes mellitus type 2 Hemoglobin A1c 6.1 on 11/08/2018.  On Lantus and sliding scale insulin every 6  hourly.  Closely monitor blood glucose levels.  Urinary retention: We will need to closely monitor.  Recently had a Foley catheter   Hyperkalemia: Improved.  Check BMP in  a.m.  Noncompliance to treatment plan.  Intermittent.  Not motivated for physical therapy. I have been continually encouraging the patient for participation.  Debility, deconditioning.  Patient has not been ambulating and participating with physical therapy as expected.  She was encouraged for it.  VTE Prophylaxis: Heparin subcu  Code Status: Full code  Family Communication:  Spoke with the patient's daughter on the phone and updated her about the clinical condition of the patient.  Disposition Plan:   Consultants:  Surgery, PCCM  Procedures:  Chest x-ray 11/08/2018, 11/06/2018  VQ scan 11/08/2018  Renal ultrasound 11/09/2018  2D echo 11/07/2018   Antibiotics: Anti-infectives (From admission, onward)   Start     Dose/Rate Route Frequency Ordered Stop   11/10/18 1800  levofloxacin (LEVAQUIN) tablet 500 mg     500 mg Oral Every 48 hours 11/09/18 1457 11/10/18 2000   11/08/18 1800  levofloxacin (LEVAQUIN) tablet 750 mg  Status:  Discontinued     750 mg Oral Every 48 hours 11/08/18 0841 11/09/18 1457   11/06/18 1930  levofloxacin (LEVAQUIN) IVPB 750 mg  Status:  Discontinued     750 mg 100 mL/hr over 90 Minutes Intravenous Every 48 hours 11/06/18 1901 11/08/18 0841   11/06/18 1900  levofloxacin (LEVAQUIN) IVPB 750 mg  Status:  Discontinued     750 mg 100 mL/hr over 90 Minutes Intravenous Every 24 hours 11/06/18 1852 11/06/18 1900     Subjective: Patient feels a little better with nausea.  Denies any abdominal pain.  On  NG tube.  Had significant NG output.  Objective: Vitals:   11/19/18 0442 11/19/18 0808  BP: 132/65 (!) 152/71  Pulse: 81 91  Resp: 18 18  Temp: 97.9 F (36.6 C) 97.7 F (36.5 C)  SpO2: 100% 96%    Intake/Output Summary (Last 24 hours) at 11/19/2018 1118 Last data filed at 11/19/2018 0653 Gross per 24 hour  Intake 3046.17 ml  Output 2525 ml  Net 521.17 ml   Filed Weights   11/17/18 0500 11/18/18 0459 11/19/18 0444  Weight: 70.4 kg 68.6 kg 70.3 kg    Body mass index is 27.45 kg/m.   Physical Exam: General: Alert awake and communicative.  On nasal cannula.  NG tube in place. HENT: Normocephalic, pupils equally reacting to light and accommodation.  No scleral pallor or icterus noted. Oral mucosa is moist.  Chest:  Clear breath sounds.  Diminished breath sounds bilaterally. No crackles or wheezes.  CVS: S1 &S2 heard. No murmur.  Regular rate and rhythm. Abdomen: Soft, nontender, less distended today.  Bowel sounds are heard.   Extremities: No cyanosis, clubbing or edema.  Peripheral pulses are palpable. Psych: Alert, awake and oriented, normal mood CNS:  No cranial nerve deficits.  Power equal in all extremities.  No sensory deficits noted.  No cerebellar signs.   Skin: Warm and dry.  No rashes noted.   Data Review: I have personally reviewed the following laboratory data and studies,  CBC: Recent Labs  Lab 11/14/18 0251 11/14/18 1226 11/15/18 0743 11/17/18 0356 11/18/18 0500  WBC 9.9 10.4 13.4* 17.1* 19.4*  HGB 11.6* 11.8* 12.0 12.1 11.7*  HCT 37.3 38.3 39.1 40.8 38.7  MCV 89.2 87.0 89.7 88.7 91.5  PLT 188 209 212 240 329   Basic Metabolic Panel: Recent Labs  Lab 11/12/18  1351  11/14/18 0251 11/14/18 1226 11/15/18 0743 11/17/18 0356 11/18/18 0500  NA 136   < > 138 137 137 136 138  K 5.4*   < > 5.5* 5.2* 5.0 5.0 4.3  CL 102   < > 102 98 98 96* 101  CO2 26   < > 26 27 29 27 27   GLUCOSE 328*   < > 209* 210* 150* 144* 94  BUN 89*   < > 81* 82* 85* 83* 90*  CREATININE 2.33*   < > 2.16* 2.32* 2.41* 2.51* 2.76*  CALCIUM 8.9   < > 8.8* 9.0 8.8* 8.7* 8.5*  MG 2.4  --   --   --   --   --  2.3   < > = values in this interval not displayed.   Liver Function Tests: No results for input(s): AST, ALT, ALKPHOS, BILITOT, PROT, ALBUMIN in the last 168 hours. No results for input(s): LIPASE, AMYLASE in the last 168 hours. No results for input(s): AMMONIA in the last 168 hours. Cardiac Enzymes: No results for input(s):  CKTOTAL, CKMB, CKMBINDEX, TROPONINI in the last 168 hours. BNP (last 3 results) Recent Labs    10/24/18 1912  BNP 45.6    ProBNP (last 3 results) Recent Labs    04/10/18 1715  PROBNP 37.0    CBG: Recent Labs  Lab 11/18/18 1602 11/18/18 2145 11/18/18 2331 11/19/18 0000 11/19/18 0645  GLUCAP 87 82 68* 145* 70   Recent Results (from the past 240 hour(s))  MRSA PCR Screening     Status: None   Collection Time: 11/09/18  1:35 PM  Result Value Ref Range Status   MRSA by PCR NEGATIVE NEGATIVE Final    Comment:        The GeneXpert MRSA Assay (FDA approved for NASAL specimens only), is one component of a comprehensive MRSA colonization surveillance program. It is not intended to diagnose MRSA infection nor to guide or monitor treatment for MRSA infections. Performed at [facility], [place] [address] [place], [place] [address]      Studies: Dg Abd 1 View  Result Date: 11/17/2018 CLINICAL DATA:  Constipation, ileus, vomiting. Best obtainable images, pt actively vomiting and had difficulty with positioning of x-ray. H/o Diverticulitis. EXAM: ABDOMEN - 1 VIEW COMPARISON:  Radiograph 11/15/2018 FINDINGS: Persistent gas distended loops of small bowel measuring between 4 and 5 cm. The degree of distention is similar to comparison exam 2 days prior. Findings may be slightly improved from radiograph 11/14/2018. There is small amount of gas and stool in the rectum. Stool in the ascending colon. IMPRESSION: Potential mild improvement in small bowel pattern. Favor ileus over obstruction. Recommend clinical correlation for signs obstruction. Significant gaseous distention of the small bowel remains. Electronically Signed   By: Suzy Bouchard M.D.   On: 11/17/2018 15:07   Dg Abd Portable 1v  Result Date: 11/18/2018 CLINICAL DATA:  Repositioned gastric tube. EXAM: PORTABLE ABDOMEN - 1 VIEW COMPARISON:  11/17/2018 FINDINGS: The tip and side port of a gastric tube are  now seen in the expected location of the distal stomach. Dilated small bowel loops are redemonstrated in the included upper abdomen in keeping with the patient's known high-grade partial or early SBO. Heart size and mediastinal contours are stable with ectatic slightly aneurysmal aortic arch. Lungs are relatively clear. Cholecystectomy clips are seen in the right upper quadrant. IMPRESSION: The tip and side port of a gastric tube are now seen in the expected location of  the distal stomach. Abnormal small bowel dilatation consistent with partial or early high-grade small bowel obstruction. Electronically Signed   By: Ashley Royalty M.D.   On: 11/18/2018 03:00   Dg Abd Portable 1v  Result Date: 11/17/2018 CLINICAL DATA:  NG tube placement EXAM: PORTABLE ABDOMEN - 1 VIEW COMPARISON:  Abdomen radiographs from 1108 hours on the same day FINDINGS: The side port of a gastric tube is approximately 6 cm above the GE juncture and further advancement of the gastric tube by at least 10 cm is recommended. Dilated small bowel loops are again demonstrated. No free air is seen. Borderline cardiomegaly with tortuous atherosclerotic aorta. No effusion or pulmonary consolidation. No overt pulmonary edema. No acute osseous abnormality. IMPRESSION: Further advancement of the gastric tube by at least 10 cm is recommended as the side-port is noted above the GE junction by at least 6 cm. Dilated small bowel loops in keeping with small bowel obstruction. Electronically Signed   By: Ashley Royalty M.D.   On: 11/17/2018 23:21    Scheduled Meds: . bisacodyl  10 mg Rectal Daily  . dextrose      . fluticasone  2 spray Each Nare Daily  . heparin  5,000 Units Subcutaneous Q8H  . insulin aspart  0-9 Units Subcutaneous Q6H  . insulin glargine  10 Units Subcutaneous Daily  . mouth rinse  15 mL Mouth Rinse BID  . methylPREDNISolone (SOLU-MEDROL) injection  40 mg Intravenous Daily  . pantoprazole (PROTONIX) IV  40 mg Intravenous Q12H  .  sodium chloride flush  3 mL Intravenous Q12H  . tamsulosin  0.4 mg Oral QPC supper    Continuous Infusions: . sodium chloride 125 mL/hr at 11/19/18 0841     Flora Lipps, MD  [facility] 11/19/2018

## 2018-11-19 NOTE — Progress Notes (Signed)
Occupational Therapy Treatment Patient Details Name: Victoria Bird MRN: [id number] DOB: 1946/01/20 Today's Date: 11/19/2018    History of present illness 73 year old female history of chronic respiratory failure on 2 L nasal cannula chronically secondary to severe COPD with very severe obstruction on spirometry of July 2019, chronic kidney disease stage III versus 4, former tobacco user quit in 2019, hypertension presented to the ED with worsening shortness of breath and admitted for an acute COPD exacerbation.  Patient on admission was placed in the stepdown unit and placed on BiPAP, IV steroids, scheduled nebulizer treatments.  Patient subsequently improved was taken off BiPAP and transferred to the telemetry floor.  Patient still with ongoing poor air movement.  On 11/09/2018 patient with worsening shortness of breath, use of accessory muscles of respiration, and subsequently transferred back to the stepdown unit to be placed on BiPAP. Pt with recent admission and discharge 1/23-1/27.    OT comments  Upon arrival, pt awake and supine in bed. Pt requiring increased encouragement for participation and perseverating on topic of getting coffee despite NPO. Pt requiring Mod A +2 for bed mobility to EOB and then Min Guard A for return to supine. Pt requiring Min A for UB bathing at EOB. Performing AAROM BLE exercises while sitting at EOB. Continue to recommend dc to SNF and will continue to follow acutely as admitted.     Follow Up Recommendations  SNF;Supervision/Assistance - 24 hour(pending pt progress)    Equipment Recommendations  Other (comment)(TBD in next venue)    Recommendations for Other Services      Precautions / Restrictions Precautions Precautions: Fall Precaution Comments: monitor O2 sats, pt on 2L O2 at baseline Restrictions Weight Bearing Restrictions: No       Mobility Bed Mobility Overal bed mobility: Needs Assistance Bed Mobility: Supine to Sit;Sit to Supine      Supine to sit: Mod assist;+2 for physical assistance;HOB elevated Sit to supine: Min guard   General bed mobility comments: Mod A for initating BLEs towards EOB and then elevating trunk. In returning to supine, Min Guard A for safety but not physical A needed.   Transfers                      Balance Overall balance assessment: Needs assistance Sitting-balance support: Feet supported;Bilateral upper extremity supported Sitting balance-Leahy Scale: Fair Sitting balance - Comments: not able to accept challenge                                    ADL either performed or assessed with clinical judgement   ADL Overall ADL's : Needs assistance/impaired Eating/Feeding: NPO       Upper Body Bathing: Minimal assistance;Sitting Upper Body Bathing Details (indicate cue type and reason): Min A for washing her back while sitting at EOB.            Lower Body Dressing Details (indicate cue type and reason): Max A for adjusting socks while seated at EOB               General ADL Comments: Pt requiring increased encouragement for participation     Vision       Perception     Praxis      Cognition Arousal/Alertness: Awake/alert Behavior During Therapy: [name] for tasks assessed/performed;Anxious Overall Cognitive Status: Within Functional Limits for tasks assessed  General Comments: Pt with self limiting behaviors and required increased cues for encouragement for participation. Pt perversating on topic of wanting to eat and wanting PT/OT to give her coffee. Pt stating "If you give me a fruit cocktale Ill move."        Exercises Exercises: General Lower Extremity General Exercises - Lower Extremity Ankle Circles/Pumps: AAROM;Both;10 reps;Supine Long Arc Quad: AAROM;Both;10 reps;Seated Hip Flexion/Marching: Both;10 reps;Seated;AAROM   Shoulder Instructions       General Comments SpO2 >90s throughout on  3L.     Pertinent Vitals/ Pain       Pain Assessment: Faces Faces Pain Scale: No hurt Pain Location: low back Pain Intervention(s): Monitored during session  Home Living                                          Prior Functioning/Environment              Frequency  Min 2X/week        Progress Toward Goals  OT Goals(current goals can now be found in the care plan section)  Progress towards OT goals: Progressing toward goals  Acute Rehab OT Goals Patient Stated Goal: breathe better  OT Goal Formulation: With patient Time For Goal Achievement: 11/23/18 Potential to Achieve Goals: Good ADL Goals Pt Will Perform Grooming: with modified independence;sitting Pt Will Perform Upper Body Dressing: with modified independence;sitting Pt Will Perform Lower Body Dressing: with modified independence;sitting/lateral leans;sit to/from stand Pt/caregiver will Perform Home Exercise Program: Increased strength;Both right and left upper extremity;With written HEP provided Additional ADL Goal #1: Pt will independently initiate/demonstrate at least 1 energy conservation technique during functional task.  Plan Discharge plan remains appropriate    Co-evaluation    PT/OT/SLP Co-Evaluation/Treatment: Yes Reason for Co-Treatment: Necessary to address cognition/behavior during functional activity   OT goals addressed during session: ADL's and self-care      AM-PAC OT "6 Clicks" Daily Activity     Outcome Measure   Help from another person eating meals?: None Help from another person taking care of personal grooming?: None Help from another person toileting, which includes using toliet, bedpan, or urinal?: Total Help from another person bathing (including washing, rinsing, drying)?: A Lot Help from another person to put on and taking off regular upper body clothing?: A Little Help from another person to put on and taking off regular lower body clothing?: A Lot 6 Click  Score: 16    End of Session Equipment Utilized During Treatment: Oxygen  OT Visit Diagnosis: Muscle weakness (generalized) (M62.81)   Activity Tolerance Patient limited by fatigue;Other (comment)(Self limiting behavior)   Patient Left in bed;with call bell/phone within reach;with bed alarm set   Nurse Communication Mobility status        Time: 5638-7564 OT Time Calculation (min): 23 min  Charges: OT General Charges $OT Visit: 1 Visit OT Treatments $Self Care/Home Management : 8-22 mins  [place], OTR/L Acute Rehab Pager: [phone] Office: [place] 11/19/2018, 11:46 AM

## 2018-11-19 NOTE — Progress Notes (Signed)
Physical Therapy Treatment Patient Details Name: NICLE CONNOLE MRN: [id number] DOB: March 06, 1946 Today's Date: 11/19/2018    History of Present Illness 73 year old female history of chronic respiratory failure on 2 L nasal cannula chronically secondary to severe COPD with very severe obstruction on spirometry of July 2019, chronic kidney disease stage III versus 4, former tobacco user quit in 2019, hypertension presented to the ED with worsening shortness of breath and admitted for an acute COPD exacerbation.  Patient on admission was placed in the stepdown unit and placed on BiPAP, IV steroids, scheduled nebulizer treatments.  Patient subsequently improved was taken off BiPAP and transferred to the telemetry floor.  Patient still with ongoing poor air movement.  On 11/09/2018 patient with worsening shortness of breath, use of accessory muscles of respiration, and subsequently transferred back to the stepdown unit to be placed on BiPAP. Pt with recent admission and discharge 1/23-1/27.     PT Comments    Patient seen to attempt mobility progress. Patient requiring max motivational cueing and education on importance of participating with therapies. At one time, stating she would only participate if she was allowed fruit cocktail and coffee with further education on MD orders for NPO and that PT/OT could not go against MD orders. Patient requires Mod A for bed mobility to come to EOB with AAROM at B hips, knees, ankles. Will continue to recommend SNF due to current functional status.    Follow Up Recommendations  SNF     Equipment Recommendations  None recommended by PT    Recommendations for Other Services       Precautions / Restrictions Precautions Precautions: Fall Precaution Comments: monitor O2 sats, pt on 2L O2 at baseline Restrictions Weight Bearing Restrictions: No    Mobility  Bed Mobility Overal bed mobility: Needs Assistance Bed Mobility: Supine to Sit;Sit to Supine      Supine to sit: Mod assist;+2 for physical assistance;HOB elevated Sit to supine: Min guard   General bed mobility comments: Mod A for initating BLEs towards EOB and then elevating trunk. In returning to supine, Min Guard A for safety but not physical A needed.   Transfers                 General transfer comment: patient refusing OOB mobility  Ambulation/Gait                 Stairs             Wheelchair Mobility    Modified Rankin (Stroke Patients Only)       Balance Overall balance assessment: Needs assistance Sitting-balance support: Feet supported;Bilateral upper extremity supported Sitting balance-Leahy Scale: Fair Sitting balance - Comments: not able to accept challenge                                     Cognition Arousal/Alertness: Awake/alert Behavior During Therapy: [facility] for tasks assessed/performed;Anxious Overall Cognitive Status: Within Functional Limits for tasks assessed                                 General Comments: Pt with self limiting behaviors and required increased cues for encouragement for participation. Pt perversating on topic of wanting to eat and wanting PT/OT to give her coffee. Pt stating "If you give me a fruit cocktale Ill move."      Exercises  General Exercises - Lower Extremity Ankle Circles/Pumps: AAROM;Both;10 reps;Supine Long Arc Quad: AAROM;Both;10 reps;Seated Hip Flexion/Marching: Both;10 reps;Seated;AAROM    General Comments General comments (skin integrity, edema, etc.): SpO2 on 3L via [place] - >90% throughout session      Pertinent Vitals/Pain Pain Assessment: Faces Faces Pain Scale: No hurt Pain Location: low back Pain Intervention(s): Monitored during session    Home Living                      Prior Function            PT Goals (current goals can now be found in the care plan section) Acute Rehab PT Goals Patient Stated Goal: breathe better  PT Goal  Formulation: With patient Time For Goal Achievement: 11/23/18 Potential to Achieve Goals: Good Progress towards PT goals: Progressing toward goals    Frequency    Min 2X/week      PT Plan Current plan remains appropriate    Co-evaluation PT/OT/SLP Co-Evaluation/Treatment: Yes Reason for Co-Treatment: Necessary to address cognition/behavior during functional activity;For patient/therapist safety;To address functional/ADL transfers PT goals addressed during session: Mobility/safety with mobility;Balance;Strengthening/ROM OT goals addressed during session: ADL's and self-care      AM-PAC PT "6 Clicks" Mobility   Outcome Measure  Help needed turning from your back to your side while in a flat bed without using bedrails?: A Little Help needed moving from lying on your back to sitting on the side of a flat bed without using bedrails?: A Lot Help needed moving to and from a bed to a chair (including a wheelchair)?: A Lot Help needed standing up from a chair using your arms (e.g., wheelchair or bedside chair)?: A Lot Help needed to walk in hospital room?: A Lot Help needed climbing 3-5 steps with a railing? : Total 6 Click Score: 12    End of Session Equipment Utilized During Treatment: Oxygen Activity Tolerance: Patient limited by fatigue(self-limiting) Patient left: in bed;with call bell/phone within reach;with bed alarm set Nurse Communication: Mobility status PT Visit Diagnosis: Muscle weakness (generalized) (M62.81);Other abnormalities of gait and mobility (R26.89)     Time: 8115-7262 PT Time Calculation (min) (ACUTE ONLY): 23 min  Charges:  $Therapeutic Activity: 8-22 mins                     Lanney Gins, PT, DPT Supplemental Physical Therapist 11/19/18 12:04 PM Pager: [phone] Office: [phone]

## 2018-11-19 NOTE — Progress Notes (Signed)
Bladder scanned patient - 446ml shown. I&O catherized patient per order - 375 output. Will continue to monitor.

## 2018-11-19 NOTE — Progress Notes (Signed)
Order was obtained to place Foley catheter but pt was able to void 400 ml prior to insertion

## 2018-11-19 NOTE — Consult Note (Signed)
Consultation Note Date: 11/19/2018   Patient Name: Victoria Bird  DOB: [birth year]  MRN: [id number]  Age / Sex: 73 y.o., female  PCP: Hoyt Koch, MD Referring Physician: Flora Lipps, MD  Reason for Consultation: Establishing goals of care  HPI/Patient Profile: 73 y.o. female admitted on 11/06/2018    Patient is a 73 year old female history of chronic respiratory failure on 2 L nasal cannula chronically secondary to severe COPD with very severe obstruction on spirometry of July 2019, chronic kidney disease stage III- 4, former tobacco user quit in 2019, hypertension.  She presented to the ED with worsening shortness of breath and was admitted for an acute COPD exacerbation. Patient on admission was placed in the stepdown unit and placed on BiPAP, IV steroids, scheduled nebulizer treatments.    During stepdown stay, patient did have constipation and ileus.  Surgery was consulted.  Surgery recommended conservative treatment but patient had compliance issues.  Subsequently patient was transferred out of the stepdown unit but developed more abdominal distention and vomiting.  She was then placed on a NG tube with intermittent suction with mild improvement in her symptoms.    Family requested palliative care evaluation for her ongoing symptoms.  Clinical Assessment and Goals of Care:  Patient is awake alert resting in bed. She stated that she lives at home with grand son and another family friend. She has not passed any gas, hasn't had a bowel movement. She says that her abdominal pain is better.   I introduced myself and palliative care as follows: Palliative medicine is specialized medical care for people living with serious illness. It focuses on providing relief from the symptoms and stress of a serious illness. The goal is to improve quality of life for both the patient and the  family.  Code status and goals of care discussions undertaken. Goals, wishes and values attempted to be elicited. Patient didn't elaborate or share much, she simply stated that she wants to go home as soon as she is able to.   Chart reviewed, it is noted that there's a recommendation for SNF after PT evaluation, how ever, patient wants to go home.   Call placed and discussed with daughter Victoria Bird. She states that the patient needs more help at home, especially after this hospitalization, she isn't sure if the patient can safely be at home. The patient has a son Victoria Bird and daughter Victoria Bird, in addition to her daughter Victoria Bird, who states that the patient doesn't want to discuss about her wishes or advanced directives. She has tried having the patient establish a HCPOA agent, how ever, patient has refused to discuss these matters and didn't want to designate a HCPOA agent. Offered active listening and supportive care to daughter.     NEXT OF KIN  as above.   SUMMARY OF RECOMMENDATIONS    full code, full scope for now.  May need ongoing discussions regarding disposition options and overall goals of care.  Will likely need CT abdomen if not improving.   Code  Status/Advance Care Planning:  Full code    Symptom Management:    as above   Palliative Prophylaxis:   Delirium Protocol  Additional Recommendations (Limitations, Scope, Preferences):  Full Scope Treatment  Psycho-social/Spiritual:   Desire for further Chaplaincy support:yes  Additional Recommendations: Caregiving  Support/Resources  Prognosis:   Unable to determine  Discharge Planning: To Be Determined      Primary Diagnoses: Present on Admission: . COPD exacerbation ([place]) . Acute exacerbation of chronic obstructive pulmonary disease (COPD) ([place]) . Chronic respiratory failure with hypoxia ([place]) . COPD with acute exacerbation ([place]) . GERD (gastroesophageal reflux disease) . Hypertension . OSA  (obstructive sleep apnea) . Tobacco abuse   I have reviewed the medical record, interviewed the patient and family, and examined the patient. The following aspects are pertinent.  Past Medical History:  Diagnosis Date  . Anxiety   . Chronic pain   . Colon polyps    adenomatous  . COPD (chronic obstructive pulmonary disease) ([place])   . Diabetes mellitus   . Diverticulitis   . Diverticulosis   . Emphysema lung ([place])   . Gall stones   . GERD (gastroesophageal reflux disease)   . H/O: pneumonia   . High cholesterol   . Hypertension   . OSA (obstructive sleep apnea)    no cpap  . Tracheobronchitis 01/01/2012   Social History   Socioeconomic History  . Marital status: Divorced    Spouse name: Not on file  . Number of children: 3  . Years of education: GED  . Highest education level: Not on file  Occupational History  . Occupation: Disability  Social Needs  . Financial resource strain: Not hard at all  . Food insecurity:    Worry: Never true    Inability: Never true  . Transportation needs:    Medical: No    Non-medical: No  Tobacco Use  . Smoking status: Former Smoker    Packs/day: 1.50    Years: 41.00    Pack years: 61.50    Types: Cigarettes    Start date: 03/19/1974    Last attempt to quit: 11/06/2017    Years since quitting: 1.0  . Smokeless tobacco: Never Used  . Tobacco comment: daily amount of cigarettes varies depending on stress level   Substance and Sexual Activity  . Alcohol use: No    Alcohol/week: 0.0 standard drinks  . Drug use: No  . Sexual activity: Never    Birth control/protection: Post-menopausal  Lifestyle  . Physical activity:    Days per week: 0 days    Minutes per session: 0 min  . Stress: Not at all  Relationships  . Social connections:    Talks on phone: More than three times a week    Gets together: More than three times a week    Attends religious service: 1 to 4 times per year    Active member of club or organization: Yes     Attends meetings of clubs or organizations: 1 to 4 times per year    Relationship status: Not on file  Other Topics Concern  . Not on file  Social History Narrative   Daughter Victoria Bird) [phone]   Originally from [place]. Previously lived in [place]. She has traveled from [name] to [place] & has also traveled across country to [place]. Multiple visits to [place], [place], [place], [name], [place]. She has a dog at home. No bird, mold, or hot tub exposure. She reports some years ago she did  live in a home that had black mold in her closet. 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       Pt lives with her youngest daughter, lives in a trailer, does have stairs into home. Does have some troubled with that. Highest level of education is GED.    Family History  Problem Relation Age of Onset  . Heart attack Mother   . Diabetes Maternal Grandfather   . Heart attack Maternal Grandfather   . Hypertension Maternal Grandfather   . Stroke Maternal Grandfather   . Heart attack Maternal Grandmother   . Hypertension Maternal Grandmother   . Alcohol abuse Father   . Thyroid disease Neg Hx   . Lung disease Neg Hx   . Colon cancer Neg Hx   . Breast cancer Neg Hx    Scheduled Meds: . bisacodyl  10 mg Rectal Daily  . fluticasone  2 spray Each Nare Daily  . heparin  5,000 Units Subcutaneous Q8H  . insulin aspart  0-9 Units Subcutaneous Q6H  . insulin glargine  10 Units Subcutaneous Daily  . mouth rinse  15 mL Mouth Rinse BID  . methylPREDNISolone (SOLU-MEDROL) injection  40 mg Intravenous Daily  . pantoprazole (PROTONIX) IV  40 mg Intravenous Q12H  . sodium chloride flush  3 mL Intravenous Q12H  . tamsulosin  0.4 mg Oral QPC supper   Continuous Infusions: . dextrose 5 % and 0.9% NaCl 125 mL/hr at 11/19/18 1423   PRN Meds:.albuterol, hydrALAZINE, HYDROcodone-homatropine, labetalol,  metoCLOPramide (REGLAN) injection, morphine injection, ondansetron (ZOFRAN) IV, phenol Medications Prior to Admission:  Prior to Admission medications   Medication Sig Start Date End Date Taking? Authorizing Provider  albuterol (ACCUNEB) 0.63 MG/3ML nebulizer solution Take 1 ampule by nebulization every 6 (six) hours as needed for wheezing or shortness of breath.   Yes [provider]  albuterol (PROAIR HFA) 108 (90 Base) MCG/ACT inhaler Inhale 2 puffs into the lungs every 6 (six) hours as needed for wheezing or shortness of breath. 11/02/17  Yes Parrett, Tammy S, NP  amLODipine (NORVASC) 10 MG tablet Take 1 tablet (10 mg total) by mouth daily. 03/21/18  Yes Hoyt Koch, MD  benzonatate (TESSALON PERLES) 100 MG capsule Take 1 capsule (100 mg total) by mouth every 6 (six) hours as needed for cough. 10/28/18 10/28/19 Yes Purohit, Konrad Dolores, MD  cyclobenzaprine (FLEXERIL) 5 MG tablet TAKE 1 TABLET(5 MG) BY MOUTH THREE TIMES DAILY AS NEEDED FOR MUSCLE SPASMS Patient taking differently: Take 5 mg by mouth 3 (three) times daily as needed for muscle spasms.  08/22/18  Yes Hoyt Koch, MD  furosemide (LASIX) 20 MG tablet Take 1 tablet (20 mg total) by mouth daily. Patient taking differently: Take 20 mg by mouth as needed for fluid.  05/27/18  Yes Parrett, Tammy S, NP  guaifenesin (HUMIBID E) 400 MG TABS tablet Take 400 mg by mouth 2 (two) times daily.   Yes [provider]  hydrALAZINE (APRESOLINE) 25 MG tablet Take 1 tablet (25 mg total) by mouth every 8 (eight) hours. 01/08/18 11/06/18 Yes Hoyt Koch, MD  labetalol (NORMODYNE) 100 MG tablet Take 1 tablet (100 mg total) by mouth 2 (two) times daily. 03/21/18  Yes Hoyt Koch, MD  pantoprazole (PROTONIX) 40 MG tablet TAKE 1 TABLET(40 MG) BY MOUTH DAILY Patient taking differently: Take 40 mg by mouth daily.  07/05/18  Yes Hoyt Koch, MD  tiotropium (SPIRIVA HANDIHALER) 18 MCG inhalation capsule Place 1  capsule (18 mcg total) into inhaler and inhale daily. 04/13/17  Yes Javier Glazier, MD  blood glucose meter kit and supplies KIT Use to test blood sugar 08/30/17   Hoyt Koch, MD  furosemide (LASIX) 40 MG tablet Take 1 tablet (40 mg total) by mouth daily for 7 days. 04/10/18 04/17/18  Juanito Doom, MD  glucose blood (IGLUCOSE TEST STRIPS) test strip Use as instructed 08/30/17   Hoyt Koch, MD  Lancets MISC Use to test blood sugar one daily 08/30/17   Hoyt Koch, MD   Allergies  Allergen Reactions  . Aspartame And Phenylalanine Nausea And Vomiting    Patient says she is allergic to all artificial sweeteners  . Doxycycline Other (See Comments)    Nausea, vomiting, HA, double vision  . Tramadol Shortness Of Breath and Nausea Only    Can tolerate Morphine/Dilaudid IV, Percocet per pt report. 11/08/18.  Marland Kitchen Neurontin [Gabapentin] Other (See Comments)    Dizzy, "drugged" feeling, sleepy  . Sulfa Antibiotics Other (See Comments)    Kidney problem   . Symbicort [Budesonide-Formoterol Fumarate]     Swelling of face and inside of mouth per pt  . Penicillins Rash    Has patient had a PCN reaction causing immediate rash, facial/tongue/throat swelling, SOB or lightheadedness with hypotension: No Has patient had a PCN reaction causing severe rash involving mucus membranes or skin necrosis: No Has patient had a PCN reaction that required hospitalization No Has patient had a PCN reaction occurring within the last 10 years: No If all of the above answers are "NO", then may proceed with Cephalosporin use.  . Prednisone Rash    Patient stated she received Prednisone while she was in the hospital and experienced a rash and "extreme pain.'   Review of Systems No abd pain  Physical Exam Awake alert Has NGT Trace edema Some abd distension, no pain Regular S1 S 2  Vital Signs: BP (!) 155/73 (BP Location: Left Arm)   Pulse 89   Temp 98.2 F (36.8 C) (Oral)    Resp 18   Ht '5\' 3"'  (1.6 m)   Wt 70.3 kg   SpO2 96%   BMI 27.45 kg/m  Pain Scale: 0-10 POSS *See Group Information*: 1-Acceptable,Awake and alert Pain Score: Asleep   SpO2: SpO2: 96 % O2 Device:SpO2: 96 % O2 Flow Rate: .O2 Flow Rate (L/min): 4 L/min  IO: Intake/output summary:   Intake/Output Summary (Last 24 hours) at 11/19/2018 1728 Last data filed at 11/19/2018 1608 Gross per 24 hour  Intake 2760.46 ml  Output 1375 ml  Net 1385.46 ml    LBM: Last BM Date: 11/06/18 Baseline Weight: Weight: 73.2 kg Most recent weight: Weight: 70.3 kg     Palliative Assessment/Data:   Flowsheet Rows     Most Recent Value  Intake Tab  Referral Department  Hospitalist  Unit at Time of Referral  Med/Surg Unit  Date Notified  11/18/18  Palliative Care Type  New Palliative care  Reason for referral  Non-pain Symptom  Date of Admission  11/06/18  # of days IP prior to Palliative referral  12  Clinical Assessment  Psychosocial & Spiritual Assessment  Palliative Care Outcomes     PPS 30% Time In:  1630 Time Out:  1730 Time Total:  60 min  Greater than 50%  of this time was spent counseling and coordinating care  related to the above assessment and plan.  Signed by: Loistine Chance, MD  [phone] Please contact Palliative Medicine Team phone at [phone] for questions and concerns.  For individual provider: See Shea Evans

## 2018-11-19 NOTE — Progress Notes (Deleted)
PMT no charge note  Palliative consult request received.  Chart reviewed Patient seen briefly this am, stated that she hasn't passed any gas, hasn't had a bowel movement.  No family in the room Patient remains admitted with ileus, abdominal distension, although her abdominal pain is better.  Full note and goals of care recommendations to follow, pending further discussions with patient/daughter on 11-20-2018. Thank you for the consult.   Loistine Chance MD [place] palliative medicine team [phone]

## 2018-11-20 LAB — COMPREHENSIVE METABOLIC PANEL
ALT: 134 U/L — ABNORMAL HIGH (ref 0–44)
AST: 38 U/L (ref 15–41)
Albumin: 2.8 g/dL — ABNORMAL LOW (ref 3.5–5.0)
Alkaline Phosphatase: 39 U/L (ref 38–126)
Anion gap: 4 — ABNORMAL LOW (ref 5–15)
BUN: 37 mg/dL — ABNORMAL HIGH (ref 8–23)
CO2: 26 mmol/L (ref 22–32)
Calcium: 8.2 mg/dL — ABNORMAL LOW (ref 8.9–10.3)
Chloride: 117 mmol/L — ABNORMAL HIGH (ref 98–111)
Creatinine, Ser: 1.6 mg/dL — ABNORMAL HIGH (ref 0.44–1.00)
GFR calc Af Amer: 37 mL/min — ABNORMAL LOW (ref >60)
GFR calc non Af Amer: 32 mL/min — ABNORMAL LOW (ref >60)
GLUCOSE: 168 mg/dL — AB (ref 70–99)
Potassium: 3.6 mmol/L (ref 3.5–5.1)
Sodium: 147 mmol/L — ABNORMAL HIGH (ref 135–145)
Total Bilirubin: 0.7 mg/dL (ref 0.3–1.2)
Total Protein: 4.9 g/dL — ABNORMAL LOW (ref 6.5–8.1)

## 2018-11-20 LAB — CBC
HCT: 33.5 % — ABNORMAL LOW (ref 36.0–46.0)
HEMOGLOBIN: 9.7 g/dL — AB (ref 12.0–15.0)
MCH: 26.8 pg (ref 26.0–34.0)
MCHC: 29 g/dL — ABNORMAL LOW (ref 30.0–36.0)
MCV: 92.5 fL (ref 80.0–100.0)
Platelets: 181 10*3/uL (ref 150–400)
RBC: 3.62 MIL/uL — AB (ref 3.87–5.11)
RDW: 14.4 % (ref 11.5–15.5)
WBC: 9.7 10*3/uL (ref 4.0–10.5)
nRBC: 0 % (ref 0.0–0.2)

## 2018-11-20 LAB — GLUCOSE, CAPILLARY
Glucose-Capillary: 155 mg/dL — ABNORMAL HIGH (ref 70–99)
Glucose-Capillary: 141 mg/dL — ABNORMAL HIGH (ref 70–99)
Glucose-Capillary: 126 mg/dL — ABNORMAL HIGH (ref 70–99)

## 2018-11-20 LAB — MAGNESIUM: Magnesium: 2.1 mg/dL (ref 1.7–2.4)

## 2018-11-20 MED ORDER — DEXTROSE-NACL 5-0.45 % IV SOLN
INTRAVENOUS | Status: DC
  Administered 2018-11-20 – 2018-11-23 (×7): via INTRAVENOUS

## 2018-11-20 MED ORDER — LABETALOL HCL 5 MG/ML IV SOLN
10.0000 mg | INTRAVENOUS | Status: DC | PRN
  Administered 2018-11-26: 10 mg via INTRAVENOUS
  Filled 2018-11-20 (×2): qty 4

## 2018-11-20 NOTE — Progress Notes (Signed)
This RN has assumed care over the pt at this time. Agree with previous nurse's assessment. Plan of care explained and all questions answered. No complaints at this time. Will continue to monitor.

## 2018-11-20 NOTE — Progress Notes (Addendum)
Nutrition Follow-up  DOCUMENTATION CODES:   Non-severe (moderate) malnutrition in context of chronic illness  INTERVENTION:  - Will continue to monitor for updates from Palliative Care concerning [place].  - If unable to advance diet in the next 24-48 hours, recommend PICC placement and initiation of TPN given NPO x3 days, moderate malnutrition.    NUTRITION DIAGNOSIS:   Moderate Malnutrition related to chronic illness(COPD) as evidenced by percent weight loss, energy intake < 75% for > or equal to 1 month, moderate muscle depletion, severe muscle depletion. -ongoing  GOAL:   Patient will meet greater than or equal to 90% of their needs -unmet/unable to meet at this time.   MONITOR:   Diet advancement, Weight trends, Labs, I & O's  ASSESSMENT:   Patient with PMH significant for COPD, CKD III, and HTN. Recently admitted January 23-January 27 where she was treated for COPD exacerbation in the setting of PNA. Presents this admission with acute exacerbation of COPD.   Weight has remained fairly stable. Review of diet over the past 1 week: 2/11 at 11:55 AM-2/12 at 6:55 PM--NPO 2/12 at 6:55 PM-2/16 at 5:40 PM--CLD 2/16 at 5:40 PM-present--NPO  Per review of flow sheet, last documented intake was 100% of breakfast on 2/9. NGT inserted and placed to LIS on 2/16 at 10:35 PM. She has had 250 ml output this shift.   Palliative following and patient last seen by Dr. Rowe Pavy yesterday. Note indicates plan to continue full code.   Per Dr. Ailene Rud note yesterday: abdominal distention thought to be 2/2 ileus, ongoing constipation/no BM, considering CT if she continues to decline, possible need for nutrition support, COPD exacerbation--improved, leukocytosis.    Medications reviewed; sliding scale novolog, 10 units lantus/day, 40 mg solu-medrol/day, 40 mg IV protonix BID.  Labs reviewed; CBGs: 155 and 141 mg/dl today, Na: 147 mmol/l, Cl: 117 mmol/l, BUN: 37 mg/dl, creatinine: 1.6 mg/dl, Ca: 8.2  mg/dl, GFR: 32 ml/min.  IVF; D5-NS @ 125 ml/hr (510 kcal).    Diet Order:   Diet Order            Diet NPO time specified Except for: Ice Chips  Diet effective now              EDUCATION NEEDS:   Education needs have been addressed  Skin:  Skin Assessment: Skin Integrity Issues: Skin Integrity Issues:: Other (Comment) Other: MASD- bilateral perineum  Last BM:  2/5  Height:   Ht Readings from Last 1 Encounters:  11/07/18 5\' 3"  (1.6 m)    Weight:   Wt Readings from Last 1 Encounters:  11/20/18 71.3 kg    Ideal Body Weight:  52.3 kg  BMI:  Body mass index is 27.84 kg/m.  Estimated Nutritional Needs:   Kcal:  1650-1850 kcal  Protein:  80-95 grams  Fluid:  >/= 1.6 L/day     Jarome Matin, MS, RD, LDN, [facility] Inpatient Clinical Dietitian Pager # [phone] After hours/weekend pager # [phone]

## 2018-11-20 NOTE — Consult Note (Signed)
   [facility] CM Inpatient Consult   11/20/2018  Victoria Bird [birth year] [id number]    Referral received from inpatient [facility] for [place] Management services. Made aware that patient has declined SNF.  Went to bedside to offer [place] Management services. [name] abruptly said "no" to [place] [facility] follow up.   Left [name] Care Management brochure with contact information at bedside.   Made inpatient RNCM aware [place] Management services were declined.   Marthenia Rolling, MSN-Ed, RN,BSN [facility] Liaison [phone]

## 2018-11-20 NOTE — Plan of Care (Signed)
  Problem: Education: Goal: Knowledge of General Education information will improve Description Including pain rating scale, medication(s)/side effects and non-pharmacologic comfort measures Outcome: Progressing   Problem: Health Behavior/Discharge Planning: Goal: Ability to manage health-related needs will improve Outcome: Progressing   Problem: Clinical Measurements: Goal: Ability to maintain clinical measurements within normal limits will improve Outcome: Progressing Goal: Will remain free from infection Outcome: Progressing Goal: Respiratory complications will improve Outcome: Progressing Goal: Cardiovascular complication will be avoided Outcome: Progressing   Problem: Activity: Goal: Risk for activity intolerance will decrease Outcome: Progressing Note:  Pt encouraged to work w/ PT/OT.    Problem: Nutrition: Goal: Adequate nutrition will be maintained Outcome: Progressing Note:  Pt remains NPO w/ NGT in place.    Problem: Elimination: Goal: Will not experience complications related to bowel motility Outcome: Progressing Goal: Will not experience complications related to urinary retention Outcome: Progressing   Problem: Pain Managment: Goal: General experience of comfort will improve Outcome: Progressing   Problem: Skin Integrity: Goal: Risk for impaired skin integrity will decrease Outcome: Progressing   Problem: Education: Goal: Knowledge of the prescribed therapeutic regimen will improve Outcome: Progressing   Problem: Activity: Goal: Ability to tolerate increased activity will improve Outcome: Progressing   Problem: Respiratory: Goal: Ability to maintain a clear airway will improve Outcome: Progressing Goal: Levels of oxygenation will improve Outcome: Progressing Goal: Ability to maintain adequate ventilation will improve Outcome: Progressing

## 2018-11-20 NOTE — Progress Notes (Signed)
PROGRESS NOTE    Victoria Bird  VOH:[id number] DOB: [birth year] DOA: 11/06/2018 PCP: Hoyt Koch, MD    Brief Narrative:   Patient is a 73 year old female history of chronic respiratory failure on 2 L nasal cannula chronically secondary to severe COPD with very severe obstruction on spirometry of July 2019, chronic kidney disease stage III- 4, former tobacco user quit in 2019, hypertension presented to the ED with worsening shortness of breath and was admitted for an acute COPD exacerbation. Patient on admission was placed in the stepdown unit and placed on BiPAP, IV steroids, scheduled nebulizer treatments. Patient subsequently improved was taken off BiPAP and transferred to the telemetry floor. Patient still with ongoing poor air movement. On 11/29/2018 patient with worsening shortness of breath, use of accessory muscles of respiration, and subsequently transferred back to the stepdown unit to be placed on BiPAP. During stepdown stay, patient did have constipation and ileus.  Surgery was consulted.  Surgery recommended conservative treatment but patient had compliance issues.  Subsequently patient was transferred out of the stepdown unit but developed more abdominal distention and vomiting.  She was then placed on a NG tube with intermittent suction with mild improvement in her symptoms.  Family requested palliative care evaluation for her ongoing symptoms.   Assessment & Plan:   Principal Problem:   Acute on chronic respiratory failure with hypoxia and hypercapnia ([name]) Active Problems:   Hypertension   GERD (gastroesophageal reflux disease)   OSA (obstructive sleep apnea)   Diabetes mellitus type 2, controlled ([name])   COPD exacerbation ([name])   Tobacco abuse   COPD with acute exacerbation ([name])   Chronic respiratory failure with hypoxia ([name])   Acute exacerbation of chronic obstructive pulmonary disease (COPD) ([name])   Malnutrition of moderate degree   ARF (acute renal  failure) ([name])   Hyperkalemia   Abdominal discomfort   Abdominal distention, constipation Ileus versus small bowel obstruction Symptomatically better with NG tube with less abdominal distention and less nausea but has not had a bowel movement,  but reports flatus.  KUB 2/18 with small bowel dilation consistent with partial/early high-grade SBO.  She had been seen by surgery who signed off on the patient because of lack of compliance to contrast study, enema and other treatments.    --continue NG tube to intermittent suction, 861mL out past 24h --Continue on D5 water normal saline.  --Palliative care following for assistance with goals of care; appreciate assistance --Repeat KUB in the a.m. --Consideration of clamp NG tube tomorrow morning with clear liquids if continues with flatus and improved bowel sounds --If symptoms continue to worsen, consider CT abdomen/pelvis with oral contrast for further evaluation --If no improvement over the next few days, will need to consider alternative ways of nutrition with pick line placement for TPN  Acute on chronic hypoxic, hypercapnic respiratory failure secondary to COPD exacerbation.  Human metapneumovirus Respiratory viral panel was positive for metapneumovirus.    Completed 4-day course of Levaquin. --Continue nebs as needed --Continue Solu-Medrol 40 mg IV daily since NPO for ileus as above --Continues on supplemental oxygen, 3LNC (on 2LNC at home); continue to titrate for SPO2 >88%  Hypernatremia Sodium up to 147 today.  Etiology likely secondary to IV fluids.  Currently on D5 normal saline at 125 mL/h. --Will change IV fluids to D5 half-normal saline at 125 mL/h --Repeat BMP in the a.m.  Leukocytosis. Likely secondary to steroids.  Trending down. --Continue to monitor CBC daily  History of GERD.  Continue  PPI.  Acute kidney injury on chronic kidney disease stage III.     Renal ultrasound showed echogenic kidneys.  No hydronephrosis.     Etiology likely secondary to dehydration from ileus. --Creatinine improving,2.76-->1.90-->1.60 --Continue IV fluid hydration while n.p.o. --Continue monitor renal function daily  Moderate protein calorie malnutrition --Continue NPO 2/2 ileus as above --Continue D5 1/2 NS @ 139ml.hr. --If no improvement in ileus and next 2-3 days, will likely need PICC line for TPN  Hypertension Continue with hydralazine as needed for hypertension.  On Flomax.  Unable to use PO at this time.  Anemiaof chronic disease Stable at this time, but globin slightly drifting down with some dark red output in NGT output --Closely monitor CBC; may need to discontinue heparin DVT prophylaxis  Diabetes mellitus type 2 Hemoglobin A1c 6.1 on 11/08/2018.  On Lantus and sliding scale insulin every 6 hourly.  Closely monitor blood glucose levels.  Urinary retention: We will need to closely monitor.  Recently had a Foley catheter  Hyperkalemia: resolved --continue to monitor BMP with magnesium daily  Noncompliance to treatment plan Debility, deconditioning. Intermittent.  Not motivated for physical therapy.  Initial PT evaluation recommended SNF placement; patient adamantly refuses and wishes to return home.  Not participating with physical therapy and not ambulating.  Discussed with patient if she would like to return home she will have to participate to ensure she is safe for discharge home when ready.  I have been continually encouraging the patient for participation.    DVT prophylaxis: Heparin Code Status: DNR Family Communication: None Disposition Plan: Continue inpatient level of care   Consultants:   General surgery  Pulmonary/critical care medicine  Procedures:   VQ scan 11/08/2018: Low probability PE  Renal ultrasound 11/09/2018: Bilateral echogenic kidneys, no hydronephrosis/mass  Antimicrobials:   Levaquin 2/5-2/9   Subjective: Patient seen and examined at bedside, resting  comfortably in bed.  Wants to know when she can discharge home.  Continues with NG tube to low wall suction secondary to ileus.  She reports passing gas with decreased abdominal distention.  Denies any nausea or abdominal pain today.  Objective: Vitals:   11/19/18 0808 11/19/18 1225 11/19/18 2104 11/20/18 0629  BP: (!) 152/71 (!) 155/73 (!) 167/79 (!) 175/75  Pulse: 91 89 84 79  Resp: 18 18 16 14   Temp: 97.7 F (36.5 C) 98.2 F (36.8 C) 98.1 F (36.7 C) (!) 97.4 F (36.3 C)  TempSrc: Oral Oral Oral Oral  SpO2: 96%  91% 99%  Weight:    71.3 kg  Height:        Intake/Output Summary (Last 24 hours) at 11/20/2018 1246 Last data filed at 11/20/2018 1239 Gross per 24 hour  Intake 3993.72 ml  Output 2525 ml  Net 1468.72 ml   Filed Weights   11/18/18 0459 11/19/18 0444 11/20/18 0629  Weight: 68.6 kg 70.3 kg 71.3 kg    Examination:  General exam: Appears calm and comfortable  Respiratory system: Clear to auscultation. Respiratory effort normal. On 3LNC Cardiovascular system: S1 & S2 heard, RRR. No JVD, murmurs, rubs, gallops or clicks. No pedal edema. Gastrointestinal system: Abdomen is nondistended, soft and nontender. No organomegaly or masses felt.  Very faint bowel sounds Central nervous system: Alert and oriented. No focal neurological deficits. Extremities: Symmetric 5 x 5 power. Skin: No rashes, lesions or ulcers Psychiatry: Judgement and insight appear normal. Mood & affect appropriate.     Data Reviewed: I have personally reviewed following labs and imaging studies  CBC: Recent Labs  Lab 11/15/18 0743 11/17/18 0356 11/18/18 0500 11/19/18 1138 11/20/18 0604  WBC 13.4* 17.1* 19.4* 13.9* 9.7  HGB 12.0 12.1 11.7* 10.6* 9.7*  HCT 39.1 40.8 38.7 35.0* 33.5*  MCV 89.7 88.7 91.5 92.3 92.5  PLT 212 240 221 187 037   Basic Metabolic Panel: Recent Labs  Lab 11/15/18 0743 11/17/18 0356 11/18/18 0500 11/19/18 1138 11/20/18 0604  NA 137 136 138 144 147*  K 5.0 5.0  4.3 4.0 3.6  CL 98 96* 101 112* 117*  CO2 29 27 27 26 26   GLUCOSE 150* 144* 94 124* 168*  BUN 85* 83* 90* 58* 37*  CREATININE 2.41* 2.51* 2.76* 1.90* 1.60*  CALCIUM 8.8* 8.7* 8.5* 8.3* 8.2*  MG  --   --  2.3  --  2.1   GFR: Estimated Creatinine Clearance: 30.5 mL/min (A) (by C-G formula based on SCr of 1.6 mg/dL (H)). Liver Function Tests: Recent Labs  Lab 11/20/18 0604  AST 38  ALT 134*  ALKPHOS 39  BILITOT 0.7  PROT 4.9*  ALBUMIN 2.8*   No results for input(s): LIPASE, AMYLASE in the last 168 hours. No results for input(s): AMMONIA in the last 168 hours. Coagulation Profile: No results for input(s): INR, PROTIME in the last 168 hours. Cardiac Enzymes: No results for input(s): CKTOTAL, CKMB, CKMBINDEX, TROPONINI in the last 168 hours. BNP (last 3 results) Recent Labs    04/10/18 1715  PROBNP 37.0   HbA1C: No results for input(s): HGBA1C in the last 72 hours. CBG: Recent Labs  Lab 11/19/18 1147 11/19/18 1735 11/19/18 2339 11/20/18 0619 11/20/18 1153  GLUCAP 108* 132* 138* 155* 141*   Lipid Profile: No results for input(s): CHOL, HDL, LDLCALC, TRIG, CHOLHDL, LDLDIRECT in the last 72 hours. Thyroid Function Tests: No results for input(s): TSH, T4TOTAL, FREET4, T3FREE, THYROIDAB in the last 72 hours. Anemia Panel: No results for input(s): VITAMINB12, FOLATE, FERRITIN, TIBC, IRON, RETICCTPCT in the last 72 hours. Sepsis Labs: No results for input(s): PROCALCITON, LATICACIDVEN in the last 168 hours.  No results found for this or any previous visit (from the past 240 hour(s)).       Radiology Studies: No results found.      Scheduled Meds: . bisacodyl  10 mg Rectal Daily  . fluticasone  2 spray Each Nare Daily  . heparin  5,000 Units Subcutaneous Q8H  . insulin aspart  0-9 Units Subcutaneous Q6H  . insulin glargine  10 Units Subcutaneous Daily  . mouth rinse  15 mL Mouth Rinse BID  . methylPREDNISolone (SOLU-MEDROL) injection  40 mg Intravenous  Daily  . pantoprazole (PROTONIX) IV  40 mg Intravenous Q12H  . sodium chloride flush  3 mL Intravenous Q12H  . tamsulosin  0.4 mg Oral QPC supper   Continuous Infusions: . dextrose 5 % and 0.9% NaCl 125 mL/hr at 11/20/18 1000     LOS: 14 days    Time spent: 27 minutes    Eric J [place] MD [facility] Pager [phone]  If 7PM-7AM, please contact night-coverage [email or web address] Password [facility] 11/20/2018, 12:46 PM

## 2018-11-21 ENCOUNTER — Inpatient Hospital Stay (HOSPITAL_COMMUNITY): Payer: Medicare Other

## 2018-11-21 DIAGNOSIS — Z7189 Other specified counseling: Secondary | ICD-10-CM

## 2018-11-21 DIAGNOSIS — Z515 Encounter for palliative care: Secondary | ICD-10-CM

## 2018-11-21 LAB — BASIC METABOLIC PANEL
Anion gap: 4 — ABNORMAL LOW (ref 5–15)
BUN: 26 mg/dL — AB (ref 8–23)
CO2: 26 mmol/L (ref 22–32)
Calcium: 7.9 mg/dL — ABNORMAL LOW (ref 8.9–10.3)
Chloride: 114 mmol/L — ABNORMAL HIGH (ref 98–111)
Creatinine, Ser: 1.42 mg/dL — ABNORMAL HIGH (ref 0.44–1.00)
GFR calc Af Amer: 43 mL/min — ABNORMAL LOW (ref >60)
GFR calc non Af Amer: 37 mL/min — ABNORMAL LOW (ref >60)
Glucose, Bld: 128 mg/dL — ABNORMAL HIGH (ref 70–99)
Potassium: 3.1 mmol/L — ABNORMAL LOW (ref 3.5–5.1)
Sodium: 144 mmol/L (ref 135–145)

## 2018-11-21 LAB — CBC
HCT: 34.3 % — ABNORMAL LOW (ref 36.0–46.0)
Hemoglobin: 9.9 g/dL — ABNORMAL LOW (ref 12.0–15.0)
MCH: 27.3 pg (ref 26.0–34.0)
MCHC: 28.9 g/dL — ABNORMAL LOW (ref 30.0–36.0)
MCV: 94.5 fL (ref 80.0–100.0)
Platelets: 154 10*3/uL (ref 150–400)
RBC: 3.63 MIL/uL — ABNORMAL LOW (ref 3.87–5.11)
RDW: 14.1 % (ref 11.5–15.5)
WBC: 10.3 10*3/uL (ref 4.0–10.5)
nRBC: 0 % (ref 0.0–0.2)

## 2018-11-21 LAB — GLUCOSE, CAPILLARY
GLUCOSE-CAPILLARY: 112 mg/dL — AB (ref 70–99)
Glucose-Capillary: 112 mg/dL — ABNORMAL HIGH (ref 70–99)
Glucose-Capillary: 133 mg/dL — ABNORMAL HIGH (ref 70–99)
Glucose-Capillary: 90 mg/dL (ref 70–99)

## 2018-11-21 LAB — MAGNESIUM: Magnesium: 1.7 mg/dL (ref 1.7–2.4)

## 2018-11-21 MED ORDER — IOHEXOL 300 MG/ML  SOLN
100.0000 mL | Freq: Once | INTRAMUSCULAR | Status: AC | PRN
  Administered 2018-11-21: 75 mL via INTRAVENOUS

## 2018-11-21 MED ORDER — METHYLPREDNISOLONE SODIUM SUCC 40 MG IJ SOLR
20.0000 mg | Freq: Every day | INTRAMUSCULAR | Status: AC
  Administered 2018-11-22 – 2018-11-23 (×2): 20 mg via INTRAVENOUS
  Filled 2018-11-21 (×2): qty 1

## 2018-11-21 MED ORDER — SODIUM CHLORIDE (PF) 0.9 % IJ SOLN
INTRAMUSCULAR | Status: AC
  Filled 2018-11-21: qty 50

## 2018-11-21 MED ORDER — IOHEXOL 300 MG/ML  SOLN
30.0000 mL | Freq: Once | INTRAMUSCULAR | Status: AC | PRN
  Administered 2018-11-21: 30 mL via ORAL

## 2018-11-21 NOTE — Progress Notes (Signed)
PMT progress note  The patient is resting in bed, she has NGT draining quite a bit of dark bilious material. The patient is asking for something to eat/drink. Plain films show ongoing obstruction. Discussed with TRH MD.   BP (!) 165/76 (BP Location: Left Arm)   Pulse 82   Temp (!) 97.5 F (36.4 C) (Oral)   Resp 16   Ht 5\' 3"  (1.6 m)   Wt 73.3 kg   SpO2 100%   BMI 28.63 kg/m  Awake alert Has NGT Abdomen is not distended, not tender  She does have faint bowel sounds S1 S2 Clear  No edema Non focal  PPS 40%  Ileus versus SBO Acute on chronic hypoxic, hypercapnic respiratory failure secondary to COPD exacerbation. Human metapneumovirus Debility, deconditioning.   Ongoing goals of care discussions with patient. She is awake alert, oriented. She wishes to be in charge of all of her medical decision making. She declares that she wants to eat drink and that she doesn't want to go to no nursing home.  We will continue to try to find ways to engage with her and to establish a trust relationship.  She has no designated Psychiatrist.  At the time of the initial consult, I had discussed with the patient's daughter, who stated that the patient has always refused advanced directives discussions.  Continue current mode of care.   25 minutes spent Loistine Chance MD [facility] health palliative medicine team. [phone].

## 2018-11-21 NOTE — Progress Notes (Signed)
PROGRESS NOTE    Victoria Bird  DGU:[id number] DOB: [birth year] DOA: 11/06/2018 PCP: Hoyt Koch, MD    Brief Narrative:   Patient is a 73 year old female history of chronic respiratory failure on 2 L nasal cannula chronically secondary to severe COPD with very severe obstruction on spirometry of July 2019, chronic kidney disease stage III- 4, former tobacco user quit in 2019, hypertension presented to the ED with worsening shortness of breath and was admitted for an acute COPD exacerbation. Patient on admission was placed in the stepdown unit and placed on BiPAP, IV steroids, scheduled nebulizer treatments. Patient subsequently improved was taken off BiPAP and transferred to the telemetry floor. Patient still with ongoing poor air movement. On 11/29/2018 patient with worsening shortness of breath, use of accessory muscles of respiration, and subsequently transferred back to the stepdown unit to be placed on BiPAP. During stepdown stay, patient did have constipation and ileus.  Surgery was consulted.  Surgery recommended conservative treatment but patient had compliance issues.  Subsequently patient was transferred out of the stepdown unit but developed more abdominal distention and vomiting.  She was then placed on a NG tube with intermittent suction with mild improvement in her symptoms.  Family requested palliative care evaluation for her ongoing symptoms.   Assessment & Plan:   Principal Problem:   Acute on chronic respiratory failure with hypoxia and hypercapnia ([name]) Active Problems:   Hypertension   GERD (gastroesophageal reflux disease)   OSA (obstructive sleep apnea)   Diabetes mellitus type 2, controlled ([name])   COPD exacerbation ([name])   Tobacco abuse   COPD with acute exacerbation ([name])   Chronic respiratory failure with hypoxia ([name])   Acute exacerbation of chronic obstructive pulmonary disease (COPD) ([name])   Malnutrition of moderate degree   ARF (acute renal  failure) ([name])   Hyperkalemia   Abdominal discomfort   Abdominal distention, constipation Ileus versus small bowel obstruction Symptomatically better with NG tube with less abdominal distention and less nausea but has not had a bowel movement,  but reports flatus.  KUB 2/18 with small bowel dilation consistent with partial/early high-grade SBO.  She had been seen by surgery who signed off on the patient because of lack of compliance to contrast study, enema and other treatments.    --continue NG tube to intermittent suction, 920mL out past 24h --Continue on D5 1/2 NS  --Palliative care following for assistance with goals of care; appreciate assistance --Repeat KUB today with 2 units small bowel dilation concerning for distal small bowel obstruction --Check CT abdomen/pelvis with oral contrast for further evaluation --If no improvement over the next few days, will need to consider alternative ways of nutrition w/ PICC placement and TPN. --Also if no improvement may need to rediscuss need for surgical intervention with the patient, which she is been adamantly against during this hospitalization  Acute on chronic hypoxic, hypercapnic respiratory failure secondary to COPD exacerbation.  Human metapneumovirus Respiratory viral panel was positive for metapneumovirus.    Completed 4-day course of Levaquin. --Continue nebs as needed --start to taper off of Solu-Medrol --Continues on supplemental oxygen, 3LNC (on 2LNC at home); continue to titrate for SPO2 >88%  Hypernatremia Sodium up to 147 on 2/19.  Etiology likely secondary to IV fluids vs dehydration.  --Improved to 144 today on D5 half-normal saline at 125 mL/h --daily BMP  Leukocytosis. Likely secondary to steroids.  Trending down. --Continue to monitor CBC daily --Start to taper off Solu-Medrol  History of GERD.  Continue  PPI.  Acute kidney injury on chronic kidney disease stage III.     Renal ultrasound showed echogenic  kidneys.  No hydronephrosis.    Etiology likely secondary to dehydration from ileus. --Creatinine improving,2.76-->1.90-->1.60-->1.42 --Continue IV fluid hydration while n.p.o. --Continue monitor renal function daily  Moderate protein calorie malnutrition --Continue NPO 2/2 ileus as above --Continue D5 1/2 NS @ 158ml.hr. --If no improvement in ileus and next 2-3 days, will likely need PICC line for TPN  Hypertension Continue with hydralazine as needed for hypertension.  On Flomax.  Unable to use PO at this time.  Anemiaof chronic disease Stable at this time, but globin slightly drifting down with some dark red output in NGT output --Closely monitor CBC; may need to discontinue heparin DVT prophylaxis  Diabetes mellitus type 2 Hemoglobin A1c 6.1 on 11/08/2018.  On Lantus and sliding scale insulin every 6 hourly.  Closely monitor blood glucose levels.  Urinary retention: We will need to closely monitor.  Recently had a Foley catheter  Hyperkalemia: resolved --continue to monitor BMP with magnesium daily  Noncompliance to treatment plan Debility, deconditioning. Intermittent.  Not motivated for physical therapy.  Initial PT evaluation recommended SNF placement; patient adamantly refuses and wishes to return home.  Not participating with physical therapy and not ambulating.  Discussed with patient if she would like to return home she will have to participate to ensure she is safe for discharge home when ready.  I have been continually encouraging the patient for participation.     DVT prophylaxis: Heparin Code Status: DNR Family Communication: None Disposition Plan: Continue inpatient level of care   Consultants:   General surgery  Pulmonary/critical care medicine  Procedures:   VQ scan 11/08/2018: Low probability PE  Renal ultrasound 11/09/2018: Bilateral echogenic kidneys, no hydronephrosis/mass  Antimicrobials:   Levaquin 2/5-2/9   Subjective: Patient seen and  examined at bedside, resting comfortably in bed.  States not much flatus today.  Bowel sounds relatively absent on exam.  Patient states getting frustrated with hospital course, although she is nonparticipant [place] and much of the treatment plan to include working with physical therapy and declined any surgical intervention with her persistent ileus/small bowel obstruction. Continues with NG tube to low wall suction secondary to ileus.  KUB notable for persistent small bowel dilation.  Checking CT abdomen/pelvis with oral contrast today.  Denies any nausea or abdominal pain today.  No other complaints.  Denies headache, no chest pain, no palpitations, no nausea/vomiting/diarrhea, no fever/chills/night sweats.  No other concerns overnight per nursing staff.  Objective: Vitals:   11/20/18 1258 11/20/18 2125 11/21/18 0027 11/21/18 0558  BP: 128/74 (!) 166/82 (!) 146/75 (!) 165/76  Pulse: 75 81 79 82  Resp: 16 14 18 16   Temp: (!) 97.5 F (36.4 C) 98 F (36.7 C) 98.2 F (36.8 C) (!) 97.5 F (36.4 C)  TempSrc: Oral Oral Oral Oral  SpO2: 100% 100% 99% 100%  Weight:    73.3 kg  Height:        Intake/Output Summary (Last 24 hours) at 11/21/2018 1317 Last data filed at 11/21/2018 1026 Gross per 24 hour  Intake 2583.88 ml  Output 1550 ml  Net 1033.88 ml   Filed Weights   11/19/18 0444 11/20/18 0629 11/21/18 0558  Weight: 70.3 kg 71.3 kg 73.3 kg    Examination:  General exam: Appears calm and comfortable  Respiratory system: Clear to auscultation. Respiratory effort normal. On 3LNC Cardiovascular system: S1 & S2 heard, RRR. No JVD, murmurs, rubs,  gallops or clicks. No pedal edema. Gastrointestinal system: Abdomen is nondistended, soft and nontender. No organomegaly or masses felt.  Very faint bowel sounds Central nervous system: Alert and oriented. No focal neurological deficits. Extremities: Symmetric 5 x 5 power. Skin: No rashes, lesions or ulcers Psychiatry: Judgement and insight appear  normal. Mood & affect appropriate.     Data Reviewed: I have personally reviewed following labs and imaging studies  CBC: Recent Labs  Lab 11/17/18 0356 11/18/18 0500 11/19/18 1138 11/20/18 0604 11/21/18 0606  WBC 17.1* 19.4* 13.9* 9.7 10.3  HGB 12.1 11.7* 10.6* 9.7* 9.9*  HCT 40.8 38.7 35.0* 33.5* 34.3*  MCV 88.7 91.5 92.3 92.5 94.5  PLT 240 221 187 181 564   Basic Metabolic Panel: Recent Labs  Lab 11/17/18 0356 11/18/18 0500 11/19/18 1138 11/20/18 0604 11/21/18 0606  NA 136 138 144 147* 144  K 5.0 4.3 4.0 3.6 3.1*  CL 96* 101 112* 117* 114*  CO2 27 27 26 26 26   GLUCOSE 144* 94 124* 168* 128*  BUN 83* 90* 58* 37* 26*  CREATININE 2.51* 2.76* 1.90* 1.60* 1.42*  CALCIUM 8.7* 8.5* 8.3* 8.2* 7.9*  MG  --  2.3  --  2.1 1.7   GFR: Estimated Creatinine Clearance: 34.9 mL/min (A) (by C-G formula based on SCr of 1.42 mg/dL (H)). Liver Function Tests: Recent Labs  Lab 11/20/18 0604  AST 38  ALT 134*  ALKPHOS 39  BILITOT 0.7  PROT 4.9*  ALBUMIN 2.8*   No results for input(s): LIPASE, AMYLASE in the last 168 hours. No results for input(s): AMMONIA in the last 168 hours. Coagulation Profile: No results for input(s): INR, PROTIME in the last 168 hours. Cardiac Enzymes: No results for input(s): CKTOTAL, CKMB, CKMBINDEX, TROPONINI in the last 168 hours. BNP (last 3 results) Recent Labs    04/10/18 1715  PROBNP 37.0   HbA1C: No results for input(s): HGBA1C in the last 72 hours. CBG: Recent Labs  Lab 11/20/18 1153 11/20/18 1901 11/21/18 0034 11/21/18 0557 11/21/18 1202  GLUCAP 141* 126* 112* 112* 133*   Lipid Profile: No results for input(s): CHOL, HDL, LDLCALC, TRIG, CHOLHDL, LDLDIRECT in the last 72 hours. Thyroid Function Tests: No results for input(s): TSH, T4TOTAL, FREET4, T3FREE, THYROIDAB in the last 72 hours. Anemia Panel: No results for input(s): VITAMINB12, FOLATE, FERRITIN, TIBC, IRON, RETICCTPCT in the last 72 hours. Sepsis Labs: No results  for input(s): PROCALCITON, LATICACIDVEN in the last 168 hours.  No results found for this or any previous visit (from the past 240 hour(s)).       Radiology Studies: Dg Abd 1 View  Result Date: 11/21/2018 CLINICAL DATA:  Abdominal pain. EXAM: ABDOMEN - 1 VIEW COMPARISON:  Radiograph of November 17, 2018. FINDINGS: Distal tip of nasogastric tube is seen in expected position of distal stomach. Continued small bowel dilatation is noted concerning for distal small bowel obstruction. No colonic dilatation is noted. Stool is noted in the colon. Status post cholecystectomy. IMPRESSION: Continued small bowel dilatation is noted concerning for distal small bowel obstruction. Electronically Signed   By: Marijo Conception, M.D.   On: 11/21/2018 09:16        Scheduled Meds: . bisacodyl  10 mg Rectal Daily  . fluticasone  2 spray Each Nare Daily  . heparin  5,000 Units Subcutaneous Q8H  . insulin aspart  0-9 Units Subcutaneous Q6H  . insulin glargine  10 Units Subcutaneous Daily  . mouth rinse  15 mL Mouth Rinse BID  .  methylPREDNISolone (SOLU-MEDROL) injection  40 mg Intravenous Daily  . pantoprazole (PROTONIX) IV  40 mg Intravenous Q12H  . sodium chloride flush  3 mL Intravenous Q12H  . tamsulosin  0.4 mg Oral QPC supper   Continuous Infusions: . dextrose 5 % and 0.45% NaCl 125 mL/hr at 11/21/18 1312     LOS: 15 days    Time spent: 29 minutes    Lia Hopping [facility] Pager [phone]  If 7PM-7AM, please contact night-coverage [email or web address] Password [email or web address] 11/21/2018, 1:17 PM

## 2018-11-21 NOTE — Progress Notes (Signed)
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  Patient on admission was placed in the stepdown unit and placed on BiPAP, IV steroids, scheduled nebulizer treatments.  Patient subsequently improved was taken off BiPAP and transferred to the telemetry floor.  Patient still with ongoing poor air movement.  On 11/09/2018 patient with worsening shortness of breath, use of accessory muscles of respiration, and subsequently transferred back to the stepdown unit to be placed on BiPAP. Pt with recent admission and discharge 1/23-1/27.    OT comments  Pt not progressing towards OT goals at this time, Pt remains self-limiting and not motivated to work with therapy. She did open up a little when I got her talking about her great-grandson "hudson". She refused OOB, but was able to sit EOB (min guard assist for bed mobility) for approx 25 min while she drank her contrast drink for CT. She talked to me about falling through the floor at her home, that she has NO INTEREST in any follow up therapy - I also encouraged her to work more with Palliative care that making a plan and managing all the "specialist" doctors will help her and her family and establishing goals for herself would be good in making medical decisions. The only thing she cares about right now is getting "the tube" out of her nose and going home. She is not worried about being able to do anything at home as far as ADL/iADL and so I gently tried to encourage participation at the hospital to  promote safety, activity tolerance, and independence. OT will continue to follow acutely.    Follow Up Recommendations  SNF;Supervision/Assistance - 24 hour(Pt will decline all post-acute services)    Equipment Recommendations  Other (comment)(defer to next venue)    Recommendations for Other Services      Precautions / Restrictions Precautions Precautions: Fall Precaution Comments: monitor O2 sats, pt on 2L O2 at baseline Restrictions Weight Bearing Restrictions: No       Mobility Bed Mobility Overal bed mobility: Needs Assistance Bed Mobility: Supine to Sit;Sit to Supine     Supine to sit: Min assist;HOB elevated Sit to supine: Min guard   General bed mobility comments: use of bed rails to assist, min A for trunk elevation  Transfers                 General transfer comment: Pt declined OOB transfers despite MAX education    Balance Overall balance assessment: Needs assistance Sitting-balance support: Feet supported;Single extremity supported Sitting balance-Leahy Scale: Fair Sitting balance - Comments: not able to accept challenge                                    ADL either performed or assessed with clinical judgement   ADL   Eating/Feeding: Modified independent;Sitting Eating/Feeding Details (indicate cue type and reason): drinking water with contrast as ordered per MD Grooming: Set up;Sitting Grooming Details (indicate cue type and reason): EOB for approx 20 min  Toilet Transfer Details (indicate cue type and reason): strongly declined OOB at this time           General ADL Comments: Pt requiring increased encouragement for participation     Vision       Perception     Praxis      Cognition Arousal/Alertness: Awake/alert Behavior During Therapy: [facility] for tasks assessed/performed;Anxious Overall Cognitive Status: Within Functional Limits for tasks assessed                                  General Comments: Pt continues to have self-limiting behaviors, opened up a little when talking about her Leilani Able - since being in the hospital she feels like everyone is telling her what to do all the time, and that her opinion doesn't matter. She wants to feel like a person and sometimes she just is self-limiting        Exercises     Shoulder Instructions       General Comments spO2on 2 L via [place] >than 97% throughout session    Pertinent Vitals/ Pain       Pain Assessment: No/denies pain Pain Intervention(s): Monitored during session;Repositioned  Home Living                                          Prior Functioning/Environment              Frequency  Min 2X/week        Progress Toward Goals  OT Goals(current goals can now be found in the care plan section)  Progress towards OT goals: Not progressing toward goals - comment(self-limiting)  Acute Rehab OT Goals Patient Stated Goal: "get this tube out of my nose" OT Goal Formulation: With patient Time For Goal Achievement: 11/23/18 Potential to Achieve Goals: Good  Plan Discharge plan remains appropriate    Co-evaluation                 AM-PAC OT "6 Clicks" Daily Activity     Outcome Measure   Help from another person eating meals?: None Help from another person taking care of personal grooming?: A Little Help from another person toileting, which includes using toliet, bedpan, or urinal?: Total Help from another person bathing (including washing, rinsing, drying)?: A Lot Help from another person to put on and taking off regular upper body clothing?: A Little Help from another person to put on and taking off regular lower body clothing?: A Lot 6 Click Score: 15    End of Session Equipment Utilized During Treatment: Oxygen  OT Visit Diagnosis: Muscle weakness (generalized) (M62.81)   Activity Tolerance Patient limited by fatigue;Other (comment)(self limiting)    Patient Left in bed;with call bell/phone within reach;with bed alarm set   Nurse Communication Mobility status(half of contrast consumed)        Time: 7544-9201 OT Time Calculation (min): 50 min  Charges: OT General Charges $OT Visit: 1 Visit OT Treatments $Self Care/Home Management : 8-22 mins $Therapeutic Activity: 23-37 mins  Hulda Humphrey OTR/L Acute Rehabilitation Services Pager: [phone] Office: [place] 11/21/2018, 4:11 PM

## 2018-11-22 DIAGNOSIS — K56609 Unspecified intestinal obstruction, unspecified as to partial versus complete obstruction: Secondary | ICD-10-CM

## 2018-11-22 LAB — GLUCOSE, CAPILLARY
Glucose-Capillary: 103 mg/dL — ABNORMAL HIGH (ref 70–99)
Glucose-Capillary: 113 mg/dL — ABNORMAL HIGH (ref 70–99)
Glucose-Capillary: 114 mg/dL — ABNORMAL HIGH (ref 70–99)
Glucose-Capillary: 130 mg/dL — ABNORMAL HIGH (ref 70–99)
Glucose-Capillary: 87 mg/dL (ref 70–99)

## 2018-11-22 NOTE — Progress Notes (Signed)
Physical Therapy Treatment Patient Details Name: Victoria Bird MRN: [id number] DOB: 1946/01/10 Today's Date: 11/22/2018    History of Present Illness COPD home oxygen admit for constipation/Ileus    PT Comments    Pt is familiar from prior admits.  Pt required MAX encouragement to participate.  Pt non compliant with any OOB activities.  Pt expressed interest in going home vs SNF.  Pt was able to sit self EOB with increased time and use of rails.  Pt static sitting EOB indep.  Pt required + 2 side by side assist to stand as pt put forth minimal effort.  Pt required MAX encouragement to just walk across room to recliner.  General Gait Details: pt required MAX encouragement.  Amb from bed to chair placed across room then from chair back to bed.  Pt c/o R LE with buckling x 2.  Very limited distance due to pt non compliance and behavior.   Pt remained on 2 lts.  present with 3/4 dyspnea requiring extended resy break. Assisted back to bed per pt request.  "I just want to lay here and sleep".   Per staff, pt is refusing to get OOB to BSC/recliner.  Pt refusing SNF rec.    Follow Up Recommendations  SNF     Equipment Recommendations  None recommended by PT    Recommendations for Other Services       Precautions / Restrictions Precautions Precautions: Fall Precaution Comments: monitor O2 sats, pt on 2L O2 at baseline Restrictions Weight Bearing Restrictions: No    Mobility  Bed Mobility Overal bed mobility: Needs Assistance Bed Mobility: Supine to Sit     Supine to sit: Min guard     General bed mobility comments: pt able with use of rail and HOB elevated.  Required increased time.    Transfers Overall transfer level: Needs assistance Equipment used: Rolling walker (2 wheeled) Transfers: Sit to/from [facility] Sit to Stand: Mod assist;+2 physical assistance;+2 safety/equipment Stand pivot transfers: Max assist;+2 physical assistance;+2 safety/equipment       General transfer comment: pt required MAX encouragement to get OOB  Ambulation/Gait Ambulation/Gait assistance: Max assist;+2 physical assistance;+2 safety/equipment Gait Distance (Feet): 14 Feet(7 feet x 2 one seated rest break) Assistive device: Rolling walker (2 wheeled) Gait Pattern/deviations: Step-to pattern;Decreased step length - right;Decreased step length - left;Shuffle;Trunk flexed Gait velocity: decreased    General Gait Details: pt required MAX encouragement.  Amb from bed to chair placed across room then from chair back to bed.  Pt c/o R LE with buckling x 2.  Very limited distance due to pt non compliance and behavior.   Pt remained on 2 lts.  present with 3/4 dyspnea requiring extended resy break.   Stairs             Wheelchair Mobility    Modified Rankin (Stroke Patients Only)       Balance                                            Cognition                                              Exercises      General Comments  Pertinent Vitals/Pain      Home Living                      Prior Function            PT Goals (current goals can now be found in the care plan section) Progress towards PT goals: Progressing toward goals    Frequency    Min 2X/week      PT Plan Current plan remains appropriate    Co-evaluation              AM-PAC PT "6 Clicks" Mobility   Outcome Measure  Help needed turning from your back to your side while in a flat bed without using bedrails?: A Little Help needed moving from lying on your back to sitting on the side of a flat bed without using bedrails?: A Lot Help needed moving to and from a bed to a chair (including a wheelchair)?: A Lot Help needed standing up from a chair using your arms (e.g., wheelchair or bedside chair)?: A Lot Help needed to walk in hospital room?: A Lot Help needed climbing 3-5 steps with a railing? : Total 6 Click  Score: 12    End of Session Equipment Utilized During Treatment: Oxygen Activity Tolerance: Patient limited by fatigue;Other (comment)(pt self limiting, non compliant ) Patient left: in bed;with call bell/phone within reach;with bed alarm set Nurse Communication: Mobility status PT Visit Diagnosis: Muscle weakness (generalized) (M62.81);Other abnormalities of gait and mobility (R26.89)     Time: 0301-3143 PT Time Calculation (min) (ACUTE ONLY): 27 min  Charges:  $Gait Training: 8-22 mins $Therapeutic Activity: 8-22 mins                     Rica Koyanagi  PTA Acute  Rehabilitation Services Pager      [phone] Office      [phone]

## 2018-11-22 NOTE — Progress Notes (Signed)
PMT progress note  The patient is resting in bed, she has NGT which is currently clamped.   Patient has been started on sips of clear liquids which she is tolerating.   She states she did have a small bowel movement in the past 24 hours.  CT abdomen results noted, discussed with TRH MD.   BP (!) 157/76 (BP Location: Right Arm)   Pulse 90   Temp (!) 97.3 F (36.3 C) (Oral)   Resp 16   Ht 5\' 3"  (1.6 m)   Wt 74.3 kg   SpO2 98%   BMI 29.02 kg/m  Awake alert Has NGT Abdomen is not distended, not tender  She does have faint bowel sounds S1 S2 Clear  No edema Non focal  PPS 40%  Ileus versus SBO. CT abdomen shows possible infectious/inflammatory enteritis.  Acute on chronic hypoxic, hypercapnic respiratory failure secondary to COPD exacerbation. Human metapneumovirus Debility, deconditioning.   Ongoing goals of care discussions with patient. She is awake alert, oriented. She wishes to be in charge of all of her medical decision making.   She wants to continue with PO.   We will continue to try to find ways to engage with her and to establish a trust relationship.  She has no designated Psychiatrist.  At the time of the initial consult, I had discussed with the patient's daughter, who stated that the patient has always refused advanced directives discussions.  Continue current mode of care.  She will likely go to home with home health care on discharge.   25 minutes spent Loistine Chance MD [facility] health palliative medicine team. [phone].

## 2018-11-22 NOTE — Progress Notes (Signed)
Victoria Bird  GYJ:[id number] DOB: 10-06-1945 DOA: 11/06/2018 PCP: Hoyt Koch, MD    Brief Narrative:   Patient is a 73 year old female history of chronic respiratory failure on 2 L nasal cannula chronically secondary to severe COPD with very severe obstruction on spirometry of July 2019, chronic kidney disease stage III- 4, former tobacco user quit in 2019, hypertension presented to the ED with worsening shortness of breath and was admitted for an acute COPD exacerbation. Patient on admission was placed in the stepdown unit and placed on BiPAP, IV steroids, scheduled nebulizer treatments. Patient subsequently improved was taken off BiPAP and transferred to the telemetry floor. Patient still with ongoing poor air movement. On 11/29/2018 patient with worsening shortness of breath, use of accessory muscles of respiration, and subsequently transferred back to the stepdown unit to be placed on BiPAP. During stepdown stay, patient did have constipation and ileus.  Surgery was consulted.  Surgery recommended conservative treatment but patient had compliance issues.  Subsequently patient was transferred out of the stepdown unit but developed more abdominal distention and vomiting.  She was then placed on a NG tube with intermittent suction with mild improvement in her symptoms.  Family requested palliative care evaluation for her ongoing symptoms.   Assessment & Plan:   Principal Problem:   Acute on chronic respiratory failure with hypoxia and hypercapnia ([name]) Active Problems:   Hypertension   GERD (gastroesophageal reflux disease)   OSA (obstructive sleep apnea)   Diabetes mellitus type 2, controlled ([name])   COPD exacerbation ([name])   Tobacco abuse   COPD with acute exacerbation ([name])   Chronic respiratory failure with hypoxia ([name])   Acute exacerbation of chronic obstructive pulmonary disease (COPD) ([name])   Malnutrition of moderate degree   ARF (acute renal  failure) ([name])   Hyperkalemia   Abdominal discomfort   Palliative care by specialist   Goals of care, counseling/discussion   Intestinal occlusion ([name])   Abdominal distention, constipation Ileus versus small bowel obstruction Symptomatically better with NG tube with less abdominal distention and less nausea but has not had a bowel movement,  but reports flatus.  KUB 2/18 with small bowel dilation consistent with partial/early high-grade SBO.  She had been seen by surgery who signed off on the patient because of lack of compliance to contrast study, enema and other treatments. --CT abd/pelvis 2/20 w/ findings of dilated prox small bowel loops with mid bowel perienteric edema/inflammation; contrast able to migrate through terminal ileum and right colon --Will clamp NG tube today --Continue on D5 1/2 NS  --Trial clear liquid diet --Palliative care following for assistance with goals of care; appreciate assistance --Also if no improvement may need to rediscuss need for surgical intervention with the patient, which she is been adamantly against during this hospitalization  Acute on chronic hypoxic, hypercapnic respiratory failure secondary to COPD exacerbation.  Human metapneumovirus Respiratory viral panel was positive for metapneumovirus.    Completed 4-day course of Levaquin. --Continue nebs as needed --start to taper off of Solu-Medrol --Continues on supplemental oxygen, 3LNC (on 2LNC at home); continue to titrate for SPO2 >88%  Hypernatremia Sodium up to 147 on 2/19.  Etiology likely secondary to IV fluids vs dehydration.  --Improved to 144 on D5 half-normal saline at 125 mL/h --daily BMP  Leukocytosis. Likely secondary to steroids.  Trending down. --Continue to monitor CBC daily --Start to taper off Solu-Medrol  History of GERD.  Continue PPI.  Acute kidney injury on chronic  kidney disease stage III.     Renal ultrasound showed echogenic kidneys.  No hydronephrosis.     Etiology likely secondary to dehydration from ileus. --Creatinine improving,2.76-->1.90-->1.60-->1.42 --Continue IV fluid hydration while n.p.o. --Continue monitor renal function daily  Moderate protein calorie malnutrition --Clamp NG tube today --Continue D5 1/2 NS @ 147ml.hr. --We will start trial clear liquid diet as above  Hypertension Continue with hydralazine as needed for hypertension.  On Flomax.  Unable to use PO at this time.  Anemiaof chronic disease Stable at this time, but globin slightly drifting down with some dark red output in NGT output --Closely monitor CBC; may need to discontinue heparin DVT prophylaxis  Diabetes mellitus type 2 Hemoglobin A1c 6.1 on 11/08/2018.  On Lantus and sliding scale insulin every 6 hourly.  Closely monitor blood glucose levels.  Urinary retention: We will need to closely monitor.  Recently had a Foley catheter  Hyperkalemia: resolved --continue to monitor BMP with magnesium daily  Enlarged abdominal aortic aneurysm Subtle finding on CT abdomen/pelvis with AAA measuring 4.6 cm in diameter.  Recommend follow-up abdomen/pelvis CTA in 6 months and outpatient vascular surgery referral.  Noncompliance to treatment plan Debility, deconditioning. Intermittent.  Not motivated for physical therapy.  Initial PT evaluation recommended SNF placement; patient adamantly refuses and wishes to return home.  Not participating with physical therapy and not ambulating.  Discussed with patient if she would like to return home she will have to participate to ensure she is safe for discharge home when ready.  I have been continually encouraging the patient for participation.     DVT prophylaxis: Heparin Code Status: DNR Family Communication: None Disposition Plan: Continue inpatient level of care   Consultants:   General surgery - signed off as patient declined surgical intervention  Pulmonary/critical care medicine  Procedures:   VQ scan  11/08/2018: Low probability PE  Renal ultrasound 11/09/2018: Bilateral echogenic kidneys, no hydronephrosis/mass  Antimicrobials:   Levaquin 2/5-2/9   Subjective:  Patient seen and examined at bedside, resting comfortably in bed.  Reports bowel movement overnight.  Completed CT abdomen/pelvis with contrast able to migrate to right colon.  States would like to trial clear liquid diet. Denies any nausea or abdominal pain today.  No other complaints.  Denies headache, no chest pain, no palpitations, no nausea/vomiting/diarrhea, no fever/chills/night sweats.  No other concerns overnight per nursing staff.  Objective: Vitals:   11/21/18 1401 11/21/18 2223 11/22/18 0548 11/22/18 0558  BP: (!) 152/81 (!) 155/77 (!) 157/76   Pulse: 77 86 90   Resp: (!) 22 14 16    Temp: 97.9 F (36.6 C) 98.1 F (36.7 C) (!) 97.3 F (36.3 C)   TempSrc: Oral Oral Oral   SpO2: 100% 97% 98%   Weight:    74.3 kg  Height:        Intake/Output Summary (Last 24 hours) at 11/22/2018 1249 Last data filed at 11/22/2018 3382 Gross per 24 hour  Intake 2765.39 ml  Output 900 ml  Net 1865.39 ml   Filed Weights   11/20/18 0629 11/21/18 0558 11/22/18 0558  Weight: 71.3 kg 73.3 kg 74.3 kg    Examination:  General exam: Appears calm and comfortable  Respiratory system: Clear to auscultation. Respiratory effort normal. On 3LNC Cardiovascular system: S1 & S2 heard, RRR. No JVD, murmurs, rubs, gallops or clicks. No pedal edema. Gastrointestinal system: Abdomen is nondistended, soft and nontender. No organomegaly or masses felt.  Very faint bowel sounds Central nervous system: Alert and oriented. No  focal neurological deficits. Extremities: Symmetric 5 x 5 power. Skin: No rashes, lesions or ulcers Psychiatry: Judgement and insight appear normal. Mood & affect appropriate.     Data Reviewed: I have personally reviewed following labs and imaging studies  CBC: Recent Labs  Lab 11/17/18 0356 11/18/18 0500  11/19/18 1138 11/20/18 0604 11/21/18 0606  WBC 17.1* 19.4* 13.9* 9.7 10.3  HGB 12.1 11.7* 10.6* 9.7* 9.9*  HCT 40.8 38.7 35.0* 33.5* 34.3*  MCV 88.7 91.5 92.3 92.5 94.5  PLT 240 221 187 181 213   Basic Metabolic Panel: Recent Labs  Lab 11/17/18 0356 11/18/18 0500 11/19/18 1138 11/20/18 0604 11/21/18 0606  NA 136 138 144 147* 144  K 5.0 4.3 4.0 3.6 3.1*  CL 96* 101 112* 117* 114*  CO2 27 27 26 26 26   GLUCOSE 144* 94 124* 168* 128*  BUN 83* 90* 58* 37* 26*  CREATININE 2.51* 2.76* 1.90* 1.60* 1.42*  CALCIUM 8.7* 8.5* 8.3* 8.2* 7.9*  MG  --  2.3  --  2.1 1.7   GFR: Estimated Creatinine Clearance: 35.1 mL/min (A) (by C-G formula based on SCr of 1.42 mg/dL (H)). Liver Function Tests: Recent Labs  Lab 11/20/18 0604  AST 38  ALT 134*  ALKPHOS 39  BILITOT 0.7  PROT 4.9*  ALBUMIN 2.8*   No results for input(s): LIPASE, AMYLASE in the last 168 hours. No results for input(s): AMMONIA in the last 168 hours. Coagulation Profile: No results for input(s): INR, PROTIME in the last 168 hours. Cardiac Enzymes: No results for input(s): CKTOTAL, CKMB, CKMBINDEX, TROPONINI in the last 168 hours. BNP (last 3 results) Recent Labs    04/10/18 1715  PROBNP 37.0   HbA1C: No results for input(s): HGBA1C in the last 72 hours. CBG: Recent Labs  Lab 11/21/18 0557 11/21/18 1202 11/21/18 1807 11/22/18 0002 11/22/18 0602  GLUCAP 112* 133* 90 114* 103*   Lipid Profile: No results for input(s): CHOL, HDL, LDLCALC, TRIG, CHOLHDL, LDLDIRECT in the last 72 hours. Thyroid Function Tests: No results for input(s): TSH, T4TOTAL, FREET4, T3FREE, THYROIDAB in the last 72 hours. Anemia Panel: No results for input(s): VITAMINB12, FOLATE, FERRITIN, TIBC, IRON, RETICCTPCT in the last 72 hours. Sepsis Labs: No results for input(s): PROCALCITON, LATICACIDVEN in the last 168 hours.  No results found for this or any previous visit (from the past 240 hour(s)).       Radiology Studies: Dg  Abd 1 View  Result Date: 11/21/2018 CLINICAL DATA:  Abdominal pain. EXAM: ABDOMEN - 1 VIEW COMPARISON:  Radiograph of November 17, 2018. FINDINGS: Distal tip of nasogastric tube is seen in expected position of distal stomach. Continued small bowel dilatation is noted concerning for distal small bowel obstruction. No colonic dilatation is noted. Stool is noted in the colon. Status post cholecystectomy. IMPRESSION: Continued small bowel dilatation is noted concerning for distal small bowel obstruction. Electronically Signed   By: Marijo Conception, M.D.   On: 11/21/2018 09:16   Ct Abdomen Pelvis W Contrast  Result Date: 11/21/2018 CLINICAL DATA:  Abdominal pain with nausea vomiting. Small bowel obstruction. EXAM: CT ABDOMEN AND PELVIS WITH CONTRAST TECHNIQUE: Multidetector CT imaging of the abdomen and pelvis was performed using the standard protocol following bolus administration of intravenous contrast. CONTRAST:  25mL OMNIPAQUE IOHEXOL 300 MG/ML SOLN, 55mL OMNIPAQUE IOHEXOL 300 MG/ML SOLN COMPARISON:  12/16/2016 FINDINGS: Lower chest: Multiple pulmonary nodules are identified in the lung bases bilaterally, measuring up to 6 mm (left lower lobe on image 26/4). This nodule  and the other basilar nodules were present on the previous study from 2 years ago that are not substantially changed consistent with benign etiology. Hepatobiliary: No suspicious focal abnormality within the liver parenchyma. Gallbladder surgically absent. No intrahepatic or extrahepatic biliary dilation. Pancreas: No focal mass lesion. No dilatation of the main duct. No intraparenchymal cyst. No peripancreatic edema. Spleen: Scattered tiny hypoattenuating foci in the splenic parenchyma are slightly more conspicuous than previous, but were evident on the prior exam. Adrenals/Urinary Tract: No adrenal nodule or mass. Kidneys unremarkable aside from bilateral central sinus cysts. No evidence for hydroureter. The urinary bladder appears normal for  the degree of distention. Stomach/Bowel: Stomach is decompressed. NG tube tip is in the antrum. Duodenum is normally positioned as is the ligament of Treitz. Small bowel loops are distended proximally, measuring up to 3.7 cm diameter. Distended loops track into a 15-20 cm segment of mid small bowel that demonstrates mild circumferential wall thickening but no substantial perienteric edema/inflammation (image 62/2). Small bowel distal to this point appears decompressed although contrast has migrated through to the terminal ileum and right colon. Diverticular changes are noted in the left colon without evidence of diverticulitis. Mild circumferential wall thickening is identified in the distal rectum (78/2). Vascular/Lymphatic: Abdominal aortic aneurysm measures up to 4.6 cm diameter. There is no gastrohepatic or hepatoduodenal ligament lymphadenopathy. No intraperitoneal or retroperitoneal lymphadenopathy. No pelvic sidewall lymphadenopathy. Reproductive: Uterus surgically absent.  There is no adnexal mass. Other: No intraperitoneal free fluid. Musculoskeletal: No worrisome lytic or sclerotic osseous abnormality. IMPRESSION: 1. Dilated proximal small bowel loops measuring up to 3.7 cm diameter. Distended loops extend into a 15-20 cm segment of mid small bowel which demonstrates mild circumferential wall thickening, but no substantial peri-enteric edema or inflammation. Features may be related to an infectious/inflammatory enteritis. No similar features were visible on the prior study to suggest chronic stricture. Small bowel distal to this point appears decompressed although contrast has migrated through to the terminal ileum and right colon. 2. Mild circumferential wall thickening distal rectum. Consider infectious/inflammatory proctitis. 3. Abdominal aortic aneurysm measuring up to 4.6 cm diameter. Recommend followup by abdomen and pelvis CTA in 6 months, and vascular surgery referral/consultation if not already  obtained. This recommendation follows ACR consensus guidelines: White Paper of the ACR Incidental Findings Committee II on Vascular Findings. J Am Coll Radiol 2013; 10:789-794. Aortic aneurysm NOS (ICD10-I71.9) 4. Scattered basilar bilateral pulmonary nodules measure up to 6 mm, stable since 12/16/2016 compatible with benign etiology. Electronically Signed   By: Misty Stanley M.D.   On: 11/21/2018 18:42        Scheduled Meds: . bisacodyl  10 mg Rectal Daily  . fluticasone  2 spray Each Nare Daily  . heparin  5,000 Units Subcutaneous Q8H  . insulin aspart  0-9 Units Subcutaneous Q6H  . insulin glargine  10 Units Subcutaneous Daily  . mouth rinse  15 mL Mouth Rinse BID  . methylPREDNISolone (SOLU-MEDROL) injection  20 mg Intravenous Daily  . pantoprazole (PROTONIX) IV  40 mg Intravenous Q12H  . sodium chloride flush  3 mL Intravenous Q12H  . tamsulosin  0.4 mg Oral QPC supper   Continuous Infusions: . dextrose 5 % and 0.45% NaCl 125 mL/hr at 11/21/18 2320     LOS: 16 days    Time spent: 27 minutes    Lia Hopping [facility] Pager [phone]  If 7PM-7AM, please contact night-coverage [email or web address] Password [facility] 11/22/2018, 12:49 PM

## 2018-11-23 LAB — CBC
HCT: 30.1 % — ABNORMAL LOW (ref 36.0–46.0)
Hemoglobin: 9 g/dL — ABNORMAL LOW (ref 12.0–15.0)
MCH: 27.1 pg (ref 26.0–34.0)
MCHC: 29.9 g/dL — ABNORMAL LOW (ref 30.0–36.0)
MCV: 90.7 fL (ref 80.0–100.0)
Platelets: 125 10*3/uL — ABNORMAL LOW (ref 150–400)
RBC: 3.32 MIL/uL — AB (ref 3.87–5.11)
RDW: 14.5 % (ref 11.5–15.5)
WBC: 8.3 10*3/uL (ref 4.0–10.5)
nRBC: 0 % (ref 0.0–0.2)

## 2018-11-23 LAB — BASIC METABOLIC PANEL
ANION GAP: 6 (ref 5–15)
BUN: 20 mg/dL (ref 8–23)
CO2: 25 mmol/L (ref 22–32)
Calcium: 7.4 mg/dL — ABNORMAL LOW (ref 8.9–10.3)
Chloride: 108 mmol/L (ref 98–111)
Creatinine, Ser: 1.32 mg/dL — ABNORMAL HIGH (ref 0.44–1.00)
GFR calc non Af Amer: 40 mL/min — ABNORMAL LOW (ref >60)
GFR, EST AFRICAN AMERICAN: 47 mL/min — AB (ref >60)
Glucose, Bld: 104 mg/dL — ABNORMAL HIGH (ref 70–99)
Potassium: 2.9 mmol/L — ABNORMAL LOW (ref 3.5–5.1)
Sodium: 139 mmol/L (ref 135–145)

## 2018-11-23 LAB — GLUCOSE, CAPILLARY
GLUCOSE-CAPILLARY: 102 mg/dL — AB (ref 70–99)
Glucose-Capillary: 104 mg/dL — ABNORMAL HIGH (ref 70–99)
Glucose-Capillary: 116 mg/dL — ABNORMAL HIGH (ref 70–99)

## 2018-11-23 LAB — MAGNESIUM: Magnesium: 1.5 mg/dL — ABNORMAL LOW (ref 1.7–2.4)

## 2018-11-23 MED ORDER — POTASSIUM CHLORIDE CRYS ER 20 MEQ PO TBCR
40.0000 meq | EXTENDED_RELEASE_TABLET | ORAL | Status: AC
  Administered 2018-11-23: 40 meq via ORAL
  Filled 2018-11-23 (×2): qty 2

## 2018-11-23 NOTE — Progress Notes (Signed)
PROGRESS NOTE    Victoria Bird  UDJ:[id number] DOB: January 27, 1946 DOA: 11/06/2018 PCP: Hoyt Koch, MD    Brief Narrative:   Patient is a 73 year old female history of chronic respiratory failure on 2 L nasal cannula chronically secondary to severe COPD with very severe obstruction on spirometry of July 2019, chronic kidney disease stage III- 4, former tobacco user quit in 2019, hypertension presented to the ED with worsening shortness of breath and was admitted for an acute COPD exacerbation. Patient on admission was placed in the stepdown unit and placed on BiPAP, IV steroids, scheduled nebulizer treatments. Patient subsequently improved was taken off BiPAP and transferred to the telemetry floor. Patient still with ongoing poor air movement. On 11/29/2018 patient with worsening shortness of breath, use of accessory muscles of respiration, and subsequently transferred back to the stepdown unit to be placed on BiPAP. During stepdown stay, patient did have constipation and ileus.  Surgery was consulted.  Surgery recommended conservative treatment but patient had compliance issues.  Subsequently patient was transferred out of the stepdown unit but developed more abdominal distention and vomiting.  She was then placed on a NG tube with intermittent suction with mild improvement in her symptoms.  Family requested palliative care evaluation for her ongoing symptoms.   Assessment & Plan:   Principal Problem:   Acute on chronic respiratory failure with hypoxia and hypercapnia ([name]) Active Problems:   Hypertension   GERD (gastroesophageal reflux disease)   OSA (obstructive sleep apnea)   Diabetes mellitus type 2, controlled ([name])   COPD exacerbation ([name])   Tobacco abuse   COPD with acute exacerbation ([name])   Chronic respiratory failure with hypoxia ([name])   Acute exacerbation of chronic obstructive pulmonary disease (COPD) ([name])   Malnutrition of moderate degree   ARF (acute renal  failure) ([name])   Hyperkalemia   Abdominal discomfort   Palliative care by specialist   Goals of care, counseling/discussion   Intestinal occlusion ([name])   Abdominal distention, constipation Ileus versus small bowel obstruction Symptomatically better with NG tube with less abdominal distention and less nausea but has not had a bowel movement,  but reports flatus.  KUB 2/18 with small bowel dilation consistent with partial/early high-grade SBO.  She had been seen by surgery who signed off on the patient because of lack of compliance to contrast study, enema and other treatments. --CT abd/pelvis 2/20 w/ findings of dilated prox small bowel loops with mid bowel perienteric edema/inflammation; contrast able to migrate through terminal ileum and right colon --Continue to clamp NG tube --Discontinue IV fluids today --Advance diet to full liquids today --Palliative care following for assistance with goals of care; appreciate assistance --Also if no improvement may need to rediscuss need for surgical intervention with the patient, which she is been adamantly against during this hospitalization  Acute on chronic hypoxic, hypercapnic respiratory failure secondary to COPD exacerbation.  Human metapneumovirus Respiratory viral panel was positive for metapneumovirus.    Completed 4-day course of Levaquin. --Continue nebs as needed --Steroid taper complete --Continues on supplemental oxygen, 2LNC (on 2LNC at home); continue to titrate for SPO2 >88%  Hypernatremia Sodium up to 147 on 2/19.  Etiology likely secondary to IV fluids vs dehydration.  --Improved to 139  --DC'd IV fluids today, advance diet to full liquid --daily BMP  Leukocytosis. Likely secondary to steroids.    Resolved --Steroid taper complete --Continue to monitor CBC daily  History of GERD.  Continue PPI.  Acute kidney injury on chronic kidney  disease stage III.     Renal ultrasound showed echogenic kidneys, no  hydronephrosis. Etiology likely secondary to dehydration from ileus. --Creatinine improving,2.76-->1.90-->1.60-->1.42-->1.32 --Continue monitor renal function daily  Moderate protein calorie malnutrition --Clamp NG tube, d/c IVF --advance diet as above  Hypertension Continue with hydralazine as needed for hypertension.  On Flomax.    Anemiaof chronic disease Stable at this time --Closely monitor CBC  Diabetes mellitus type 2 Hemoglobin A1c 6.1 on 11/08/2018.  On Lantus and sliding scale insulin every 6 hourly.  Closely monitor blood glucose levels.  Urinary retention: We will need to closely monitor.  Recently had a Foley catheter  Hyperkalemia:  Potassium 2.9 today, will replete --continue to monitor BMP with magnesium daily  Enlarged abdominal aortic aneurysm Subtle finding on CT abdomen/pelvis with AAA measuring 4.6 cm in diameter.  Recommend follow-up abdomen/pelvis CTA in 6 months and outpatient vascular surgery referral.  Noncompliance to treatment plan Debility, deconditioning. Intermittent.  Not motivated for physical therapy.  Initial PT evaluation recommended SNF placement; patient adamantly refuses and wishes to return home.  Not participating with physical therapy and not ambulating.  Discussed with patient if she would like to return home she will have to participate to ensure she is safe for discharge home when ready.  I have been continually encouraging the patient for participation.     DVT prophylaxis: Heparin Code Status: DNR Family Communication: None Disposition Plan: Continue inpatient level of care, SNF when medically stable   Consultants:   General surgery - signed off as patient declined surgical intervention  Pulmonary/critical care medicine  Palliative care  Procedures:   VQ scan 11/08/2018: Low probability PE  Renal ultrasound 11/09/2018: Bilateral echogenic kidneys, no hydronephrosis/mass  Antimicrobials:   Levaquin  2/5-2/9   Subjective:  Patient seen and examined at bedside, resting comfortably in bed. Denies any nausea or abdominal pain today.  Tolerated clear liquid diet today, would like to advance.  No other complaints.  Denies headache, no chest pain, no palpitations, no nausea/vomiting/diarrhea, no fever/chills/night sweats.  No other concerns overnight per nursing staff.  Objective: Vitals:   11/22/18 1700 11/22/18 2348 11/23/18 0500 11/23/18 0553  BP: 137/68 (!) 149/69  (!) 150/75  Pulse: 87 86  91  Resp: 18 16  16   Temp: 98.1 F (36.7 C) 97.9 F (36.6 C)  97.7 F (36.5 C)  TempSrc: Oral Oral  Oral  SpO2: 99% 99%  99%  Weight:   74.5 kg   Height:        Intake/Output Summary (Last 24 hours) at 11/23/2018 1219 Last data filed at 11/23/2018 1059 Gross per 24 hour  Intake 2348.82 ml  Output 1200 ml  Net 1148.82 ml   Filed Weights   11/21/18 0558 11/22/18 0558 11/23/18 0500  Weight: 73.3 kg 74.3 kg 74.5 kg    Examination:  General exam: Appears calm and comfortable  Respiratory system: Clear to auscultation. Respiratory effort normal. On 3LNC Cardiovascular system: S1 & S2 heard, RRR. No JVD, murmurs, rubs, gallops or clicks. No pedal edema. Gastrointestinal system: Abdomen is nondistended, soft and nontender. No organomegaly or masses felt.  Faint bowel sounds. Central nervous system: Alert and oriented. No focal neurological deficits. Extremities: Symmetric 5 x 5 power. Skin: No rashes, lesions or ulcers Psychiatry: Judgement and insight appear normal. Mood & affect appropriate.     Data Reviewed: I have personally reviewed following labs and imaging studies  CBC: Recent Labs  Lab 11/18/18 0500 11/19/18 1138 11/20/18 0604 11/21/18 0606 11/23/18  0510  WBC 19.4* 13.9* 9.7 10.3 8.3  HGB 11.7* 10.6* 9.7* 9.9* 9.0*  HCT 38.7 35.0* 33.5* 34.3* 30.1*  MCV 91.5 92.3 92.5 94.5 90.7  PLT 221 187 181 154 536*   Basic Metabolic Panel: Recent Labs  Lab 11/18/18 0500  11/19/18 1138 11/20/18 0604 11/21/18 0606 11/23/18 0510  NA 138 144 147* 144 139  K 4.3 4.0 3.6 3.1* 2.9*  CL 101 112* 117* 114* 108  CO2 27 26 26 26 25   GLUCOSE 94 124* 168* 128* 104*  BUN 90* 58* 37* 26* 20  CREATININE 2.76* 1.90* 1.60* 1.42* 1.32*  CALCIUM 8.5* 8.3* 8.2* 7.9* 7.4*  MG 2.3  --  2.1 1.7 1.5*   GFR: Estimated Creatinine Clearance: 37.8 mL/min (A) (by C-G formula based on SCr of 1.32 mg/dL (H)). Liver Function Tests: Recent Labs  Lab 11/20/18 0604  AST 38  ALT 134*  ALKPHOS 39  BILITOT 0.7  PROT 4.9*  ALBUMIN 2.8*   No results for input(s): LIPASE, AMYLASE in the last 168 hours. No results for input(s): AMMONIA in the last 168 hours. Coagulation Profile: No results for input(s): INR, PROTIME in the last 168 hours. Cardiac Enzymes: No results for input(s): CKTOTAL, CKMB, CKMBINDEX, TROPONINI in the last 168 hours. BNP (last 3 results) Recent Labs    04/10/18 1715  PROBNP 37.0   HbA1C: No results for input(s): HGBA1C in the last 72 hours. CBG: Recent Labs  Lab 11/22/18 1444 11/22/18 1731 11/22/18 2351 11/23/18 0555 11/23/18 1130  GLUCAP 130* 87 113* 102* 104*   Lipid Profile: No results for input(s): CHOL, HDL, LDLCALC, TRIG, CHOLHDL, LDLDIRECT in the last 72 hours. Thyroid Function Tests: No results for input(s): TSH, T4TOTAL, FREET4, T3FREE, THYROIDAB in the last 72 hours. Anemia Panel: No results for input(s): VITAMINB12, FOLATE, FERRITIN, TIBC, IRON, RETICCTPCT in the last 72 hours. Sepsis Labs: No results for input(s): PROCALCITON, LATICACIDVEN in the last 168 hours.  No results found for this or any previous visit (from the past 240 hour(s)).       Radiology Studies: Ct Abdomen Pelvis W Contrast  Result Date: 11/21/2018 CLINICAL DATA:  Abdominal pain with nausea vomiting. Small bowel obstruction. EXAM: CT ABDOMEN AND PELVIS WITH CONTRAST TECHNIQUE: Multidetector CT imaging of the abdomen and pelvis was performed using the  standard protocol following bolus administration of intravenous contrast. CONTRAST:  90mL OMNIPAQUE IOHEXOL 300 MG/ML SOLN, 6mL OMNIPAQUE IOHEXOL 300 MG/ML SOLN COMPARISON:  12/16/2016 FINDINGS: Lower chest: Multiple pulmonary nodules are identified in the lung bases bilaterally, measuring up to 6 mm (left lower lobe on image 26/4). This nodule and the other basilar nodules were present on the previous study from 2 years ago that are not substantially changed consistent with benign etiology. Hepatobiliary: No suspicious focal abnormality within the liver parenchyma. Gallbladder surgically absent. No intrahepatic or extrahepatic biliary dilation. Pancreas: No focal mass lesion. No dilatation of the main duct. No intraparenchymal cyst. No peripancreatic edema. Spleen: Scattered tiny hypoattenuating foci in the splenic parenchyma are slightly more conspicuous than previous, but were evident on the prior exam. Adrenals/Urinary Tract: No adrenal nodule or mass. Kidneys unremarkable aside from bilateral central sinus cysts. No evidence for hydroureter. The urinary bladder appears normal for the degree of distention. Stomach/Bowel: Stomach is decompressed. NG tube tip is in the antrum. Duodenum is normally positioned as is the ligament of Treitz. Small bowel loops are distended proximally, measuring up to 3.7 cm diameter. Distended loops track into a 15-20 cm segment of  mid small bowel that demonstrates mild circumferential wall thickening but no substantial perienteric edema/inflammation (image 62/2). Small bowel distal to this point appears decompressed although contrast has migrated through to the terminal ileum and right colon. Diverticular changes are noted in the left colon without evidence of diverticulitis. Mild circumferential wall thickening is identified in the distal rectum (78/2). Vascular/Lymphatic: Abdominal aortic aneurysm measures up to 4.6 cm diameter. There is no gastrohepatic or hepatoduodenal ligament  lymphadenopathy. No intraperitoneal or retroperitoneal lymphadenopathy. No pelvic sidewall lymphadenopathy. Reproductive: Uterus surgically absent.  There is no adnexal mass. Other: No intraperitoneal free fluid. Musculoskeletal: No worrisome lytic or sclerotic osseous abnormality. IMPRESSION: 1. Dilated proximal small bowel loops measuring up to 3.7 cm diameter. Distended loops extend into a 15-20 cm segment of mid small bowel which demonstrates mild circumferential wall thickening, but no substantial peri-enteric edema or inflammation. Features may be related to an infectious/inflammatory enteritis. No similar features were visible on the prior study to suggest chronic stricture. Small bowel distal to this point appears decompressed although contrast has migrated through to the terminal ileum and right colon. 2. Mild circumferential wall thickening distal rectum. Consider infectious/inflammatory proctitis. 3. Abdominal aortic aneurysm measuring up to 4.6 cm diameter. Recommend followup by abdomen and pelvis CTA in 6 months, and vascular surgery referral/consultation if not already obtained. This recommendation follows ACR consensus guidelines: White Paper of the ACR Incidental Findings Committee II on Vascular Findings. J Am Coll Radiol 2013; 10:789-794. Aortic aneurysm NOS (ICD10-I71.9) 4. Scattered basilar bilateral pulmonary nodules measure up to 6 mm, stable since 12/16/2016 compatible with benign etiology. Electronically Signed   By: Misty Stanley M.D.   On: 11/21/2018 18:42        Scheduled Meds: . bisacodyl  10 mg Rectal Daily  . fluticasone  2 spray Each Nare Daily  . heparin  5,000 Units Subcutaneous Q8H  . insulin aspart  0-9 Units Subcutaneous Q6H  . insulin glargine  10 Units Subcutaneous Daily  . mouth rinse  15 mL Mouth Rinse BID  . pantoprazole (PROTONIX) IV  40 mg Intravenous Q12H  . potassium chloride  40 mEq Oral Q4H  . sodium chloride flush  3 mL Intravenous Q12H  . tamsulosin   0.4 mg Oral QPC supper   Continuous Infusions: . dextrose 5 % and 0.45% NaCl 125 mL/hr at 11/23/18 0318     LOS: 17 days    Time spent: 27 minutes    Lia Hopping [facility] Pager [phone]  If 7PM-7AM, please contact night-coverage [email or web address] Password [facility] 11/23/2018, 12:19 PM

## 2018-11-24 LAB — MAGNESIUM: Magnesium: 1.4 mg/dL — ABNORMAL LOW (ref 1.7–2.4)

## 2018-11-24 LAB — CBC
HCT: 31.4 % — ABNORMAL LOW (ref 36.0–46.0)
Hemoglobin: 9.5 g/dL — ABNORMAL LOW (ref 12.0–15.0)
MCH: 27.5 pg (ref 26.0–34.0)
MCHC: 30.3 g/dL (ref 30.0–36.0)
MCV: 91 fL (ref 80.0–100.0)
Platelets: 130 10*3/uL — ABNORMAL LOW (ref 150–400)
RBC: 3.45 MIL/uL — ABNORMAL LOW (ref 3.87–5.11)
RDW: 14.6 % (ref 11.5–15.5)
WBC: 9.2 10*3/uL (ref 4.0–10.5)
nRBC: 0 % (ref 0.0–0.2)

## 2018-11-24 LAB — BASIC METABOLIC PANEL
Anion gap: 6 (ref 5–15)
BUN: 19 mg/dL (ref 8–23)
CO2: 25 mmol/L (ref 22–32)
Calcium: 7.9 mg/dL — ABNORMAL LOW (ref 8.9–10.3)
Chloride: 109 mmol/L (ref 98–111)
Creatinine, Ser: 1.3 mg/dL — ABNORMAL HIGH (ref 0.44–1.00)
GFR calc Af Amer: 48 mL/min — ABNORMAL LOW (ref >60)
GFR calc non Af Amer: 41 mL/min — ABNORMAL LOW (ref >60)
Glucose, Bld: 101 mg/dL — ABNORMAL HIGH (ref 70–99)
Potassium: 3.5 mmol/L (ref 3.5–5.1)
Sodium: 140 mmol/L (ref 135–145)

## 2018-11-24 LAB — GLUCOSE, CAPILLARY
GLUCOSE-CAPILLARY: 90 mg/dL (ref 70–99)
Glucose-Capillary: 88 mg/dL (ref 70–99)
Glucose-Capillary: 122 mg/dL — ABNORMAL HIGH (ref 70–99)
Glucose-Capillary: 138 mg/dL — ABNORMAL HIGH (ref 70–99)

## 2018-11-24 MED ORDER — HYDROCODONE-ACETAMINOPHEN 5-325 MG PO TABS
1.0000 | ORAL_TABLET | Freq: Four times a day (QID) | ORAL | Status: DC | PRN
  Administered 2018-11-24 – 2018-11-30 (×8): 1 via ORAL
  Filled 2018-11-24 (×9): qty 1

## 2018-11-24 MED ORDER — MAGNESIUM SULFATE 2 GM/50ML IV SOLN
2.0000 g | Freq: Once | INTRAVENOUS | Status: AC
  Administered 2018-11-24: 2 g via INTRAVENOUS
  Filled 2018-11-24: qty 50

## 2018-11-24 MED ORDER — POTASSIUM CHLORIDE CRYS ER 20 MEQ PO TBCR
40.0000 meq | EXTENDED_RELEASE_TABLET | Freq: Two times a day (BID) | ORAL | Status: DC
  Administered 2018-11-24 – 2018-11-29 (×9): 40 meq via ORAL
  Filled 2018-11-24 (×11): qty 2

## 2018-11-24 MED ORDER — ACETAMINOPHEN 325 MG PO TABS: 650.0000 mg | ORAL_TABLET | Freq: Four times a day (QID) | ORAL | Status: DC | PRN

## 2018-11-24 NOTE — Progress Notes (Signed)
PROGRESS NOTE    Victoria Bird  POE:[id number] DOB: 02/11/1946 DOA: 11/06/2018 PCP: Hoyt Koch, MD    Brief Narrative:   Patient is a 73 year old female history of chronic respiratory failure on 2 L nasal cannula chronically secondary to severe COPD with very severe obstruction on spirometry of July 2019, chronic kidney disease stage III- 4, former tobacco user quit in 2019, hypertension presented to the ED with worsening shortness of breath and was admitted for an acute COPD exacerbation. Patient on admission was placed in the stepdown unit and placed on BiPAP, IV steroids, scheduled nebulizer treatments. Patient subsequently improved was taken off BiPAP and transferred to the telemetry floor. Patient still with ongoing poor air movement. On 11/29/2018 patient with worsening shortness of breath, use of accessory muscles of respiration, and subsequently transferred back to the stepdown unit to be placed on BiPAP. During stepdown stay, patient did have constipation and ileus.  Surgery was consulted.  Surgery recommended conservative treatment but patient had compliance issues.  Subsequently patient was transferred out of the stepdown unit but developed more abdominal distention and vomiting.  She was then placed on a NG tube with intermittent suction with mild improvement in her symptoms.  Family requested palliative care evaluation for her ongoing symptoms.   Assessment & Plan:   Principal Problem:   Acute on chronic respiratory failure with hypoxia and hypercapnia ([name]) Active Problems:   Hypertension   GERD (gastroesophageal reflux disease)   OSA (obstructive sleep apnea)   Diabetes mellitus type 2, controlled ([name])   COPD exacerbation ([name])   Tobacco abuse   COPD with acute exacerbation ([name])   Chronic respiratory failure with hypoxia ([name])   Acute exacerbation of chronic obstructive pulmonary disease (COPD) ([name])   Malnutrition of moderate degree   ARF (acute renal  failure) ([name])   Hyperkalemia   Abdominal discomfort   Palliative care by specialist   Goals of care, counseling/discussion   Intestinal occlusion ([name])   Abdominal distention, constipation Ileus versus small bowel obstruction Now tolerating full liquid diet, continues with NG tube is clamped.  Reports flatus and small bowel movement yesterday.  She had been seen by surgery who signed off on the patient because of lack of compliance to contrast study, enema and other treatments. --CT abd/pelvis 2/20 w/ findings of dilated prox small bowel loops with mid bowel perienteric edema/inflammation; contrast able to migrate through terminal ileum and right colon --D/C NGT today --continue full liquid diet today --Palliative care following for assistance with goals of care; appreciate assistance --If no improvement may need to rediscuss need for surgical intervention with the patient, which she is been adamantly against during this hospitalization --Encourage further mobilization  Acute on chronic hypoxic, hypercapnic respiratory failure secondary to COPD exacerbation.  Human metapneumovirus Respiratory viral panel was positive for metapneumovirus.    Completed 4-day course of Levaquin. --Continue nebs as needed --Steroid taper complete --Continues on supplemental oxygen, 2LNC (on 2LNC at home); continue to titrate for SPO2 >88%  Hypernatremia Sodium up to 147 on 2/19.  Etiology likely secondary to IV fluids vs dehydration.  --Improved to 140  --Continue full liquid diet --daily BMP  Leukocytosis. Likely secondary to steroids.    Resolved --Steroid taper complete --Continue to monitor CBC daily  History of GERD.  Continue PPI.  Acute kidney injury on chronic kidney disease stage III.     Renal ultrasound showed echogenic kidneys, no hydronephrosis. Etiology likely secondary to dehydration from ileus. --Creatinine improving,2.76-->1.90-->1.60-->1.42-->1.32-->1.30 --Continue monitor  renal function daily  Moderate protein calorie malnutrition --Discontinue NG tube today --advance diet as above  Hypertension Continue with hydralazine as needed for hypertension.  On Flomax.    Anemiaof chronic disease Stable at this time --Closely monitor CBC  Diabetes mellitus type 2 Hemoglobin A1c 6.1 on 11/08/2018.  On Lantus and sliding scale insulin every 6 hourly.  Closely monitor blood glucose levels.  Urinary retention: We will need to closely monitor.  Recently had a Foley catheter  Hypokalemia Hypomagnesemia Potassium 3.5 today, will replete Magnesium 1.4, will replete --continue to monitor BMP with magnesium daily  Enlarged abdominal aortic aneurysm Subtle finding on CT abdomen/pelvis with AAA measuring 4.6 cm in diameter.  Recommend follow-up abdomen/pelvis CTA in 6 months and outpatient vascular surgery referral.  Noncompliance to treatment plan Debility, deconditioning. Intermittent.  Not motivated for physical therapy.  Initial PT evaluation recommended SNF placement; patient adamantly refuses and wishes to return home.  Not participating with physical therapy and not ambulating.  Discussed with patient if she would like to return home she will have to participate to ensure she is safe for discharge home when ready.  I have been continually encouraging the patient for participation each day.     DVT prophylaxis: Heparin Code Status: DNR Family Communication: None Disposition Plan: Continue inpatient level of care, SNF when medically stable   Consultants:   General surgery - signed off as patient declined surgical intervention  Pulmonary/critical care medicine  Palliative care  Procedures:   VQ scan 11/08/2018: Low probability PE  Renal ultrasound 11/09/2018: Bilateral echogenic kidneys, no hydronephrosis/mass  Antimicrobials:   Levaquin 2/5-2/9   Subjective:  Patient seen and examined at bedside, resting comfortably in bed. Denies any  nausea or abdominal pain with full liquid diet.  Reports small bowel movement yesterday.  No other complaints.  Denies headache, no chest pain, no palpitations, no nausea/vomiting/diarrhea, no fever/chills/night sweats.  No other concerns overnight per nursing staff.  Objective: Vitals:   11/23/18 1505 11/23/18 2342 11/24/18 0500 11/24/18 0529  BP: (!) 155/71 (!) 158/73  (!) 151/65  Pulse: 79 86  90  Resp: 14 16  16   Temp: 98.2 F (36.8 C) 97.9 F (36.6 C)  97.7 F (36.5 C)  TempSrc: Oral Oral  Oral  SpO2: 100% 99%    Weight:   75.4 kg   Height:        Intake/Output Summary (Last 24 hours) at 11/24/2018 1052 Last data filed at 11/24/2018 0600 Gross per 24 hour  Intake 240 ml  Output 1000 ml  Net -760 ml   Filed Weights   11/22/18 0558 11/23/18 0500 11/24/18 0500  Weight: 74.3 kg 74.5 kg 75.4 kg    Examination:  General exam: Appears calm and comfortable  Respiratory system: Clear to auscultation. Respiratory effort normal. On 3LNC Cardiovascular system: S1 & S2 heard, RRR. No JVD, murmurs, rubs, gallops or clicks. No pedal edema. Gastrointestinal system: Abdomen is nondistended, soft and nontender. No organomegaly or masses felt.  Faint bowel sounds, slightly more active than yesterday Central nervous system: Alert and oriented. No focal neurological deficits. Extremities: Symmetric 5 x 5 power. Skin: No rashes, lesions or ulcers Psychiatry: Judgement and insight appear normal. Mood & affect appropriate.     Data Reviewed: I have personally reviewed following labs and imaging studies  CBC: Recent Labs  Lab 11/19/18 1138 11/20/18 0604 11/21/18 0606 11/23/18 0510 11/24/18 0528  WBC 13.9* 9.7 10.3 8.3 9.2  HGB 10.6* 9.7* 9.9* 9.0* 9.5*  HCT 35.0* 33.5* 34.3* 30.1* 31.4*  MCV 92.3 92.5 94.5 90.7 91.0  PLT 187 181 154 125* 749*   Basic Metabolic Panel: Recent Labs  Lab 11/18/18 0500 11/19/18 1138 11/20/18 0604 11/21/18 0606 11/23/18 0510 11/24/18 0528  NA  138 144 147* 144 139 140  K 4.3 4.0 3.6 3.1* 2.9* 3.5  CL 101 112* 117* 114* 108 109  CO2 27 26 26 26 25 25   GLUCOSE 94 124* 168* 128* 104* 101*  BUN 90* 58* 37* 26* 20 19  CREATININE 2.76* 1.90* 1.60* 1.42* 1.32* 1.30*  CALCIUM 8.5* 8.3* 8.2* 7.9* 7.4* 7.9*  MG 2.3  --  2.1 1.7 1.5* 1.4*   GFR: Estimated Creatinine Clearance: 38.6 mL/min (A) (by C-G formula based on SCr of 1.3 mg/dL (H)). Liver Function Tests: Recent Labs  Lab 11/20/18 0604  AST 38  ALT 134*  ALKPHOS 39  BILITOT 0.7  PROT 4.9*  ALBUMIN 2.8*   No results for input(s): LIPASE, AMYLASE in the last 168 hours. No results for input(s): AMMONIA in the last 168 hours. Coagulation Profile: No results for input(s): INR, PROTIME in the last 168 hours. Cardiac Enzymes: No results for input(s): CKTOTAL, CKMB, CKMBINDEX, TROPONINI in the last 168 hours. BNP (last 3 results) Recent Labs    04/10/18 1715  PROBNP 37.0   HbA1C: No results for input(s): HGBA1C in the last 72 hours. CBG: Recent Labs  Lab 11/22/18 2351 11/23/18 0555 11/23/18 1130 11/23/18 2340 11/24/18 0515  GLUCAP 113* 102* 104* 116* 88   Lipid Profile: No results for input(s): CHOL, HDL, LDLCALC, TRIG, CHOLHDL, LDLDIRECT in the last 72 hours. Thyroid Function Tests: No results for input(s): TSH, T4TOTAL, FREET4, T3FREE, THYROIDAB in the last 72 hours. Anemia Panel: No results for input(s): VITAMINB12, FOLATE, FERRITIN, TIBC, IRON, RETICCTPCT in the last 72 hours. Sepsis Labs: No results for input(s): PROCALCITON, LATICACIDVEN in the last 168 hours.  No results found for this or any previous visit (from the past 240 hour(s)).       Radiology Studies: No results found.      Scheduled Meds: . bisacodyl  10 mg Rectal Daily  . fluticasone  2 spray Each Nare Daily  . heparin  5,000 Units Subcutaneous Q8H  . insulin aspart  0-9 Units Subcutaneous Q6H  . insulin glargine  10 Units Subcutaneous Daily  . mouth rinse  15 mL Mouth Rinse  BID  . pantoprazole (PROTONIX) IV  40 mg Intravenous Q12H  . potassium chloride  40 mEq Oral BID  . sodium chloride flush  3 mL Intravenous Q12H  . tamsulosin  0.4 mg Oral QPC supper   Continuous Infusions: . magnesium sulfate 1 - 4 g bolus IVPB       LOS: 18 days    Time spent: 29 minutes    Lia Hopping [facility] Pager [phone]  If 7PM-7AM, please contact night-coverage [email or web address] Password [facility] [place] 11/24/2018, 10:52 AM

## 2018-11-24 NOTE — Progress Notes (Signed)
Patient refuses turning and ambulation throughout shift. Will continue to educate patient and encourage mobility.   Barbee Shropshire. Brigitte Pulse, RN

## 2018-11-25 LAB — BASIC METABOLIC PANEL
Anion gap: 5 (ref 5–15)
BUN: 21 mg/dL (ref 8–23)
CHLORIDE: 110 mmol/L (ref 98–111)
CO2: 25 mmol/L (ref 22–32)
Calcium: 8.1 mg/dL — ABNORMAL LOW (ref 8.9–10.3)
Creatinine, Ser: 1.28 mg/dL — ABNORMAL HIGH (ref 0.44–1.00)
GFR calc Af Amer: 49 mL/min — ABNORMAL LOW (ref >60)
GFR calc non Af Amer: 42 mL/min — ABNORMAL LOW (ref >60)
Glucose, Bld: 99 mg/dL (ref 70–99)
Potassium: 4.9 mmol/L (ref 3.5–5.1)
Sodium: 140 mmol/L (ref 135–145)

## 2018-11-25 LAB — GLUCOSE, CAPILLARY
Glucose-Capillary: 140 mg/dL — ABNORMAL HIGH (ref 70–99)
Glucose-Capillary: 77 mg/dL (ref 70–99)
Glucose-Capillary: 83 mg/dL (ref 70–99)
Glucose-Capillary: 87 mg/dL (ref 70–99)
Glucose-Capillary: 91 mg/dL (ref 70–99)

## 2018-11-25 LAB — CBC
HCT: 31.7 % — ABNORMAL LOW (ref 36.0–46.0)
Hemoglobin: 9.4 g/dL — ABNORMAL LOW (ref 12.0–15.0)
MCH: 27.3 pg (ref 26.0–34.0)
MCHC: 29.7 g/dL — ABNORMAL LOW (ref 30.0–36.0)
MCV: 92.2 fL (ref 80.0–100.0)
Platelets: 121 10*3/uL — ABNORMAL LOW (ref 150–400)
RBC: 3.44 MIL/uL — ABNORMAL LOW (ref 3.87–5.11)
RDW: 14.8 % (ref 11.5–15.5)
WBC: 7.1 10*3/uL (ref 4.0–10.5)
nRBC: 0 % (ref 0.0–0.2)

## 2018-11-25 LAB — MAGNESIUM: Magnesium: 1.9 mg/dL (ref 1.7–2.4)

## 2018-11-25 MED ORDER — PANTOPRAZOLE SODIUM 40 MG PO TBEC
40.0000 mg | DELAYED_RELEASE_TABLET | Freq: Two times a day (BID) | ORAL | Status: DC
  Administered 2018-11-25 – 2018-11-30 (×8): 40 mg via ORAL
  Filled 2018-11-25 (×9): qty 1

## 2018-11-25 MED ORDER — HYDRALAZINE HCL 20 MG/ML IJ SOLN
5.0000 mg | Freq: Four times a day (QID) | INTRAMUSCULAR | Status: DC | PRN
  Administered 2018-11-27: 5 mg via INTRAVENOUS
  Filled 2018-11-25 (×2): qty 1

## 2018-11-25 MED ORDER — ENSURE ENLIVE PO LIQD
237.0000 mL | Freq: Two times a day (BID) | ORAL | Status: DC
  Administered 2018-11-25 – 2018-11-26 (×2): 237 mL via ORAL

## 2018-11-25 MED ORDER — ADULT MULTIVITAMIN LIQUID CH
15.0000 mL | Freq: Every day | ORAL | Status: DC
  Filled 2018-11-25 (×6): qty 15

## 2018-11-25 NOTE — Care Management Important Message (Signed)
Important Message  Patient Details  Name: Victoria Bird MRN: [id number] Date of Birth: 08-19-1946   Medicare Important Message Given:  Yes    Kerin Salen 11/25/2018, 12:19 [place] Message  Patient Details  Name: Victoria Bird MRN: [id number] Date of Birth: 1946-01-30   Medicare Important Message Given:  Yes    Kerin Salen 11/25/2018, 12:19 PM

## 2018-11-25 NOTE — Progress Notes (Signed)
PROGRESS NOTE    Victoria Bird  XBM:[id number] DOB: 01/14/1946 DOA: 11/06/2018 PCP: Hoyt Koch, MD    Brief Narrative:   Patient is a 73 year old female history of chronic respiratory failure on 2 L nasal cannula chronically secondary to severe COPD with very severe obstruction on spirometry of July 2019, chronic kidney disease stage III- 4, former tobacco user quit in 2019, hypertension presented to the ED with worsening shortness of breath and was admitted for an acute COPD exacerbation. Patient on admission was placed in the stepdown unit and placed on BiPAP, IV steroids, scheduled nebulizer treatments. Patient subsequently improved was taken off BiPAP and transferred to the telemetry floor. Patient still with ongoing poor air movement. On 11/29/2018 patient with worsening shortness of breath, use of accessory muscles of respiration, and subsequently transferred back to the stepdown unit to be placed on BiPAP. During stepdown stay, patient did have constipation and ileus.  Surgery was consulted.  Surgery recommended conservative treatment but patient had compliance issues.  Subsequently patient was transferred out of the stepdown unit but developed more abdominal distention and vomiting.  She was then placed on a NG tube with intermittent suction with mild improvement in her symptoms.  Family requested palliative care evaluation for her ongoing symptoms.   Assessment & Plan:   Principal Problem:   Acute on chronic respiratory failure with hypoxia and hypercapnia ([name]) Active Problems:   Hypertension   GERD (gastroesophageal reflux disease)   OSA (obstructive sleep apnea)   Diabetes mellitus type 2, controlled ([name])   COPD exacerbation ([name])   Tobacco abuse   COPD with acute exacerbation ([name])   Chronic respiratory failure with hypoxia ([name])   Acute exacerbation of chronic obstructive pulmonary disease (COPD) ([name])   Malnutrition of moderate degree   ARF (acute renal  failure) ([name])   Hyperkalemia   Abdominal discomfort   Palliative care by specialist   Goals of care, counseling/discussion   Intestinal occlusion ([name])   Abdominal distention, constipation Ileus versus small bowel obstruction Now tolerating full liquid diet, continues with NG tube is clamped.  Reports flatus and small bowel movement yesterday.  She had been seen by surgery who signed off on the patient because of lack of compliance to contrast study, enema and other treatments. --CT abd/pelvis 2/20 w/ findings of dilated prox small bowel loops with mid bowel perienteric edema/inflammation; contrast able to migrate through terminal ileum and right colon --D/C NGT 2/23 --Tolerating full liquid diet past 48 hours, advance to soft diet today --Palliative care following for assistance with goals of care; appreciate assistance --Encourage further mobilization; once again refused PT today  Acute on chronic hypoxic, hypercapnic respiratory failure secondary to COPD exacerbation.  Human metapneumovirus Respiratory viral panel was positive for metapneumovirus.    Completed 4-day course of Levaquin and steroid taper. --Continue nebs as needed --Continues on supplemental oxygen, 2LNC (on 2LNC at home); continue to titrate for SPO2 >88%  Hypernatremia Sodium up to 147 on 2/19.  Etiology likely secondary to IV fluids vs dehydration.  --Improved to 140  --Diet advanced today to soft --daily BMP  Leukocytosis: Resolved Likely secondary to steroids.  --Steroid taper complete --Continue to monitor CBC q3d  History of GERD.  Continue PPI.  Acute kidney injury on chronic kidney disease stage III.     Renal ultrasound showed echogenic kidneys, no hydronephrosis. Etiology likely secondary to dehydration from ileus. --Creatinine improving,2.76-->1.90-->1.60-->1.42-->1.32-->1.30-->1.28 --Continue monitor renal function daily  Moderate protein calorie malnutrition --Discontinue NG tube  2/23 --  advance diet as above --Patient following, appreciate assistance  Hypertension Continue with hydralazine as needed for hypertension.  On Flomax.    Anemiaof chronic disease Stable at this time --Closely monitor CBC  Diabetes mellitus type 2 Hemoglobin A1c 6.1 on 11/08/2018.  On Lantus and sliding scale insulin q6h.  Closely monitor blood glucose levels.  Urinary retention: We will need to closely monitor.  Recently had a Foley catheter  Hypokalemia Hypomagnesemia Potassium 4.9 today --continue to monitor BMP with magnesium daily  Enlarged abdominal aortic aneurysm Subtle finding on CT abdomen/pelvis with AAA measuring 4.6 cm in diameter.  Recommend follow-up abdomen/pelvis CTA in 6 months and outpatient vascular surgery referral.  Noncompliance to treatment plan Debility, deconditioning. Intermittent.  Not motivated for physical therapy.  Initial PT evaluation recommended SNF placement; patient adamantly refuses and wishes to return home.  Not participating with physical therapy and not ambulating.  Discussed with patient if she would like to return home she will have to participate to ensure she is safe for discharge home when ready.  I have been continually encouraging the patient for participation each day; but once again refused PT services today.    DVT prophylaxis: Heparin Code Status: DNR Family Communication: None Disposition Plan: Continue inpatient level of care, SNF when medically stable   Consultants:   General surgery - signed off as patient declined surgical intervention  Pulmonary/critical care medicine  Palliative care  Procedures:   VQ scan 11/08/2018: Low probability PE  Renal ultrasound 11/09/2018: Bilateral echogenic kidneys, no hydronephrosis/mass  Antimicrobials:   Levaquin 2/5-2/9   Subjective:  Patient seen and examined at bedside, resting comfortably in bed. Denies any nausea or abdominal pain with full liquid diet over the past  48 hours.  Reports small bowel movement this morning.  Refused PT again this morning.  No other complaints.  Denies headache, no chest pain, no palpitations, no nausea/vomiting/diarrhea, no fever/chills/night sweats.  No other concerns overnight per nursing staff.  Objective: Vitals:   11/24/18 1305 11/24/18 2108 11/25/18 0446 11/25/18 0447  BP: (!) 163/73 (!) 165/72  (!) 148/67  Pulse: 92 91  87  Resp: 14 18  18   Temp: 98.2 F (36.8 C) 97.9 F (36.6 C)  97.8 F (36.6 C)  TempSrc: Oral Oral  Oral  SpO2: 98% 99%  98%  Weight:   74.8 kg   Height:        Intake/Output Summary (Last 24 hours) at 11/25/2018 1143 Last data filed at 11/24/2018 2000 Gross per 24 hour  Intake 650 ml  Output 550 ml  Net 100 ml   Filed Weights   11/23/18 0500 11/24/18 0500 11/25/18 0446  Weight: 74.5 kg 75.4 kg 74.8 kg    Examination:  General exam: Appears calm and comfortable  Respiratory system: Clear to auscultation. Respiratory effort normal. On 2LNC Cardiovascular system: S1 & S2 heard, RRR. No JVD, murmurs, rubs, gallops or clicks. No pedal edema. Gastrointestinal system: Abdomen is nondistended, soft and nontender. No organomegaly or masses felt.  Faint bowel sounds Central nervous system: Alert and oriented. No focal neurological deficits. Extremities: Symmetric 5 x 5 power. Skin: No rashes, lesions or ulcers Psychiatry: Judgement and insight appear normal. Mood & affect appropriate.     Data Reviewed: I have personally reviewed following labs and imaging studies  CBC: Recent Labs  Lab 11/20/18 0604 11/21/18 0606 11/23/18 0510 11/24/18 0528 11/25/18 0622  WBC 9.7 10.3 8.3 9.2 7.1  HGB 9.7* 9.9* 9.0* 9.5* 9.4*  HCT 33.5* 34.3*  30.1* 31.4* 31.7*  MCV 92.5 94.5 90.7 91.0 92.2  PLT 181 154 125* 130* 102*   Basic Metabolic Panel: Recent Labs  Lab 11/20/18 0604 11/21/18 0606 11/23/18 0510 11/24/18 0528 11/25/18 0622  NA 147* 144 139 140 140  K 3.6 3.1* 2.9* 3.5 4.9  CL 117*  114* 108 109 110  CO2 26 26 25 25 25   GLUCOSE 168* 128* 104* 101* 99  BUN 37* 26* 20 19 21   CREATININE 1.60* 1.42* 1.32* 1.30* 1.28*  CALCIUM 8.2* 7.9* 7.4* 7.9* 8.1*  MG 2.1 1.7 1.5* 1.4* 1.9   GFR: Estimated Creatinine Clearance: 39.1 mL/min (A) (by C-G formula based on SCr of 1.28 mg/dL (H)). Liver Function Tests: Recent Labs  Lab 11/20/18 0604  AST 38  ALT 134*  ALKPHOS 39  BILITOT 0.7  PROT 4.9*  ALBUMIN 2.8*   No results for input(s): LIPASE, AMYLASE in the last 168 hours. No results for input(s): AMMONIA in the last 168 hours. Coagulation Profile: No results for input(s): INR, PROTIME in the last 168 hours. Cardiac Enzymes: No results for input(s): CKTOTAL, CKMB, CKMBINDEX, TROPONINI in the last 168 hours. BNP (last 3 results) Recent Labs    04/10/18 1715  PROBNP 37.0   HbA1C: No results for input(s): HGBA1C in the last 72 hours. CBG: Recent Labs  Lab 11/23/18 2340 11/24/18 0515 11/24/18 1159 11/24/18 1759 11/25/18 0553  GLUCAP 116* 88 122* 138* 91   Lipid Profile: No results for input(s): CHOL, HDL, LDLCALC, TRIG, CHOLHDL, LDLDIRECT in the last 72 hours. Thyroid Function Tests: No results for input(s): TSH, T4TOTAL, FREET4, T3FREE, THYROIDAB in the last 72 hours. Anemia Panel: No results for input(s): VITAMINB12, FOLATE, FERRITIN, TIBC, IRON, RETICCTPCT in the last 72 hours. Sepsis Labs: No results for input(s): PROCALCITON, LATICACIDVEN in the last 168 hours.  No results found for this or any previous visit (from the past 240 hour(s)).       Radiology Studies: No results found.      Scheduled Meds: . bisacodyl  10 mg Rectal Daily  . feeding supplement (ENSURE ENLIVE)  237 mL Oral BID BM  . fluticasone  2 spray Each Nare Daily  . heparin  5,000 Units Subcutaneous Q8H  . insulin aspart  0-9 Units Subcutaneous Q6H  . insulin glargine  10 Units Subcutaneous Daily  . mouth rinse  15 mL Mouth Rinse BID  . multivitamin  15 mL Oral Daily  .  pantoprazole  40 mg Oral BID  . potassium chloride  40 mEq Oral BID  . sodium chloride flush  3 mL Intravenous Q12H  . tamsulosin  0.4 mg Oral QPC supper   Continuous Infusions:    LOS: 19 days    Time spent: 28 minutes    Lia Hopping [facility] Pager [phone]  If 7PM-7AM, please contact night-coverage [email or web address] Password [facility] 11/25/2018, 11:43 AM

## 2018-11-25 NOTE — Progress Notes (Signed)
PT Cancellation Note  Patient Details Name: SHIREL MALLIS MRN: [id number] DOB: 16-Jul-1946   Cancelled Treatment:    Reason Eval/Treat Not Completed: Patient declined, no reason specified(pt refused to attempt getting out of bed and also refused bed level exercises for strengthening. Encouragement to participate was provided, but pt adamantly refused. )  [name] PT 11/25/2018  Acute Rehabilitation Services Pager [phone] Office [phone]

## 2018-11-25 NOTE — Progress Notes (Signed)
Nutrition Follow-up  DOCUMENTATION CODES:   Non-severe (moderate) malnutrition in context of chronic illness  INTERVENTION:  - Will order Ensure Enlive BID, each supplement provides 350 kcal and 20 grams of protein. - Will order daily multivitamin with minerals. - Continue to encourage PO intakes.    NUTRITION DIAGNOSIS:   Moderate Malnutrition related to chronic illness(COPD) as evidenced by percent weight loss, energy intake < 75% for > or equal to 1 month, moderate muscle depletion, severe muscle depletion. -ongoing  GOAL:   Patient will meet greater than or equal to 90% of their needs -unmet  MONITOR:   PO intake, Supplement acceptance, Diet advancement, Weight trends, Labs  ASSESSMENT:   Patient with PMH significant for COPD, CKD III, and HTN. Recently admitted January 23-January 27 where she was treated for COPD exacerbation in the setting of PNA. Presents this admission with acute exacerbation of COPD.   Weight was trending back up and has now been stable for the past 4 days; will continue to monitor closely. NGT removed on 2/23. Diet was advanced from NPO to CLD on 2/21 at 9:00 AM, from CLD to [place] on 2/22 at 9:00 AM, and then was advanced from FLD to Soft today at 9:50 AM.   She consumed 50% of dinner on 2/22 (245 kcal, 7 grams of protein) and 100% of dinner on 2/23 (270 kcal, 9 grams of protein). No intakes since diet advancement this AM (no solid foods yet).  Per Dr. Aneta Mins note yesterday AM: Palliative Care following for [place], possible need to re-visit discussion about surgery if patient does not improve, leukocytosis--resolved, AKI, non-compliance with treatment plan (specifically with working with PT/mobilizing). Note states PT had previously recommended SNF but patient was adamant about going back home.   Medications reviewed; sliding scale novolog, 10 units lantus/day, 2 g IV mg sulfate x1 run 2/23, 40 mg oral protonix BID, 40 mEq K-Dur BID.  Labs reviewed; CBG:  91 mg/dl today, Ca: 8.1 mg/dl, creatinine: 1.28 mg/dl, GFR: 42 mmol/l.     Diet Order:   Diet Order            DIET SOFT Room service appropriate? Yes; Fluid consistency: Thin  Diet effective now              EDUCATION NEEDS:   Education needs have been addressed  Skin:  Skin Assessment: Skin Integrity Issues: Skin Integrity Issues:: Other (Comment) Other: MASD- bilateral perineum  Last BM:  2/22  Height:   Ht Readings from Last 1 Encounters:  11/07/18 5\' 3"  (1.6 m)    Weight:   Wt Readings from Last 1 Encounters:  11/25/18 74.8 kg    Ideal Body Weight:  52.3 kg  BMI:  Body mass index is 29.21 kg/m.  Estimated Nutritional Needs:   Kcal:  1650-1850 kcal  Protein:  80-95 grams  Fluid:  >/= 1.6 L/day     Jarome Matin, MS, RD, LDN, [facility] Inpatient Clinical Dietitian Pager # [phone] After hours/weekend pager # [phone]

## 2018-11-26 LAB — BASIC METABOLIC PANEL
Anion gap: 5 (ref 5–15)
BUN: 21 mg/dL (ref 8–23)
CO2: 26 mmol/L (ref 22–32)
Calcium: 8 mg/dL — ABNORMAL LOW (ref 8.9–10.3)
Chloride: 108 mmol/L (ref 98–111)
Creatinine, Ser: 1.19 mg/dL — ABNORMAL HIGH (ref 0.44–1.00)
GFR calc Af Amer: 53 mL/min — ABNORMAL LOW (ref >60)
GFR calc non Af Amer: 46 mL/min — ABNORMAL LOW (ref >60)
GLUCOSE: 97 mg/dL (ref 70–99)
Potassium: 4.5 mmol/L (ref 3.5–5.1)
Sodium: 139 mmol/L (ref 135–145)

## 2018-11-26 LAB — CBC
HCT: 31.9 % — ABNORMAL LOW (ref 36.0–46.0)
HEMOGLOBIN: 9.5 g/dL — AB (ref 12.0–15.0)
MCH: 27.6 pg (ref 26.0–34.0)
MCHC: 29.8 g/dL — ABNORMAL LOW (ref 30.0–36.0)
MCV: 92.7 fL (ref 80.0–100.0)
Platelets: 119 10*3/uL — ABNORMAL LOW (ref 150–400)
RBC: 3.44 MIL/uL — ABNORMAL LOW (ref 3.87–5.11)
RDW: 15.1 % (ref 11.5–15.5)
WBC: 7.9 10*3/uL (ref 4.0–10.5)
nRBC: 0 % (ref 0.0–0.2)

## 2018-11-26 LAB — GLUCOSE, CAPILLARY
Glucose-Capillary: 78 mg/dL (ref 70–99)
Glucose-Capillary: 103 mg/dL — ABNORMAL HIGH (ref 70–99)
Glucose-Capillary: 75 mg/dL (ref 70–99)

## 2018-11-26 LAB — MAGNESIUM: Magnesium: 1.7 mg/dL (ref 1.7–2.4)

## 2018-11-26 MED ORDER — AMLODIPINE BESYLATE 5 MG PO TABS
5.0000 mg | ORAL_TABLET | Freq: Every day | ORAL | Status: DC
  Administered 2018-11-27 – 2018-11-30 (×4): 5 mg via ORAL
  Filled 2018-11-26 (×4): qty 1

## 2018-11-26 NOTE — Progress Notes (Signed)
Patient adamantly refuses to get out off bed when assistance offered by nursing staff. Pt continue to use bedpan or be incontinent in bed and refuses to get up so staff can change linens. Bed changed completed with patient in bed. Will continue to monitor patient.

## 2018-11-26 NOTE — Progress Notes (Signed)
OT Cancellation Note  Patient Details Name: Victoria Bird MRN: [id number] DOB: 10/19/1945   Cancelled Treatment:    Reason Eval/Treat Not Completed: Other (comment). Noted pt just refused PT. Will try another day.  [name] 11/26/2018, 3:45 PM  Lesle Chris, OTR/L Acute Rehabilitation Services [phone] [facility] pager [phone] office 11/26/2018

## 2018-11-26 NOTE — Progress Notes (Signed)
PROGRESS NOTE    Victoria Bird  VFI:[id number] DOB: 02/28/1946 DOA: 11/06/2018 PCP: Hoyt Koch, MD    Brief Narrative:   Patient is a 73 year old female history of chronic respiratory failure on 2 L nasal cannula chronically secondary to severe COPD with very severe obstruction on spirometry of July 2019, chronic kidney disease stage III- 4, former tobacco user quit in 2019, hypertension presented to the ED with worsening shortness of breath and was admitted for an acute COPD exacerbation. Patient on admission was placed in the stepdown unit and placed on BiPAP, IV steroids, scheduled nebulizer treatments. Patient subsequently improved was taken off BiPAP and transferred to the telemetry floor. Patient still with ongoing poor air movement. On 11/29/2018 patient with worsening shortness of breath, use of accessory muscles of respiration, and subsequently transferred back to the stepdown unit to be placed on BiPAP. During stepdown stay, patient did have constipation and ileus.  Surgery was consulted.  Surgery recommended conservative treatment but patient had compliance issues.  Subsequently patient was transferred out of the stepdown unit but developed more abdominal distention and vomiting.  She was then placed on a NG tube with intermittent suction with mild improvement in her symptoms.  Family requested palliative care evaluation for her ongoing symptoms.   Assessment & Plan:   Principal Problem:   Acute on chronic respiratory failure with hypoxia and hypercapnia ([name]) Active Problems:   Hypertension   GERD (gastroesophageal reflux disease)   OSA (obstructive sleep apnea)   Diabetes mellitus type 2, controlled ([name])   COPD exacerbation ([name])   Tobacco abuse   COPD with acute exacerbation ([name])   Chronic respiratory failure with hypoxia ([name])   Acute exacerbation of chronic obstructive pulmonary disease (COPD) ([name])   Malnutrition of moderate degree   ARF (acute renal  failure) ([name])   Hyperkalemia   Abdominal discomfort   Palliative care by specialist   Goals of care, counseling/discussion   Intestinal occlusion ([name])   Abdominal distention, constipation Ileus versus small bowel obstruction Hospital course complicated by patient development of a small bowel obstruction/ileus.  Initially evaluated by general surgery who signed off because patient's lack of compliance to medications and treatments.  Initially required NG tube with bowel decompression.  Diet has slowly been transitioned and now tolerating soft diet. Reports flatus and two bowel movements yesterday.   --CT A/P 2/20 w/ findings of dilated prox small bowel loops with mid bowel perienteric edema/inflammation; contrast able to migrate through terminal ileum and right colon --D/C'd NGT 2/23 --Tolerating soft diet, will advance to regular diet today --Continues to be noncompliant with PT/nursing; encouraged her to do more today --Palliative care following for assistance with goals of care; appreciate assistance --Encourage further mobilization  Acute on chronic hypoxic, hypercapnic respiratory failure secondary to COPD exacerbation.  Human metapneumovirus Respiratory viral panel was positive for metapneumovirus.    Completed 4-day course of Levaquin and steroid taper. --Continue nebs as needed --Continues on supplemental oxygen, 2LNC (on 2LNC at home); continue to titrate for SPO2 >88%  Hypernatremia Sodium up to 147 on 2/19.  Etiology likely secondary to IV fluids vs dehydration.  --Improved to 139  --Diet advanced today to regular today --daily BMP  Leukocytosis: Resolved Likely secondary to steroids.  --Steroid taper complete --Continue to monitor CBC q3d  History of GERD.  Continue PPI.  Acute kidney injury on chronic kidney disease stage III.     Renal ultrasound showed echogenic kidneys, no hydronephrosis. Etiology likely secondary to dehydration from  ileus. --Creatinine  improving, 2.76-->1.90-->1.60-->1.42-->1.32-->1.30-->1.28-->1.19 --Continue monitor renal function daily  Moderate protein calorie malnutrition --Discontinued NG tube 2/23 --advance diet as above --Nutrition following, appreciate assistance  Hypertension   --Started amlodipine 5 mg p.o. daily; patient refusing intermittently --Hydralazine PRN    Anemiaof chronic disease Stable at this time --Closely monitor CBC  Diabetes mellitus type 2 Hemoglobin A1c 6.1 on 11/08/2018.  On Lantus and sliding scale insulin q6h.  Closely monitor blood glucose levels.  Urinary retention: We will need to closely monitor.  Recently had a Foley catheter  Hypokalemia Hypomagnesemia Potassium 4.9 today --continue to monitor BMP with magnesium daily  Enlarged abdominal aortic aneurysm Subtle finding on CT abdomen/pelvis with AAA measuring 4.6 cm in diameter.  Recommend follow-up abdomen/pelvis CTA in 6 months and outpatient vascular surgery referral.  Noncompliance to treatment plan Debility, deconditioning. Intermittent.  Not motivated for physical therapy.  Initial PT evaluation recommended SNF placement; patient adamantly refuses and wishes to return home.  Not participating with physical therapy and not ambulating.  Discussed with patient if she would like to return home she will have to participate to ensure she is safe for discharge home when ready.  I have been continually encouraging the patient for participation each day.    DVT prophylaxis: Heparin Code Status: DNR Family Communication: None Disposition Plan: Continue inpatient level of care, if able to tolerate regular diet, will be ready for discharge to SNF next 1-2 days.   Consultants:   General surgery - signed off as patient declined surgical intervention  Pulmonary/critical care medicine  Palliative care  Procedures:   VQ scan 11/08/2018: Low probability PE  Renal ultrasound 11/09/2018: Bilateral echogenic kidneys, no  hydronephrosis/mass  Antimicrobials:   Levaquin 2/5-2/9   Subjective:  Patient seen and examined at bedside, resting comfortably in bed.  Continues with refusal of some of her medications and physical therapy.  Does not even want to get out of bed to the bedside commode.  Reports 2 bowel movements yesterday, tolerating diet.  Denies any nausea or abdominal pain with soft diet. No other complaints.  Denies headache, no chest pain, no palpitations, no nausea/vomiting/diarrhea, no fever/chills/night sweats.  No other concerns overnight per nursing staff.  Objective: Vitals:   11/25/18 1500 11/25/18 2330 11/26/18 0611 11/26/18 0627  BP: 138/71 (!) 159/71 (!) 160/81   Pulse: 79 88 93   Resp: 18 18 16    Temp: 98.3 F (36.8 C) 98.2 F (36.8 C) 98.2 F (36.8 C)   TempSrc: Oral Oral Oral   SpO2: 99% 100% 99%   Weight:    73.9 kg  Height:        Intake/Output Summary (Last 24 hours) at 11/26/2018 1123 Last data filed at 11/26/2018 1000 Gross per 24 hour  Intake 478 ml  Output 800 ml  Net -322 ml   Filed Weights   11/24/18 0500 11/25/18 0446 11/26/18 0627  Weight: 75.4 kg 74.8 kg 73.9 kg    Examination:  General exam: Appears calm and comfortable  Respiratory system: Clear to auscultation. Respiratory effort normal. On 2LNC Cardiovascular system: S1 & S2 heard, RRR. No JVD, murmurs, rubs, gallops or clicks. No pedal edema. Gastrointestinal system: Abdomen is nondistended, soft and nontender. No organomegaly or masses felt.  Faint bowel sounds Central nervous system: Alert and oriented. No focal neurological deficits. Extremities: Symmetric 5 x 5 power. Skin: No rashes, lesions or ulcers Psychiatry: Judgement and insight appear normal. Mood & affect appropriate.     Data Reviewed: I have  personally reviewed following labs and imaging studies  CBC: Recent Labs  Lab 11/21/18 0606 11/23/18 0510 11/24/18 0528 11/25/18 0622 11/26/18 0543  WBC 10.3 8.3 9.2 7.1 7.9  HGB 9.9*  9.0* 9.5* 9.4* 9.5*  HCT 34.3* 30.1* 31.4* 31.7* 31.9*  MCV 94.5 90.7 91.0 92.2 92.7  PLT 154 125* 130* 121* 419*   Basic Metabolic Panel: Recent Labs  Lab 11/21/18 0606 11/23/18 0510 11/24/18 0528 11/25/18 0622 11/26/18 0543  NA 144 139 140 140 139  K 3.1* 2.9* 3.5 4.9 4.5  CL 114* 108 109 110 108  CO2 26 25 25 25 26   GLUCOSE 128* 104* 101* 99 97  BUN 26* 20 19 21 21   CREATININE 1.42* 1.32* 1.30* 1.28* 1.19*  CALCIUM 7.9* 7.4* 7.9* 8.1* 8.0*  MG 1.7 1.5* 1.4* 1.9 1.7   GFR: Estimated Creatinine Clearance: 41.8 mL/min (A) (by C-G formula based on SCr of 1.19 mg/dL (H)). Liver Function Tests: Recent Labs  Lab 11/20/18 0604  AST 38  ALT 134*  ALKPHOS 39  BILITOT 0.7  PROT 4.9*  ALBUMIN 2.8*   No results for input(s): LIPASE, AMYLASE in the last 168 hours. No results for input(s): AMMONIA in the last 168 hours. Coagulation Profile: No results for input(s): INR, PROTIME in the last 168 hours. Cardiac Enzymes: No results for input(s): CKTOTAL, CKMB, CKMBINDEX, TROPONINI in the last 168 hours. BNP (last 3 results) Recent Labs    04/10/18 1715  PROBNP 37.0   HbA1C: No results for input(s): HGBA1C in the last 72 hours. CBG: Recent Labs  Lab 11/25/18 0553 11/25/18 1207 11/25/18 1705 11/25/18 2335 11/26/18 0613  GLUCAP 91 140* 83 77 78   Lipid Profile: No results for input(s): CHOL, HDL, LDLCALC, TRIG, CHOLHDL, LDLDIRECT in the last 72 hours. Thyroid Function Tests: No results for input(s): TSH, T4TOTAL, FREET4, T3FREE, THYROIDAB in the last 72 hours. Anemia Panel: No results for input(s): VITAMINB12, FOLATE, FERRITIN, TIBC, IRON, RETICCTPCT in the last 72 hours. Sepsis Labs: No results for input(s): PROCALCITON, LATICACIDVEN in the last 168 hours.  No results found for this or any previous visit (from the past 240 hour(s)).       Radiology Studies: No results found.      Scheduled Meds: . amLODipine  5 mg Oral Daily  . bisacodyl  10 mg Rectal  Daily  . feeding supplement (ENSURE ENLIVE)  237 mL Oral BID BM  . fluticasone  2 spray Each Nare Daily  . heparin  5,000 Units Subcutaneous Q8H  . insulin aspart  0-9 Units Subcutaneous Q6H  . insulin glargine  10 Units Subcutaneous Daily  . mouth rinse  15 mL Mouth Rinse BID  . multivitamin  15 mL Oral Daily  . pantoprazole  40 mg Oral BID  . potassium chloride  40 mEq Oral BID  . sodium chloride flush  3 mL Intravenous Q12H  . tamsulosin  0.4 mg Oral QPC supper   Continuous Infusions:    LOS: 20 days    Time spent: 29 minutes    Lia Hopping [facility] Pager [phone]  If 7PM-7AM, please contact night-coverage [email or web address] Password [facility] 11/26/2018, 11:23 AM

## 2018-11-26 NOTE — Progress Notes (Signed)
PT Cancellation Note  Patient Details Name: Victoria Bird MRN: [id number] DOB: 01/04/1946   Cancelled Treatment:    Reason Eval/Treat Not Completed: Patient declined, no reason specified.  Pt refused therapy with no specific reason for declining.  Will follow up at another time.   Ramond Dial 11/26/2018, 3:25 PM   Mee Hives, PT MS Acute Rehab Dept. Number: [place] and [place]

## 2018-11-27 LAB — BASIC METABOLIC PANEL
Anion gap: 6 (ref 5–15)
BUN: 21 mg/dL (ref 8–23)
CO2: 25 mmol/L (ref 22–32)
Calcium: 8.4 mg/dL — ABNORMAL LOW (ref 8.9–10.3)
Chloride: 109 mmol/L (ref 98–111)
Creatinine, Ser: 1.24 mg/dL — ABNORMAL HIGH (ref 0.44–1.00)
GFR calc Af Amer: 51 mL/min — ABNORMAL LOW (ref >60)
GFR, EST NON AFRICAN AMERICAN: 44 mL/min — AB (ref >60)
Glucose, Bld: 86 mg/dL (ref 70–99)
Potassium: 4.7 mmol/L (ref 3.5–5.1)
Sodium: 140 mmol/L (ref 135–145)

## 2018-11-27 LAB — CBC
HCT: 30.3 % — ABNORMAL LOW (ref 36.0–46.0)
Hemoglobin: 9.1 g/dL — ABNORMAL LOW (ref 12.0–15.0)
MCH: 27.5 pg (ref 26.0–34.0)
MCHC: 30 g/dL (ref 30.0–36.0)
MCV: 91.5 fL (ref 80.0–100.0)
PLATELETS: 119 10*3/uL — AB (ref 150–400)
RBC: 3.31 MIL/uL — ABNORMAL LOW (ref 3.87–5.11)
RDW: 15.3 % (ref 11.5–15.5)
WBC: 7.2 10*3/uL (ref 4.0–10.5)
nRBC: 0 % (ref 0.0–0.2)

## 2018-11-27 LAB — MAGNESIUM: MAGNESIUM: 1.8 mg/dL (ref 1.7–2.4)

## 2018-11-27 LAB — GLUCOSE, CAPILLARY
Glucose-Capillary: 100 mg/dL — ABNORMAL HIGH (ref 70–99)
Glucose-Capillary: 100 mg/dL — ABNORMAL HIGH (ref 70–99)
Glucose-Capillary: 106 mg/dL — ABNORMAL HIGH (ref 70–99)
Glucose-Capillary: 127 mg/dL — ABNORMAL HIGH (ref 70–99)
Glucose-Capillary: 83 mg/dL (ref 70–99)

## 2018-11-27 MED ORDER — TAMSULOSIN HCL 0.4 MG PO CAPS: 0.4000 mg | ORAL_CAPSULE | Freq: Every day | ORAL | 0 refills | Status: AC

## 2018-11-27 NOTE — Progress Notes (Signed)
PT Cancellation Note  Patient Details Name: Victoria Bird MRN: [id number] DOB: 01-23-1946   Cancelled Treatment:    Reason Eval/Treat Not Completed: Patient declined, no reason specified Pt declines any mobility today and reports she is discharging home.   Sheina Mcleish,[name] 11/27/2018, 11:17 AM [name], PT, DPT Acute Rehabilitation Services Office: [phone] Pager: [phone]

## 2018-11-27 NOTE — Care Management Note (Signed)
Case Management Note  Patient Details  Name: Victoria Bird MRN: [id number] Date of Birth: 04-Apr-1946  Subjective/Objective:Several conversations with patient about recc-SNF(she adamantly declines,refuses HHC, & [facility] services) states she has support @ home,has dme,02. Has orders for HHC-patient adamantly declines.States she will contact her dtr,then son if they are available to pick her up. Will have PTAR available if needed. Nsg will call PTAR when ready if needed.                     Action/Plan:dc home,refuses any SNF/HHC/THN.   Expected Discharge Date:  11/27/18               Expected Discharge Plan:  Home/Self Care  In-House Referral:     Discharge planning Services  CM Consult  Post Acute Care Choice:  Durable Medical Equipment(rw,3n1,AHC home 02-has travel tank) Choice offered to:  Patient  DME Arranged:    DME Agency:     HH Arranged:  Patient Refused HH, Refused SNF(Patient Refused [place]) [place] Agency:     Status of Service:  Completed, signed off  If discussed at [employer], dates discussed:    Additional Comments:  [name], RN 11/27/2018, 11:19 AM

## 2018-11-27 NOTE — Discharge Summary (Addendum)
Physician Discharge Summary  Victoria Bird GBT:[id number] DOB: 1945/10/04 DOA: 11/06/2018  PCP: Hoyt Koch, MD  Admit date: 11/06/2018 Discharge date: 11/30/2018  Admitted From: Home Disposition:  SNF  Recommendations for Outpatient Follow-up:  1. Follow up with PCP in 1-2 weeks 2. Follow up with General Surgery in 1-2 weeks  Discharge Condition:Improved CODE STATUS:Full Diet recommendation: Diabetic   Brief/Interim Summary: 73 year old female history of chronic respiratory failure on 2 L nasal cannula chronically secondary to severe COPD with very severe obstruction on spirometry of July 2019, chronic kidney disease stage III- 4, former tobacco user quit in 2019, hypertension presented to the ED with worsening shortness of breath and was admitted for an acute COPD exacerbation. Patient on admission was placed in the stepdown unit and placed on BiPAP, IV steroids, scheduled nebulizer treatments. Patient subsequently improved was taken off BiPAP and transferred to the telemetry floor. Patient still with ongoing poor air movement. On 11/29/2018 patient with worsening shortness of breath, use of accessory muscles of respiration, and subsequently transferred back to the stepdown unit to be placed on BiPAP. During stepdown stay, patient did have constipation and ileus. Surgery was consulted. Surgery recommended conservative treatment but patient had compliance issues.Subsequently patient was transferred out of the stepdown unit but developed more abdominal distention and vomiting. She was then placed on a NG tube with intermittent suction with mild improvement in her symptoms. Family requested palliative care evaluation for her ongoing symptoms.   Discharge Diagnoses:  Principal Problem:   Acute on chronic respiratory failure with hypoxia and hypercapnia ([name]) Active Problems:   Hypertension   GERD (gastroesophageal reflux disease)   OSA (obstructive sleep apnea)    Diabetes mellitus type 2, controlled ([name])   COPD exacerbation ([name])   Tobacco abuse   COPD with acute exacerbation ([name])   Chronic respiratory failure with hypoxia ([name])   Acute exacerbation of chronic obstructive pulmonary disease (COPD) ([name])   Malnutrition of moderate degree   ARF (acute renal failure) ([name])   Hyperkalemia   Abdominal discomfort   Palliative care by specialist   Goals of care, counseling/discussion   Intestinal occlusion ([name])  Abdominal distention, constipation Ileus versus small bowel obstruction Hospital course complicated by patient development of a small bowel obstruction/ileus. Initially evaluated by general surgery who signed off because patient's lack of compliance to medications and treatments. Initially required NG tube with bowel decompression. Diet has slowly been transitioned and nowtolerating soft diet.Reports flatus andpos BM --CTA/P2/20w/ findings of dilated prox small bowel loops with mid bowel perienteric edema/inflammation; contrast able to migrate through terminal ileum and right colon --D/C'dNGT 2/23 --Toleratingsoft diet,successfully advanced to regular diet and tolerating --Continues to be noncompliant with PT/nursing --Palliative care following for assistance with goals of care; appreciate assistance --Encourage further mobilization, however pt refuses to move from laying position, stating she is afraid to stand -Multiple BM noted, pt is tolerating regular diet -Pt now agreeable for SNF -Recommend close outpatient follow up with [facility] Surgery  Acute on chronic hypoxic, hypercapnic respiratory failure secondary to COPD exacerbation. Human metapneumovirus Respiratory viral panel was positive for metapneumovirus. Completed 4-day course of Levaquin and steroid taper. --Continue nebs as needed --Continues on supplemental oxygen, 2LNC (on 2LNC at home); continue to titrate for SPO2 >88% -On minimal O2 support at  present  Hypernatremia Sodium improved. Etiology likely secondary to IV fluids vs dehydration.  --Normalized --Cont to tolerate regular diet  Leukocytosis: Resolved Likely secondary to steroids.  --Steroid taper complete  History of  GERD. Continue PPI.  Acute kidney injury on chronic kidney disease stage III.  Renal ultrasound showed echogenic kidneys, no hydronephrosis. Etiology likely secondary to dehydration from ileus. --Creatinine now rising -Had started LR at 75cc/hr with improvement in renal function  Moderate protein calorie malnutrition --DiscontinuedNG tube 2/23 --Nutritionfollowing, appreciate assistance  Hypertension --Started amlodipine 5 mg p.o. daily;patient refusing intermittently -BP stable  Anemiaof chronic disease -Stable at this time -Hemodynamically stable  Diabetes mellitus type 2 -Hemoglobin A1c 6.1 on 11/08/2018. On Lantus and sliding scale insulin q6h. -Stable at this time  Urinary retention:We will need to closely monitor.Recently had a Foley catheter  Hypokalemia Hypomagnesemia Potassium stable thus far  Enlarged abdominal aortic aneurysm Subtle finding on CT abdomen/pelvis with AAA measuring 4.6 cm in diameter. Recommend follow-up abdomen/pelvis CTA in 6 months and outpatient vascular surgery referral.  Noncompliance to treatment plan Debility, deconditioning. Intermittent. Not motivated for physical therapy. Initial PT evaluation recommended SNF placement; patient adamantly refused and wished to return home -Pt now agreeable for SNF for rehab  Discharge Instructions   Allergies as of 11/30/2018      Reactions   Aspartame And Phenylalanine Nausea And Vomiting   Patient says she is allergic to all artificial sweeteners   Doxycycline Other (See Comments)   Nausea, vomiting, HA, double vision   Tramadol Shortness Of Breath, Nausea Only   Can tolerate Morphine/Dilaudid IV, Percocet per pt report. 11/08/18.    Neurontin [gabapentin] Other (See Comments)   Dizzy, "drugged" feeling, sleepy   Sulfa Antibiotics Other (See Comments)   Kidney problem    Symbicort [budesonide-formoterol Fumarate]    Swelling of face and inside of mouth per pt   Penicillins Rash   Has patient had a PCN reaction causing immediate rash, facial/tongue/throat swelling, SOB or lightheadedness with hypotension: No Has patient had a PCN reaction causing severe rash involving mucus membranes or skin necrosis: No Has patient had a PCN reaction that required hospitalization No Has patient had a PCN reaction occurring within the last 10 years: No If all of the above answers are "NO", then may proceed with Cephalosporin use.   Prednisone Rash   Patient stated she received Prednisone while she was in the hospital and experienced a rash and "extreme pain.'      Medication List    STOP taking these medications   blood glucose meter kit and supplies Kit   glucose blood test strip Commonly known as:  IGLUCOSE TEST STRIPS   Lancets Misc     TAKE these medications   albuterol 0.63 MG/3ML nebulizer solution Commonly known as:  ACCUNEB Take 1 ampule by nebulization every 6 (six) hours as needed for wheezing or shortness of breath.   albuterol 108 (90 Base) MCG/ACT inhaler Commonly known as:  PROAIR HFA Inhale 2 puffs into the lungs every 6 (six) hours as needed for wheezing or shortness of breath.   amLODipine 10 MG tablet Commonly known as:  NORVASC Take 1 tablet (10 mg total) by mouth daily.   benzonatate 100 MG capsule Commonly known as:  TESSALON PERLES Take 1 capsule (100 mg total) by mouth every 6 (six) hours as needed for cough.   cyclobenzaprine 5 MG tablet Commonly known as:  FLEXERIL TAKE 1 TABLET(5 MG) BY MOUTH THREE TIMES DAILY AS NEEDED FOR MUSCLE SPASMS What changed:  See the new instructions.   furosemide 20 MG tablet Commonly known as:  LASIX Take 1 tablet (20 mg total) by mouth daily. What changed:  when to take this  reasons to take this  Another medication with the same name was removed. Continue taking this medication, and follow the directions you see here.   guaifenesin 400 MG Tabs tablet Commonly known as:  HUMIBID E Take 400 mg by mouth 2 (two) times daily.   hydrALAZINE 25 MG tablet Commonly known as:  APRESOLINE Take 1 tablet (25 mg total) by mouth every 8 (eight) hours.   labetalol 100 MG tablet Commonly known as:  NORMODYNE Take 1 tablet (100 mg total) by mouth 2 (two) times daily.   pantoprazole 40 MG tablet Commonly known as:  PROTONIX TAKE 1 TABLET(40 MG) BY MOUTH DAILY What changed:  See the new instructions.   tamsulosin 0.4 MG Caps capsule Commonly known as:  FLOMAX Take 1 capsule (0.4 mg total) by mouth daily after supper for 30 days.   tiotropium 18 MCG inhalation capsule Commonly known as:  SPIRIVA HANDIHALER Place 1 capsule (18 mcg total) into inhaler and inhale daily.       Contact information for follow-up providers    Martyn Ehrich, NP Follow up on 11/18/2018.   Specialty:  Pulmonary Disease Why:  2:00pm Contact information: [address] [place] [address] [phone]        Hoyt Koch, MD. Schedule an appointment as soon as possible for a visit in 1 week(s).   Specialty:  Internal Medicine Contact information: [place] [phone]            Contact information for after-discharge care    Destination    [facility] .   Service:  Skilled Nursing Contact information: [address] [place] [phone]                 Allergies  Allergen Reactions  . Aspartame And Phenylalanine Nausea And Vomiting    Patient says she is allergic to all artificial sweeteners  . Doxycycline Other (See Comments)    Nausea, vomiting, HA, double vision  . Tramadol Shortness Of Breath and Nausea Only    Can tolerate  Morphine/Dilaudid IV, Percocet per pt report. 11/08/18.  Marland Kitchen Neurontin [Gabapentin] Other (See Comments)    Dizzy, "drugged" feeling, sleepy  . Sulfa Antibiotics Other (See Comments)    Kidney problem   . Symbicort [Budesonide-Formoterol Fumarate]     Swelling of face and inside of mouth per pt  . Penicillins Rash    Has patient had a PCN reaction causing immediate rash, facial/tongue/throat swelling, SOB or lightheadedness with hypotension: No Has patient had a PCN reaction causing severe rash involving mucus membranes or skin necrosis: No Has patient had a PCN reaction that required hospitalization No Has patient had a PCN reaction occurring within the last 10 years: No If all of the above answers are "NO", then may proceed with Cephalosporin use.  . Prednisone Rash    Patient stated she received Prednisone while she was in the hospital and experienced a rash and "extreme pain.'     Procedures/Studies: Dg Chest 1 View  Result Date: 11/12/2018 CLINICAL DATA:  Shortness of breath.  Chronic respiratory failure. EXAM: CHEST  1 VIEW COMPARISON:  Chest x-rays dated 11/11/2018, 11/09/2018 11/08/2018 and 10/24/2018 FINDINGS: Heart size and pulmonary vascularity are normal. No infiltrates or effusions. The lungs are hyperinflated consistent with previously demonstrated emphysema. Slight dilatation and calcification of the arch of the aorta. No significant change since the prior exams. IMPRESSION: Aortic  Atherosclerosis (ICD10-I70.0) and Emphysema (ICD10-J43.9). No acute abnormalities. Electronically Signed   By: Lorriane Shire M.D.   On: 11/12/2018 11:35   Dg Chest 1 View  Result Date: 11/08/2018 CLINICAL DATA:  Emphysematous changes, COPD, correlation with V/Q scan EXAM: CHEST  1 VIEW COMPARISON:  Portable exam 1457 hours compared to 11/06/2018 FINDINGS: Normal heart size, mediastinal contours, and pulmonary vascularity. Atherosclerotic calcification aorta. Emphysematous and minimal bronchitic changes  consistent with COPD. Minimally greater lucency of the RIGHT lung versus LEFT. Minimal interstitial prominence in the mid to lower lungs unchanged. No infiltrate, pleural effusion or pneumothorax. Bones demineralized. IMPRESSION: Changes of COPD and minimal chronic interstitial disease. No acute infiltrate. Electronically Signed   By: Lavonia Dana M.D.   On: 11/08/2018 15:32   Dg Abd 1 View  Result Date: 11/21/2018 CLINICAL DATA:  Abdominal pain. EXAM: ABDOMEN - 1 VIEW COMPARISON:  Radiograph of November 17, 2018. FINDINGS: Distal tip of nasogastric tube is seen in expected position of distal stomach. Continued small bowel dilatation is noted concerning for distal small bowel obstruction. No colonic dilatation is noted. Stool is noted in the colon. Status post cholecystectomy. IMPRESSION: Continued small bowel dilatation is noted concerning for distal small bowel obstruction. Electronically Signed   By: Marijo Conception, M.D.   On: 11/21/2018 09:16   Dg Abd 1 View  Result Date: 11/17/2018 CLINICAL DATA:  Constipation, ileus, vomiting. Best obtainable images, pt actively vomiting and had difficulty with positioning of x-ray. H/o Diverticulitis. EXAM: ABDOMEN - 1 VIEW COMPARISON:  Radiograph 11/15/2018 FINDINGS: Persistent gas distended loops of small bowel measuring between 4 and 5 cm. The degree of distention is similar to comparison exam 2 days prior. Findings may be slightly improved from radiograph 11/14/2018. There is small amount of gas and stool in the rectum. Stool in the ascending colon. IMPRESSION: Potential mild improvement in small bowel pattern. Favor ileus over obstruction. Recommend clinical correlation for signs obstruction. Significant gaseous distention of the small bowel remains. Electronically Signed   By: Suzy Bouchard M.D.   On: 11/17/2018 15:07   Dg Abd 1 View  Result Date: 11/13/2018 CLINICAL DATA:  Ileus EXAM: ABDOMEN - 1 VIEW COMPARISON:  Yesterday FINDINGS: Unchanged  generalized gaseous small bowel distention. Gas is seen within the colon, reaching the sigmoid colon and rectum on prior. Moderate gastric distention. No evidence of pneumatosis. No concerning gas collection. Cholecystectomy clips. IMPRESSION: History of ileus with unchanged gaseous bowel distension. Electronically Signed   By: Monte Fantasia M.D.   On: 11/13/2018 08:27   Ct Abdomen Pelvis W Contrast  Result Date: 11/21/2018 CLINICAL DATA:  Abdominal pain with nausea vomiting. Small bowel obstruction. EXAM: CT ABDOMEN AND PELVIS WITH CONTRAST TECHNIQUE: Multidetector CT imaging of the abdomen and pelvis was performed using the standard protocol following bolus administration of intravenous contrast. CONTRAST:  57m OMNIPAQUE IOHEXOL 300 MG/ML SOLN, 36mOMNIPAQUE IOHEXOL 300 MG/ML SOLN COMPARISON:  12/16/2016 FINDINGS: Lower chest: Multiple pulmonary nodules are identified in the lung bases bilaterally, measuring up to 6 mm (left lower lobe on image 26/4). This nodule and the other basilar nodules were present on the previous study from 2 years ago that are not substantially changed consistent with benign etiology. Hepatobiliary: No suspicious focal abnormality within the liver parenchyma. Gallbladder surgically absent. No intrahepatic or extrahepatic biliary dilation. Pancreas: No focal mass lesion. No dilatation of the main duct. No intraparenchymal cyst. No peripancreatic edema. Spleen: Scattered tiny hypoattenuating foci in the splenic parenchyma are slightly more conspicuous  than previous, but were evident on the prior exam. Adrenals/Urinary Tract: No adrenal nodule or mass. Kidneys unremarkable aside from bilateral central sinus cysts. No evidence for hydroureter. The urinary bladder appears normal for the degree of distention. Stomach/Bowel: Stomach is decompressed. NG tube tip is in the antrum. Duodenum is normally positioned as is the ligament of Treitz. Small bowel loops are distended proximally,  measuring up to 3.7 cm diameter. Distended loops track into a 15-20 cm segment of mid small bowel that demonstrates mild circumferential wall thickening but no substantial perienteric edema/inflammation (image 62/2). Small bowel distal to this point appears decompressed although contrast has migrated through to the terminal ileum and right colon. Diverticular changes are noted in the left colon without evidence of diverticulitis. Mild circumferential wall thickening is identified in the distal rectum (78/2). Vascular/Lymphatic: Abdominal aortic aneurysm measures up to 4.6 cm diameter. There is no gastrohepatic or hepatoduodenal ligament lymphadenopathy. No intraperitoneal or retroperitoneal lymphadenopathy. No pelvic sidewall lymphadenopathy. Reproductive: Uterus surgically absent.  There is no adnexal mass. Other: No intraperitoneal free fluid. Musculoskeletal: No worrisome lytic or sclerotic osseous abnormality. IMPRESSION: 1. Dilated proximal small bowel loops measuring up to 3.7 cm diameter. Distended loops extend into a 15-20 cm segment of mid small bowel which demonstrates mild circumferential wall thickening, but no substantial peri-enteric edema or inflammation. Features may be related to an infectious/inflammatory enteritis. No similar features were visible on the prior study to suggest chronic stricture. Small bowel distal to this point appears decompressed although contrast has migrated through to the terminal ileum and right colon. 2. Mild circumferential wall thickening distal rectum. Consider infectious/inflammatory proctitis. 3. Abdominal aortic aneurysm measuring up to 4.6 cm diameter. Recommend followup by abdomen and pelvis CTA in 6 months, and vascular surgery referral/consultation if not already obtained. This recommendation follows ACR consensus guidelines: White Paper of the ACR Incidental Findings Committee II on Vascular Findings. J Am Coll Radiol 2013; 10:789-794. Aortic aneurysm NOS  (ICD10-I71.9) 4. Scattered basilar bilateral pulmonary nodules measure up to 6 mm, stable since 12/16/2016 compatible with benign etiology. Electronically Signed   By: Misty Stanley M.D.   On: 11/21/2018 18:42   US Renal  Result Date: 11/09/2018 CLINICAL DATA:  Acute renal failure. EXAM: RENAL / URINARY TRACT ULTRASOUND COMPLETE COMPARISON:  CT of the abdomen and pelvis on 12/16/2016 FINDINGS: Right Kidney: Renal measurements: 8.4 x 3.9 x 5.6 centimeters = volume: 96.6 mL. Renal parenchyma is echogenic and thin. No suspicious mass. No hydronephrosis. Left Kidney: Renal measurements: 8.6 x 4.5 x 4.8 centimeters = volume: 98.6 mL. Echogenic renal parenchyma and thin. No suspicious renal mass or hydronephrosis. Bladder: Appears normal for degree of bladder distention. IMPRESSION: Echogenic, thin renal parenchyma bilaterally. No hydronephrosis or suspicious renal mass. Electronically Signed   By: Nolon Nations M.D.   On: 11/09/2018 12:49   Nm Pulmonary Perf And Vent  Addendum Date: 11/08/2018   ADDENDUM REPORT: 11/08/2018 15:34 ADDENDUM: Comparison is now available to a current chest radiograph of 11/08/2018. Chest radiograph demonstrates emphysematous changes with slightly greater lucency of the RIGHT lung than LEFT. This likely accounts for the mild hyperexpansion and decreased ventilation/perfusion in the RIGHT lung versus LEFT on scintigraphy. Overall worse ventilation than perfusion in both lungs. Findings continue to represent a low probability for pulmonary embolism. Electronically Signed   By: Lavonia Dana M.D.   On: 11/08/2018 15:34   Result Date: 11/08/2018 CLINICAL DATA:  Shortness of breath and chest pain, history of interstitial lung disease, COPD, type II  diabetes mellitus, hypertension EXAM: NUCLEAR MEDICINE VENTILATION - PERFUSION LUNG SCAN TECHNIQUE: Ventilation images were obtained in multiple projections using inhaled aerosol Tc-57mDTPA. Perfusion images were obtained in multiple projections  after intravenous injection of Tc-941mAA. RADIOPHARMACEUTICALS:  29.6 mCi of Tc-9963mPA aerosol inhalation and 3.9 mCi Tc99m81m IV COMPARISON:  None Correlation: Chest radiograph 11/06/2018; no chest radiograph in past 24 hrs FINDINGS: Ventilation: Severely irregular ventilation throughout both lungs, with patchy areas of absent and decreased ventilation in both lungs. RIGHT lung volume appears increased versus LEFT. Most severe degrees of ventilation impairment are seen in the upper lobes bilaterally LEFT greater than RIGHT. Pattern of of ventilatory impairment is most consistent with parenchymal lung disease, a combination of COPD and minimal interstitial lung disease changes present on most recent chest radiograph. Perfusion: Much better perfusion than ventilation throughout both lungs. Generally diminished perfusion throughout the upper lobes in a nonsegmental fashion. Mild diffuse asymmetry of perfusion with less throughout RIGHT lung than LEFT. No focal segmental or subsegmental perfusion defects identified. Chest radiograph: Most recent exam demonstrates BILATERAL emphysematous changes and minimal interstitial prominence without acute infiltrate. IMPRESSION: Much worse ventilation and perfusion abnormalities throughout both lungs as above, pattern most consistent with emphysematous changes present on chest radiograph. Low probability for pulmonary embolism. Electronically Signed: By: MarkLavonia Dana. On: 11/08/2018 14:52   Dg Chest Port 1 View  Result Date: 11/11/2018 CLINICAL DATA:  Shortness of breath, COPD, hypertension EXAM: PORTABLE CHEST 1 VIEW COMPARISON:  None. FINDINGS: The heart size and mediastinal contours are within normal limits. Both lungs are clear. The visualized skeletal structures are unremarkable. IMPRESSION: No acute abnormality of the lungs in AP portable projection. Electronically Signed   By: AlexEddie Candle.   On: 11/11/2018 08:17   Dg Chest Port 1 View  Result Date:  11/09/2018 CLINICAL DATA:  72 y73r old female with history of coughing and shortness of breath. EXAM: PORTABLE CHEST 1 VIEW COMPARISON:  Chest x-ray 11/08/2018. FINDINGS: Lung volumes are normal. No consolidative airspace disease. No pleural effusions. No pneumothorax. No pulmonary nodule or mass noted. Pulmonary vasculature and the cardiomediastinal silhouette are within normal limits. Aortic atherosclerosis. IMPRESSION: 1. No radiographic evidence of acute cardiopulmonary disease. 2. Aortic atherosclerosis. Electronically Signed   By: DaniVinnie Langton.   On: 11/09/2018 13:54   Dg Chest Port 1 View  Result Date: 11/06/2018 CLINICAL DATA:  Worsening fatigue. Recent pneumonia. History of COPD. EXAM: PORTABLE CHEST 1 VIEW COMPARISON:  Chest radiograph October 26, 2018 FINDINGS: Cardiomediastinal silhouette is normal. Patient rotated to the RIGHT. Calcified aortic arch. Mild chronic interstitial changes increased lung volumes without pleural effusion or focal consolidation. No pneumothorax. Soft tissue planes and included osseous structures are non suspicious. IMPRESSION: No acute cardiopulmonary process. Emphysema (ICD10-J43.9). Aortic Atherosclerosis (ICD10-I70.0). Electronically Signed   By: CourElon Alas.   On: 11/06/2018 17:03   Dg Abd 2 Views  Result Date: 11/15/2018 CLINICAL DATA:  Ileus EXAM: ABDOMEN - 2 VIEW COMPARISON:  Yesterday FINDINGS: Continued generalized small bowel and colonic distension. There are fluid levels on the decubitus view without pneumoperitoneum. Superimposed ovoid soft tissue density over the right lower quadrant IMPRESSION: Continued ileus. Electronically Signed   By: JonaMonte Fantasia.   On: 11/15/2018 07:36   Dg Abd 2 Views  Result Date: 11/12/2018 CLINICAL DATA:  Abdominal pain and distention. EXAM: ABDOMEN - 2 VIEW COMPARISON:  Radiographs dated 03/19/2017 FINDINGS: There are multiple dilated loops of small bowel in the mid abdomen. Moderate  air in the  stomach. Scattered air in the nondistended colon. No visible free air on the decubitus view. No bone abnormality. IMPRESSION: Multiple dilated loops of small bowel which could represent small bowel obstruction. The colon is not distended. Electronically Signed   By: Lorriane Shire M.D.   On: 11/12/2018 11:43   Dg Abd Portable 1v  Result Date: 11/18/2018 CLINICAL DATA:  Repositioned gastric tube. EXAM: PORTABLE ABDOMEN - 1 VIEW COMPARISON:  11/17/2018 FINDINGS: The tip and side port of a gastric tube are now seen in the expected location of the distal stomach. Dilated small bowel loops are redemonstrated in the included upper abdomen in keeping with the patient's known high-grade partial or early SBO. Heart size and mediastinal contours are stable with ectatic slightly aneurysmal aortic arch. Lungs are relatively clear. Cholecystectomy clips are seen in the right upper quadrant. IMPRESSION: The tip and side port of a gastric tube are now seen in the expected location of the distal stomach. Abnormal small bowel dilatation consistent with partial or early high-grade small bowel obstruction. Electronically Signed   By: Ashley Royalty M.D.   On: 11/18/2018 03:00   Dg Abd Portable 1v  Result Date: 11/17/2018 CLINICAL DATA:  NG tube placement EXAM: PORTABLE ABDOMEN - 1 VIEW COMPARISON:  Abdomen radiographs from 1108 hours on the same day FINDINGS: The side port of a gastric tube is approximately 6 cm above the GE juncture and further advancement of the gastric tube by at least 10 cm is recommended. Dilated small bowel loops are again demonstrated. No free air is seen. Borderline cardiomegaly with tortuous atherosclerotic aorta. No effusion or pulmonary consolidation. No overt pulmonary edema. No acute osseous abnormality. IMPRESSION: Further advancement of the gastric tube by at least 10 cm is recommended as the side-port is noted above the GE junction by at least 6 cm. Dilated small bowel loops in keeping with small  bowel obstruction. Electronically Signed   By: Ashley Royalty M.D.   On: 11/17/2018 23:21   Dg Abd Portable 2v  Result Date: 11/14/2018 CLINICAL DATA:  Ileus EXAM: PORTABLE ABDOMEN - 2 VIEW COMPARISON:  11/13/2010 FINDINGS: A persistent diffuse dilated gas-filled loops of small bowel measuring up to 4 cm. Degree of distention is not changed. There is gas in the colon and rectum. No evidence of intraperitoneal free air on the portable decubitus view. IMPRESSION: Persistent diffuse small bowel gas distension consistent with ileus. No significant interval change. Electronically Signed   By: Suzy Bouchard M.D.   On: 11/14/2018 08:36    Subjective: Tolerating french toast without issues  Discharge Exam: Vitals:   11/30/18 0410 11/30/18 1043  BP: (!) 154/67 (!) 154/83  Pulse: 76 90  Resp: 19 (!) 24  Temp: 98 F (36.7 C) 98 F (36.7 C)  SpO2: 100% 98%   Vitals:   11/29/18 1953 11/30/18 0410 11/30/18 0802 11/30/18 1043  BP: 134/77 (!) 154/67  (!) 154/83  Pulse: 91 76  90  Resp: 13 19  (!) 24  Temp: 98.7 F (37.1 C) 98 F (36.7 C)  98 F (36.7 C)  TempSrc: Oral Oral  Oral  SpO2: 98% 100%  98%  Weight:   70 kg   Height:        General: Pt is alert, awake, not in acute distress Cardiovascular: RRR, S1/S2 +, no rubs, no gallops Respiratory: CTA bilaterally, no wheezing, no rhonchi Abdominal: Soft, NT, ND, bowel sounds + Extremities: no edema, no cyanosis   The results of  significant diagnostics from this hospitalization (including imaging, microbiology, ancillary and laboratory) are listed below for reference.     Microbiology: No results found for this or any previous visit (from the past 240 hour(s)).   Labs: BNP (last 3 results) Recent Labs    10/24/18 1912  BNP 45.9   Basic Metabolic Panel: Recent Labs  Lab 11/24/18 0528 11/25/18 0622 11/26/18 0543 11/27/18 0553 11/28/18 0527 11/29/18 0505 11/30/18 0524  NA 140 140 139 140 139 138 138  K 3.5 4.9 4.5 4.7 4.6  4.7 5.2*  CL 109 110 108 109 106 105 106  CO2 '25 25 26 25 29 30 29  ' GLUCOSE 101* 99 97 86 89 102* 101*  BUN '19 21 21 21 18 20 15  ' CREATININE 1.30* 1.28* 1.19* 1.24* 1.23* 1.53* 1.19*  CALCIUM 7.9* 8.1* 8.0* 8.4* 8.3* 7.9* 8.1*  MG 1.4* 1.9 1.7 1.8 1.7  --   --    Liver Function Tests: No results for input(s): AST, ALT, ALKPHOS, BILITOT, PROT, ALBUMIN in the last 168 hours. No results for input(s): LIPASE, AMYLASE in the last 168 hours. No results for input(s): AMMONIA in the last 168 hours. CBC: Recent Labs  Lab 11/24/18 0528 11/25/18 0622 11/26/18 0543 11/27/18 0553 11/28/18 0527  WBC 9.2 7.1 7.9 7.2 6.0  HGB 9.5* 9.4* 9.5* 9.1* 9.0*  HCT 31.4* 31.7* 31.9* 30.3* 30.6*  MCV 91.0 92.2 92.7 91.5 93.0  PLT 130* 121* 119* 119* 119*   Cardiac Enzymes: No results for input(s): CKTOTAL, CKMB, CKMBINDEX, TROPONINI in the last 168 hours. BNP: Invalid input(s): POCBNP CBG: Recent Labs  Lab 11/29/18 1740 11/29/18 1817 11/29/18 1951 11/30/18 0015 11/30/18 0531  GLUCAP 62* 105* 125* 91 95   D-Dimer No results for input(s): DDIMER in the last 72 hours. Hgb A1c No results for input(s): HGBA1C in the last 72 hours. Lipid Profile No results for input(s): CHOL, HDL, LDLCALC, TRIG, CHOLHDL, LDLDIRECT in the last 72 hours. Thyroid function studies No results for input(s): TSH, T4TOTAL, T3FREE, THYROIDAB in the last 72 hours.  Invalid input(s): FREET3 Anemia work up No results for input(s): VITAMINB12, FOLATE, FERRITIN, TIBC, IRON, RETICCTPCT in the last 72 hours. Urinalysis    Component Value Date/Time   COLORURINE YELLOW 11/08/2018 1847   APPEARANCEUR HAZY (A) 11/08/2018 1847   LABSPEC 1.014 11/08/2018 1847   PHURINE 5.0 11/08/2018 1847   GLUCOSEU NEGATIVE 11/08/2018 1847   HGBUR NEGATIVE 11/08/2018 1847   BILIRUBINUR NEGATIVE 11/08/2018 1847   KETONESUR NEGATIVE 11/08/2018 1847   PROTEINUR NEGATIVE 11/08/2018 1847   UROBILINOGEN 1.0 06/16/2015 1545   NITRITE NEGATIVE  11/08/2018 1847   LEUKOCYTESUR NEGATIVE 11/08/2018 1847   Sepsis Labs Invalid input(s): PROCALCITONIN,  WBC,  LACTICIDVEN Microbiology No results found for this or any previous visit (from the past 240 hour(s)).  Time spent: 30 min  SIGNED:   Marylu Lund, MD  [facility] 11/30/2018, 11:06 AM  If 7PM-7AM, please contact night-coverage

## 2018-11-28 LAB — CBC
HCT: 30.6 % — ABNORMAL LOW (ref 36.0–46.0)
Hemoglobin: 9 g/dL — ABNORMAL LOW (ref 12.0–15.0)
MCH: 27.4 pg (ref 26.0–34.0)
MCHC: 29.4 g/dL — ABNORMAL LOW (ref 30.0–36.0)
MCV: 93 fL (ref 80.0–100.0)
Platelets: 119 10*3/uL — ABNORMAL LOW (ref 150–400)
RBC: 3.29 MIL/uL — ABNORMAL LOW (ref 3.87–5.11)
RDW: 15.4 % (ref 11.5–15.5)
WBC: 6 10*3/uL (ref 4.0–10.5)
nRBC: 0 % (ref 0.0–0.2)

## 2018-11-28 LAB — BASIC METABOLIC PANEL
Anion gap: 4 — ABNORMAL LOW (ref 5–15)
BUN: 18 mg/dL (ref 8–23)
CO2: 29 mmol/L (ref 22–32)
Calcium: 8.3 mg/dL — ABNORMAL LOW (ref 8.9–10.3)
Chloride: 106 mmol/L (ref 98–111)
Creatinine, Ser: 1.23 mg/dL — ABNORMAL HIGH (ref 0.44–1.00)
GFR calc Af Amer: 51 mL/min — ABNORMAL LOW (ref >60)
GFR calc non Af Amer: 44 mL/min — ABNORMAL LOW (ref >60)
Glucose, Bld: 89 mg/dL (ref 70–99)
Potassium: 4.6 mmol/L (ref 3.5–5.1)
Sodium: 139 mmol/L (ref 135–145)

## 2018-11-28 LAB — GLUCOSE, CAPILLARY
GLUCOSE-CAPILLARY: 82 mg/dL (ref 70–99)
Glucose-Capillary: 143 mg/dL — ABNORMAL HIGH (ref 70–99)
Glucose-Capillary: 111 mg/dL — ABNORMAL HIGH (ref 70–99)

## 2018-11-28 LAB — MAGNESIUM: Magnesium: 1.7 mg/dL (ref 1.7–2.4)

## 2018-11-28 MED ORDER — DOCUSATE SODIUM 100 MG PO CAPS
100.0000 mg | ORAL_CAPSULE | Freq: Every day | ORAL | Status: DC
  Filled 2018-11-28 (×2): qty 1

## 2018-11-28 MED ORDER — BISACODYL 10 MG RE SUPP: 10.0000 mg | Freq: Every day | RECTAL | Status: DC | PRN

## 2018-11-28 NOTE — Progress Notes (Signed)
Pt encouraged this shift to ambulate in hall or to transfer to chair. Pt refused multiple attempts at mobility. Pt also refused nightly meds. Pt educated about the importance of the medication. All questions answered, pt comfortable with no needs at this time.

## 2018-11-28 NOTE — Progress Notes (Addendum)
Clinical Social Worker following patient for support and discharge needs. Patient stated at this time she is agreeable to go to a SNF for rehab. Patient stated she has never been to rehab before and is not sure of which facility to choice from. CSW encouraged patient to communicate with family and ask them for opinion of which facility she should discharge too.Patient was given a list of bed offers at bedside. CSW went over the process of SNF and stated that before patient can discharge she will need to go to have authorization through [facility].   Rhea Pink, MSW,  [place]

## 2018-11-28 NOTE — Progress Notes (Signed)
Occupational Therapy Treatment Patient Details Name: Victoria Bird MRN: [id number] DOB: April 17, 1946 Today's Date: 11/28/2018    History of present illness COPD home oxygen admit for constipation/Ileus   OT comments  Son present for OT session.  Pt refused to stand and show son she was able to move. Son planned to take her in car for 5 hours this day to [place].  Discussion with pt, son , MD and RN.  Pt needs SNF.  Follow Up Recommendations  SNF;Supervision/Assistance - 24 hour(Pt will decline all post-acute services)    Equipment Recommendations  Other (comment)(defer to next venue)       Precautions / Restrictions Precautions Precautions: Fall Precaution Comments: monitor O2 sats, pt on 2L O2 at baseline       Mobility Bed Mobility Overal bed mobility: Needs Assistance Bed Mobility: Supine to Sit     Supine to sit: Min assist Sit to supine: Supervision      Transfers                 General transfer comment: refused to stand        ADL either performed or assessed with clinical judgement   ADL Overall ADL's : Needs assistance/impaired                                       General ADL Comments: Pt did sit EOB with OT. Pt refused to stand.  Son, nurse and MD present for part of session. Explained need to assess pts ability to perform ADL actiivty as son is taking her to [place].  Pt continues to refused to stand and returned to supine               Cognition Arousal/Alertness: Awake/alert Behavior During Therapy: [facility] for tasks assessed/performed;Anxious Overall Cognitive Status: Within Functional Limits for tasks assessed                                 General Comments: pt self limiting                   Pertinent Vitals/ Pain       Faces Pain Scale: No hurt         Frequency  Min 2X/week        Progress Toward Goals  OT Goals(current goals can now be found in the care plan section)  Progress towards OT goals: Not progressing toward goals - comment(self limiting)  Acute Rehab OT Goals OT Goal Formulation: With patient Time For Goal Achievement: 11/23/18 Potential to Achieve Goals: Good  Plan Discharge plan remains appropriate       AM-PAC OT "6 Clicks" Daily Activity     Outcome Measure   Help from another person eating meals?: None Help from another person taking care of personal grooming?: A Little Help from another person toileting, which includes using toliet, bedpan, or urinal?: Total Help from another person bathing (including washing, rinsing, drying)?: A Lot Help from another person to put on and taking off regular upper body clothing?: A Little Help from another person to put on and taking off regular lower body clothing?: A Lot 6 Click Score: 15    End of Session Equipment Utilized During Treatment: Oxygen  OT Visit Diagnosis: Muscle weakness (generalized) (M62.81)   Activity Tolerance Patient limited by  fatigue;Other (comment)(self limiting)   Patient Left in bed;with call bell/phone within reach;with bed alarm set;with nursing/sitter in room;with family/visitor present   Nurse Communication Mobility status(half of contrast consumed)        Time: 1000-1028 OT Time Calculation (min): 28 min  Charges: OT General Charges $OT Visit: 1 Visit OT Treatments $Self Care/Home Management : 23-37 mins  Kari Baars, [place] [phone] Office- [phone]      Marcedes Tech, Edwena Felty D 11/28/2018, 1:36 PM

## 2018-11-28 NOTE — Progress Notes (Signed)
PROGRESS NOTE    Victoria Bird  GBT:[id number] DOB: 11-02-1945 DOA: 11/06/2018 PCP: Hoyt Koch, MD    Brief Narrative:  73 year old female history of chronic respiratory failure on 2 L nasal cannula chronically secondary to severe COPD with very severe obstruction on spirometry of July 2019, chronic kidney disease stage III- 4, former tobacco user quit in 2019, hypertension presented to the ED with worsening shortness of breath and was admitted for an acute COPD exacerbation. Patient on admission was placed in the stepdown unit and placed on BiPAP, IV steroids, scheduled nebulizer treatments. Patient subsequently improved was taken off BiPAP and transferred to the telemetry floor. Patient still with ongoing poor air movement. On 11/29/2018 patient with worsening shortness of breath, use of accessory muscles of respiration, and subsequently transferred back to the stepdown unit to be placed on BiPAP. During stepdown stay, patient did have constipation and ileus. Surgery was consulted. Surgery recommended conservative treatment but patient had compliance issues.Subsequently patient was transferred out of the stepdown unit but developed more abdominal distention and vomiting. She was then placed on a NG tube with intermittent suction with mild improvement in her symptoms. Family requested palliative care evaluation for her ongoing symptoms.   Assessment & Plan:   Principal Problem:   Acute on chronic respiratory failure with hypoxia and hypercapnia ([name]) Active Problems:   Hypertension   GERD (gastroesophageal reflux disease)   OSA (obstructive sleep apnea)   Diabetes mellitus type 2, controlled ([name])   COPD exacerbation ([name])   Tobacco abuse   COPD with acute exacerbation ([name])   Chronic respiratory failure with hypoxia ([name])   Acute exacerbation of chronic obstructive pulmonary disease (COPD) ([name])   Malnutrition of moderate degree   ARF (acute renal failure)  ([name])   Hyperkalemia   Abdominal discomfort   Palliative care by specialist   Goals of care, counseling/discussion   Intestinal occlusion ([name])   Abdominal distention, constipation Ileus versus small bowel obstruction Hospital course complicated by patient development of a small bowel obstruction/ileus. Initially evaluated by general surgery who signed off because patient's lack of compliance to medications and treatments. Initially required NG tube with bowel decompression. Diet has slowly been transitioned and nowtolerating soft diet.Reports flatus and pos BM --CTA/P2/20w/ findings of dilated prox small bowel loops with mid bowel perienteric edema/inflammation; contrast able to migrate through terminal ileum and right colon --D/C'dNGT 2/23 --Toleratingsoft diet, successfully advanced to regular diet and tolerating --Continues to be noncompliant with PT/nursing --Palliative care following for assistance with goals of care; appreciate assistance --Encourage further mobilization, however pt refuses to move from laying position, stating she is afraid to stand -Multiple BM noted. Will change scheduled dulcolax to PRN and add daily colace -Family in room, convinced patient to go to SNF for rehab  Acute on chronic hypoxic, hypercapnic respiratory failure secondary to COPD exacerbation. Human metapneumovirus Respiratory viral panel was positive for metapneumovirus. Completed 4-day course of Levaquin and steroid taper. --Continue nebs as needed --Continues on supplemental oxygen, 2LNC (on 2LNC at home); continue to titrate for SPO2 >88% -On minimal O2 support at present  Hypernatremia Sodium up to 147 on 2/19. Etiology likely secondary to IV fluids vs dehydration.  --Normalized --Tolerating regular diet  Leukocytosis: Resolved Likely secondary to steroids.  --Steroid taper complete  History of GERD. Continue PPI.  Acute kidney injury on chronic kidney disease  stage III.  Renal ultrasound showed echogenic kidneys, no hydronephrosis. Etiology likely secondary to dehydration from ileus. --Creatinine noted to have improved -  Cont to encourage PO intake  Moderate protein calorie malnutrition --DiscontinuedNG tube 2/23 --Nutritionfollowing, appreciate assistance  Hypertension --Started amlodipine 5 mg p.o. daily;patient refusing intermittently -BP stable  Anemiaof chronic disease -Stable at this time -Hemodynamically stable  Diabetes mellitus type 2 -Hemoglobin A1c 6.1 on 11/08/2018. On Lantus and sliding scale insulin q6h. -Stable at this time  Urinary retention:We will need to closely monitor.Recently had a Foley catheter  Hypokalemia Hypomagnesemia Potassium 4.7 today  Enlarged abdominal aortic aneurysm Subtle finding on CT abdomen/pelvis with AAA measuring 4.6 cm in diameter. Recommend follow-up abdomen/pelvis CTA in 6 months and outpatient vascular surgery referral.  Noncompliance to treatment plan Debility, deconditioning. Intermittent. Not motivated for physical therapy. Initial PT evaluation recommended SNF placement; patient adamantly refused and wished to return home -Today, pt refused to get up out of bed, stating she is afraid to stand. No agreeable to SNF placement   DVT prophylaxis: heparin subQ Code Status: Full Family Communication: Pt in room, family at bedside Disposition Plan: SNF  Consultants:     Procedures:     Antimicrobials: Anti-infectives (From admission, onward)   Start     Dose/Rate Route Frequency Ordered Stop   11/10/18 1800  levofloxacin (LEVAQUIN) tablet 500 mg     500 mg Oral Every 48 hours 11/09/18 1457 11/10/18 2000   11/08/18 1800  levofloxacin (LEVAQUIN) tablet 750 mg  Status:  Discontinued     750 mg Oral Every 48 hours 11/08/18 0841 11/09/18 1457   11/06/18 1930  levofloxacin (LEVAQUIN) IVPB 750 mg  Status:  Discontinued     750 mg 100 mL/hr over 90 Minutes  Intravenous Every 48 hours 11/06/18 1901 11/08/18 0841   11/06/18 1900  levofloxacin (LEVAQUIN) IVPB 750 mg  Status:  Discontinued     750 mg 100 mL/hr over 90 Minutes Intravenous Every 24 hours 11/06/18 1852 11/06/18 1900       Subjective: Tolerated regular diet. Refused to stand with OT, stating she is afraid to stand  Objective: Vitals:   11/27/18 2312 11/28/18 0436 11/28/18 0438 11/28/18 1532  BP: (!) 155/68  (!) 171/74 (!) 157/83  Pulse: 90  85 98  Resp: 18  16 14   Temp: 98 F (36.7 C)  97.6 F (36.4 C) 98.1 F (36.7 C)  TempSrc: Oral  Oral Oral  SpO2: 100%  100% 98%  Weight:  71.7 kg    Height:        Intake/Output Summary (Last 24 hours) at 11/28/2018 1559 Last data filed at 11/28/2018 1154 Gross per 24 hour  Intake 440 ml  Output 150 ml  Net 290 ml   Filed Weights   11/26/18 0627 11/27/18 0729 11/28/18 0436  Weight: 73.9 kg 72.7 kg 71.7 kg    Examination:  General exam: Appears calm and comfortable  Respiratory system: Clear to auscultation. Respiratory effort normal. Cardiovascular system: S1 & S2 heard, RRR Gastrointestinal system: Abdomen is nondistended, soft and nontender. No organomegaly or masses felt. Normal bowel sounds heard. Central nervous system: Alert and oriented. No focal neurological deficits. Extremities: Symmetric 5 x 5 power. Skin: No rashes, lesions Psychiatry: Judgement and insight appear normal. Mood & affect appropriate.   Data Reviewed: I have personally reviewed following labs and imaging studies  CBC: Recent Labs  Lab 11/24/18 0528 11/25/18 0622 11/26/18 0543 11/27/18 0553 11/28/18 0527  WBC 9.2 7.1 7.9 7.2 6.0  HGB 9.5* 9.4* 9.5* 9.1* 9.0*  HCT 31.4* 31.7* 31.9* 30.3* 30.6*  MCV 91.0 92.2 92.7 91.5 93.0  PLT 130* 121* 119* 119* 294*   Basic Metabolic Panel: Recent Labs  Lab 11/24/18 0528 11/25/18 0622 11/26/18 0543 11/27/18 0553 11/28/18 0527  NA 140 140 139 140 139  K 3.5 4.9 4.5 4.7 4.6  CL 109 110 108 109  106  CO2 25 25 26 25 29   GLUCOSE 101* 99 97 86 89  BUN 19 21 21 21 18   CREATININE 1.30* 1.28* 1.19* 1.24* 1.23*  CALCIUM 7.9* 8.1* 8.0* 8.4* 8.3*  MG 1.4* 1.9 1.7 1.8 1.7   GFR: Estimated Creatinine Clearance: 39.8 mL/min (A) (by C-G formula based on SCr of 1.23 mg/dL (H)). Liver Function Tests: No results for input(s): AST, ALT, ALKPHOS, BILITOT, PROT, ALBUMIN in the last 168 hours. No results for input(s): LIPASE, AMYLASE in the last 168 hours. No results for input(s): AMMONIA in the last 168 hours. Coagulation Profile: No results for input(s): INR, PROTIME in the last 168 hours. Cardiac Enzymes: No results for input(s): CKTOTAL, CKMB, CKMBINDEX, TROPONINI in the last 168 hours. BNP (last 3 results) Recent Labs    04/10/18 1715  PROBNP 37.0   HbA1C: No results for input(s): HGBA1C in the last 72 hours. CBG: Recent Labs  Lab 11/27/18 1155 11/27/18 1642 11/27/18 2310 11/28/18 0615 11/28/18 1139  GLUCAP 127* 100* 100* 82 143*   Lipid Profile: No results for input(s): CHOL, HDL, LDLCALC, TRIG, CHOLHDL, LDLDIRECT in the last 72 hours. Thyroid Function Tests: No results for input(s): TSH, T4TOTAL, FREET4, T3FREE, THYROIDAB in the last 72 hours. Anemia Panel: No results for input(s): VITAMINB12, FOLATE, FERRITIN, TIBC, IRON, RETICCTPCT in the last 72 hours. Sepsis Labs: No results for input(s): PROCALCITON, LATICACIDVEN in the last 168 hours.  No results found for this or any previous visit (from the past 240 hour(s)).   Radiology Studies: No results found.  Scheduled Meds: . amLODipine  5 mg Oral Daily  . bisacodyl  10 mg Rectal Daily  . feeding supplement (ENSURE ENLIVE)  237 mL Oral BID BM  . fluticasone  2 spray Each Nare Daily  . heparin  5,000 Units Subcutaneous Q8H  . insulin aspart  0-9 Units Subcutaneous Q6H  . insulin glargine  10 Units Subcutaneous Daily  . mouth rinse  15 mL Mouth Rinse BID  . multivitamin  15 mL Oral Daily  . pantoprazole  40 mg  Oral BID  . potassium chloride  40 mEq Oral BID  . sodium chloride flush  3 mL Intravenous Q12H  . tamsulosin  0.4 mg Oral QPC supper   Continuous Infusions:   LOS: 22 days   Marylu Lund, MD [facility] Pager On Amion  If 7PM-7AM, please contact night-coverage 11/28/2018, 3:59 PM

## 2018-11-28 NOTE — NC FL2 (Signed)
[place] MEDICAID FL2 LEVEL OF CARE SCREENING TOOL     IDENTIFICATION  Patient Name: Victoria Bird Birthdate: 12/11/1945 Sex: female Admission Date (Current Location): 11/06/2018  [name] and [place] Number:  Herbalist and Address:  [facility],  [address], [place]      Provider Number: [id number]  Attending Physician Name and Address:  Donne Hazel, MD  Relative Name and Phone Number:  Harolyn Rutherford, [phone]    Current Level of Care: Hospital Recommended Level of Care: [place] Prior Approval Number:    Date Approved/Denied:   PASRR Number: [id number]  Discharge Plan: SNF    Current Diagnoses: Patient Active Problem List   Diagnosis Date Noted  . Intestinal occlusion ([name])   . Palliative care by specialist   . Goals of care, counseling/discussion   . Hyperkalemia 11/12/2018  . Abdominal discomfort 11/12/2018  . ARF (acute renal failure) ([place])   . Malnutrition of moderate degree 11/08/2018  . Acute exacerbation of chronic obstructive pulmonary disease (COPD) ([place]) 11/07/2018  . PNA (pneumonia) 10/24/2018  . Neck pain 07/29/2018  . Right lower quadrant abdominal pain 07/29/2018  . Swallowing problem 07/29/2018  . Lung nodules 04/18/2018  . Chronic respiratory failure with hypoxia ([place]) 04/18/2018  . Diastolic CHF ([place]) 55/37/4827  . COPD with acute exacerbation ([place]) 11/08/2017  . Vertigo 03/03/2017  . Fibromyalgia 01/18/2017  . Tobacco abuse 12/17/2016  . CKD (chronic kidney disease), stage III ([place]) 12/15/2016  . AAA (abdominal aortic aneurysm) without rupture ([place]) 06/08/2016  . Right-sided low back pain without sciatica   . COPD exacerbation ([place])   . Adjustment disorder with mixed anxiety and depressed mood 02/25/2016  . Overweight (BMI 25.0-29.9) 01/25/2016  . Allergic rhinitis 11/17/2015  . Tobacco use disorder 06/22/2015  . Diabetes mellitus type 2, controlled ([place]) 06/16/2015   . H/O recurrent pneumonia 05/27/2015  . Acute on chronic respiratory failure with hypoxia and hypercapnia ([place]) 05/27/2015  . GERD (gastroesophageal reflux disease) 05/27/2015  . Hyperlipidemia 05/27/2015  . Chronic pain syndrome 05/27/2015  . Thyroid nodule 01/20/2015  . COPD (chronic obstructive pulmonary disease) ([place]) 10/09/2014  . Hypertension 12/14/2011  . OSA (obstructive sleep apnea) 10/02/2008    Orientation RESPIRATION BLADDER Height & Weight     Self, Time, Situation, Place  O2(2L) Incontinent Weight: 158 lb 1.1 oz (71.7 kg) Height:  5\' 3"  (160 cm)  BEHAVIORAL SYMPTOMS/MOOD NEUROLOGICAL BOWEL NUTRITION STATUS      Incontinent Diet(regular)  AMBULATORY STATUS COMMUNICATION OF NEEDS Skin   Extensive Assist Verbally Normal                       Personal Care Assistance Level of Assistance  Bathing, Feeding, Dressing Bathing Assistance: Limited assistance Feeding assistance: Limited assistance Dressing Assistance: Limited assistance     Functional Limitations Info  Sight, Hearing, Speech Sight Info: Adequate Hearing Info: Adequate Speech Info: Adequate    SPECIAL CARE FACTORS FREQUENCY  PT (By licensed PT), OT (By licensed OT)     PT Frequency: 5x wk OT Frequency: 5x wk            Contractures Contractures Info: Not present    Additional Factors Info  Code Status, Allergies Code Status Info: full code Allergies Info: ASPARTAME AND PHENYLALANINE, DOXYCYCLINE, TRAMADOL, NEURONTIN GABAPENTIN, SULFA ANTIBIOTICS, SYMBICORT BUDESONIDE-FORMOTEROL FUMARATE, PENICILLINS, PREDNISONE           Current Medications (11/28/2018):  This is the current hospital active  medication list Current Facility-Administered Medications  Medication Dose Route Frequency Provider Last Rate Last Dose  . acetaminophen (TYLENOL) tablet 650 mg  650 mg Oral Q6H PRN [place] Eric J, DO      . albuterol (PROVENTIL) (2.5 MG/3ML) 0.083% nebulizer solution 2.5 mg  2.5 mg  Nebulization Q4H PRN Pokhrel, Laxman, MD   2.5 mg at 11/12/18 0636  . amLODipine (NORVASC) tablet 5 mg  5 mg Oral Daily [place] Donnamarie Poag, DO   5 mg at 11/28/18 4627  . bisacodyl (DULCOLAX) suppository 10 mg  10 mg Rectal Daily PRN Donne Hazel, MD      . docusate sodium (COLACE) capsule 100 mg  100 mg Oral Daily Donne Hazel, MD      . feeding supplement (ENSURE ENLIVE) (ENSURE ENLIVE) liquid 237 mL  237 mL Oral BID BM [place] Eric J, DO   237 mL at 11/26/18 1420  . fluticasone (FLONASE) 50 MCG/ACT nasal spray 2 spray  2 spray Each Nare Daily Pokhrel, Laxman, MD   2 spray at 11/27/18 0919  . heparin injection 5,000 Units  5,000 Units Subcutaneous Q8H Pokhrel, Laxman, MD   5,000 Units at 11/28/18 1434  . hydrALAZINE (APRESOLINE) injection 5 mg  5 mg Intravenous Q6H PRN Minda Ditto, RPH   5 mg at 11/27/18 0316  . HYDROcodone-acetaminophen (NORCO/VICODIN) 5-325 MG per tablet 1 tablet  1 tablet Oral Q6H PRN [place] Eric J, DO   1 tablet at 11/26/18 2314  . HYDROcodone-homatropine (HYCODAN) 5-1.5 MG/5ML syrup 5 mL  5 mL Oral Q6H PRN Pokhrel, Laxman, MD      . insulin aspart (novoLOG) injection 0-9 Units  0-9 Units Subcutaneous Q6H Pokhrel, Laxman, MD   1 Units at 11/28/18 1216  . insulin glargine (LANTUS) injection 10 Units  10 Units Subcutaneous Daily Pokhrel, Laxman, MD   10 Units at 11/28/18 0930  . labetalol (NORMODYNE,TRANDATE) injection 10 mg  10 mg Intravenous Q2H PRN [place] Eric J, DO   10 mg at 11/26/18 2331  . MEDLINE mouth rinse  15 mL Mouth Rinse BID Pokhrel, Laxman, MD   15 mL at 11/24/18 2143  . metoCLOPramide (REGLAN) injection 10 mg  10 mg Intravenous Q8H PRN Pokhrel, Laxman, MD   10 mg at 11/17/18 1727  . multivitamin liquid 15 mL  15 mL Oral Daily [place] Donnamarie Poag, DO      . ondansetron [facility]) injection 4 mg  4 mg Intravenous Q4H PRN Pokhrel, Laxman, MD   4 mg at 11/17/18 1539  . pantoprazole (PROTONIX) EC tablet 40 mg  40 mg Oral BID Lynelle Doctor, RPH   40 mg at 11/28/18  0931  . phenol (CHLORASEPTIC) mouth spray 1 spray  1 spray Mouth/Throat PRN Pokhrel, Laxman, MD   1 spray at 11/21/18 0029  . potassium chloride SA (K-DUR,KLOR-CON) CR tablet 40 mEq  40 mEq Oral BID [place] Eric J, DO   40 mEq at 11/28/18 0931  . sodium chloride flush (NS) 0.9 % injection 3 mL  3 mL Intravenous Q12H Pokhrel, Laxman, MD   3 mL at 11/26/18 2325  . tamsulosin (FLOMAX) capsule 0.4 mg  0.4 mg Oral QPC supper Pokhrel, Laxman, MD   0.4 mg at 11/25/18 1652     Discharge Medications: Please see discharge summary for a list of discharge medications.  Relevant Imaging Results:  Relevant Lab Results:   Additional Information SS#: [id number]  Wende Neighbors, LCSW

## 2018-11-29 LAB — GLUCOSE, CAPILLARY
Glucose-Capillary: 105 mg/dL — ABNORMAL HIGH (ref 70–99)
Glucose-Capillary: 112 mg/dL — ABNORMAL HIGH (ref 70–99)
Glucose-Capillary: 125 mg/dL — ABNORMAL HIGH (ref 70–99)
Glucose-Capillary: 162 mg/dL — ABNORMAL HIGH (ref 70–99)
Glucose-Capillary: 62 mg/dL — ABNORMAL LOW (ref 70–99)
Glucose-Capillary: 98 mg/dL (ref 70–99)

## 2018-11-29 LAB — BASIC METABOLIC PANEL
Anion gap: 3 — ABNORMAL LOW (ref 5–15)
BUN: 20 mg/dL (ref 8–23)
CO2: 30 mmol/L (ref 22–32)
CREATININE: 1.53 mg/dL — AB (ref 0.44–1.00)
Calcium: 7.9 mg/dL — ABNORMAL LOW (ref 8.9–10.3)
Chloride: 105 mmol/L (ref 98–111)
GFR calc Af Amer: 39 mL/min — ABNORMAL LOW (ref >60)
GFR calc non Af Amer: 34 mL/min — ABNORMAL LOW (ref >60)
Glucose, Bld: 102 mg/dL — ABNORMAL HIGH (ref 70–99)
Potassium: 4.7 mmol/L (ref 3.5–5.1)
Sodium: 138 mmol/L (ref 135–145)

## 2018-11-29 MED ORDER — LACTATED RINGERS IV SOLN
INTRAVENOUS | Status: DC
  Administered 2018-11-29 – 2018-11-30 (×3): via INTRAVENOUS

## 2018-11-29 MED ORDER — CLONIDINE HCL 0.1 MG PO TABS
0.1000 mg | ORAL_TABLET | Freq: Once | ORAL | Status: AC
  Administered 2018-11-29: 0.1 mg via ORAL
  Filled 2018-11-29: qty 1

## 2018-11-29 NOTE — Progress Notes (Signed)
PT Cancellation Note  Patient Details Name: TONNYA GARBETT MRN: [id number] DOB: [birth year]   Cancelled Treatment:     "I'm sick", "I got up earlier and that's all I'm going to do".   Pt has been evaluated with rec for SNF for ST Rehab to regain prior level of mobility.   Rica Koyanagi  PTA Acute  Rehabilitation Services Pager      [phone] Office      [phone]

## 2018-11-29 NOTE — Progress Notes (Signed)
Occupational Therapy Treatment Patient Details Name: Victoria Bird MRN: [id number] DOB: [birth year] Today's Date: 11/29/2018    History of present illness COPD home oxygen admit for constipation/Ileus   OT comments  Pt self limiting will need SNF .  Follow Up Recommendations  SNF    Equipment Recommendations  Other (comment)(defer to next venue)       Precautions / Restrictions Precautions Precautions: Fall Precaution Comments: monitor O2 sats, pt on 2L O2 at baseline       Mobility Bed Mobility Overal bed mobility: Needs Assistance Bed Mobility: Supine to Sit     Supine to sit: Min assist Sit to supine: Supervision;Min assist      Transfers Overall transfer level: Needs assistance Equipment used: 2 person hand held assist Transfers: Sit to/from Stand Sit to Stand: Total assist;+2 physical assistance Stand pivot transfers: Total assist;+2 physical assistance            Balance Overall balance assessment: Needs assistance Sitting-balance support: Feet supported;Bilateral upper extremity supported Sitting balance-Leahy Scale: Fair       Standing balance-Leahy Scale: Zero                             ADL either performed or assessed with clinical judgement   ADL Overall ADL's : Needs assistance/impaired     Grooming: Minimal assistance;Sitting Grooming Details (indicate cue type and reason): sitting EOB                     Toileting- Clothing Manipulation and Hygiene: +2 for physical assistance;+2 for safety/equipment;Total assistance;Sit to/from stand;Cueing for sequencing;Cueing for safety Toileting - Clothing Manipulation Details (indicate cue type and reason): unable to stand fully with 2 person A.  Pt with limited effort.        General ADL Comments: Pt did stand EOB with OT with 2 person A.  Pt complained of not feeling well and stating she cant. Encouraged pt to try. Pt with limited effort.     Vision Patient Visual  Report: No change from baseline            Cognition Arousal/Alertness: Awake/alert Behavior During Therapy: WFL for tasks assessed/performed;Anxious Overall Cognitive Status: Within Functional Limits for tasks assessed                                 General Comments: pt self limiting                   Pertinent Vitals/ Pain       Pain Assessment: No/denies pain     Prior Functioning/Environment              Frequency  Min 2X/week        Progress Toward Goals  OT Goals(current goals can now be found in the care plan section)  Progress towards OT goals: (self limiting)  Acute Rehab OT Goals OT Goal Formulation: With patient Time For Goal Achievement: 11/23/18 Potential to Achieve Goals: Good  Plan Discharge plan remains appropriate       AM-PAC OT "6 Clicks" Daily Activity     Outcome Measure   Help from another person eating meals?: None Help from another person taking care of personal grooming?: A Little Help from another person toileting, which includes using toliet, bedpan, or urinal?: Total Help from another person bathing (including washing, rinsing, drying)?: A Lot  Help from another person to put on and taking off regular upper body clothing?: A Little Help from another person to put on and taking off regular lower body clothing?: Total 6 Click Score: 14    End of Session Equipment Utilized During Treatment: Oxygen  OT Visit Diagnosis: Muscle weakness (generalized) (M62.81)   Activity Tolerance Other (comment)(self limiting)   Patient Left in bed;with call bell/phone within reach;with bed alarm set;with family/visitor present   Nurse Communication Mobility status(half of contrast consumed)        Time: 6389-3734 OT Time Calculation (min): 25 min  Charges: OT General Charges $OT Visit: 1 Visit OT Treatments $Self Care/Home Management : 23-37 mins  Kari Baars, [place] [phone] Office- [phone]      Evalyn Shultis, Edwena Felty D 11/29/2018, 3:59 PM

## 2018-11-29 NOTE — Progress Notes (Signed)
PROGRESS NOTE    Victoria Bird  XHB:[id number] DOB: 12-16-1945 DOA: 11/06/2018 PCP: Hoyt Koch, MD    Brief Narrative:  73 year old female history of chronic respiratory failure on 2 L nasal cannula chronically secondary to severe COPD with very severe obstruction on spirometry of July 2019, chronic kidney disease stage III- 4, former tobacco user quit in 2019, hypertension presented to the ED with worsening shortness of breath and was admitted for an acute COPD exacerbation. Patient on admission was placed in the stepdown unit and placed on BiPAP, IV steroids, scheduled nebulizer treatments. Patient subsequently improved was taken off BiPAP and transferred to the telemetry floor. Patient still with ongoing poor air movement. On 11/29/2018 patient with worsening shortness of breath, use of accessory muscles of respiration, and subsequently transferred back to the stepdown unit to be placed on BiPAP. During stepdown stay, patient did have constipation and ileus. Surgery was consulted. Surgery recommended conservative treatment but patient had compliance issues.Subsequently patient was transferred out of the stepdown unit but developed more abdominal distention and vomiting. She was then placed on a NG tube with intermittent suction with mild improvement in her symptoms. Family requested palliative care evaluation for her ongoing symptoms.   Assessment & Plan:   Principal Problem:   Acute on chronic respiratory failure with hypoxia and hypercapnia ([name]) Active Problems:   Hypertension   GERD (gastroesophageal reflux disease)   OSA (obstructive sleep apnea)   Diabetes mellitus type 2, controlled ([name])   COPD exacerbation ([name])   Tobacco abuse   COPD with acute exacerbation ([name])   Chronic respiratory failure with hypoxia ([name])   Acute exacerbation of chronic obstructive pulmonary disease (COPD) ([name])   Malnutrition of moderate degree   ARF (acute renal failure)  ([name])   Hyperkalemia   Abdominal discomfort   Palliative care by specialist   Goals of care, counseling/discussion   Intestinal occlusion ([name])   Abdominal distention, constipation Ileus versus small bowel obstruction Hospital course complicated by patient development of a small bowel obstruction/ileus. Initially evaluated by general surgery who signed off because patient's lack of compliance to medications and treatments. Initially required NG tube with bowel decompression. Diet has slowly been transitioned and nowtolerating soft diet.Reports flatus and pos BM --CTA/P2/20w/ findings of dilated prox small bowel loops with mid bowel perienteric edema/inflammation; contrast able to migrate through terminal ileum and right colon --D/C'dNGT 2/23 --Toleratingsoft diet, successfully advanced to regular diet and tolerating --Continues to be noncompliant with PT/nursing --Palliative care following for assistance with goals of care; appreciate assistance --Encourage further mobilization, however pt refuses to move from laying position, stating she is afraid to stand -Multiple BM noted. Will change scheduled dulcolax to PRN and add daily colace -Pt now agreeable for SNF  Acute on chronic hypoxic, hypercapnic respiratory failure secondary to COPD exacerbation. Human metapneumovirus Respiratory viral panel was positive for metapneumovirus. Completed 4-day course of Levaquin and steroid taper. --Continue nebs as needed --Continues on supplemental oxygen, 2LNC (on 2LNC at home); continue to titrate for SPO2 >88% -On minimal O2 support at present  Hypernatremia Sodium up to 147 on 2/19. Etiology likely secondary to IV fluids vs dehydration.  --Normalized --Cont to tolerate regular diet  Leukocytosis: Resolved Likely secondary to steroids.  --Steroid taper complete  History of GERD. Continue PPI.  Acute kidney injury on chronic kidney disease stage III.  Renal  ultrasound showed echogenic kidneys, no hydronephrosis. Etiology likely secondary to dehydration from ileus. --Creatinine now rising -Have started LR at 75cc/hr -Repeat  bmet in AM  Moderate protein calorie malnutrition --DiscontinuedNG tube 2/23 --Nutritionfollowing, appreciate assistance  Hypertension --Started amlodipine 5 mg p.o. daily;patient refusing intermittently -BP stable  Anemiaof chronic disease -Stable at this time -Hemodynamically stable  Diabetes mellitus type 2 -Hemoglobin A1c 6.1 on 11/08/2018. On Lantus and sliding scale insulin q6h. -Stable at this time  Urinary retention:We will need to closely monitor.Recently had a Foley catheter  Hypokalemia Hypomagnesemia Potassium stable thus far  Enlarged abdominal aortic aneurysm Subtle finding on CT abdomen/pelvis with AAA measuring 4.6 cm in diameter. Recommend follow-up abdomen/pelvis CTA in 6 months and outpatient vascular surgery referral.  Noncompliance to treatment plan Debility, deconditioning. Intermittent. Not motivated for physical therapy. Initial PT evaluation recommended SNF placement; patient adamantly refused and wished to return home -Today, pt refused to get up out of bed, stating she is afraid to stand. No agreeable to SNF placement   DVT prophylaxis: heparin subQ Code Status: Full Family Communication: Pt in room, family at bedside Disposition Plan: SNF  Consultants:     Procedures:     Antimicrobials: Anti-infectives (From admission, onward)   Start     Dose/Rate Route Frequency Ordered Stop   11/10/18 1800  levofloxacin (LEVAQUIN) tablet 500 mg     500 mg Oral Every 48 hours 11/09/18 1457 11/10/18 2000   11/08/18 1800  levofloxacin (LEVAQUIN) tablet 750 mg  Status:  Discontinued     750 mg Oral Every 48 hours 11/08/18 0841 11/09/18 1457   11/06/18 1930  levofloxacin (LEVAQUIN) IVPB 750 mg  Status:  Discontinued     750 mg 100 mL/hr over 90 Minutes  Intravenous Every 48 hours 11/06/18 1901 11/08/18 0841   11/06/18 1900  levofloxacin (LEVAQUIN) IVPB 750 mg  Status:  Discontinued     750 mg 100 mL/hr over 90 Minutes Intravenous Every 24 hours 11/06/18 1852 11/06/18 1900      Subjective: Reports tolerating breakfast this AM  Objective: Vitals:   11/29/18 0500 11/29/18 0508 11/29/18 0959 11/29/18 1404  BP:  (!) 168/77 (!) 141/68 (!) 156/72  Pulse:  82  90  Resp:  19  20  Temp:  98.3 F (36.8 C)  98 F (36.7 C)  TempSrc:  Oral  Oral  SpO2:  100%  97%  Weight: 70.5 kg     Height:        Intake/Output Summary (Last 24 hours) at 11/29/2018 1555 Last data filed at 11/29/2018 1523 Gross per 24 hour  Intake 841.83 ml  Output 150 ml  Net 691.83 ml   Filed Weights   11/27/18 0729 11/28/18 0436 11/29/18 0500  Weight: 72.7 kg 71.7 kg 70.5 kg    Examination: General exam: Awake, laying in bed, in nad Respiratory system: Normal respiratory effort, no wheezing Cardiovascular system: regular rate, s1, s2 Gastrointestinal system: Soft, nondistended, positive BS Central nervous system: CN2-12 grossly intact, strength intact Extremities: Perfused, no clubbing Skin: Normal skin turgor, no notable skin lesions seen Psychiatry: Mood normal // no visual hallucinations   Data Reviewed: I have personally reviewed following labs and imaging studies  CBC: Recent Labs  Lab 11/24/18 0528 11/25/18 0622 11/26/18 0543 11/27/18 0553 11/28/18 0527  WBC 9.2 7.1 7.9 7.2 6.0  HGB 9.5* 9.4* 9.5* 9.1* 9.0*  HCT 31.4* 31.7* 31.9* 30.3* 30.6*  MCV 91.0 92.2 92.7 91.5 93.0  PLT 130* 121* 119* 119* 607*   Basic Metabolic Panel: Recent Labs  Lab 11/24/18 0528 11/25/18 0622 11/26/18 0543 11/27/18 0553 11/28/18 0527 11/29/18 0505  NA  140 140 139 140 139 138  K 3.5 4.9 4.5 4.7 4.6 4.7  CL 109 110 108 109 106 105  CO2 25 25 26 25 29 30   GLUCOSE 101* 99 97 86 89 102*  BUN 19 21 21 21 18 20   CREATININE 1.30* 1.28* 1.19* 1.24* 1.23* 1.53*   CALCIUM 7.9* 8.1* 8.0* 8.4* 8.3* 7.9*  MG 1.4* 1.9 1.7 1.8 1.7  --    GFR: Estimated Creatinine Clearance: 31.7 mL/min (A) (by C-G formula based on SCr of 1.53 mg/dL (H)). Liver Function Tests: No results for input(s): AST, ALT, ALKPHOS, BILITOT, PROT, ALBUMIN in the last 168 hours. No results for input(s): LIPASE, AMYLASE in the last 168 hours. No results for input(s): AMMONIA in the last 168 hours. Coagulation Profile: No results for input(s): INR, PROTIME in the last 168 hours. Cardiac Enzymes: No results for input(s): CKTOTAL, CKMB, CKMBINDEX, TROPONINI in the last 168 hours. BNP (last 3 results) Recent Labs    04/10/18 1715  PROBNP 37.0   HbA1C: No results for input(s): HGBA1C in the last 72 hours. CBG: Recent Labs  Lab 11/28/18 1139 11/28/18 1752 11/29/18 0045 11/29/18 0626 11/29/18 1156  GLUCAP 143* 111* 112* 98 162*   Lipid Profile: No results for input(s): CHOL, HDL, LDLCALC, TRIG, CHOLHDL, LDLDIRECT in the last 72 hours. Thyroid Function Tests: No results for input(s): TSH, T4TOTAL, FREET4, T3FREE, THYROIDAB in the last 72 hours. Anemia Panel: No results for input(s): VITAMINB12, FOLATE, FERRITIN, TIBC, IRON, RETICCTPCT in the last 72 hours. Sepsis Labs: No results for input(s): PROCALCITON, LATICACIDVEN in the last 168 hours.  No results found for this or any previous visit (from the past 240 hour(s)).   Radiology Studies: No results found.  Scheduled Meds: . amLODipine  5 mg Oral Daily  . docusate sodium  100 mg Oral Daily  . feeding supplement (ENSURE ENLIVE)  237 mL Oral BID BM  . fluticasone  2 spray Each Nare Daily  . heparin  5,000 Units Subcutaneous Q8H  . insulin aspart  0-9 Units Subcutaneous Q6H  . insulin glargine  10 Units Subcutaneous Daily  . mouth rinse  15 mL Mouth Rinse BID  . multivitamin  15 mL Oral Daily  . pantoprazole  40 mg Oral BID  . potassium chloride  40 mEq Oral BID  . sodium chloride flush  3 mL Intravenous Q12H  .  tamsulosin  0.4 mg Oral QPC supper   Continuous Infusions: . lactated ringers 75 mL/hr at 11/29/18 0959     LOS: 23 days   Marylu Lund, MD [facility] Pager On Amion  If 7PM-7AM, please contact night-coverage 11/29/2018, 3:55 PM

## 2018-11-29 NOTE — Progress Notes (Signed)
Hypoglycemic Event  CBG: 62  Treatment: 4 oz juice/soda  Symptoms: None  Follow-up CBG: Time:1817 CBG Result:105  Possible Reasons for Event: Unknown  Comments/MD notified:Dr. Herby Abraham, Larence Penning

## 2018-11-30 DIAGNOSIS — E870 Hyperosmolality and hypernatremia: Secondary | ICD-10-CM | POA: Diagnosis not present

## 2018-11-30 DIAGNOSIS — D72829 Elevated white blood cell count, unspecified: Secondary | ICD-10-CM | POA: Diagnosis not present

## 2018-11-30 DIAGNOSIS — J9621 Acute and chronic respiratory failure with hypoxia: Secondary | ICD-10-CM | POA: Diagnosis not present

## 2018-11-30 DIAGNOSIS — J9622 Acute and chronic respiratory failure with hypercapnia: Secondary | ICD-10-CM | POA: Diagnosis not present

## 2018-11-30 DIAGNOSIS — E875 Hyperkalemia: Secondary | ICD-10-CM | POA: Diagnosis not present

## 2018-11-30 DIAGNOSIS — R0602 Shortness of breath: Secondary | ICD-10-CM | POA: Diagnosis not present

## 2018-11-30 DIAGNOSIS — R42 Dizziness and giddiness: Secondary | ICD-10-CM | POA: Diagnosis not present

## 2018-11-30 DIAGNOSIS — Z515 Encounter for palliative care: Secondary | ICD-10-CM | POA: Diagnosis not present

## 2018-11-30 DIAGNOSIS — J9620 Acute and chronic respiratory failure, unspecified whether with hypoxia or hypercapnia: Secondary | ICD-10-CM | POA: Diagnosis not present

## 2018-11-30 DIAGNOSIS — R140 Abdominal distension (gaseous): Secondary | ICD-10-CM | POA: Diagnosis not present

## 2018-11-30 DIAGNOSIS — J441 Chronic obstructive pulmonary disease with (acute) exacerbation: Secondary | ICD-10-CM | POA: Diagnosis not present

## 2018-11-30 DIAGNOSIS — M797 Fibromyalgia: Secondary | ICD-10-CM | POA: Diagnosis not present

## 2018-11-30 DIAGNOSIS — R109 Unspecified abdominal pain: Secondary | ICD-10-CM | POA: Diagnosis not present

## 2018-11-30 DIAGNOSIS — E1169 Type 2 diabetes mellitus with other specified complication: Secondary | ICD-10-CM | POA: Diagnosis not present

## 2018-11-30 DIAGNOSIS — J9601 Acute respiratory failure with hypoxia: Secondary | ICD-10-CM | POA: Diagnosis not present

## 2018-11-30 DIAGNOSIS — K560 Paralytic ileus: Secondary | ICD-10-CM | POA: Diagnosis not present

## 2018-11-30 DIAGNOSIS — M2550 Pain in unspecified joint: Secondary | ICD-10-CM | POA: Diagnosis not present

## 2018-11-30 DIAGNOSIS — I5030 Unspecified diastolic (congestive) heart failure: Secondary | ICD-10-CM | POA: Diagnosis not present

## 2018-11-30 DIAGNOSIS — R531 Weakness: Secondary | ICD-10-CM | POA: Diagnosis not present

## 2018-11-30 DIAGNOSIS — K219 Gastro-esophageal reflux disease without esophagitis: Secondary | ICD-10-CM | POA: Diagnosis not present

## 2018-11-30 DIAGNOSIS — G4733 Obstructive sleep apnea (adult) (pediatric): Secondary | ICD-10-CM | POA: Diagnosis not present

## 2018-11-30 DIAGNOSIS — M6281 Muscle weakness (generalized): Secondary | ICD-10-CM | POA: Diagnosis not present

## 2018-11-30 DIAGNOSIS — K5900 Constipation, unspecified: Secondary | ICD-10-CM | POA: Diagnosis not present

## 2018-11-30 DIAGNOSIS — E440 Moderate protein-calorie malnutrition: Secondary | ICD-10-CM | POA: Diagnosis not present

## 2018-11-30 DIAGNOSIS — K567 Ileus, unspecified: Secondary | ICD-10-CM | POA: Diagnosis not present

## 2018-11-30 DIAGNOSIS — J449 Chronic obstructive pulmonary disease, unspecified: Secondary | ICD-10-CM | POA: Diagnosis not present

## 2018-11-30 DIAGNOSIS — R1084 Generalized abdominal pain: Secondary | ICD-10-CM | POA: Diagnosis not present

## 2018-11-30 DIAGNOSIS — I10 Essential (primary) hypertension: Secondary | ICD-10-CM | POA: Diagnosis not present

## 2018-11-30 DIAGNOSIS — N179 Acute kidney failure, unspecified: Secondary | ICD-10-CM | POA: Diagnosis not present

## 2018-11-30 DIAGNOSIS — Z9119 Patient's noncompliance with other medical treatment and regimen: Secondary | ICD-10-CM | POA: Diagnosis not present

## 2018-11-30 DIAGNOSIS — Z7401 Bed confinement status: Secondary | ICD-10-CM | POA: Diagnosis not present

## 2018-11-30 LAB — BASIC METABOLIC PANEL
Anion gap: 3 — ABNORMAL LOW (ref 5–15)
BUN: 15 mg/dL (ref 8–23)
CO2: 29 mmol/L (ref 22–32)
CREATININE: 1.19 mg/dL — AB (ref 0.44–1.00)
Calcium: 8.1 mg/dL — ABNORMAL LOW (ref 8.9–10.3)
Chloride: 106 mmol/L (ref 98–111)
GFR calc Af Amer: 53 mL/min — ABNORMAL LOW (ref >60)
GFR calc non Af Amer: 46 mL/min — ABNORMAL LOW (ref >60)
Glucose, Bld: 101 mg/dL — ABNORMAL HIGH (ref 70–99)
Potassium: 5.2 mmol/L — ABNORMAL HIGH (ref 3.5–5.1)
Sodium: 138 mmol/L (ref 135–145)

## 2018-11-30 LAB — GLUCOSE, CAPILLARY
GLUCOSE-CAPILLARY: 95 mg/dL (ref 70–99)
Glucose-Capillary: 91 mg/dL (ref 70–99)

## 2018-11-30 NOTE — Progress Notes (Signed)
D/c'd to SNF via PTAR; voices no needs at this time.

## 2018-11-30 NOTE — Clinical Social Work Placement (Addendum)
D/C Summary sent.  Nurse call report to: [phone] Room: 604A  CLINICAL SOCIAL WORK PLACEMENT  NOTE  Date:  11/30/2018  Patient Details  Name: Victoria Bird MRN: [id number] Date of Birth: 1946/05/06  Clinical Social Work is seeking post-discharge placement for this patient at the [place] level of care (*CSW will initial, date and re-position this form in  chart as items are completed):  Yes   Patient/family provided with [facility] Work Department's list of facilities offering this level of care within the geographic area requested by the patient (or if unable, by the patient's family).  Yes   Patient/family informed of their freedom to choose among providers that offer the needed level of care, that participate in Medicare, Medicaid or managed care program needed by the patient, have an available bed and are willing to accept the patient.  Yes   Patient/family informed of [facility]'s ownership interest in [facility] and [facility], as well as of the fact that they are under no obligation to receive care at these facilities.  PASRR submitted to EDS on       PASRR number received on 11/28/18     Existing PASRR number confirmed on       FL2 transmitted to all facilities in geographic area requested by pt/family on 11/28/18     FL2 transmitted to all facilities within larger geographic area on 11/28/18     Patient informed that his/her managed care company has contracts with or will negotiate with certain facilities, including the following:  [facility]     Yes   Patient/family informed of bed offers received.  Patient [facility] bed at [facility]     Physician recommends and patient chooses bed at      Patient to be [facility] on 11/30/18.  Patient to be transferred to facility by PTAR     Patient family notified on 11/30/18 of transfer.  Name of family member notified:  Son-Rick     PHYSICIAN Please  prepare priority discharge summary, including medications     Additional Comment:    _______________________________________________ Lia Hopping, LCSW 11/30/2018, 10:43 AM

## 2018-11-30 NOTE — Progress Notes (Signed)
D/ci ng to SNF, [facility], son aware per SW. Called report to [facility] at [facility]. Awaiting [facility] for transport. Voices no need sat this time.

## 2018-11-30 NOTE — Progress Notes (Signed)
PROGRESS NOTE    Victoria Bird  IRC:[id number] DOB: 1946/09/16 DOA: 11/06/2018 PCP: Hoyt Koch, MD    Brief Narrative:  73 year old female history of chronic respiratory failure on 2 L nasal cannula chronically secondary to severe COPD with very severe obstruction on spirometry of July 2019, chronic kidney disease stage III- 4, former tobacco user quit in 2019, hypertension presented to the ED with worsening shortness of breath and was admitted for an acute COPD exacerbation. Patient on admission was placed in the stepdown unit and placed on BiPAP, IV steroids, scheduled nebulizer treatments. Patient subsequently improved was taken off BiPAP and transferred to the telemetry floor. Patient still with ongoing poor air movement. On 11/29/2018 patient with worsening shortness of breath, use of accessory muscles of respiration, and subsequently transferred back to the stepdown unit to be placed on BiPAP. During stepdown stay, patient did have constipation and ileus. Surgery was consulted. Surgery recommended conservative treatment but patient had compliance issues.Subsequently patient was transferred out of the stepdown unit but developed more abdominal distention and vomiting. She was then placed on a NG tube with intermittent suction with mild improvement in her symptoms. Family requested palliative care evaluation for her ongoing symptoms.   Assessment & Plan:   Principal Problem:   Acute on chronic respiratory failure with hypoxia and hypercapnia ([name]) Active Problems:   Hypertension   GERD (gastroesophageal reflux disease)   OSA (obstructive sleep apnea)   Diabetes mellitus type 2, controlled ([name])   COPD exacerbation ([name])   Tobacco abuse   COPD with acute exacerbation ([name])   Chronic respiratory failure with hypoxia ([name])   Acute exacerbation of chronic obstructive pulmonary disease (COPD) ([name])   Malnutrition of moderate degree   ARF (acute renal failure)  ([name])   Hyperkalemia   Abdominal discomfort   Palliative care by specialist   Goals of care, counseling/discussion   Intestinal occlusion ([name])  Abdominal distention, constipation Ileus versus small bowel obstruction Hospital course complicated by patient development of a small bowel obstruction/ileus. Initially evaluated by general surgery who signed off because patient's lack of compliance to medications and treatments. Initially required NG tube with bowel decompression. Diet has slowly been transitioned and nowtolerating soft diet.Reports flatus andpos BM --CTA/P2/20w/ findings of dilated prox small bowel loops with mid bowel perienteric edema/inflammation; contrast able to migrate through terminal ileum and right colon --D/C'dNGT 2/23 --Toleratingsoft diet,successfully advanced to regular diet and tolerating --Continues to be noncompliant with PT/nursing --Palliative care following for assistance with goals of care; appreciate assistance --Encourage further mobilization, however pt refuses to move from laying position, stating she is afraid to stand -Multiple BM noted, pt is tolerating regular diet -Pt now agreeable for SNF -Recommend close outpatient follow up with [facility]  Acute on chronic hypoxic, hypercapnic respiratory failure secondary to COPD exacerbation. Human metapneumovirus Respiratory viral panel was positive for metapneumovirus. Completed 4-day course of Levaquin and steroid taper. --Continue nebs as needed --Continues on supplemental oxygen, 2LNC (on 2LNC at home); continue to titrate for SPO2 >88% -On minimal O2 support at present  Hypernatremia Sodium improved. Etiology likely secondary to IV fluids vs dehydration.  --Normalized --Cont to tolerate regular diet  Leukocytosis: Resolved Likely secondary to steroids.  --Steroid taper complete  History of GERD. Continue PPI.  Acute kidney injury on chronic kidney disease  stage III.  Renal ultrasound showed echogenic kidneys, no hydronephrosis. Etiology likely secondary to dehydration from ileus. --Creatininenow rising -Had started LR at 75cc/hr with improvement in renal function  Moderate protein calorie malnutrition --DiscontinuedNG tube 2/23 --Nutritionfollowing, appreciate assistance  Hypertension --Started amlodipine 5 mg p.o. daily;patient refusing intermittently -BP stable  Anemiaof chronic disease -Stable at this time -Hemodynamically stable  Diabetes mellitus type 2 -Hemoglobin A1c 6.1 on 11/08/2018. On Lantus and sliding scale insulin q6h. -Stable at this time  Urinary retention:We will need to closely monitor.Recently had a Foley catheter  Hypokalemia Hypomagnesemia Potassiumstable thus far  Enlarged abdominal aortic aneurysm Subtle finding on CT abdomen/pelvis with AAA measuring 4.6 cm in diameter. Recommend follow-up abdomen/pelvis CTA in 6 months and outpatient vascular surgery referral.  Noncompliance to treatment plan Debility, deconditioning. Intermittent. Not motivated for physical therapy. Initial PT evaluation recommended SNF placement; patient adamantly refused and wished to return home -Pt now agreeable for SNF for rehab  DVT prophylaxis: heparin subQ Code Status: Full Family Communication: Pt in room, family at bedside Disposition Plan: SNF  Consultants:   General Surgery  Palliative Care  Procedures:     Antimicrobials: Anti-infectives (From admission, onward)   Start     Dose/Rate Route Frequency Ordered Stop   11/10/18 1800  levofloxacin (LEVAQUIN) tablet 500 mg     500 mg Oral Every 48 hours 11/09/18 1457 11/10/18 2000   11/08/18 1800  levofloxacin (LEVAQUIN) tablet 750 mg  Status:  Discontinued     750 mg Oral Every 48 hours 11/08/18 0841 11/09/18 1457   11/06/18 1930  levofloxacin (LEVAQUIN) IVPB 750 mg  Status:  Discontinued     750 mg 100 mL/hr over 90 Minutes  Intravenous Every 48 hours 11/06/18 1901 11/08/18 0841   11/06/18 1900  levofloxacin (LEVAQUIN) IVPB 750 mg  Status:  Discontinued     750 mg 100 mL/hr over 90 Minutes Intravenous Every 24 hours 11/06/18 1852 11/06/18 1900      Subjective: Tolerating french toast for breakfast  Objective: Vitals:   11/29/18 1953 11/30/18 0410 11/30/18 0802 11/30/18 1043  BP: 134/77 (!) 154/67  (!) 154/83  Pulse: 91 76  90  Resp: 13 19  (!) 24  Temp: 98.7 F (37.1 C) 98 F (36.7 C)  98 F (36.7 C)  TempSrc: Oral Oral  Oral  SpO2: 98% 100%  98%  Weight:   70 kg   Height:        Intake/Output Summary (Last 24 hours) at 11/30/2018 1106 Last data filed at 11/30/2018 0406 Gross per 24 hour  Intake 1267.99 ml  Output 900 ml  Net 367.99 ml   Filed Weights   11/28/18 0436 11/29/18 0500 11/30/18 0802  Weight: 71.7 kg 70.5 kg 70 kg    Examination: General exam: Conversant, in no acute distress Respiratory system: normal chest rise, clear, no audible wheezing  Data Reviewed: I have personally reviewed following labs and imaging studies  CBC: Recent Labs  Lab 11/24/18 0528 11/25/18 0622 11/26/18 0543 11/27/18 0553 11/28/18 0527  WBC 9.2 7.1 7.9 7.2 6.0  HGB 9.5* 9.4* 9.5* 9.1* 9.0*  HCT 31.4* 31.7* 31.9* 30.3* 30.6*  MCV 91.0 92.2 92.7 91.5 93.0  PLT 130* 121* 119* 119* 591*   Basic Metabolic Panel: Recent Labs  Lab 11/24/18 0528 11/25/18 0622 11/26/18 0543 11/27/18 0553 11/28/18 0527 11/29/18 0505 11/30/18 0524  NA 140 140 139 140 139 138 138  K 3.5 4.9 4.5 4.7 4.6 4.7 5.2*  CL 109 110 108 109 106 105 106  CO2 25 25 26 25 29 30 29   GLUCOSE 101* 99 97 86 89 102* 101*  BUN 19 21 21 21  18  20 15  CREATININE 1.30* 1.28* 1.19* 1.24* 1.23* 1.53* 1.19*  CALCIUM 7.9* 8.1* 8.0* 8.4* 8.3* 7.9* 8.1*  MG 1.4* 1.9 1.7 1.8 1.7  --   --    GFR: Estimated Creatinine Clearance: 40.7 mL/min (A) (by C-G formula based on SCr of 1.19 mg/dL (H)). Liver Function Tests: No results for  input(s): AST, ALT, ALKPHOS, BILITOT, PROT, ALBUMIN in the last 168 hours. No results for input(s): LIPASE, AMYLASE in the last 168 hours. No results for input(s): AMMONIA in the last 168 hours. Coagulation Profile: No results for input(s): INR, PROTIME in the last 168 hours. Cardiac Enzymes: No results for input(s): CKTOTAL, CKMB, CKMBINDEX, TROPONINI in the last 168 hours. BNP (last 3 results) Recent Labs    04/10/18 1715  PROBNP 37.0   HbA1C: No results for input(s): HGBA1C in the last 72 hours. CBG: Recent Labs  Lab 11/29/18 1740 11/29/18 1817 11/29/18 1951 11/30/18 0015 11/30/18 0531  GLUCAP 62* 105* 125* 91 95   Lipid Profile: No results for input(s): CHOL, HDL, LDLCALC, TRIG, CHOLHDL, LDLDIRECT in the last 72 hours. Thyroid Function Tests: No results for input(s): TSH, T4TOTAL, FREET4, T3FREE, THYROIDAB in the last 72 hours. Anemia Panel: No results for input(s): VITAMINB12, FOLATE, FERRITIN, TIBC, IRON, RETICCTPCT in the last 72 hours. Sepsis Labs: No results for input(s): PROCALCITON, LATICACIDVEN in the last 168 hours.  No results found for this or any previous visit (from the past 240 hour(s)).   Radiology Studies: No results found.  Scheduled Meds: . amLODipine  5 mg Oral Daily  . docusate sodium  100 mg Oral Daily  . feeding supplement (ENSURE ENLIVE)  237 mL Oral BID BM  . fluticasone  2 spray Each Nare Daily  . heparin  5,000 Units Subcutaneous Q8H  . insulin aspart  0-9 Units Subcutaneous Q6H  . insulin glargine  10 Units Subcutaneous Daily  . mouth rinse  15 mL Mouth Rinse BID  . multivitamin  15 mL Oral Daily  . pantoprazole  40 mg Oral BID  . sodium chloride flush  3 mL Intravenous Q12H  . tamsulosin  0.4 mg Oral QPC supper   Continuous Infusions: . lactated ringers 50 mL/hr at 11/30/18 4481     LOS: 24 days   Marylu Lund, MD [facility] Pager On Amion  If 7PM-7AM, please contact night-coverage 11/30/2018, 11:06 AM

## 2018-12-01 LAB — GLUCOSE, CAPILLARY: Glucose-Capillary: 150 mg/dL — ABNORMAL HIGH (ref 70–99)

## 2018-12-02 ENCOUNTER — Telehealth: Payer: Self-pay | Admitting: *Deleted

## 2018-12-02 DIAGNOSIS — R140 Abdominal distension (gaseous): Secondary | ICD-10-CM | POA: Diagnosis not present

## 2018-12-02 DIAGNOSIS — K5900 Constipation, unspecified: Secondary | ICD-10-CM | POA: Diagnosis not present

## 2018-12-02 DIAGNOSIS — J449 Chronic obstructive pulmonary disease, unspecified: Secondary | ICD-10-CM | POA: Diagnosis not present

## 2018-12-02 DIAGNOSIS — J9620 Acute and chronic respiratory failure, unspecified whether with hypoxia or hypercapnia: Secondary | ICD-10-CM | POA: Diagnosis not present

## 2018-12-02 DIAGNOSIS — E870 Hyperosmolality and hypernatremia: Secondary | ICD-10-CM | POA: Diagnosis not present

## 2018-12-02 NOTE — Telephone Encounter (Signed)
Pt was on TCM report admitted 11/06/18 for worsening shortness of breath and acute COPD exacerbation.  Pt was  placed on the stepdown unit and placed on BiPAP, IV steroids, scheduled nebulizer treatments.  PT evaluation recommended SNF placement; patient adamantly refused and wished to return home. Pt did change her mind and agreed SNF for rehab. Pt D/c 11/30/18 to SNF. Per summary will need to see PCP 1-2 weeks after leaving SNF.Marland KitchenJohny Chess

## 2018-12-03 ENCOUNTER — Non-Acute Institutional Stay: Payer: Medicare Other | Admitting: Internal Medicine

## 2018-12-03 DIAGNOSIS — J9620 Acute and chronic respiratory failure, unspecified whether with hypoxia or hypercapnia: Secondary | ICD-10-CM | POA: Diagnosis not present

## 2018-12-03 DIAGNOSIS — M6281 Muscle weakness (generalized): Secondary | ICD-10-CM | POA: Diagnosis not present

## 2018-12-03 DIAGNOSIS — J449 Chronic obstructive pulmonary disease, unspecified: Secondary | ICD-10-CM | POA: Diagnosis not present

## 2018-12-03 DIAGNOSIS — R1084 Generalized abdominal pain: Secondary | ICD-10-CM | POA: Diagnosis not present

## 2018-12-03 DIAGNOSIS — K219 Gastro-esophageal reflux disease without esophagitis: Secondary | ICD-10-CM | POA: Diagnosis not present

## 2018-12-06 DIAGNOSIS — J449 Chronic obstructive pulmonary disease, unspecified: Secondary | ICD-10-CM | POA: Diagnosis not present

## 2018-12-06 DIAGNOSIS — K5900 Constipation, unspecified: Secondary | ICD-10-CM | POA: Diagnosis not present

## 2018-12-06 DIAGNOSIS — M6281 Muscle weakness (generalized): Secondary | ICD-10-CM | POA: Diagnosis not present

## 2018-12-06 DIAGNOSIS — J9620 Acute and chronic respiratory failure, unspecified whether with hypoxia or hypercapnia: Secondary | ICD-10-CM | POA: Diagnosis not present

## 2018-12-06 DIAGNOSIS — D72829 Elevated white blood cell count, unspecified: Secondary | ICD-10-CM | POA: Diagnosis not present

## 2018-12-16 DIAGNOSIS — K56609 Unspecified intestinal obstruction, unspecified as to partial versus complete obstruction: Secondary | ICD-10-CM | POA: Diagnosis not present

## 2018-12-16 DIAGNOSIS — M6281 Muscle weakness (generalized): Secondary | ICD-10-CM | POA: Diagnosis not present

## 2018-12-16 DIAGNOSIS — J9620 Acute and chronic respiratory failure, unspecified whether with hypoxia or hypercapnia: Secondary | ICD-10-CM | POA: Diagnosis not present

## 2018-12-16 DIAGNOSIS — J441 Chronic obstructive pulmonary disease with (acute) exacerbation: Secondary | ICD-10-CM | POA: Diagnosis not present

## 2018-12-17 DIAGNOSIS — J441 Chronic obstructive pulmonary disease with (acute) exacerbation: Secondary | ICD-10-CM | POA: Diagnosis not present

## 2018-12-17 DIAGNOSIS — J9620 Acute and chronic respiratory failure, unspecified whether with hypoxia or hypercapnia: Secondary | ICD-10-CM | POA: Diagnosis not present

## 2018-12-17 DIAGNOSIS — M6281 Muscle weakness (generalized): Secondary | ICD-10-CM | POA: Diagnosis not present

## 2018-12-17 DIAGNOSIS — K56609 Unspecified intestinal obstruction, unspecified as to partial versus complete obstruction: Secondary | ICD-10-CM | POA: Diagnosis not present

## 2018-12-18 DIAGNOSIS — D72829 Elevated white blood cell count, unspecified: Secondary | ICD-10-CM | POA: Diagnosis not present

## 2018-12-18 DIAGNOSIS — J449 Chronic obstructive pulmonary disease, unspecified: Secondary | ICD-10-CM | POA: Diagnosis not present

## 2018-12-18 DIAGNOSIS — K5900 Constipation, unspecified: Secondary | ICD-10-CM | POA: Diagnosis not present

## 2018-12-18 DIAGNOSIS — J9620 Acute and chronic respiratory failure, unspecified whether with hypoxia or hypercapnia: Secondary | ICD-10-CM | POA: Diagnosis not present

## 2018-12-18 DIAGNOSIS — M6281 Muscle weakness (generalized): Secondary | ICD-10-CM | POA: Diagnosis not present

## 2018-12-18 DIAGNOSIS — J441 Chronic obstructive pulmonary disease with (acute) exacerbation: Secondary | ICD-10-CM | POA: Diagnosis not present

## 2018-12-18 DIAGNOSIS — K56609 Unspecified intestinal obstruction, unspecified as to partial versus complete obstruction: Secondary | ICD-10-CM | POA: Diagnosis not present

## 2018-12-19 DIAGNOSIS — J449 Chronic obstructive pulmonary disease, unspecified: Secondary | ICD-10-CM | POA: Diagnosis not present

## 2018-12-19 DIAGNOSIS — J168 Pneumonia due to other specified infectious organisms: Secondary | ICD-10-CM | POA: Diagnosis not present

## 2018-12-19 DIAGNOSIS — R269 Unspecified abnormalities of gait and mobility: Secondary | ICD-10-CM | POA: Diagnosis not present

## 2018-12-19 DIAGNOSIS — J441 Chronic obstructive pulmonary disease with (acute) exacerbation: Secondary | ICD-10-CM | POA: Diagnosis not present

## 2019-01-03 ENCOUNTER — Other Ambulatory Visit: Payer: Self-pay | Admitting: Internal Medicine

## 2019-01-07 ENCOUNTER — Telehealth: Payer: Self-pay | Admitting: Internal Medicine

## 2019-01-07 DIAGNOSIS — E0800 Diabetes mellitus due to underlying condition with hyperosmolarity without nonketotic hyperglycemic-hyperosmolar coma (NKHHC): Secondary | ICD-10-CM

## 2019-01-08 ENCOUNTER — Telehealth: Payer: Self-pay | Admitting: Internal Medicine

## 2019-01-08 NOTE — Telephone Encounter (Signed)
Patient is requesting call back in regards to refill denial.

## 2019-01-08 NOTE — Telephone Encounter (Signed)
Patient called to check the status of her refill request.  She does not understand why her medication was refused.  Please advise.

## 2019-01-09 ENCOUNTER — Encounter: Payer: Self-pay | Admitting: Internal Medicine

## 2019-01-09 ENCOUNTER — Ambulatory Visit (INDEPENDENT_AMBULATORY_CARE_PROVIDER_SITE_OTHER): Payer: Medicare Other | Admitting: Internal Medicine

## 2019-01-09 DIAGNOSIS — E1121 Type 2 diabetes mellitus with diabetic nephropathy: Secondary | ICD-10-CM

## 2019-01-09 DIAGNOSIS — N183 Chronic kidney disease, stage 3 unspecified: Secondary | ICD-10-CM

## 2019-01-09 DIAGNOSIS — J9611 Chronic respiratory failure with hypoxia: Secondary | ICD-10-CM

## 2019-01-09 DIAGNOSIS — J449 Chronic obstructive pulmonary disease, unspecified: Secondary | ICD-10-CM

## 2019-01-09 NOTE — Telephone Encounter (Signed)
Doxy appt set up

## 2019-01-09 NOTE — Assessment & Plan Note (Signed)
We talked about how her GFR is borderline to restart metformin and given normal HgA1c for some years I would be reluctant to start this back without some high sugars or evidence for need. Will see her back in the office in 1-2 months for HgA1c and CMP.

## 2019-01-09 NOTE — Telephone Encounter (Signed)
Is patient supposed to be on metformin?

## 2019-01-09 NOTE — Progress Notes (Signed)
Virtual Visit via Video Note  I connected with Victoria Bird on 01/09/19 at  1:20 PM EDT by a video enabled telemedicine application and verified that I am speaking with the correct person using two identifiers.   I discussed the limitations of evaluation and management by telemedicine and the availability of in person appointments. The patient expressed understanding and agreed to proceed.  History of Present Illness: The patient is a 73 y.o.  female with visit for hospital follow up (in for severe COPD exacerbation, was on bipap several times, had ileus during the hospital stay and NG tube, surgery advised conservative approach, went to SNF on discharge). Metformin was held during the hospital and GFR was down during the stay. She has been since discharge not taking the metformin. Left the SNF about 3 weeks ago, she cannot recall date. Denies being out of medication. Is not staying at her house so is staying with family. She is using walker to get around and can get to the bathroom without getting SOB. Still using oxygen constantly which helps some. Overall feels that breathing is not improving much since hospital and over the last 1-2 years has declined significantly. Last HgA1c 6.1 in February. Denies chest pains or fevers or chills. Denies body aches. Denies bowel problems or stomach pain. Denies blood in stool. Has been taking other medications without missing except metformin. Checking sugars in the morning and has not noticed significant change but cannot call out the numbers. Denies low sugar readings. Overall it is stable.   PMH, [place], social history reviewed and updated  Observations/Objective: Appearance: chronically ill, breathing appears stable, oxygen on, casual grooming, abdomen does not appear distended, throat normal, mental status is A and O times 3, less wandering historian than prior  Assessment and Plan: See problem oriented charting  Follow Up Instructions: return in 1-2  months for visit and labs to assess diabetes and breathing  I discussed the assessment and treatment plan with the patient. The patient was provided an opportunity to ask questions and all were answered. The patient agreed with the plan and demonstrated an understanding of the instructions.   The patient was advised to call back or seek an in-person evaluation if the symptoms worsen or if the condition fails to improve as anticipated.  Hoyt Koch, MD

## 2019-01-09 NOTE — Assessment & Plan Note (Signed)
Severe and using her inhalers and oxygen with stable breathing. This is not improving much and QOL is suffering some from this change over the last 1-2 years. No current exacerbation at this time.

## 2019-01-09 NOTE — Telephone Encounter (Signed)
This is being taken care of in another phone note

## 2019-01-09 NOTE — Assessment & Plan Note (Signed)
She is off metformin at this time and last HgA1c 6.1. Would monitor off metformin at this time given reduction in GFR overall in the last 1 year. Explained this to her. Foot exam will be done at follow up in 1-2 months.

## 2019-01-09 NOTE — Assessment & Plan Note (Signed)
Using oxygen still and breathing is stable but poor overall.

## 2019-01-09 NOTE — Telephone Encounter (Signed)
It is not on her medication list and would need follow up with her to see if still appropriate. Any way to do a virtual visit.

## 2019-01-09 NOTE — Telephone Encounter (Signed)
noted 

## 2019-01-19 DIAGNOSIS — J449 Chronic obstructive pulmonary disease, unspecified: Secondary | ICD-10-CM | POA: Diagnosis not present

## 2019-01-19 DIAGNOSIS — J441 Chronic obstructive pulmonary disease with (acute) exacerbation: Secondary | ICD-10-CM | POA: Diagnosis not present

## 2019-01-19 DIAGNOSIS — R269 Unspecified abnormalities of gait and mobility: Secondary | ICD-10-CM | POA: Diagnosis not present

## 2019-01-19 DIAGNOSIS — J168 Pneumonia due to other specified infectious organisms: Secondary | ICD-10-CM | POA: Diagnosis not present

## 2019-02-15 IMAGING — US US RENAL
1 series · 14 of 25 positions shown · non-contrast
Comparison: CT of the abdomen and pelvis on 12/16/2016

CLINICAL DATA: Acute renal failure.

EXAM:
RENAL / URINARY TRACT ULTRASOUND COMPLETE

[Series 1: us renal · 14 of 33 slices shown]
[im 1/33]
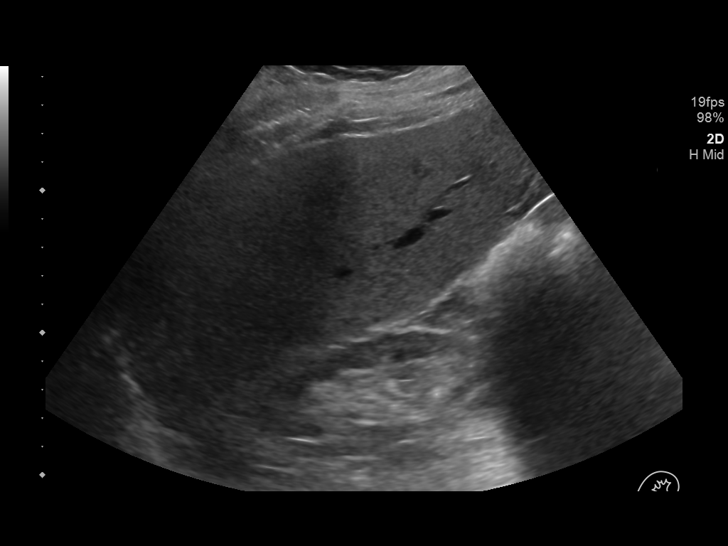
[im 3/33]
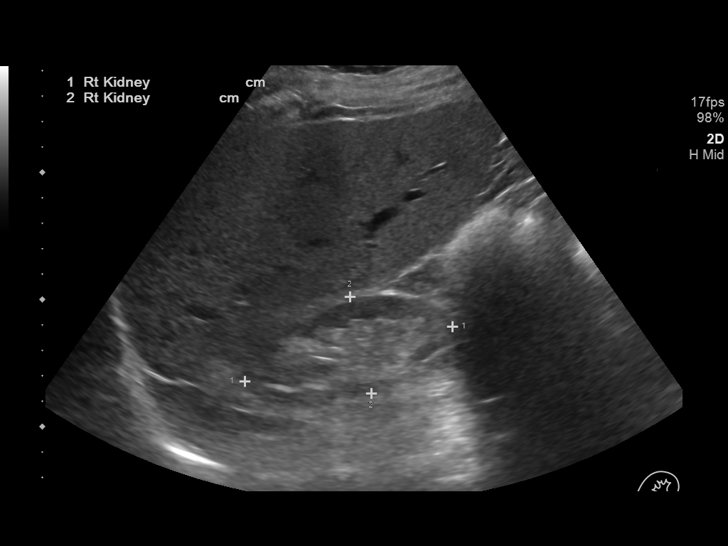
[im 6/33]
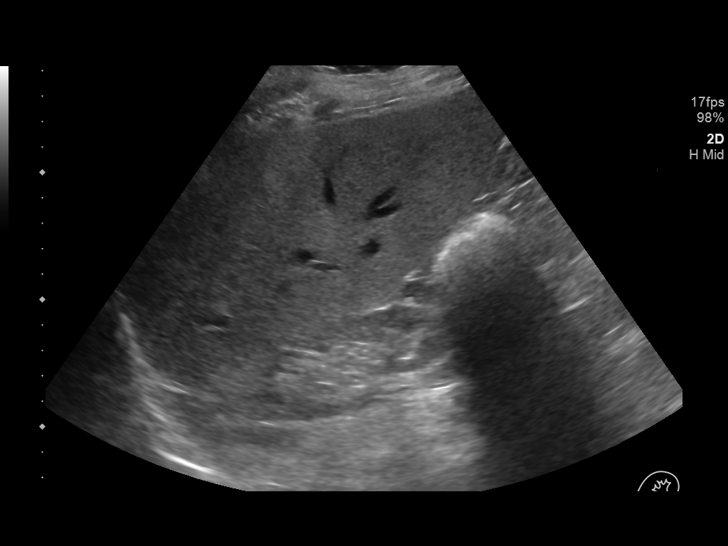
[im 9/33]
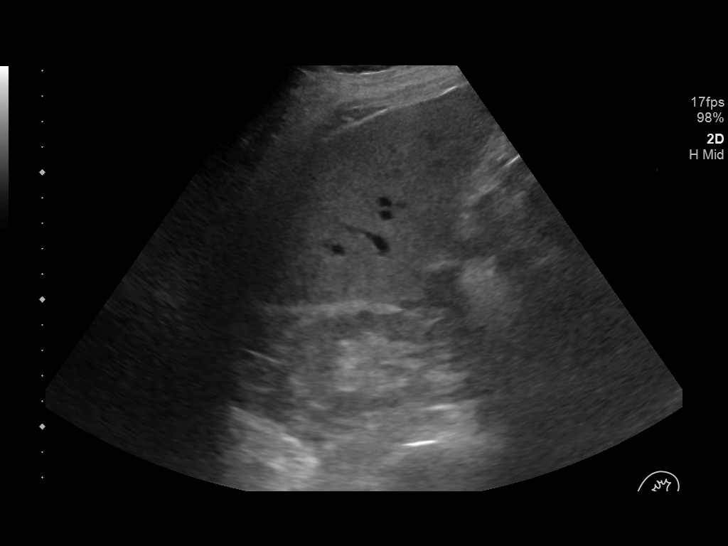
[im 11/33]
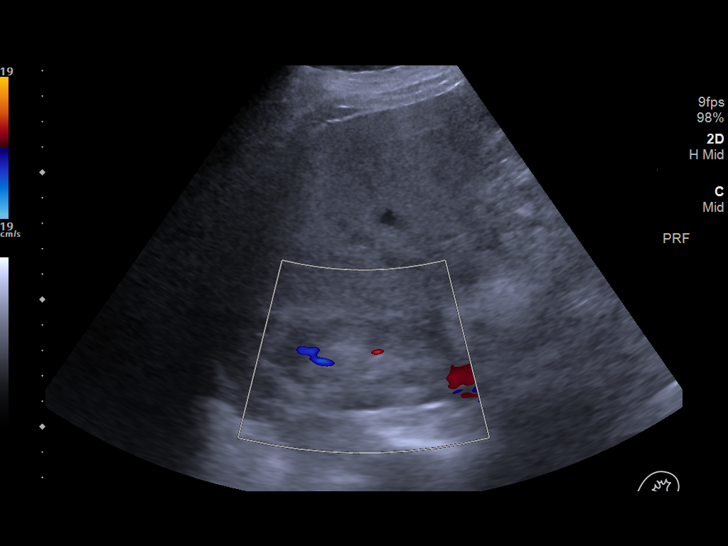
[im 13/33]
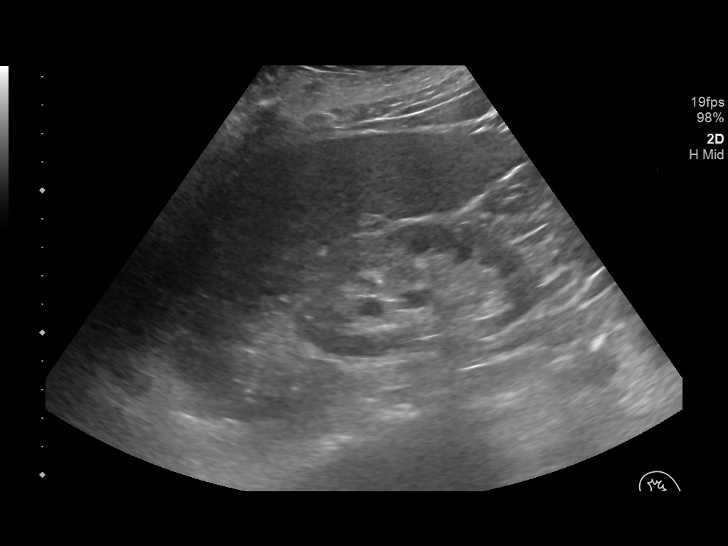
[im 15/33]
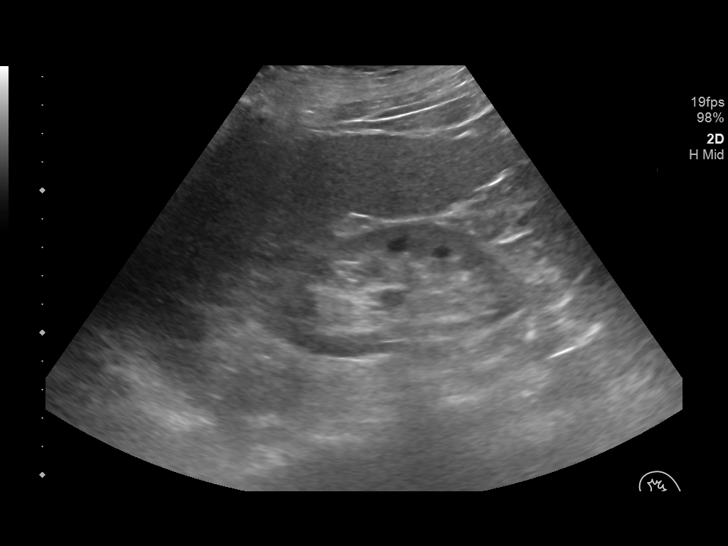
[im 18/33]
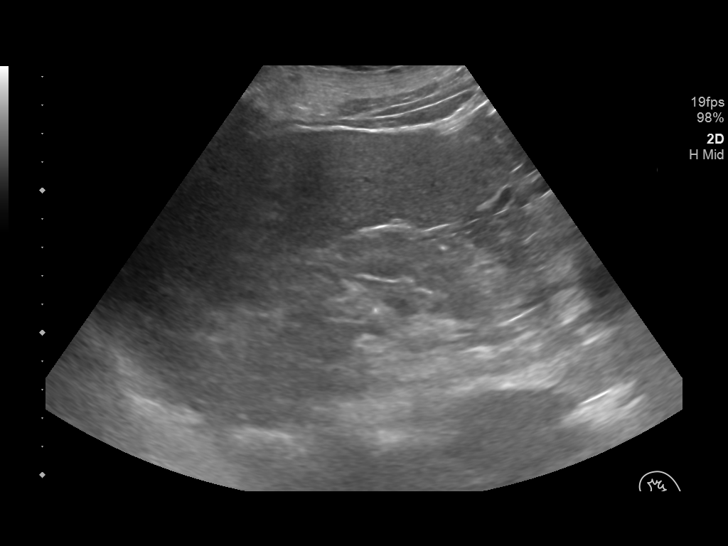
[im 21/33]
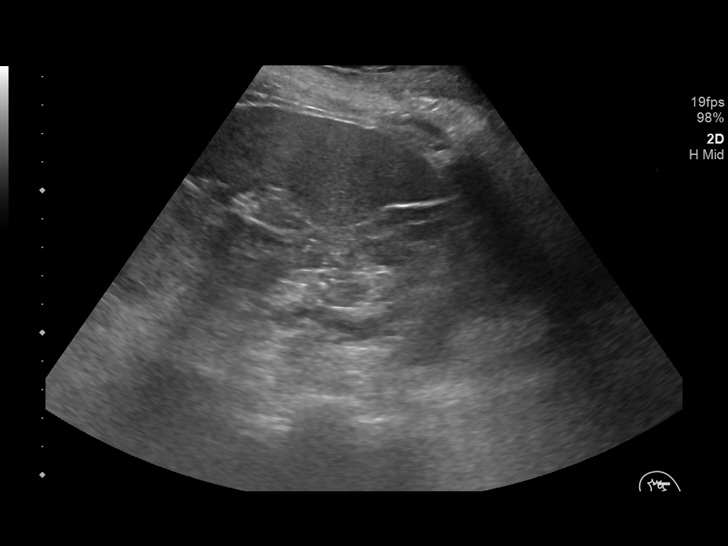
[im 22/33]
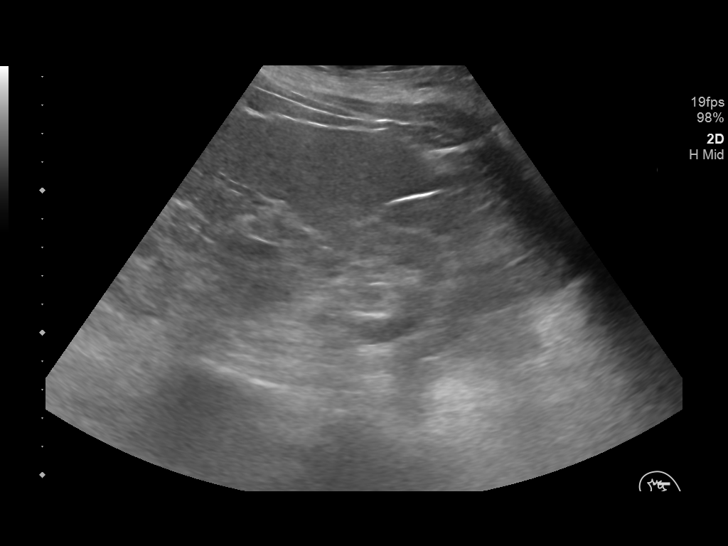
[im 25/33]
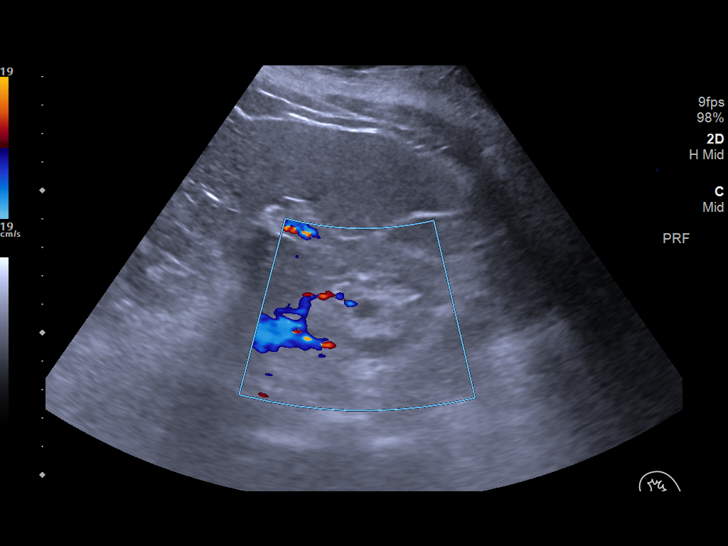
[im 27/33]
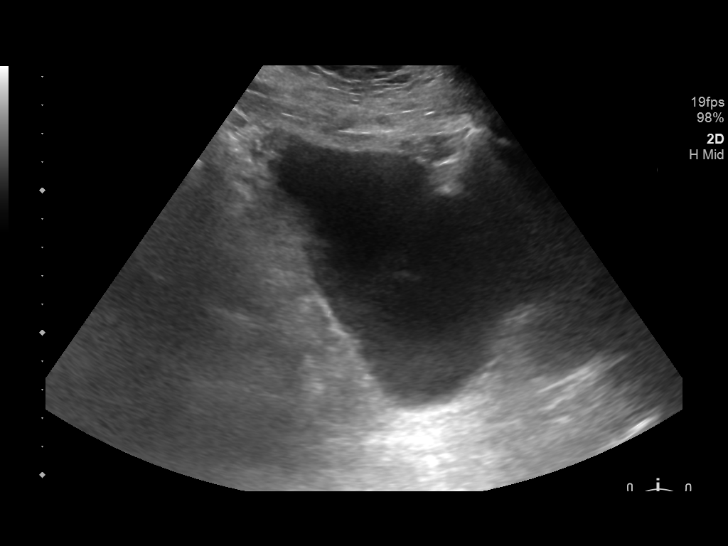
[im 30/33]
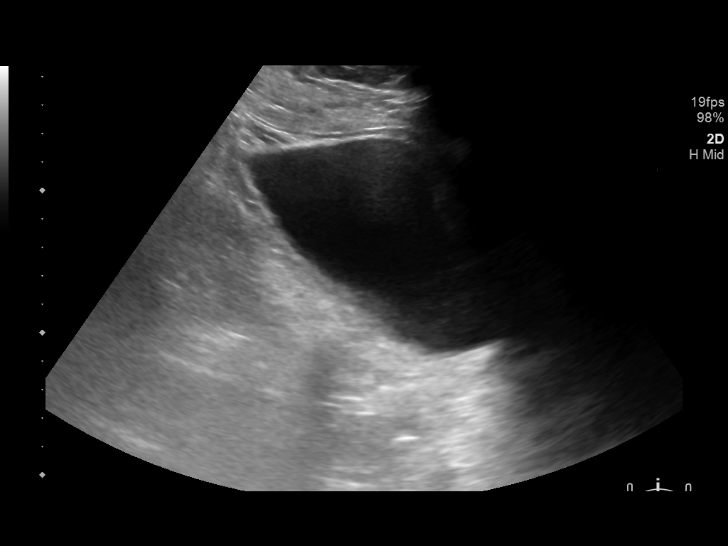
[im 33/33]
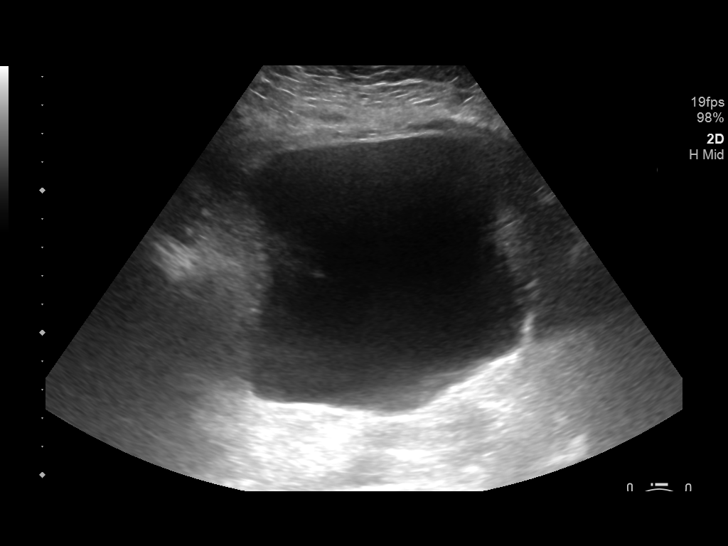

[14 of 25 positions shown; findings below may reference images not displayed]

FINDINGS: Right Kidney:

Renal measurements: 8.4 x 3.9 x 5.6 centimeters = volume: 96.6 mL.
Renal parenchyma is echogenic and thin. No suspicious mass. No
hydronephrosis.

Left Kidney:

Renal measurements: 8.6 x 4.5 x 4.8 centimeters = volume: 98.6 mL.
Echogenic renal parenchyma and thin. No suspicious renal mass or
hydronephrosis.

Bladder:

Appears normal for degree of bladder distention.
IMPRESSION: Echogenic, thin renal parenchyma bilaterally. No hydronephrosis or
suspicious renal mass.

## 2019-02-15 IMAGING — DX DG CHEST 1V PORT
1 series · 1 of 1 positions shown · non-contrast
Comparison: Chest x-ray 11/08/2018.

CLINICAL DATA: 72-year-old female with history of coughing and
shortness of breath.

EXAM:
PORTABLE CHEST 1 VIEW

[chest ap]
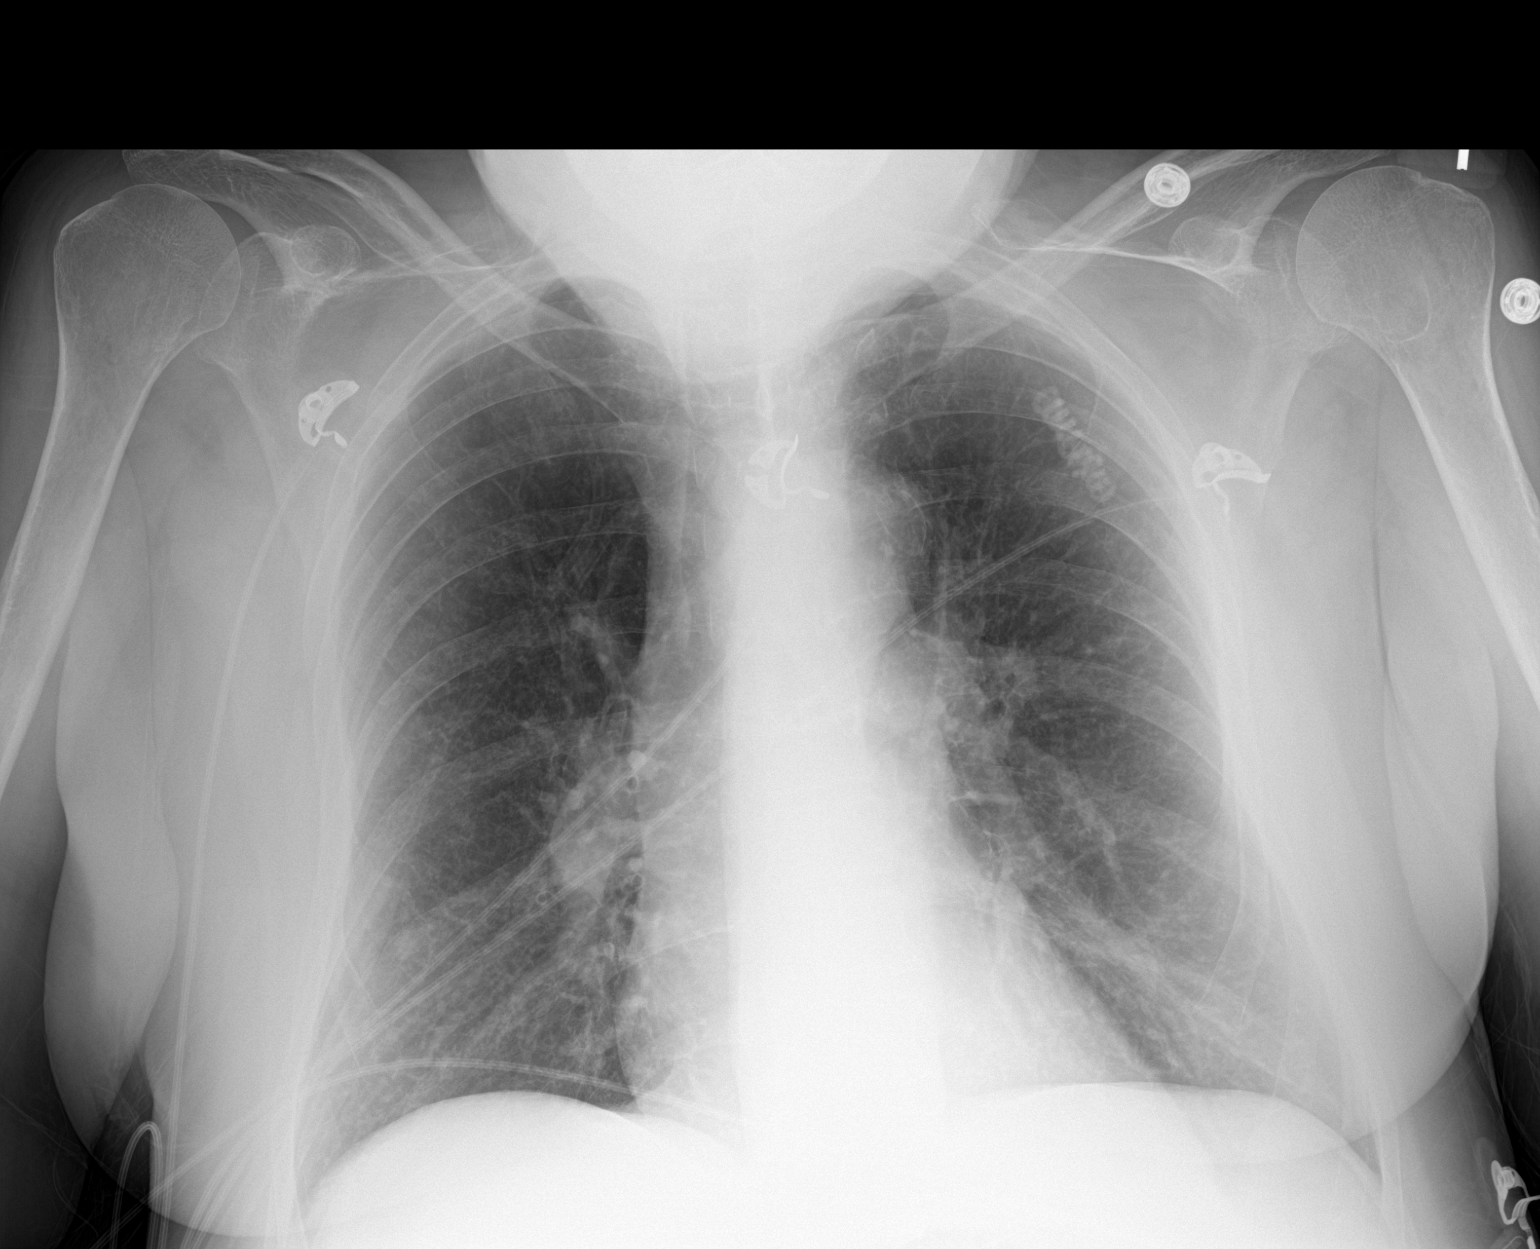

[1 of 1 positions shown; findings below may reference images not displayed]

FINDINGS: Lung volumes are normal. No consolidative airspace disease. No
pleural effusions. No pneumothorax. No pulmonary nodule or mass
noted. Pulmonary vasculature and the cardiomediastinal silhouette
are within normal limits. Aortic atherosclerosis.
IMPRESSION: 1. No radiographic evidence of acute cardiopulmonary disease.
2. Aortic atherosclerosis.

## 2019-02-18 DIAGNOSIS — J441 Chronic obstructive pulmonary disease with (acute) exacerbation: Secondary | ICD-10-CM | POA: Diagnosis not present

## 2019-02-18 DIAGNOSIS — R269 Unspecified abnormalities of gait and mobility: Secondary | ICD-10-CM | POA: Diagnosis not present

## 2019-02-18 DIAGNOSIS — J168 Pneumonia due to other specified infectious organisms: Secondary | ICD-10-CM | POA: Diagnosis not present

## 2019-02-18 DIAGNOSIS — J449 Chronic obstructive pulmonary disease, unspecified: Secondary | ICD-10-CM | POA: Diagnosis not present

## 2019-03-21 DIAGNOSIS — R269 Unspecified abnormalities of gait and mobility: Secondary | ICD-10-CM | POA: Diagnosis not present

## 2019-03-21 DIAGNOSIS — J449 Chronic obstructive pulmonary disease, unspecified: Secondary | ICD-10-CM | POA: Diagnosis not present

## 2019-03-21 DIAGNOSIS — J168 Pneumonia due to other specified infectious organisms: Secondary | ICD-10-CM | POA: Diagnosis not present

## 2019-03-21 DIAGNOSIS — J441 Chronic obstructive pulmonary disease with (acute) exacerbation: Secondary | ICD-10-CM | POA: Diagnosis not present

## 2019-04-09 ENCOUNTER — Other Ambulatory Visit: Payer: Self-pay | Admitting: Internal Medicine

## 2019-04-20 DIAGNOSIS — J449 Chronic obstructive pulmonary disease, unspecified: Secondary | ICD-10-CM | POA: Diagnosis not present

## 2019-04-20 DIAGNOSIS — R269 Unspecified abnormalities of gait and mobility: Secondary | ICD-10-CM | POA: Diagnosis not present

## 2019-04-20 DIAGNOSIS — J168 Pneumonia due to other specified infectious organisms: Secondary | ICD-10-CM | POA: Diagnosis not present

## 2019-04-20 DIAGNOSIS — J441 Chronic obstructive pulmonary disease with (acute) exacerbation: Secondary | ICD-10-CM | POA: Diagnosis not present

## 2019-05-21 DIAGNOSIS — J449 Chronic obstructive pulmonary disease, unspecified: Secondary | ICD-10-CM | POA: Diagnosis not present

## 2019-05-21 DIAGNOSIS — J441 Chronic obstructive pulmonary disease with (acute) exacerbation: Secondary | ICD-10-CM | POA: Diagnosis not present

## 2019-05-21 DIAGNOSIS — R269 Unspecified abnormalities of gait and mobility: Secondary | ICD-10-CM | POA: Diagnosis not present

## 2019-05-21 DIAGNOSIS — J168 Pneumonia due to other specified infectious organisms: Secondary | ICD-10-CM | POA: Diagnosis not present

## 2019-05-22 ENCOUNTER — Other Ambulatory Visit: Payer: Self-pay | Admitting: Internal Medicine

## 2019-05-23 NOTE — Telephone Encounter (Signed)
Please call her and schedule OV with Dr. Sharlet Salina; was due in June/ July.

## 2019-05-26 NOTE — Telephone Encounter (Signed)
Can you please schedule patient for an appointment. Thank you

## 2019-05-26 NOTE — Telephone Encounter (Signed)
Do you need to have a follow up visit with patient? If so is virtual okay?

## 2019-05-26 NOTE — Telephone Encounter (Signed)
Pt is currently in [facility] with her son so she can have someone with her all the time, her son is helping her build up her strength after being in [place]. Would you like virtual visit?

## 2019-05-26 NOTE — Telephone Encounter (Signed)
She was supposed to come back in May/June for visit with labs so should have when able.

## 2019-05-28 NOTE — Telephone Encounter (Signed)
Pt refused appointment stating there is no one that can bring her and she is 4 hours away. Pt notified if she continues to not be seen her medications can be refused. She expressed understanding and will try a doctor in [place].

## 2019-05-29 NOTE — Telephone Encounter (Signed)
noted 

## 2019-06-21 DIAGNOSIS — R269 Unspecified abnormalities of gait and mobility: Secondary | ICD-10-CM | POA: Diagnosis not present

## 2019-06-21 DIAGNOSIS — J441 Chronic obstructive pulmonary disease with (acute) exacerbation: Secondary | ICD-10-CM | POA: Diagnosis not present

## 2019-06-21 DIAGNOSIS — J449 Chronic obstructive pulmonary disease, unspecified: Secondary | ICD-10-CM | POA: Diagnosis not present

## 2019-06-21 DIAGNOSIS — J168 Pneumonia due to other specified infectious organisms: Secondary | ICD-10-CM | POA: Diagnosis not present

## 2019-07-07 DIAGNOSIS — I10 Essential (primary) hypertension: Secondary | ICD-10-CM | POA: Diagnosis not present

## 2019-07-07 DIAGNOSIS — J449 Chronic obstructive pulmonary disease, unspecified: Secondary | ICD-10-CM | POA: Diagnosis not present

## 2019-07-07 DIAGNOSIS — E1165 Type 2 diabetes mellitus with hyperglycemia: Secondary | ICD-10-CM | POA: Diagnosis not present

## 2019-07-07 DIAGNOSIS — M62838 Other muscle spasm: Secondary | ICD-10-CM | POA: Diagnosis not present

## 2019-07-08 DIAGNOSIS — E1165 Type 2 diabetes mellitus with hyperglycemia: Secondary | ICD-10-CM | POA: Diagnosis not present

## 2019-07-21 DIAGNOSIS — J168 Pneumonia due to other specified infectious organisms: Secondary | ICD-10-CM | POA: Diagnosis not present

## 2019-07-21 DIAGNOSIS — R269 Unspecified abnormalities of gait and mobility: Secondary | ICD-10-CM | POA: Diagnosis not present

## 2019-07-21 DIAGNOSIS — J449 Chronic obstructive pulmonary disease, unspecified: Secondary | ICD-10-CM | POA: Diagnosis not present

## 2019-07-21 DIAGNOSIS — J441 Chronic obstructive pulmonary disease with (acute) exacerbation: Secondary | ICD-10-CM | POA: Diagnosis not present

## 2019-08-07 ENCOUNTER — Other Ambulatory Visit: Payer: Self-pay | Admitting: Internal Medicine

## 2019-08-21 DIAGNOSIS — J449 Chronic obstructive pulmonary disease, unspecified: Secondary | ICD-10-CM | POA: Diagnosis not present

## 2019-08-21 DIAGNOSIS — R269 Unspecified abnormalities of gait and mobility: Secondary | ICD-10-CM | POA: Diagnosis not present

## 2019-08-21 DIAGNOSIS — J441 Chronic obstructive pulmonary disease with (acute) exacerbation: Secondary | ICD-10-CM | POA: Diagnosis not present

## 2019-08-21 DIAGNOSIS — J168 Pneumonia due to other specified infectious organisms: Secondary | ICD-10-CM | POA: Diagnosis not present

## 2019-09-03 ENCOUNTER — Other Ambulatory Visit: Payer: Self-pay | Admitting: Family

## 2019-09-05 ENCOUNTER — Other Ambulatory Visit: Payer: Self-pay | Admitting: Family

## 2019-09-20 DIAGNOSIS — J441 Chronic obstructive pulmonary disease with (acute) exacerbation: Secondary | ICD-10-CM | POA: Diagnosis not present

## 2019-09-20 DIAGNOSIS — R269 Unspecified abnormalities of gait and mobility: Secondary | ICD-10-CM | POA: Diagnosis not present

## 2019-09-20 DIAGNOSIS — J449 Chronic obstructive pulmonary disease, unspecified: Secondary | ICD-10-CM | POA: Diagnosis not present

## 2019-09-20 DIAGNOSIS — J168 Pneumonia due to other specified infectious organisms: Secondary | ICD-10-CM | POA: Diagnosis not present

## 2019-11-03 DEATH — deceased
